# Patient Record
Sex: Male | Born: 1950
Health system: Southern US, Community
[De-identification: ages and names within clinical notes are randomized; demographics above are authoritative.]

## PROBLEM LIST (undated history)

## (undated) DIAGNOSIS — I4510 Unspecified right bundle-branch block: Secondary | ICD-10-CM

## (undated) DIAGNOSIS — I10 Essential (primary) hypertension: Secondary | ICD-10-CM

## (undated) DIAGNOSIS — I739 Peripheral vascular disease, unspecified: Secondary | ICD-10-CM

## (undated) DIAGNOSIS — M1990 Unspecified osteoarthritis, unspecified site: Secondary | ICD-10-CM

## (undated) DIAGNOSIS — G4730 Sleep apnea, unspecified: Secondary | ICD-10-CM

## (undated) DIAGNOSIS — N529 Male erectile dysfunction, unspecified: Secondary | ICD-10-CM

## (undated) DIAGNOSIS — M1710 Unilateral primary osteoarthritis, unspecified knee: Secondary | ICD-10-CM

## (undated) DIAGNOSIS — I2510 Atherosclerotic heart disease of native coronary artery without angina pectoris: Secondary | ICD-10-CM

## (undated) DIAGNOSIS — R972 Elevated prostate specific antigen [PSA]: Secondary | ICD-10-CM

## (undated) DIAGNOSIS — N201 Calculus of ureter: Secondary | ICD-10-CM

## (undated) DIAGNOSIS — Z794 Long term (current) use of insulin: Secondary | ICD-10-CM

## (undated) DIAGNOSIS — E119 Type 2 diabetes mellitus without complications: Secondary | ICD-10-CM

## (undated) DIAGNOSIS — N210 Calculus in bladder: Secondary | ICD-10-CM

## (undated) DIAGNOSIS — C649 Malignant neoplasm of unspecified kidney, except renal pelvis: Secondary | ICD-10-CM

## (undated) DIAGNOSIS — Z87442 Personal history of urinary calculi: Secondary | ICD-10-CM

## (undated) DIAGNOSIS — I480 Paroxysmal atrial fibrillation: Secondary | ICD-10-CM

## (undated) DIAGNOSIS — Z87448 Personal history of other diseases of urinary system: Secondary | ICD-10-CM

## (undated) DIAGNOSIS — I440 Atrioventricular block, first degree: Secondary | ICD-10-CM

## (undated) DIAGNOSIS — F419 Anxiety disorder, unspecified: Secondary | ICD-10-CM

## (undated) DIAGNOSIS — I428 Other cardiomyopathies: Secondary | ICD-10-CM

## (undated) DIAGNOSIS — Z973 Presence of spectacles and contact lenses: Secondary | ICD-10-CM

## (undated) DIAGNOSIS — I77810 Thoracic aortic ectasia: Secondary | ICD-10-CM

## (undated) DIAGNOSIS — R011 Cardiac murmur, unspecified: Secondary | ICD-10-CM

## (undated) DIAGNOSIS — R399 Unspecified symptoms and signs involving the genitourinary system: Secondary | ICD-10-CM

## (undated) DIAGNOSIS — C4371 Malignant melanoma of right lower limb, including hip: Secondary | ICD-10-CM

## (undated) DIAGNOSIS — E782 Mixed hyperlipidemia: Secondary | ICD-10-CM

## (undated) DIAGNOSIS — Z9889 Other specified postprocedural states: Secondary | ICD-10-CM

## (undated) DIAGNOSIS — I4892 Unspecified atrial flutter: Secondary | ICD-10-CM

## (undated) DIAGNOSIS — Z7901 Long term (current) use of anticoagulants: Secondary | ICD-10-CM

## (undated) DIAGNOSIS — Q6102 Congenital multiple renal cysts: Secondary | ICD-10-CM

## (undated) DIAGNOSIS — Z85820 Personal history of malignant melanoma of skin: Secondary | ICD-10-CM

## (undated) DIAGNOSIS — N401 Enlarged prostate with lower urinary tract symptoms: Secondary | ICD-10-CM

## (undated) DIAGNOSIS — Z789 Other specified health status: Secondary | ICD-10-CM

## (undated) HISTORY — DX: Unspecified atrial flutter: I48.92

## (undated) HISTORY — PX: TONSILLECTOMY: SUR1361

## (undated) HISTORY — DX: Long term (current) use of anticoagulants: Z79.01

## (undated) HISTORY — DX: Male erectile dysfunction, unspecified: N52.9

## (undated) HISTORY — PX: PROSTATE BIOPSY: SHX241

## (undated) HISTORY — DX: Other cardiomyopathies: I42.8

## (undated) HISTORY — PX: JOINT REPLACEMENT: SHX530

## (undated) HISTORY — DX: Unilateral primary osteoarthritis, unspecified knee: M17.10

## (undated) HISTORY — PX: KNEE SURGERY: SHX244

## (undated) HISTORY — PX: CARDIAC CATHETERIZATION: SHX172

## (undated) HISTORY — DX: Essential (primary) hypertension: I10

## (undated) HISTORY — PX: PILONIDAL CYST EXCISION: SHX744

## (undated) HISTORY — DX: Mixed hyperlipidemia: E78.2

---

## 2010-09-19 LAB — HM DIABETES EYE EXAM

## 2010-11-28 LAB — HM DIABETES EYE EXAM

## 2011-06-20 LAB — HM DIABETES FOOT EXAM

## 2011-07-20 ENCOUNTER — Ambulatory Visit (INDEPENDENT_AMBULATORY_CARE_PROVIDER_SITE_OTHER): Payer: BC Managed Care – PPO | Admitting: Endocrinology

## 2011-07-20 ENCOUNTER — Encounter: Payer: Self-pay | Admitting: Endocrinology

## 2011-07-20 DIAGNOSIS — E119 Type 2 diabetes mellitus without complications: Secondary | ICD-10-CM

## 2011-07-20 DIAGNOSIS — E109 Type 1 diabetes mellitus without complications: Secondary | ICD-10-CM

## 2011-07-20 MED ORDER — GLUCOSE BLOOD VI STRP: ORAL_STRIP | Status: AC

## 2011-07-20 MED ORDER — METFORMIN HCL ER 500 MG PO TB24: ORAL_TABLET | ORAL | Status: DC

## 2011-07-20 NOTE — Patient Instructions (Addendum)
good diet and exercise habits significanly improve the control of your diabetes.  please let me know if you wish to be referred to a dietician, or for weight-loss surgery.  high blood sugar is very risky to your health.  you should see an eye doctor every year. controlling your blood pressure and cholesterol drastically reduces the damage diabetes does to your body.  this also applies to quitting smoking.  please discuss these with your doctor.  you should take an aspirin every day, unless you have been advised by a doctor not to. check your blood sugar 1 time a day.  vary the time of day when you check, between before the 3 meals, and at bedtime.  also check if you have symptoms of your blood sugar being too high or too low.  please keep a record of the readings and bring it to your next appointment here.  please call us sooner if you are having low blood sugar episodes. For now, stop the [place], and re-try the metformin.  Here is a prescription.

## 2011-07-20 NOTE — Progress Notes (Signed)
Subjective:    Patient ID: Matthew Schmitt, male    DOB: [birth year], 60 y.o.   MRN: [id number]  HPI pt states 10 years h/o dm.  he is unaware of any chronic complications.  he has never been on insulin.  He wants to exhaust victoza and oral agents before considering insulin.  He did not tolerate metformin, due to nausea pt says his diet and exercise are "fair." Symptomatically, pt states many years of moderate discoloration of the legs, but no assoc swelling. Past Medical History  Diagnosis Date  . Impotence of organic origin   . Edema   . Essential hypertension, benign   . Type II or unspecified type diabetes mellitus without mention of complication, uncontrolled   . Diabetes mellitus   . Mixed hyperlipidemia   . Calculus of kidney   . Primary localized osteoarthrosis, lower leg   . Cardiac dysrhythmia, unspecified     Past Surgical History  Procedure Date  . Tonsillectomy 1968  . Knee surgery 1975, 1978    History   Social History  . Marital Status: Married    Spouse Name: N/A    Number of Children: N/A  . Years of Education: 16   Occupational History  . Emergency planning/management officer, Mining engineer    Social History Main Topics  . Smoking status: Former Smoker -- 1.0 packs/day for 10 years    Types: Cigarettes  . Smokeless tobacco: Not on file   Comment: quit 25 years ago  . Alcohol Use: Yes  . Drug Use: No  . Sexually Active: Not on file   Other Topics Concern  . Not on file   Social History Narrative   Regular exercise-no    No current outpatient prescriptions on file prior to visit.    Allergies  Allergen Reactions  . Metformin And Related     Family History  Problem Relation Age of Onset  . Arthritis Other     Parents  . Hypertension Other     Parents  . Diabetes Other     Parents  dm: both parents and 1 sib  BP 140/74  Pulse 85  Temp(Src) 98.2 F (36.8 C) (Oral)  Ht 6' (1.829 m)  Wt 272 lb 9.6 oz (123.651 kg)  BMI 36.97 kg/m2  SpO2  96%  Review of Systems denies blurry vision, headache, sob, n/v, urinary frequency, memory loss, depression, hypoglycemia, rhinorrhea, and easy bruising.  He has chronic low-back pain, leg cramps, and knee pain, excessive diaphoresis, and weight gain (he has since re-lost some of this).  He is about to have a treadmill test for chest pain.    Objective:   Physical Exam VS: see vs page GEN: no distress HEAD: head: no deformity eyes: no periorbital swelling, no proptosis external nose and ears are normal mouth: no lesion seen NECK: supple, thyroid is not enlarged CHEST WALL: no deformity LUNGS: clear to auscultatin CV: reg rate and rhythm, no murmur ABD: abdomen is soft, nontender.  no hepatosplenomegaly.  not distended.  no hernia MUSCULOSKELETAL: muscle bulk and strength are grossly normal.  no obvious joint swelling.  gait is normal and steady EXTEMITIES: no deformity.  no ulcer on the feet.  feet are of normal color and temp.  1+ bilat leg edema.  onychomycosis of toenails.  There is bilat leg rust-colored discoloration.   PULSES: dorsalis pedis intact bilat.  no carotid bruit NEURO:  cn 2-12 grossly intact.   readily moves all 4's.  sensation is intact to touch  on the feet SKIN:  Normal texture and temperature.  No rash or suspicious lesion is visible.   NODES:  None palpable at the neck. PSYCH: alert, oriented x3.  Does not appear anxious nor depressed.   outside test results are reviewed: A1c=9.3    Assessment & Plan:  Dm, needs increased rx Edema. This is a relative contraindication to insulin. Htn, with ? Of situational component

## 2011-07-23 ENCOUNTER — Encounter: Payer: Self-pay | Admitting: Endocrinology

## 2011-07-23 DIAGNOSIS — M1710 Unilateral primary osteoarthritis, unspecified knee: Secondary | ICD-10-CM | POA: Insufficient documentation

## 2011-07-23 DIAGNOSIS — E782 Mixed hyperlipidemia: Secondary | ICD-10-CM | POA: Insufficient documentation

## 2011-07-23 DIAGNOSIS — E785 Hyperlipidemia, unspecified: Secondary | ICD-10-CM | POA: Insufficient documentation

## 2011-08-15 ENCOUNTER — Ambulatory Visit
Admission: RE | Admit: 2011-08-15 | Discharge: 2011-08-15 | Disposition: A | Discharge status: Home / Self Care | Payer: BC Managed Care – PPO | Source: Ambulatory Visit | Attending: Cardiology | Admitting: Cardiology

## 2011-08-15 ENCOUNTER — Other Ambulatory Visit: Payer: Self-pay | Admitting: Cardiology

## 2011-08-15 DIAGNOSIS — R0789 Other chest pain: Secondary | ICD-10-CM

## 2011-08-17 ENCOUNTER — Inpatient Hospital Stay (HOSPITAL_BASED_OUTPATIENT_CLINIC_OR_DEPARTMENT_OTHER)
Admission: RE | Admit: 2011-08-17 | Discharge: 2011-08-17 | Disposition: A | Discharge status: Home / Self Care | Payer: BC Managed Care – PPO | Source: Ambulatory Visit | Attending: Cardiology | Admitting: Cardiology

## 2011-08-17 DIAGNOSIS — R0609 Other forms of dyspnea: Secondary | ICD-10-CM | POA: Insufficient documentation

## 2011-08-17 DIAGNOSIS — E119 Type 2 diabetes mellitus without complications: Secondary | ICD-10-CM | POA: Insufficient documentation

## 2011-08-17 DIAGNOSIS — I10 Essential (primary) hypertension: Secondary | ICD-10-CM | POA: Insufficient documentation

## 2011-08-17 DIAGNOSIS — R0989 Other specified symptoms and signs involving the circulatory and respiratory systems: Secondary | ICD-10-CM | POA: Insufficient documentation

## 2011-08-17 LAB — POCT I-STAT GLUCOSE
Glucose, Bld: 150 mg/dL — ABNORMAL HIGH (ref 70–99)
Operator id: 221371

## 2011-08-22 NOTE — Cardiovascular Report (Signed)
  NAMEMARJORIE, LUSSIER NO.:  [id number]  MEDICAL RECORD NO.:  [id number]  LOCATION:                                 FACILITY:  PHYSICIAN:  Georga Hacking, M.D.DATE OF BIRTH:  [birth year]  DATE OF PROCEDURE:  08/17/2011                            CARDIAC CATHETERIZATION   HISTORY:  A 60 year old male has a history of diabetes, hypertension which has not been well-controlled and has had bifascicular block on EKG.  Has had some dyspnea on exertion.  A Cardiolite stress test showed a fixed lateral wall defect with an EF of 42% and catheterization was advised to exclude coronary artery disease.  PROCEDURE:  Left heart catheterization with coronary angiograms and left ventriculogram.  COMMENTS ABOUT PROCEDURE:  The patient was brought to the outpatient catheterization laboratory and was prepped and draped in the usual manner.  After Xylocaine anesthesia, a 4-French sheath was placed in the right femoral artery percutaneously using a single anterior needle wall stick.  Angiograms were made using 4-French right and left catheters and a 30 mL ventriculogram was performed.  He tolerated the procedure well.  HEMODYNAMIC DATA:  Aorta postcontrast 165/77, LV postcontrast 165/15-20.  ANGIOGRAPHIC DATA:  Left ventriculogram:  Performed in the 30-degree RAO projection.  Mitral valve is normal.  The left ventricle appears dilated.  There appears to be global hypokinesis noted with an estimated ejection fraction of 40-45%.  Coronary arteries rise and distribute normally.  There is very minimal coronary calcification noted.  Left main coronary artery is normal.  Coronaries are large.  Left anterior descending has a large diagonal branch which bifurcates into 3 branches. It contains scattered irregularities.  Just after the bifurcation of the diagonal branch, appears to be an eccentric moderate LAD stenosis of 60%; however, there is streaming around this vessel despite high  blood flow, so it is hard to tell the severity of this lesion does not appear critical, however.  Circumflex coronary artery is a codominant vessel supplying a twin posterior descending artery.  A large first marginal branch arises.  There is no significant atherosclerotic disease involving the circumflex.  The right coronary artery ends in a posterior descending artery.  It is a large vessel and contains only scattered irregularities.  IMPRESSION: 1. Possible moderate left anterior descending stenosis just after the     bifurcation of a large diagonal branch. 2. Abnormal left ventricular function with an ejection fraction of 40-     45% mild global hypokinesis.  RECOMMENDATIONS:  Intensive blood pressure control, risk factor modification.     Georga Hacking, M.D.     WST/MEDQ  D:  08/17/2011  T:  08/17/2011  Job:  [id number]  cc:   Burnell Blanks, MD  Electronically Signed by Lacretia Nicks. Donnie Aho M.D. on 08/22/2011 12:45:38 PM

## 2011-10-26 ENCOUNTER — Other Ambulatory Visit (INDEPENDENT_AMBULATORY_CARE_PROVIDER_SITE_OTHER): Payer: BC Managed Care – PPO

## 2011-10-26 ENCOUNTER — Encounter: Payer: Self-pay | Admitting: Endocrinology

## 2011-10-26 ENCOUNTER — Ambulatory Visit (INDEPENDENT_AMBULATORY_CARE_PROVIDER_SITE_OTHER): Payer: BC Managed Care – PPO | Admitting: Endocrinology

## 2011-10-26 VITALS — BP 134/68 | HR 74 | Temp 98.7°F | Ht 72.0 in | Wt 261.0 lb

## 2011-10-26 DIAGNOSIS — E119 Type 2 diabetes mellitus without complications: Secondary | ICD-10-CM

## 2011-10-26 LAB — HEMOGLOBIN A1C: Hgb A1c MFr Bld: 6.3 % (ref 4.6–6.5)

## 2011-10-26 MED ORDER — METFORMIN HCL ER 500 MG PO TB24: ORAL_TABLET | ORAL | Status: DC

## 2011-10-26 NOTE — Progress Notes (Signed)
  Subjective:    Patient ID: Matthew Schmitt, male    DOB: 1951-09-24, 60 y.o.   MRN: [id number]  HPI    Review of Systems     Objective:   Physical Exam        Assessment & Plan:  Correction:  Should read "edema is a relative contraindication to actos"

## 2011-10-26 NOTE — Patient Instructions (Addendum)
check your blood sugar 1 time a day.  vary the time of day when you check, between before the 3 meals, and at bedtime.  also check if you have symptoms of your blood sugar being too high or too low.  please keep a record of the readings and bring it to your next appointment here.  please call us sooner if you are having low blood sugar episodes. Please sign a release of information for your October blood tests. Please come back for a follow-up appointment in 3-4 months. (update: i left message on phone-tree:  Good result.  Ret 6 mos)

## 2011-10-26 NOTE — Progress Notes (Signed)
Subjective:    Patient ID: Matthew Schmitt, male    DOB: Oct 04, 1951, 60 y.o.   MRN: [id number]  HPI Pt returns for f/u if type 2 DM (2002).  pt states he feels well in general.  He has lost weight, due to his efforts.  he brings a record of his cbg's which i have reviewed today, checked in am only.  It varies from 109-141.   Past Medical History  Diagnosis Date  . Impotence of organic origin   . Edema   . Type II or unspecified type diabetes mellitus without mention of complication, uncontrolled   . Cardiac dysrhythmia, unspecified   . Primary localized osteoarthrosis, lower leg   . Diabetes mellitus   . Mixed hyperlipidemia   . Essential hypertension, benign   . Calculus of kidney     Past Surgical History  Procedure Date  . Tonsillectomy 1968  . Knee surgery 1975, 1978    History   Social History  . Marital Status: Married    Spouse Name: N/A    Number of Children: N/A  . Years of Education: 16   Occupational History  . Emergency planning/management officer, Mining engineer    Social History Main Topics  . Smoking status: Former Smoker -- 1.0 packs/day for 10 years    Types: Cigarettes  . Smokeless tobacco: Not on file   Comment: quit 25 years ago  . Alcohol Use: Yes  . Drug Use: No  . Sexually Active: Not on file   Other Topics Concern  . Not on file   Social History Narrative   Regular exercise-no    Current Outpatient Prescriptions on File Prior to Visit  Medication Sig Dispense Refill  . aspirin (BAYER ASPIRIN EC LOW DOSE) 81 MG EC tablet Take 81 mg by mouth daily.        Marland Kitchen doxazosin (CARDURA) 8 MG tablet Take 8 mg by mouth daily.        Marland Kitchen glucose blood (ONE TOUCH ULTRA TEST) test strip 1 daily, and lancets 250.00  90 each  3  . Liraglutide (VICTOZA) 18 MG/3ML SOLN Inject 1.8 mg into the skin every evening.        . Tamsulosin HCl (FLOMAX) 0.4 MG CAPS Take 0.4 mg by mouth 2 (two) times a week.        . valsartan-hydrochlorothiazide (DIOVAN-HCT) 160-25 MG per tablet Take 1  tablet by mouth daily.          No Known Allergies  Family History  Problem Relation Age of Onset  . Arthritis Other     Parents  . Hypertension Other     Parents  . Diabetes Other     Parents    BP 134/68  Pulse 74  Temp(Src) 98.7 F (37.1 C) (Oral)  Ht 6' (1.829 m)  Wt 261 lb (118.389 kg)  BMI 35.40 kg/m2  SpO2 95%  Review of Systems Denies nausea.      Objective:   Physical Exam VITAL SIGNS:  See vs page GENERAL: no distress EXTEMITIES: no deformity.  no ulcer on the feet.  feet are of normal color and temp.  1+ bilat leg edema.  onychomycosis of toenails.  There is severe bilat leg rust-colored discoloration.   PULSES: dorsalis pedis intact bilat.   NEURO:  cn 2-12 grossly intact.   readily moves all 4's.  sensation is intact to touch on the feet   Lab Results  Component Value Date   HGBA1C 6.3 10/26/2011  Assessment & Plan:  DM, well-controlled

## 2012-01-24 ENCOUNTER — Ambulatory Visit (INDEPENDENT_AMBULATORY_CARE_PROVIDER_SITE_OTHER): Payer: BC Managed Care – PPO | Admitting: Endocrinology

## 2012-01-24 ENCOUNTER — Encounter: Payer: Self-pay | Admitting: Endocrinology

## 2012-01-24 VITALS — BP 138/70 | HR 64 | Temp 97.2°F | Ht 72.0 in | Wt 259.4 lb

## 2012-01-24 DIAGNOSIS — E119 Type 2 diabetes mellitus without complications: Secondary | ICD-10-CM

## 2012-01-24 NOTE — Patient Instructions (Addendum)
check your blood sugar 1 time a day.  vary the time of day when you check, between before the 3 meals, and at bedtime.  also check if you have symptoms of your blood sugar being too high or too low.  please keep a record of the readings and bring it to your next appointment here.  please call us sooner if you are having low blood sugar episodes. Please continue the same medications. Please come back for a follow-up appointment in 6 months.

## 2012-01-24 NOTE — Progress Notes (Signed)
Subjective:    Patient ID: Matthew Schmitt, male    DOB: April 23, 1951, 61 y.o.   MRN: [id number]  HPI Pt returns for f/u if type 2 DM (2002).  pt states he feels well in general.  no cbg record, but states cbg's are well-controlled.  He has lost a few lbs, due to his efforts.   Past Medical History  Diagnosis Date  . Impotence of organic origin   . Edema   . Type II or unspecified type diabetes mellitus without mention of complication, uncontrolled   . Cardiac dysrhythmia, unspecified   . Primary localized osteoarthrosis, lower leg   . Diabetes mellitus   . Mixed hyperlipidemia   . Essential hypertension, benign   . Calculus of kidney     Past Surgical History  Procedure Date  . Tonsillectomy 1968  . Knee surgery 1975, 1978    History   Social History  . Marital Status: Married    Spouse Name: N/A    Number of Children: N/A  . Years of Education: 16   Occupational History  . Emergency planning/management officer, Mining engineer    Social History Main Topics  . Smoking status: Former Smoker -- 1.0 packs/day for 10 years    Types: Cigarettes  . Smokeless tobacco: Not on file   Comment: quit 25 years ago  . Alcohol Use: Yes  . Drug Use: No  . Sexually Active: Not on file   Other Topics Concern  . Not on file   Social History Narrative   Regular exercise-no    Current Outpatient Prescriptions on File Prior to Visit  Medication Sig Dispense Refill  . aspirin (BAYER ASPIRIN EC LOW DOSE) 81 MG EC tablet Take 81 mg by mouth daily.        Marland Kitchen doxazosin (CARDURA) 8 MG tablet Take 8 mg by mouth daily.        Marland Kitchen glucose blood (ONE TOUCH ULTRA TEST) test strip 1 daily, and lancets 250.00  90 each  3  . Liraglutide (VICTOZA) 18 MG/3ML SOLN Inject 1.8 mg into the skin every evening.        . metFORMIN (GLUCOPHAGE-XR) 500 MG 24 hr tablet 4 tabs each morning  360 tablet  3  . metoprolol tartrate (LOPRESSOR) 25 MG tablet Take 25 mg by mouth 2 (two) times daily.        . pravastatin (PRAVACHOL) 40 MG  tablet Take 40 mg by mouth daily.        . Tamsulosin HCl (FLOMAX) 0.4 MG CAPS Take 0.4 mg by mouth 2 (two) times a week.        . valsartan-hydrochlorothiazide (DIOVAN-HCT) 160-25 MG per tablet Take 1 tablet by mouth daily.          No Known Allergies  Family History  Problem Relation Age of Onset  . Arthritis Other     Parents  . Hypertension Other     Parents  . Diabetes Other     Parents    BP 138/70  Pulse 64  Temp(Src) 97.2 F (36.2 C) (Oral)  Ht 6' (1.829 m)  Wt 259 lb 6 oz (117.652 kg)  BMI 35.18 kg/m2  SpO2 97%    Review of Systems denies hypoglycemia    Objective:   Physical Exam VITAL SIGNS:  See vs page GENERAL: no distress NECK: There is no palpable thyroid enlargement.  No thyroid nodule is palpable.  No palpable lymphadenopathy at the anterior neck.   outside test results are reviewed: A1c=6.2%  Assessment & Plan:  DM, well-controlled

## 2012-06-26 ENCOUNTER — Ambulatory Visit (INDEPENDENT_AMBULATORY_CARE_PROVIDER_SITE_OTHER): Payer: BC Managed Care – PPO | Admitting: Family Medicine

## 2012-06-26 VITALS — BP 152/71 | HR 74 | Temp 98.0°F | Resp 16 | Ht 71.5 in | Wt 261.0 lb

## 2012-06-26 DIAGNOSIS — R21 Rash and other nonspecific skin eruption: Secondary | ICD-10-CM

## 2012-06-26 MED ORDER — DOXYCYCLINE HYCLATE 100 MG PO TABS: 100.0000 mg | ORAL_TABLET | Freq: Two times a day (BID) | ORAL | Status: AC

## 2012-06-26 MED ORDER — VALACYCLOVIR HCL 1 G PO TABS: 1000.0000 mg | ORAL_TABLET | Freq: Three times a day (TID) | ORAL | Status: DC

## 2012-06-26 NOTE — Progress Notes (Signed)
[facility] [address] [place] [address] [phone]  Date:  06/26/2012   Name:  Matthew Schmitt   DOB:  01/14/1951   MRN:  [id number]  PCP:  Ailene Ravel, MD    Chief Complaint: ?cyst   History of Present Illness:  Matthew Schmitt is a 61 y.o. very pleasant male patient who presents with the following:  Here to evaluate a lesion on his left temple.  He noted a sore/ itchy area a few days ago, which has seemed to spread.  He was not sure if he might have a spider bite or some other type of infection.  otherwise he is feeling well, no fever/ chills/ ST.  He does not have any symptoms around the eye or on the eyelids, no tip of nose symptoms.    Patient Active Problem List  Diagnosis  . Primary localized osteoarthrosis, lower leg  . Mixed hyperlipidemia  . Essential hypertension, benign  . Calculus of kidney  . DM (diabetes mellitus)    Past Medical History  Diagnosis Date  . Impotence of organic origin   . Edema   . Type II or unspecified type diabetes mellitus without mention of complication, uncontrolled   . Cardiac dysrhythmia, unspecified   . Primary localized osteoarthrosis, lower leg   . Diabetes mellitus   . Mixed hyperlipidemia   . Essential hypertension, benign   . Calculus of kidney     Past Surgical History  Procedure Date  . Tonsillectomy 1968  . Knee surgery 1975, 1978    History  Substance Use Topics  . Smoking status: Former Smoker -- 1.0 packs/day for 10 years    Types: Cigarettes  . Smokeless tobacco: Not on file   Comment: quit 25 years ago  . Alcohol Use: Yes    Family History  Problem Relation Age of Onset  . Arthritis Other     Parents  . Hypertension Other     Parents  . Diabetes Other     Parents    No Known Allergies  Medication list has been reviewed and updated.  Current Outpatient Prescriptions on File Prior to Visit  Medication Sig Dispense Refill  . aspirin (BAYER ASPIRIN EC LOW DOSE) 81 MG EC  tablet Take 81 mg by mouth daily.        Marland Kitchen doxazosin (CARDURA) 8 MG tablet Take 8 mg by mouth daily.        Marland Kitchen glucose blood (ONE TOUCH ULTRA TEST) test strip 1 daily, and lancets 250.00  90 each  3  . Liraglutide (VICTOZA) 18 MG/3ML SOLN Inject 1.8 mg into the skin every evening.        . metFORMIN (GLUCOPHAGE-XR) 500 MG 24 hr tablet 4 tabs each morning  360 tablet  3  . metoprolol tartrate (LOPRESSOR) 25 MG tablet Take 25 mg by mouth 2 (two) times daily.        . potassium citrate (UROCIT-K) 10 MEQ (1080 MG) SR tablet Take 10 mEq by mouth daily.      . pravastatin (PRAVACHOL) 40 MG tablet Take 40 mg by mouth daily.        . Tamsulosin HCl (FLOMAX) 0.4 MG CAPS Take 0.4 mg by mouth 2 (two) times a week.        . valsartan-hydrochlorothiazide (DIOVAN-HCT) 160-25 MG per tablet Take 1 tablet by mouth daily.        Marland Kitchen spironolactone (ALDACTONE) 25 MG tablet Take 25 mg by mouth daily.  Review of Systems:  As per HPI- otherwise negative. He has no symptoms today except for the rash on his head  Physical Examination: Filed Vitals:   06/26/12 1422  BP: 152/71  Pulse: 74  Temp: 98 F (36.7 C)  Resp: 16   Filed Vitals:   06/26/12 1422  Height: 5' 11.5" (1.816 m)  Weight: 261 lb (118.389 kg)   Body mass index is 35.89 kg/(m^2). Ideal Body Weight: Weight in (lb) to have BMI = 25: 181.4   GEN: WDWN, NAD, Non-toxic, A & O x 3, obese HEENT: Atraumatic, Normocephalic. Neck supple. No masses, No LAD.  PEERL, EOMI.  No lesions on lids or around eyes.  Oropharynx wnl.  On the left temple (above ear, in the hairline) there are a few discrete clustered lesions- may be early vesicular.  No pustules, no fluctuance.   Ears and Nose: No external deformity. CV: RRR, No M/G/R. No JVD. No thrill. No extra heart sounds. PULM: CTA B, no wheezes, crackles, rhonchi. No retractions. No resp. distress. No accessory muscle use. EXTR: No c/c/e NEURO Normal gait.  PSYCH: Normally interactive. Conversant.  Not depressed or anxious appearing.  Calm demeanor.    Assessment and Plan: 1. Rash of face  valACYclovir (VALTREX) 1000 MG tablet, doxycycline (VIBRA-TABS) 100 MG tablet   Shingles vs other infection on the temple. Will treat as above, went over shingles care/ course/ prevention of transmission in detail.  His DIL is pregnant- he is to advise her as in pt instructions.  Let me know if not better in the next 2 or 3 days- Sooner if worse. If any lesions start to approach the eye or eyelid please seek help right away    [facility], MD

## 2012-06-26 NOTE — Patient Instructions (Signed)
Your daughter in law should let her OB- GYN know that she may have been exposed to shingles.  Has she been tested to be sure she is immune to the chicken pox virus?  If not they may want to perform this blood test.  Chances are that she IS immune- she likely had the chicken pox as a child

## 2012-07-24 ENCOUNTER — Other Ambulatory Visit (INDEPENDENT_AMBULATORY_CARE_PROVIDER_SITE_OTHER): Payer: BC Managed Care – PPO

## 2012-07-24 ENCOUNTER — Encounter: Payer: Self-pay | Admitting: Endocrinology

## 2012-07-24 ENCOUNTER — Ambulatory Visit (INDEPENDENT_AMBULATORY_CARE_PROVIDER_SITE_OTHER): Payer: BC Managed Care – PPO | Admitting: Endocrinology

## 2012-07-24 VITALS — BP 134/78 | HR 72 | Temp 97.9°F | Ht 71.5 in | Wt 265.0 lb

## 2012-07-24 DIAGNOSIS — B029 Zoster without complications: Secondary | ICD-10-CM | POA: Insufficient documentation

## 2012-07-24 DIAGNOSIS — E119 Type 2 diabetes mellitus without complications: Secondary | ICD-10-CM

## 2012-07-24 LAB — HEMOGLOBIN A1C: Hgb A1c MFr Bld: 6.9 % — ABNORMAL HIGH (ref 4.6–6.5)

## 2012-07-24 LAB — MICROALBUMIN / CREATININE URINE RATIO
Creatinine,U: 92.4 mg/dL
Microalb Creat Ratio: 2.8 mg/g (ref 0.0–30.0)
Microalb, Ur: 2.6 mg/dL — ABNORMAL HIGH (ref 0.0–1.9)

## 2012-07-24 MED ORDER — METFORMIN HCL ER 500 MG PO TB24: ORAL_TABLET | ORAL | Status: DC

## 2012-07-24 NOTE — Patient Instructions (Addendum)
check your blood sugar once a day.  vary the time of day when you check, between before the 3 meals, and at bedtime.  also check if you have symptoms of your blood sugar being too high or too low.  please keep a record of the readings and bring it to your next appointment here.  please call us sooner if you are having low blood sugar episodes.  blood tests are being requested for you today.  You will receive a letter with results.   Please come back for a follow-up appointment in 6 months.

## 2012-07-24 NOTE — Progress Notes (Signed)
Subjective:    Patient ID: Matthew Schmitt, male    DOB: 1951/11/09, 61 y.o.   MRN: [id number]  HPI Pt returns for f/u if type 2 DM (dx'ed 2002; no known complications).  pt states he feels well in general.  no cbg record, but states cbg's are well-controlled.   Past Medical History  Diagnosis Date  . Impotence of organic origin   . Edema   . Type II or unspecified type diabetes mellitus without mention of complication, uncontrolled   . Cardiac dysrhythmia, unspecified   . Primary localized osteoarthrosis, lower leg   . Diabetes mellitus   . Mixed hyperlipidemia   . Essential hypertension, benign   . Calculus of kidney     Past Surgical History  Procedure Date  . Tonsillectomy 1968  . Knee surgery 1975, 1978    History   Social History  . Marital Status: Married    Spouse Name: N/A    Number of Children: N/A  . Years of Education: 16   Occupational History  . Emergency planning/management officer, Mining engineer    Social History Main Topics  . Smoking status: Former Smoker -- 1.0 packs/day for 10 years    Types: Cigarettes  . Smokeless tobacco: Not on file   Comment: quit 25 years ago  . Alcohol Use: Yes  . Drug Use: No  . Sexually Active: Not on file   Other Topics Concern  . Not on file   Social History Narrative   Regular exercise-no    Current Outpatient Prescriptions on File Prior to Visit  Medication Sig Dispense Refill  . aspirin (BAYER ASPIRIN EC LOW DOSE) 81 MG EC tablet Take 81 mg by mouth daily.        Marland Kitchen doxazosin (CARDURA) 8 MG tablet Take 8 mg by mouth daily.        . Liraglutide (VICTOZA) 18 MG/3ML SOLN Inject 1.8 mg into the skin every evening.        . metoprolol tartrate (LOPRESSOR) 25 MG tablet Take 25 mg by mouth 2 (two) times daily.        . potassium citrate (UROCIT-K) 10 MEQ (1080 MG) SR tablet Take 40 mEq by mouth daily.       . pravastatin (PRAVACHOL) 40 MG tablet Take 40 mg by mouth daily.       Marland Kitchen spironolactone (ALDACTONE) 25 MG tablet Take 25 mg by  mouth daily.      . Tamsulosin HCl (FLOMAX) 0.4 MG CAPS Take 0.4 mg by mouth 2 (two) times a week.        . valACYclovir (VALTREX) 1000 MG tablet Take 1 tablet (1,000 mg total) by mouth 3 (three) times daily.  21 tablet  0  . valsartan-hydrochlorothiazide (DIOVAN-HCT) 160-25 MG per tablet Take 1 tablet by mouth daily.        Marland Kitchen DISCONTD: metFORMIN (GLUCOPHAGE-XR) 500 MG 24 hr tablet 4 tabs each morning  360 tablet  3    No Known Allergies  Family History  Problem Relation Age of Onset  . Arthritis Other     Parents  . Hypertension Other     Parents  . Diabetes Other     Parents    BP 134/78  Pulse 72  Temp 97.9 F (36.6 C) (Oral)  Ht 5' 11.5" (1.816 m)  Wt 265 lb (120.203 kg)  BMI 36.44 kg/m2  SpO2 97%  Review of Systems denies hypoglycemia.    Objective:   Physical Exam Pulses: dorsalis pedis intact bilat.  Feet: no deformity.  no ulcer on the feet.  feet are of normal color and temp.  Trace bilat leg edema.  There is rust-colored discoloration of the legs.  There is bilateral onychomycosis.   Neuro: sensation is intact to touch on the feet.  Lab Results  Component Value Date   HGBA1C 6.9* 07/24/2012      Assessment & Plan:  DM: well-controlled

## 2012-07-25 ENCOUNTER — Telehealth: Payer: Self-pay | Admitting: *Deleted

## 2012-07-25 NOTE — Telephone Encounter (Signed)
Called pt to inform of lab results, pt informed via VM and to callback office with any questions/concerns (letter also mailed to pt).  

## 2012-10-31 ENCOUNTER — Other Ambulatory Visit: Payer: Self-pay | Admitting: Urology

## 2012-10-31 ENCOUNTER — Encounter (HOSPITAL_BASED_OUTPATIENT_CLINIC_OR_DEPARTMENT_OTHER): Payer: Self-pay | Admitting: *Deleted

## 2012-10-31 NOTE — H&P (Signed)
Reason For Visit  Matthew Schmitt presented several months ago for routine followup. At that time he was having some mild left flank discomfort and ultrasound suggested potentially some mild hydro-. There was also an increase in number and size of his stones. We elected to put him on an increased dose of potassium citrate. He was in recently for a stone protocol CT to check on the status of his stones. Patient was noted at that time to a 12 mm stone in his distal left ureter with some left-sided hydronephrosis. Patient also has significant bilateral stone burden.   History of Present Illness      Matthew Schmitt  is currently 61 years of age. He has had recurrent uric acid nephrolithiasis and is known to have bilateral renal calculi right greater than left. He is passing very large 10 mm stone spontaneously in the past. He has never required any surgical intervention. He has been on and off potassium citrate due to compliance issues. Approximately 2-3 years ago in 2011 the patient had a large bladder stone that he did eventually pass spontaneously. Voiding ok. Takes Flomax 2x/ week. Recent PSA was 0.8.   Past Medical History Problems  1. History of  Arthritis V13.4 2. History of  Diabetes Mellitus 250.00 3. History of  Hypertension 401.9  Surgical History Problems  1. History of  Knee Surgery 2. History of  Procedures Of The Spine 3. History of  Tonsillectomy  Current Meds 1. Diovan HCT 160-12.5 MG Oral Tablet; Therapy: 01Dec2010 to 2. Doxazosin Mesylate 8 MG Oral Tablet; Therapy: 01Dec2010 to 3. Hydrocodone-Acetaminophen 5-500 MG Oral Tablet; Take by mouth 1 OR 2 tablets every 4-6  hours as needed for pain; Therapy: 11Dec2013 to (Last Rx:11Dec2013) 4. MetFORMIN HCl TABS; TAKE 1 TABLET DAILY WITH FOOD; Therapy: (Recorded:31Aug2012) to 5. Metoprolol Tartrate 25 MG Oral Tablet; Therapy: 14Sep2012 to 6. Potassium Citrate ER 10 MEQ (1080 MG) Oral Tablet Extended Release; 2BID - TAKE TWO  TABLETS BY MOUTH TWICE  DAILY; Therapy: 03Mar2011 to (Evaluate:28Dec2013)  Requested  for: 30Aug2013; Last Rx:30Aug2013 7. Pravastatin Sodium 40 MG Oral Tablet; Therapy: 14Sep2012 to 8. Tamsulosin HCl 0.4 MG Oral Capsule; TAKE ONE CAPSULE BY MOUTH EVERY DAY; Therapy:  27Dec2011 to (Evaluate:08Sep2013)  Requested for: 10Jun2013; Last Rx:10Jun2013 9. Victoza SOLN; Therapy: (Recorded:22Feb2013) to  Allergies Medication  1. No Known Drug Allergies  Family History Problems  1. Family history of  Family Health Status - Father's Age 79yrs 2. Family history of  Family Health Status - Mother's Age 52yrs, 3. Family history of  Family Health Status Number Of Children 2 sons  Social History Problems  1. Alcohol Use 2 beer a month 2. Caffeine Use 5-6 qd 3. Former Smoker nonsmoker for the past 20-41yrs 4. Marital History - Currently Married 5. Occupation: Photographer  Review of Systems Genitourinary, constitutional, skin, eye, otolaryngeal, hematologic/lymphatic, cardiovascular, pulmonary, endocrine, musculoskeletal, gastrointestinal, neurological and psychiatric system(s) were reviewed and pertinent findings if present are noted.  Genitourinary: feelings of urinary urgency, weak urinary stream and hematuria, but no dysuria and urinary stream does not start and stop.  Gastrointestinal: nausea, flank pain and abdominal pain.  Musculoskeletal: back pain and joint pain.    Vitals Vital Signs [Data Includes: Last 1 Day]  13Dec2013 08:44AM  Blood Pressure: 155 / 82 Temperature: 98.9 F Heart Rate: 80  Physical Exam Constitutional: Well nourished Amended By: Barron Alvine; 10/31/2012 2:42 PMEST  and well developed Amended By: Barron Alvine; 10/31/2012 2:42 PMEST . No acute distress. Amended By:  Harun, Brumley; 10/31/2012 2:42 PMEST.  Neck: The appearance of the neck is normal Amended By: Barron Alvine; 10/31/2012 2:42 PMEST  and no neck mass is present Amended By: Barron Alvine; 10/31/2012 2:42 PMEST.  Pulmonary:  No respiratory distress Amended By: Barron Alvine; 10/31/2012 2:42 PMEST  and normal respiratory rhythm and effort Amended By: Barron Alvine; 10/31/2012 2:42 PMEST.  Cardiovascular: Heart rate and rhythm are normal Amended By: Barron Alvine; 10/31/2012 2:42 PMEST . No peripheral edema. Amended By: Barron Alvine; 10/31/2012 2:42 PMEST.  Abdomen: The abdomen is soft and nontender Amended By: Barron Alvine; 10/31/2012 2:42 PMEST No masses are palpated Amended By: Barron Alvine; 10/31/2012 2:42 PMEST No CVA tenderness Amended By: Barron Alvine; 10/31/2012 2:42 PMEST. No hernias are palpable Amended By: Barron Alvine; 10/31/2012 2:42 PMEST No hepatosplenomegaly noted Amended By: Barron Alvine; 10/31/2012 2:42 PMEST    Results/Data Urine Tempie Donning Includes: Last 1 Day]   13Dec2013  COLOR YELLOW   APPEARANCE CLEAR   SPECIFIC GRAVITY 1.025   pH 5.5   GLUCOSE NEG mg/dL  BILIRUBIN NEG   KETONE NEG mg/dL  BLOOD MOD   PROTEIN NEG mg/dL  UROBILINOGEN 0.2 mg/dL  NITRITE NEG   LEUKOCYTE ESTERASE NEG   SQUAMOUS EPITHELIAL/HPF RARE   WBC 3-6 WBC/hpf  RBC 3-6 RBC/hpf  BACTERIA FEW   CRYSTALS NONE SEEN   CASTS NONE SEEN   Other Amorphous noted    Assessment Assessed  1. Bladder Calculus 594.1 2. Nephrolithiasis 592.0 3. Ureteral Stone 592.1  Plan Health Maintenance (V70.0)  1. UA With REFLEX  Done: 13Dec2013 08:29AM  Discussion/Summary   Bharat has a substantial bilateral stone burden. The most concerning findings currently is a very large 12 mm stone in his distal left ureter. There may be an additional 5 mm stone at his ureterovesical junction, but this calcification may actually be in his bladder. He has passed some rather large ureteral calculi in the past, but obviously has a very low likelihood of spontaneous passage of a stone this size. I do not see a lot of advantage in trying to get him to pass this spontaneously. My recommended treatment for this would be ureteroscopy with Holmium  lithotripsy since stone is difficult to visualize and would be potentially hard to treat with ESWL. I would anticipate trying to get him on the schedule for some time next week if at all possible. I do think he will probably require several days of JJ stent placement status post the procedure.       Signatures Electronically signed by : Barron Alvine, M.D.; Oct 31 2012  2:43PM

## 2012-10-31 NOTE — Progress Notes (Signed)
NPO AFTER MN WITH EXCEPTIONS WATER/ GATORADE/ BLACK COFFEE (NO CREAM/ MILK PRODUCTS) UNTIL 0730. ARRIVES AT 1215. NEEDS ISTAT AND EKG. WILL TAKE METOPROLOL AND IF NEEDED HYDOCODONE AM OF SURG W/ SIPS OF WATER. PT ADVISED IF PAIN UNCONTROLLED W/ MED. MAY COME IN EARLIER.

## 2012-11-03 ENCOUNTER — Encounter (HOSPITAL_BASED_OUTPATIENT_CLINIC_OR_DEPARTMENT_OTHER): Payer: Self-pay | Admitting: Anesthesiology

## 2012-11-03 ENCOUNTER — Encounter (HOSPITAL_BASED_OUTPATIENT_CLINIC_OR_DEPARTMENT_OTHER)
Admission: RE | Disposition: A | Discharge status: Home / Self Care | Payer: Self-pay | Source: Ambulatory Visit | Attending: Urology

## 2012-11-03 ENCOUNTER — Ambulatory Visit (HOSPITAL_BASED_OUTPATIENT_CLINIC_OR_DEPARTMENT_OTHER)
Admission: RE | Admit: 2012-11-03 | Discharge: 2012-11-03 | Disposition: A | Discharge status: Home / Self Care | Payer: BC Managed Care – PPO | Source: Ambulatory Visit | Attending: Urology | Admitting: Urology

## 2012-11-03 ENCOUNTER — Ambulatory Visit (HOSPITAL_BASED_OUTPATIENT_CLINIC_OR_DEPARTMENT_OTHER): Payer: BC Managed Care – PPO | Admitting: Anesthesiology

## 2012-11-03 ENCOUNTER — Encounter (HOSPITAL_BASED_OUTPATIENT_CLINIC_OR_DEPARTMENT_OTHER): Payer: Self-pay | Admitting: *Deleted

## 2012-11-03 ENCOUNTER — Encounter (HOSPITAL_BASED_OUTPATIENT_CLINIC_OR_DEPARTMENT_OTHER): Payer: Self-pay

## 2012-11-03 DIAGNOSIS — N201 Calculus of ureter: Secondary | ICD-10-CM

## 2012-11-03 DIAGNOSIS — N200 Calculus of kidney: Secondary | ICD-10-CM | POA: Insufficient documentation

## 2012-11-03 DIAGNOSIS — E119 Type 2 diabetes mellitus without complications: Secondary | ICD-10-CM | POA: Insufficient documentation

## 2012-11-03 DIAGNOSIS — Z79899 Other long term (current) drug therapy: Secondary | ICD-10-CM | POA: Insufficient documentation

## 2012-11-03 DIAGNOSIS — I10 Essential (primary) hypertension: Secondary | ICD-10-CM | POA: Insufficient documentation

## 2012-11-03 DIAGNOSIS — N210 Calculus in bladder: Secondary | ICD-10-CM | POA: Insufficient documentation

## 2012-11-03 HISTORY — DX: Unspecified right bundle-branch block: I45.10

## 2012-11-03 HISTORY — DX: Atherosclerotic heart disease of native coronary artery without angina pectoris: I25.10

## 2012-11-03 HISTORY — PX: HOLMIUM LASER APPLICATION: SHX5852

## 2012-11-03 HISTORY — PX: CYSTOSCOPY WITH RETROGRADE PYELOGRAM, URETEROSCOPY AND STENT PLACEMENT: SHX5789

## 2012-11-03 HISTORY — DX: Personal history of urinary calculi: Z87.442

## 2012-11-03 LAB — POCT I-STAT 4, (NA,K, GLUC, HGB,HCT)
Glucose, Bld: 160 mg/dL — ABNORMAL HIGH (ref 70–99)
HCT: 38 % — ABNORMAL LOW (ref 39.0–52.0)
Hemoglobin: 12.9 g/dL — ABNORMAL LOW (ref 13.0–17.0)
Potassium: 3.7 mEq/L (ref 3.5–5.1)
Sodium: 139 mEq/L (ref 135–145)

## 2012-11-03 LAB — GLUCOSE, CAPILLARY: Glucose-Capillary: 157 mg/dL — ABNORMAL HIGH (ref 70–99)

## 2012-11-03 SURGERY — CYSTOURETEROSCOPY, WITH RETROGRADE PYELOGRAM AND STENT INSERTION
Anesthesia: General | Laterality: Left | Wound class: Clean Contaminated

## 2012-11-03 MED ORDER — MIDAZOLAM HCL 5 MG/5ML IJ SOLN
INTRAMUSCULAR | Status: DC | PRN
  Administered 2012-11-03: 2 mg via INTRAVENOUS

## 2012-11-03 MED ORDER — DEXAMETHASONE SODIUM PHOSPHATE 4 MG/ML IJ SOLN: INTRAMUSCULAR | Status: DC | PRN

## 2012-11-03 MED ORDER — FENTANYL CITRATE 0.05 MG/ML IJ SOLN
INTRAMUSCULAR | Status: DC | PRN
  Administered 2012-11-03: 25 ug via INTRAVENOUS
  Administered 2012-11-03: 100 ug via INTRAVENOUS
  Administered 2012-11-03: 25 ug via INTRAVENOUS

## 2012-11-03 MED ORDER — DEXAMETHASONE SODIUM PHOSPHATE 4 MG/ML IJ SOLN
INTRAMUSCULAR | Status: DC | PRN
  Administered 2012-11-03: 4 mg via INTRAVENOUS

## 2012-11-03 MED ORDER — SODIUM CHLORIDE 0.9 % IR SOLN
Status: DC | PRN
  Administered 2012-11-03: 9000 mL

## 2012-11-03 MED ORDER — BELLADONNA ALKALOIDS-OPIUM 16.2-60 MG RE SUPP
RECTAL | Status: DC | PRN
  Administered 2012-11-03: 1 via RECTAL

## 2012-11-03 MED ORDER — URIBEL 118 MG PO CAPS: 1.0000 | ORAL_CAPSULE | Freq: Three times a day (TID) | ORAL | Status: DC | PRN

## 2012-11-03 MED ORDER — KETOROLAC TROMETHAMINE 30 MG/ML IJ SOLN
INTRAMUSCULAR | Status: DC | PRN
  Administered 2012-11-03: 30 mg via INTRAVENOUS

## 2012-11-03 MED ORDER — LACTATED RINGERS IV SOLN
INTRAVENOUS | Status: DC
  Administered 2012-11-03 (×2): via INTRAVENOUS
  Filled 2012-11-03: qty 1000

## 2012-11-03 MED ORDER — LIDOCAINE HCL 2 % EX GEL
CUTANEOUS | Status: DC | PRN
  Administered 2012-11-03: 1 via URETHRAL

## 2012-11-03 MED ORDER — URELLE 81 MG PO TABS
1.0000 | ORAL_TABLET | Freq: Four times a day (QID) | ORAL | Status: DC
  Administered 2012-11-03: 81 mg via ORAL
  Filled 2012-11-03: qty 1

## 2012-11-03 MED ORDER — FENTANYL CITRATE 0.05 MG/ML IJ SOLN
25.0000 ug | INTRAMUSCULAR | Status: DC | PRN
  Filled 2012-11-03: qty 1

## 2012-11-03 MED ORDER — CIPROFLOXACIN IN D5W 400 MG/200ML IV SOLN
400.0000 mg | INTRAVENOUS | Status: AC
  Administered 2012-11-03: 400 mg via INTRAVENOUS
  Filled 2012-11-03: qty 200

## 2012-11-03 MED ORDER — PROPOFOL 10 MG/ML IV BOLUS
INTRAVENOUS | Status: DC | PRN
  Administered 2012-11-03: 200 mg via INTRAVENOUS

## 2012-11-03 MED ORDER — IOHEXOL 350 MG/ML SOLN
INTRAVENOUS | Status: DC | PRN
  Administered 2012-11-03: 20 mL via INTRAVENOUS

## 2012-11-03 MED ORDER — LIDOCAINE HCL (CARDIAC) 20 MG/ML IV SOLN
INTRAVENOUS | Status: DC | PRN
  Administered 2012-11-03: 60 mg via INTRAVENOUS

## 2012-11-03 MED ORDER — ONDANSETRON HCL 4 MG/2ML IJ SOLN
INTRAMUSCULAR | Status: DC | PRN
  Administered 2012-11-03: 4 mg via INTRAVENOUS

## 2012-11-03 MED ORDER — PROMETHAZINE HCL 25 MG/ML IJ SOLN
6.2500 mg | INTRAMUSCULAR | Status: DC | PRN
  Filled 2012-11-03: qty 1

## 2012-11-03 SURGICAL SUPPLY — 39 items
ADAPTER CATH URET PLST 4-6FR (CATHETERS) ×1 IMPLANT
ADPR CATH URET STRL DISP 4-6FR (CATHETERS) ×1
APL SKNCLS STERI-STRIP NONHPOA (GAUZE/BANDAGES/DRESSINGS)
BAG DRAIN URO-CYSTO SKYTR STRL (DRAIN) ×2 IMPLANT
BAG DRN UROCATH (DRAIN) ×1
BASKET LASER NITINOL 1.9FR (BASKET) IMPLANT
BASKET STNLS GEMINI 4WIRE 3FR (BASKET) IMPLANT
BASKET ZERO TIP NITINOL 2.4FR (BASKET) ×2 IMPLANT
BENZOIN TINCTURE PRP APPL 2/3 (GAUZE/BANDAGES/DRESSINGS) IMPLANT
BSKT STON RTRVL 120 1.9FR (BASKET)
BSKT STON RTRVL GEM 120X11 3FR (BASKET)
BSKT STON RTRVL ZERO TP 2.4FR (BASKET) ×1
CANISTER SUCT LVC 12 LTR MEDI- (MISCELLANEOUS) ×1 IMPLANT
CATH INTERMIT  6FR 70CM (CATHETERS) ×1 IMPLANT
CATH URET 5FR 28IN CONE TIP (BALLOONS)
CATH URET 5FR 28IN OPEN ENDED (CATHETERS) IMPLANT
CATH URET 5FR 70CM CONE TIP (BALLOONS) IMPLANT
CLOTH BEACON ORANGE TIMEOUT ST (SAFETY) ×2 IMPLANT
DRAPE CAMERA CLOSED 9X96 (DRAPES) ×2 IMPLANT
DRSG TEGADERM 2-3/8X2-3/4 SM (GAUZE/BANDAGES/DRESSINGS) IMPLANT
GLOVE BIO SURGEON STRL SZ7 (GLOVE) ×1 IMPLANT
GLOVE BIO SURGEON STRL SZ7.5 (GLOVE) ×3 IMPLANT
GLOVE INDICATOR 7.5 STRL GRN (GLOVE) ×1 IMPLANT
GOWN STRL REIN XL XLG (GOWN DISPOSABLE) ×3 IMPLANT
GUIDEWIRE 0.038 PTFE COATED (WIRE) IMPLANT
GUIDEWIRE ANG ZIPWIRE 038X150 (WIRE) IMPLANT
GUIDEWIRE STR DUAL SENSOR (WIRE) ×1 IMPLANT
IV NS IRRIG 3000ML ARTHROMATIC (IV SOLUTION) ×4 IMPLANT
KIT BALLIN UROMAX 15FX10 (LABEL) IMPLANT
KIT BALLN UROMAX 15FX4 (MISCELLANEOUS) IMPLANT
KIT BALLN UROMAX 26 75X4 (MISCELLANEOUS)
LASER FIBER DISP (UROLOGICAL SUPPLIES) ×2 IMPLANT
LASER FIBER DISP 1000U (UROLOGICAL SUPPLIES) IMPLANT
NS IRRIG 500ML POUR BTL (IV SOLUTION) ×1 IMPLANT
PACK CYSTOSCOPY (CUSTOM PROCEDURE TRAY) ×2 IMPLANT
SET HIGH PRES BAL DIL (LABEL)
SHEATH ACCESS URETERAL 38CM (SHEATH) IMPLANT
SHEATH ACCESS URETERAL 54CM (SHEATH) IMPLANT
STENT CONTOUR 7FRX24 (STENTS) ×1 IMPLANT

## 2012-11-03 NOTE — Anesthesia Procedure Notes (Signed)
Procedure Name: LMA Insertion Date/Time: 11/03/2012 1:58 PM Performed by: Renella Cunas D Pre-anesthesia Checklist: Patient identified, Emergency Drugs available, Suction available and Patient being monitored Patient Re-evaluated:Patient Re-evaluated prior to inductionOxygen Delivery Method: Circle System Utilized Preoxygenation: Pre-oxygenation with 100% oxygen Intubation Type: IV induction Ventilation: Mask ventilation without difficulty LMA: LMA inserted LMA Size: 5.0 Number of attempts: 1 Airway Equipment and Method: bite block Placement Confirmation: positive ETCO2 Tube secured with: Tape Dental Injury: Teeth and Oropharynx as per pre-operative assessment

## 2012-11-03 NOTE — Op Note (Signed)
Preoperative diagnosis: 13 mm left ureteral calculus  Postoperative diagnosis: same  Procedure:  1. Cystoscopy 2. left ureteroscopy and stone removal 3. Ureteroscopic laser lithotripsy 4. left ureteral stent placement (7 [place]) 24 cm 5. left retrograde pyelography with interpretation  Surgeon: Valetta Fuller, MD  Anesthesia: General  Complications: None  Intraoperative findings:  left retrograde pyelography demonstrated a filling defect within the left distal ureter consistent with the patient's known calculus without other abnormalities.  EBL: Minimal  Specimens: 1. Fragments left ureteral calculus  Disposition of specimens: [facility] for stone analysis  Indication: Matthew Schmitt is a 61 y.o.   patient with urolithiasis. After reviewing the management options for treatment, the patient elected to proceed with the above surgical procedure(s). We have discussed the potential benefits and risks of the procedure, side effects of the proposed treatment, the likelihood of the patient achieving the goals of the procedure, and any potential problems that might occur during the procedure or recuperation. Informed consent has been obtained.  Description of procedure:  The patient was taken to the operating room and general anesthesia was induced.  The patient was placed in the dorsal lithotomy position, prepped and draped in the usual sterile fashion, and preoperative antibiotics were administered. A preoperative time-out was performed.   Cystourethroscopy was performed.  The patient's urethra was examined and was normal. He is a one and a The bladder was then systematically examined in its entirety. There was no evidence for any bladder tumors, stones, or other mucosal pathology.    Attention then turned to the left ureteral orifice and a ureteral catheter was used to intubate the ureteral orifice.  Omnipaque contrast was injected through the ureteral catheter and a  retrograde pyelogram was performed with findings as dictated above.  A 0.38 sensor guidewire was then advanced up the ureter into the renal pelvis under fluoroscopic guidance. The 6 Fr semirigid ureteroscope was then advanced into the ureter next to the guidewire and the calculus was identified.   The stone was then fragmented with the 365 micron holmium laser fiber on a setting of 0.5J and frequency of 10 Hz.   All stones were then removed from the ureter with a zero tip nitinol basket.  Reinspection of the ureter revealed no remaining visible stones or fragments.   The wire was then backloaded through the cystoscope and a ureteral stent was advance over the wire using Seldinger technique.  The stent was positioned appropriately under fluoroscopic and cystoscopic guidance.  The wire was then removed with an adequate stent curl noted in the renal pelvis as well as in the bladder.  The bladder was then emptied and the procedure ended.  The patient appeared to tolerate the procedure well and without complications.  The patient was able to be awakened and transferred to the recovery unit in satisfactory condition.

## 2012-11-03 NOTE — Anesthesia Postprocedure Evaluation (Signed)
Anesthesia Post Note  Patient: Matthew Schmitt  Procedure(s) Performed: Procedure(s) (LRB): CYSTOSCOPY WITH RETROGRADE PYELOGRAM, URETEROSCOPY AND STENT PLACEMENT (Left) HOLMIUM LASER APPLICATION (Left)  Anesthesia type: General  Patient location: PACU  Post pain: Pain level controlled  Post assessment: Post-op Vital signs reviewed  Last Vitals: BP 168/75  Pulse 66  Temp 36.2 C (Oral)  Resp 13  Ht 5' 11.5" (1.816 m)  Wt 265 lb (120.203 kg)  BMI 36.44 kg/m2  SpO2 89%  Post vital signs: Reviewed  Level of consciousness: sedated  Complications: No apparent anesthesia complications

## 2012-11-03 NOTE — Interval H&P Note (Signed)
History and Physical Interval Note:  11/03/2012 1:59 PM  Matthew Schmitt  has presented today for surgery, with the diagnosis of Left Ureteral Calculus  The various methods of treatment have been discussed with the patient and family. After consideration of risks, benefits and other options for treatment, the patient has consented to  Procedure(s) (LRB) with comments: CYSTOSCOPY WITH RETROGRADE PYELOGRAM, URETEROSCOPY AND STENT PLACEMENT (Left) - Cystoscopy/Left Retrograde/Ureteroscopy/Holmium Laser Litho/Double J Stent as a surgical intervention .  The patient's history has been reviewed, patient examined, no change in status, stable for surgery.  I have reviewed the patient's chart and labs.  Questions were answered to the patient's satisfaction.     Afsheen Antony,Bird S

## 2012-11-03 NOTE — Anesthesia Preprocedure Evaluation (Addendum)
Anesthesia Evaluation  Patient identified by MRN, date of birth, ID band Patient awake    Reviewed: Allergy & Precautions, H&P , NPO status , Patient's Chart, lab work & pertinent test results, reviewed documented beta blocker date and time   Airway Mallampati: II TM Distance: >3 FB Neck ROM: full    Dental No notable dental hx.    Pulmonary neg pulmonary ROS,  breath sounds clear to auscultation  Pulmonary exam normal       Cardiovascular Exercise Tolerance: Good hypertension, On Medications and On Home Beta Blockers Rhythm:regular Rate:Normal  H/O bifascicular block. Reviewed Dr. York Spaniel note from late 2012. 08/17/11. LVEF 40-45%.  Cath at that time reviewed. No current cardiopulmonary symptoms.   Neuro/Psych PSYCHIATRIC DISORDERS negative neurological ROS     GI/Hepatic negative GI ROS, Neg liver ROS,   Endo/Other  diabetes, Type 2, Oral Hypoglycemic Agents  Renal/GU Renal disease  negative genitourinary   Musculoskeletal   Abdominal (+) + obese,   Peds  Hematology negative hematology ROS (+)   Anesthesia Other Findings   Reproductive/Obstetrics negative OB ROS                         Anesthesia Physical Anesthesia Plan  ASA: III  Anesthesia Plan: General   Post-op Pain Management:    Induction: Intravenous  Airway Management Planned: LMA  Additional Equipment:   Intra-op Plan:   Post-operative Plan: Extubation in OR  Informed Consent: I have reviewed the patients History and Physical, chart, labs and discussed the procedure including the risks, benefits and alternatives for the proposed anesthesia with the patient or authorized representative who has indicated his/her understanding and acceptance.   Dental Advisory Given  Plan Discussed with: CRNA  Anesthesia Plan Comments:         Anesthesia Quick Evaluation

## 2012-11-03 NOTE — Transfer of Care (Signed)
Immediate Anesthesia Transfer of Care Note  Patient: Lenox Bink  Procedure(s) Performed: Procedure(s) (LRB): CYSTOSCOPY WITH RETROGRADE PYELOGRAM, URETEROSCOPY AND STENT PLACEMENT (Left) HOLMIUM LASER APPLICATION (Left)  Patient Location: PACU  Anesthesia Type: General  Level of Consciousness: awake, oriented, sedated and patient cooperative  Airway & Oxygen Therapy: Patient Spontanous Breathing and Patient connected to face mask oxygen  Post-op Assessment: Report given to PACU RN and Post -op Vital signs reviewed and stable  Post vital signs: Reviewed and stable  Complications: No apparent anesthesia complications

## 2012-11-04 ENCOUNTER — Encounter (HOSPITAL_BASED_OUTPATIENT_CLINIC_OR_DEPARTMENT_OTHER): Payer: Self-pay | Admitting: Urology

## 2012-11-19 DIAGNOSIS — C801 Malignant (primary) neoplasm, unspecified: Secondary | ICD-10-CM

## 2012-11-19 HISTORY — DX: Malignant (primary) neoplasm, unspecified: C80.1

## 2013-01-22 ENCOUNTER — Ambulatory Visit (INDEPENDENT_AMBULATORY_CARE_PROVIDER_SITE_OTHER): Payer: BC Managed Care – PPO | Admitting: Endocrinology

## 2013-01-22 ENCOUNTER — Encounter: Payer: Self-pay | Admitting: Endocrinology

## 2013-01-22 VITALS — BP 136/80 | HR 80 | Wt 264.0 lb

## 2013-01-22 DIAGNOSIS — E119 Type 2 diabetes mellitus without complications: Secondary | ICD-10-CM

## 2013-01-22 MED ORDER — GLUCOSE BLOOD VI STRP: 1.0000 | ORAL_STRIP | Freq: Every day | Status: DC

## 2013-01-22 MED ORDER — LIRAGLUTIDE 18 MG/3ML ~~LOC~~ SOLN: 1.8000 mg | Freq: Every evening | SUBCUTANEOUS | Status: DC

## 2013-01-22 MED ORDER — BROMOCRIPTINE MESYLATE 2.5 MG PO TABS: 2.5000 mg | ORAL_TABLET | Freq: Every day | ORAL | Status: DC

## 2013-01-22 NOTE — Patient Instructions (Addendum)
check your blood sugar once a day.  vary the time of day when you check, between before the 3 meals, and at bedtime.  also check if you have symptoms of your blood sugar being too high or too low.  please keep a record of the readings and bring it to your next appointment here.  please call us sooner if you are having low blood sugar episodes, or if it is running above 150.  If this happens, we can add "invokana." Please come back for a follow-up appointment in 3 months.   Please add "bromocriptine," to help your blood sugar. It has possible side effects of nausea and dizziness.  These go away with time.  You can avoid these by taking it at bedtime, and by taking just take 1/2 pill for the first week.

## 2013-01-22 NOTE — Progress Notes (Signed)
Subjective:    Patient ID: Matthew Schmitt, male    DOB: 04/02/1951, 62 y.o.   MRN: [id number]  HPI Pt returns for f/u if type 2 DM (dx'ed 2002; no known complications).  pt states he feels well in general.  no cbg record, but states cbg's have increased to approx 200).   Past Medical History  Diagnosis Date  . Impotence of organic origin   . Mixed hyperlipidemia   . Essential hypertension, benign   . Type II or unspecified type diabetes mellitus without mention of complication, uncontrolled   . Left ureteral calculus   . Arthritis of both knees   . History of kidney stones   . Renal calculi bilateral non-obstructive  . RBBB   . Coronary artery disease     Past Surgical History  Procedure Laterality Date  . Knee surgery  1978  &  1974    BILATERAL  . Cardiac catheterization  08-17-2011  DR SPENCER TILLEY    POSSIBLE MODERATE LAD STENOSIS JUST AFTER THE BIFURCATION OF LARGE DIAGONAL BRANCH/ LVEF  40-45%/ MILD GLOBAL HYPODINESIS (CATH DONE FOR ABNORMAL STRESS TEST OF FIXED LATERAL WALL DEFECT & BIFASCICULAR BLAOCK)  . Tonsillectomy  28  (age 34)  . Cystoscopy with retrograde pyelogram, ureteroscopy and stent placement  11/03/2012    Procedure: CYSTOSCOPY WITH RETROGRADE PYELOGRAM, URETEROSCOPY AND STENT PLACEMENT;  Surgeon: Valetta Fuller, MD;  Location: [facility];  Service: Urology;  Laterality: Left;  Cystoscopy/Left Retrograde/Ureteroscopy/Holmium Laser Litho/Double J Stent  . Holmium laser application  11/03/2012    Procedure: HOLMIUM LASER APPLICATION;  Surgeon: Valetta Fuller, MD;  Location: [facility];  Service: Urology;  Laterality: Left;    History   Social History  . Marital Status: Married    Spouse Name: N/A    Number of Children: N/A  . Years of Education: 16   Occupational History  . Emergency planning/management officer, Mining engineer    Social History Main Topics  . Smoking status: Former Smoker -- 1.00 packs/day for 6 years    Types:  Cigarettes    Quit date: 10/31/1969  . Smokeless tobacco: Not on file     Comment: QUIT SMOKING IN 1970'S  . Alcohol Use: Yes     Comment: OCCASIONAL  . Drug Use: No  . Sexually Active: Not on file   Other Topics Concern  . Not on file   Social History Narrative   Regular exercise-no    Current Outpatient Prescriptions on File Prior to Visit  Medication Sig Dispense Refill  . aspirin (BAYER ASPIRIN EC LOW DOSE) 81 MG EC tablet Take 81 mg by mouth daily.       Marland Kitchen doxazosin (CARDURA) 8 MG tablet Take 8 mg by mouth daily.       Marland Kitchen HYDROcodone-acetaminophen (NORCO/VICODIN) 5-325 MG per tablet Take 1 tablet by mouth every 6 (six) hours as needed.      . metFORMIN (GLUCOPHAGE-XR) 500 MG 24 hr tablet Take 2,000 mg by mouth daily with breakfast. 4 tabs each morning      . Meth-Hyo-M Bl-Na Phos-Ph Sal (URIBEL) 118 MG CAPS Take 1 capsule (118 mg total) by mouth 3 (three) times daily as needed.  20 capsule  1  . metoprolol tartrate (LOPRESSOR) 25 MG tablet Take 25 mg by mouth 2 (two) times daily.       . potassium citrate (UROCIT-K) 10 MEQ (1080 MG) SR tablet Take 20 mEq by mouth daily.       Marland Kitchen  pravastatin (PRAVACHOL) 40 MG tablet Take 40 mg by mouth every evening.       Marland Kitchen spironolactone (ALDACTONE) 25 MG tablet Take 25 mg by mouth daily.      . Tamsulosin HCl (FLOMAX) 0.4 MG CAPS Take 0.4 mg by mouth 2 (two) times a week.       . valsartan-hydrochlorothiazide (DIOVAN-HCT) 160-25 MG per tablet Take 1 tablet by mouth daily.        No current facility-administered medications on file prior to visit.    Allergies  Allergen Reactions  . Pioglitazone     edema    Family History  Problem Relation Age of Onset  . Arthritis Other     Parents  . Hypertension Other     Parents  . Diabetes Other     Parents    BP 136/80  Pulse 80  Wt 264 lb (119.75 kg)  BMI 36.31 kg/m2  SpO2 98%  Review of Systems denies hypoglycemia    Objective:   Physical Exam VITAL SIGNS:  See vs  page GENERAL: no distress Pulses: dorsalis pedis intact bilat.   Feet: no deformity.  no ulcer on the feet.  feet are of normal color and temp.  no edema Neuro: sensation is intact to touch on the feet   (pt says his a1c was recently 8.2)    Assessment & Plan:  DM: needs increased rx

## 2013-04-01 ENCOUNTER — Telehealth: Payer: Self-pay | Admitting: Cardiology

## 2013-04-01 ENCOUNTER — Encounter (HOSPITAL_COMMUNITY): Payer: Self-pay | Admitting: *Deleted

## 2013-04-01 ENCOUNTER — Emergency Department (HOSPITAL_COMMUNITY): Payer: BC Managed Care – PPO

## 2013-04-01 ENCOUNTER — Inpatient Hospital Stay (HOSPITAL_COMMUNITY)
Admission: EM | Admit: 2013-04-01 | Discharge: 2013-04-03 | DRG: 138 | Disposition: A | Discharge status: Home / Self Care | Payer: BC Managed Care – PPO | Attending: Cardiology | Admitting: Cardiology

## 2013-04-01 DIAGNOSIS — N200 Calculus of kidney: Secondary | ICD-10-CM | POA: Diagnosis present

## 2013-04-01 DIAGNOSIS — R079 Chest pain, unspecified: Secondary | ICD-10-CM

## 2013-04-01 DIAGNOSIS — I4892 Unspecified atrial flutter: Principal | ICD-10-CM | POA: Diagnosis present

## 2013-04-01 DIAGNOSIS — I2510 Atherosclerotic heart disease of native coronary artery without angina pectoris: Secondary | ICD-10-CM | POA: Diagnosis present

## 2013-04-01 DIAGNOSIS — E669 Obesity, unspecified: Secondary | ICD-10-CM | POA: Diagnosis present

## 2013-04-01 DIAGNOSIS — Z6836 Body mass index (BMI) 36.0-36.9, adult: Secondary | ICD-10-CM

## 2013-04-01 DIAGNOSIS — I428 Other cardiomyopathies: Secondary | ICD-10-CM | POA: Diagnosis present

## 2013-04-01 DIAGNOSIS — I119 Hypertensive heart disease without heart failure: Secondary | ICD-10-CM | POA: Diagnosis present

## 2013-04-01 DIAGNOSIS — E785 Hyperlipidemia, unspecified: Secondary | ICD-10-CM | POA: Diagnosis present

## 2013-04-01 DIAGNOSIS — Z7982 Long term (current) use of aspirin: Secondary | ICD-10-CM

## 2013-04-01 DIAGNOSIS — Z87891 Personal history of nicotine dependence: Secondary | ICD-10-CM

## 2013-04-01 DIAGNOSIS — E119 Type 2 diabetes mellitus without complications: Secondary | ICD-10-CM | POA: Diagnosis present

## 2013-04-01 DIAGNOSIS — I452 Bifascicular block: Secondary | ICD-10-CM | POA: Diagnosis present

## 2013-04-01 LAB — CBC
HCT: 46.5 % (ref 39.0–52.0)
Hemoglobin: 16.1 g/dL (ref 13.0–17.0)
MCH: 28 pg (ref 26.0–34.0)
MCHC: 34.6 g/dL (ref 30.0–36.0)
MCV: 80.7 fL (ref 78.0–100.0)
Platelets: 164 10*3/uL (ref 150–400)
RBC: 5.76 MIL/uL (ref 4.22–5.81)
RDW: 13 % (ref 11.5–15.5)
WBC: 6.8 10*3/uL (ref 4.0–10.5)

## 2013-04-01 LAB — BASIC METABOLIC PANEL
BUN: 22 mg/dL (ref 6–23)
CO2: 29 mEq/L (ref 19–32)
Calcium: 10.3 mg/dL (ref 8.4–10.5)
Chloride: 101 mEq/L (ref 96–112)
Creatinine, Ser: 1.04 mg/dL (ref 0.50–1.35)
GFR calc Af Amer: 87 mL/min — ABNORMAL LOW (ref >90)
GFR calc non Af Amer: 75 mL/min — ABNORMAL LOW (ref >90)
Glucose, Bld: 150 mg/dL — ABNORMAL HIGH (ref 70–99)
Potassium: 3.9 mEq/L (ref 3.5–5.1)
Sodium: 140 mEq/L (ref 135–145)

## 2013-04-01 LAB — POCT I-STAT TROPONIN I: Troponin i, poc: 0.02 ng/mL (ref 0.00–0.08)

## 2013-04-01 MED ORDER — ASPIRIN 81 MG PO CHEW
324.0000 mg | CHEWABLE_TABLET | Freq: Once | ORAL | Status: AC
  Administered 2013-04-02: 324 mg via ORAL
  Filled 2013-04-01: qty 4

## 2013-04-01 MED ORDER — NITROGLYCERIN 0.4 MG SL SUBL
0.4000 mg | SUBLINGUAL_TABLET | SUBLINGUAL | Status: DC | PRN
  Administered 2013-04-02: 0.4 mg via SUBLINGUAL
  Filled 2013-04-01: qty 25

## 2013-04-01 NOTE — ED Notes (Signed)
EKG shot at 2310 in triage upon pt arrival.

## 2013-04-01 NOTE — ED Notes (Addendum)
Pt states bilateral non radiating CP, with feeling light headed and sweating, SOB. Pain exacerbated by laying down. Pain decreased by Tramadol. Activity at time of start sitting in a chair.

## 2013-04-01 NOTE — ED Notes (Signed)
Pt denies nausea/vomiting. Pt denies radiating pain. Pt denies SOB.

## 2013-04-01 NOTE — ED Notes (Signed)
EKG placed in pt chart.

## 2013-04-01 NOTE — Telephone Encounter (Signed)
Patient called in reporting chest pain and an elevated HR in the 140s.  No history of arrhythmia that he knows of.  He had a cath in 2012 with 60% LAD stenosis, medical management.  EF was about 45% at the time.  I advised him that he needed to be evaluated in the nearest ER.

## 2013-04-02 ENCOUNTER — Encounter (HOSPITAL_COMMUNITY): Payer: Self-pay | Admitting: Anesthesiology

## 2013-04-02 ENCOUNTER — Inpatient Hospital Stay (HOSPITAL_COMMUNITY): Payer: BC Managed Care – PPO | Admitting: Anesthesiology

## 2013-04-02 ENCOUNTER — Encounter (HOSPITAL_COMMUNITY)
Admission: EM | Disposition: A | Discharge status: Home / Self Care | Payer: Self-pay | Source: Home / Self Care | Attending: Cardiology

## 2013-04-02 ENCOUNTER — Encounter (HOSPITAL_COMMUNITY): Payer: Self-pay | Admitting: *Deleted

## 2013-04-02 DIAGNOSIS — I119 Hypertensive heart disease without heart failure: Secondary | ICD-10-CM

## 2013-04-02 DIAGNOSIS — I452 Bifascicular block: Secondary | ICD-10-CM

## 2013-04-02 DIAGNOSIS — I2510 Atherosclerotic heart disease of native coronary artery without angina pectoris: Secondary | ICD-10-CM

## 2013-04-02 DIAGNOSIS — E669 Obesity, unspecified: Secondary | ICD-10-CM | POA: Diagnosis present

## 2013-04-02 DIAGNOSIS — I428 Other cardiomyopathies: Secondary | ICD-10-CM | POA: Diagnosis present

## 2013-04-02 DIAGNOSIS — I509 Heart failure, unspecified: Secondary | ICD-10-CM

## 2013-04-02 DIAGNOSIS — R079 Chest pain, unspecified: Secondary | ICD-10-CM

## 2013-04-02 DIAGNOSIS — I4892 Unspecified atrial flutter: Principal | ICD-10-CM | POA: Diagnosis present

## 2013-04-02 HISTORY — PX: CARDIOVERSION: SHX1299

## 2013-04-02 HISTORY — DX: Atherosclerotic heart disease of native coronary artery without angina pectoris: I25.10

## 2013-04-02 HISTORY — DX: Hypertensive heart disease without heart failure: I11.9

## 2013-04-02 HISTORY — DX: Bifascicular block: I45.2

## 2013-04-02 LAB — BASIC METABOLIC PANEL
BUN: 20 mg/dL (ref 6–23)
CO2: 20 mEq/L (ref 19–32)
Calcium: 8.9 mg/dL (ref 8.4–10.5)
Chloride: 105 mEq/L (ref 96–112)
Creatinine, Ser: 0.83 mg/dL (ref 0.50–1.35)
GFR calc Af Amer: >90 mL/min (ref >90)
GFR calc non Af Amer: >90 mL/min (ref >90)
Glucose, Bld: 234 mg/dL — ABNORMAL HIGH (ref 70–99)
Potassium: 3.6 mEq/L (ref 3.5–5.1)
Sodium: 138 mEq/L (ref 135–145)

## 2013-04-02 LAB — TSH: TSH: 1.108 u[IU]/mL (ref 0.350–4.500)

## 2013-04-02 LAB — TROPONIN I
Troponin I: <0.3 ng/mL (ref <0.30)
Troponin I: <0.3 ng/mL (ref <0.30)
Troponin I: <0.3 ng/mL (ref <0.30)

## 2013-04-02 LAB — GLUCOSE, CAPILLARY
Glucose-Capillary: 205 mg/dL — ABNORMAL HIGH (ref 70–99)
Glucose-Capillary: 163 mg/dL — ABNORMAL HIGH (ref 70–99)
Glucose-Capillary: 222 mg/dL — ABNORMAL HIGH (ref 70–99)
Glucose-Capillary: 244 mg/dL — ABNORMAL HIGH (ref 70–99)

## 2013-04-02 LAB — MRSA PCR SCREENING: MRSA by PCR: NEGATIVE

## 2013-04-02 LAB — HEMOGLOBIN A1C
Hgb A1c MFr Bld: 7.7 % — ABNORMAL HIGH (ref <5.7)
Mean Plasma Glucose: 174 mg/dL — ABNORMAL HIGH (ref <117)

## 2013-04-02 LAB — D-DIMER, QUANTITATIVE (NOT AT ARMC): D-Dimer, Quant: <0.27 ug/mL-FEU (ref 0.00–0.48)

## 2013-04-02 LAB — HEPARIN LEVEL (UNFRACTIONATED): Heparin Unfractionated: 0.11 IU/mL — ABNORMAL LOW (ref 0.30–0.70)

## 2013-04-02 LAB — PRO B NATRIURETIC PEPTIDE: Pro B Natriuretic peptide (BNP): 1443 pg/mL — ABNORMAL HIGH (ref 0–125)

## 2013-04-02 LAB — PROTIME-INR
INR: 1.08 (ref 0.00–1.49)
Prothrombin Time: 13.9 seconds (ref 11.6–15.2)

## 2013-04-02 SURGERY — CARDIOVERSION
Anesthesia: Monitor Anesthesia Care | Wound class: Clean

## 2013-04-02 MED ORDER — AMIODARONE LOAD VIA INFUSION
150.0000 mg | Freq: Once | INTRAVENOUS | Status: AC
  Administered 2013-04-02: 150 mg via INTRAVENOUS
  Filled 2013-04-02: qty 83.34

## 2013-04-02 MED ORDER — SODIUM CHLORIDE 0.9 % IV SOLN
Freq: Once | INTRAVENOUS | Status: AC
Start: 2013-04-02 — End: 2013-04-02
  Administered 2013-04-02: 03:00:00 via INTRAVENOUS

## 2013-04-02 MED ORDER — ADENOSINE 6 MG/2ML IV SOLN
INTRAVENOUS | Status: AC
  Administered 2013-04-02: 6 mg via INTRAVENOUS
  Filled 2013-04-02: qty 4

## 2013-04-02 MED ORDER — ONDANSETRON HCL 4 MG/2ML IJ SOLN
4.0000 mg | Freq: Once | INTRAMUSCULAR | Status: AC
  Administered 2013-04-02: 4 mg via INTRAVENOUS
  Filled 2013-04-02: qty 2

## 2013-04-02 MED ORDER — ONDANSETRON HCL 4 MG/2ML IJ SOLN: 4.0000 mg | Freq: Four times a day (QID) | INTRAMUSCULAR | Status: DC | PRN

## 2013-04-02 MED ORDER — LIRAGLUTIDE 18 MG/3ML ~~LOC~~ SOLN: 1.8000 mg | Freq: Every evening | SUBCUTANEOUS | Status: DC

## 2013-04-02 MED ORDER — SODIUM CHLORIDE 0.9 % IJ SOLN
3.0000 mL | Freq: Two times a day (BID) | INTRAMUSCULAR | Status: DC
  Administered 2013-04-02: 3 mL via INTRAVENOUS

## 2013-04-02 MED ORDER — SIMVASTATIN 20 MG PO TABS
20.0000 mg | ORAL_TABLET | Freq: Every day | ORAL | Status: DC
  Administered 2013-04-02: 20 mg via ORAL
  Filled 2013-04-02 (×2): qty 1

## 2013-04-02 MED ORDER — INSULIN ASPART 100 UNIT/ML ~~LOC~~ SOLN
0.0000 [IU] | Freq: Three times a day (TID) | SUBCUTANEOUS | Status: DC
  Administered 2013-04-02 (×2): 5 [IU] via SUBCUTANEOUS
  Administered 2013-04-02 – 2013-04-03 (×2): 3 [IU] via SUBCUTANEOUS

## 2013-04-02 MED ORDER — METFORMIN HCL ER 750 MG PO TB24
2000.0000 mg | ORAL_TABLET | Freq: Every day | ORAL | Status: DC
  Administered 2013-04-03: 2000 mg via ORAL
  Filled 2013-04-02 (×2): qty 1

## 2013-04-02 MED ORDER — METOPROLOL TARTRATE 25 MG PO TABS
25.0000 mg | ORAL_TABLET | Freq: Two times a day (BID) | ORAL | Status: DC
  Administered 2013-04-02 – 2013-04-03 (×2): 25 mg via ORAL
  Filled 2013-04-02 (×3): qty 1

## 2013-04-02 MED ORDER — HEPARIN (PORCINE) IN NACL 100-0.45 UNIT/ML-% IJ SOLN
1950.0000 [IU]/h | INTRAMUSCULAR | Status: AC
  Administered 2013-04-02: 1950 [IU]/h via INTRAVENOUS
  Filled 2013-04-02: qty 250

## 2013-04-02 MED ORDER — DILTIAZEM HCL 100 MG IV SOLR: 5.0000 mg/h | INTRAVENOUS | Status: DC

## 2013-04-02 MED ORDER — HYDROCORTISONE 1 % EX CREA
1.0000 "application " | TOPICAL_CREAM | Freq: Three times a day (TID) | CUTANEOUS | Status: DC | PRN
  Filled 2013-04-02: qty 28

## 2013-04-02 MED ORDER — VALSARTAN-HYDROCHLOROTHIAZIDE 160-25 MG PO TABS: 1.0000 | ORAL_TABLET | Freq: Every day | ORAL | Status: DC

## 2013-04-02 MED ORDER — NITROGLYCERIN 0.4 MG SL SUBL: 0.4000 mg | SUBLINGUAL_TABLET | SUBLINGUAL | Status: DC | PRN

## 2013-04-02 MED ORDER — SODIUM CHLORIDE 0.9 % IV SOLN
Freq: Once | INTRAVENOUS | Status: AC
  Administered 2013-04-02: 02:00:00 via INTRAVENOUS

## 2013-04-02 MED ORDER — POTASSIUM CITRATE ER 10 MEQ (1080 MG) PO TBCR
20.0000 meq | EXTENDED_RELEASE_TABLET | Freq: Every day | ORAL | Status: DC
  Filled 2013-04-02: qty 2

## 2013-04-02 MED ORDER — VALSARTAN 160 MG PO TABS
160.0000 mg | ORAL_TABLET | Freq: Every day | ORAL | Status: DC
  Administered 2013-04-02 – 2013-04-03 (×2): 160 mg via ORAL
  Filled 2013-04-02 (×2): qty 1

## 2013-04-02 MED ORDER — PERFLUTREN LIPID MICROSPHERE
INTRAVENOUS | Status: AC
  Filled 2013-04-02: qty 10

## 2013-04-02 MED ORDER — PERFLUTREN LIPID MICROSPHERE
INTRAVENOUS | Status: AC
  Administered 2013-04-02: 2 mL
  Filled 2013-04-02: qty 10

## 2013-04-02 MED ORDER — HEPARIN BOLUS VIA INFUSION
5000.0000 [IU] | Freq: Once | INTRAVENOUS | Status: AC
  Administered 2013-04-02: 5000 [IU] via INTRAVENOUS

## 2013-04-02 MED ORDER — MORPHINE SULFATE 4 MG/ML IJ SOLN
4.0000 mg | Freq: Once | INTRAMUSCULAR | Status: AC
  Administered 2013-04-02: 4 mg via INTRAVENOUS
  Filled 2013-04-02: qty 1

## 2013-04-02 MED ORDER — SODIUM CHLORIDE 0.9 % IJ SOLN: 3.0000 mL | INTRAMUSCULAR | Status: DC | PRN

## 2013-04-02 MED ORDER — SODIUM CHLORIDE 0.9 % IV SOLN
250.0000 mL | INTRAVENOUS | Status: DC | PRN
  Administered 2013-04-02: 14:00:00 via INTRAVENOUS

## 2013-04-02 MED ORDER — SODIUM CHLORIDE 0.9 % IV BOLUS (SEPSIS)
1000.0000 mL | Freq: Once | INTRAVENOUS | Status: AC
  Administered 2013-04-02: 1000 mL via INTRAVENOUS

## 2013-04-02 MED ORDER — PERFLUTREN LIPID MICROSPHERE
1.0000 mL | INTRAVENOUS | Status: DC | PRN
  Filled 2013-04-02: qty 10

## 2013-04-02 MED ORDER — PERFLUTREN LIPID MICROSPHERE
1.0000 mL | INTRAVENOUS | Status: AC | PRN
  Administered 2013-04-02: 2 mL via INTRAVENOUS
  Filled 2013-04-02: qty 10

## 2013-04-02 MED ORDER — HEPARIN BOLUS VIA INFUSION
3000.0000 [IU] | Freq: Once | INTRAVENOUS | Status: AC
  Administered 2013-04-02: 3000 [IU] via INTRAVENOUS
  Filled 2013-04-02: qty 3000

## 2013-04-02 MED ORDER — SPIRONOLACTONE 12.5 MG HALF TABLET
12.5000 mg | ORAL_TABLET | Freq: Every day | ORAL | Status: DC
  Administered 2013-04-02: 12.5 mg via ORAL
  Filled 2013-04-02: qty 1

## 2013-04-02 MED ORDER — RIVAROXABAN 20 MG PO TABS
20.0000 mg | ORAL_TABLET | Freq: Every day | ORAL | Status: DC
  Administered 2013-04-02: 20 mg via ORAL
  Filled 2013-04-02 (×2): qty 1

## 2013-04-02 MED ORDER — INSULIN ASPART 100 UNIT/ML ~~LOC~~ SOLN
0.0000 [IU] | Freq: Every day | SUBCUTANEOUS | Status: DC
  Administered 2013-04-02: 2 [IU] via SUBCUTANEOUS

## 2013-04-02 MED ORDER — ASPIRIN 81 MG PO TBEC: 81.0000 mg | DELAYED_RELEASE_TABLET | Freq: Every day | ORAL | Status: DC

## 2013-04-02 MED ORDER — ASPIRIN EC 81 MG PO TBEC
81.0000 mg | DELAYED_RELEASE_TABLET | Freq: Every day | ORAL | Status: DC
  Administered 2013-04-02 – 2013-04-03 (×2): 81 mg via ORAL
  Filled 2013-04-02 (×2): qty 1

## 2013-04-02 MED ORDER — HYDROCHLOROTHIAZIDE 25 MG PO TABS
25.0000 mg | ORAL_TABLET | Freq: Every day | ORAL | Status: DC
  Administered 2013-04-02 – 2013-04-03 (×2): 25 mg via ORAL
  Filled 2013-04-02 (×3): qty 1

## 2013-04-02 MED ORDER — SPIRONOLACTONE 25 MG PO TABS
25.0000 mg | ORAL_TABLET | Freq: Every day | ORAL | Status: DC
  Administered 2013-04-03: 25 mg via ORAL
  Filled 2013-04-02: qty 1

## 2013-04-02 MED ORDER — AMIODARONE HCL IN DEXTROSE 360-4.14 MG/200ML-% IV SOLN
30.0000 mg/h | INTRAVENOUS | Status: DC
  Administered 2013-04-02 (×2): 30 mg/h via INTRAVENOUS
  Filled 2013-04-02 (×3): qty 200

## 2013-04-02 MED ORDER — DILTIAZEM HCL 25 MG/5ML IV SOLN
10.0000 mg | Freq: Once | INTRAVENOUS | Status: AC
  Administered 2013-04-02: 10 mg via INTRAVENOUS
  Filled 2013-04-02: qty 5

## 2013-04-02 MED ORDER — ACETAMINOPHEN 325 MG PO TABS: 650.0000 mg | ORAL_TABLET | ORAL | Status: DC | PRN

## 2013-04-02 MED ORDER — POTASSIUM CHLORIDE CRYS ER 20 MEQ PO TBCR
20.0000 meq | EXTENDED_RELEASE_TABLET | Freq: Every day | ORAL | Status: DC
  Administered 2013-04-02 – 2013-04-03 (×2): 20 meq via ORAL
  Filled 2013-04-02 (×2): qty 1

## 2013-04-02 MED ORDER — SODIUM CHLORIDE 0.45 % IV SOLN
INTRAVENOUS | Status: DC
  Administered 2013-04-02: 11:00:00 via INTRAVENOUS

## 2013-04-02 MED ORDER — AMIODARONE HCL IN DEXTROSE 360-4.14 MG/200ML-% IV SOLN
60.0000 mg/h | INTRAVENOUS | Status: AC
  Administered 2013-04-02 (×2): 60 mg/h via INTRAVENOUS
  Filled 2013-04-02 (×2): qty 200

## 2013-04-02 MED ORDER — ADENOSINE 6 MG/2ML IV SOLN
6.0000 mg | Freq: Once | INTRAVENOUS | Status: AC
  Administered 2013-04-02: 6 mg via INTRAVENOUS

## 2013-04-02 MED ORDER — HEPARIN (PORCINE) IN NACL 100-0.45 UNIT/ML-% IJ SOLN
1650.0000 [IU]/h | INTRAMUSCULAR | Status: DC
  Administered 2013-04-02: 1650 [IU]/h via INTRAVENOUS
  Filled 2013-04-02 (×2): qty 250

## 2013-04-02 MED ORDER — PROPOFOL 10 MG/ML IV BOLUS
INTRAVENOUS | Status: DC | PRN
  Administered 2013-04-02: 70 mg via INTRAVENOUS

## 2013-04-02 NOTE — Anesthesia Postprocedure Evaluation (Signed)
  Anesthesia Post-op Note  Patient: Matthew Schmitt  Procedure(s) Performed: Procedure(s): CARDIOVERSION (N/A)  Patient Location: PACU  Anesthesia Type:General  Level of Consciousness: awake  Airway and Oxygen Therapy: Patient Spontanous Breathing  Post-op Pain: mild  Post-op Assessment: Post-op Vital signs reviewed  Post-op Vital Signs: Reviewed  Complications: No apparent anesthesia complications

## 2013-04-02 NOTE — Progress Notes (Signed)
Subjective:  Vague tightness, enzymes negative.  Still in atrial flutter  Objective:  Vital Signs in the last 24 hours: BP 109/69  Pulse 139  Temp(Src) 98.3 F (36.8 C) (Oral)  Resp 16  Ht 5' 11.5" (1.816 m)  Wt 120.3 kg (265 lb 3.4 oz)  BMI 36.48 kg/m2  SpO2 95%  Physical Exam: Pleasant  Obese WM in NAD Lungs:  Clear to A&P Cardiac:  Rapid regular rhythm, normal S1 and S2, no S3 Abdomen:  Soft, nontender, no masses Extremities:  No edema present  Intake/Output from previous day: 05/14 0701 - 05/15 0700 In: 1234.9 [I.V.:1234.9] Out: -   Weight Filed Weights   04/01/13 2308 04/02/13 0400  Weight: 120.112 kg (264 lb 12.8 oz) 120.3 kg (265 lb 3.4 oz)    Lab Results: Basic Metabolic Panel:  Recent Labs  04/01/13 2312 04/02/13 0405  NA 140 138  K 3.9 3.6  CL 101 105  CO2 29 20  GLUCOSE 150* 234*  BUN 22 20  CREATININE 1.04 0.83   CBC:  Recent Labs  04/01/13 2312  WBC 6.8  HGB 16.1  HCT 46.5  MCV 80.7  PLT 164   Cardiac Enzymes:  Recent Labs  04/02/13 0405  TROPONINI <0.30    Telemetry: Atrial flutter with 2:1 block  Assessment/Plan:  1. New onset atrial flutter 2. Cardiomyopathy 3. Hypertensive heart disease 4. Diabetes 5. Obesity 6. CAD  Rec:  Cardioversion today.  Risks discussed with patient.  He has been in flutter  for less than 24 hours.  Check ECHO.     W. Spencer Tilley, Jr.  MD FACC Cardiology  04/02/2013, 8:51 AM    

## 2013-04-02 NOTE — Progress Notes (Addendum)
Cardioversion done with 100 jouls given by dr. Donnie Aho tolerated well. Awakened after few min. Converted to NSR verified with EKG. Continue to monitor. Left chest and upper back with slight redness from the pads. Denied any discomfort.

## 2013-04-02 NOTE — Progress Notes (Signed)
Patient feels much better.  No pain or SOB.  Remains in NSR.  ECHO shows EF around 25-30% but may be due to rate.   Home meds restarted and begun on Xarelto.  If stable overnight, plan d/c in am.  W. Ashley Royalty MD [facility]

## 2013-04-02 NOTE — Progress Notes (Signed)
ANTICOAGULATION CONSULT NOTE - Initial Consult  Pharmacy Consult for heparin Indication: Atrial flutter   Allergies  Allergen Reactions  . Other Cough    Blood pressure medication-reaction was about 18 years ago  . Pioglitazone     edema    Patient Measurements: Height: 5' 11.65" (182 cm) Weight: 264 lb 12.8 oz (120.112 kg) IBW/kg (Calculated) : 76.8 Heparin Dosing Weight: 103 kg  Vital Signs: Temp: 98.7 F (37.1 C) (05/14 2308) Temp src: Oral (05/14 2308) BP: 108/69 mmHg (05/15 0048) Pulse Rate: 143 (05/15 0048)  Labs:  Recent Labs  04/01/13 2312  HGB 16.1  HCT 46.5  PLT 164  CREATININE 1.04    Estimated Creatinine Clearance: 98 ml/min (by C-G formula based on Cr of 1.04).   Medical History: Past Medical History  Diagnosis Date  . Impotence of organic origin   . Mixed hyperlipidemia   . Essential hypertension, benign   . Type II or unspecified type diabetes mellitus without mention of complication, uncontrolled   . Left ureteral calculus   . Arthritis of both knees   . History of kidney stones   . Renal calculi bilateral non-obstructive  . RBBB   . Coronary artery disease     Medications:  Scheduled:    Assessment: 62 yo male presented with chest pain and tachycardia. Pharmacy to manage IV heparin for atrial flutter.   Goal of Therapy:  Heparin level 0.3-0.7 units/ml Monitor platelets by anticoagulation protocol: Yes   Plan:  1. Heparin 5000 unit IV bolus, then heparin IV infusion at 1650 units/hr.  2. Heparin level in 6 hours 3. Daily CBC, heparin level.   Emeline Gins 04/02/2013,1:52 AM

## 2013-04-02 NOTE — Interval H&P Note (Signed)
History and Physical Interval Note:  04/02/2013 1:46 PM  Matthew Schmitt  has presented today for surgery, with the diagnosis of a fib  The various methods of treatment have been discussed with the patient and family. After consideration of risks, benefits and other options for treatment, the patient has consented to  Procedure(s): CARDIOVERSION (N/A) as a surgical intervention .  The patient's history has been reviewed, patient examined, no change in status, stable for surgery.  I have reviewed the patient's chart and labs.  Questions were answered to the patient's satisfaction.     Clent Damore JR,W SPENCER

## 2013-04-02 NOTE — ED Provider Notes (Signed)
History     CSN: [id number]  Arrival date & time 04/01/13  2252   First MD Initiated Contact with Patient 04/01/13 2344      Chief Complaint  Patient presents with  . Chest Pain    (Consider location/radiation/quality/duration/timing/severity/associated sxs/prior treatment) Patient is a 62 y.o. male presenting with chest pain.  Chest Pain Associated symptoms: no abdominal pain, no back pain, no fever, no headache and no shortness of breath    HX per patient - CP onset this am while at work sits at a desk, upper R/L sided pain not radiating feesl like a pulled muscle, sl SOB, no F/C, no cough, no palpitations, pain persistent tonight and checked his BP and noted his HR 140s, called his cardiologist and was referred here. No leg pain r swelling, no h/o DVT or PE.pain MOD in severity.h/o stent CAD. Past Medical History  Diagnosis Date  . Impotence of organic origin   . Mixed hyperlipidemia   . Essential hypertension, benign   . Type II or unspecified type diabetes mellitus without mention of complication, uncontrolled   . Left ureteral calculus   . Arthritis of both knees   . History of kidney stones   . Renal calculi bilateral non-obstructive  . RBBB   . Coronary artery disease     Past Surgical History  Procedure Laterality Date  . Knee surgery  1978  &  1974    BILATERAL  . Cardiac catheterization  08-17-2011  DR SPENCER TILLEY    POSSIBLE MODERATE LAD STENOSIS JUST AFTER THE BIFURCATION OF LARGE DIAGONAL BRANCH/ LVEF  40-45%/ MILD GLOBAL HYPODINESIS (CATH DONE FOR ABNORMAL STRESS TEST OF FIXED LATERAL WALL DEFECT & BIFASCICULAR BLAOCK)  . Tonsillectomy  33  (age 23)  . Cystoscopy with retrograde pyelogram, ureteroscopy and stent placement  11/03/2012    Procedure: CYSTOSCOPY WITH RETROGRADE PYELOGRAM, URETEROSCOPY AND STENT PLACEMENT;  Surgeon: Valetta Fuller, MD;  Location: [facility];  Service: Urology;  Laterality: Left;  Cystoscopy/Left  Retrograde/Ureteroscopy/Holmium Laser Litho/Double J Stent  . Holmium laser application  11/03/2012    Procedure: HOLMIUM LASER APPLICATION;  Surgeon: Valetta Fuller, MD;  Location: [facility];  Service: Urology;  Laterality: Left;    Family History  Problem Relation Age of Onset  . Arthritis Other     Parents  . Hypertension Other     Parents  . Diabetes Other     Parents    History  Substance Use Topics  . Smoking status: Former Smoker -- 1.00 packs/day for 6 years    Types: Cigarettes    Quit date: 10/31/1969  . Smokeless tobacco: Not on file     Comment: QUIT SMOKING IN 1970'S  . Alcohol Use: Yes     Comment: OCCASIONAL      Review of Systems  Constitutional: Negative for fever and chills.  HENT: Negative for neck pain and neck stiffness.   Eyes: Negative for pain.  Respiratory: Negative for shortness of breath.   Cardiovascular: Positive for chest pain.  Gastrointestinal: Negative for abdominal pain.  Genitourinary: Negative for dysuria.  Musculoskeletal: Negative for back pain.  Skin: Negative for rash.  Neurological: Negative for headaches.  All other systems reviewed and are negative.    Allergies  Other and Pioglitazone  Home Medications   Current Outpatient Rx  Name  Route  Sig  Dispense  Refill  . aspirin (BAYER ASPIRIN EC LOW DOSE) 81 MG EC tablet   Oral  Take 81 mg by mouth daily.          Marland Kitchen doxazosin (CARDURA) 8 MG tablet   Oral   Take 8 mg by mouth daily.          Marland Kitchen glucose blood (ONE TOUCH ULTRA TEST) test strip   Other   1 each by Other route daily. And lancets 1/day 250.00   100 each   12   . Liraglutide (VICTOZA) 18 MG/3ML SOLN injection   Subcutaneous   Inject 0.3 mLs (1.8 mg total) into the skin every evening.   9 mL   3   . metFORMIN (GLUCOPHAGE-XR) 500 MG 24 hr tablet   Oral   Take 2,000 mg by mouth daily with breakfast. 4 tabs each morning         . metoprolol tartrate (LOPRESSOR) 25 MG tablet    Oral   Take 25 mg by mouth 2 (two) times daily.          . naproxen sodium (ANAPROX) 220 MG tablet   Oral   Take 220 mg by mouth 2 (two) times daily as needed (pain).         . potassium citrate (UROCIT-K) 10 MEQ (1080 MG) SR tablet   Oral   Take 20 mEq by mouth daily.          . pravastatin (PRAVACHOL) 40 MG tablet   Oral   Take 40 mg by mouth every evening.          . Tamsulosin HCl (FLOMAX) 0.4 MG CAPS   Oral   Take 0.4 mg by mouth 2 (two) times a week. Sundays and Thursday mornings         . valsartan-hydrochlorothiazide (DIOVAN-HCT) 160-25 MG per tablet   Oral   Take 1 tablet by mouth daily.          Marland Kitchen spironolactone (ALDACTONE) 25 MG tablet   Oral   Take 25 mg by mouth daily.           BP 108/69  Pulse 143  Temp(Src) 98.7 F (37.1 C) (Oral)  Resp 14  Wt 264 lb 12.8 oz (120.112 kg)  BMI 36.42 kg/m2  SpO2 94%  Physical Exam  Constitutional: He is oriented to person, place, and time. He appears well-developed and well-nourished.  HENT:  Head: Normocephalic and atraumatic.  Eyes: EOM are normal. Pupils are equal, round, and reactive to light. No scleral icterus.  Neck: Neck supple.  Cardiovascular: Regular rhythm and intact distal pulses.   Rapid HR  Pulmonary/Chest: Effort normal and breath sounds normal. No stridor. No respiratory distress. He has no rales. He exhibits no tenderness.  Abdominal: Soft. Bowel sounds are normal. He exhibits no distension. There is no tenderness.  Musculoskeletal: Normal range of motion. He exhibits no edema and no tenderness.  Calves NT   Neurological: He is alert and oriented to person, place, and time.  Skin: Skin is warm and dry.    ED Course  Procedures (including critical care time)  Labs Reviewed  BASIC METABOLIC PANEL - Abnormal; Notable for the following:    Glucose, Bld 150 (*)    GFR calc non Af Amer 75 (*)    GFR calc Af Amer 87 (*)    All other components within normal limits  PRO B NATRIURETIC  PEPTIDE - Abnormal; Notable for the following:    Pro B Natriuretic peptide (BNP) 1443.0 (*)    All other components within normal limits  CBC  D-DIMER,  QUANTITATIVE  POCT I-STAT TROPONIN I   Dg Chest Portable 1 View  04/02/2013   *RADIOLOGY REPORT*  Clinical Data: Chest pain, shortness of breath.  PORTABLE CHEST - 1 VIEW  Comparison: 08/15/2011  Findings: Mild cardiomegaly.  Low lung volumes.  No effusions or focal airspace opacities.  No edema.  No acute bony abnormality.  IMPRESSION: Cardiomegaly.  Low lung volumes.   Original Report Authenticated By: Charlett Nose, M.D.    Date: 04/02/2013  Rate: 147  Rhythm: ST versus A flutter  QRS Axis: left  Intervals: normal  ST/T Wave abnormalities: nonspecific ST/T changes  Conduction Disutrbances:right bundle branch block  Narrative Interpretation:   Old EKG Reviewed: changes noted old RBBB normal rate on prior ECG  CRITICAL CARE Performed by: Cortlyn Cannell Total critical care time: 40 Critical care time was exclusive of separately billable procedures and treating other patients. Critical care was necessary to treat or prevent imminent or life-threatening deterioration. Critical care was time spent personally by me on the following activities: development of treatment plan with patient and/or surrogate as well as nursing, discussions with consultants, evaluation of patient's response to treatment, examination of patient, obtaining history from patient or surrogate, ordering and performing treatments and interventions, ordering and review of laboratory studies, ordering and review of radiographic studies, pulse oximetry and re-evaluation of patient's condition. Active CP h/o ACS with HR 140s requiring cardizem drip and CAR admit  1. Chest pain    After PT evaluated, d/w Dr Jearld Pies on call for CAR. cardizem provided 10mg  IVP no change in rate  1:12 AM d/w CAR again, plan continue cardizem and admit. D-dimer pending. Pain resolved with IV  morphine. IVFs provided with cardizem  MDM  CP rapid HR ST versus A flutter, no fever or DVT symptoms.   ECG, labs, CXR  cardizem bolus/ drip  CAR admit        Sunnie Nielsen, MD 04/02/13 [phone]

## 2013-04-02 NOTE — ED Notes (Signed)
EKG given to Dr. Dierdre Highman. EDP made aware of rapid HR 146bpm. Xray at bedside, Pt alert and interactive in NAD at this time.

## 2013-04-02 NOTE — ED Notes (Addendum)
Per Md [place], give adenosine to see underlying rhythm. RN placed pt on Zoll for precaution. Crash Cart at the bedside. 2nd RN in room pushing medications with primary RN.

## 2013-04-02 NOTE — H&P (Addendum)
Physician History and Physical    Rubin Dais MRN: [id number] DOB/AGE: [birth year] 62 y.o. Admit date: 04/01/2013  Primary Care Physician: Dr. Nathanial Rancher Primary Cardiologist: Dr. Donnie Aho  HPI: 62 yo with history of nonobstructive CAD, mild probably nonischemic cardiomyopathy, DM, and HTN presented with chest pain and tachycardia, with ECG showing atrial flutter with RVR.  Patient was in his usual state of health (no chest pain, no significant exertional dyspnea) until around mid-day today.  At that time, he was sitting at his desk at work and felt upper chest pain.  The chest pain was not pleuritic. He also noted his heart racing.  The pain persisted so he went home.  His HR was 148 on his home BP cuff.  The tachycardia and chest pain persisted until around 8 pm when he called on-call cardiology and was directed to the ER.  In the ER, chest pain resolved with morphine.  He was given diltiazem 10 mg IV with no significant response.  HR was in the 140s, atrial flutter (atrial flutter confirmed with adenosine 6 mg IV). He is currently chest pain free and comfortable though HR is still in the 140s. He has no history of atrial arrhythmias or tachypalpitations.  He has a known nonischemic cardiomyopathy, EF 40-45% by LV-gram and cardiolite in 10/12.  He had a 60% proximal LAD stenosis on cath in 10/12.  He has no history of venous thromboembolism.  [place]  No recent surgeries.    PMH: 1. CAD: Cardiolite in 2012 with fixed lateral defect, EF 42%.  LCH (10/12) with EF 40-45% global hypokinesis, 60% proximal LAD stenosis.  2. Nonischemic cardiomyopathy: EF 40-45% on 10/12 LV gram and 42% on cardiolite, not explained by degree of coronary disease.  3. Type II diabetes  4. HTN 5. Nephrolithiasis 6. Bifascicular block  Review of systems complete and found to be negative unless listed above   Family History  Problem Relation Age of Onset  . Arthritis Other    Parents  . Hypertension Other     Parents  . Diabetes Other     Parents    History   Social History  . Marital Status: Married    Spouse Name: N/A    Number of Children: N/A  . Years of Education: 16   Occupational History  . Emergency planning/management officer, Mining engineer    Social History Main Topics  . Smoking status: Former Smoker -- 1.00 packs/day for 6 years    Types: Cigarettes    Quit date: 10/31/1969  . Smokeless tobacco: Not on file     Comment: QUIT SMOKING IN 1970'S  . Alcohol Use: Yes     Comment: OCCASIONAL  . Drug Use: No  . Sexually Active: Not on file   Other Topics Concern  . Not on file   Social History Narrative   Regular exercise-no      (Not in a hospital admission)  Physical Exam: Blood pressure 108/69, pulse 143, temperature 98.7 F (37.1 C), temperature source Oral, resp. rate 14, weight 264 lb 12.8 oz (120.112 kg), SpO2 94.00%.  General: NAD Neck: JVP 9-10 cm, no thyromegaly or thyroid nodule.  Lungs: Clear to auscultation bilaterally with normal respiratory effort. CV: Nondisplaced PMI.  Heart tachy, regular S1/S2, +S3, no murmur.  1+ ankle edema.  No carotid bruit.  Normal pedal pulses.  Abdomen: Soft, nontender, no hepatosplenomegaly, no distention.  Skin: Intact without lesions or rashes.  Neurologic: Alert  and oriented x 3.  Psych: Normal affect. Extremities: No clubbing or cyanosis.  HEENT: Normal.   Labs:   Lab Results  Component Value Date   WBC 6.8 04/01/2013   HGB 16.1 04/01/2013   HCT 46.5 04/01/2013   MCV 80.7 04/01/2013   PLT 164 04/01/2013    Recent Labs Lab 04/01/13 2312  NA 140  K 3.9  CL 101  CO2 29  BUN 22  CREATININE 1.04  CALCIUM 10.3  GLUCOSE 150*  BNP 1443 TnI 0.02    Radiology:  - CXR: cardiomegaly  EKG: Suspect atrial flutter, rate 144, LAFB, RBBB   ASSESSMENT AND PLAN:  62 yo with history of nonobstructive CAD, mild probably nonischemic cardiomyopathy, DM, and HTN presented with chest pain and  tachycardia, with ECG showing atrial flutter with RVR.  1. Atrial flutter: Onset was likely around noon today.  Uncertain of trigger.  Would rule out PE with D dimer (if positive, he should have CTA chest).  I will cycle cardiac enzymes.  I will attempt to rate control him overnight, and if he does not ome out of flutter, he can be cardioverted tomorrow morning.  I think he can be cardioverted without TEE as onset of flutter was Wednesday at around noon.  I will start a heparin gtt.  I am also going to start him on an amiodarone gtt.  I will use amiodarone prefentially given soft BP and history of LV systolic dysfunction (EF 40-45% when assessed in the past).   2. Chest pain: Now resolved.  Initial cardiac enzymes negative. ECG shows bifascicular block as in past.  As above, plan to cycle cardiac enzymes and to check a D dimer.  Continue ASA, statin.  Patient had a 60% proximal LAD stenosis on prior cath.   3. CHF: Suspect a degree of acute on chronic systolic CHF in the setting of atrial flutter with RVR.  JVP elevated, S3 gallop on exam.   - Get echocardiogram - Will use amiodarone for rate control rather than diltiazem.   4. Diabetes: Sliding scale insulin.   Signed: Marca Ancona 04/02/2013, 1:09 AM

## 2013-04-02 NOTE — Transfer of Care (Signed)
Immediate Anesthesia Transfer of Care Note  Patient: Matthew Schmitt  Procedure(s) Performed: Procedure(s): CARDIOVERSION (N/A)  Patient Location: ICU  Anesthesia Type:General  Level of Consciousness: awake, alert , oriented and patient cooperative  Airway & Oxygen Therapy: Patient Spontanous Breathing and Patient connected to nasal cannula oxygen  Post-op Assessment: Report given to PACU RN and Post -op Vital signs reviewed and stable  Post vital signs: Reviewed and stable  Complications: No apparent anesthesia complications

## 2013-04-02 NOTE — ED Notes (Signed)
IV lines labeled

## 2013-04-02 NOTE — Progress Notes (Signed)
ANTICOAGULATION CONSULT NOTE - Follow Up Consult  Pharmacy Consult for Heparin Indication: new aflutter  Allergies  Allergen Reactions  . Other Cough    Blood pressure medication-reaction was about 18 years ago  . Pioglitazone     edema    Patient Measurements: Height: 5' 11.5" (181.6 cm) Weight: 265 lb 3.4 oz (120.3 kg) IBW/kg (Calculated) : 76.45 Heparin Dosing Weight: 103kg  Vital Signs: Temp: 98.3 F (36.8 C) (05/15 0800) Temp src: Oral (05/15 0800) BP: 103/74 mmHg (05/15 1000) Pulse Rate: 139 (05/15 1000)  Labs:  Recent Labs  04/01/13 2312 04/02/13 0132 04/02/13 0405 04/02/13 0853  HGB 16.1  --   --   --   HCT 46.5  --   --   --   PLT 164  --   --   --   LABPROT  --  13.9  --   --   INR  --  1.08  --   --   HEPARINUNFRC  --   --   --  0.11*  CREATININE 1.04  --  0.83  --   TROPONINI  --   --  <0.30 <0.30    Estimated Creatinine Clearance: 122.7 ml/min (by C-G formula based on Cr of 0.83).   Medications:  Heparin @ 1650 units/hr  Assessment: 61yom started on heparin for new aflutter with RVR. Initial heparin level is subtherapeutic. No issues noted with infusion. No bleeding reported. No CBC today but baseline CBC wnl. For DCCV today.  Goal of Therapy:  Heparin level 0.3-0.7 units/ml Monitor platelets by anticoagulation protocol: Yes   Plan:  1) Heparin bolus 3000 units x 1 2) Increase heparin to 1950 units/hr 3) 6 hour heparin level  Fredrik Rigger 04/02/2013,10:58 AM

## 2013-04-02 NOTE — Anesthesia Preprocedure Evaluation (Addendum)
Anesthesia Evaluation  Patient identified by MRN, date of birth, ID band Patient awake    Reviewed: Allergy & Precautions, H&P , NPO status , Patient's Chart, lab work & pertinent test results, reviewed documented beta blocker date and time   History of Anesthesia Complications Negative for: history of anesthetic complications  Airway       Dental  (+) Teeth Intact and Dental Advisory Given   Pulmonary neg pulmonary ROS,  breath sounds clear to auscultation        Cardiovascular hypertension, Pt. on medications + CAD + dysrhythmias Atrial Fibrillation Rhythm:Irregular Rate:Tachycardia     Neuro/Psych    GI/Hepatic negative GI ROS, Neg liver ROS,   Endo/Other  diabetes, Type 2, Oral Hypoglycemic AgentsMorbid obesity  Renal/GU negative Renal ROS     Musculoskeletal   Abdominal   Peds  Hematology   Anesthesia Other Findings   Reproductive/Obstetrics negative OB ROS                          Anesthesia Physical Anesthesia Plan  ASA: III  Anesthesia Plan: General   Post-op Pain Management:    Induction: Intravenous  Airway Management Planned: Mask  Additional Equipment:   Intra-op Plan:   Post-operative Plan:   Informed Consent: I have reviewed the patients History and Physical, chart, labs and discussed the procedure including the risks, benefits and alternatives for the proposed anesthesia with the patient or authorized representative who has indicated his/her understanding and acceptance.   Dental advisory given  Plan Discussed with: CRNA, Anesthesiologist and Surgeon  Anesthesia Plan Comments:         Anesthesia Quick Evaluation

## 2013-04-02 NOTE — CV Procedure (Addendum)
Electrical Cardioversion Procedure Note  Matthew Schmitt   62 y.o. male MRN: [id number] DOB: 11/01/1951  Today's date: 04/02/2013  Procedure: Electrical Cardioversion  Indications:  Atrial Flutter  Time Out: Verified patient identification, verified procedure,medications/allergies/relevent history reviewed, required imaging and test results available.  Performed  Procedure Details  The patient was NPO after midnight. Anesthesia was administered at the beside  by Dr. Judie Petit with 70mg  of propofol.  Cardioversion was done with synchronized biphasic defibrillation with AP pads with 100 watts.  The patient converted to normal sinus rhythm. The patient tolerated the procedure well   IMPRESSION:  Successful cardioversion of atrial flutter   W. Viann Fish, Montez Hageman. MD [facility]   04/02/2013, 1:47 PM

## 2013-04-02 NOTE — H&P (View-Only) (Signed)
Subjective:  Vague tightness, enzymes negative.  Still in atrial flutter  Objective:  Vital Signs in the last 24 hours: BP 109/69  Pulse 139  Temp(Src) 98.3 F (36.8 C) (Oral)  Resp 16  Ht 5' 11.5" (1.816 m)  Wt 120.3 kg (265 lb 3.4 oz)  BMI 36.48 kg/m2  SpO2 95%  Physical Exam: Pleasant  Obese WM in NAD Lungs:  Clear to A&P Cardiac:  Rapid regular rhythm, normal S1 and S2, no S3 Abdomen:  Soft, nontender, no masses Extremities:  No edema present  Intake/Output from previous day: 05/14 0701 - 05/15 0700 In: 1234.9 [I.V.:1234.9] Out: -   Weight Filed Weights   04/01/13 2308 04/02/13 0400  Weight: 120.112 kg (264 lb 12.8 oz) 120.3 kg (265 lb 3.4 oz)    Lab Results: Basic Metabolic Panel:  Recent Labs  16/10/96 2312 04/02/13 0405  NA 140 138  K 3.9 3.6  CL 101 105  CO2 29 20  GLUCOSE 150* 234*  BUN 22 20  CREATININE 1.04 0.83   CBC:  Recent Labs  04/01/13 2312  WBC 6.8  HGB 16.1  HCT 46.5  MCV 80.7  PLT 164   Cardiac Enzymes:  Recent Labs  04/02/13 0405  TROPONINI <0.30    Telemetry: Atrial flutter with 2:1 block  Assessment/Plan:  1. New onset atrial flutter 2. Cardiomyopathy 3. Hypertensive heart disease 4. Diabetes 5. Obesity 6. CAD  Rec:  Cardioversion today.  Risks discussed with patient.  He has been in flutter  for less than 24 hours.  Check ECHO.     Darden Palmer  MD [facility]  04/02/2013, 8:51 AM

## 2013-04-02 NOTE — Progress Notes (Signed)
UR complete.  Artha Stavros RN, MSN 

## 2013-04-02 NOTE — Progress Notes (Signed)
Echocardiogram 2D Echocardiogram with Definity has been performed.  Matthew Schmitt 04/02/2013, 10:31 AM

## 2013-04-02 NOTE — Preoperative (Signed)
Beta Blockers   Reason not to administer Beta Blockers:Not Applicable 

## 2013-04-03 ENCOUNTER — Encounter (HOSPITAL_COMMUNITY): Payer: Self-pay | Admitting: Cardiology

## 2013-04-03 LAB — GLUCOSE, CAPILLARY: Glucose-Capillary: 175 mg/dL — ABNORMAL HIGH (ref 70–99)

## 2013-04-03 LAB — CBC
HCT: 40.6 % (ref 39.0–52.0)
Hemoglobin: 14 g/dL (ref 13.0–17.0)
MCH: 28.4 pg (ref 26.0–34.0)
MCHC: 34.5 g/dL (ref 30.0–36.0)
MCV: 82.4 fL (ref 78.0–100.0)
Platelets: 129 10*3/uL — ABNORMAL LOW (ref 150–400)
RBC: 4.93 MIL/uL (ref 4.22–5.81)
RDW: 13.3 % (ref 11.5–15.5)
WBC: 4.6 10*3/uL (ref 4.0–10.5)

## 2013-04-03 MED ORDER — RIVAROXABAN 20 MG PO TABS: 20.0000 mg | ORAL_TABLET | Freq: Every day | ORAL | Status: DC

## 2013-04-03 MED ORDER — METOPROLOL TARTRATE 25 MG PO TABS: 50.0000 mg | ORAL_TABLET | Freq: Two times a day (BID) | ORAL | Status: DC

## 2013-04-03 NOTE — Discharge Summary (Signed)
Physician Discharge Summary  Patient ID: Matthew Schmitt MRN: [id number] DOB/AGE: [birth year] 62 y.o.  Admit date: 04/01/2013 Discharge date: 04/03/2013  Primary Physician:  Dr. Burnell Blanks, Dr. Romero Belling  Primary Discharge Diagnosis: 1. Atrial flutter with rapid ventricular response-resolved with cardioversion  Secondary Discharge Diagnosis: 2. Nonischemic cardiomyopathy with worsening of ejection fraction likely due to rapid atrial flutter 3. History of moderate coronary artery disease 4. Type 2 diabetes mellitus non-insulin-dependent 5. Hypertensive heart disease 6. Hyperlipidemia 7. Obesity 8. Bifascicular block 9. History of kidney stones  Procedures:  Cardioversion, 2-D echocardiogram  Hospital Course: This 62 year old male has a history of a cardiomyopathy thought due to uncontrolled hypertension over the years. He has known bifascicular block and had moderate disease of the LAD had a catheterization about 2 years ago. He was in his usual state of health until he developed shortness of breath and some vague chest pain and felt his heart pounding. He came to the emergency room and was found to be in atrial flutter with 2 to one block and was brought in.  He was placed on intravenous heparin and intravenous amiodarone but did not have significant slowing of his atrial flutter rate. Because the duration of atrial flutter was less than 24 hours, decision was made to proceed with early cardioversion. This was performed on 5/15 with conversion to sinus rhythm. He felt much better following this. His cardiac enzymes were normal. He was initially started on heparin and then was transitioned to [facility] He was ambulatory in the hall without further arrhythmias and was discharged home the next day in improved condition. All of his cardiac enzymes were negative.  Echocardiogram showed significant LVH with severe global hypokinesis with an estimated ejection fraction of 25-30%. At the time  of the echocardiogram he was in rapid atrial flutter and he will have a repeat echocardiogram done to ensure that his ventricular function improves. He was also found to have mild aortic valve thickening as well as a mildly dilated aortic root.  He had been taking nonsteroidal anti-inflammatory agents and was advised to discontinue these in the future. He is also advised to check his blood pressure on a daily basis and because of his severe LVH as well as depressed ejection fraction he will need to have very excellent blood pressure control.  Discharge Exam: Blood pressure 174/91, pulse 66, temperature 97.9 F (36.6 C), temperature source Oral, resp. rate 16, height 5' 11.5" (1.816 m), weight 120.3 kg (265 lb 3.4 oz), SpO2 96.00%.   Lungs clear, no S3. He was in sinus rhythm with a rate of 73 on discharge.  Labs: CBC:   Lab Results  Component Value Date   WBC 4.6 04/03/2013   HGB 14.0 04/03/2013   HCT 40.6 04/03/2013   MCV 82.4 04/03/2013   PLT 129* 04/03/2013   CMP:  Recent Labs Lab 04/02/13 0405  NA 138  K 3.6  CL 105  CO2 20  BUN 20  CREATININE 0.83  CALCIUM 8.9  GLUCOSE 234*   Cardiac Enzymes:  Recent Labs  04/02/13 0405 04/02/13 0853 04/02/13 1528  TROPONINI <0.30 <0.30 <0.30   BNP:   Radiology: Clear lungs, cardiomegaly  EKG: Initially atrial flutter with 2 to one block, bifascicular block. Followup EKG shows nonspecific ST changes, sinus rhythm following cardioversion and bifascicular block.  Discharge Medications:   Medication List    STOP taking these medications       BAYER ASPIRIN EC LOW DOSE 81 MG EC tablet  Generic  drug:  aspirin     naproxen sodium 220 MG tablet  Commonly known as:  ANAPROX      TAKE these medications       doxazosin 8 MG tablet  Commonly known as:  CARDURA  Take 8 mg by mouth daily.     glucose blood test strip  Commonly known as:  ONE TOUCH ULTRA TEST  1 each by Other route daily. And lancets 1/day 250.00      Liraglutide 18 MG/3ML Soln injection  Commonly known as:  VICTOZA  Inject 0.3 mLs (1.8 mg total) into the skin every evening.     metFORMIN 500 MG 24 hr tablet  Commonly known as:  GLUCOPHAGE-XR  Take 2,000 mg by mouth daily with breakfast. 4 tabs each morning     metoprolol tartrate 25 MG tablet  Commonly known as:  LOPRESSOR  Take 2 tablets (50 mg total) by mouth 2 (two) times daily.     potassium citrate 10 MEQ (1080 MG) SR tablet  Commonly known as:  UROCIT-K  Take 20 mEq by mouth daily.     pravastatin 40 MG tablet  Commonly known as:  PRAVACHOL  Take 40 mg by mouth every evening.     Rivaroxaban 20 MG Tabs  Commonly known as:  XARELTO  Take 1 tablet (20 mg total) by mouth daily with supper.     spironolactone 25 MG tablet  Commonly known as:  ALDACTONE  Take 25 mg by mouth daily.     tamsulosin 0.4 MG Caps  Commonly known as:  FLOMAX  Take 0.4 mg by mouth 2 (two) times a week. Sundays and Thursday mornings     valsartan-hydrochlorothiazide 160-25 MG per tablet  Commonly known as:  DIOVAN-HCT  Take 1 tablet by mouth daily.        Followup plans and appointments: Follow up with Dr. Donnie Aho in one week   Time spent with patient to include physician time: 30 minutes  Signed: W. Ashley Royalty. MD [facility] 04/03/2013, 8:41 AM

## 2013-04-03 NOTE — Progress Notes (Signed)
Ambulated along the hall way approx. 1500 ft tolerated well. Slight pain at the back which he claimed not new. V/s bp 181/94, hr- 75, r- 18 , pulse ox- 95 on RA  Sinus rhythm.

## 2013-04-03 NOTE — Progress Notes (Signed)
Discharged home accompanied by wife , discharged instructions and prescription given to pt. Belongings  taken home.

## 2013-04-09 ENCOUNTER — Encounter: Payer: Self-pay | Admitting: Cardiology

## 2013-04-09 DIAGNOSIS — Z7901 Long term (current) use of anticoagulants: Secondary | ICD-10-CM | POA: Insufficient documentation

## 2013-04-09 DIAGNOSIS — I4892 Unspecified atrial flutter: Secondary | ICD-10-CM

## 2013-04-09 NOTE — Progress Notes (Unsigned)
Patient ID: Talley Kreiser, male   DOB: 1951-02-10, 62 y.o.   MRN: [id number]  Draedyn, Weidinger  Date of visit:  04/09/2013 DOB:  07-15-1951    Age:  61 yrs. Medical record number:  [id number]     Account number:  [id number] Primary Care Provider: Burnell Blanks ____________________________ CURRENT DIAGNOSES  1. CAD,Native  2. Atrial flutter  3. Hypertensive Heart Disease-Benign without CHF  4. Long Term Use Anticoagulant  5. Conduction Disorder-RBBB w/LAFB  6. Aortic Valve-Regurgitation  7. Obesity(BMI30-40)  8. Diabetes Mellitus-NIDD  9. Atrial fibrillation ____________________________ ALLERGIES  NKDA ____________________________ MEDICATIONS  1. doxazosin 8 mg tablet, 1 p.o. daily  2. tamsulosin 0.4 mg capsule,extended release 24hr, twice a week (sunday and thursday)  3. pravastatin 40 mg tablet, 1 p.o. daily  4. metformin 500 mg tablet extended release 24 hr, 4 qam  5. Klor-Con 10 10 mEq tablet extended release, 1 p.o. daily  6. Victoza 2-Pak 0.6 mg/0.1 mL (18 mg/3 mL) pen injector, 18mg  qd  7. spironolactone 25 mg tablet, PRN  8. valsartan-hydrochlorothiazide 160-25 mg tablet, 1 p.o. daily  9. metoprolol tartrate 25 mg tablet, BID  10. Xarelto 20 mg tablet, 1 p.o. daily ____________________________ HISTORY OF PRESENT ILLNESS  Patient seen for cardiac followup. He was admitted to the hospital with rapid atrial flutter that was cardioverted. An echocardiogram done during rapid atrial flutter showed a depressed ejection fraction. He has felt well since discharge and denies angina, PND, orthopnea, syncope, or dyspnea. He does not have any recurrent arrhythmias. He has been checking his blood pressures at home and has been normotensive. ____________________________ PAST HISTORY  Past Medical Illnesses:  hypertension, DM-non-insulin dependent, obesity, kidney stones;  Cardiovascular Illnesses:  atrial flutter May 2014;  Infectious Diseases:  no previous history of significant infectious  diseases;  Surgical Procedures:  Knee surgery bilaterally, pilonidal cyst, tonsillectomy;  Trauma History:  no previous history of significant trauma;  Cardiology Procedures-Invasive:  cardiac cath (left) September 2012, cardioversion May 2014;  Cardiology Procedures-Noninvasive:  treadmill cardiolite September 2012, echocardiogram April 2013, echocardiogram May 2014;  Cardiac Cath Results:  normal Left main, 50% stenosis mid LAD, scattered irregularities CFX, scattered irregularities RCA;  LVEF of 55% documented via echocardiogram on 02/27/2012,   CHA2DS2-VASC Score:  2 ____________________________ CARDIO-PULMONARY TEST DATES EKG Date:  04/09/2013;   Cardiac Cath Date:  08/17/2011;  Nuclear Study Date:  07/27/2011;  Echocardiography Date: 04/02/2013;  Chest Xray Date: 04/01/2013;   ____________________________ SOCIAL HISTORY Alcohol Use:  occasionally;  Smoking:  used to smoke but quit 1985;  Diet:  regular diet;  Lifestyle:  married and 2 children;  Exercise:  no regular exercise;  Occupation:  Surveyor, minerals;  Residence:  lives with wife;   ____________________________ REVIEW OF SYSTEMS General:  denies recent weight change, fatique or change in exercise tolerance. Eyes: wears eye glasses/contact lenses, denies diplopia, glaucoma or visual field defects. Respiratory: denies dyspnea, cough, wheezing or hemoptysis. Cardiovascular:  please review HPI Abdominal: denies dyspepsia, GI bleeding, constipation, or diarrhea Genitourinary-Male: erectile dysfunction, tried Viagra but no longer uses, nocturia  Musculoskeletal:  osteoarthritis knees, chronic low back pain  ____________________________ PHYSICAL EXAMINATION VITAL SIGNS  Blood Pressure:  140/70 Sitting, Right arm, large cuff  , 142/74 Standing, Right arm and large cuff   Pulse:  76/min. Weight:  264.00 lbs. Height:  72"BMI: 36  Constitutional:  pleasant white male in no acute distress, moderately obese Skin:  warm and dry to  touch, no apparent skin lesions, or masses  noted. Head:  normocephalic, normal hair pattern, no masses or tenderness Neck:  supple, without massess. No JVD, thyromegaly or carotid bruits. Carotid upstroke normal. Chest:  normal symmetry, clear to auscultation and percussion. Cardiac:  regular rhythm, normal S1 and S2, no S3 or S4, grade 1/6 systolic murmur Extremities & Back:  no deformities, clubbing, cyanosis, erythema or edema observed. Normal muscle strength and tone. Neurological:  no gross motor or sensory deficits noted, affect appropriate, oriented x3. ____________________________ MOST RECENT LIPID PANEL 08/22/12  CHOL TOTL 135 mg/dl, LDL 78 calc, HDL 32 mg/dl and TRIGLYCER 161 mg/dl ____________________________ IMPRESSIONS/PLAN  1. Recent atrial flutter with rapid ventricular response that has resolved 2. Recently depressed ejection fraction occurring during the setting of rapid atrial fibrillation 3. Significant hypertensive heart disease 4. Long-term anticoagulation with Xarelto  Recommendations:  Continue Xarelto at this time. I am going to repeat his echocardiogram in 2 months to determine if he has had improvement in his ejection fraction. Discussed importance of weight loss and regular exercise.  ____________________________ TODAYS ORDERS  1. Return Visit: 2 months  2. 2D, color flow, doppler: 2 months  3. 12 Lead EKG: Today                       ____________________________ Cardiology Physician:  Darden Palmer MD [facility]

## 2013-04-22 ENCOUNTER — Encounter: Payer: Self-pay | Admitting: Endocrinology

## 2013-04-22 ENCOUNTER — Ambulatory Visit (INDEPENDENT_AMBULATORY_CARE_PROVIDER_SITE_OTHER): Payer: BC Managed Care – PPO | Admitting: Endocrinology

## 2013-04-22 VITALS — BP 126/72 | HR 80 | Ht 71.0 in | Wt 267.0 lb

## 2013-04-22 DIAGNOSIS — E119 Type 2 diabetes mellitus without complications: Secondary | ICD-10-CM

## 2013-04-22 DIAGNOSIS — E1129 Type 2 diabetes mellitus with other diabetic kidney complication: Secondary | ICD-10-CM

## 2013-04-22 MED ORDER — CANAGLIFLOZIN 300 MG PO TABS: 1.0000 | ORAL_TABLET | Freq: Every day | ORAL | Status: DC

## 2013-04-22 NOTE — Patient Instructions (Addendum)
check your blood sugar once a day.  vary the time of day when you check, between before the 3 meals, and at bedtime.  also check if you have symptoms of your blood sugar being too high or too low.  please keep a record of the readings and bring it to your next appointment here.  please call us sooner if you are having low blood sugar episodes, or if it is running above 150.   Please come back for a follow-up appointment in 3 months.   i have sent a prescription to your pharmacy, to add "invokana."

## 2013-04-22 NOTE — Progress Notes (Signed)
Subjective:    Patient ID: Matthew Schmitt, male    DOB: 1951-02-14, 62 y.o.   MRN: [id number]  HPI Pt returns for f/u if type 2 DM (dx'ed 2002; He has mild if any neuropathy of the lower extremities.  He has associated nephropathy, h/o leg ulcers, and CAD).  pt states he feels better since recent hospitalization.   He takes aldactone only prn. Past Medical History  Diagnosis Date  . Impotence of organic origin   . Mixed hyperlipidemia   . Essential hypertension, benign   . Type II or unspecified type diabetes mellitus without mention of complication, uncontrolled   . Arthritis of both knees   . History of kidney stones   . RBBB   . Coronary artery disease     Past Surgical History  Procedure Laterality Date  . Knee surgery  1978  &  1974    BILATERAL  . Cardiac catheterization  08-17-2011  DR SPENCER TILLEY    POSSIBLE MODERATE LAD STENOSIS JUST AFTER THE BIFURCATION OF LARGE DIAGONAL BRANCH/ LVEF  40-45%/ MILD GLOBAL HYPODINESIS (CATH DONE FOR ABNORMAL STRESS TEST OF FIXED LATERAL WALL DEFECT & BIFASCICULAR BLAOCK)  . Tonsillectomy  54  (age 46)  . Cystoscopy with retrograde pyelogram, ureteroscopy and stent placement  11/03/2012    Procedure: CYSTOSCOPY WITH RETROGRADE PYELOGRAM, URETEROSCOPY AND STENT PLACEMENT;  Surgeon: Valetta Fuller, MD;  Location: [facility];  Service: Urology;  Laterality: Left;  Cystoscopy/Left Retrograde/Ureteroscopy/Holmium Laser Litho/Double J Stent  . Holmium laser application  11/03/2012    Procedure: HOLMIUM LASER APPLICATION;  Surgeon: Valetta Fuller, MD;  Location: [facility];  Service: Urology;  Laterality: Left;  . Cardioversion N/A 04/02/2013    Procedure: CARDIOVERSION;  Surgeon: Othella Boyer, MD;  [facility];  Service: Cardiovascular;  Laterality: N/A;    History   Social History  . Marital Status: Married    Spouse Name: N/A    Number of Children: N/A  . Years of Education: 16   Occupational  History  . Emergency planning/management officer, Mining engineer    Social History Main Topics  . Smoking status: Former Smoker -- 1.00 packs/day for 6 years    Types: Cigarettes    Quit date: 10/31/1969  . Smokeless tobacco: Never Used     Comment: QUIT SMOKING IN 1970'S  . Alcohol Use: Yes     Comment: OCCASIONAL  . Drug Use: No  . Sexually Active: Not on file   Other Topics Concern  . Not on file   Social History Narrative   Regular exercise-no    Current Outpatient Prescriptions on File Prior to Visit  Medication Sig Dispense Refill  . doxazosin (CARDURA) 8 MG tablet Take 8 mg by mouth daily.       Marland Kitchen glucose blood (ONE TOUCH ULTRA TEST) test strip 1 each by Other route daily. And lancets 1/day 250.00  100 each  12  . Liraglutide (VICTOZA) 18 MG/3ML SOLN injection Inject 0.3 mLs (1.8 mg total) into the skin every evening.  9 mL  3  . metFORMIN (GLUCOPHAGE-XR) 500 MG 24 hr tablet Take 2,000 mg by mouth daily with breakfast. 4 tabs each morning      . metoprolol tartrate (LOPRESSOR) 25 MG tablet Take 2 tablets (50 mg total) by mouth 2 (two) times daily.  60 tablet  12  . potassium citrate (UROCIT-K) 10 MEQ (1080 MG) SR tablet Take 20 mEq by mouth daily.       Marland Kitchen  pravastatin (PRAVACHOL) 40 MG tablet Take 40 mg by mouth every evening.       . Rivaroxaban (XARELTO) 20 MG TABS Take 1 tablet (20 mg total) by mouth daily with supper.  30 tablet  12  . spironolactone (ALDACTONE) 25 MG tablet Take 25 mg by mouth daily.      . Tamsulosin HCl (FLOMAX) 0.4 MG CAPS Take 0.4 mg by mouth 2 (two) times a week. Sundays and Thursday mornings      . valsartan-hydrochlorothiazide (DIOVAN-HCT) 160-25 MG per tablet Take 1 tablet by mouth daily.        No current facility-administered medications on file prior to visit.    Allergies  Allergen Reactions  . Other Cough    Blood pressure medication-reaction was about 18 years ago  . Pioglitazone     edema    Family History  Problem Relation Age of Onset  .  Arthritis Other     Parents  . Hypertension Other     Parents  . Diabetes Other     Parents    BP 126/72  Pulse 80  Ht 5\' 11"  (1.803 m)  Wt 267 lb (121.11 kg)  BMI 37.26 kg/m2  SpO2 98%  Review of Systems denies hypoglycemia and weight change.    Objective:   Physical Exam VITAL SIGNS:  See vs page GENERAL: no distress   Lab Results  Component Value Date   HGBA1C 7.7* 04/02/2013      Assessment & Plan:  We discussed the nine oral agents available for type 2 diabetes.  This regimen gives the best risk-benefit ratio.  He needs increased rx. Edema.  The invokana will reduce the need for aldactone.

## 2013-05-01 ENCOUNTER — Other Ambulatory Visit (HOSPITAL_COMMUNITY): Payer: Self-pay | Admitting: Urology

## 2013-05-01 DIAGNOSIS — C649 Malignant neoplasm of unspecified kidney, except renal pelvis: Secondary | ICD-10-CM

## 2013-05-06 ENCOUNTER — Ambulatory Visit (HOSPITAL_COMMUNITY)
Admission: RE | Admit: 2013-05-06 | Discharge: 2013-05-06 | Disposition: A | Discharge status: Home / Self Care | Payer: BC Managed Care – PPO | Source: Ambulatory Visit | Attending: Urology | Admitting: Urology

## 2013-05-06 DIAGNOSIS — N281 Cyst of kidney, acquired: Secondary | ICD-10-CM | POA: Insufficient documentation

## 2013-05-06 DIAGNOSIS — C649 Malignant neoplasm of unspecified kidney, except renal pelvis: Secondary | ICD-10-CM

## 2013-05-06 DIAGNOSIS — N289 Disorder of kidney and ureter, unspecified: Secondary | ICD-10-CM | POA: Insufficient documentation

## 2013-05-06 MED ORDER — GADOBENATE DIMEGLUMINE 529 MG/ML IV SOLN
20.0000 mL | Freq: Once | INTRAVENOUS | Status: AC | PRN
  Administered 2013-05-06: 20 mL via INTRAVENOUS

## 2013-05-26 ENCOUNTER — Encounter: Payer: Self-pay | Admitting: Family Medicine

## 2013-06-02 ENCOUNTER — Other Ambulatory Visit (HOSPITAL_COMMUNITY): Payer: Self-pay | Admitting: Urology

## 2013-06-02 ENCOUNTER — Ambulatory Visit (HOSPITAL_COMMUNITY)
Admission: RE | Admit: 2013-06-02 | Discharge: 2013-06-02 | Disposition: A | Discharge status: Home / Self Care | Payer: BC Managed Care – PPO | Source: Ambulatory Visit | Attending: Urology | Admitting: Urology

## 2013-06-02 DIAGNOSIS — I2510 Atherosclerotic heart disease of native coronary artery without angina pectoris: Secondary | ICD-10-CM | POA: Insufficient documentation

## 2013-06-02 DIAGNOSIS — I10 Essential (primary) hypertension: Secondary | ICD-10-CM | POA: Insufficient documentation

## 2013-06-02 DIAGNOSIS — D4959 Neoplasm of unspecified behavior of other genitourinary organ: Secondary | ICD-10-CM

## 2013-06-02 DIAGNOSIS — E119 Type 2 diabetes mellitus without complications: Secondary | ICD-10-CM | POA: Insufficient documentation

## 2013-06-04 ENCOUNTER — Other Ambulatory Visit: Payer: Self-pay | Admitting: Urology

## 2013-06-11 ENCOUNTER — Other Ambulatory Visit (HOSPITAL_COMMUNITY): Payer: Self-pay | Admitting: *Deleted

## 2013-06-11 ENCOUNTER — Encounter (HOSPITAL_COMMUNITY): Payer: Self-pay | Admitting: Pharmacy Technician

## 2013-06-12 ENCOUNTER — Encounter (HOSPITAL_COMMUNITY)
Admission: RE | Admit: 2013-06-12 | Discharge: 2013-06-12 | Disposition: A | Discharge status: Home / Self Care | Payer: BC Managed Care – PPO | Source: Ambulatory Visit | Attending: Urology | Admitting: Urology

## 2013-06-12 ENCOUNTER — Encounter (HOSPITAL_COMMUNITY): Payer: Self-pay

## 2013-06-12 DIAGNOSIS — Z01812 Encounter for preprocedural laboratory examination: Secondary | ICD-10-CM | POA: Insufficient documentation

## 2013-06-12 HISTORY — DX: Unspecified osteoarthritis, unspecified site: M19.90

## 2013-06-12 LAB — CBC
HCT: 44 % (ref 39.0–52.0)
Hemoglobin: 14.8 g/dL (ref 13.0–17.0)
MCH: 28.1 pg (ref 26.0–34.0)
MCHC: 33.6 g/dL (ref 30.0–36.0)
MCV: 83.5 fL (ref 78.0–100.0)
Platelets: 157 10*3/uL (ref 150–400)
RBC: 5.27 MIL/uL (ref 4.22–5.81)
RDW: 13.2 % (ref 11.5–15.5)
WBC: 4.3 10*3/uL (ref 4.0–10.5)

## 2013-06-12 LAB — BASIC METABOLIC PANEL
BUN: 22 mg/dL (ref 6–23)
CO2: 29 mEq/L (ref 19–32)
Calcium: 9.8 mg/dL (ref 8.4–10.5)
Chloride: 103 mEq/L (ref 96–112)
Creatinine, Ser: 0.93 mg/dL (ref 0.50–1.35)
GFR calc Af Amer: >90 mL/min (ref >90)
GFR calc non Af Amer: 88 mL/min — ABNORMAL LOW (ref >90)
Glucose, Bld: 149 mg/dL — ABNORMAL HIGH (ref 70–99)
Potassium: 4.1 mEq/L (ref 3.5–5.1)
Sodium: 140 mEq/L (ref 135–145)

## 2013-06-12 LAB — ABO/RH: ABO/RH(D): A POS

## 2013-06-12 NOTE — Progress Notes (Addendum)
EKG 04/09/13 on chart, ECHO 06/03/13 on chart, LOV note 06/03/13 Dr. Donnie Aho on chart, Chest x-ray 06/02/13 on EPIC.

## 2013-06-12 NOTE — Progress Notes (Signed)
06/12/13 0816  OBSTRUCTIVE SLEEP APNEA  Have you ever been diagnosed with sleep apnea through a sleep study? No  Do you snore loudly (loud enough to be heard through closed doors)?  1  Do you often feel tired, fatigued, or sleepy during the daytime? 0  Has anyone observed you stop breathing during your sleep? 0  Do you have, or are you being treated for high blood pressure? 1  BMI more than 35 kg/m2? 0  Age over 63 years old? 1  Neck circumference greater than 40 cm/18 inches? 0  Gender: 1  Obstructive Sleep Apnea Score 4  Score 4 or greater  Results sent to PCP

## 2013-06-12 NOTE — Patient Instructions (Addendum)
20 Eaden Hettinger  06/12/2013   Your procedure is scheduled on: 06/18/13  Report to [facility] at 5:15 AM.  Call this number if you have problems the morning of surgery [phone]   Remember:please do not take any diabetic medicine on the day of surgery   Do not eat food or drink liquids After Midnight.     Take these medicines the morning of surgery with A SIP OF WATER: metoprolol   Do not wear jewelry, make-up or nail polish.  Do not wear lotions, powders, or perfumes. You may wear deodorant.  Do not shave 48 hours prior to surgery. Men may shave face and neck.  Do not bring valuables to the hospital.  Contacts, dentures or bridgework may not be worn into surgery.  Leave suitcase in the car. After surgery it may be brought to your room.  For patients admitted to the hospital, checkout time is 11:00 AM the day of discharge.    Please read over the following fact sheets that you were given: blood fact sheet, incentive spirometry fact sheet.  Birdie Sons, RN  pre op nurse call if needed [phone]    FAILURE TO FOLLOW THESE INSTRUCTIONS MAY RESULT IN CANCELLATION OF YOUR SURGERY   Patient Signature: ___________________________________________

## 2013-06-17 NOTE — H&P (Signed)
Chief Complaint  Left renal neoplasm   Reason For Visit  Reason for consult: To discuss minimally invasive nephron sparing surgery for his left renal neoplasm. Physician requesting consult Dr. Barron Alvine PCP:   History of Present Illness  Matthew Schmitt is a 62 year old long-time patient of Dr. Isabel Caprice with a long history of calcium oxalate and uric acid urolithiasis.  He most recently was treated with left ureteroscopy for a large 1.2 cm ureteral stone in December 2013.  At that time, he had undergone a CT scan which demonstrated significant remaining burden of renal calculi bilaterally.  He was placed on pH manipulation therapy and a repeat CT scan in June 2014 demonstrated a significant decrease in the left renal stone burden.  However, a new partially exophytic mass was noted on the anterior lower pole of the left kidney which had not been previously identified.  Of note, he did still have a remaining large right renal stone burden.  Due to the finding on CT scan, he underwent an MRI with and without contrast for further evaluation and was confirmed to have a 2.2 x 1.5 cm enhancing left renal mass off the anterior aspect of the lower pole of the left kidney concerning for renal cell carcinoma.  Other findings included a small left adrenal mass consistent with an adenoma by imaging criteria.  There are also bilateral renal cysts.  There was no evidence to suggest renal vein or IVC involvement, regional lymphadenopathy, or other metastatic disease to the abdomen.  He has no family history of kidney cancer and denies any hematuria except for what he has experienced with his stone disease.  His comorbidities include a history of atrial fibrillation and he was recently started on Xarelto.  He apparently developed atrial fibrillation with rapid ventricular response approximately 3 months ago requiring cardioversion.  He has been maintained on anticoagulation since.  He was scheduled follow-up for a repeat  echocardiogram with Dr. Donnie Schmitt.  Considering his upcoming surgery, this evaluation has been moved up and he is scheduled to see cardiology tomorrow.      Past Medical History Problems  1. History of  Arthritis V13.4 2. History of  Atrial Fibrillation 427.31 3. History of  Diabetes Mellitus 250.00 4. History of  Hypertension 401.9  Surgical History Problems  1. History of  Cystoscopy With Insertion Of Ureteral Stent Left 2. History of  Cystoscopy With Ureteroscopy With Lithotripsy 3. History of  Knee Surgery 4. History of  Procedures Of The Spine 5. History of  Tonsillectomy  Current Meds 1. Diovan HCT 160-12.5 MG Oral Tablet; Therapy: 01Dec2010 to 2. Doxazosin Mesylate 8 MG Oral Tablet; Therapy: 01Dec2010 to 3. Invokana 300 MG Oral Tablet; Therapy: (Recorded:12Jun2014) to 4. MetFORMIN HCl TABS; TAKE 1 TABLET DAILY WITH FOOD; Therapy: (Recorded:31Aug2012) to 5. Metoprolol Tartrate 25 MG Oral Tablet; Therapy: 14Sep2012 to 6. Parlodel 2.5 MG Oral Tablet; Therapy: (Recorded:12Jun2014) to 7. Potassium Citrate ER 10 MEQ (1080 MG) Oral Tablet Extended Release; TAKE 2 TABLETS BY  MOUTH EVERY DAY; Therapy: 03Mar2011 to (Evaluate:10Oct2014)  Requested for: 12Jun2014;  Last Rx:12Jun2014 8. Pravastatin Sodium 40 MG Oral Tablet; Therapy: 14Sep2012 to 9. Tamsulosin HCl 0.4 MG Oral Capsule; TAKE ONE CAPSULE BY MOUTH EVERY DAY; Therapy:  27Dec2011 to (Evaluate:30Aug2014)  Requested for: 03Mar2014; Last Rx:03Mar2014 10. Victoza SOLN; Therapy: (Recorded:22Feb2013) to 11. Xarelto 20 MG Oral Tablet; Therapy: (Recorded:12Jun2014) to  Allergies Medication  1. No Known Drug Allergies  Family History Denied  1. Family history of  Kidney Cancer  Social History Problems    Alcohol Use 2 beer a month   Former Smoker nonsmoker for the past 20-76yrs   Marital History - Currently Married   Occupation: Photographer  Review of Systems Constitutional, skin, eye, otolaryngeal,  hematologic/lymphatic, cardiovascular, pulmonary, endocrine, musculoskeletal, gastrointestinal, neurological and psychiatric system(s) were reviewed and pertinent findings if present are noted.  Cardiovascular: no chest pain and no leg swelling.  Respiratory: no shortness of breath and no cough.    Vitals Vital Signs [Data Includes: Last 1 Day]  15Jul2014 02:05PM  BMI Calculated: 35.49 BSA Calculated: 2.39 Height: 6 ft  Weight: 262 lb  Blood Pressure: 129 / 65 Temperature: 98.3 F Heart Rate: 85  Physical Exam Constitutional: Well nourished and well developed . No acute distress.  ENT:. The ears and nose are normal in appearance.  Neck: The appearance of the neck is normal and no neck mass is present.  Pulmonary: No respiratory distress, normal respiratory rhythm and effort and clear bilateral breath sounds.  Cardiovascular: Heart rate and rhythm are normal . No peripheral edema.  Abdomen: The abdomen is soft and nontender. No masses are palpated. No CVA tenderness. No hernias are palpable. No hepatosplenomegaly noted.  Lymphatics: The supraclavicular, femoral and inguinal nodes are not enlarged or tender.  Skin: Normal skin turgor, no visible rash and no visible skin lesions.  Neuro/Psych:. Mood and affect are appropriate.    Results/Data Urine [Data Includes: Last 1 Day]   15Jul2014  COLOR YELLOW   APPEARANCE CLEAR   SPECIFIC GRAVITY 1.020   pH 5.5   GLUCOSE > 1000 mg/dL  BILIRUBIN NEG   KETONE NEG mg/dL  BLOOD LARGE   PROTEIN NEG mg/dL  UROBILINOGEN 0.2 mg/dL  NITRITE NEG   LEUKOCYTE ESTERASE NEG   SQUAMOUS EPITHELIAL/HPF RARE   WBC 0-2 WBC/hpf  RBC 21-50 RBC/hpf  BACTERIA FEW   CRYSTALS NONE SEEN   CASTS NONE SEEN    Assessment Assessed  1. Possible  Renal Cell Carcinoma 189.0  Plan Health Maintenance (V70.0)  1. UA With REFLEX  Done: 15Jul2014 02:01PM Nephrolithiasis (592.0)  2. URINE CULTURE  Done: 15Jul2014 Renal Neoplasm (239.5)  3. CHEST X-RAY   Requested for: 15Jul2014 4. COMPREHENSIVE METABOLIC PANEL  Requested for: 15Jul2014 5. Follow-up Office  Follow-up  Requested for: 15Jul2014  Discussion/Summary  1.  Left renal neoplasm concerning for malignancy: He will complete his metastatic evaluation with chest imaging and laboratory studies today.  We have reviewed the concern of his mass suggests concern for renal cell carcinoma.   The patient was provided information regarding their renal mass including the relative risk of benign versus malignant pathology and the natural history of renal cell carcinoma and other possible malignancies of the kidney. The role of renal biopsy, laboratory testing, and imaging studies to further characterize renal masses and/or the presence of metastatic disease were explained. We discussed the role of active surveillance, surgical therapy with both radical nephrectomy and nephron-sparing surgery, and ablative therapy in the treatment of renal masses. In addition, we discussed our goals of providing an accurate diagnosis and oncologic control while maintaining optimal renal function as appropriate based on the size, location, and complexity of their renal mass as well as their co-morbidities.    We have discussed the risks of treatment in detail including but not limited to bleeding, infection, heart attack, stroke, death, venothromoboembolism, cancer recurrence, injury/damage to surrounding organs and structures, urine leak, the possibility of open surgical conversion for patients undergoing minimally invasive surgery, the  risk of developing chronic kidney disease and its associated implications, and the potential risk of end stage renal disease possibly necessitating dialysis.   After reviewing options for management/treatment, he has elected to proceed with nephron sparing surgery.  He will be scheduled for a left robotic-assisted laparoscopic partial nephrectomy.  He is scheduled to have an echocardiogram  performed tomorrow and assuming that he is felt to be at relatively low risk perioperatively from a cardiac standpoint, my plan would be to stop his anticoagulation 48-72 hours prior to surgery and resume within 5-7 days from his surgery assuming no concern for postoperative bleeding.  Cc: Dr. Barron Alvine Dr. Viann Fish

## 2013-06-18 ENCOUNTER — Encounter (HOSPITAL_COMMUNITY): Payer: Self-pay | Admitting: *Deleted

## 2013-06-18 ENCOUNTER — Encounter (HOSPITAL_COMMUNITY)
Admission: RE | Disposition: A | Discharge status: Home / Self Care | Payer: Self-pay | Source: Ambulatory Visit | Attending: Urology

## 2013-06-18 ENCOUNTER — Inpatient Hospital Stay (HOSPITAL_COMMUNITY)
Admission: RE | Admit: 2013-06-18 | Discharge: 2013-06-20 | DRG: 303 | Disposition: A | Discharge status: Home / Self Care | Payer: BC Managed Care – PPO | Source: Ambulatory Visit | Attending: Urology | Admitting: Urology

## 2013-06-18 ENCOUNTER — Ambulatory Visit (HOSPITAL_COMMUNITY): Payer: BC Managed Care – PPO | Admitting: Registered Nurse

## 2013-06-18 ENCOUNTER — Encounter (HOSPITAL_COMMUNITY): Payer: Self-pay | Admitting: Registered Nurse

## 2013-06-18 DIAGNOSIS — D3002 Benign neoplasm of left kidney: Secondary | ICD-10-CM

## 2013-06-18 DIAGNOSIS — Z01812 Encounter for preprocedural laboratory examination: Secondary | ICD-10-CM

## 2013-06-18 DIAGNOSIS — I4891 Unspecified atrial fibrillation: Secondary | ICD-10-CM | POA: Diagnosis present

## 2013-06-18 DIAGNOSIS — D3000 Benign neoplasm of unspecified kidney: Principal | ICD-10-CM | POA: Diagnosis present

## 2013-06-18 DIAGNOSIS — Z87442 Personal history of urinary calculi: Secondary | ICD-10-CM

## 2013-06-18 DIAGNOSIS — H5710 Ocular pain, unspecified eye: Secondary | ICD-10-CM | POA: Diagnosis not present

## 2013-06-18 DIAGNOSIS — N201 Calculus of ureter: Secondary | ICD-10-CM

## 2013-06-18 DIAGNOSIS — E119 Type 2 diabetes mellitus without complications: Secondary | ICD-10-CM | POA: Diagnosis present

## 2013-06-18 DIAGNOSIS — I10 Essential (primary) hypertension: Secondary | ICD-10-CM | POA: Diagnosis present

## 2013-06-18 DIAGNOSIS — Z79899 Other long term (current) drug therapy: Secondary | ICD-10-CM

## 2013-06-18 DIAGNOSIS — Z87891 Personal history of nicotine dependence: Secondary | ICD-10-CM

## 2013-06-18 DIAGNOSIS — N200 Calculus of kidney: Secondary | ICD-10-CM | POA: Diagnosis present

## 2013-06-18 DIAGNOSIS — D3500 Benign neoplasm of unspecified adrenal gland: Secondary | ICD-10-CM | POA: Diagnosis present

## 2013-06-18 DIAGNOSIS — M129 Arthropathy, unspecified: Secondary | ICD-10-CM | POA: Diagnosis present

## 2013-06-18 DIAGNOSIS — Q619 Cystic kidney disease, unspecified: Secondary | ICD-10-CM

## 2013-06-18 HISTORY — PX: ROBOTIC ASSITED PARTIAL NEPHRECTOMY: SHX6087

## 2013-06-18 HISTORY — DX: Benign neoplasm of left kidney: D30.02

## 2013-06-18 LAB — GLUCOSE, CAPILLARY
Glucose-Capillary: 154 mg/dL — ABNORMAL HIGH (ref 70–99)
Glucose-Capillary: 179 mg/dL — ABNORMAL HIGH (ref 70–99)
Glucose-Capillary: 199 mg/dL — ABNORMAL HIGH (ref 70–99)
Glucose-Capillary: 206 mg/dL — ABNORMAL HIGH (ref 70–99)
Glucose-Capillary: 207 mg/dL — ABNORMAL HIGH (ref 70–99)
Glucose-Capillary: 244 mg/dL — ABNORMAL HIGH (ref 70–99)

## 2013-06-18 LAB — BASIC METABOLIC PANEL
BUN: 13 mg/dL (ref 6–23)
CO2: 25 mEq/L (ref 19–32)
Calcium: 8.2 mg/dL — ABNORMAL LOW (ref 8.4–10.5)
Chloride: 103 mEq/L (ref 96–112)
Creatinine, Ser: 0.84 mg/dL (ref 0.50–1.35)
GFR calc Af Amer: >90 mL/min (ref >90)
GFR calc non Af Amer: >90 mL/min (ref >90)
Glucose, Bld: 217 mg/dL — ABNORMAL HIGH (ref 70–99)
Potassium: 3.8 mEq/L (ref 3.5–5.1)
Sodium: 138 mEq/L (ref 135–145)

## 2013-06-18 LAB — HEMOGLOBIN AND HEMATOCRIT, BLOOD
HCT: 39.9 % (ref 39.0–52.0)
Hemoglobin: 13.5 g/dL (ref 13.0–17.0)

## 2013-06-18 LAB — TYPE AND SCREEN
ABO/RH(D): A POS
Antibody Screen: NEGATIVE

## 2013-06-18 SURGERY — ROBOTIC ASSITED PARTIAL NEPHRECTOMY
Anesthesia: General | Laterality: Left | Wound class: Clean

## 2013-06-18 MED ORDER — BUPIVACAINE LIPOSOME 1.3 % IJ SUSP
20.0000 mL | Freq: Once | INTRAMUSCULAR | Status: DC
  Filled 2013-06-18: qty 20

## 2013-06-18 MED ORDER — NEOSTIGMINE METHYLSULFATE 1 MG/ML IJ SOLN
INTRAMUSCULAR | Status: DC | PRN
  Administered 2013-06-18: 5 mg via INTRAVENOUS

## 2013-06-18 MED ORDER — TAMSULOSIN HCL 0.4 MG PO CAPS
0.4000 mg | ORAL_CAPSULE | ORAL | Status: DC
  Administered 2013-06-18: 0.4 mg via ORAL
  Filled 2013-06-18: qty 1

## 2013-06-18 MED ORDER — LACTATED RINGERS IV SOLN
INTRAVENOUS | Status: DC | PRN
  Administered 2013-06-18: 07:00:00 via INTRAVENOUS

## 2013-06-18 MED ORDER — BUPIVACAINE LIPOSOME 1.3 % IJ SUSP
INTRAMUSCULAR | Status: DC | PRN
  Administered 2013-06-18: 20 mL

## 2013-06-18 MED ORDER — DEXAMETHASONE SODIUM PHOSPHATE 10 MG/ML IJ SOLN
INTRAMUSCULAR | Status: DC | PRN
  Administered 2013-06-18: 8 mg via INTRAVENOUS

## 2013-06-18 MED ORDER — DEXTROSE-NACL 5-0.45 % IV SOLN
INTRAVENOUS | Status: DC
  Administered 2013-06-18 – 2013-06-19 (×3): via INTRAVENOUS

## 2013-06-18 MED ORDER — HYDROMORPHONE HCL PF 1 MG/ML IJ SOLN
INTRAMUSCULAR | Status: AC
  Filled 2013-06-18: qty 1

## 2013-06-18 MED ORDER — DOCUSATE SODIUM 100 MG PO CAPS
100.0000 mg | ORAL_CAPSULE | Freq: Two times a day (BID) | ORAL | Status: DC
  Administered 2013-06-18 – 2013-06-20 (×5): 100 mg via ORAL
  Filled 2013-06-18 (×6): qty 1

## 2013-06-18 MED ORDER — MANNITOL 25 % IV SOLN
25.0000 g | Freq: Once | INTRAVENOUS | Status: DC
  Filled 2013-06-18: qty 100

## 2013-06-18 MED ORDER — MEPERIDINE HCL 50 MG/ML IJ SOLN: 6.2500 mg | INTRAMUSCULAR | Status: DC | PRN

## 2013-06-18 MED ORDER — EPHEDRINE SULFATE 50 MG/ML IJ SOLN
INTRAMUSCULAR | Status: DC | PRN
  Administered 2013-06-18 (×2): 7.5 mg via INTRAVENOUS

## 2013-06-18 MED ORDER — SIMVASTATIN 20 MG PO TABS
20.0000 mg | ORAL_TABLET | Freq: Every day | ORAL | Status: DC
  Administered 2013-06-18 – 2013-06-20 (×3): 20 mg via ORAL
  Filled 2013-06-18 (×3): qty 1

## 2013-06-18 MED ORDER — LIDOCAINE HCL (CARDIAC) 20 MG/ML IV SOLN
INTRAVENOUS | Status: DC | PRN
  Administered 2013-06-18: 100 mg via INTRAVENOUS

## 2013-06-18 MED ORDER — BROMOCRIPTINE MESYLATE 2.5 MG PO TABS
2.5000 mg | ORAL_TABLET | Freq: Every day | ORAL | Status: DC
  Administered 2013-06-19 – 2013-06-20 (×2): 2.5 mg via ORAL
  Filled 2013-06-18 (×4): qty 1

## 2013-06-18 MED ORDER — STERILE WATER FOR IRRIGATION IR SOLN
Status: DC | PRN
  Administered 2013-06-18: 1500 mL

## 2013-06-18 MED ORDER — GLYCOPYRROLATE 0.2 MG/ML IJ SOLN
INTRAMUSCULAR | Status: DC | PRN
  Administered 2013-06-18: .8 mg via INTRAVENOUS

## 2013-06-18 MED ORDER — PHENYLEPHRINE HCL 10 MG/ML IJ SOLN
INTRAMUSCULAR | Status: DC | PRN
  Administered 2013-06-18: 80 ug via INTRAVENOUS
  Administered 2013-06-18: 40 ug via INTRAVENOUS

## 2013-06-18 MED ORDER — HYDROMORPHONE HCL PF 1 MG/ML IJ SOLN
0.2500 mg | INTRAMUSCULAR | Status: DC | PRN
  Administered 2013-06-18 (×4): 0.5 mg via INTRAVENOUS

## 2013-06-18 MED ORDER — PHENYLEPHRINE HCL 10 MG/ML IJ SOLN
20.0000 mg | INTRAMUSCULAR | Status: DC | PRN
  Administered 2013-06-18: 15 ug/min via INTRAVENOUS

## 2013-06-18 MED ORDER — FENTANYL CITRATE 0.05 MG/ML IJ SOLN
INTRAMUSCULAR | Status: DC | PRN
  Administered 2013-06-18: 100 ug via INTRAVENOUS
  Administered 2013-06-18 (×3): 50 ug via INTRAVENOUS

## 2013-06-18 MED ORDER — ROCURONIUM BROMIDE 100 MG/10ML IV SOLN
INTRAVENOUS | Status: DC | PRN
  Administered 2013-06-18: 50 mg via INTRAVENOUS
  Administered 2013-06-18 (×2): 10 mg via INTRAVENOUS
  Administered 2013-06-18 (×2): 20 mg via INTRAVENOUS

## 2013-06-18 MED ORDER — PHENOL 1.4 % MT LIQD
1.0000 | OROMUCOSAL | Status: DC | PRN
  Filled 2013-06-18: qty 177

## 2013-06-18 MED ORDER — ZOLPIDEM TARTRATE 5 MG PO TABS: 5.0000 mg | ORAL_TABLET | Freq: Every evening | ORAL | Status: DC | PRN

## 2013-06-18 MED ORDER — ONDANSETRON HCL 4 MG/2ML IJ SOLN
INTRAMUSCULAR | Status: DC | PRN
  Administered 2013-06-18: 4 mg via INTRAVENOUS

## 2013-06-18 MED ORDER — METOPROLOL TARTRATE 25 MG PO TABS
25.0000 mg | ORAL_TABLET | Freq: Every morning | ORAL | Status: DC
  Administered 2013-06-18 – 2013-06-20 (×3): 25 mg via ORAL
  Filled 2013-06-18 (×3): qty 1

## 2013-06-18 MED ORDER — MENTHOL 3 MG MT LOZG
1.0000 | LOZENGE | OROMUCOSAL | Status: DC | PRN
  Filled 2013-06-18: qty 9

## 2013-06-18 MED ORDER — BUPIVACAINE-EPINEPHRINE 0.25% -1:200000 IJ SOLN
INTRAMUSCULAR | Status: AC
  Filled 2013-06-18: qty 1

## 2013-06-18 MED ORDER — CEFAZOLIN SODIUM 1-5 GM-% IV SOLN
1.0000 g | Freq: Three times a day (TID) | INTRAVENOUS | Status: AC
  Administered 2013-06-18 (×2): 1 g via INTRAVENOUS
  Filled 2013-06-18 (×2): qty 50

## 2013-06-18 MED ORDER — LACTATED RINGERS IV SOLN: INTRAVENOUS | Status: DC

## 2013-06-18 MED ORDER — PROMETHAZINE HCL 25 MG/ML IJ SOLN: 6.2500 mg | INTRAMUSCULAR | Status: DC | PRN

## 2013-06-18 MED ORDER — ARTIFICIAL TEARS OP OINT
TOPICAL_OINTMENT | Freq: Every evening | OPHTHALMIC | Status: DC | PRN
  Administered 2013-06-18: 1 via OPHTHALMIC
  Filled 2013-06-18: qty 3.5

## 2013-06-18 MED ORDER — INSULIN ASPART 100 UNIT/ML ~~LOC~~ SOLN
0.0000 [IU] | SUBCUTANEOUS | Status: DC
  Administered 2013-06-18: 3 [IU] via SUBCUTANEOUS
  Administered 2013-06-18 (×2): 5 [IU] via SUBCUTANEOUS
  Administered 2013-06-19: 3 [IU] via SUBCUTANEOUS
  Administered 2013-06-19: 2 [IU] via SUBCUTANEOUS
  Administered 2013-06-19: 3 [IU] via SUBCUTANEOUS
  Administered 2013-06-19: 2 [IU] via SUBCUTANEOUS
  Administered 2013-06-19: 3 [IU] via SUBCUTANEOUS
  Administered 2013-06-20 (×2): 2 [IU] via SUBCUTANEOUS
  Administered 2013-06-20 (×2): 3 [IU] via SUBCUTANEOUS
  Administered 2013-06-20: 2 [IU] via SUBCUTANEOUS

## 2013-06-18 MED ORDER — METOPROLOL TARTRATE 25 MG PO TABS
25.0000 mg | ORAL_TABLET | Freq: Once | ORAL | Status: AC
  Administered 2013-06-18: 25 mg via ORAL
  Filled 2013-06-18: qty 1

## 2013-06-18 MED ORDER — DOXAZOSIN MESYLATE 8 MG PO TABS
8.0000 mg | ORAL_TABLET | Freq: Every day | ORAL | Status: DC
  Administered 2013-06-18 – 2013-06-19 (×2): 8 mg via ORAL
  Filled 2013-06-18 (×3): qty 1

## 2013-06-18 MED ORDER — MANNITOL 25 % IV SOLN
INTRAVENOUS | Status: DC | PRN
  Administered 2013-06-18 (×2): 12.5 g via INTRAVENOUS

## 2013-06-18 MED ORDER — DIPHENHYDRAMINE HCL 50 MG/ML IJ SOLN: 12.5000 mg | Freq: Four times a day (QID) | INTRAMUSCULAR | Status: DC | PRN

## 2013-06-18 MED ORDER — PROPOFOL 10 MG/ML IV BOLUS
INTRAVENOUS | Status: DC | PRN
  Administered 2013-06-18: 270 mg via INTRAVENOUS

## 2013-06-18 MED ORDER — CEFAZOLIN SODIUM-DEXTROSE 2-3 GM-% IV SOLR
2.0000 g | INTRAVENOUS | Status: AC
  Administered 2013-06-18: 2 g via INTRAVENOUS

## 2013-06-18 MED ORDER — CEFAZOLIN SODIUM-DEXTROSE 2-3 GM-% IV SOLR
INTRAVENOUS | Status: AC
  Filled 2013-06-18: qty 50

## 2013-06-18 MED ORDER — ONDANSETRON HCL 4 MG/2ML IJ SOLN
4.0000 mg | INTRAMUSCULAR | Status: DC | PRN
  Administered 2013-06-18: 4 mg via INTRAVENOUS
  Filled 2013-06-18: qty 2

## 2013-06-18 MED ORDER — MORPHINE SULFATE 2 MG/ML IJ SOLN
2.0000 mg | INTRAMUSCULAR | Status: DC | PRN
  Administered 2013-06-18 – 2013-06-19 (×3): 2 mg via INTRAVENOUS
  Filled 2013-06-18 (×3): qty 1

## 2013-06-18 MED ORDER — LACTATED RINGERS IR SOLN
Status: DC | PRN
  Administered 2013-06-18: 100 mL

## 2013-06-18 MED ORDER — MIDAZOLAM HCL 5 MG/5ML IJ SOLN
INTRAMUSCULAR | Status: DC | PRN
  Administered 2013-06-18: 2 mg via INTRAVENOUS

## 2013-06-18 MED ORDER — DIPHENHYDRAMINE HCL 12.5 MG/5ML PO ELIX: 12.5000 mg | ORAL_SOLUTION | Freq: Four times a day (QID) | ORAL | Status: DC | PRN

## 2013-06-18 SURGICAL SUPPLY — 63 items
ADH SKN CLS APL DERMABOND .7 (GAUZE/BANDAGES/DRESSINGS) ×2
APL ESCP 34 STRL LF DISP (HEMOSTASIS)
APPLICATOR SURGIFLO ENDO (HEMOSTASIS) IMPLANT
BAG SPEC RTRVL LRG 6X4 10 (ENDOMECHANICALS) ×1
CHLORAPREP W/TINT 26ML (MISCELLANEOUS) ×2 IMPLANT
CLIP LIGATING HEM O LOK PURPLE (MISCELLANEOUS) ×1 IMPLANT
CLIP LIGATING HEMO O LOK GREEN (MISCELLANEOUS) ×2 IMPLANT
CLOTH BEACON ORANGE TIMEOUT ST (SAFETY) ×2 IMPLANT
CORDS BIPOLAR (ELECTRODE) ×2 IMPLANT
COVER SURGICAL LIGHT HANDLE (MISCELLANEOUS) ×2 IMPLANT
COVER TIP SHEARS 8 DVNC (MISCELLANEOUS) ×1 IMPLANT
COVER TIP SHEARS 8MM DA VINCI (MISCELLANEOUS) ×1
DECANTER SPIKE VIAL GLASS SM (MISCELLANEOUS) ×2 IMPLANT
DERMABOND ADVANCED (GAUZE/BANDAGES/DRESSINGS) ×2
DERMABOND ADVANCED .7 DNX12 (GAUZE/BANDAGES/DRESSINGS) ×2 IMPLANT
DRAIN CHANNEL 15F RND FF 3/16 (WOUND CARE) ×2 IMPLANT
DRAPE INCISE IOBAN 66X45 STRL (DRAPES) ×2 IMPLANT
DRAPE LAPAROSCOPIC ABDOMINAL (DRAPES) ×2 IMPLANT
DRAPE LG THREE QUARTER DISP (DRAPES) ×4 IMPLANT
DRAPE TABLE BACK 44X90 PK DISP (DRAPES) ×2 IMPLANT
DRAPE WARM FLUID 44X44 (DRAPE) ×2 IMPLANT
DRESSING SURGICEL FIBRLLR 1X2 (HEMOSTASIS) ×1 IMPLANT
DRSG SURGICEL FIBRILLAR 1X2 (HEMOSTASIS) ×2
DRSG TEGADERM 4X4.75 (GAUZE/BANDAGES/DRESSINGS) ×1 IMPLANT
ELECT REM PT RETURN 9FT ADLT (ELECTROSURGICAL) ×4
ELECTRODE REM PT RTRN 9FT ADLT (ELECTROSURGICAL) ×2 IMPLANT
EVACUATOR SILICONE 100CC (DRAIN) ×2 IMPLANT
GAUZE VASELINE 3X9 (GAUZE/BANDAGES/DRESSINGS) IMPLANT
GLOVE BIO SURGEON STRL SZ 6.5 (GLOVE) ×2 IMPLANT
GLOVE BIOGEL M STRL SZ7.5 (GLOVE) ×5 IMPLANT
GOWN STRL NON-REIN LRG LVL3 (GOWN DISPOSABLE) ×2 IMPLANT
GOWN STRL REIN XL XLG (GOWN DISPOSABLE) ×1 IMPLANT
HEMOSTAT SURGICEL 4X8 (HEMOSTASIS) IMPLANT
KIT ACCESSORY DA VINCI DISP (KITS) ×1
KIT ACCESSORY DVNC DISP (KITS) ×1 IMPLANT
KIT BASIN OR (CUSTOM PROCEDURE TRAY) ×2 IMPLANT
PENCIL BUTTON HOLSTER BLD 10FT (ELECTRODE) ×2 IMPLANT
POSITIONER SURGICAL ARM (MISCELLANEOUS) ×4 IMPLANT
POUCH SPECIMEN RETRIEVAL 10MM (ENDOMECHANICALS) ×2 IMPLANT
SET TUBE IRRIG SUCTION NO TIP (IRRIGATION / IRRIGATOR) ×1 IMPLANT
SOLUTION ANTI FOG 6CC (MISCELLANEOUS) ×2 IMPLANT
SOLUTION ELECTROLUBE (MISCELLANEOUS) ×2 IMPLANT
SPONGE LAP 18X18 X RAY DECT (DISPOSABLE) ×1 IMPLANT
SURGIFLO W/THROMBIN 8M KIT (HEMOSTASIS) ×2 IMPLANT
SUT ETHILON 3 0 PS 1 (SUTURE) ×2 IMPLANT
SUT MNCRL AB 4-0 PS2 18 (SUTURE) ×4 IMPLANT
SUT V-LOC BARB 180 2/0GR6 GS22 (SUTURE)
SUT V-LOC BARB 180 2/0GR9 GS23 (SUTURE) ×2
SUT VIC AB 0 CT1 27 (SUTURE) ×2
SUT VIC AB 0 CT1 27XBRD ANTBC (SUTURE) ×1 IMPLANT
SUT VICRYL 0 UR6 27IN ABS (SUTURE) ×5 IMPLANT
SUT VLOC BARB 180 ABS3/0GR12 (SUTURE)
SUTURE V-LC BRB 180 2/0GR6GS22 (SUTURE) IMPLANT
SUTURE V-LC BRB 180 2/0GR9GS23 (SUTURE) IMPLANT
SUTURE VLOC BRB 180 ABS3/0GR12 (SUTURE) ×1 IMPLANT
TOWEL OR NON WOVEN STRL DISP B (DISPOSABLE) ×2 IMPLANT
TRAY FOLEY CATH 14FRSI W/METER (CATHETERS) ×1 IMPLANT
TRAY FOLEY CATH 16FRSI W/METER (SET/KITS/TRAYS/PACK) ×1 IMPLANT
TRAY LAP CHOLE (CUSTOM PROCEDURE TRAY) ×2 IMPLANT
TROCAR ENDOPATH XCEL 12X100 BL (ENDOMECHANICALS) ×2 IMPLANT
TROCAR XCEL 12X100 BLDLESS (ENDOMECHANICALS) ×2 IMPLANT
TUBING INSUFFLATION 10FT LAP (TUBING) ×2 IMPLANT
WATER STERILE IRR 1500ML POUR (IV SOLUTION) ×4 IMPLANT

## 2013-06-18 NOTE — Op Note (Signed)
Preoperative diagnosis: Left renal neoplasm  Postoperative diagnosis: Left renal neoplasm  Procedure:  1. Left robotic-assisted laparoscopic partial nephrectomy 2. Intraoperative renal ultrasonography  Surgeon: [name] M.D.  Assistant(s): Pecola Leisure, PA-C  Anesthesia: General  Complications: None  EBL: 125 mL  IVF:  2100 mL crystalloid  Specimens: 1. Left renal neoplasm  Disposition of specimens: Pathology  Intraoperative findings:       1. Warm renal ischemia time: 17 minutes       2. Intraoperative renal ultrasound findings: Intraoperative renal ultrasound demonstrated a solid 1.7 cm renal mass off the anterior aspect of the left kidney consistent with his preoperative imaging.  Drains: 1. # 15 Blake perinephric drain  Indication:  Matthew Schmitt is a 62 y.o. year old patient with a left renal neoplasm.  After a thorough review of the management options for their renal mass, they elected to proceed with surgical treatment and the above procedure.  We have discussed the potential benefits and risks of the procedure, side effects of the proposed treatment, the likelihood of the patient achieving the goals of the procedure, and any potential problems that might occur during the procedure or recuperation. Informed consent has been obtained.   Description of procedure:  The patient was taken to the operating room and a general anesthetic was administered. The patient was given preoperative antibiotics, placed in the left modified flank position with care to pad all potential pressure points, and prepped and draped in the usual sterile fashion. Next a preoperative timeout was performed.  A site was selected on the left side of the umbilicus for placement of the camera port. This was placed using a standard open Hassan technique which allowed entry into the peritoneal cavity under direct vision and without difficulty. A 12 mm port was placed and a  pneumoperitoneum established. The camera was then used to inspect the abdomen and there was no evidence of any intra-abdominal injuries or other abnormalities. The remaining abdominal ports were then placed. 8 mm robotic ports were placed in the left upper quadrant, left lower quadrant, and far left lateral abdominal wall. A 12 mm port was placed in the upper midline for laparoscopic assistance. All ports were placed under direct vision without difficulty. The surgical cart was then docked.   Utilizing the cautery scissors, the white line of Toldt was incised allowing the colon to be mobilized medially and the plane between the mesocolon and the anterior layer of Gerota's fascia to be developed and the kidney to be exposed.  The ureter and gonadal vein were identified inferiorly and the ureter was lifted anteriorly off the psoas muscle.  Dissection proceeded superiorly along the gonadal vein until the renal vein was identified.  The renal hilum was then carefully isolated with a combination of blunt and sharp dissection allowing the renal arterial and venous structures to be separated and isolated in preparation for renal hilar vessel clamping. There was a single branching renal artery and a single renal vein.  12.5 g of IV mannitol was then administered.   Attention turned to the kidney and the perinephric fat surrounding the renal mass was removed and the kidney was mobilized sufficiently for exposure and resection of the renal mass.   Intraoperative renal ultrasonography was utilized with the laparoscopic ultrasound probe to identify the renal tumor and identify the tumor margins. Findings demonstrated a 1.7 cm solid mass consistent with a possible malignancy lesion.  Once the renal mass was properly isolated, preparations were made for  resection of the tumor.  Reconstructive sutures were placed into the abdomen for the renorrhaphy portion of the procedure.  The renal artery was then clamped with bulldog  clamps.  The tumor was then excised with cold scissor dissection along with an adequate visible gross margin of normal renal parenchyma. The tumor appeared to be excised without any gross violation of the tumor. The renal collecting system was entered during removal of the tumor.  A running 3-0 V-lock suture was then brought through the capsule of the kidney and run along the base of the renal defect to provide hemostasis and close any entry into the renal collecting system if present. Weck clips were used to secure this suture outside the renal capsule at the proximal and distal ends. An additional hemostatic agent (Surgiflo) was then placed into the renal defect. A running 2-0 V lock suture was then used to close the renal capsule using a sliding clip technique which resulted in excellent compression of the renal defect.    The bulldog clamps were then removed from the renal hilar vessel(s) and an additional 12.5 g of IV mannitol was administered. Total warm renal ischemia time was 17 minutes. The renal tumor resection site was examined. Hemostasis appeared adequate.   The kidney was placed back into its normal anatomic position and covered with perinephric fat as needed.  A # 15 Blake drain was then brought through the lateral lower port site and positioned in the perinephric space.  It was secured to the skin with a nylon suture. The surgical cart was undocked.  The renal tumor specimen was removed intact within an endopouch retrieval bag via the camera port sites.  The camera port site and the other 12 mm port site were then closed at the fascial level with 0-vicryl suture.  All other laparoscopic/robotic ports were removed under direct vision and the pneumoperitoneum let down with inspection of the operative field performed and hemostasis again confirmed. All incision sites were then injected with local anesthetic and reapproximated at the skin level with 4-0 monocryl subcuticular closures.  Dermabond was  applied to the skin.  The patient tolerated the procedure well and without complications.  The patient was able to be extubated and transferred to the recovery unit in satisfactory condition.  Moody Bruins MD

## 2013-06-18 NOTE — Transfer of Care (Signed)
Immediate Anesthesia Transfer of Care Note  Patient: Matthew Schmitt  Procedure(s) Performed: Procedure(s): ROBOTIC ASSISTED PARTIAL NEPHRECTOMY (Left)  Patient Location: PACU  Anesthesia Type:General  Level of Consciousness: awake, alert , oriented and patient cooperative  Airway & Oxygen Therapy: Patient Spontanous Breathing and Patient connected to face mask oxygen  Post-op Assessment: Report given to PACU RN, Post -op Vital signs reviewed and stable and Patient moving all extremities  Post vital signs: Reviewed and stable  Complications: No apparent anesthesia complications 

## 2013-06-18 NOTE — Interval H&P Note (Signed)
History and Physical Interval Note:  06/18/2013 7:04 AM  Matthew Schmitt  has presented today for surgery, with the diagnosis of LEFT RENAL NEOPLASM  The various methods of treatment have been discussed with the patient and family. After consideration of risks, benefits and other options for treatment, the patient has consented to  Procedure(s): ROBOTIC ASSISTED PARTIAL NEPHRECTOMY (Left) as a surgical intervention .  The patient's history has been reviewed, patient examined, no change in status, stable for surgery.  I have reviewed the patient's chart and labs.  Questions were answered to the patient's satisfaction.     Valerya Maxton,LES

## 2013-06-18 NOTE — Anesthesia Preprocedure Evaluation (Addendum)
Anesthesia Evaluation  Patient identified by MRN, date of birth, ID band Patient awake    Reviewed: Allergy & Precautions, H&P , NPO status , Patient's Chart, lab work & pertinent test results  Airway Mallampati: II TM Distance: >3 FB Neck ROM: Full    Dental no notable dental hx.    Pulmonary neg pulmonary ROS,  breath sounds clear to auscultation  Pulmonary exam normal       Cardiovascular hypertension, Pt. on medications + angina with exertion + CAD + dysrhythmias Atrial Fibrillation + Valvular Problems/Murmurs (moderate AI) AI Rhythm:Regular Rate:Normal  rbbb   Neuro/Psych negative neurological ROS  negative psych ROS   GI/Hepatic negative GI ROS, Neg liver ROS,   Endo/Other  diabetes, Type 2, Oral Hypoglycemic Agents  Renal/GU negative Renal ROS  negative genitourinary   Musculoskeletal negative musculoskeletal ROS (+)   Abdominal   Peds negative pediatric ROS (+)  Hematology negative hematology ROS (+)   Anesthesia Other Findings   Reproductive/Obstetrics negative OB ROS                          Anesthesia Physical Anesthesia Plan  ASA: III  Anesthesia Plan: General   Post-op Pain Management:    Induction: Intravenous  Airway Management Planned: Oral ETT  Additional Equipment: Arterial line  Intra-op Plan:   Post-operative Plan: Extubation in OR  Informed Consent: I have reviewed the patients History and Physical, chart, labs and discussed the procedure including the risks, benefits and alternatives for the proposed anesthesia with the patient or authorized representative who has indicated his/her understanding and acceptance.   Dental advisory given  Plan Discussed with: CRNA  Anesthesia Plan Comments:         Anesthesia Quick Evaluation

## 2013-06-18 NOTE — Progress Notes (Signed)
Hgb. And Hct. And B Met drawn by lab. 

## 2013-06-18 NOTE — Anesthesia Postprocedure Evaluation (Signed)
  Anesthesia Post-op Note  Patient: Matthew Schmitt  Procedure(s) Performed: Procedure(s) (LRB): ROBOTIC ASSISTED PARTIAL NEPHRECTOMY (Left)  Patient Location: PACU  Anesthesia Type: General  Level of Consciousness: awake and alert   Airway and Oxygen Therapy: Patient Spontanous Breathing  Post-op Pain: mild  Post-op Assessment: Post-op Vital signs reviewed, Patient's Cardiovascular Status Stable, Respiratory Function Stable, Patent Airway and No signs of Nausea or vomiting. No CP or SOB  Last Vitals:  Filed Vitals:   06/18/13 1200  BP: 159/74  Pulse: 69  Temp:   Resp: 14    Post-op Vital Signs: stable.    Complications: Pt c/o R sided eye discomfort. Feels like a foreign object in eye. Upon exam eye is injected but no foreign body. Suspect corneal abrasion. Will patch eye overnight. Pt to call anesthesia if still bothersome tomorrow. He understands and agrees.

## 2013-06-18 NOTE — Anesthesia Procedure Notes (Signed)
Anesthesia Procedure Note     

## 2013-06-18 NOTE — Progress Notes (Signed)
Dr. Laverle Patter made aware of patient's EKG readings.

## 2013-06-18 NOTE — Preoperative (Signed)
Beta Blockers   Reason not to administer Beta Blockers:Metotpolol taken at 0630 06-18-13

## 2013-06-18 NOTE — Progress Notes (Signed)
Dr. [name] Lav made aware of patient's EKG  Readings- orders given.

## 2013-06-18 NOTE — Progress Notes (Signed)
Dr. Laverle Patter and Dr. [name] Lav in to see patient and 12 Lead EKG COPY - O.K. To go to Telemetry

## 2013-06-18 NOTE — Transfer of Care (Signed)
Immediate Anesthesia Transfer of Care Note  Patient: Matthew Schmitt  Procedure(s) Performed: Procedure(s): ROBOTIC ASSISTED PARTIAL NEPHRECTOMY (Left)  Patient Location: PACU  Anesthesia Type:General  Level of Consciousness: awake, alert , oriented and patient cooperative  Airway & Oxygen Therapy: Patient Spontanous Breathing and Patient connected to face mask oxygen  Post-op Assessment: Report given to PACU RN, Post -op Vital signs reviewed and stable and Patient moving all extremities  Post vital signs: Reviewed and stable  Complications: No apparent anesthesia complications

## 2013-06-18 NOTE — Progress Notes (Signed)
Lab work noted- B MET AND HGB. AND HCT.

## 2013-06-18 NOTE — Progress Notes (Signed)
Patient ID: Matthew Schmitt, male   DOB: 09/07/1951, 62 y.o.   MRN: [id number]  Post-op note  Subjective: The patient is doing well except does still have some left eye pain.  It is improved significantly from earlier this afternoon.   Objective: Vital signs in last 24 hours: Temp:  [97.7 F (36.5 C)-98.7 F (37.1 C)] 98.3 F (36.8 C) (07/31 1233) Pulse Rate:  [61-75] 68 (07/31 1233) Resp:  [13-20] 14 (07/31 1233) BP: (150-178)/(69-85) 150/69 mmHg (07/31 1233) SpO2:  [96 %-100 %] 97 % (07/31 1233) Arterial Line BP: (160-190)/(66-87) 166/67 mmHg (07/31 1130) Weight:  [95.255 kg (210 lb)] 95.255 kg (210 lb) (07/31 1500)  Intake/Output from previous day:   Intake/Output this shift: Total I/O In: 3222.5 [I.V.:3222.5] Out: 1760 [Urine:1525; Drains:110; Blood:125]  Physical Exam:  General: Alert and oriented. Abdomen: Soft, Nondistended. Incisions: Clean and dry.  Lab Results:  Recent Labs  06/18/13 1052  HGB 13.5  HCT 39.9    Assessment/Plan: POD#0   1) Will have him use artificial tears prn tonight and if still bothersome in morning, will see if Opthalmology can see in consultation for possible corneal abrasion. 2) Monitor renal function and HD parameters tonight.   Rolly Salter, Montez Hageman. MD   LOS: 0 days   Hiba Garry,LES 06/18/2013, 6:04 PM

## 2013-06-18 NOTE — Progress Notes (Signed)
Dr. Laverle Patter notified about patient's left eye condition and that Dr. [name] Lav suggested that Dr. Laverle Patter may want to call an opthomologist in to see patient- Dr. Laverle Patter will follow up on

## 2013-06-18 NOTE — Progress Notes (Signed)
12 LEAD EKG done. 

## 2013-06-19 ENCOUNTER — Encounter (HOSPITAL_COMMUNITY): Payer: Self-pay | Admitting: Urology

## 2013-06-19 LAB — BASIC METABOLIC PANEL
BUN: 13 mg/dL (ref 6–23)
CO2: 28 mEq/L (ref 19–32)
Calcium: 8.6 mg/dL (ref 8.4–10.5)
Chloride: 103 mEq/L (ref 96–112)
Creatinine, Ser: 0.92 mg/dL (ref 0.50–1.35)
GFR calc Af Amer: >90 mL/min (ref >90)
GFR calc non Af Amer: 89 mL/min — ABNORMAL LOW (ref >90)
Glucose, Bld: 180 mg/dL — ABNORMAL HIGH (ref 70–99)
Potassium: 3.6 mEq/L (ref 3.5–5.1)
Sodium: 137 mEq/L (ref 135–145)

## 2013-06-19 LAB — CREATININE, FLUID (PLEURAL, PERITONEAL, JP DRAINAGE): Creat, Fluid: 0.9 mg/dL

## 2013-06-19 LAB — GLUCOSE, CAPILLARY
Glucose-Capillary: 186 mg/dL — ABNORMAL HIGH (ref 70–99)
Glucose-Capillary: 160 mg/dL — ABNORMAL HIGH (ref 70–99)
Glucose-Capillary: 135 mg/dL — ABNORMAL HIGH (ref 70–99)
Glucose-Capillary: 155 mg/dL — ABNORMAL HIGH (ref 70–99)
Glucose-Capillary: 153 mg/dL — ABNORMAL HIGH (ref 70–99)

## 2013-06-19 LAB — HEMOGLOBIN AND HEMATOCRIT, BLOOD
HCT: 40 % (ref 39.0–52.0)
Hemoglobin: 13.4 g/dL (ref 13.0–17.0)

## 2013-06-19 MED ORDER — BISACODYL 10 MG RE SUPP
10.0000 mg | Freq: Once | RECTAL | Status: AC
  Administered 2013-06-19: 10 mg via RECTAL
  Filled 2013-06-19: qty 1

## 2013-06-19 MED ORDER — DSS 100 MG PO CAPS: 100.0000 mg | ORAL_CAPSULE | Freq: Two times a day (BID) | ORAL | Status: DC

## 2013-06-19 MED ORDER — HYDROCODONE-ACETAMINOPHEN 5-325 MG PO TABS
1.0000 | ORAL_TABLET | Freq: Four times a day (QID) | ORAL | Status: DC | PRN
  Administered 2013-06-19 (×2): 2 via ORAL
  Filled 2013-06-19 (×2): qty 2

## 2013-06-19 MED ORDER — HYDROCODONE-ACETAMINOPHEN 5-325 MG PO TABS: 1.0000 | ORAL_TABLET | Freq: Four times a day (QID) | ORAL | Status: DC | PRN

## 2013-06-19 MED ORDER — BISACODYL 10 MG RE SUPP
10.0000 mg | Freq: Once | RECTAL | Status: AC
  Administered 2013-06-19: 10 mg via RECTAL
  Filled 2013-06-19 (×2): qty 1

## 2013-06-19 MED ORDER — ARTIFICIAL TEARS OP OINT
TOPICAL_OINTMENT | Freq: Once | OPHTHALMIC | Status: AC
  Administered 2013-06-19: 09:00:00 via OPHTHALMIC
  Filled 2013-06-19: qty 3.5

## 2013-06-19 MED ORDER — SODIUM CHLORIDE 0.45 % IV SOLN
INTRAVENOUS | Status: DC
  Administered 2013-06-19: 1000 mL via INTRAVENOUS

## 2013-06-19 NOTE — Progress Notes (Signed)
Emptied JP drain and a small clot came out with the fluid contents, but one larger clot stayed in the drain and was unable to remove.  Will continue to monitor and will pass information to day shift nurse.  Ernesta Amble, RN

## 2013-06-19 NOTE — Progress Notes (Signed)
Patient ID: Matthew Schmitt, male   DOB: [birth year], 62 y.o.   MRN: [id number]  1 Day Post-Op Subjective: The patient is doing well.  No nausea or vomiting. Pain is adequately controlled. Left eye pain significantly improved. No flatus.  Objective: Vital signs in last 24 hours: Temp:  [97.7 F (36.5 C)-98.3 F (36.8 C)] 97.7 F (36.5 C) (08/01 0556) Pulse Rate:  [54-75] 54 (08/01 0556) Resp:  [13-20] 16 (08/01 0556) BP: (117-178)/(58-85) 117/58 mmHg (08/01 0556) SpO2:  [96 %-100 %] 98 % (08/01 0556) Arterial Line BP: (160-190)/(66-87) 166/67 mmHg (07/31 1130) Weight:  [95.255 kg (210 lb)] 95.255 kg (210 lb) (07/31 1500)  Intake/Output from previous day: 07/31 0701 - 08/01 0700 In: 5177.5 [I.V.:5077.5; IV Piggyback:100] Out: 3120 [Urine:2825; Drains:170; Blood:125] Intake/Output this shift:    Physical Exam:  General: Alert and oriented. CV: RRR Lungs: Clear bilaterally. GI: Soft, Nondistended. Positive BS Incisions: Clean and dry. Urine: Light pink Extremities: Nontender, no erythema, no edema.  Lab Results:  Recent Labs  06/18/13 1052 06/19/13 0410  HGB 13.5 13.4  HCT 39.9 40.0          Recent Labs  06/12/13 0835 06/18/13 1052 06/19/13 0410  CREATININE 0.93 0.84 0.92           Results for orders placed during the hospital encounter of 06/18/13 (from the past 24 hour(s))  GLUCOSE, CAPILLARY     Status: Abnormal   Collection Time    06/18/13 10:35 AM      Result Value Range   Glucose-Capillary 199 (*) 70 - 99 mg/dL   Comment 1 Documented in Chart     Comment 2 Notify RN    BASIC METABOLIC PANEL     Status: Abnormal   Collection Time    06/18/13 10:52 AM      Result Value Range   Sodium 138  135 - 145 mEq/L   Potassium 3.8  3.5 - 5.1 mEq/L   Chloride 103  96 - 112 mEq/L   CO2 25  19 - 32 mEq/L   Glucose, Bld 217 (*) 70 - 99 mg/dL   BUN 13  6 - 23 mg/dL   Creatinine, Ser 4.09  0.50 - 1.35 mg/dL   Calcium 8.2 (*) 8.4 - 10.5 mg/dL   GFR calc non Af  Amer >90  >90 mL/min   GFR calc Af Amer >90  >90 mL/min  HEMOGLOBIN AND HEMATOCRIT, BLOOD     Status: None   Collection Time    06/18/13 10:52 AM      Result Value Range   Hemoglobin 13.5  13.0 - 17.0 g/dL   HCT 81.1  91.4 - 78.2 %  GLUCOSE, CAPILLARY     Status: Abnormal   Collection Time    06/18/13 12:47 PM      Result Value Range   Glucose-Capillary 206 (*) 70 - 99 mg/dL   Comment 1 Notify RN     Comment 2 Documented in Chart    GLUCOSE, CAPILLARY     Status: Abnormal   Collection Time    06/18/13  4:53 PM      Result Value Range   Glucose-Capillary 207 (*) 70 - 99 mg/dL  GLUCOSE, CAPILLARY     Status: Abnormal   Collection Time    06/18/13  8:09 PM      Result Value Range   Glucose-Capillary 244 (*) 70 - 99 mg/dL   Comment 1 Notify RN    GLUCOSE, CAPILLARY  Status: Abnormal   Collection Time    06/18/13 11:52 PM      Result Value Range   Glucose-Capillary 179 (*) 70 - 99 mg/dL  GLUCOSE, CAPILLARY     Status: Abnormal   Collection Time    06/19/13  3:26 AM      Result Value Range   Glucose-Capillary 186 (*) 70 - 99 mg/dL  BASIC METABOLIC PANEL     Status: Abnormal   Collection Time    06/19/13  4:10 AM      Result Value Range   Sodium 137  135 - 145 mEq/L   Potassium 3.6  3.5 - 5.1 mEq/L   Chloride 103  96 - 112 mEq/L   CO2 28  19 - 32 mEq/L   Glucose, Bld 180 (*) 70 - 99 mg/dL   BUN 13  6 - 23 mg/dL   Creatinine, Ser 7.82  0.50 - 1.35 mg/dL   Calcium 8.6  8.4 - 95.6 mg/dL   GFR calc non Af Amer 89 (*) >90 mL/min   GFR calc Af Amer >90  >90 mL/min  HEMOGLOBIN AND HEMATOCRIT, BLOOD     Status: None   Collection Time    06/19/13  4:10 AM      Result Value Range   Hemoglobin 13.4  13.0 - 17.0 g/dL   HCT 21.3  08.6 - 57.8 %    Assessment/Plan: POD# 1 s/p robotic partial nephrectomy.  1) Ambulate, Incentive spirometry 2) Advance diet as tolerated 3) Transition to oral pain medication 4) Dulcolax suppository 5) D/C urethral catheter 6) Artificial  tears this morning. Do not think he will require an Opthalmology consult as pain is significantly improved.   Rolly Salter, Montez Hageman. MD   LOS: 1 day   Mose Colaizzi,LES 06/19/2013, 7:25 AM

## 2013-06-19 NOTE — Progress Notes (Signed)
Patient ID: Matthew Schmitt, male   DOB: Jul 24, 1951, 62 y.o.   MRN: [id number]  Pt still doing well.  Eye discomfort has resolved. Still no flatus.   AFVSS  Abdomen mildly distended, minimal BS Inc C/D/I  Pathology: Oncocytoma - reviewed with patient and his wife tonight.  Plan: -Dulcolax suppository - Continue ambulating - Check drain creatinine - SL IVF - D/C in AM if doing well - Patient has been instructed to restart anticoagulation next Wednesday PM

## 2013-06-19 NOTE — Progress Notes (Signed)
Inpatient Diabetes Program Recommendations  AACE/ADA: New Consensus Statement on Inpatient Glycemic Control (2013)  Target Ranges:  Prepandial:   less than 140 mg/dL      Peak postprandial:   less than 180 mg/dL (1-2 hours)      Critically ill patients:  140 - 180 mg/dL   Inpatient Diabetes Program Recommendations Correction (SSI): change Novolog correction scale to TID + HS scale once eating Diet: add carbohydrate modified to diet Thank you  Piedad Climes BSN, RN,CDE Inpatient Diabetes Coordinator [phone] (team pager)

## 2013-06-20 LAB — GLUCOSE, CAPILLARY
Glucose-Capillary: 132 mg/dL — ABNORMAL HIGH (ref 70–99)
Glucose-Capillary: 127 mg/dL — ABNORMAL HIGH (ref 70–99)
Glucose-Capillary: 149 mg/dL — ABNORMAL HIGH (ref 70–99)
Glucose-Capillary: 175 mg/dL — ABNORMAL HIGH (ref 70–99)
Glucose-Capillary: 181 mg/dL — ABNORMAL HIGH (ref 70–99)

## 2013-06-20 LAB — BASIC METABOLIC PANEL
BUN: 16 mg/dL (ref 6–23)
CO2: 29 mEq/L (ref 19–32)
Calcium: 8.5 mg/dL (ref 8.4–10.5)
Chloride: 103 mEq/L (ref 96–112)
Creatinine, Ser: 1.02 mg/dL (ref 0.50–1.35)
GFR calc Af Amer: 89 mL/min — ABNORMAL LOW (ref >90)
GFR calc non Af Amer: 77 mL/min — ABNORMAL LOW (ref >90)
Glucose, Bld: 143 mg/dL — ABNORMAL HIGH (ref 70–99)
Potassium: 3.8 mEq/L (ref 3.5–5.1)
Sodium: 137 mEq/L (ref 135–145)

## 2013-06-20 LAB — HEMOGLOBIN AND HEMATOCRIT, BLOOD
HCT: 38.6 % — ABNORMAL LOW (ref 39.0–52.0)
Hemoglobin: 12.7 g/dL — ABNORMAL LOW (ref 13.0–17.0)

## 2013-06-20 MED ORDER — BISACODYL 10 MG RE SUPP
10.0000 mg | Freq: Once | RECTAL | Status: AC
  Administered 2013-06-20: 10 mg via RECTAL
  Filled 2013-06-20: qty 1

## 2013-06-20 MED ORDER — POLYETHYLENE GLYCOL 3350 17 G PO PACK
17.0000 g | PACK | Freq: Every day | ORAL | Status: DC
  Administered 2013-06-20: 17 g via ORAL
  Filled 2013-06-20: qty 1

## 2013-06-20 MED ORDER — MAGNESIUM HYDROXIDE 400 MG/5ML PO SUSP: 30.0000 mL | Freq: Every day | ORAL | Status: DC | PRN

## 2013-06-20 MED ORDER — POLYETHYLENE GLYCOL 3350 17 G PO PACK
17.0000 g | PACK | Freq: Every day | ORAL | Status: DC | PRN
  Filled 2013-06-20: qty 1

## 2013-06-20 MED ORDER — BISACODYL 5 MG PO TBEC: 10.0000 mg | DELAYED_RELEASE_TABLET | Freq: Every day | ORAL | Status: DC | PRN

## 2013-06-20 MED ORDER — MAGNESIUM HYDROXIDE 400 MG/5ML PO SUSP
30.0000 mL | Freq: Once | ORAL | Status: AC
  Administered 2013-06-20: 30 mL via ORAL
  Filled 2013-06-20: qty 30

## 2013-06-20 NOTE — Discharge Summary (Signed)
Physician Discharge Summary  Patient ID: Matthew Schmitt MRN: [id number] DOB/AGE: [birth year] 62 y.o.  Admit date: 06/18/2013 Discharge date: 06/20/2013  Admission Diagnoses: Left renal neoplasm  Discharge Diagnoses: Same as above  Discharged Condition: good  Hospital Course: He was admitted for elective left partial nephrectomy and underwent surgery without complication. He had an uneventful recovery other than a slight prolongation until bowel activity occurred. This was achieved with laxatives and ambulation. His drain fluid was found to have a creatinine similar to serum and therefore his drain was removed. His bowels eventually moved and he was discharged home.  Consults: None  Treatments: surgery: Left partial nephrectomy  Discharge Exam: Blood pressure 147/85, pulse 85, temperature 99 F (37.2 C), temperature source Oral, resp. rate 18, height 6\' 1"  (1.854 m), weight 95.255 kg (210 lb), SpO2 97.00%. See progress note done earlier today.  Disposition: 01-Home or Self Care  Discharge Orders   Future Appointments Provider Department Dept Phone   07/24/2013 7:45 AM Romero Belling, MD [facility] [phone]   Future Orders Complete By Expires     Discharge patient  As directed     Discontinue IV  As directed         Medication List    STOP taking these medications       Rivaroxaban 20 MG Tabs  Commonly known as:  XARELTO      TAKE these medications       acetaminophen 500 MG tablet  Commonly known as:  TYLENOL  Take 500 mg by mouth every 6 (six) hours as needed for pain.     bromocriptine 2.5 MG tablet  Commonly known as:  PARLODEL  Take 2.5 mg by mouth daily.     doxazosin 8 MG tablet  Commonly known as:  CARDURA  Take 8 mg by mouth at bedtime.     DSS 100 MG Caps  Take 100 mg by mouth 2 (two) times daily.     HYDROcodone-acetaminophen 5-325 MG per tablet  Commonly known as:  NORCO/VICODIN  Take 1-2 tablets by mouth every 6 (six) hours  as needed.     Liraglutide 18 MG/3ML Soln injection  Commonly known as:  VICTOZA  Inject 0.3 mLs (1.8 mg total) into the skin every evening.     metFORMIN 500 MG 24 hr tablet  Commonly known as:  GLUCOPHAGE-XR  Take 2,000 mg by mouth daily with breakfast. 4 tabs each morning     metoprolol tartrate 25 MG tablet  Commonly known as:  LOPRESSOR  Take 25 mg by mouth every morning.     potassium citrate 10 MEQ (1080 MG) SR tablet  Commonly known as:  UROCIT-K  Take 20 mEq by mouth daily.     pravastatin 40 MG tablet  Commonly known as:  PRAVACHOL  Take 40 mg by mouth every evening.     spironolactone 25 MG tablet  Commonly known as:  ALDACTONE  Take 25 mg by mouth daily as needed.     tamsulosin 0.4 MG Caps  Commonly known as:  FLOMAX  Take 0.4 mg by mouth 2 (two) times a week. Sundays and Thursday mornings     valsartan-hydrochlorothiazide 160-25 MG per tablet  Commonly known as:  DIOVAN-HCT  Take 1 tablet by mouth at bedtime.           Follow-up Information   Follow up with BORDEN,LES, MD. (The office will call you to schedule for a few weeks from now.)    Contact information:  [address] [place] [address] [phone]       Signed: Garnett Farm 06/20/2013, 6:51 PM

## 2013-06-20 NOTE — Progress Notes (Signed)
2 Days Post-Op Subjective: Patient reports Feeling well overall but has not had a bowel movement yet. He did receive a Dulcolax suppository yesterday without results. His abdomen was somewhat distended yesterday and he feels it is less so today. He does report some abdominal tenderness but this tenderness has remained stable and has not increased. He is passing gas.  Objective: Vital signs in last 24 hours: Temp:  [97.9 F (36.6 C)-98.3 F (36.8 C)] 98.3 F (36.8 C) (08/02 0629) Pulse Rate:  [68-78] 72 (08/02 0629) Resp:  [17-18] 18 (08/02 0629) BP: (129-175)/(68-90) 175/78 mmHg (08/02 0629) SpO2:  [97 %-100 %] 97 % (08/02 0629)  Intake/Output from previous day: 08/01 0701 - 08/02 0700 In: 1661.3 [P.O.:900; I.V.:761.3] Out: 2860 [Urine:2725; Drains:135] Intake/Output this shift: Total I/O In: 150 [P.O.:150] Out: 250 [Urine:200; Drains:50]  Physical Exam:  He is awake, alert and oriented. His abdomen reveals positive bowel sound. It seems mildly distended but is soft without peritoneal signs.  Lab Results:  Recent Labs  06/18/13 1052 06/19/13 0410 06/20/13 0519  HGB 13.5 13.4 12.7*  HCT 39.9 40.0 38.6*   BMET  Recent Labs  06/19/13 0410 06/20/13 0519  NA 137 137  K 3.6 3.8  CL 103 103  CO2 28 29  GLUCOSE 180* 143*  BUN 13 16  CREATININE 0.92 1.02  CALCIUM 8.6 8.5   No results found for this basename: LABPT, INR,  in the last 72 hours No results found for this basename: LABURIN,  in the last 72 hours Results for orders placed during the hospital encounter of 04/01/13  MRSA PCR SCREENING     Status: None   Collection Time    04/02/13  4:18 AM      Result Value Range Status   MRSA by PCR NEGATIVE  NEGATIVE Final   Comment:            The GeneXpert MRSA Assay (FDA     approved for NASAL specimens     only), is one component of a     comprehensive MRSA colonization     surveillance program. It is not     intended to diagnose MRSA     infection nor to  guide or     monitor treatment for     MRSA infections.    Studies/Results: No results found.  Assessment/Plan: He seems to doing pretty well overall and is tolerating  diet without nausea. He has been ambulating. He has passed gas but has not had a bowel movement.  His hemoglobin is down slightly although there is nothing to suggest active bleeding with stable vital signs including no tachycardia or hypotension. His drain output has decreased and the creatinine is consistent with serum.  Continue laxative use with another dose of Ducolax both orally and suppository.  I will leave his drain until tomorrow.  I have recommended he remain in the hospital one more day in order to get his bowels moving.  I will check a H&H in the morning.    LOS: 2 days   Matthew Schmitt C 06/20/2013, 12:26 PM

## 2013-06-20 NOTE — Progress Notes (Signed)
Pt had a BM. Paged Dr. Vernie Ammons to let him know. Pt very  about leaving.

## 2013-06-20 NOTE — Progress Notes (Signed)
Pt has been passing gas and walking the halls.

## 2013-07-24 ENCOUNTER — Ambulatory Visit: Payer: BC Managed Care – PPO | Admitting: Endocrinology

## 2013-07-27 ENCOUNTER — Ambulatory Visit (INDEPENDENT_AMBULATORY_CARE_PROVIDER_SITE_OTHER): Payer: BC Managed Care – PPO | Admitting: Endocrinology

## 2013-07-27 ENCOUNTER — Encounter: Payer: Self-pay | Admitting: Endocrinology

## 2013-07-27 VITALS — BP 126/72 | HR 78 | Ht 72.0 in | Wt 246.0 lb

## 2013-07-27 DIAGNOSIS — E119 Type 2 diabetes mellitus without complications: Secondary | ICD-10-CM

## 2013-07-27 DIAGNOSIS — Z79899 Other long term (current) drug therapy: Secondary | ICD-10-CM

## 2013-07-27 HISTORY — DX: Other long term (current) drug therapy: Z79.899

## 2013-07-27 LAB — HEPATIC FUNCTION PANEL
ALT: 40 U/L (ref 0–53)
AST: 36 U/L (ref 0–37)
Albumin: 4 g/dL (ref 3.5–5.2)
Alkaline Phosphatase: 46 U/L (ref 39–117)
Bilirubin, Direct: 0.1 mg/dL (ref 0.0–0.3)
Total Bilirubin: 1 mg/dL (ref 0.3–1.2)
Total Protein: 6.4 g/dL (ref 6.0–8.3)

## 2013-07-27 LAB — BASIC METABOLIC PANEL
BUN: 23 mg/dL (ref 6–23)
CO2: 29 mEq/L (ref 19–32)
Calcium: 9.2 mg/dL (ref 8.4–10.5)
Chloride: 105 mEq/L (ref 96–112)
Creatinine, Ser: 1 mg/dL (ref 0.4–1.5)
GFR: 80.24 mL/min (ref >60.00)
Glucose, Bld: 102 mg/dL — ABNORMAL HIGH (ref 70–99)
Potassium: 3.7 mEq/L (ref 3.5–5.1)
Sodium: 141 mEq/L (ref 135–145)

## 2013-07-27 LAB — HEMOGLOBIN A1C: Hgb A1c MFr Bld: 6.5 % (ref 4.6–6.5)

## 2013-07-27 NOTE — Progress Notes (Signed)
Subjective:    Patient ID: Matthew Schmitt, male    DOB: [birth year], 62 y.o.   MRN: [id number]  HPI Pt returns for f/u if type 2 DM (dx'ed 2002; he has mild if any neuropathy of the lower extremities; he has associated nephropathy, h/o leg ulcers, and CAD).  pt states he feels better since recent hospitalization.  He does not check cbg's.  Pt wants me to do a form about his health.   Past Medical History  Diagnosis Date  . Impotence of organic origin   . Mixed hyperlipidemia     slightly  . Essential hypertension, benign   . Type II or unspecified type diabetes mellitus without mention of complication, uncontrolled   . History of kidney stones   . Coronary artery disease   . Anginal pain   . RBBB   . Atrial fibrillation May 2014    "heart went to fast"  . GERD (gastroesophageal reflux disease)     occasional  . Arthritis     everywhere    Past Surgical History  Procedure Laterality Date  . Knee surgery  1978  &  1974    BILATERAL  . Cardiac catheterization  08-17-2011  DR SPENCER TILLEY    POSSIBLE MODERATE LAD STENOSIS JUST AFTER THE BIFURCATION OF LARGE DIAGONAL BRANCH/ LVEF  40-45%/ MILD GLOBAL HYPODINESIS (CATH DONE FOR ABNORMAL STRESS TEST OF FIXED LATERAL WALL DEFECT & BIFASCICULAR BLAOCK)  . Tonsillectomy  79  (age 62)  . Cystoscopy with retrograde pyelogram, ureteroscopy and stent placement  11/03/2012    Procedure: CYSTOSCOPY WITH RETROGRADE PYELOGRAM, URETEROSCOPY AND STENT PLACEMENT;  Surgeon: Valetta Fuller, MD;  [facility]  Service: Urology;  Laterality: Left;  Cystoscopy/Left Retrograde/Ureteroscopy/Holmium Laser Litho/Double J Stent  . Holmium laser application  11/03/2012    Procedure: HOLMIUM LASER APPLICATION;  Surgeon: Valetta Fuller, MD;  Location: [facility];  Service: Urology;  Laterality: Left;  . Cardioversion N/A 04/02/2013    Procedure: CARDIOVERSION;  Surgeon: Othella Boyer, MD;  Location: [facility] OR;  Service:  Cardiovascular;  Laterality: N/A;  . Pilonidal cyst excision    . Robotic assited partial nephrectomy Left 06/18/2013    Procedure: ROBOTIC ASSISTED PARTIAL NEPHRECTOMY;  Surgeon: [name], MD;  Location: [facility];  Service: Urology;  Laterality: Left;    History   Social History  . Marital Status: Married    Spouse Name: N/A    Number of Children: N/A  . Years of Education: 16   Occupational History  . Emergency planning/management officer, Mining engineer    Social History Main Topics  . Smoking status: Former Smoker -- 1.00 packs/day for 6 years    Types: Cigarettes    Quit date: 10/31/1969  . Smokeless tobacco: Never Used     Comment: QUIT SMOKING IN 1970'S  . Alcohol Use: Yes     Comment: OCCASIONAL  . Drug Use: No  . Sexual Activity: Not on file   Other Topics Concern  . Not on file   Social History Narrative   Regular exercise-no    Current Outpatient Prescriptions on File Prior to Visit  Medication Sig Dispense Refill  . acetaminophen (TYLENOL) 500 MG tablet Take 500 mg by mouth every 6 (six) hours as needed for pain.      . bromocriptine (PARLODEL) 2.5 MG tablet Take 2.5 mg by mouth daily.      Marland Kitchen docusate sodium 100 MG CAPS Take 100 mg by mouth 2 (two) times  daily.  30 capsule  0  . doxazosin (CARDURA) 8 MG tablet Take 8 mg by mouth at bedtime.       Marland Kitchen HYDROcodone-acetaminophen (NORCO/VICODIN) 5-325 MG per tablet Take 1-2 tablets by mouth every 6 (six) hours as needed.  30 tablet  0  . Liraglutide (VICTOZA) 18 MG/3ML SOLN injection Inject 0.3 mLs (1.8 mg total) into the skin every evening.  9 mL  3  . metFORMIN (GLUCOPHAGE-XR) 500 MG 24 hr tablet Take 2,000 mg by mouth daily with breakfast. 4 tabs each morning      . metoprolol tartrate (LOPRESSOR) 25 MG tablet Take 25 mg by mouth every morning.      . potassium citrate (UROCIT-K) 10 MEQ (1080 MG) SR tablet Take 20 mEq by mouth daily.       . pravastatin (PRAVACHOL) 40 MG tablet Take 40 mg by mouth every evening.       Marland Kitchen  spironolactone (ALDACTONE) 25 MG tablet Take 25 mg by mouth daily as needed.       . Tamsulosin HCl (FLOMAX) 0.4 MG CAPS Take 0.4 mg by mouth 2 (two) times a week. Sundays and Thursday mornings      . valsartan-hydrochlorothiazide (DIOVAN-HCT) 160-25 MG per tablet Take 1 tablet by mouth at bedtime.        No current facility-administered medications on file prior to visit.    Allergies  Allergen Reactions  . Other Cough    Blood pressure medication-reaction was about 18 years ago  . Pioglitazone     edema    Family History  Problem Relation Age of Onset  . Arthritis Other     Parents  . Hypertension Other     Parents  . Diabetes Other     Parents    BP 126/72  Pulse 78  Ht 6' (1.829 m)  Wt 246 lb (111.585 kg)  BMI 33.36 kg/m2  SpO2 98%  Review of Systems denies hypoglycemia and weight cahnge    Objective:   Physical Exam VITAL SIGNS:  See vs page GENERAL: no distress  Lab Results  Component Value Date   HGBA1C 6.5 07/27/2013       Assessment & Plan:  DM: well-controlled. Dyslipidemia: well-controlled.  i did the form CAD: in this setting, he should avoid DM meds causing hypoglycemia.

## 2013-07-27 NOTE — Patient Instructions (Addendum)
check your blood sugar once a day.  vary the time of day when you check, between before the 3 meals, and at bedtime.  also check if you have symptoms of your blood sugar being too high or too low.  please keep a record of the readings and bring it to your next appointment here.  please call us sooner if you are having low blood sugar episodes, or if it is running above 150.   Please come back for a follow-up appointment in 3 months.   blood tests are being requested for you today.  We'll contact you with results.

## 2013-08-11 ENCOUNTER — Other Ambulatory Visit: Payer: Self-pay | Admitting: *Deleted

## 2013-08-11 MED ORDER — METFORMIN HCL ER 500 MG PO TB24: 2000.0000 mg | ORAL_TABLET | Freq: Every day | ORAL | Status: DC

## 2013-09-24 ENCOUNTER — Other Ambulatory Visit: Payer: Self-pay

## 2013-09-29 ENCOUNTER — Other Ambulatory Visit: Payer: Self-pay | Admitting: Cardiology

## 2013-09-29 DIAGNOSIS — I712 Thoracic aortic aneurysm, without rupture: Secondary | ICD-10-CM

## 2013-10-06 ENCOUNTER — Ambulatory Visit
Admission: RE | Admit: 2013-10-06 | Discharge: 2013-10-06 | Disposition: A | Discharge status: Home / Self Care | Payer: BC Managed Care – PPO | Source: Ambulatory Visit | Attending: Cardiology | Admitting: Cardiology

## 2013-10-06 DIAGNOSIS — I712 Thoracic aortic aneurysm, without rupture: Secondary | ICD-10-CM

## 2013-10-28 ENCOUNTER — Ambulatory Visit (INDEPENDENT_AMBULATORY_CARE_PROVIDER_SITE_OTHER): Payer: BC Managed Care – PPO | Admitting: Endocrinology

## 2013-10-28 ENCOUNTER — Encounter: Payer: Self-pay | Admitting: Endocrinology

## 2013-10-28 VITALS — BP 128/80 | HR 75 | Temp 97.5°F | Ht 72.0 in | Wt 253.0 lb

## 2013-10-28 DIAGNOSIS — E119 Type 2 diabetes mellitus without complications: Secondary | ICD-10-CM

## 2013-10-28 LAB — MICROALBUMIN / CREATININE URINE RATIO
Creatinine,U: 124.1 mg/dL
Microalb Creat Ratio: 5.1 mg/g (ref 0.0–30.0)
Microalb, Ur: 6.3 mg/dL — ABNORMAL HIGH (ref 0.0–1.9)

## 2013-10-28 LAB — HEMOGLOBIN A1C: Hgb A1c MFr Bld: 8 % — ABNORMAL HIGH (ref 4.6–6.5)

## 2013-10-28 MED ORDER — NATEGLINIDE 120 MG PO TABS: 120.0000 mg | ORAL_TABLET | Freq: Three times a day (TID) | ORAL | Status: DC

## 2013-10-28 NOTE — Patient Instructions (Addendum)
check your blood sugar once a day.  vary the time of day when you check, between before the 3 meals, and at bedtime.  also check if you have symptoms of your blood sugar being too high or too low.  please keep a record of the readings and bring it to your next appointment here.  please call us sooner if you are having low blood sugar episodes, or if it is running above 150.   Please come back for a follow-up appointment in 6 months.   blood tests are being requested for you today.  We'll contact you with results.

## 2013-10-28 NOTE — Progress Notes (Signed)
Subjective:    Patient ID: Matthew Schmitt, male    DOB: 1951/08/26, 62 y.o.   MRN: [id number]  HPI Pt returns for f/u if type 2 DM (dx'ed 2002, on a routine blood test; he has mild if any neuropathy of the lower extremities; he has associated nephropathy, h/o leg ulcers, and CAD; he takes victoza and 3 oral agents; he did not tolerate actos, due to edema; he has never been on insulin).  pt states he feels well in general, except for low-back pain.  He takes invokana approx qod, due to polyuria.  Past Medical History  Diagnosis Date  . Impotence of organic origin   . Mixed hyperlipidemia     slightly  . Essential hypertension, benign   . Type II or unspecified type diabetes mellitus without mention of complication, uncontrolled   . History of kidney stones   . Coronary artery disease   . Anginal pain   . RBBB   . Atrial fibrillation May 2014    "heart went to fast"  . GERD (gastroesophageal reflux disease)     occasional  . Arthritis     everywhere    Past Surgical History  Procedure Laterality Date  . Knee surgery  1978  &  1974    BILATERAL  . Cardiac catheterization  08-17-2011  DR SPENCER TILLEY    POSSIBLE MODERATE LAD STENOSIS JUST AFTER THE BIFURCATION OF LARGE DIAGONAL BRANCH/ LVEF  40-45%/ MILD GLOBAL HYPODINESIS (CATH DONE FOR ABNORMAL STRESS TEST OF FIXED LATERAL WALL DEFECT & BIFASCICULAR BLAOCK)  . Tonsillectomy  27  (age 31)  . Cystoscopy with retrograde pyelogram, ureteroscopy and stent placement  11/03/2012    Procedure: CYSTOSCOPY WITH RETROGRADE PYELOGRAM, URETEROSCOPY AND STENT PLACEMENT;  Surgeon: Valetta Fuller, MD;  Location: [facility];  Service: Urology;  Laterality: Left;  Cystoscopy/Left Retrograde/Ureteroscopy/Holmium Laser Litho/Double J Stent  . Holmium laser application  11/03/2012    Procedure: HOLMIUM LASER APPLICATION;  Surgeon: Valetta Fuller, MD;  Location: [facility];  Service: Urology;  Laterality: Left;  .  Cardioversion N/A 04/02/2013    Procedure: CARDIOVERSION;  Surgeon: Othella Boyer, MD;  Location: [facility] OR;  Service: Cardiovascular;  Laterality: N/A;  . Pilonidal cyst excision    . Robotic assited partial nephrectomy Left 06/18/2013    Procedure: ROBOTIC ASSISTED PARTIAL NEPHRECTOMY;  Surgeon: [name], MD;  Location: [facility];  Service: Urology;  Laterality: Left;    History   Social History  . Marital Status: Married    Spouse Name: N/A    Number of Children: N/A  . Years of Education: 16   Occupational History  . Emergency planning/management officer, Mining engineer    Social History Main Topics  . Smoking status: Former Smoker -- 1.00 packs/day for 6 years    Types: Cigarettes    Quit date: 10/31/1969  . Smokeless tobacco: Never Used     Comment: QUIT SMOKING IN 1970'S  . Alcohol Use: Yes     Comment: OCCASIONAL  . Drug Use: No  . Sexual Activity: Not on file   Other Topics Concern  . Not on file   Social History Narrative   Regular exercise-no    Current Outpatient Prescriptions on File Prior to Visit  Medication Sig Dispense Refill  . acetaminophen (TYLENOL) 500 MG tablet Take 500 mg by mouth every 6 (six) hours as needed for pain.      . bromocriptine (PARLODEL) 2.5 MG tablet Take 2.5 mg by  mouth daily.      . Canagliflozin (INVOKANA) 300 MG TABS Take 1 tablet by mouth daily.      Marland Kitchen docusate sodium 100 MG CAPS Take 100 mg by mouth 2 (two) times daily.  30 capsule  0  . doxazosin (CARDURA) 8 MG tablet Take 8 mg by mouth at bedtime.       Marland Kitchen HYDROcodone-acetaminophen (NORCO/VICODIN) 5-325 MG per tablet Take 1-2 tablets by mouth every 6 (six) hours as needed.  30 tablet  0  . Liraglutide (VICTOZA) 18 MG/3ML SOLN injection Inject 0.3 mLs (1.8 mg total) into the skin every evening.  9 mL  3  . metFORMIN (GLUCOPHAGE-XR) 500 MG 24 hr tablet Take 4 tablets (2,000 mg total) by mouth daily with breakfast. 4 tabs each morning  360 tablet  1  . metoprolol tartrate (LOPRESSOR) 25 MG tablet Take  25 mg by mouth every morning.      . potassium citrate (UROCIT-K) 10 MEQ (1080 MG) SR tablet Take 20 mEq by mouth daily.       . pravastatin (PRAVACHOL) 40 MG tablet Take 40 mg by mouth every evening.       Marland Kitchen spironolactone (ALDACTONE) 25 MG tablet Take 25 mg by mouth daily as needed.       . Tamsulosin HCl (FLOMAX) 0.4 MG CAPS Take 0.4 mg by mouth 2 (two) times a week. Sundays and Thursday mornings      . valsartan-hydrochlorothiazide (DIOVAN-HCT) 160-25 MG per tablet Take 1 tablet by mouth at bedtime.        No current facility-administered medications on file prior to visit.   Allergies  Allergen Reactions  . Other Cough    Blood pressure medication-reaction was about 18 years ago  . Pioglitazone     edema   Family History  Problem Relation Age of Onset  . Arthritis Other     Parents  . Hypertension Other     Parents  . Diabetes Other     Parents   BP 128/80  Pulse 75  Temp(Src) 97.5 F (36.4 C) (Oral)  Ht 6' (1.829 m)  Wt 253 lb (114.76 kg)  BMI 34.31 kg/m2  SpO2 97%  Review of Systems denies hypoglycemia and weight change.      Objective:   Physical Exam VITAL SIGNS:  See vs page GENERAL: no distress  Lab Results  Component Value Date   HGBA1C 8.0* 10/28/2013      Assessment & Plan:  DM: he needs increased rx.  We discussed the nine oral agents available for type 2 diabetes.  This regimen gives the best risk-benefit ratio.   CAD: in this setting, he should avoid DM meds causing hypoglycemia. Polyuria: this limits invokana rx.

## 2013-11-04 ENCOUNTER — Encounter: Payer: Self-pay | Admitting: Cardiology

## 2013-12-18 ENCOUNTER — Ambulatory Visit (HOSPITAL_BASED_OUTPATIENT_CLINIC_OR_DEPARTMENT_OTHER): Payer: BC Managed Care – PPO | Attending: Cardiology

## 2013-12-18 VITALS — Ht 72.0 in | Wt 255.0 lb

## 2013-12-18 DIAGNOSIS — I4891 Unspecified atrial fibrillation: Secondary | ICD-10-CM

## 2013-12-18 DIAGNOSIS — G4733 Obstructive sleep apnea (adult) (pediatric): Secondary | ICD-10-CM | POA: Insufficient documentation

## 2013-12-18 DIAGNOSIS — I4949 Other premature depolarization: Secondary | ICD-10-CM | POA: Insufficient documentation

## 2013-12-27 DIAGNOSIS — I4891 Unspecified atrial fibrillation: Secondary | ICD-10-CM

## 2013-12-27 NOTE — Sleep Study (Signed)
   NAME: Matthew Schmitt DATE OF BIRTH:  1951/04/30 MEDICAL RECORD NUMBER [id number]  LOCATION: [facility]  PHYSICIAN: Shepherd Finnan D  DATE OF STUDY: 12/18/2013  SLEEP STUDY TYPE: Nocturnal Polysomnogram               REFERRING PHYSICIAN: Jacolyn Reedy, MD  INDICATION FOR STUDY: Hypersomnia with sleep apnea  EPWORTH SLEEPINESS SCORE:   13/24 HEIGHT: 6' (182.9 cm)  WEIGHT: 255 lb (115.667 kg)    Body mass index is 34.58 kg/(m^2).  NECK SIZE: 17 in.  MEDICATIONS: Charted for review  SLEEP ARCHITECTURE:  Total sleep time 315.5 minutes with sleep efficiency 79.7%. Stage I was 11.9%, stage II 72.6%, stage III absent, REM 15.5% of total sleep time. Sleep latency 15 minutes, REM latency 5 minutes, awake after sleep onset 64 minutes, arousal index 17.5. Bedtime medication: None  RESPIRATORY DATA: Apnea hypopnea index (AHI) 9.5 per hour. 50 events were scored including 9 obstructive apneas and 41 hypopneas. Events were not positional. REM AHI 17.1 per hour. The study was ordered as a diagnostic polysomnogram without CPAP.  OXYGEN DATA: Loud snoring with oxygen desaturation to a nadir of 85% and mean oxygen saturation through the study of 93.6% on room air.  CARDIAC DATA: Sinus rhythm with PVCs  MOVEMENT/PARASOMNIA: No significant movement disturbance. Bathroom x1  IMPRESSION/ RECOMMENDATION:   1) Mild obstructive sleep apnea/hypopneas syndrome, AHI 9.5 per hour. Non-positional events. Loud snoring with oxygen desaturation to a nadir of 85% and mean oxygen saturation through the study of 93.6% on room air.  2) The study was ordered as a diagnostic polysomnogram without CPAP. If appropriate, this patient can schedule with the sleep disorders Center for a dedicated CPAP titration study.  Signed Baird Lyons M.D. Deneise Lever Diplomate, American Board of Sleep Medicine  ELECTRONICALLY SIGNED ON:  12/27/2013, 12:47 PM [place] PH: [phone]  [phone]   FX: [phone] [place]

## 2014-01-06 NOTE — Progress Notes (Signed)
Patient ID: Anatole Apollo, male   DOB: 10/07/1951, 63 y.o.   MRN: [id number]  Tylin, Force  Date of visit:  11/04/2013 DOB:  [birth year]    Age:  63 yrs. Medical record number:  [id number] Account number:  [id number] Primary Care Provider: Daiva Eves ____________________________ CURRENT DIAGNOSES  1. CAD,Native  2. Atrial flutter  3. Hypertensive Heart Disease-Benign without CHF  4. Long Term Use Anticoagulant  5. Conduction Disorder-RBBB w/LAFB  6. Aortic Valve-Regurgitation  7. Aneurysm-Ascending Thoracic Aorta  8. Obesity(BMI30-40)  9. Diabetes Mellitus-NIDD ____________________________ ALLERGIES  No Known Drug Allergies ____________________________ MEDICATIONS  1. doxazosin 8 mg tablet, 1 p.o. daily  2. tamsulosin 0.4 mg capsule,extended release 24hr, twice a week (sunday and thursday)  3. pravastatin 40 mg tablet, 1 p.o. daily  4. metformin 500 mg tablet extended release 24 hr, 4 qam  5. Victoza 2-Pak 0.6 mg/0.1 mL (18 mg/3 mL) pen injector, 18mg  qd  6. valsartan-hydrochlorothiazide 160-25 mg tablet, 1 p.o. daily  7. metoprolol tartrate 25 mg tablet, BID  8. bromocriptine 2.5 mg tablet, 1 p.o. daily  9. Invokana 300 mg tablet, 4 x week  10. Urocit-K 15 15 mEq (1,620 mg) tablet,extended release, 3 x week  11. aspirin 81 mg chewable tablet, 1 p.o. daily ____________________________ CHIEF COMPLAINTS  Followup of Atrial fibrillation  Followup of CAD,Native ____________________________ HISTORY OF PRESENT ILLNESS Patient returns for cardiac followup. He is now off of his anticoagulation with Xarelto and it were a cardiac event monitor that showed no significant atrial flutter or atrial arrhythmias. He has a prior history of atrial flutter treated with cardioversion. He has had no recurrence of renal bleeding. That was the reason he was taken off of Xarelto. He snores but has never had a sleep study. He also had a CT scan that showed a 4.3 cm aorta. He is not having any chest  pain or shortness of breath. ____________________________ PAST HISTORY  Past Medical Illnesses:  hypertension, DM-non-insulin dependent, obesity, kidney stones;  Cardiovascular Illnesses:  atrial flutter May 2014;  Infectious Diseases:  no previous history of significant infectious diseases;  Surgical Procedures:  Knee surgery bilaterally, pilonidal cyst, tonsillectomy;  Trauma History:  no previous history of significant trauma;  Cardiology Procedures-Invasive:  cardiac cath (left) September 2012, cardioversion May 2014;  Cardiology Procedures-Noninvasive:  treadmill cardiolite September 2012, echocardiogram April 2013, echocardiogram May 2014, echocardiogram July 2014, event monitor November 2014, chest CT November 2014;  Cardiac Cath Results:  normal Left main, 50% stenosis mid LAD, scattered irregularities CFX, scattered irregularities RCA;  LVEF of 55% documented via echocardiogram on 06/03/2013,   CHA2DS2-VASC Score:  2 ____________________________ CARDIO-PULMONARY TEST DATES EKG Date:  04/09/2013;   Cardiac Cath Date:  08/17/2011;  Holter/Event Monitor Date: 09/30/2013;  Nuclear Study Date:  07/27/2011;  Echocardiography Date: 06/03/2013;  Chest Xray Date: 04/01/2013;  CT Scan Date:  10/06/2013   ____________________________ FAMILY HISTORY Brother -- Brother alive and well Brother -- Coronary Artery Disease, Brother alive with problem Father -- Diabetes mellitus, Father alive with problem Mother -- Mother dead, Diabetes mellitus ____________________________ SOCIAL HISTORY Alcohol Use:  occasionally;  Smoking:  used to smoke but quit 1985;  Diet:  regular diet;  Lifestyle:  married and 2 children;  Exercise:  no regular exercise;  Occupation:  Field seismologist;  Residence:  lives with wife;   ____________________________ PHYSICAL EXAMINATION VITAL SIGNS  Blood Pressure:  144/74 Sitting, Right arm, regular cuff  , 140/70 Standing, Right arm and regular cuff  Pulse:  68/min.  Weight:  257.50 lbs. Height:  72"BMI: 35  Constitutional:  pleasant white male in no acute distress, moderately obese Skin:  warm and dry to touch, no apparent skin lesions, or masses noted. Head:  normocephalic, normal hair pattern, no masses or tenderness Neck:  supple, without massess. No JVD, thyromegaly or carotid bruits. Carotid upstroke normal. Chest:  normal symmetry, clear to auscultation Cardiac:  regular rhythm, normal S1 and S2, no S3 or S4, grade 1/6 systolic murmur, 1/6 diastolic murmur Extremities & Back:  no deformities, clubbing, cyanosis, erythema or edema observed. Normal muscle strength and tone. Neurological:  no gross motor or sensory deficits noted, affect appropriate, oriented x3. ____________________________ MOST RECENT LIPID PANEL 09/22/13  CHOL TOTL 107 mg/dl, LDL 52 NM, HDL 29 mg/dl and TRIGLYCER 128 mg/dl ____________________________ IMPRESSIONS/PLAN  1. History of paroxysmal atrial flutter 2. Significant urinary bleeding previously on Xarelto 3. Ascending aortic aneurysm 4. Obesity with need to lose weight 5. Hypertensive heart disease 6. Possible sleep apnea  Recommendations:  Discussed importance of weight loss and suggested that he have a sleep study. We discussed the ascending aorta dimensions and recommended a repeat CT scan in 6 months to ensure that it is not growing. I also discussed that if he has recurrent atrial flutter that he may be considered for radiofrequency ablation. Return visit in 6 months. ____________________________ TODAYS ORDERS  1. Sleep Study without c-pap: At Patient Convenience  2. CT Chest w/o Contrast: 6 months  3. Return Visit: 6 months                       ____________________________ Cardiology Physician:  Kerry Hough MD [facility]

## 2014-02-05 ENCOUNTER — Ambulatory Visit: Payer: BC Managed Care – PPO

## 2014-02-05 ENCOUNTER — Ambulatory Visit (INDEPENDENT_AMBULATORY_CARE_PROVIDER_SITE_OTHER): Payer: BC Managed Care – PPO | Admitting: Emergency Medicine

## 2014-02-05 VITALS — BP 124/70 | HR 86 | Temp 97.9°F | Resp 16 | Ht 71.5 in | Wt 259.0 lb

## 2014-02-05 DIAGNOSIS — R109 Unspecified abdominal pain: Secondary | ICD-10-CM

## 2014-02-05 DIAGNOSIS — M25551 Pain in right hip: Secondary | ICD-10-CM

## 2014-02-05 DIAGNOSIS — M25559 Pain in unspecified hip: Secondary | ICD-10-CM

## 2014-02-05 DIAGNOSIS — R319 Hematuria, unspecified: Secondary | ICD-10-CM

## 2014-02-05 LAB — POCT CBC
Granulocyte percent: 76.7 %G (ref 37–80)
HCT, POC: 44.7 % (ref 43.5–53.7)
Hemoglobin: 14.7 g/dL (ref 14.1–18.1)
Lymph, poc: 1 (ref 0.6–3.4)
MCH, POC: 27.9 pg (ref 27–31.2)
MCHC: 32.9 g/dL (ref 31.8–35.4)
MCV: 84.8 fL (ref 80–97)
MID (cbc): 0.2 (ref 0–0.9)
MPV: 10.7 fL (ref 0–99.8)
POC Granulocyte: 4 (ref 2–6.9)
POC LYMPH PERCENT: 19.2 %L (ref 10–50)
POC MID %: 4.1 %M (ref 0–12)
Platelet Count, POC: 170 10*3/uL (ref 142–424)
RBC: 5.27 M/uL (ref 4.69–6.13)
RDW, POC: 13.8 %
WBC: 5.2 10*3/uL (ref 4.6–10.2)

## 2014-02-05 LAB — POCT URINALYSIS DIPSTICK
Bilirubin, UA: NEGATIVE
Glucose, UA: 500
Ketones, UA: NEGATIVE
Leukocytes, UA: NEGATIVE
Nitrite, UA: NEGATIVE
Protein, UA: NEGATIVE
Spec Grav, UA: 1.01
Urobilinogen, UA: 0.2
pH, UA: 5

## 2014-02-05 LAB — POCT UA - MICROSCOPIC ONLY
Bacteria, U Microscopic: NEGATIVE
Casts, Ur, LPF, POC: NEGATIVE
Crystals, Ur, HPF, POC: NEGATIVE
Mucus, UA: NEGATIVE
Yeast, UA: NEGATIVE

## 2014-02-05 MED ORDER — KETOROLAC TROMETHAMINE 60 MG/2ML IM SOLN
60.0000 mg | Freq: Once | INTRAMUSCULAR | Status: AC
  Administered 2014-02-05: 60 mg via INTRAMUSCULAR

## 2014-02-05 NOTE — Progress Notes (Signed)
Subjective:    Patient ID: Matthew Schmitt, male    DOB: [birth year], 63 y.o.   MRN: [id number]  HPI   Matthew Schmitt is a very pleasant 63 yr old male here with concern for right hip pain.  Pain started suddenly last night about 9:30pm - progressively worsening.  He localizes the pain to his right hip, but points more to right flank when he says this.    No prior hip problems.  He did fall on the ice about 4 wks but does not recall injuring the hip.  He has no trouble bearing weight.  No other known injury to the hip.  The pain radiates somewhat down anterior thigh.  It does not radiate down the back of his leg.  He has no numbness, weakness, tingling.  No changes in gait.  No loss of bowel or bladder control  No fever or chills.  He has felt well.  No dysuria but has noted gross hematuria - he attributes this to pain medicine that he took last night.  Took Norco 5-500 at about 3:30am.  Was unable to sleep due to pain.  Feels somewhat more comfortable when up an moving around.    Denies abd pain, NVD.  Reports he is followed by urology - Dr. Risa Grill.  Left renal mass removed in 2014.  Known large right kidney stone.  Has had kidney stones in the past - this pain feels different though.    Review of Systems  Constitutional: Negative for fever and chills.  Respiratory: Negative for cough, shortness of breath and wheezing.   Cardiovascular: Negative.   Gastrointestinal: Negative for nausea, vomiting and abdominal pain.  Genitourinary: Positive for hematuria and flank pain. Negative for dysuria.  Musculoskeletal: Positive for arthralgias. Negative for gait problem.  Skin: Negative.        Objective:   Physical Exam  Vitals reviewed. Constitutional: He is oriented to person, place, and time. He appears well-developed and well-nourished. No distress.  HENT:  Head: Normocephalic and atraumatic.  Eyes: Conjunctivae are normal. No scleral icterus.  Cardiovascular: Normal rate, regular rhythm,  normal heart sounds and intact distal pulses.   Pulmonary/Chest: Effort normal and breath sounds normal. He has no wheezes. He has no rales.  Abdominal: There is tenderness (slight) in the right lower quadrant. There is no guarding, no CVA tenderness and no tenderness at McBurney's point.  Musculoskeletal:       Right hip: He exhibits normal range of motion, normal strength, no tenderness and no bony tenderness.  No MSK tenderness  Neurological: He is alert and oriented to person, place, and time. He has normal reflexes.  Skin: Skin is warm and dry.  Psychiatric: He has a normal mood and affect. His behavior is normal.     [facility] reading (PRIMARY) by  Dr. Everlene Farrier - 1cm sclerotic lesion proximal femur - please comment; otherwise unremarkable  Results for orders placed in visit on 02/05/14  POCT UA - MICROSCOPIC ONLY      Result Value Ref Range   WBC, Ur, HPF, POC 0-1     RBC, urine, microscopic TNTC     Bacteria, U Microscopic neg     Mucus, UA neg     Epithelial cells, urine per micros 0-1     Crystals, Ur, HPF, POC neg     Casts, Ur, LPF, POC neg     Yeast, UA neg    POCT URINALYSIS DIPSTICK      Result Value Ref Range  Color, UA yellow     Clarity, UA cloudy     Glucose, UA 500     Bilirubin, UA neg     Ketones, UA neg     Spec Grav, UA 1.010     Blood, UA large     pH, UA 5.0     Protein, UA neg     Urobilinogen, UA 0.2     Nitrite, UA neg     Leukocytes, UA Negative    POCT CBC      Result Value Ref Range   WBC 5.2  4.6 - 10.2 K/uL   Lymph, poc 1.0  0.6 - 3.4   POC LYMPH PERCENT 19.2  10 - 50 %L   MID (cbc) 0.2  0 - 0.9   POC MID % 4.1  0 - 12 %M   POC Granulocyte 4.0  2 - 6.9   Granulocyte percent 76.7  37 - 80 %G   RBC 5.27  4.69 - 6.13 M/uL   Hemoglobin 14.7  14.1 - 18.1 g/dL   HCT, POC 44.7  43.5 - 53.7 %   MCV 84.8  80 - 97 fL   MCH, POC 27.9  27 - 31.2 pg   MCHC 32.9  31.8 - 35.4 g/dL   RDW, POC 13.8     Platelet Count, POC 170  142 - 424 K/uL   MPV 10.7   0 - 99.8 fL       Assessment & Plan:  Right hip pain - Plan: DG Hip Complete Right, POCT CBC, ketorolac (TORADOL) injection 60 mg  Right flank pain - Plan: POCT UA - Microscopic Only, POCT urinalysis dipstick, DG Hip Complete Right, POCT CBC, ketorolac (TORADOL) injection 60 mg  Hematuria - Plan: POCT UA - Microscopic Only, POCT urinalysis dipstick, POCT CBC   Matthew Schmitt is a very pleasant 63 yr old male here with sudden onset of right flank pain last evening - progressively worsening.  He has gross hematuria.  Hip xrays are negative, and his hip exam is unremarkable.  I do no think the pain is coming from the hip joint.  With gross hematuria and known renal stones, concerned that this may be the cause of his pain.  No evidence of infection, CBC normal.  Given his history, and that he is already established with urology, I think it would be best to have him evaluated there.  Dr. Cy Blamer office has kindly agreed to see the patient this morning.  He will proceed to their office.  Pt to be in contact with me this weekend if any symptoms worsening or not improving.    Alonza Smoker MHS, PA-C [facility] 3/20/20159:57 AM

## 2014-02-05 NOTE — Patient Instructions (Signed)
Go straight to Dr. Cy Blamer office - the nurse practitioner will see you there at 10:15am  [address], [place], [place] [phone]

## 2014-03-04 ENCOUNTER — Other Ambulatory Visit (HOSPITAL_COMMUNITY): Payer: Self-pay | Admitting: Urology

## 2014-03-04 ENCOUNTER — Other Ambulatory Visit: Payer: Self-pay | Admitting: Urology

## 2014-03-04 DIAGNOSIS — N200 Calculus of kidney: Secondary | ICD-10-CM

## 2014-03-06 ENCOUNTER — Other Ambulatory Visit: Payer: Self-pay | Admitting: Endocrinology

## 2014-03-17 ENCOUNTER — Other Ambulatory Visit (HOSPITAL_COMMUNITY): Payer: BC Managed Care – PPO

## 2014-03-23 ENCOUNTER — Other Ambulatory Visit: Payer: Self-pay | Admitting: Dermatology

## 2014-04-01 ENCOUNTER — Other Ambulatory Visit: Payer: Self-pay | Admitting: Endocrinology

## 2014-04-28 ENCOUNTER — Encounter: Payer: Self-pay | Admitting: Endocrinology

## 2014-04-28 ENCOUNTER — Ambulatory Visit (INDEPENDENT_AMBULATORY_CARE_PROVIDER_SITE_OTHER): Payer: BC Managed Care – PPO | Admitting: Endocrinology

## 2014-04-28 VITALS — BP 122/64 | HR 84 | Temp 98.2°F | Ht 71.0 in | Wt 252.0 lb

## 2014-04-28 DIAGNOSIS — E119 Type 2 diabetes mellitus without complications: Secondary | ICD-10-CM

## 2014-04-28 MED ORDER — INSULIN DETEMIR 100 UNIT/ML FLEXPEN: 30.0000 [IU] | PEN_INJECTOR | SUBCUTANEOUS | Status: DC

## 2014-04-28 NOTE — Progress Notes (Signed)
Subjective:    Patient ID: Matthew Schmitt, male    DOB: May 18, 1951, 63 y.o.   MRN: [id number]  HPI Pt returns for f/u if type 2 DM (dx'ed 2002, on a routine blood test; he has mild if any neuropathy of the lower extremities; he has associated nephropathy, h/o leg ulcers, and CAD; he takes victoza and 3 oral agents; he did not tolerate actos, due to edema; he has never been on insulin).  He stopped the invokana.  He is willing to take insulin, but only qd.  no cbg record, but states cbg's vary from 112-200's.  It is in general higher as the day goes on. Past Medical History  Diagnosis Date  . Impotence of organic origin   . Mixed hyperlipidemia     slightly  . Essential hypertension, benign   . Type II or unspecified type diabetes mellitus without mention of complication, uncontrolled   . History of kidney stones   . Coronary artery disease   . Anginal pain   . RBBB   . Atrial fibrillation May 2014    "heart went to fast"  . GERD (gastroesophageal reflux disease)     occasional  . Arthritis     everywhere    Past Surgical History  Procedure Laterality Date  . Knee surgery  1978  &  1974    BILATERAL  . Cardiac catheterization  08-17-2011  DR SPENCER TILLEY    POSSIBLE MODERATE LAD STENOSIS JUST AFTER THE BIFURCATION OF LARGE DIAGONAL BRANCH/ LVEF  40-45%/ MILD GLOBAL HYPODINESIS (CATH DONE FOR ABNORMAL STRESS TEST OF FIXED LATERAL WALL DEFECT & BIFASCICULAR BLAOCK)  . Tonsillectomy  29  (age 78)  . Cystoscopy with retrograde pyelogram, ureteroscopy and stent placement  11/03/2012    Procedure: CYSTOSCOPY WITH RETROGRADE PYELOGRAM, URETEROSCOPY AND STENT PLACEMENT;  Surgeon: Bernestine Amass, MD;  Location: [facility];  Service: Urology;  Laterality: Left;  Cystoscopy/Left Retrograde/Ureteroscopy/Holmium Laser Litho/Double J Stent  . Holmium laser application  25/03/3975    Procedure: HOLMIUM LASER APPLICATION;  Surgeon: Bernestine Amass, MD;  Location: [facility];  Service: Urology;  Laterality: Left;  . Cardioversion N/A 04/02/2013    Procedure: CARDIOVERSION;  Surgeon: Jacolyn Reedy, MD;  Location: [facility];  Service: Cardiovascular;  Laterality: N/A;  . Pilonidal cyst excision    . Robotic assited partial nephrectomy Left 06/18/2013    Procedure: ROBOTIC ASSISTED PARTIAL NEPHRECTOMY;  Surgeon: Dutch Gray, MD;  Location: [facility];  Service: Urology;  Laterality: Left;    History   Social History  . Marital Status: Married    Spouse Name: N/A    Number of Children: N/A  . Years of Education: 16   Occupational History  . Government social research officer, Psychologist, occupational    Social History Main Topics  . Smoking status: Former Smoker -- 1.00 packs/day for 6 years    Types: Cigarettes    Quit date: 10/31/1969  . Smokeless tobacco: Never Used     Comment: QUIT SMOKING IN 1970'S  . Alcohol Use: Yes     Comment: OCCASIONAL  . Drug Use: No  . Sexual Activity: Not on file   Other Topics Concern  . Not on file   Social History Narrative   Regular exercise-no    Current Outpatient Prescriptions on File Prior to Visit  Medication Sig Dispense Refill  . acetaminophen (TYLENOL) 500 MG tablet Take 500 mg by mouth every 6 (six) hours as needed for pain.      Marland Kitchen  aspirin 81 MG tablet Take 81 mg by mouth daily.      Marland Kitchen doxazosin (CARDURA) 8 MG tablet Take 8 mg by mouth at bedtime.       . metoprolol tartrate (LOPRESSOR) 25 MG tablet Take 25 mg by mouth every morning.      . pravastatin (PRAVACHOL) 40 MG tablet Take 40 mg by mouth every evening.       . Tamsulosin HCl (FLOMAX) 0.4 MG CAPS Take 0.4 mg by mouth 2 (two) times a week. Sundays and Thursday mornings      . valsartan-hydrochlorothiazide (DIOVAN-HCT) 160-25 MG per tablet Take 1 tablet by mouth at bedtime.       Marland Kitchen HYDROcodone-acetaminophen (NORCO/VICODIN) 5-325 MG per tablet Take 1-2 tablets by mouth every 6 (six) hours as needed.  30 tablet  0  . spironolactone (ALDACTONE) 25 MG tablet Take 25  mg by mouth daily as needed.        No current facility-administered medications on file prior to visit.    Allergies  Allergen Reactions  . Other Cough    Blood pressure medication-reaction was about 18 years ago  . Pioglitazone     edema    Family History  Problem Relation Age of Onset  . Arthritis Other     Parents  . Hypertension Other     Parents  . Diabetes Other     Parents    BP 122/64  Pulse 84  Temp(Src) 98.2 F (36.8 C) (Oral)  Ht 5\' 11"  (1.803 m)  Wt 252 lb (114.306 kg)  BMI 35.16 kg/m2  SpO2 96%   Review of Systems He denies hypoglycemia.  He has lost a few lbs.    Objective:   Physical Exam Pulses: dorsalis pedis intact bilat.  Feet: no deformity. feet are of normal color and temp. Trace bilat edema, healed ulcers, and severe hyperpigmentation of the ant tibial areas  Skin: no ulcer on the feet.  Neuro: sensation is intact to touch on the feet.    outside test results are reviewed: A1c=9.2    Assessment & Plan:  DM: moderate exacerbation: he agrees to start insulin. CAD: in this setting, he should avoid hypoglycemia: This impairs the ability to achieve glycemic control.  I'll work around this as best I can.    Patient is advised the following: Patient Instructions  check your blood sugar once a day.  vary the time of day when you check, between before the 3 meals, and at bedtime.  also check if you have symptoms of your blood sugar being too high or too low.  please keep a record of the readings and bring it to your next appointment here.  please call us sooner if you are having low blood sugar episodes, or if it is running above 150.   Please stop the victoza, metformin, nateglinide, and bromocriptine. Start levemir, 30 units each morning.  This is probably mot enough. Call if it is high, so we can increase.  Please come back for a follow-up appointment in 1 month.

## 2014-04-28 NOTE — Patient Instructions (Addendum)
check your blood sugar once a day.  vary the time of day when you check, between before the 3 meals, and at bedtime.  also check if you have symptoms of your blood sugar being too high or too low.  please keep a record of the readings and bring it to your next appointment here.  please call us sooner if you are having low blood sugar episodes, or if it is running above 150.   Please stop the victoza, metformin, nateglinide, and bromocriptine. Start levemir, 30 units each morning.  This is probably mot enough. Call if it is high, so we can increase.  Please come back for a follow-up appointment in 1 month.

## 2014-05-05 ENCOUNTER — Encounter (HOSPITAL_COMMUNITY): Payer: Self-pay | Admitting: Pharmacy Technician

## 2014-05-10 NOTE — Patient Instructions (Signed)
Matthew Schmitt  05/10/2014   Your procedure is scheduled on:  05/17/2013  1130am-1400pm.  Radiology part of procedure will take place at 0930am.    Report to [facility]. Report to Radiology at 0730am..  From there you will be taken to Short Stay where the nurses will get you ready.    Call this number if you have problems the morning of surgery: [phone]   Remember:   Do not eat food or drink liquids after midnight.   Take these medicines the morning of surgery with A SIP OF WATER:    Do not wear jewelry,   Do not wear lotions, powders, or perfumes, deodorant.    . Men may shave face and neck.  Do not bring valuables to the hospital.  Contacts, dentures or bridgework may not be worn into surgery.  Leave suitcase in the car. After surgery it may be brought to your room.  For patients admitted to the hospital, checkout time is 11:00 AM the day of  discharge.          Please read over the following fact sheets that you were given: [facility] - Preparing for Surgery Before surgery, you can play an important role.  Because skin is not sterile, your skin needs to be as free of germs as possible.  You can reduce the number of germs on your skin by washing with CHG (chlorahexidine gluconate) soap before surgery.  CHG is an antiseptic cleaner which kills germs and bonds with the skin to continue killing germs even after washing. Please DO NOT use if you have an allergy to CHG or antibacterial soaps.  If your skin becomes reddened/irritated stop using the CHG and inform your nurse when you arrive at Short Stay. Do not shave (including legs and underarms) for at least 48 hours prior to the first CHG shower.  You may shave your face/neck. Please follow these instructions carefully:  1.  Shower with CHG Soap the night before surgery and the  morning of Surgery.  2.  If you choose to wash your hair, wash your hair first as usual with your  normal  shampoo.  3.  After you shampoo, rinse  your hair and body thoroughly to remove the  shampoo.                           4.  Use CHG as you would any other liquid soap.  You can apply chg directly  to the skin and wash                       Gently with a scrungie or clean washcloth.  5.  Apply the CHG Soap to your body ONLY FROM THE NECK DOWN.   Do not use on face/ open                           Wound or open sores. Avoid contact with eyes, ears mouth and genitals (private parts).                       Wash face,  Genitals (private parts) with your normal soap.             6.  Wash thoroughly, paying special attention to the area where your surgery  will be performed.  7.  Thoroughly rinse your body with warm  water from the neck down.  8.  DO NOT shower/wash with your normal soap after using and rinsing off  the CHG Soap.                9.  Pat yourself dry with a clean towel.            10.  Wear clean pajamas.            11.  Place clean sheets on your bed the night of your first shower and do not  sleep with pets. Day of Surgery : Do not apply any lotions/deodorants the morning of surgery.  Please wear clean clothes to the hospital/surgery center.  FAILURE TO FOLLOW THESE INSTRUCTIONS MAY RESULT IN THE CANCELLATION OF YOUR SURGERY PATIENT SIGNATURE_________________________________  NURSE SIGNATURE__________________________________  ________________________________________________________________________  WHAT IS A BLOOD TRANSFUSION? Blood Transfusion Information  A transfusion is the replacement of blood or some of its parts. Blood is made up of multiple cells which provide different functions.  Red blood cells carry oxygen and are used for blood loss replacement.  White blood cells fight against infection.  Platelets control bleeding.  Plasma helps clot blood.  Other blood products are available for specialized needs, such as hemophilia or other clotting disorders. BEFORE THE TRANSFUSION  Who gives blood for  transfusions?   Healthy volunteers who are fully evaluated to make sure their blood is safe. This is blood bank blood. Transfusion therapy is the safest it has ever been in the practice of medicine. Before blood is taken from a donor, a complete history is taken to make sure that person has no history of diseases nor engages in risky social behavior (examples are intravenous drug use or sexual activity with multiple partners). The donor's travel history is screened to minimize risk of transmitting infections, such as malaria. The donated blood is tested for signs of infectious diseases, such as HIV and hepatitis. The blood is then tested to be sure it is compatible with you in order to minimize the chance of a transfusion reaction. If you or a relative donates blood, this is often done in anticipation of surgery and is not appropriate for emergency situations. It takes many days to process the donated blood. RISKS AND COMPLICATIONS Although transfusion therapy is very safe and saves many lives, the main dangers of transfusion include:   Getting an infectious disease.  Developing a transfusion reaction. This is an allergic reaction to something in the blood you were given. Every precaution is taken to prevent this. The decision to have a blood transfusion has been considered carefully by your caregiver before blood is given. Blood is not given unless the benefits outweigh the risks. AFTER THE TRANSFUSION  Right after receiving a blood transfusion, you will usually feel much better and more energetic. This is especially true if your red blood cells have gotten low (anemic). The transfusion raises the level of the red blood cells which carry oxygen, and this usually causes an energy increase.  The nurse administering the transfusion will monitor you carefully for complications. HOME CARE INSTRUCTIONS  No special instructions are needed after a transfusion. You may find your energy is better. Speak with  your caregiver about any limitations on activity for underlying diseases you may have. SEEK MEDICAL CARE IF:   Your condition is not improving after your transfusion.  You develop redness or irritation at the intravenous (IV) site. SEEK IMMEDIATE MEDICAL CARE IF:  Any of the following symptoms occur over the  next 12 hours:  Shaking chills.  You have a temperature by mouth above 102 F (38.9 C), not controlled by medicine.  Chest, back, or muscle pain.  People around you feel you are not acting correctly or are confused.  Shortness of breath or difficulty breathing.  Dizziness and fainting.  You get a rash or develop hives.  You have a decrease in urine output.  Your urine turns a dark color or changes to pink, red, or brown. Any of the following symptoms occur over the next 10 days:  You have a temperature by mouth above 102 F (38.9 C), not controlled by medicine.  Shortness of breath.  Weakness after normal activity.  The white part of the eye turns yellow (jaundice).  You have a decrease in the amount of urine or are urinating less often.  Your urine turns a dark color or changes to pink, red, or brown. Document Released: 11/02/2000 Document Revised: 01/28/2012 Document Reviewed: 06/21/2008 ExitCare Patient Information 2014 [place].  _______________________________________________________________________ coughing and deep breathing exercises, leg exercises

## 2014-05-11 ENCOUNTER — Encounter (HOSPITAL_COMMUNITY)
Admission: RE | Admit: 2014-05-11 | Discharge: 2014-05-11 | Disposition: A | Discharge status: Home / Self Care | Payer: BC Managed Care – PPO | Source: Ambulatory Visit | Attending: Urology | Admitting: Urology

## 2014-05-11 ENCOUNTER — Encounter (HOSPITAL_COMMUNITY): Payer: Self-pay

## 2014-05-11 DIAGNOSIS — Z01812 Encounter for preprocedural laboratory examination: Secondary | ICD-10-CM | POA: Insufficient documentation

## 2014-05-11 HISTORY — DX: Cardiac murmur, unspecified: R01.1

## 2014-05-11 LAB — CBC
HCT: 42 % (ref 39.0–52.0)
Hemoglobin: 14.4 g/dL (ref 13.0–17.0)
MCH: 27.9 pg (ref 26.0–34.0)
MCHC: 34.3 g/dL (ref 30.0–36.0)
MCV: 81.4 fL (ref 78.0–100.0)
Platelets: 163 10*3/uL (ref 150–400)
RBC: 5.16 MIL/uL (ref 4.22–5.81)
RDW: 13.1 % (ref 11.5–15.5)
WBC: 4.5 10*3/uL (ref 4.0–10.5)

## 2014-05-11 LAB — BASIC METABOLIC PANEL
BUN: 19 mg/dL (ref 6–23)
CO2: 23 mEq/L (ref 19–32)
Calcium: 9.4 mg/dL (ref 8.4–10.5)
Chloride: 97 mEq/L (ref 96–112)
Creatinine, Ser: 0.89 mg/dL (ref 0.50–1.35)
GFR calc Af Amer: >90 mL/min (ref >90)
GFR calc non Af Amer: 89 mL/min — ABNORMAL LOW (ref >90)
Glucose, Bld: 238 mg/dL — ABNORMAL HIGH (ref 70–99)
Potassium: 3.9 mEq/L (ref 3.7–5.3)
Sodium: 139 mEq/L (ref 137–147)

## 2014-05-11 NOTE — Progress Notes (Addendum)
EKG- 06/21/13 EPIC  CXR- 06/02/13 EPIC  Chest CT 11/18 14 EPIC  ECHO 2014 EPIC  Sleep Study 12/18/13- EPIC

## 2014-05-14 ENCOUNTER — Other Ambulatory Visit: Payer: Self-pay | Admitting: Radiology

## 2014-05-14 ENCOUNTER — Telehealth: Payer: Self-pay

## 2014-05-14 NOTE — Telephone Encounter (Signed)
Pt called to report blood sugars. Since last Saturday pt's blood sugar has ranged from 216-286. Lowest reading has been 186. Pt was instructed to call back with readings if they continued to be over 150. Confirmed that pt is taking 30 units of Levemir at bedtime. Could you please advise during Dr. Cordelia Pen absence.  Thanks!

## 2014-05-14 NOTE — Telephone Encounter (Signed)
Noted, left voicemail informing pt of changes in insulin.

## 2014-05-14 NOTE — H&P (Signed)
Matthew Schmitt presents now for definitive percutaneous nephrolithotomy to manage his 2 1/2 centimeter renal calculus.  Nephrostomy tube access is to be placed by interventional radiology followed by definitive cutaneous nephrolithotomy.  He will be kept at least overnight for postoperative monitoring and care.        Reason For Visit   Matthew Schmitt returns for followup. I saw him in February of this year because of some ongoing hematuria. The paragraph below will outline some of that history. The patient subsequently came in in mid March to see Matthew Schmitt. A hematuria protocol CT was performed. He had no evidence of any obvious obstruction. He does have significant stone burden, again, primarily in the lower pole of the right kidney. He has continued to have intermittent right-sided back and flank discomfort. He is here to discuss those findings.   He has not had the degree of hematuria that he was previously experiencing. KUB was obtained today. This stone is easily visualized primarily in the lower pole. It measures approximately 2.5 cm x 2.0 cm. I do not see any obvious calcifications over the course of either ureter.     History of Present Illness   Matthew Schmitt has a history of nephrolithiasis. He is status post a partial nephrectomy for what turned out to be a 2 cm oncocytoma. Matthew Schmitt subsequently came in to see me with gross hematuria. He underwent further assessment which included a stone protocol CT. The patient was on Xarelto as a blood thinner at that point. He does have known significant right lower-pole stone burden. We felt that his hematuria was probably on the basis of the stone along with his blood thinner. The hematuria subsequently resolved and he has been taken off his Xarelto. He was doing well clinically but developed recurrent gross hematuria over the last 1-2 weeks. He has had some intermittent twinges of pain on both sides, but no real significant renal colic. His overall voiding has been unchanged  and he has had no other clinical change.   Past Medical History Problems  1. History of Arthritis (V13.4) 2. History of Atrial fibrillation (427.31) 3. History of diabetes mellitus (V12.29) 4. History of hypertension (V12.59)  Surgical History Problems  1. History of Cystoscopy With Insertion Of Ureteral Stent Left 2. History of Cystoscopy With Ureteroscopy With Lithotripsy 3. History of Kidney Surgery Laparoscopic Partial Nephrectomy 4. History of Knee Surgery 5. History of Procedures Of The Spine 6. History of Tonsillectomy  Current Meds 1. Adult Aspirin Low Strength 81 MG CHEW;  Therapy: (Recorded:03Feb2015) to Recorded 2. Diovan HCT 160-12.5 MG Oral Tablet;  Therapy: [id number] to Recorded 3. Doxazosin Mesylate 8 MG Oral Tablet;  Therapy: [id number] to Recorded 4. Hydrocodone-Acetaminophen 7.5-325 MG Oral Tablet; TAKE 1 TABLET EVERY 4 TO 6  HOURS AS NEEDED;  Therapy: [id number] to (Evaluate:25Mar2015); Last Rx:20Mar2015 Ordered 5. MetFORMIN HCl ER 500 MG Oral Tablet Extended Release 24 Hour;  Therapy: [id number] to Recorded 6. Metoprolol Tartrate 25 MG Oral Tablet;  Therapy: [id number] to Recorded 7. Nateglinide 120 MG Oral Tablet;  Therapy: (Recorded:03Feb2015) to Recorded 8. Parlodel 2.5 MG TABS;  Therapy: (Recorded:12Jun2014) to Recorded 9. Pravastatin Sodium 40 MG Oral Tablet;  Therapy: [id number] to Recorded 10. Tamsulosin HCl - 0.4 MG Oral Capsule;   Therapy: (Recorded:03Feb2015) to Recorded 11. Victoza SOLN;   Therapy: (Recorded:22Feb2013) to Recorded  Allergies Medication  1. No Known Drug Allergies  Family History Problems  1. Family history of diabetes mellitus (V18.0) : Mother, Father 2. Family history of hypertension (V17.49) : Mother  3. Denied: Family history of Kidney Cancer  Social History Problems  1. Alcohol Use   2 beer a month 2. Former Smoker   nonsmoker for the past 20-54yrs 3. Marital History - Currently Married 4. Occupation:    Film/video editor  Review of Systems Genitourinary, constitutional, skin, eye, otolaryngeal, hematologic/lymphatic, cardiovascular, pulmonary, endocrine, musculoskeletal, gastrointestinal, neurological and psychiatric system(s) were reviewed and pertinent findings if present are noted.  Genitourinary: feelings of urinary urgency, weak urinary stream and hematuria, but no dysuria and urinary stream does not start and stop.  Gastrointestinal: flank pain and heartburn.  Integumentary: pruritus.  Cardiovascular: leg swelling.  Musculoskeletal: back pain and joint pain.    Vitals Vital Signs [Data Includes: Last 1 Day]  Recorded: 14Apr2015 08:26AM  Blood Pressure: 149 / 77 Temperature: 98.6 F Heart Rate: 70  Physical Exam Constitutional: Well nourished and well developed . No acute distress.  Neck: The appearance of the neck is normal and no neck mass is present.  Pulmonary: No respiratory distress and normal respiratory rhythm and effort.  Cardiovascular: Heart rate and rhythm are normal . No peripheral edema.  Abdomen: The abdomen is soft and nontender. No masses are palpated. No CVA tenderness. No hernias are palpable. No hepatosplenomegaly noted.  Skin: Normal skin turgor, no visible rash and no visible skin lesions.  Neuro/Psych:. Mood and affect are appropriate.    Results/Data Urine [Data Includes: Last 1 Day]   [id number] COLOR AMBER  APPEARANCE CLOUDY  SPECIFIC GRAVITY 1.020  pH 5.5  GLUCOSE 100 mg/dL BILIRUBIN NEG  KETONE NEG mg/dL BLOOD LARGE  PROTEIN TRACE mg/dL UROBILINOGEN 0.2 mg/dL NITRITE NEG  LEUKOCYTE ESTERASE TRACE  SQUAMOUS EPITHELIAL/HPF FEW  WBC 3-6 WBC/hpf RBC TNTC RBC/hpf BACTERIA FEW  CRYSTALS NONE SEEN  CASTS NONE SEEN  Selected Results  AU CT-HEMATURIA PROTOCOL [id number] 12:00AM Matthew Schmitt  Test Name Result Flag Reference AU CT-HEMATURIA PROTOCOL (Report)   ** RADIOLOGY REPORT BY [facility] **   CLINICAL DATA: Microscopic and gross  hematuria. Right lower quadrant and right-sided back pain. Nausea and vomiting. History of renal stones. Priors dense and cystoscopy.  EXAM: CT ABDOMEN AND PELVIS WITHOUT AND WITH CONTRAST  TECHNIQUE: Multidetector CT imaging of the abdomen and pelvis was performed without contrast material in one or both body regions, followed by contrast material(s) and further sections in one or both body regions.  CONTRAST: 125 cc Isovue-300  COMPARISON: CT UROGRAM dated 09/11/2013; CT UROGRAM dated 04/24/2013; MR ABDOMEN WO/W CM dated 05/06/2013  FINDINGS: Lung Bases: Normal heart size without pericardial or pleural effusion.  Kidneys/Ureters/Bladder: Multiple right greater than left renal calculi. The largest is in the right lower pole collecting system and measures 2.1 cm. No hydroureter or ureteric calculi. No bladder calculi. Scarring within the anterior interpolar left kidney. Low-density bilateral renal lesions. The largest left-sided lesion measures 1.4 cm. Other lesions are too small to characterize.  Moderate right greater than left renal collecting system opacification. Ureters well opacified, without filling defect. Bladder moderately well opacified without filling defect. No enhancing bladder lesion.  Subtle left sided perirenal and periureteric interstitial thickening is identified. This was similar to slightly greater on 09/11/2013.  Other: Hepatic steatosis with marked hepatomegaly. Greater than 25 cm craniocaudal. Normal spleen, pancreas. Mild proximal gastric underdistention. Cholelithiasis without acute cholecystitis or biliary ductal dilatation.  Normal right adrenal gland. Mild left adrenal thickening and nodularity medially. This is chronic. Aortic and branch vessel atherosclerosis. No retroperitoneal or retrocrural adenopathy. Scattered colonic diverticula. Normal terminal ileum  and appendix. Normal small bowel without abdominal ascites. A fat containing  left inguinal hernia. No pelvic adenopathy. Mild prominence of the median lobe of the prostate, indenting the urinary bladder on image 85/series 3. No significant free fluid.  Bones/Musculoskeletal: Benign similar right iliac cystic lesion.  IMPRESSION: 1. Right greater than left renal collecting system calculi. 2. No other explanation for hematuria. 3. Similar to slight decrease in left perirenal and periureteric interstitial thickening. This is nonspecific. Possibly related to prior instrumentation. 4. Too small to characterize renal lesions with a cyst or complex cyst in the upper pole left kidney. This lesion demonstrates no enhancement on 05/06/2013 MRI. 5. Hepatic steatosis and hepatomegaly. 6. Cholelithiasis. 7. Mild prostatomegaly, as evidenced by enlargement of the median lobe.   Electronically Signed  By: Matthew Schmitt M.D.  On: 02/05/2014 13:31  Assessment Assessed  1. Nephrolithiasis (592.0) 2. Renal oncocytoma, unspecified laterality (223.0) 3. Benign prostatic hyperplasia with urinary obstruction (600.01,599.69)  Plan Health Maintenance  1. UA With REFLEX; [Do Not Release]; Status:Complete;   Done: [id number] 08:04AM  Discussion/Summary   Mr Gongaware has a large right lower-pole calculus. He has continued to have intermittent gross hematuria. Urinalysis today continues to show a large amount of blood with too numerous to count red blood cells. Presumptively this is secondary to this large stone, given his otherwise negative assessment. He also continues to have intermittent flank pain. While this stone is not grossly obstructing, it certainly may be obstructing the lower pole calyces. I cannot be 005% certain that this stone is responsible for his pain, but it is likely. At this point I do recommend treatment of the stone. Given the 2.5 cm size, I do recommend percutaneous nephrolithotomy rather than ESWL. On CT imaging, the Hounsfield units are around 700-800,  suggesting probably a good component of urinary acid, but is clearly radiopaque and I do not think alkalinization is likely to dissolve the stone. We did discuss the percutaneous procedure, pros and cons of that approach. He has a busy schedule and will probably put this off until June, which is certainly reasonable.

## 2014-05-14 NOTE — Telephone Encounter (Signed)
Increase the Levemir dose to 36 units daily, will need to followup with Dr. Loanne Drilling next week

## 2014-05-17 ENCOUNTER — Encounter (HOSPITAL_COMMUNITY): Payer: Self-pay | Admitting: *Deleted

## 2014-05-17 ENCOUNTER — Encounter (HOSPITAL_COMMUNITY)
Admission: RE | Disposition: A | Discharge status: Home / Self Care | Payer: Self-pay | Source: Ambulatory Visit | Attending: Urology

## 2014-05-17 ENCOUNTER — Ambulatory Visit (HOSPITAL_COMMUNITY)
Admission: RE | Admit: 2014-05-17 | Discharge: 2014-05-17 | Disposition: A | Discharge status: Home / Self Care | Payer: BC Managed Care – PPO | Source: Ambulatory Visit | Attending: Urology | Admitting: Urology

## 2014-05-17 ENCOUNTER — Ambulatory Visit (HOSPITAL_COMMUNITY): Payer: BC Managed Care – PPO | Admitting: Certified Registered Nurse Anesthetist

## 2014-05-17 ENCOUNTER — Encounter (HOSPITAL_COMMUNITY): Payer: Self-pay | Admitting: Certified Registered Nurse Anesthetist

## 2014-05-17 ENCOUNTER — Observation Stay (HOSPITAL_COMMUNITY)
Admission: RE | Admit: 2014-05-17 | Discharge: 2014-05-18 | Disposition: A | Discharge status: Home / Self Care | Payer: BC Managed Care – PPO | Source: Ambulatory Visit | Attending: Urology | Admitting: Urology

## 2014-05-17 ENCOUNTER — Ambulatory Visit (HOSPITAL_COMMUNITY): Payer: BC Managed Care – PPO

## 2014-05-17 ENCOUNTER — Encounter (HOSPITAL_COMMUNITY): Payer: Self-pay

## 2014-05-17 ENCOUNTER — Encounter (HOSPITAL_COMMUNITY): Payer: BC Managed Care – PPO | Admitting: Certified Registered Nurse Anesthetist

## 2014-05-17 VITALS — BP 141/64 | HR 64 | Temp 98.1°F | Resp 16

## 2014-05-17 DIAGNOSIS — E119 Type 2 diabetes mellitus without complications: Secondary | ICD-10-CM | POA: Insufficient documentation

## 2014-05-17 DIAGNOSIS — Z7982 Long term (current) use of aspirin: Secondary | ICD-10-CM | POA: Insufficient documentation

## 2014-05-17 DIAGNOSIS — N200 Calculus of kidney: Secondary | ICD-10-CM

## 2014-05-17 DIAGNOSIS — I4891 Unspecified atrial fibrillation: Secondary | ICD-10-CM | POA: Insufficient documentation

## 2014-05-17 DIAGNOSIS — I4510 Unspecified right bundle-branch block: Secondary | ICD-10-CM | POA: Insufficient documentation

## 2014-05-17 DIAGNOSIS — N401 Enlarged prostate with lower urinary tract symptoms: Secondary | ICD-10-CM | POA: Insufficient documentation

## 2014-05-17 DIAGNOSIS — Z87891 Personal history of nicotine dependence: Secondary | ICD-10-CM | POA: Insufficient documentation

## 2014-05-17 DIAGNOSIS — I359 Nonrheumatic aortic valve disorder, unspecified: Secondary | ICD-10-CM | POA: Insufficient documentation

## 2014-05-17 DIAGNOSIS — Z79899 Other long term (current) drug therapy: Secondary | ICD-10-CM | POA: Insufficient documentation

## 2014-05-17 DIAGNOSIS — I10 Essential (primary) hypertension: Secondary | ICD-10-CM | POA: Insufficient documentation

## 2014-05-17 DIAGNOSIS — Z905 Acquired absence of kidney: Secondary | ICD-10-CM | POA: Insufficient documentation

## 2014-05-17 DIAGNOSIS — N139 Obstructive and reflux uropathy, unspecified: Secondary | ICD-10-CM | POA: Insufficient documentation

## 2014-05-17 DIAGNOSIS — I2510 Atherosclerotic heart disease of native coronary artery without angina pectoris: Secondary | ICD-10-CM | POA: Insufficient documentation

## 2014-05-17 HISTORY — PX: NEPHROLITHOTOMY: SHX5134

## 2014-05-17 HISTORY — DX: Calculus of kidney: N20.0

## 2014-05-17 LAB — TYPE AND SCREEN
ABO/RH(D): A POS
Antibody Screen: NEGATIVE

## 2014-05-17 LAB — GLUCOSE, CAPILLARY
Glucose-Capillary: 261 mg/dL — ABNORMAL HIGH (ref 70–99)
Glucose-Capillary: 196 mg/dL — ABNORMAL HIGH (ref 70–99)
Glucose-Capillary: 237 mg/dL — ABNORMAL HIGH (ref 70–99)
Glucose-Capillary: 254 mg/dL — ABNORMAL HIGH (ref 70–99)
Glucose-Capillary: 255 mg/dL — ABNORMAL HIGH (ref 70–99)
Glucose-Capillary: 215 mg/dL — ABNORMAL HIGH (ref 70–99)

## 2014-05-17 LAB — CBC WITH DIFFERENTIAL/PLATELET
Basophils Absolute: 0 10*3/uL (ref 0.0–0.1)
Basophils Relative: 1 % (ref 0–1)
Eosinophils Absolute: 0.1 10*3/uL (ref 0.0–0.7)
Eosinophils Relative: 2 % (ref 0–5)
HCT: 38.4 % — ABNORMAL LOW (ref 39.0–52.0)
Hemoglobin: 13.4 g/dL (ref 13.0–17.0)
Lymphocytes Relative: 25 % (ref 12–46)
Lymphs Abs: 1 10*3/uL (ref 0.7–4.0)
MCH: 28.2 pg (ref 26.0–34.0)
MCHC: 34.9 g/dL (ref 30.0–36.0)
MCV: 80.8 fL (ref 78.0–100.0)
Monocytes Absolute: 0.4 10*3/uL (ref 0.1–1.0)
Monocytes Relative: 9 % (ref 3–12)
Neutro Abs: 2.5 10*3/uL (ref 1.7–7.7)
Neutrophils Relative %: 63 % (ref 43–77)
Platelets: 155 10*3/uL (ref 150–400)
RBC: 4.75 MIL/uL (ref 4.22–5.81)
RDW: 13.1 % (ref 11.5–15.5)
WBC: 3.9 10*3/uL — ABNORMAL LOW (ref 4.0–10.5)

## 2014-05-17 LAB — BASIC METABOLIC PANEL
BUN: 25 mg/dL — ABNORMAL HIGH (ref 6–23)
CO2: 22 mEq/L (ref 19–32)
Calcium: 8.8 mg/dL (ref 8.4–10.5)
Chloride: 100 mEq/L (ref 96–112)
Creatinine, Ser: 0.84 mg/dL (ref 0.50–1.35)
GFR calc Af Amer: >90 mL/min (ref >90)
GFR calc non Af Amer: >90 mL/min (ref >90)
Glucose, Bld: 292 mg/dL — ABNORMAL HIGH (ref 70–99)
Potassium: 3.8 mEq/L (ref 3.7–5.3)
Sodium: 136 mEq/L — ABNORMAL LOW (ref 137–147)

## 2014-05-17 LAB — PROTIME-INR
INR: 1.05 (ref 0.00–1.49)
Prothrombin Time: 13.7 seconds (ref 11.6–15.2)

## 2014-05-17 SURGERY — NEPHROLITHOTOMY PERCUTANEOUS
Anesthesia: General | Laterality: Right

## 2014-05-17 MED ORDER — NATEGLINIDE 120 MG PO TABS: 120.0000 mg | ORAL_TABLET | Freq: Two times a day (BID) | ORAL | Status: DC

## 2014-05-17 MED ORDER — IOHEXOL 300 MG/ML  SOLN
25.0000 mL | Freq: Once | INTRAMUSCULAR | Status: AC | PRN
  Administered 2014-05-17: 25 mL

## 2014-05-17 MED ORDER — SUCCINYLCHOLINE CHLORIDE 20 MG/ML IJ SOLN
INTRAMUSCULAR | Status: DC | PRN
  Administered 2014-05-17: 100 mg via INTRAVENOUS
  Administered 2014-05-17: 60 mg via INTRAVENOUS

## 2014-05-17 MED ORDER — FENTANYL CITRATE 0.05 MG/ML IJ SOLN
25.0000 ug | INTRAMUSCULAR | Status: DC | PRN
  Administered 2014-05-17: 50 ug via INTRAVENOUS

## 2014-05-17 MED ORDER — OXYBUTYNIN CHLORIDE 5 MG PO TABS
5.0000 mg | ORAL_TABLET | Freq: Three times a day (TID) | ORAL | Status: DC | PRN
  Administered 2014-05-17 – 2014-05-18 (×2): 5 mg via ORAL
  Filled 2014-05-17 (×2): qty 1

## 2014-05-17 MED ORDER — CIPROFLOXACIN IN D5W 400 MG/200ML IV SOLN
400.0000 mg | INTRAVENOUS | Status: AC
  Administered 2014-05-17: 400 mg via INTRAVENOUS

## 2014-05-17 MED ORDER — SODIUM CHLORIDE 0.9 % IV SOLN
INTRAVENOUS | Status: DC
  Administered 2014-05-17: 08:00:00 via INTRAVENOUS

## 2014-05-17 MED ORDER — LIDOCAINE HCL (CARDIAC) 20 MG/ML IV SOLN
INTRAVENOUS | Status: DC | PRN
  Administered 2014-05-17: 100 mg via INTRAVENOUS

## 2014-05-17 MED ORDER — PHENYLEPHRINE HCL 10 MG/ML IJ SOLN
INTRAMUSCULAR | Status: DC | PRN
  Administered 2014-05-17: 80 ug via INTRAVENOUS

## 2014-05-17 MED ORDER — ONDANSETRON HCL 4 MG/2ML IJ SOLN
INTRAMUSCULAR | Status: DC | PRN
  Administered 2014-05-17: 4 mg via INTRAVENOUS

## 2014-05-17 MED ORDER — IOHEXOL 300 MG/ML  SOLN
INTRAMUSCULAR | Status: DC | PRN
  Administered 2014-05-17: 20 mL via INTRAVENOUS

## 2014-05-17 MED ORDER — MIDAZOLAM HCL 2 MG/2ML IJ SOLN
INTRAMUSCULAR | Status: AC
  Filled 2014-05-17: qty 6

## 2014-05-17 MED ORDER — PROPOFOL 10 MG/ML IV BOLUS
INTRAVENOUS | Status: DC | PRN
  Administered 2014-05-17: 100 mg via INTRAVENOUS
  Administered 2014-05-17: 200 mg via INTRAVENOUS

## 2014-05-17 MED ORDER — ONDANSETRON HCL 4 MG/2ML IJ SOLN: 4.0000 mg | INTRAMUSCULAR | Status: DC | PRN

## 2014-05-17 MED ORDER — FENTANYL CITRATE 0.05 MG/ML IJ SOLN
INTRAMUSCULAR | Status: AC
  Filled 2014-05-17: qty 6

## 2014-05-17 MED ORDER — FENTANYL CITRATE 0.05 MG/ML IJ SOLN
INTRAMUSCULAR | Status: AC | PRN
Start: 2014-05-17 — End: 2014-05-17
  Administered 2014-05-17 (×2): 50 ug via INTRAVENOUS

## 2014-05-17 MED ORDER — PROMETHAZINE HCL 25 MG/ML IJ SOLN: 6.2500 mg | INTRAMUSCULAR | Status: DC | PRN

## 2014-05-17 MED ORDER — IRBESARTAN 300 MG PO TABS
300.0000 mg | ORAL_TABLET | Freq: Every day | ORAL | Status: DC
  Administered 2014-05-17 – 2014-05-18 (×2): 300 mg via ORAL
  Filled 2014-05-17 (×2): qty 1

## 2014-05-17 MED ORDER — DOXAZOSIN MESYLATE 8 MG PO TABS
8.0000 mg | ORAL_TABLET | Freq: Every day | ORAL | Status: DC
  Administered 2014-05-17: 8 mg via ORAL
  Filled 2014-05-17 (×2): qty 1

## 2014-05-17 MED ORDER — VALSARTAN-HYDROCHLOROTHIAZIDE 320-25 MG PO TABS: 1.0000 | ORAL_TABLET | Freq: Every day | ORAL | Status: DC

## 2014-05-17 MED ORDER — METOPROLOL TARTRATE 25 MG PO TABS
25.0000 mg | ORAL_TABLET | Freq: Two times a day (BID) | ORAL | Status: DC
  Administered 2014-05-17 – 2014-05-18 (×2): 25 mg via ORAL
  Filled 2014-05-17 (×3): qty 1

## 2014-05-17 MED ORDER — MORPHINE SULFATE 2 MG/ML IJ SOLN
2.0000 mg | INTRAMUSCULAR | Status: DC | PRN
  Administered 2014-05-17 – 2014-05-18 (×2): 2 mg via INTRAVENOUS
  Filled 2014-05-17 (×2): qty 1

## 2014-05-17 MED ORDER — METFORMIN HCL ER 750 MG PO TB24: 2000.0000 mg | ORAL_TABLET | Freq: Every day | ORAL | Status: DC

## 2014-05-17 MED ORDER — NEOSTIGMINE METHYLSULFATE 10 MG/10ML IV SOLN
INTRAVENOUS | Status: DC | PRN
  Administered 2014-05-17: 4 mg via INTRAVENOUS

## 2014-05-17 MED ORDER — FENTANYL CITRATE 0.05 MG/ML IJ SOLN
INTRAMUSCULAR | Status: AC
  Filled 2014-05-17: qty 2

## 2014-05-17 MED ORDER — FENTANYL CITRATE 0.05 MG/ML IJ SOLN
INTRAMUSCULAR | Status: DC | PRN
  Administered 2014-05-17 (×3): 50 ug via INTRAVENOUS

## 2014-05-17 MED ORDER — TAMSULOSIN HCL 0.4 MG PO CAPS: 0.4000 mg | ORAL_CAPSULE | ORAL | Status: DC

## 2014-05-17 MED ORDER — HYDROCODONE-ACETAMINOPHEN 7.5-325 MG PO TABS
1.0000 | ORAL_TABLET | Freq: Four times a day (QID) | ORAL | Status: DC | PRN
  Administered 2014-05-17 – 2014-05-18 (×2): 1 via ORAL
  Filled 2014-05-17 (×2): qty 1

## 2014-05-17 MED ORDER — INSULIN DETEMIR 100 UNIT/ML ~~LOC~~ SOLN
36.0000 [IU] | Freq: Every day | SUBCUTANEOUS | Status: DC
  Administered 2014-05-17: 36 [IU] via SUBCUTANEOUS
  Filled 2014-05-17: qty 0.36

## 2014-05-17 MED ORDER — MEPERIDINE HCL 50 MG/ML IJ SOLN: 6.2500 mg | INTRAMUSCULAR | Status: DC | PRN

## 2014-05-17 MED ORDER — HYDROCHLOROTHIAZIDE 25 MG PO TABS
25.0000 mg | ORAL_TABLET | Freq: Every day | ORAL | Status: DC
  Administered 2014-05-17 – 2014-05-18 (×2): 25 mg via ORAL
  Filled 2014-05-17 (×2): qty 1

## 2014-05-17 MED ORDER — DEXTROSE 5 % IV SOLN
10.0000 mg | INTRAVENOUS | Status: DC | PRN
  Administered 2014-05-17: 50 ug/min via INTRAVENOUS

## 2014-05-17 MED ORDER — BROMOCRIPTINE MESYLATE 2.5 MG PO TABS: 2.5000 mg | ORAL_TABLET | Freq: Every day | ORAL | Status: DC

## 2014-05-17 MED ORDER — CIPROFLOXACIN IN D5W 400 MG/200ML IV SOLN: 400.0000 mg | INTRAVENOUS | Status: DC

## 2014-05-17 MED ORDER — LACTATED RINGERS IV SOLN
INTRAVENOUS | Status: DC
Start: 2014-05-17 — End: 2014-05-17
  Administered 2014-05-17: 13:00:00 via INTRAVENOUS
  Administered 2014-05-17: 1000 mL via INTRAVENOUS

## 2014-05-17 MED ORDER — AMLODIPINE BESYLATE 5 MG PO TABS
5.0000 mg | ORAL_TABLET | Freq: Every day | ORAL | Status: DC
  Administered 2014-05-17 – 2014-05-18 (×2): 5 mg via ORAL
  Filled 2014-05-17 (×2): qty 1

## 2014-05-17 MED ORDER — KCL IN DEXTROSE-NACL 20-5-0.45 MEQ/L-%-% IV SOLN
INTRAVENOUS | Status: DC
  Administered 2014-05-17 – 2014-05-18 (×2): via INTRAVENOUS
  Filled 2014-05-17 (×3): qty 1000

## 2014-05-17 MED ORDER — INSULIN ASPART 100 UNIT/ML ~~LOC~~ SOLN
SUBCUTANEOUS | Status: AC
  Filled 2014-05-17: qty 1

## 2014-05-17 MED ORDER — INSULIN ASPART 100 UNIT/ML ~~LOC~~ SOLN
0.0000 [IU] | SUBCUTANEOUS | Status: DC
Start: 2014-05-17 — End: 2014-05-17
  Administered 2014-05-17: 5 [IU] via SUBCUTANEOUS

## 2014-05-17 MED ORDER — GLYCOPYRROLATE 0.2 MG/ML IJ SOLN
INTRAMUSCULAR | Status: DC | PRN
  Administered 2014-05-17: 0.6 mg via INTRAVENOUS

## 2014-05-17 MED ORDER — CIPROFLOXACIN IN D5W 400 MG/200ML IV SOLN
INTRAVENOUS | Status: AC
  Filled 2014-05-17: qty 200

## 2014-05-17 MED ORDER — INSULIN ASPART 100 UNIT/ML ~~LOC~~ SOLN
0.0000 [IU] | Freq: Three times a day (TID) | SUBCUTANEOUS | Status: DC
  Administered 2014-05-17 – 2014-05-18 (×2): 8 [IU] via SUBCUTANEOUS
  Administered 2014-05-18: 3 [IU] via SUBCUTANEOUS

## 2014-05-17 MED ORDER — LACTATED RINGERS IV SOLN: INTRAVENOUS | Status: DC

## 2014-05-17 MED ORDER — SIMVASTATIN 20 MG PO TABS
20.0000 mg | ORAL_TABLET | Freq: Every day | ORAL | Status: DC
  Administered 2014-05-17: 20 mg via ORAL
  Filled 2014-05-17 (×2): qty 1

## 2014-05-17 MED ORDER — SODIUM CHLORIDE 0.9 % IR SOLN
Status: DC | PRN
  Administered 2014-05-17: 12000 mL

## 2014-05-17 MED ORDER — ROCURONIUM BROMIDE 100 MG/10ML IV SOLN
INTRAVENOUS | Status: DC | PRN
  Administered 2014-05-17 (×2): 10 mg via INTRAVENOUS
  Administered 2014-05-17: 40 mg via INTRAVENOUS

## 2014-05-17 MED ORDER — MIDAZOLAM HCL 2 MG/2ML IJ SOLN
INTRAMUSCULAR | Status: AC | PRN
  Administered 2014-05-17: 0.5 mg via INTRAVENOUS
  Administered 2014-05-17 (×3): 1 mg via INTRAVENOUS
  Administered 2014-05-17: 0.5 mg via INTRAVENOUS

## 2014-05-17 MED ORDER — 0.9 % SODIUM CHLORIDE (POUR BTL) OPTIME
TOPICAL | Status: DC | PRN
  Administered 2014-05-17: 1000 mL

## 2014-05-17 MED ORDER — LIDOCAINE HCL 1 % IJ SOLN
INTRAMUSCULAR | Status: AC
  Filled 2014-05-17: qty 20

## 2014-05-17 MED ORDER — CIPROFLOXACIN IN D5W 400 MG/200ML IV SOLN
400.0000 mg | Freq: Two times a day (BID) | INTRAVENOUS | Status: AC
  Administered 2014-05-17: 400 mg via INTRAVENOUS
  Filled 2014-05-17: qty 200

## 2014-05-17 SURGICAL SUPPLY — 49 items
APL SKNCLS STERI-STRIP NONHPOA (GAUZE/BANDAGES/DRESSINGS) ×4
BAG URINE DRAINAGE (UROLOGICAL SUPPLIES) ×3 IMPLANT
BAG URO CATCHER STRL LF (DRAPE) ×1 IMPLANT
BASKET ZERO TIP NITINOL 2.4FR (BASKET) ×3 IMPLANT
BENZOIN TINCTURE PRP APPL 2/3 (GAUZE/BANDAGES/DRESSINGS) ×6 IMPLANT
BSKT STON RTRVL ZERO TP 2.4FR (BASKET) ×2
CATCHER STONE W/TUBE ADAPTER (UROLOGICAL SUPPLIES) ×1 IMPLANT
CATH BEACON 5.038 65CM KMP-01 (CATHETERS) ×2 IMPLANT
CATH FOLEY 2W COUNCIL 20FR 5CC (CATHETERS) ×2 IMPLANT
CATH ROBINSON RED A/P 20FR (CATHETERS) IMPLANT
CATH X-FORCE N30 NEPHROSTOMY (TUBING) ×3 IMPLANT
COVER SURGICAL LIGHT HANDLE (MISCELLANEOUS) ×3 IMPLANT
DRAPE C-ARM 42X120 X-RAY (DRAPES) ×3 IMPLANT
DRAPE CAMERA CLOSED 9X96 (DRAPES) ×3 IMPLANT
DRAPE LINGEMAN PERC (DRAPES) ×3 IMPLANT
DRAPE SURG IRRIG POUCH 19X23 (DRAPES) ×3 IMPLANT
DRSG PAD ABDOMINAL 8X10 ST (GAUZE/BANDAGES/DRESSINGS) ×4 IMPLANT
DRSG TEGADERM 8X12 (GAUZE/BANDAGES/DRESSINGS) ×4 IMPLANT
FIBER LASER FLEXIVA 1000 (UROLOGICAL SUPPLIES) IMPLANT
FIBER LASER FLEXIVA 550 (UROLOGICAL SUPPLIES) IMPLANT
GAUZE SPONGE 4X4 12PLY STRL (GAUZE/BANDAGES/DRESSINGS) ×3 IMPLANT
GAUZE SPONGE 4X4 16PLY XRAY LF (GAUZE/BANDAGES/DRESSINGS) ×2 IMPLANT
GLOVE BIOGEL M STRL SZ7.5 (GLOVE) ×3 IMPLANT
GOWN STRL REUS W/TWL XL LVL3 (GOWN DISPOSABLE) ×6 IMPLANT
GUIDEWIRE AMPLAZ .035X145 (WIRE) ×4 IMPLANT
KIT BASIN OR (CUSTOM PROCEDURE TRAY) ×3 IMPLANT
MANIFOLD NEPTUNE II (INSTRUMENTS) ×3 IMPLANT
NS IRRIG 1000ML POUR BTL (IV SOLUTION) ×1 IMPLANT
PACK BASIC VI WITH GOWN DISP (CUSTOM PROCEDURE TRAY) ×3 IMPLANT
PACK CYSTO (CUSTOM PROCEDURE TRAY) ×3 IMPLANT
POSITIONER SURGICAL ARM (MISCELLANEOUS) ×6 IMPLANT
PROBE LITHOCLAST ULTRA 3.8X403 (UROLOGICAL SUPPLIES) ×2 IMPLANT
PROBE PNEUMATIC 1.0MMX570MM (UROLOGICAL SUPPLIES) ×3 IMPLANT
SET IRRIG Y TYPE TUR BLADDER L (SET/KITS/TRAYS/PACK) ×3 IMPLANT
SET WARMING FLUID IRRIGATION (MISCELLANEOUS) ×1 IMPLANT
SHEATH PEELAWAY SET 9 (SHEATH) ×2 IMPLANT
SPONGE GAUZE 4X4 12PLY (GAUZE/BANDAGES/DRESSINGS) ×2 IMPLANT
SPONGE LAP 4X18 X RAY DECT (DISPOSABLE) ×1 IMPLANT
STONE CATCHER W/TUBE ADAPTER (UROLOGICAL SUPPLIES) ×3 IMPLANT
SUT SILK 2 0 30  PSL (SUTURE) ×1
SUT SILK 2 0 30 PSL (SUTURE) ×2 IMPLANT
SYRINGE 12CC LL (MISCELLANEOUS) ×3 IMPLANT
SYRINGE 20CC LL (MISCELLANEOUS) ×6 IMPLANT
TOWEL OR 17X26 10 PK STRL BLUE (TOWEL DISPOSABLE) ×3 IMPLANT
TOWEL OR NON WOVEN STRL DISP B (DISPOSABLE) ×3 IMPLANT
TRAY FOLEY CATH 14FRSI W/METER (CATHETERS) ×1 IMPLANT
TRAY FOLEY CATH 16FRSI W/METER (SET/KITS/TRAYS/PACK) ×2 IMPLANT
TUBING CONNECTING 10 (TUBING) ×9 IMPLANT
WATER STERILE IRR 1500ML POUR (IV SOLUTION) ×1 IMPLANT

## 2014-05-17 NOTE — Transfer of Care (Signed)
Immediate Anesthesia Transfer of Care Note  Patient: Matthew Schmitt  Procedure(s) Performed: Procedure(s): NEPHROLITHOTOMY PERCUTANEOUS (Right)  Patient Location: PACU  Anesthesia Type:General  Level of Consciousness: awake, oriented, patient cooperative, lethargic and responds to stimulation  Airway & Oxygen Therapy: Patient Spontanous Breathing and Patient connected to face mask oxygen  Post-op Assessment: Report given to PACU RN, Post -op Vital signs reviewed and stable and Patient moving all extremities  Post vital signs: Reviewed and stable  Complications: No apparent anesthesia complications

## 2014-05-17 NOTE — Anesthesia Preprocedure Evaluation (Signed)
Anesthesia Evaluation  Patient identified by MRN, date of birth, ID band Patient awake    Reviewed: Allergy & Precautions, H&P , NPO status , Patient's Chart, lab work & pertinent test results  Airway Mallampati: II TM Distance: >3 FB Neck ROM: Full    Dental no notable dental hx.    Pulmonary neg pulmonary ROS, former smoker,  breath sounds clear to auscultation  Pulmonary exam normal       Cardiovascular hypertension, Pt. on medications + angina with exertion + CAD + dysrhythmias Atrial Fibrillation + Valvular Problems/Murmurs (moderate AI) AI Rhythm:Regular Rate:Normal  rbbb   Neuro/Psych negative neurological ROS  negative psych ROS   GI/Hepatic negative GI ROS, Neg liver ROS,   Endo/Other  diabetes, Type 2, Oral Hypoglycemic Agents  Renal/GU negative Renal ROS  negative genitourinary   Musculoskeletal negative musculoskeletal ROS (+)   Abdominal   Peds negative pediatric ROS (+)  Hematology negative hematology ROS (+)   Anesthesia Other Findings   Reproductive/Obstetrics negative OB ROS                           Anesthesia Physical  Anesthesia Plan  ASA: III  Anesthesia Plan: General   Post-op Pain Management:    Induction: Intravenous  Airway Management Planned: Oral ETT  Additional Equipment: Arterial line  Intra-op Plan:   Post-operative Plan: Extubation in OR  Informed Consent: I have reviewed the patients History and Physical, chart, labs and discussed the procedure including the risks, benefits and alternatives for the proposed anesthesia with the patient or authorized representative who has indicated his/her understanding and acceptance.   Dental advisory given  Plan Discussed with: CRNA  Anesthesia Plan Comments:         Anesthesia Quick Evaluation

## 2014-05-17 NOTE — Anesthesia Postprocedure Evaluation (Signed)
  Anesthesia Post-op Note  Patient: Matthew Schmitt  Procedure(s) Performed: Procedure(s) (LRB): NEPHROLITHOTOMY PERCUTANEOUS (Right)  Patient Location: PACU  Anesthesia Type: General  Level of Consciousness: awake and alert   Airway and Oxygen Therapy: Patient Spontanous Breathing  Post-op Pain: mild  Post-op Assessment: Post-op Vital signs reviewed, Patient's Cardiovascular Status Stable, Respiratory Function Stable, Patent Airway and No signs of Nausea or vomiting  Last Vitals:  Filed Vitals:   05/17/14 1532  BP: 166/86  Pulse: 54  Temp: 36.3 C  Resp: 15    Post-op Vital Signs: stable   Complications: No apparent anesthesia complications

## 2014-05-17 NOTE — Op Note (Signed)
Preoperative diagnosis: 2.5-3.0 cm right renal calculus Postoperative diagnosis: Same  Procedure: Percutaneous nephrolithotomy   Surgeon: Bernestine Amass M.D.  Anesthesia: Gen.  Indications: Mr. Jagiello is 63 years of age. He has a prior history of nephrolithiasis. He was seen several months ago because of ongoing gross hematuria. He has had intermittent right-sided flank and back discomfort. He has a large stone in the lower pole of his right kidney. We discussed the fact that the stone was nonobstructing but may be obstructing a lower pole calyx. It was difficult to say with certainty whether the stone was causing his discomfort. He has had intermittent hematuria. We discussed the pros and cons of elective treatment he decided to go ahead. Given the size of the stone we felt percutaneous procedure offered the best opportunity to resolve the issue. Full informed consent has been obtained.     Technique and findings: The patient had preplacement of a nephrostomy tube in interventional radiology by Dr. Markus Daft . No obvious complications or problems were noted with access. The patient was brought to the operating room. A Foley catheter was placed. The patient had successful induction of general endotracheal anesthesia he was carefully placed in the prone position. All extremities carefully padded. Compression boots were utilized and the patient received perioperative antibiotics. Appropriate surgical timeout was performed. Initial access wasn't again are ready obtained by interventional radiology. Dr. hand was available for the procedure and provided placement of a second safety wire. Access sheath was placed over a 24 French fascial dilating balloon. A large stone was encountered in the lower pole. A combination ultrasound and lithotripter were utilized to fracture the stone into approximately 20-25 pieces. These were then basket extracted with the grasper. At the completion of the procedure I could not see  any significant residual fragments. A 20 [place] council tip Foley was placed as a nephrostomy tube. I also placed a ureteral catheter down to the bladder as a safety catheter in case subsequent double-J stent is necessary. The patient was brought to recovery room stable condition. No obvious complications occurred.

## 2014-05-17 NOTE — H&P (Signed)
Chief Complaint: "I am here for a procedure for my right kidney stones." Referring Physician: Dr. Risa Grill HPI: Matthew Schmitt is an 63 y.o. 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 The patient has known right lower pole nephrolithiasis with ongoing mild right flank aching pain and intermittent gross hematuria. IR received request for image guided percutaneous nephrostomy tube placement today with moderate sedation followed by PCNL today. He denies any chest pain, shortness of breath or palpitations. He denies any active signs of bleeding or excessive bruising. He denies any recent fever or chills. The patient admits to a diagnosis of sleep apnea, but does not have a CPAP. He denies any chronic oxygen use. He has no known complications to sedation.   Past Medical History:  Past Medical History  Diagnosis Date  . Impotence of organic origin   . Mixed hyperlipidemia     slightly  . Essential hypertension, benign   . Type II or unspecified type diabetes mellitus without mention of complication, uncontrolled   . History of kidney stones   . Coronary artery disease   . Anginal pain   . RBBB   . Atrial fibrillation May 2014    "heart went to fast"  . Arthritis     everywhere  . Heart murmur     Past Surgical History:  Past Surgical History  Procedure Laterality Date  . Knee surgery  1978  &  1974    BILATERAL  . Cardiac catheterization  08-17-2011  DR SPENCER TILLEY    POSSIBLE MODERATE LAD STENOSIS JUST AFTER THE BIFURCATION OF LARGE DIAGONAL BRANCH/ LVEF  40-45%/ MILD GLOBAL HYPODINESIS (CATH DONE FOR ABNORMAL STRESS TEST OF FIXED LATERAL WALL DEFECT & BIFASCICULAR BLAOCK)  . Tonsillectomy  54  (age 56)  . Cystoscopy with retrograde pyelogram, ureteroscopy and stent placement  11/03/2012    Procedure: CYSTOSCOPY WITH RETROGRADE PYELOGRAM, URETEROSCOPY AND STENT PLACEMENT;  Surgeon: Bernestine Amass, MD;  Location: [facility];  Service: Urology;   Laterality: Left;  Cystoscopy/Left Retrograde/Ureteroscopy/Holmium Laser Litho/Double J Stent  . Holmium laser application  53/61/4431    Procedure: HOLMIUM LASER APPLICATION;  Surgeon: Bernestine Amass, MD;  Location: [facility];  Service: Urology;  Laterality: Left;  . Cardioversion N/A 04/02/2013    Procedure: CARDIOVERSION;  Surgeon: Jacolyn Reedy, MD;  Location: [facility];  Service: Cardiovascular;  Laterality: N/A;  . Pilonidal cyst excision    . Robotic assited partial nephrectomy Left 06/18/2013    Procedure: ROBOTIC ASSISTED PARTIAL NEPHRECTOMY;  Surgeon: Dutch Gray, MD;  Location: [facility];  Service: Urology;  Laterality: Left;    Family History:  Family History  Problem Relation Age of Onset  . Arthritis Other     Parents  . Hypertension Other     Parents  . Diabetes Other     Parents    Social History:  reports that he quit smoking about 44 years ago. His smoking use included Cigarettes. He has a 6 pack-year smoking history. He has never used smokeless tobacco. He reports that he drinks alcohol. He reports that he does not use illicit drugs.  Allergies:  Allergies  Allergen Reactions  . Other Cough    Blood pressure medication-reaction was about 18 years ago  . Pioglitazone     edema    Medications:   Medication List    Notice   This visit is during an admission. Changes to the med list made in this visit will be reflected  in the After Visit Summary of the admission.     Please HPI for pertinent positives, otherwise complete 10 system ROS negative.  Physical Exam: BP 159/67  Pulse 61  Temp(Src) 98.6 F (37 C) (Oral)  Resp 18  SpO2 98% There is no weight on file to calculate BMI.  General Appearance:  Alert, cooperative, no distress  Head:  Normocephalic, without obvious abnormality, atraumatic  Neck: Supple, symmetrical, trachea midline  Lungs:   Clear to auscultation bilaterally, no w/r/r, respirations unlabored without use of accessory  muscles.  Chest Wall:  No tenderness or deformity  Heart:  Regular rate and rhythm, S1, S2 normal, no murmur, rub or gallop.  Abdomen:   Soft, right CVA tenderness, non distended, (+) BS  Extremities: Extremities normal, atraumatic, no cyanosis or edema  Neurologic: Normal affect, no gross deficits.   Results for orders placed during the hospital encounter of 05/17/14 (from the past 48 hour(s))  BASIC METABOLIC PANEL     Status: Abnormal   Collection Time    05/17/14  8:00 AM      Result Value Ref Range   Sodium 136 (*) 137 - 147 mEq/L   Potassium 3.8  3.7 - 5.3 mEq/L   Chloride 100  96 - 112 mEq/L   CO2 22  19 - 32 mEq/L   Glucose, Bld 292 (*) 70 - 99 mg/dL   BUN 25 (*) 6 - 23 mg/dL   Creatinine, Ser 0.84  0.50 - 1.35 mg/dL   Calcium 8.8  8.4 - 10.5 mg/dL   GFR calc non Af Amer >90  >90 mL/min   GFR calc Af Amer >90  >90 mL/min   Comment: (NOTE)     The eGFR has been calculated using the CKD EPI equation.     This calculation has not been validated in all clinical situations.     eGFR's persistently <90 mL/min signify possible Chronic Kidney     Disease.  CBC WITH DIFFERENTIAL     Status: Abnormal   Collection Time    05/17/14  8:00 AM      Result Value Ref Range   WBC 3.9 (*) 4.0 - 10.5 K/uL   RBC 4.75  4.22 - 5.81 MIL/uL   Hemoglobin 13.4  13.0 - 17.0 g/dL   HCT 38.4 (*) 39.0 - 52.0 %   MCV 80.8  78.0 - 100.0 fL   MCH 28.2  26.0 - 34.0 pg   MCHC 34.9  30.0 - 36.0 g/dL   RDW 13.1  11.5 - 15.5 %   Platelets 155  150 - 400 K/uL   Neutrophils Relative % 63  43 - 77 %   Neutro Abs 2.5  1.7 - 7.7 K/uL   Lymphocytes Relative 25  12 - 46 %   Lymphs Abs 1.0  0.7 - 4.0 K/uL   Monocytes Relative 9  3 - 12 %   Monocytes Absolute 0.4  0.1 - 1.0 K/uL   Eosinophils Relative 2  0 - 5 %   Eosinophils Absolute 0.1  0.0 - 0.7 K/uL   Basophils Relative 1  0 - 1 %   Basophils Absolute 0.0  0.0 - 0.1 K/uL  PROTIME-INR     Status: None   Collection Time    05/17/14  8:00 AM       Result Value Ref Range   Prothrombin Time 13.7  11.6 - 15.2 seconds   INR 1.05  0.00 - 1.49   No results found.  Assessment/Plan History of partial nephrectomy secondary to oncocytoma Right lower pole nephrolithiasis Request for image guided percutaneous nephrostomy tube placement today with moderate sedation followed by PCNL today Patient has been NPO, no blood thinners taken, afebrile, labs and images reviewed. Risks and Benefits discussed with the patient. All of the patient's questions were answered, patient is agreeable to proceed. Consent signed and in chart.   Tsosie Billing D PA-C 05/17/2014, 9:38 AM

## 2014-05-17 NOTE — Interval H&P Note (Signed)
History and Physical Interval Note:  05/17/2014 11:32 AM  Matthew Schmitt  has presented today for surgery, with the diagnosis of RIGHT RENAL CALCULUS  The various methods of treatment have been discussed with the patient and family. After consideration of risks, benefits and other options for treatment, the patient has consented to  Procedure(s): NEPHROLITHOTOMY PERCUTANEOUS (Right) HOLMIUM LASER APPLICATION (N/A) as a surgical intervention .  The patient's history has been reviewed, patient examined, no change in status, stable for surgery.  I have reviewed the patient's chart and labs.  Questions were answered to the patient's satisfaction.     GRAPEY,Smiley S

## 2014-05-17 NOTE — Progress Notes (Signed)
Inpatient Diabetes Program Recommendations  AACE/ADA: New Consensus Statement on Inpatient Glycemic Control (2013)  Target Ranges:  Prepandial:   less than 140 mg/dL      Peak postprandial:   less than 180 mg/dL (1-2 hours)      Critically ill patients:  140 - 180 mg/dL   Reason for Visit: Hyperglycemia  Diabetes history: DM2 Outpatient Diabetes medications: Lantus 36 units QHS Current orders for Inpatient glycemic control: Lantus 36 units QHS  Needs correction insulin. Inpatient Diabetes Program Recommendations Correction (SSI): Add Novolog resistant tidwc and hs HgbA1C: 8.0% needs tighter control   Note: Will continue to follow. Thank you. Lorenda Peck, RD, LDN, CDE Inpatient Diabetes Coordinator [phone]

## 2014-05-17 NOTE — Procedures (Signed)
Placement of right nephroureteral catheter with [place] and fluoro guidance.  Large stone(s) in lower pole.  No immediate complication.  Plan to go to OR for stone removal.

## 2014-05-18 ENCOUNTER — Observation Stay (HOSPITAL_COMMUNITY): Payer: BC Managed Care – PPO

## 2014-05-18 ENCOUNTER — Encounter (HOSPITAL_COMMUNITY): Payer: Self-pay | Admitting: Urology

## 2014-05-18 LAB — BASIC METABOLIC PANEL
BUN: 17 mg/dL (ref 6–23)
CO2: 25 mEq/L (ref 19–32)
Calcium: 8.3 mg/dL — ABNORMAL LOW (ref 8.4–10.5)
Chloride: 100 mEq/L (ref 96–112)
Creatinine, Ser: 0.85 mg/dL (ref 0.50–1.35)
GFR calc Af Amer: >90 mL/min (ref >90)
GFR calc non Af Amer: >90 mL/min (ref >90)
Glucose, Bld: 239 mg/dL — ABNORMAL HIGH (ref 70–99)
Potassium: 3.9 mEq/L (ref 3.7–5.3)
Sodium: 135 mEq/L — ABNORMAL LOW (ref 137–147)

## 2014-05-18 LAB — CBC
HCT: 31.7 % — ABNORMAL LOW (ref 39.0–52.0)
Hemoglobin: 10.8 g/dL — ABNORMAL LOW (ref 13.0–17.0)
MCH: 27.9 pg (ref 26.0–34.0)
MCHC: 34.1 g/dL (ref 30.0–36.0)
MCV: 81.9 fL (ref 78.0–100.0)
Platelets: 127 10*3/uL — ABNORMAL LOW (ref 150–400)
RBC: 3.87 MIL/uL — ABNORMAL LOW (ref 4.22–5.81)
RDW: 13.3 % (ref 11.5–15.5)
WBC: 6.2 10*3/uL (ref 4.0–10.5)

## 2014-05-18 LAB — GLUCOSE, CAPILLARY
Glucose-Capillary: 182 mg/dL — ABNORMAL HIGH (ref 70–99)
Glucose-Capillary: 263 mg/dL — ABNORMAL HIGH (ref 70–99)

## 2014-05-18 MED ORDER — HYDROCODONE-ACETAMINOPHEN 7.5-325 MG PO TABS: 1.0000 | ORAL_TABLET | Freq: Four times a day (QID) | ORAL | Status: DC | PRN

## 2014-05-18 MED ORDER — POTASSIUM CITRATE ER 10 MEQ (1080 MG) PO TBCR: 20.0000 meq | EXTENDED_RELEASE_TABLET | Freq: Two times a day (BID) | ORAL | Status: DC

## 2014-05-18 NOTE — Progress Notes (Signed)
UR completed 

## 2014-05-18 NOTE — Discharge Summary (Signed)
Patient ID: Matthew Schmitt MRN: [id number] DOB/AGE: May 04, 1951 63 y.o.  Admit date: 05/17/2014 Discharge date: 05/18/2014    Discharge Diagnoses:   Present on Admission:  **None**  Consults:  None    Discharge Medications:   Medication List    STOP taking these medications       aspirin 81 MG tablet     bromocriptine 2.5 MG tablet  Commonly known as:  PARLODEL      TAKE these medications       acetaminophen 500 MG tablet  Commonly known as:  TYLENOL  Take 500 mg by mouth every 6 (six) hours as needed for pain.     amLODipine 5 MG tablet  Commonly known as:  NORVASC  Take 5 mg by mouth daily. Patient takes in am     doxazosin 8 MG tablet  Commonly known as:  CARDURA  Take 8 mg by mouth at bedtime.     HYDROcodone-acetaminophen 7.5-325 MG per tablet  Commonly known as:  NORCO  Take 1 tablet by mouth every 6 (six) hours as needed for moderate pain.     HYDROcodone-acetaminophen 7.5-325 MG per tablet  Commonly known as:  NORCO  Take 1 tablet by mouth every 6 (six) hours as needed for moderate pain.     insulin detemir 100 UNIT/ML injection  Commonly known as:  LEVEMIR  Inject 36 Units into the skin at bedtime. Patient takes in am started on 05/08/2014     metoprolol tartrate 25 MG tablet  Commonly known as:  LOPRESSOR  Take 25 mg by mouth 2 (two) times daily.     potassium citrate 10 MEQ (1080 MG) SR tablet  Commonly known as:  UROCIT-K  Take 2 tablets (20 mEq total) by mouth 2 (two) times daily.     pravastatin 40 MG tablet  Commonly known as:  PRAVACHOL  Take 40 mg by mouth every evening.     tamsulosin 0.4 MG Caps capsule  Commonly known as:  FLOMAX  Take 0.4 mg by mouth 2 (two) times a week. Sundays and Thursday mornings     valsartan-hydrochlorothiazide 320-25 MG per tablet  Commonly known as:  DIOVAN-HCT  Take 1 tablet by mouth daily.         Significant Diagnostic Studies:  Ct Abdomen Wo Contrast  05/18/2014   CLINICAL DATA:  Post stone  removal with nephrostomy tube. Intermittent right flank pain, back discomfort.  EXAM: CT ABDOMEN WITHOUT CONTRAST  TECHNIQUE: Multidetector CT imaging of the abdomen was performed following the standard protocol without IV contrast.  COMPARISON:  02/05/2014  FINDINGS: Trace bilateral pleural effusions. Minimal dependent atelectasis in the lung bases. Heart is normal size.  Right posterior small bore catheter is seen passing posteriorly into the kidney and down the right ureter. There is an adjacent drainage tube with the tip at the posterior edge of the kidney. This appears to most likely be outside the collecting system. There is a small amount of high density material surrounding the posterior lower aspect of the right kidney which could represent bloody urine or hematoma. Mild right perinephric stranding is new since prior study. Small amount of air within the right renal collecting system. Multiple small nonobstructing right renal stones. No left renal stone or hydronephrosis.  Small gallstones layer in the gallbladder. Liver, spleen, pancreas and right adrenal are unremarkable. Small nodular area within the left adrenal gland measures 16 mm, nonspecific. This does not measure low-density enough to confirm this is an adenoma but is  stable since 2013 study and most likely a benign adenoma.  Visualized large and small bowel as well as stomach are unremarkable. No free fluid, free air or adenopathy. Aorta is normal caliber.  IMPRESSION: Right posterior renal drainage catheter tip is not definitively within the collecting systems, but near the posterior edge of the right lower pole. Small amount of perinephric high-density material around the posterior lower pole of the right kidney could reflect hematoma or bloody urine. Small bore catheter passes into the right ureter.  Right nephrolithiasis.  Cholelithiasis.  Trace bilateral effusions and bibasilar atelectasis.   Electronically Signed   By: Rolm Baptise M.D.    On: 05/18/2014 08:21   Ir Dil Ureter Right  05/17/2014   CLINICAL DATA:  22 year old with right kidney stones. Plan for percutaneous nephrolithotomy procedure.  EXAM: DILATATION OF RIGHT NEPHROSTOMY TUBE ACCESS.  Physician: Stephan Minister. Anselm Pancoast, MD  FLUOROSCOPY TIME:  See operative record  MEDICATIONS: General anesthesia  ANESTHESIA/SEDATION: General anesthesia  PROCEDURE: The patient was under general anesthesia. Patient was in a prone position. The right flank and existing catheter were prepped and draped in sterile fashion. Maximal barrier sterile technique was utilized including caps, mask, sterile gowns, sterile gloves, sterile drape, hand hygiene and skin antiseptic. A stiff Amplatz wire was placed through the nephroureteral catheter. A peel-away sheath was placed and a second stiff Amplatz wire was placed as a safety wire. The 24 French high-pressure dilatation balloon was advanced to the right kidney lower pole and inflated under fluoroscopy. After the soft tissue dilatation, the access sheath was advanced over the balloon and used for access for stone removal. Stone removal was performed by Dr. Risa Grill.  FINDINGS: Fluoroscopic images demonstrated placement of two stiff Amplatz wires and dilatation of the soft tissues and renal access. The final operative images demonstrate placement of a large bore nephrostomy tube within a lower pole calyx.  COMPLICATIONS: None  IMPRESSION: Successful dilatation of the right nephrostomy access for stone removal.   Electronically Signed   By: Markus Daft M.D.   On: 05/17/2014 17:46   Ir US Guide Bx Asp/drain  05/17/2014   CLINICAL DATA:  63 year old with right renal stones. Patient is scheduled for a percutaneous nephrolithotomy procedure.  EXAM: PLACEMENT OF RIGHT NEPHROURETERAL CATHETER WITH ULTRASOUND AND FLUOROSCOPIC GUIDANCE  Physician: Stephan Minister. Anselm Pancoast, MD  FLUOROSCOPY TIME:  17 min and 6 seconds  MEDICATIONS: Ciprofloxacin 400 mg. 4 mg versed, 100 mcg fentanyl. A radiology  nurse monitored the patient for moderate sedation. Ciprofloxacin was given within two hours of incision.  ANESTHESIA/SEDATION: Moderate sedation time: 50 min  CONTRAST:  25 mL Omnipaque 300  PROCEDURE: The procedure was explained to the patient. The risks and benefits of the procedure were discussed and the patient's questions were addressed. Informed consent was obtained from the patient. The patient was placed prone on the interventional table. The right flank was prepped and draped in sterile fashion. Maximal barrier sterile technique was utilized including caps, mask, sterile gowns, sterile gloves, sterile drape, hand hygiene and skin antiseptic. The stones in the right kidney lower pole were faintly identified with fluoroscopy. Echogenic stones were better seen on ultrasound. The skin was anesthetized with 1% lidocaine. Initially, a 75 gauge needle was directed into the right kidney stone with ultrasound guidance. The renal collecting system did not well opacified with contour under fluoroscopy. As result, a 21 gauge needle was directed onto the stone with fluoroscopy. The renal collecting system was opacified with contrast. A 0.018 wire  was advanced into the collecting system. Attempted to place an Accustick dilator set but access was lost. The stone was again punctured with a 21 gauge needle and this time the collecting system was opacified with contrast and air. At this point, the wire advanced further into the renal collecting system and a dilator set was successfully placed. A Bentson wire and Glide catheter were successfully advanced into the right ureter and urinary bladder. Catheter was sutured to the skin and capped.  FINDINGS: Large stones in the right kidney lower pole. Percutaneous access was obtained through a lower pole calyx and adjacent to the stones. A catheter was successfully advanced into the right ureter and urinary bladder.  COMPLICATIONS: None  IMPRESSION: Successful placement of a right  percutaneous nephroureteral catheter through lower pole access.  Large stone or stones involving the right kidney lower pole.   Electronically Signed   By: Markus Daft M.D.   On: 05/17/2014 15:17   Dg C-arm 61-120 Min-no Report  05/17/2014   CLINICAL DATA: kidney stone   C-ARM 61-120 MINUTES  Fluoroscopy was utilized by the requesting physician.  No radiographic  interpretation.    Ir Oris Drone Cath Perc Right  05/17/2014   CLINICAL DATA:  63 year old with right renal stones. Patient is scheduled for a percutaneous nephrolithotomy procedure.  EXAM: PLACEMENT OF RIGHT NEPHROURETERAL CATHETER WITH ULTRASOUND AND FLUOROSCOPIC GUIDANCE  Physician: Stephan Minister. Anselm Pancoast, MD  FLUOROSCOPY TIME:  17 min and 6 seconds  MEDICATIONS: Ciprofloxacin 400 mg. 4 mg versed, 100 mcg fentanyl. A radiology nurse monitored the patient for moderate sedation. Ciprofloxacin was given within two hours of incision.  ANESTHESIA/SEDATION: Moderate sedation time: 50 min  CONTRAST:  25 mL Omnipaque 300  PROCEDURE: The procedure was explained to the patient. The risks and benefits of the procedure were discussed and the patient's questions were addressed. Informed consent was obtained from the patient. The patient was placed prone on the interventional table. The right flank was prepped and draped in sterile fashion. Maximal barrier sterile technique was utilized including caps, mask, sterile gowns, sterile gloves, sterile drape, hand hygiene and skin antiseptic. The stones in the right kidney lower pole were faintly identified with fluoroscopy. Echogenic stones were better seen on ultrasound. The skin was anesthetized with 1% lidocaine. Initially, a 47 gauge needle was directed into the right kidney stone with ultrasound guidance. The renal collecting system did not well opacified with contour under fluoroscopy. As result, a 21 gauge needle was directed onto the stone with fluoroscopy. The renal collecting system was opacified with contrast. A  0.018 wire was advanced into the collecting system. Attempted to place an Accustick dilator set but access was lost. The stone was again punctured with a 21 gauge needle and this time the collecting system was opacified with contrast and air. At this point, the wire advanced further into the renal collecting system and a dilator set was successfully placed. A Bentson wire and Glide catheter were successfully advanced into the right ureter and urinary bladder. Catheter was sutured to the skin and capped.  FINDINGS: Large stones in the right kidney lower pole. Percutaneous access was obtained through a lower pole calyx and adjacent to the stones. A catheter was successfully advanced into the right ureter and urinary bladder.  COMPLICATIONS: None  IMPRESSION: Successful placement of a right percutaneous nephroureteral catheter through lower pole access.  Large stone or stones involving the right kidney lower pole.   Electronically Signed   By: Scherrie Gerlach.D.  On: 05/17/2014 15:17      Hospital Course:  Active Problems:   Nephrolithiasis  Patient had placement of a right nephrostomy tube by interventional radiology the morning of surgery. The patient was subsequently taken the operating room where he had uneventful percutaneous nephrolithotomy for 2-1/2-3 cm stone in the lower pole of his right kidney. Nephrostomy tube was placed as well as a ureteral catheter the patient had no significant problems overnight. Hemoglobin did drop to 10.8 the patient remained stable. Foley catheter was removed and he was able to void well. On followup CT imaging there were some residual fragments but very minimal residual stone burden. No evidence of significant perinephric hematoma or large urine collection. I went ahead and removed his nephrostomy tube and ureteral catheter. Day of Discharge BP 130/60  Pulse 59  Temp(Src) 97.8 F (36.6 C) (Oral)  Resp 12  Ht 5\' 11"  (1.803 m)  Wt 250 lb (113.4 kg)  BMI 34.88 kg/m2   SpO2 99%  Well-developed well-nourished male in no acute distress Abdomen soft. Mild right CVA tenderness. Nephrostomy tube incision without active bleeding. Respiratory effort normal. Cardiac regular rate and rhythm. Extremities without edema or tenderness  Results for orders placed during the hospital encounter of 05/17/14 (from the past 24 hour(s))  BASIC METABOLIC PANEL     Status: Abnormal   Collection Time    05/18/14  4:33 AM      Result Value Ref Range   Sodium 135 (*) 137 - 147 mEq/L   Potassium 3.9  3.7 - 5.3 mEq/L   Chloride 100  96 - 112 mEq/L   CO2 25  19 - 32 mEq/L   Glucose, Bld 239 (*) 70 - 99 mg/dL   BUN 17  6 - 23 mg/dL   Creatinine, Ser 0.85  0.50 - 1.35 mg/dL   Calcium 8.3 (*) 8.4 - 10.5 mg/dL   GFR calc non Af Amer >90  >90 mL/min   GFR calc Af Amer >90  >90 mL/min  CBC     Status: Abnormal   Collection Time    05/18/14  4:33 AM      Result Value Ref Range   WBC 6.2  4.0 - 10.5 K/uL   RBC 3.87 (*) 4.22 - 5.81 MIL/uL   Hemoglobin 10.8 (*) 13.0 - 17.0 g/dL   HCT 31.7 (*) 39.0 - 52.0 %   MCV 81.9  78.0 - 100.0 fL   MCH 27.9  26.0 - 34.0 pg   MCHC 34.1  30.0 - 36.0 g/dL   RDW 13.3  11.5 - 15.5 %   Platelets 127 (*) 150 - 400 K/uL  GLUCOSE, CAPILLARY     Status: Abnormal   Collection Time    05/18/14  7:51 AM      Result Value Ref Range   Glucose-Capillary 182 (*) 70 - 99 mg/dL  GLUCOSE, CAPILLARY     Status: Abnormal   Collection Time    05/18/14 11:26 AM      Result Value Ref Range   Glucose-Capillary 263 (*) 70 - 99 mg/dL

## 2014-05-18 NOTE — Discharge Instructions (Signed)
DISCHARGE INSTRUCTIONS FOR PCNL  MEDICATIONS:  1. DO NOT RESUME YOUR IBUPROFEN, or any other medicines like aspirin, motrin, excedrin, advil, aleve, vitamin E, fish oil as these can all cause bleeding x 10 days.  2. Resume all your other meds from home - except do not take any other pain meds that you may have at home. ACTIVITY 1. No strenuous activity, sexual activity, or lifting greater than 10 pounds for 3 weeks. 2. No driving while on narcotic pain medications 3. Drink plenty of water 4. Continue to walk at home - you can still get blood clots when you are at home, so keep active, but don't over do it. 5. May return to work in 1 week (but not heavy or strenuous activity).  BATHING 1. You can shower and we recommend daily showers.  Cover your wound with a dressing and remove the dressing immediately after the shower.  Do not submerge wound under water.  WOUND CARE Your wound will drain bloody fluid and may do so for 7-14 days. You have 2 options for dressings:  1. You may use kerlex (rolled up gauze) and tape to dress your wound.  If you choose this method, then change the dressing as it becomes soaked.  Change it at least once daily until it stops draining. 2. You may use and ostomy device.  This is a bag with an andhesive circle.  The circle has a hole in the middle of it and you cut the hole to the size needed to fit the wound.  This will collect the drainage in the bag and allow you to drain the bag as needed.  SIGNS/SYMPTOMS TO CALL: 1. Please call us if you have a fever greater than 101.5, uncontrolled nausea/vomiting, uncontrolled pain, dizziness, unable to urinate, bloody urine, chest pain, shortness of breath, leg swelling, leg pain, redness around wound, drainage from wound, or any other concerns or questions. 2. You can reach [place] at [phone]. FOLLOW-UP 1.  You will have a follow up next week  May restart aspirin in 5 days

## 2014-06-09 ENCOUNTER — Ambulatory Visit (INDEPENDENT_AMBULATORY_CARE_PROVIDER_SITE_OTHER): Payer: BC Managed Care – PPO | Admitting: Endocrinology

## 2014-06-09 ENCOUNTER — Encounter: Payer: Self-pay | Admitting: Endocrinology

## 2014-06-09 VITALS — BP 126/72 | HR 74 | Temp 98.7°F | Ht 71.0 in | Wt 249.0 lb

## 2014-06-09 DIAGNOSIS — E119 Type 2 diabetes mellitus without complications: Secondary | ICD-10-CM

## 2014-06-09 NOTE — Patient Instructions (Addendum)
check your blood sugar once a day.  vary the time of day when you check, between before the 3 meals, and at bedtime.  also check if you have symptoms of your blood sugar being too high or too low.  please keep a record of the readings and bring it to your next appointment here.  please call us sooner if you are having low blood sugar episodes, or if it is running above 150.   Please increase levemir to 50 units each morning.  This is probably mot enough. Call if it is high, so we can increase further.  Please come back for a follow-up appointment in 6 weeks.

## 2014-06-09 NOTE — Progress Notes (Signed)
Subjective:    Patient ID: Matthew Schmitt, male    DOB: [birth year], 63 y.o.   MRN: [id number]  HPI Pt returns for f/u if type 2 DM (dx'ed 2002, on a routine blood test; he has mild if any neuropathy of the lower extremities; he has associated nephropathy, h/o leg ulcers, and CAD; he takes victoza and 3 oral agents; he did not tolerate actos, due to edema; he has never been on insulin; he is willing to take insulin, but only qd).  he brings a record of his cbg's which i have reviewed today.  He checks in am only.  It varies from 185-300.  pt states he feels well in general.  Past Medical History  Diagnosis Date  . Impotence of organic origin   . Mixed hyperlipidemia     slightly  . Essential hypertension, benign   . Type II or unspecified type diabetes mellitus without mention of complication, uncontrolled   . History of kidney stones   . Coronary artery disease   . Anginal pain   . RBBB   . Atrial fibrillation May 2014    "heart went to fast"  . Arthritis     everywhere  . Heart murmur     Past Surgical History  Procedure Laterality Date  . Knee surgery  1978  &  1974    BILATERAL  . Cardiac catheterization  08-17-2011  DR SPENCER TILLEY    POSSIBLE MODERATE LAD STENOSIS JUST AFTER THE BIFURCATION OF LARGE DIAGONAL BRANCH/ LVEF  40-45%/ MILD GLOBAL HYPODINESIS (CATH DONE FOR ABNORMAL STRESS TEST OF FIXED LATERAL WALL DEFECT & BIFASCICULAR BLAOCK)  . Tonsillectomy  36  (age 18)  . Cystoscopy with retrograde pyelogram, ureteroscopy and stent placement  11/03/2012    Procedure: CYSTOSCOPY WITH RETROGRADE PYELOGRAM, URETEROSCOPY AND STENT PLACEMENT;  Surgeon: Bernestine Amass, MD;  Location: [facility];  Service: Urology;  Laterality: Left;  Cystoscopy/Left Retrograde/Ureteroscopy/Holmium Laser Litho/Double J Stent  . Holmium laser application  63/11/6008    Procedure: HOLMIUM LASER APPLICATION;  Surgeon: Bernestine Amass, MD;  Location: [facility];   Service: Urology;  Laterality: Left;  . Cardioversion N/A 04/02/2013    Procedure: CARDIOVERSION;  Surgeon: Jacolyn Reedy, MD;  Location: [place];  Service: Cardiovascular;  Laterality: N/A;  . Pilonidal cyst excision    . Robotic assited partial nephrectomy Left 06/18/2013    Procedure: ROBOTIC ASSISTED PARTIAL NEPHRECTOMY;  Surgeon: Dutch Gray, MD;  Location: [facility];  Service: Urology;  Laterality: Left;  . Nephrolithotomy Right 05/17/2014    Procedure: NEPHROLITHOTOMY PERCUTANEOUS;  Surgeon: Bernestine Amass, MD;  Location: [facility];  Service: Urology;  Laterality: Right;    History   Social History  . Marital Status: Married    Spouse Name: N/A    Number of Children: N/A  . Years of Education: 16   Occupational History  . Government social research officer, Psychologist, occupational    Social History Main Topics  . Smoking status: Former Smoker -- 1.00 packs/day for 6 years    Types: Cigarettes    Quit date: 10/31/1969  . Smokeless tobacco: Never Used     Comment: QUIT SMOKING IN 1970'S  . Alcohol Use: Yes     Comment: OCCASIONAL  . Drug Use: No  . Sexual Activity: Not on file   Other Topics Concern  . Not on file   Social History Narrative   Regular exercise-no    Current Outpatient Prescriptions on File Prior to  Visit  Medication Sig Dispense Refill  . acetaminophen (TYLENOL) 500 MG tablet Take 500 mg by mouth every 6 (six) hours as needed for pain.      Marland Kitchen amLODipine (NORVASC) 5 MG tablet Take 5 mg by mouth daily. Patient takes in am      . doxazosin (CARDURA) 8 MG tablet Take 8 mg by mouth at bedtime.       . insulin detemir (LEVEMIR) 100 UNIT/ML injection Inject 50 Units into the skin at bedtime. Patient takes in am started on 05/08/2014      . metoprolol tartrate (LOPRESSOR) 25 MG tablet Take 25 mg by mouth 2 (two) times daily.       . potassium citrate (UROCIT-K) 10 MEQ (1080 MG) SR tablet Take 2 tablets (20 mEq total) by mouth 2 (two) times daily.  120 tablet  60  . pravastatin (PRAVACHOL) 40  MG tablet Take 40 mg by mouth every evening.       . Tamsulosin HCl (FLOMAX) 0.4 MG CAPS Take 0.4 mg by mouth 2 (two) times a week. Sundays and Thursday mornings      . valsartan-hydrochlorothiazide (DIOVAN-HCT) 320-25 MG per tablet Take 1 tablet by mouth daily.      Marland Kitchen HYDROcodone-acetaminophen (NORCO) 7.5-325 MG per tablet Take 1 tablet by mouth every 6 (six) hours as needed for moderate pain.      Marland Kitchen HYDROcodone-acetaminophen (NORCO) 7.5-325 MG per tablet Take 1 tablet by mouth every 6 (six) hours as needed for moderate pain.  30 tablet  0   No current facility-administered medications on file prior to visit.    Allergies  Allergen Reactions  . Other Cough    Blood pressure medication-reaction was about 18 years ago  . Pioglitazone     edema    Family History  Problem Relation Age of Onset  . Arthritis Other     Parents  . Hypertension Other     Parents  . Diabetes Other     Parents    BP 126/72  Pulse 74  Temp(Src) 98.7 F (37.1 C) (Oral)  Ht 5\' 11"  (1.803 m)  Wt 249 lb (112.946 kg)  BMI 34.74 kg/m2  SpO2 97%    Review of Systems He denies hypoglycemia and weight change.    Objective:   Physical Exam VITAL SIGNS:  See vs page GENERAL: no distress SKIN:  Insulin injection sites at the anterior abdomen are normal.     Lab Results  Component Value Date   HGBA1C 8.0* 10/28/2013      Assessment & Plan:  DM: moderate exacerbation Noncompliance with cbg checking later in the day: I'll work around this as best I can.  i told pt this is important, as he could have hypoglycemia in the afternoon.   CAD: in this context: he should avoid hypoglycemia.      Patient is advised the following: Patient Instructions  check your blood sugar once a day.  vary the time of day when you check, between before the 3 meals, and at bedtime.  also check if you have symptoms of your blood sugar being too high or too low.  please keep a record of the readings and bring it to your  next appointment here.  please call us sooner if you are having low blood sugar episodes, or if it is running above 150.   Please increase levemir to 50 units each morning.  This is probably mot enough. Call if it is high, so we can  increase further.  Please come back for a follow-up appointment in 6 weeks.

## 2014-07-20 ENCOUNTER — Ambulatory Visit (INDEPENDENT_AMBULATORY_CARE_PROVIDER_SITE_OTHER): Payer: BC Managed Care – PPO | Admitting: Endocrinology

## 2014-07-20 ENCOUNTER — Encounter: Payer: Self-pay | Admitting: Endocrinology

## 2014-07-20 VITALS — BP 136/64 | HR 63 | Temp 98.7°F | Wt 254.0 lb

## 2014-07-20 DIAGNOSIS — E1029 Type 1 diabetes mellitus with other diabetic kidney complication: Secondary | ICD-10-CM

## 2014-07-20 DIAGNOSIS — N058 Unspecified nephritic syndrome with other morphologic changes: Secondary | ICD-10-CM

## 2014-07-20 DIAGNOSIS — E1021 Type 1 diabetes mellitus with diabetic nephropathy: Secondary | ICD-10-CM

## 2014-07-20 LAB — LIPID PANEL
Cholesterol: 135 mg/dL (ref 0–200)
HDL: 32 mg/dL — ABNORMAL LOW (ref >39)
LDL Cholesterol: 77 mg/dL (ref 0–99)
Total CHOL/HDL Ratio: 4.2 Ratio
Triglycerides: 130 mg/dL (ref <150)
VLDL: 26 mg/dL (ref 0–40)

## 2014-07-20 LAB — HEMOGLOBIN A1C
Hgb A1c MFr Bld: 9.5 % — ABNORMAL HIGH (ref <5.7)
Mean Plasma Glucose: 226 mg/dL — ABNORMAL HIGH (ref <117)

## 2014-07-20 MED ORDER — INSULIN DETEMIR 100 UNIT/ML FLEXPEN: 70.0000 [IU] | PEN_INJECTOR | SUBCUTANEOUS | Status: DC

## 2014-07-20 NOTE — Progress Notes (Signed)
Subjective:    Patient ID: Matthew Schmitt, male    DOB: [birth year], 63 y.o.   MRN: [id number]  HPI Pt returns for f/u if type 2 DM (dx'ed 2002, on a routine blood test; complicated by nephropathy, leg ulcers, and CAD; he has never had pancreatitis, severe hypoglycemia, or DKA; he started taking insulin in 2015; he is willing to take insulin, but only qd).  no cbg record, but states cbg's are in the 200's.  Pt states 2 days of slight ulcer at the left leg, but no assoc pain.   Past Medical History  Diagnosis Date  . Impotence of organic origin   . Mixed hyperlipidemia     slightly  . Essential hypertension, benign   . Type II or unspecified type diabetes mellitus without mention of complication, uncontrolled   . History of kidney stones   . Coronary artery disease   . Anginal pain   . RBBB   . Atrial fibrillation May 2014    "heart went to fast"  . Arthritis     everywhere  . Heart murmur     Past Surgical History  Procedure Laterality Date  . Knee surgery  1978  &  1974    BILATERAL  . Cardiac catheterization  08-17-2011  DR SPENCER TILLEY    POSSIBLE MODERATE LAD STENOSIS JUST AFTER THE BIFURCATION OF LARGE DIAGONAL BRANCH/ LVEF  40-45%/ MILD GLOBAL HYPODINESIS (CATH DONE FOR ABNORMAL STRESS TEST OF FIXED LATERAL WALL DEFECT & BIFASCICULAR BLAOCK)  . Tonsillectomy  46  (age 15)  . Cystoscopy with retrograde pyelogram, ureteroscopy and stent placement  11/03/2012    Procedure: CYSTOSCOPY WITH RETROGRADE PYELOGRAM, URETEROSCOPY AND STENT PLACEMENT;  Surgeon: Bernestine Amass, MD;  Location: [facility];  Service: Urology;  Laterality: Left;  Cystoscopy/Left Retrograde/Ureteroscopy/Holmium Laser Litho/Double J Stent  . Holmium laser application  40/98/1191    Procedure: HOLMIUM LASER APPLICATION;  Surgeon: Bernestine Amass, MD;  Location: [facility];  Service: Urology;  Laterality: Left;  . Cardioversion N/A 04/02/2013    Procedure: CARDIOVERSION;   Surgeon: Jacolyn Reedy, MD;  Location: [place];  Service: Cardiovascular;  Laterality: N/A;  . Pilonidal cyst excision    . Robotic assited partial nephrectomy Left 06/18/2013    Procedure: ROBOTIC ASSISTED PARTIAL NEPHRECTOMY;  Surgeon: Dutch Gray, MD;  Location: [facility];  Service: Urology;  Laterality: Left;  . Nephrolithotomy Right 05/17/2014    Procedure: NEPHROLITHOTOMY PERCUTANEOUS;  Surgeon: Bernestine Amass, MD;  Location: [facility];  Service: Urology;  Laterality: Right;    History   Social History  . Marital Status: Married    Spouse Name: N/A    Number of Children: N/A  . Years of Education: 16   Occupational History  . Government social research officer, Psychologist, occupational    Social History Main Topics  . Smoking status: Former Smoker -- 1.00 packs/day for 6 years    Types: Cigarettes    Quit date: 10/31/1969  . Smokeless tobacco: Never Used     Comment: QUIT SMOKING IN 1970'S  . Alcohol Use: Yes     Comment: OCCASIONAL  . Drug Use: No  . Sexual Activity: Not on file   Other Topics Concern  . Not on file   Social History Narrative   Regular exercise-no    Current Outpatient Prescriptions on File Prior to Visit  Medication Sig Dispense Refill  . acetaminophen (TYLENOL) 500 MG tablet Take 500 mg by mouth every 6 (six) hours  as needed for pain.      Marland Kitchen amLODipine (NORVASC) 5 MG tablet Take 5 mg by mouth daily. Patient takes in am      . doxazosin (CARDURA) 8 MG tablet Take 8 mg by mouth at bedtime.       Marland Kitchen HYDROcodone-acetaminophen (NORCO) 7.5-325 MG per tablet Take 1 tablet by mouth every 6 (six) hours as needed for moderate pain.      Marland Kitchen HYDROcodone-acetaminophen (NORCO) 7.5-325 MG per tablet Take 1 tablet by mouth every 6 (six) hours as needed for moderate pain.  30 tablet  0  . metoprolol tartrate (LOPRESSOR) 25 MG tablet Take 25 mg by mouth 2 (two) times daily.       . potassium citrate (UROCIT-K) 10 MEQ (1080 MG) SR tablet Take 2 tablets (20 mEq total) by mouth 2 (two) times daily.   120 tablet  60  . pravastatin (PRAVACHOL) 40 MG tablet Take 40 mg by mouth every evening.       . Tamsulosin HCl (FLOMAX) 0.4 MG CAPS Take 0.4 mg by mouth 4 (four) times a week. Sundays and Thursday mornings      . valsartan-hydrochlorothiazide (DIOVAN-HCT) 320-25 MG per tablet Take 1 tablet by mouth daily.       No current facility-administered medications on file prior to visit.    Allergies  Allergen Reactions  . Other Cough    Blood pressure medication-reaction was about 18 years ago  . Pioglitazone     edema    Family History  Problem Relation Age of Onset  . Arthritis Other     Parents  . Hypertension Other     Parents  . Diabetes Other     Parents    BP 136/64  Pulse 63  Temp(Src) 98.7 F (37.1 C) (Oral)  Wt 254 lb (115.214 kg)  SpO2 97%  Review of Systems Denies fever and hypoglycemia.     Objective:   Physical Exam VITAL SIGNS:  See vs page GENERAL: no distress Pulses: dorsalis pedis intact bilat.  Feet: no deformity. feet are of normal color and temp. Trace bilat edema, healed ulcers, and severe hyperpigmentation of the ant tibial areas.  There is a 2 cm shallow ulcer at the left anterior tibial area. Skin: no ulcer on the feet.  Neuro: sensation is intact to touch on the feet.   Lab Results  Component Value Date   CHOL 135 07/20/2014   HDL 32* 07/20/2014   LDLCALC 77 07/20/2014   TRIG 130 07/20/2014   CHOLHDL 4.2 07/20/2014     Lab Results  Component Value Date   HGBA1C 9.5* 07/20/2014       Assessment & Plan:  DM: moderate exacerbation Leg ulcer, new Dyslipidemia: well-controlled    Patient is advised the following: Patient Instructions  check your blood sugar once a day.  vary the time of day when you check, between before the 3 meals, and at bedtime.  also check if you have symptoms of your blood sugar being too high or too low.  please keep a record of the readings and bring it to your next appointment here.  please call us sooner if you are  having low blood sugar episodes, or if it is running above 150.   blood tests are being requested for you today.  We'll contact you with results.  Please increase levemir to 70 units each morning.  This is probably not enough, so please call if it is high, so we can increase  further.  Please come back for a follow-up appointment in 2 months.   Keep the ulcer at the left leg covered with antibiotic ointment and a large bandaid, until it heals.  Please call us if it doesn't.

## 2014-07-20 NOTE — Patient Instructions (Addendum)
check your blood sugar once a day.  vary the time of day when you check, between before the 3 meals, and at bedtime.  also check if you have symptoms of your blood sugar being too high or too low.  please keep a record of the readings and bring it to your next appointment here.  please call us sooner if you are having low blood sugar episodes, or if it is running above 150.   blood tests are being requested for you today.  We'll contact you with results.  Please increase levemir to 70 units each morning.  This is probably not enough, so please call if it is high, so we can increase further.  Please come back for a follow-up appointment in 2 months.   Keep the ulcer at the left leg covered with antibiotic ointment and a large bandaid, until it heals.  Please call us if it doesn't.

## 2014-09-03 ENCOUNTER — Other Ambulatory Visit: Payer: Self-pay

## 2014-09-20 ENCOUNTER — Encounter: Payer: Self-pay | Admitting: Endocrinology

## 2014-09-20 ENCOUNTER — Ambulatory Visit (INDEPENDENT_AMBULATORY_CARE_PROVIDER_SITE_OTHER): Payer: BC Managed Care – PPO | Admitting: Endocrinology

## 2014-09-20 VITALS — BP 138/68 | HR 68 | Temp 98.0°F | Ht 71.0 in | Wt 251.0 lb

## 2014-09-20 DIAGNOSIS — E119 Type 2 diabetes mellitus without complications: Secondary | ICD-10-CM

## 2014-09-20 DIAGNOSIS — E1121 Type 2 diabetes mellitus with diabetic nephropathy: Secondary | ICD-10-CM

## 2014-09-20 MED ORDER — INSULIN DETEMIR 100 UNIT/ML FLEXPEN: 100.0000 [IU] | PEN_INJECTOR | SUBCUTANEOUS | Status: DC

## 2014-09-20 NOTE — Progress Notes (Signed)
Subjective:    Patient ID: Matthew Schmitt, male    DOB: Sep 03, 1951, 63 y.o.   MRN: [id number]  HPI  Pt returns for f/u of diabetes mellitus: DM type: Insulin-requiring type 2 Dx'ed: 2426 Complications: nephropathy, leg ulcers, and CAD Therapy: insulin since 2015 DKA: never Severe hypoglycemia: never Pancreatitis: never Other: he is willing to take insulin, but only qd Interval history: he brings a record of his cbg's which i have reviewed today.  It varies from 200-300.  He checks only in am.  pt states he feels well in general. Past Medical History  Diagnosis Date  . Impotence of organic origin   . Mixed hyperlipidemia     slightly  . Essential hypertension, benign   . Type II or unspecified type diabetes mellitus without mention of complication, uncontrolled   . History of kidney stones   . Coronary artery disease   . Anginal pain   . RBBB   . Atrial fibrillation May 2014    "heart went to fast"  . Arthritis     everywhere  . Heart murmur     Past Surgical History  Procedure Laterality Date  . Knee surgery  1978  &  1974    BILATERAL  . Cardiac catheterization  08-17-2011  DR SPENCER TILLEY    POSSIBLE MODERATE LAD STENOSIS JUST AFTER THE BIFURCATION OF LARGE DIAGONAL BRANCH/ LVEF  40-45%/ MILD GLOBAL HYPODINESIS (CATH DONE FOR ABNORMAL STRESS TEST OF FIXED LATERAL WALL DEFECT & BIFASCICULAR BLAOCK)  . Tonsillectomy  41  (age 48)  . Cystoscopy with retrograde pyelogram, ureteroscopy and stent placement  11/03/2012    Procedure: CYSTOSCOPY WITH RETROGRADE PYELOGRAM, URETEROSCOPY AND STENT PLACEMENT;  Surgeon: Bernestine Amass, MD;  Location: [facility];  Service: Urology;  Laterality: Left;  Cystoscopy/Left Retrograde/Ureteroscopy/Holmium Laser Litho/Double J Stent  . Holmium laser application  83/41/9622    Procedure: HOLMIUM LASER APPLICATION;  Surgeon: Bernestine Amass, MD;  Location: [facility];  Service: Urology;  Laterality: Left;  .  Cardioversion N/A 04/02/2013    Procedure: CARDIOVERSION;  Surgeon: Jacolyn Reedy, MD;  Location: [place];  Service: Cardiovascular;  Laterality: N/A;  . Pilonidal cyst excision    . Robotic assited partial nephrectomy Left 06/18/2013    Procedure: ROBOTIC ASSISTED PARTIAL NEPHRECTOMY;  Surgeon: Dutch Gray, MD;  Location: [facility];  Service: Urology;  Laterality: Left;  . Nephrolithotomy Right 05/17/2014    Procedure: NEPHROLITHOTOMY PERCUTANEOUS;  Surgeon: Bernestine Amass, MD;  Location: [facility];  Service: Urology;  Laterality: Right;    History   Social History  . Marital Status: Married    Spouse Name: N/A    Number of Children: N/A  . Years of Education: 16   Occupational History  . Government social research officer, Psychologist, occupational    Social History Main Topics  . Smoking status: Former Smoker -- 1.00 packs/day for 6 years    Types: Cigarettes    Quit date: 10/31/1969  . Smokeless tobacco: Never Used     Comment: QUIT SMOKING IN 1970'S  . Alcohol Use: Yes     Comment: OCCASIONAL  . Drug Use: No  . Sexual Activity: Not on file   Other Topics Concern  . Not on file   Social History Narrative   Regular exercise-no    Current Outpatient Prescriptions on File Prior to Visit  Medication Sig Dispense Refill  . acetaminophen (TYLENOL) 500 MG tablet Take 500 mg by mouth every 6 (six) hours  as needed for pain.    Marland Kitchen amLODipine (NORVASC) 5 MG tablet Take 5 mg by mouth daily. Patient takes in am    . doxazosin (CARDURA) 8 MG tablet Take 8 mg by mouth at bedtime.     . metoprolol tartrate (LOPRESSOR) 25 MG tablet Take 25 mg by mouth 2 (two) times daily.     . potassium citrate (UROCIT-K) 10 MEQ (1080 MG) SR tablet Take 2 tablets (20 mEq total) by mouth 2 (two) times daily. 120 tablet 60  . pravastatin (PRAVACHOL) 40 MG tablet Take 40 mg by mouth every evening.     . Tamsulosin HCl (FLOMAX) 0.4 MG CAPS Take 0.4 mg by mouth 4 (four) times a week. Sundays and Thursday mornings    .  valsartan-hydrochlorothiazide (DIOVAN-HCT) 320-25 MG per tablet Take 1 tablet by mouth daily.     No current facility-administered medications on file prior to visit.    Allergies  Allergen Reactions  . Other Cough    Blood pressure medication-reaction was about 18 years ago  . Pioglitazone     edema    Family History  Problem Relation Age of Onset  . Arthritis Other     Parents  . Hypertension Other     Parents  . Diabetes Other     Parents    BP 138/68 mmHg  Pulse 68  Temp(Src) 98 F (36.7 C) (Oral)  Ht 5\' 11"  (1.803 m)  Wt 251 lb (113.853 kg)  BMI 35.02 kg/m2  SpO2 96%     Review of Systems He denies hypoglycemia and weight change.      Objective:   Physical Exam VITAL SIGNS:  See vs page GENERAL: no distress Pulses: dorsalis pedis intact bilat.  Feet: no deformity. feet are of normal color and temp. Trace bilat edema, healed ulcers, and severe hyperpigmentation of the ant tibial areas. The left posterior tibial area has a healing ulcer. Skin: no ulcer on the feet.  Neuro: sensation is intact to touch on the feet.       Assessment & Plan:  DM: he needs increased rx Leg ulcer, improved Noncompliance with cbg recording: I'll work around this as best I can   Patient is advised the following: Patient Instructions  check your blood sugar once a day.  vary the time of day when you check, between before the 3 meals, and at bedtime.  also check if you have symptoms of your blood sugar being too high or too low.  please keep a record of the readings and bring it to your next appointment here.  please call us sooner if you are having low blood sugar episodes, or if it is running above 150.   blood tests are being requested for you today.  We'll contact you with results.  Please increase levemir to 100 units each morning.  This is probably not enough, so please call if it is high, so we can increase further.  Please call if the leg ulcer does not continue to heal.    Please come back for a follow-up appointment in January.

## 2014-09-20 NOTE — Patient Instructions (Addendum)
check your blood sugar once a day.  vary the time of day when you check, between before the 3 meals, and at bedtime.  also check if you have symptoms of your blood sugar being too high or too low.  please keep a record of the readings and bring it to your next appointment here.  please call us sooner if you are having low blood sugar episodes, or if it is running above 150.   blood tests are being requested for you today.  We'll contact you with results.  Please increase levemir to 100 units each morning.  This is probably not enough, so please call if it is high, so we can increase further.  Please call if the leg ulcer does not continue to heal.   Please come back for a follow-up appointment in January.

## 2014-09-21 LAB — HEMOGLOBIN A1C
Hgb A1c MFr Bld: 10.7 % — ABNORMAL HIGH (ref <5.7)
Mean Plasma Glucose: 260 mg/dL — ABNORMAL HIGH (ref <117)

## 2014-09-21 LAB — MICROALBUMIN / CREATININE URINE RATIO
Creatinine, Urine: 167 mg/dL
Microalb Creat Ratio: 17.4 mg/g (ref 0.0–30.0)
Microalb, Ur: 2.9 mg/dL — ABNORMAL HIGH (ref <2.0)

## 2014-10-18 ENCOUNTER — Ambulatory Visit (INDEPENDENT_AMBULATORY_CARE_PROVIDER_SITE_OTHER): Payer: BC Managed Care – PPO

## 2014-10-18 ENCOUNTER — Ambulatory Visit (INDEPENDENT_AMBULATORY_CARE_PROVIDER_SITE_OTHER): Payer: BC Managed Care – PPO | Admitting: Family Medicine

## 2014-10-18 VITALS — BP 132/84 | HR 70 | Temp 98.4°F | Resp 18 | Ht 71.0 in | Wt 247.8 lb

## 2014-10-18 DIAGNOSIS — M25552 Pain in left hip: Secondary | ICD-10-CM

## 2014-10-18 DIAGNOSIS — M545 Low back pain, unspecified: Secondary | ICD-10-CM

## 2014-10-18 DIAGNOSIS — M533 Sacrococcygeal disorders, not elsewhere classified: Secondary | ICD-10-CM

## 2014-10-18 MED ORDER — BACLOFEN 10 MG PO TABS: 10.0000 mg | ORAL_TABLET | Freq: Three times a day (TID) | ORAL | Status: DC

## 2014-10-18 MED ORDER — OXYCODONE HCL 5 MG PO TABS
5.0000 mg | ORAL_TABLET | Freq: Three times a day (TID) | ORAL | Status: DC | PRN
Start: 2014-10-18 — End: 2015-03-04

## 2014-10-18 NOTE — Patient Instructions (Signed)
Back Exercises These exercises may help you when beginning to rehabilitate your injury. Your symptoms may resolve with or without further involvement from your physician, physical therapist or athletic trainer. While completing these exercises, remember:   Restoring tissue flexibility helps normal motion to return to the joints. This allows healthier, less painful movement and activity.  An effective stretch should be held for at least 30 seconds.  A stretch should never be painful. You should only feel a gentle lengthening or release in the stretched tissue. STRETCH - Extension, Prone on Elbows   Lie on your stomach on the floor, a bed will be too soft. Place your palms about shoulder width apart and at the height of your head.  Place your elbows under your shoulders. If this is too painful, stack pillows under your chest.  Allow your body to relax so that your hips drop lower and make contact more completely with the floor.  Hold this position for __________ seconds.  Slowly return to lying flat on the floor. Repeat __________ times. Complete this exercise __________ times per day.  RANGE OF MOTION - Extension, Prone Press Ups   Lie on your stomach on the floor, a bed will be too soft. Place your palms about shoulder width apart and at the height of your head.  Keeping your back as relaxed as possible, slowly straighten your elbows while keeping your hips on the floor. You may adjust the placement of your hands to maximize your comfort. As you gain motion, your hands will come more underneath your shoulders.  Hold this position __________ seconds.  Slowly return to lying flat on the floor. Repeat __________ times. Complete this exercise __________ times per day.  RANGE OF MOTION- Quadruped, Neutral Spine   Assume a hands and knees position on a firm surface. Keep your hands under your shoulders and your knees under your hips. You may place padding under your knees for  comfort.  Drop your head and point your tail bone toward the ground below you. This will round out your low back like an angry cat. Hold this position for __________ seconds.  Slowly lift your head and release your tail bone so that your back sags into a large arch, like an old horse.  Hold this position for __________ seconds.  Repeat this until you feel limber in your low back.  Now, find your "sweet spot." This will be the most comfortable position somewhere between the two previous positions. This is your neutral spine. Once you have found this position, tense your stomach muscles to support your low back.  Hold this position for __________ seconds. Repeat __________ times. Complete this exercise __________ times per day.  STRETCH - Flexion, Single Knee to Chest   Lie on a firm bed or floor with both legs extended in front of you.  Keeping one leg in contact with the floor, bring your opposite knee to your chest. Hold your leg in place by either grabbing behind your thigh or at your knee.  Pull until you feel a gentle stretch in your low back. Hold __________ seconds.  Slowly release your grasp and repeat the exercise with the opposite side. Repeat __________ times. Complete this exercise __________ times per day.  STRETCH - Hamstrings, Standing  Stand or sit and extend your right / left leg, placing your foot on a chair or foot stool  Keeping a slight arch in your low back and your hips straight forward.  Lead with your chest and   lean forward at the waist until you feel a gentle stretch in the back of your right / left knee or thigh. (When done correctly, this exercise requires leaning only a small distance.)  Hold this position for __________ seconds. Repeat __________ times. Complete this stretch __________ times per day. STRENGTHENING - Deep Abdominals, Pelvic Tilt   Lie on a firm bed or floor. Keeping your legs in front of you, bend your knees so they are both pointed  toward the ceiling and your feet are flat on the floor.  Tense your lower abdominal muscles to press your low back into the floor. This motion will rotate your pelvis so that your tail bone is scooping upwards rather than pointing at your feet or into the floor.  With a gentle tension and even breathing, hold this position for __________ seconds. Repeat __________ times. Complete this exercise __________ times per day.  STRENGTHENING - Abdominals, Crunches   Lie on a firm bed or floor. Keeping your legs in front of you, bend your knees so they are both pointed toward the ceiling and your feet are flat on the floor. Cross your arms over your chest.  Slightly tip your chin down without bending your neck.  Tense your abdominals and slowly lift your trunk high enough to just clear your shoulder blades. Lifting higher can put excessive stress on the low back and does not further strengthen your abdominal muscles.  Control your return to the starting position. Repeat __________ times. Complete this exercise __________ times per day.  STRENGTHENING - Quadruped, Opposite UE/Kippy Gohman Lift   Assume a hands and knees position on a firm surface. Keep your hands under your shoulders and your knees under your hips. You may place padding under your knees for comfort.  Find your neutral spine and gently tense your abdominal muscles so that you can maintain this position. Your shoulders and hips should form a rectangle that is parallel with the floor and is not twisted.  Keeping your trunk steady, lift your right hand no higher than your shoulder and then your left leg no higher than your hip. Make sure you are not holding your breath. Hold this position __________ seconds.  Continuing to keep your abdominal muscles tense and your back steady, slowly return to your starting position. Repeat with the opposite arm and leg. Repeat __________ times. Complete this exercise __________ times per day. Document Released:  11/23/2005 Document Revised: 01/28/2012 Document Reviewed: 02/17/2009 Exit[employer] Patient Information 2015 [employer]. This information is not intended to replace advice given to you by your health care provider. Make sure you discuss any questions you have with your health care provider.  

## 2014-10-18 NOTE — Progress Notes (Signed)
Chief Complaint:  Chief Complaint  Patient presents with  . Hip Injury    left side--2 to 3 weeks ago--was doing some yard work, cutting trees--sharp pain when he wakes up in the morning    HPI: Matthew Schmitt is a 63 y.o. male who is here for 3 weeks ago he was doing yard work and started having hip pain Sharp pain occ, but most of the time dull ache, constant. Denies acute incontinence, deneis w/n/t, osteoporosis,osteopenia, prior fractures He has tried tylenol , He cannot take NSAIDs, had bleeding while on other blood thinners Also has had neprhectomy due to benign lesions and stones  Past Medical History  Diagnosis Date  . Impotence of organic origin   . Mixed hyperlipidemia     slightly  . Essential hypertension, benign   . Type II or unspecified type diabetes mellitus without mention of complication, uncontrolled   . History of kidney stones   . Coronary artery disease   . Anginal pain   . RBBB   . Atrial fibrillation May 2014    "heart went to fast"  . Arthritis     everywhere  . Heart murmur    Past Surgical History  Procedure Laterality Date  . Knee surgery  1978  &  1974    BILATERAL  . Cardiac catheterization  08-17-2011  DR SPENCER TILLEY    POSSIBLE MODERATE LAD STENOSIS JUST AFTER THE BIFURCATION OF LARGE DIAGONAL BRANCH/ LVEF  40-45%/ MILD GLOBAL HYPODINESIS (CATH DONE FOR ABNORMAL STRESS TEST OF FIXED LATERAL WALL DEFECT & BIFASCICULAR BLAOCK)  . Tonsillectomy  6  (age 24)  . Cystoscopy with retrograde pyelogram, ureteroscopy and stent placement  11/03/2012    Procedure: CYSTOSCOPY WITH RETROGRADE PYELOGRAM, URETEROSCOPY AND STENT PLACEMENT;  Surgeon: Bernestine Amass, MD;  Location: [facility];  Service: Urology;  Laterality: Left;  Cystoscopy/Left Retrograde/Ureteroscopy/Holmium Laser Litho/Double J Stent  . Holmium laser application  81/85/6314    Procedure: HOLMIUM LASER APPLICATION;  Surgeon: Bernestine Amass, MD;  Location: [facility];  Service: Urology;  Laterality: Left;  . Cardioversion N/A 04/02/2013    Procedure: CARDIOVERSION;  Surgeon: Jacolyn Reedy, MD;  Location: [place];  Service: Cardiovascular;  Laterality: N/A;  . Pilonidal cyst excision    . Robotic assited partial nephrectomy Left 06/18/2013    Procedure: ROBOTIC ASSISTED PARTIAL NEPHRECTOMY;  Surgeon: Dutch Gray, MD;  Location: [facility];  Service: Urology;  Laterality: Left;  . Nephrolithotomy Right 05/17/2014    Procedure: NEPHROLITHOTOMY PERCUTANEOUS;  Surgeon: Bernestine Amass, MD;  Location: [facility];  Service: Urology;  Laterality: Right;   History   Social History  . Marital Status: Married    Spouse Name: N/A    Number of Children: N/A  . Years of Education: 16   Occupational History  . Government social research officer, Psychologist, occupational    Social History Main Topics  . Smoking status: Former Smoker -- 1.00 packs/day for 6 years    Types: Cigarettes    Quit date: 10/31/1969  . Smokeless tobacco: Never Used     Comment: QUIT SMOKING IN 1970'S  . Alcohol Use: 0.0 oz/week    0 Not specified per week     Comment: OCCASIONAL  . Drug Use: No  . Sexual Activity: None   Other Topics Concern  . None   Social History Narrative   Regular exercise-no   Family History  Problem Relation Age of Onset  . Arthritis  Other     Parents  . Hypertension Other     Parents  . Diabetes Other     Parents   Allergies  Allergen Reactions  . Other Cough    Blood pressure medication-reaction was about 18 years ago  . Pioglitazone     edema   Prior to Admission medications   Medication Sig Start Date End Date Taking? Authorizing Provider  acetaminophen (TYLENOL) 500 MG tablet Take 500 mg by mouth every 6 (six) hours as needed for pain.   Yes Historical Provider, MD  amLODipine (NORVASC) 5 MG tablet Take 5 mg by mouth daily. Patient takes in am   Yes Historical Provider, MD  aspirin 81 MG tablet Take 81 mg by mouth daily.   Yes Historical Provider, MD    doxazosin (CARDURA) 8 MG tablet Take 8 mg by mouth at bedtime.    Yes Historical Provider, MD  Insulin Detemir (LEVEMIR FLEXTOUCH) 100 UNIT/ML Pen Inject 100 Units into the skin every morning. And pen needles 2/day Patient taking differently: Inject 70 Units into the skin every morning. And pen needles 2/day 09/20/14  Yes Renato Shin, MD  metoprolol tartrate (LOPRESSOR) 25 MG tablet Take 25 mg by mouth 2 (two) times daily.    Yes Historical Provider, MD  potassium citrate (UROCIT-K) 10 MEQ (1080 MG) SR tablet Take 2 tablets (20 mEq total) by mouth 2 (two) times daily. 05/18/14  Yes Bernestine Amass, MD  pravastatin (PRAVACHOL) 40 MG tablet Take 40 mg by mouth every evening.    Yes Historical Provider, MD  Tamsulosin HCl (FLOMAX) 0.4 MG CAPS Take 0.4 mg by mouth 4 (four) times a week. Sundays and Thursday mornings   Yes Historical Provider, MD  valsartan-hydrochlorothiazide (DIOVAN-HCT) 320-25 MG per tablet Take 1 tablet by mouth daily.   Yes Historical Provider, MD     ROS: The patient denies fevers, chills, night sweats, unintentional weight loss, chest pain, palpitations, wheezing, dyspnea on exertion, nausea, vomiting, abdominal pain, dysuria, hematuria, melena, numbness, weakness, or tingling.  All other systems have been reviewed and were otherwise negative with the exception of those mentioned in the HPI and as above.    PHYSICAL EXAM: Filed Vitals:   10/18/14 0830  BP: 132/84  Pulse: 70  Temp: 98.4 F (36.9 C)  Resp: 18   Filed Vitals:   10/18/14 0830  Height: 5\' 11"  (1.803 m)  Weight: 247 lb 12.8 oz (112.401 kg)   Body mass index is 34.58 kg/(m^2).  General: Alert, no acute distress HEENT:  Normocephalic, atraumatic, oropharynx patent. EOMI, PERRLA Cardiovascular:  Regular rate and rhythm, no rubs murmurs or gallops.  No Carotid bruits, radial pulse intact. No pedal edema.  Respiratory: Clear to auscultation bilaterally.  No wheezes, rales, or rhonchi.  No cyanosis, no use of  accessory musculature GI: No organomegaly, abdomen is soft and non-tender, positive bowel sounds.  No masses. Skin: No rashes. Neurologic: Facial musculature symmetric. Psychiatric: Patient is appropriate throughout our interaction. Lymphatic: No cervical lymphadenopathy Musculoskeletal: Gait intact. + paramsk tenderness , left Si jt tenderenss Decrease ROM in flexion d.t pain Greater troch is nontender 5/5 strength, 2/2 DTRs No saddle anesthesia Straight leg negative Hip and knee exam--normal     LABS: Results for orders placed or performed in visit on 09/20/14  Hemoglobin A1c  Result Value Ref Range   Hgb A1c MFr Bld 10.7 (H) <5.7 %   Mean Plasma Glucose 260 (H) <117 mg/dL  Microalbumin / creatinine urine ratio  Result  Value Ref Range   Microalb, Ur 2.9 (H) <2.0 mg/dL   Creatinine, Urine 167.0 mg/dL   Microalb Creat Ratio 17.4 0.0 - 30.0 mg/g     EKG/XRAY:   Primary read interpreted by Dr. Marin Comment at [facility]. Neg fx or dislocation + DJD   ASSESSMENT/PLAN: Encounter Diagnoses  Name Primary?  . Left hip pain Yes  . Left-sided low back pain without sciatica   . Sacral back pain    Likely sprain/strain vs sacra illitis Rx oxycodone, baclofen He cannot take NSAIDs Can use tylenol prn since I am only prescribing him oxycodone separate from tylenol Will not give steroids, if he needs it it will be better to be localized Refer to ortho for eval possible ESI   Gross sideeffects, risk and benefits, and alternatives of medications d/w patient. Patient is aware that all medications have potential sideeffects and we are unable to predict every sideeffect or drug-drug interaction that may occur.  Nikita Surman, [place], DO 10/18/2014 9:58 AM

## 2014-11-26 ENCOUNTER — Encounter: Payer: Self-pay | Admitting: Endocrinology

## 2014-11-26 ENCOUNTER — Ambulatory Visit (INDEPENDENT_AMBULATORY_CARE_PROVIDER_SITE_OTHER): Payer: BC Managed Care – PPO | Admitting: Endocrinology

## 2014-11-26 VITALS — BP 134/62 | HR 73 | Temp 98.0°F | Ht 71.0 in | Wt 254.0 lb

## 2014-11-26 DIAGNOSIS — E1021 Type 1 diabetes mellitus with diabetic nephropathy: Secondary | ICD-10-CM

## 2014-11-26 LAB — HEMOGLOBIN A1C: Hgb A1c MFr Bld: 11.1 % — ABNORMAL HIGH (ref 4.6–6.5)

## 2014-11-26 MED ORDER — INSULIN DETEMIR 100 UNIT/ML FLEXPEN: 150.0000 [IU] | PEN_INJECTOR | SUBCUTANEOUS | Status: DC

## 2014-11-26 MED ORDER — GLUCOSE BLOOD VI STRP: 1.0000 | ORAL_STRIP | Freq: Two times a day (BID) | Status: DC

## 2014-11-26 NOTE — Progress Notes (Signed)
Subjective:    Patient ID: Matthew Schmitt, male    DOB: 06/02/1951, 64 y.o.   MRN: [id number]  HPI Pt returns for f/u of diabetes mellitus: DM type: Insulin-requiring type 2. Dx'ed: 3267 Complications: nephropathy, leg ulcers, and CAD Therapy: insulin since 2015 DKA: never Severe hypoglycemia: never Pancreatitis: never Other: he is willing to take insulin, but only qd Interval history: he brings a record of his cbg's which i have reviewed today.  It varies from 246-334.  He checks only in am.  pt states he feels well in general.  Pt says he misses the insulin approx once every 2 weeks.  pt states he feels well in general. Past Medical History  Diagnosis Date  . Impotence of organic origin   . Mixed hyperlipidemia     slightly  . Essential hypertension, benign   . Type II or unspecified type diabetes mellitus without mention of complication, uncontrolled   . History of kidney stones   . Coronary artery disease   . Anginal pain   . RBBB   . Atrial fibrillation May 2014    "heart went to fast"  . Arthritis     everywhere  . Heart murmur     Past Surgical History  Procedure Laterality Date  . Knee surgery  1978  &  1974    BILATERAL  . Cardiac catheterization  08-17-2011  DR SPENCER TILLEY    POSSIBLE MODERATE LAD STENOSIS JUST AFTER THE BIFURCATION OF LARGE DIAGONAL BRANCH/ LVEF  40-45%/ MILD GLOBAL HYPODINESIS (CATH DONE FOR ABNORMAL STRESS TEST OF FIXED LATERAL WALL DEFECT & BIFASCICULAR BLAOCK)  . Tonsillectomy  35  (age 27)  . Cystoscopy with retrograde pyelogram, ureteroscopy and stent placement  11/03/2012    Procedure: CYSTOSCOPY WITH RETROGRADE PYELOGRAM, URETEROSCOPY AND STENT PLACEMENT;  Surgeon: Bernestine Amass, MD;  Location: [facility];  Service: Urology;  Laterality: Left;  Cystoscopy/Left Retrograde/Ureteroscopy/Holmium Laser Litho/Double J Stent  . Holmium laser application  12/45/8099    Procedure: HOLMIUM LASER APPLICATION;  Surgeon: Bernestine Amass, MD;  Location: [facility];  Service: Urology;  Laterality: Left;  . Cardioversion N/A 04/02/2013    Procedure: CARDIOVERSION;  Surgeon: Jacolyn Reedy, MD;  Location: [place];  Service: Cardiovascular;  Laterality: N/A;  . Pilonidal cyst excision    . Robotic assited partial nephrectomy Left 06/18/2013    Procedure: ROBOTIC ASSISTED PARTIAL NEPHRECTOMY;  Surgeon: Dutch Gray, MD;  Location: [facility];  Service: Urology;  Laterality: Left;  . Nephrolithotomy Right 05/17/2014    Procedure: NEPHROLITHOTOMY PERCUTANEOUS;  Surgeon: Bernestine Amass, MD;  Location: [facility];  Service: Urology;  Laterality: Right;    History   Social History  . Marital Status: Married    Spouse Name: N/A    Number of Children: N/A  . Years of Education: 16   Occupational History  . Government social research officer, Psychologist, occupational    Social History Main Topics  . Smoking status: Former Smoker -- 1.00 packs/day for 6 years    Types: Cigarettes    Quit date: 10/31/1969  . Smokeless tobacco: Never Used     Comment: QUIT SMOKING IN 1970'S  . Alcohol Use: 0.0 oz/week    0 Not specified per week     Comment: OCCASIONAL  . Drug Use: No  . Sexual Activity: Not on file   Other Topics Concern  . Not on file   Social History Narrative   Regular exercise-no    Current  Outpatient Prescriptions on File Prior to Visit  Medication Sig Dispense Refill  . acetaminophen (TYLENOL) 500 MG tablet Take 500 mg by mouth every 6 (six) hours as needed for pain.    Marland Kitchen amLODipine (NORVASC) 5 MG tablet Take 5 mg by mouth daily. Patient takes in am    . aspirin 81 MG tablet Take 81 mg by mouth daily.    Marland Kitchen doxazosin (CARDURA) 8 MG tablet Take 8 mg by mouth at bedtime.     . metoprolol tartrate (LOPRESSOR) 25 MG tablet Take 25 mg by mouth 2 (two) times daily.     . potassium citrate (UROCIT-K) 10 MEQ (1080 MG) SR tablet Take 2 tablets (20 mEq total) by mouth 2 (two) times daily. 120 tablet 60  . pravastatin (PRAVACHOL) 40 MG  tablet Take 40 mg by mouth every evening.     . Tamsulosin HCl (FLOMAX) 0.4 MG CAPS Take 0.4 mg by mouth 4 (four) times a week. Sundays and Thursday mornings    . valsartan-hydrochlorothiazide (DIOVAN-HCT) 320-25 MG per tablet Take 1 tablet by mouth daily.    . baclofen (LIORESAL) 10 MG tablet Take 1 tablet (10 mg total) by mouth 3 (three) times daily. (Patient not taking: Reported on 11/26/2014) 30 tablet 0  . oxyCODONE (ROXICODONE) 5 MG immediate release tablet Take 1 tablet (5 mg total) by mouth every 8 (eight) hours as needed for severe pain. Take with stool softener may cause constipation (Patient not taking: Reported on 11/26/2014) 30 tablet 0   No current facility-administered medications on file prior to visit.    Allergies  Allergen Reactions  . Other Cough    Blood pressure medication-reaction was about 18 years ago  . Pioglitazone     edema    Family History  Problem Relation Age of Onset  . Arthritis Other     Parents  . Hypertension Other     Parents  . Diabetes Other     Parents    BP 134/62 mmHg  Pulse 73  Temp(Src) 98 F (36.7 C) (Oral)  Ht 5\' 11"  (1.803 m)  Wt 254 lb (115.214 kg)  BMI 35.44 kg/m2  SpO2 98%   Review of Systems He denies hypoglycemia and weight change.     Objective:   Physical Exam VITAL SIGNS:  See vs page GENERAL: no distress Pulses: dorsalis pedis intact bilat.  Feet: no deformity. feet are of normal color and temp. Trace bilat edema, healed ulcers, and severe hyperpigmentation of the ant tibial areas. The left posterior tibial area has a healed ulcer.  Skin: no ulceron the feet.  Neuro: sensation is intact to touch on the feet.   Lab Results  Component Value Date   HGBA1C 11.1* 11/26/2014      Assessment & Plan:  DM: severe exacerbation Noncompliance with cbg recording, and with calling us if cbg's are high: I'll work around this as best I can  Patient is advised the following: Patient Instructions  check your blood sugar  once a day.  vary the time of day when you check, between before the 3 meals, and at bedtime.  also check if you have symptoms of your blood sugar being too high or too low.  please keep a record of the readings and bring it to your next appointment here.  please call us sooner if you are having low blood sugar episodes, or if it is running above 150.   blood tests are being requested for you today.  We'll contact  you with results.  Please increase levemir to 150 units each morning.  This is probably not enough, so please call if it is high, so we can increase further.    Please come back for a follow-up appointment in 3 months.

## 2014-11-26 NOTE — Patient Instructions (Addendum)
check your blood sugar once a day.  vary the time of day when you check, between before the 3 meals, and at bedtime.  also check if you have symptoms of your blood sugar being too high or too low.  please keep a record of the readings and bring it to your next appointment here.  please call us sooner if you are having low blood sugar episodes, or if it is running above 150.   blood tests are being requested for you today.  We'll contact you with results.  Please increase levemir to 150 units each morning.  This is probably not enough, so please call if it is high, so we can increase further.    Please come back for a follow-up appointment in 3 months.

## 2015-02-25 ENCOUNTER — Ambulatory Visit: Payer: BC Managed Care – PPO | Admitting: Endocrinology

## 2015-03-04 ENCOUNTER — Encounter: Payer: Self-pay | Admitting: Endocrinology

## 2015-03-04 ENCOUNTER — Ambulatory Visit (INDEPENDENT_AMBULATORY_CARE_PROVIDER_SITE_OTHER): Payer: BLUE CROSS/BLUE SHIELD | Admitting: Endocrinology

## 2015-03-04 VITALS — BP 138/70 | HR 67 | Temp 98.1°F | Ht 71.0 in | Wt 258.0 lb

## 2015-03-04 DIAGNOSIS — E1021 Type 1 diabetes mellitus with diabetic nephropathy: Secondary | ICD-10-CM | POA: Diagnosis not present

## 2015-03-04 LAB — HEMOGLOBIN A1C: Hgb A1c MFr Bld: 11 % — ABNORMAL HIGH (ref 4.6–6.5)

## 2015-03-04 MED ORDER — INSULIN DETEMIR 100 UNIT/ML FLEXPEN: 200.0000 [IU] | PEN_INJECTOR | SUBCUTANEOUS | Status: DC

## 2015-03-04 NOTE — Patient Instructions (Addendum)
check your blood sugar once a day.  vary the time of day when you check, between before the 3 meals, and at bedtime.  also check if you have symptoms of your blood sugar being too high or too low.  please keep a record of the readings and bring it to your next appointment here.  please call us sooner if you are having low blood sugar episodes, or if it is running above 150.   i don't know if you should divide the insulin up twice a day.  Checking at different times of day can tell us that.  blood tests are being requested for you today.  We'll contact you with results.  Please come back for a follow-up appointment in 3 months. Please see our dietician Antonieta Iba), on the same day.

## 2015-03-04 NOTE — Progress Notes (Signed)
Subjective:    Patient ID: Matthew Schmitt, male    DOB: May 05, 1951, 64 y.o.   MRN: [id number]  HPI Pt returns for f/u of diabetes mellitus: DM type: Insulin-requiring type 2. Dx'ed: 5188 Complications: nephropathy, leg ulcers, and CAD.  Therapy: insulin since 2015 DKA: never Severe hypoglycemia: never Pancreatitis: never Other: he is willing to take insulin, but only qd.   Interval history: He takes the levemir, 80 units qam and 70 units qpm.  no cbg record, but states cbg's are in the 200's.  he checks cbg in am only.   Past Medical History  Diagnosis Date  . Impotence of organic origin   . Mixed hyperlipidemia     slightly  . Essential hypertension, benign   . Type II or unspecified type diabetes mellitus without mention of complication, uncontrolled   . History of kidney stones   . Coronary artery disease   . Anginal pain   . RBBB   . Atrial fibrillation May 2014    "heart went to fast"  . Arthritis     everywhere  . Heart murmur     Past Surgical History  Procedure Laterality Date  . Knee surgery  1978  &  1974    BILATERAL  . Cardiac catheterization  08-17-2011  DR SPENCER TILLEY    POSSIBLE MODERATE LAD STENOSIS JUST AFTER THE BIFURCATION OF LARGE DIAGONAL BRANCH/ LVEF  40-45%/ MILD GLOBAL HYPODINESIS (CATH DONE FOR ABNORMAL STRESS TEST OF FIXED LATERAL WALL DEFECT & BIFASCICULAR BLAOCK)  . Tonsillectomy  19  (age 32)  . Cystoscopy with retrograde pyelogram, ureteroscopy and stent placement  11/03/2012    Procedure: CYSTOSCOPY WITH RETROGRADE PYELOGRAM, URETEROSCOPY AND STENT PLACEMENT;  Surgeon: Bernestine Amass, MD;  Location: [facility];  Service: Urology;  Laterality: Left;  Cystoscopy/Left Retrograde/Ureteroscopy/Holmium Laser Litho/Double J Stent  . Holmium laser application  41/66/0630    Procedure: HOLMIUM LASER APPLICATION;  Surgeon: Bernestine Amass, MD;  Location: [facility];  Service: Urology;  Laterality: Left;  .  Cardioversion N/A 04/02/2013    Procedure: CARDIOVERSION;  Surgeon: Jacolyn Reedy, MD;  Location: [facility];  Service: Cardiovascular;  Laterality: N/A;  . Pilonidal cyst excision    . Robotic assited partial nephrectomy Left 06/18/2013    Procedure: ROBOTIC ASSISTED PARTIAL NEPHRECTOMY;  Surgeon: Dutch Gray, MD;  Location: [facility];  Service: Urology;  Laterality: Left;  . Nephrolithotomy Right 05/17/2014    Procedure: NEPHROLITHOTOMY PERCUTANEOUS;  Surgeon: Bernestine Amass, MD;  Location: [facility];  Service: Urology;  Laterality: Right;    History   Social History  . Marital Status: Married    Spouse Name: N/A  . Number of Children: N/A  . Years of Education: 16   Occupational History  . Government social research officer, Psychologist, occupational    Social History Main Topics  . Smoking status: Former Smoker -- 1.00 packs/day for 6 years    Types: Cigarettes    Quit date: 10/31/1969  . Smokeless tobacco: Never Used     Comment: QUIT SMOKING IN 1970'S  . Alcohol Use: 0.0 oz/week    0 Standard drinks or equivalent per week     Comment: OCCASIONAL  . Drug Use: No  . Sexual Activity: Not on file   Other Topics Concern  . Not on file   Social History Narrative   Regular exercise-no    Current Outpatient Prescriptions on File Prior to Visit  Medication Sig Dispense Refill  . acetaminophen (TYLENOL)  500 MG tablet Take 500 mg by mouth every 6 (six) hours as needed for pain.    Marland Kitchen amLODipine (NORVASC) 5 MG tablet Take 5 mg by mouth daily. Patient takes in am    . aspirin 81 MG tablet Take 81 mg by mouth daily.    . baclofen (LIORESAL) 10 MG tablet Take 1 tablet (10 mg total) by mouth 3 (three) times daily. 30 tablet 0  . doxazosin (CARDURA) 8 MG tablet Take 8 mg by mouth at bedtime.     Marland Kitchen glucose blood (ONE TOUCH ULTRA TEST) test strip 1 each by Other route 2 (two) times daily. And lancets 2/day 200 each 12  . metoprolol tartrate (LOPRESSOR) 25 MG tablet Take 25 mg by mouth 2 (two) times daily.     . potassium  citrate (UROCIT-K) 10 MEQ (1080 MG) SR tablet Take 2 tablets (20 mEq total) by mouth 2 (two) times daily. 120 tablet 60  . pravastatin (PRAVACHOL) 40 MG tablet Take 40 mg by mouth every evening.     . Tamsulosin HCl (FLOMAX) 0.4 MG CAPS Take 0.4 mg by mouth 4 (four) times a week. Sundays and Thursday mornings    . valsartan-hydrochlorothiazide (DIOVAN-HCT) 320-25 MG per tablet Take 1 tablet by mouth daily.     No current facility-administered medications on file prior to visit.    Allergies  Allergen Reactions  . Other Cough    Blood pressure medication-reaction was about 18 years ago  . Pioglitazone     edema    Family History  Problem Relation Age of Onset  . Arthritis Other     Parents  . Hypertension Other     Parents  . Diabetes Other     Parents    BP 138/70 mmHg  Pulse 67  Temp(Src) 98.1 F (36.7 C) (Oral)  Ht 5\' 11"  (1.803 m)  Wt 258 lb (117.028 kg)  BMI 36.00 kg/m2  SpO2 97%    Review of Systems He denies hypoglycemia and weight change.     Objective:   Physical Exam VITAL SIGNS:  See vs page GENERAL: no distress Pulses: dorsalis pedis intact bilat.  Feet: no deformity. feet are of normal color and temp. Trace bilat edema, healed ulcers, and severe hyperpigmentation of the ant tibial areas. The left posterior tibial area has a healed ulcer.  Skin: no ulceron the feet.  Neuro: sensation is intact to touch on the feet.   Lab Results  Component Value Date   HGBA1C 11.0* 03/04/2015        Assessment & Plan:  DM: ongoing poor control Obesity, persistent Noncompliance with insulin dosing, persistent  Patient is advised the following: Patient Instructions  check your blood sugar once a day.  vary the time of day when you check, between before the 3 meals, and at bedtime.  also check if you have symptoms of your blood sugar being too high or too low.  please keep a record of the readings and bring it to your next appointment here.  please call us  sooner if you are having low blood sugar episodes, or if it is running above 150.   i don't know if you should divide the insulin up twice a day.  Checking at different times of day can tell us that.  blood tests are being requested for you today.  We'll contact you with results.  Please come back for a follow-up appointment in 3 months. Please see our dietician Antonieta Iba), on the same  day.

## 2015-04-20 HISTORY — PX: MELANOMA EXCISION: SHX5266

## 2015-04-21 ENCOUNTER — Other Ambulatory Visit: Payer: Self-pay | Admitting: Urology

## 2015-05-13 ENCOUNTER — Encounter (HOSPITAL_BASED_OUTPATIENT_CLINIC_OR_DEPARTMENT_OTHER): Payer: Self-pay | Admitting: *Deleted

## 2015-05-16 ENCOUNTER — Encounter (HOSPITAL_BASED_OUTPATIENT_CLINIC_OR_DEPARTMENT_OTHER): Payer: Self-pay | Admitting: *Deleted

## 2015-05-16 NOTE — Progress Notes (Addendum)
NPO AFTER MN.  ARRIVE AT 5053.  NEEDS ISTAT.  CURRENT EKG AND LOV NOTE TO BE FAXED FROM DR Wynonia Lawman.  WILL TAKE NORVASC AND METOPROLOL AM DOS W/ SIPS OF WATER.   RECEIVED NOTE AND EKG FROM DR Wynonia Lawman.  PT WILL NEED EKG, THIS IS OUT OF DATE.

## 2015-05-17 NOTE — Anesthesia Preprocedure Evaluation (Addendum)
Anesthesia Evaluation  Patient identified by MRN, date of birth, ID band Patient awake    Reviewed: Allergy & Precautions, H&P , NPO status , Patient's Chart, lab work & pertinent test results  Airway Mallampati: II  TM Distance: >3 FB Neck ROM: Full    Dental no notable dental hx. (+) Dental Advisory Given, Teeth Intact   Pulmonary neg pulmonary ROS, former smoker,  breath sounds clear to auscultation  Pulmonary exam normal       Cardiovascular hypertension, Pt. on medications + angina with exertion + CAD Normal cardiovascular exam+ dysrhythmias Atrial Fibrillation + Valvular Problems/Murmurs (moderate AI) AI Rhythm:Regular Rate:Normal  Bifascicular block   Neuro/Psych negative neurological ROS  negative psych ROS   GI/Hepatic negative GI ROS, Neg liver ROS,   Endo/Other  diabetes, Well Controlled, Type 2, Oral Hypoglycemic Agents  Renal/GU negative Renal ROS  negative genitourinary   Musculoskeletal negative musculoskeletal ROS (+)   Abdominal   Peds negative pediatric ROS (+)  Hematology negative hematology ROS (+)   Anesthesia Other Findings   Reproductive/Obstetrics negative OB ROS                            Anesthesia Physical Anesthesia Plan  ASA: III  Anesthesia Plan: General   Post-op Pain Management:    Induction: Intravenous  Airway Management Planned: LMA  Additional Equipment:   Intra-op Plan:   Post-operative Plan:   Informed Consent:   Plan Discussed with: Surgeon  Anesthesia Plan Comments:         Anesthesia Quick Evaluation

## 2015-05-18 ENCOUNTER — Ambulatory Visit (HOSPITAL_BASED_OUTPATIENT_CLINIC_OR_DEPARTMENT_OTHER): Payer: BLUE CROSS/BLUE SHIELD | Admitting: Anesthesiology

## 2015-05-18 ENCOUNTER — Encounter (HOSPITAL_BASED_OUTPATIENT_CLINIC_OR_DEPARTMENT_OTHER)
Admission: RE | Disposition: A | Discharge status: Home / Self Care | Payer: Self-pay | Source: Ambulatory Visit | Attending: Urology

## 2015-05-18 ENCOUNTER — Ambulatory Visit (HOSPITAL_BASED_OUTPATIENT_CLINIC_OR_DEPARTMENT_OTHER)
Admission: RE | Admit: 2015-05-18 | Discharge: 2015-05-18 | Disposition: A | Discharge status: Home / Self Care | Payer: BLUE CROSS/BLUE SHIELD | Source: Ambulatory Visit | Attending: Urology | Admitting: Urology

## 2015-05-18 ENCOUNTER — Other Ambulatory Visit: Payer: Self-pay | Admitting: Urology

## 2015-05-18 ENCOUNTER — Encounter (HOSPITAL_BASED_OUTPATIENT_CLINIC_OR_DEPARTMENT_OTHER): Payer: Self-pay

## 2015-05-18 DIAGNOSIS — E119 Type 2 diabetes mellitus without complications: Secondary | ICD-10-CM | POA: Diagnosis not present

## 2015-05-18 DIAGNOSIS — Z87891 Personal history of nicotine dependence: Secondary | ICD-10-CM | POA: Insufficient documentation

## 2015-05-18 DIAGNOSIS — I10 Essential (primary) hypertension: Secondary | ICD-10-CM | POA: Insufficient documentation

## 2015-05-18 DIAGNOSIS — Z87442 Personal history of urinary calculi: Secondary | ICD-10-CM | POA: Diagnosis not present

## 2015-05-18 DIAGNOSIS — Z7982 Long term (current) use of aspirin: Secondary | ICD-10-CM | POA: Diagnosis not present

## 2015-05-18 DIAGNOSIS — N210 Calculus in bladder: Secondary | ICD-10-CM | POA: Diagnosis not present

## 2015-05-18 DIAGNOSIS — I4891 Unspecified atrial fibrillation: Secondary | ICD-10-CM | POA: Diagnosis not present

## 2015-05-18 DIAGNOSIS — N200 Calculus of kidney: Secondary | ICD-10-CM | POA: Insufficient documentation

## 2015-05-18 DIAGNOSIS — N400 Enlarged prostate without lower urinary tract symptoms: Secondary | ICD-10-CM | POA: Diagnosis not present

## 2015-05-18 DIAGNOSIS — M1990 Unspecified osteoarthritis, unspecified site: Secondary | ICD-10-CM | POA: Diagnosis not present

## 2015-05-18 DIAGNOSIS — Z79899 Other long term (current) drug therapy: Secondary | ICD-10-CM | POA: Insufficient documentation

## 2015-05-18 HISTORY — DX: Other specified health status: Z78.9

## 2015-05-18 HISTORY — DX: Presence of spectacles and contact lenses: Z97.3

## 2015-05-18 HISTORY — PX: HOLMIUM LASER APPLICATION: SHX5852

## 2015-05-18 HISTORY — DX: Other specified postprocedural states: Z98.890

## 2015-05-18 HISTORY — DX: Congenital multiple renal cysts: Q61.02

## 2015-05-18 HISTORY — DX: Unspecified symptoms and signs involving the genitourinary system: R39.9

## 2015-05-18 HISTORY — DX: Atrioventricular block, first degree: I44.0

## 2015-05-18 HISTORY — DX: Type 2 diabetes mellitus without complications: E11.9

## 2015-05-18 HISTORY — DX: Personal history of malignant melanoma of skin: Z85.820

## 2015-05-18 HISTORY — DX: Paroxysmal atrial fibrillation: I48.0

## 2015-05-18 HISTORY — DX: Calculus in bladder: N21.0

## 2015-05-18 HISTORY — PX: CYSTOSCOPY WITH LITHOLAPAXY: SHX1425

## 2015-05-18 LAB — POCT I-STAT 4, (NA,K, GLUC, HGB,HCT)
Glucose, Bld: 262 mg/dL — ABNORMAL HIGH (ref 65–99)
HCT: 43 % (ref 39.0–52.0)
Hemoglobin: 14.6 g/dL (ref 13.0–17.0)
Potassium: 3.7 mmol/L (ref 3.5–5.1)
Sodium: 140 mmol/L (ref 135–145)

## 2015-05-18 LAB — GLUCOSE, CAPILLARY: Glucose-Capillary: 227 mg/dL — ABNORMAL HIGH (ref 65–99)

## 2015-05-18 SURGERY — CYSTOSCOPY, WITH BLADDER CALCULUS LITHOLAPAXY
Anesthesia: General | Site: Bladder

## 2015-05-18 MED ORDER — LACTATED RINGERS IV SOLN
INTRAVENOUS | Status: DC
Start: 2015-05-18 — End: 2015-05-18
  Administered 2015-05-18: 11:00:00 via INTRAVENOUS
  Filled 2015-05-18: qty 1000

## 2015-05-18 MED ORDER — PHENAZOPYRIDINE HCL 100 MG PO TABS: 100.0000 mg | ORAL_TABLET | Freq: Three times a day (TID) | ORAL | Status: DC | PRN

## 2015-05-18 MED ORDER — LIDOCAINE HCL (CARDIAC) 20 MG/ML IV SOLN
INTRAVENOUS | Status: DC | PRN
  Administered 2015-05-18: 100 mg via INTRAVENOUS

## 2015-05-18 MED ORDER — PHENAZOPYRIDINE HCL 200 MG PO TABS
200.0000 mg | ORAL_TABLET | Freq: Three times a day (TID) | ORAL | Status: DC
  Administered 2015-05-18: 200 mg via ORAL
  Filled 2015-05-18: qty 1

## 2015-05-18 MED ORDER — PHENYLEPHRINE HCL 10 MG/ML IJ SOLN
INTRAMUSCULAR | Status: DC | PRN
  Administered 2015-05-18 (×2): 40 ug via INTRAVENOUS

## 2015-05-18 MED ORDER — FENTANYL CITRATE (PF) 100 MCG/2ML IJ SOLN
INTRAMUSCULAR | Status: AC
  Filled 2015-05-18: qty 4

## 2015-05-18 MED ORDER — PROPOFOL 10 MG/ML IV BOLUS
INTRAVENOUS | Status: DC | PRN
  Administered 2015-05-18: 200 mg via INTRAVENOUS

## 2015-05-18 MED ORDER — MIDAZOLAM HCL 5 MG/5ML IJ SOLN
INTRAMUSCULAR | Status: DC | PRN
  Administered 2015-05-18: 2 mg via INTRAVENOUS

## 2015-05-18 MED ORDER — PHENAZOPYRIDINE HCL 100 MG PO TABS
100.0000 mg | ORAL_TABLET | Freq: Three times a day (TID) | ORAL | Status: DC
  Filled 2015-05-18: qty 1

## 2015-05-18 MED ORDER — FENTANYL CITRATE (PF) 100 MCG/2ML IJ SOLN
INTRAMUSCULAR | Status: DC | PRN
  Administered 2015-05-18: 50 ug via INTRAVENOUS
  Administered 2015-05-18 (×2): 25 ug via INTRAVENOUS

## 2015-05-18 MED ORDER — PHENAZOPYRIDINE HCL 100 MG PO TABS
ORAL_TABLET | ORAL | Status: AC
  Filled 2015-05-18: qty 2

## 2015-05-18 MED ORDER — MIDAZOLAM HCL 2 MG/2ML IJ SOLN
INTRAMUSCULAR | Status: AC
  Filled 2015-05-18: qty 2

## 2015-05-18 MED ORDER — ONDANSETRON HCL 4 MG/2ML IJ SOLN
INTRAMUSCULAR | Status: DC | PRN
  Administered 2015-05-18: 4 mg via INTRAVENOUS

## 2015-05-18 MED ORDER — DEXAMETHASONE SODIUM PHOSPHATE 4 MG/ML IJ SOLN
INTRAMUSCULAR | Status: DC | PRN
  Administered 2015-05-18: 4 mg via INTRAVENOUS

## 2015-05-18 MED ORDER — LIDOCAINE HCL 2 % EX GEL
CUTANEOUS | Status: DC | PRN
  Administered 2015-05-18: 1 via URETHRAL

## 2015-05-18 MED ORDER — CEFAZOLIN SODIUM-DEXTROSE 2-3 GM-% IV SOLR
2.0000 g | INTRAVENOUS | Status: AC
  Administered 2015-05-18: 2 g via INTRAVENOUS
  Filled 2015-05-18: qty 50

## 2015-05-18 MED ORDER — WATER FOR IRRIGATION, STERILE IR SOLN
Status: DC | PRN
Start: 2015-05-18 — End: 2015-05-18
  Administered 2015-05-18 (×2): 3000 mL via INTRAVESICAL

## 2015-05-18 MED ORDER — LACTATED RINGERS IV SOLN
INTRAVENOUS | Status: DC
  Administered 2015-05-18: 09:00:00 via INTRAVENOUS
  Filled 2015-05-18: qty 1000

## 2015-05-18 MED ORDER — METOPROLOL TARTRATE 1 MG/ML IV SOLN
INTRAVENOUS | Status: DC | PRN
  Administered 2015-05-18: 1 mg via INTRAVENOUS

## 2015-05-18 MED ORDER — CEFAZOLIN SODIUM-DEXTROSE 2-3 GM-% IV SOLR
INTRAVENOUS | Status: AC
  Filled 2015-05-18: qty 50

## 2015-05-18 MED ORDER — FENTANYL CITRATE (PF) 100 MCG/2ML IJ SOLN
25.0000 ug | INTRAMUSCULAR | Status: DC | PRN
  Filled 2015-05-18: qty 1

## 2015-05-18 MED ORDER — METOCLOPRAMIDE HCL 5 MG/ML IJ SOLN
INTRAMUSCULAR | Status: DC | PRN
  Administered 2015-05-18: 5 mg via INTRAVENOUS

## 2015-05-18 MED ORDER — INSULIN ASPART 100 UNIT/ML ~~LOC~~ SOLN
0.0000 [IU] | SUBCUTANEOUS | Status: DC
  Administered 2015-05-18: 8 [IU] via SUBCUTANEOUS
  Filled 2015-05-18: qty 0.15

## 2015-05-18 SURGICAL SUPPLY — 15 items
BAG DRAIN URO-CYSTO SKYTR STRL (DRAIN) ×2 IMPLANT
BAG DRN UROCATH (DRAIN) ×1
CLOTH BEACON ORANGE TIMEOUT ST (SAFETY) ×2 IMPLANT
FIBER LASER FLEXIVA 1000 (UROLOGICAL SUPPLIES) ×1 IMPLANT
FIBER LASER FLEXIVA 550 (UROLOGICAL SUPPLIES) IMPLANT
GLOVE BIO SURGEON STRL SZ7 (GLOVE) ×1 IMPLANT
GLOVE BIO SURGEON STRL SZ7.5 (GLOVE) ×2 IMPLANT
GLOVE BIOGEL PI IND STRL 7.5 (GLOVE) IMPLANT
GLOVE BIOGEL PI INDICATOR 7.5 (GLOVE) ×1
GOWN STRL REUS W/ TWL XL LVL3 (GOWN DISPOSABLE) ×1 IMPLANT
GOWN STRL REUS W/TWL XL LVL3 (GOWN DISPOSABLE) ×6
MANIFOLD NEPTUNE II (INSTRUMENTS) ×1 IMPLANT
PACK CYSTO (CUSTOM PROCEDURE TRAY) ×2 IMPLANT
SYRINGE IRR TOOMEY STRL 70CC (SYRINGE) ×1 IMPLANT
WATER STERILE IRR 3000ML UROMA (IV SOLUTION) ×2 IMPLANT

## 2015-05-18 NOTE — H&P (Signed)
Reason for visit  Cystoscopy with EHL/extraction of bladder calculi   History of Present Illness     Matthew Schmitt's  main problem over the years has been nephrolithiasis. He has had mixed stone composition with uric acid as well as calcium oxalate. He has passed a lot of fairly large stones over the years. He has required ureteroscopy and also most recently, in June 2015, underwent a percutaneous nephrolithotomy for a fairly large stone of about 3 cm in his right kidney. He is known to have some bilateral stones but has done relatively well. He tells me since I saw him, he has passed at least 10-12 relatively small stones with minimal discomfort. He has had some intermittent hematuria and is known to have a bladder calculus as well. He does experience some urinary frequency and urgency. Most of the time, his urinary stream is okay. His urinalysis today shows significant pyuria without significant hematuria. No obvious bacteriuria.   On renal ultrasonography, right kidney measures 13.0 cm. Left kidney is 12.87 cm. There is no hydronephrosis bilaterally. No evidence of solid renal mass or significant cystic disease. There are several scattered echogenic foci through both kidneys. These are consistent with stones. The largest of these appears to be 5-6 mm in size. There are 2-3 discrete stones in the right kidney and several on the left.   On KUB imaging, there is a large calcification noted in the bladder measuring approximately 15 mm consistent with a known bladder stone that has increased in size.       Past Medical History Problems  1. History of Arthritis 2. History of Atrial fibrillation (I48.91) 3. History of diabetes mellitus (Z86.39) 4. History of hypertension (Z86.79)  Surgical History Problems  1. History of Cystoscopy With Insertion Of Ureteral Stent Left 2. History of Cystoscopy With Ureteroscopy With Lithotripsy 3. History of Kidney Surgery Laparoscopic Partial Nephrectomy 4.  History of Knee Surgery 5. History of Percutaneous Lithotomy For Stone Over 2cm. 6. History of Procedures Of The Spine 7. History of Tonsillectomy  Current Meds 1. Adult Aspirin Low Strength 81 MG CHEW;  Therapy: (Recorded:03Feb2015) to Recorded 2. AmLODIPine Besylate 5 MG Oral Tablet;  Therapy: (Recorded:02Jul2015) to Recorded 3. Diovan HCT 320-25 MG Oral Tablet;  Therapy: (Recorded:29Apr2016) to Recorded 4. Doxazosin Mesylate 8 MG Oral Tablet;  Therapy: [id number] to Recorded 5. Levemir FlexPen 100 UNIT/ML SOLN;  Therapy: (Recorded:02Jul2015) to Recorded 6. Metoprolol Tartrate 50 MG Oral Tablet;  Therapy: (Recorded:29Apr2016) to Recorded 7. Potassium Citrate ER 10 MEQ (1080 MG) Oral Tablet Extended Release;  Therapy: (Recorded:20Oct2015) to Recorded 8. Pravastatin Sodium 40 MG Oral Tablet;  Therapy: (Recorded:02Jul2015) to Recorded 9. Tamsulosin HCl - 0.4 MG Oral Capsule; TAKE ONE CAPSULE BY MOUTH EVERY DAY;  Therapy: 27Dec2011 to (Evaluate:12Jul2016)  Requested for: [id number]; Last  Rx:14Jan2016 Ordered  Allergies Medication  1. No Known Drug Allergies  Family History Problems  1. Family history of diabetes mellitus (Z83.3) : Mother, Father 2. Family history of hypertension (Z82.49) : Mother 3. Denied: Family history of Kidney Cancer  Social History Problems  1. Alcohol Use   2 beer a month 2. Former Smoker   nonsmoker for the past 20-64yrs 3. Marital History - Currently Married 4. Occupation:   Film/video editor  Review of Systems  Genitourinary: urinary frequency, feelings of urinary urgency, weak urinary stream and hematuria, but no dysuria and urinary stream does not start and stop.  Gastrointestinal: heartburn, but no flank pain and no abdominal pain.  Constitutional: feeling tired (fatigue).  Cardiovascular: leg swelling.  Musculoskeletal: back pain, joint pain, joint swelling and joint stiffness.    Vitals   Blood Pressure: 171 / 77 Temperature: 98.3  F Heart Rate: 73  Physical Exam Constitutional: Well nourished and well developed . No acute distress.  Neck: The appearance of the neck is normal and no neck mass is present.  Pulmonary: No respiratory distress and normal respiratory rhythm and effort.  Cardiovascular: Heart rate and rhythm are normal . No peripheral edema.   Abdomen: The abdomen is soft and nontender. No masses are palpated. No CVA tenderness. No hernias are palpable. No hepatosplenomegaly noted.  Skin: Normal skin turgor, no visible rash and no visible skin lesions.  Neuro/Psych:. Mood and affect are appropriate.   Results/Data   UA With REFLEX    COLOR YELLOW     APPEARANCE CLOUDY     SPECIFIC GRAVITY 1.025     pH 5.5     GLUCOSE NEG     BILIRUBIN NEG     KETONE NEG     BLOOD SMALL     PROTEIN TRACE     UROBILINOGEN 0.2     NITRITE POS     LEUKOCYTE ESTERASE MOD     SQUAMOUS EPITHELIAL/HPF RARE     WBC TNTC     CRYSTALS NONE SEEN     CASTS NONE SEEN     RBC 3-6     BACTERIA RARE     Assessment Assessed  1. Benign prostatic hyperplasia with urinary obstruction (N40.1,N13.8) 2. Increased urinary frequency (R35.0) 3. Bladder calculus (N21.0) 4. Nephrolithiasis (N20.0) 5. Pyuria (N39.0)  Plan  Benign prostatic hyperplasia with urinary obstruction and bladder calculus    Discussion/Summary All issues were discussed with Shanon Brow. He clearly has an abnormal urinalysis with quite a bit of inflammatory cells. I suspect this is secondary to the large bladder stone, and that is also almost certainly the cause for his intermittent hematuria. His stone has increased in size. I do think that this is something that should be treated, although it is certainly not urgent. I have suggested we consider cystoscopy with laser lithotripsy of the bladder stone and removal of the fragments. He is interested in potentially delaying this several months, which I do not think is going to be a problem. He is contemplating the  potential need for knee surgery, and I do think it would be prudent to clear up the bladder stone and any possible urinary tract infection prior to embarking on the orthopedic procedure with hardware. He does have multiple bilateral renal calculi but nothing that appears particularly large. He had a track record of passing stones; and, therefore, these stones do not need to be actively treated at this time. He remains on potassium citrate. He also remains on tamsulosin. His overall voiding is otherwise unchanged other than some increase in frequency and urgency which again may very well be related to the stone.

## 2015-05-18 NOTE — Op Note (Signed)
Post-operative Diagnosis:  1. Cystolithiasis 2. History of Nephrolithiasis 3. BPH with obstruction and LUTS  Procedure and Anesthesia:  Procedure(s) and Anesthesia Type:    * CYSTOSCOPY WITH LITHOLAPAXY for stone <2 cm - General     * HOLMIUM LASER APPLICATION - General  Surgeon: Surgeon(s) and Role:    * Rana Snare, MD - Primary   Resident:  Star Age  EBL: 5 mL  IVF: See anesthesia record  Drains: none  Implants: none  Specimens: Stone for chemical analysis  Complications: * No complications entered in OR log *  Indications for Surgery: 64 y.o. male with history of nephrolithiasis, BPH and known cystolithiasis. He is being taken to the operating room for cystolitholapaxy. Risks, benefits, and alternatives of the above procedure were discussed previously in detail and informed consents was signed and verified.  Findings:  -Long prostate with high riding bladder neck and significant median bar component -1.5 cm stone sitting at the trigone dusted to fragments without residual pieces -Bilateral ureteral orifices visualized pre and post procedurally  Procedure Details: The patient and consent was verified in the pre-op holding area and brought to the operating room where they were placed on the operating table. Pre-induction time out was called and general anesthesia was induced. SCDs were placed and IV antibiotics were started.  A 22.5 Fr rigid cystoscope was passed gently per urethra into the bladder with copious lubrication and normal saline running. Cystoscopic findings were as documented above. After visualizing the stone we advanced a 1000 micron laser fiber through the working channel and dusted the stone to ~1 mm fragments using laser settings of 0.5 J and 50 hz. Once the stone fragments were sufficiently small, we irrigated the fragments from the bladder using simple filling and drainage as well as irrigation with a Toomey syringe. Upon final inspection, the bladder  was empty of all stone fragments. The bladder was emptied and all instrumentation was removed. 1% Lidocaine jelly was instilled per urethra. Anesthesia was reversed and the patient awoke having tolerated the procedure well. He was taken to the PACU in stable condition.  Post-operative Plan: 1. Discharge home per PACU protocol 2. Follow-up as planned in 6 months   Teaching Physician Attestation: Dr. Risa Grill was present and scrubbed for the entirety of the procedure  Pietro Cassis. Ottis Stain, MD Resident [facility] Department of Urologic Surgery/[facility]

## 2015-05-18 NOTE — Anesthesia Postprocedure Evaluation (Signed)
  Anesthesia Post-op Note  Patient: Matthew Schmitt  Procedure(s) Performed: Procedure(s) (LRB): CYSTOSCOPY WITH LITHOLAPAXY (N/A) HOLMIUM LASER APPLICATION (N/A)  Patient Location: PACU  Anesthesia Type: General  Level of Consciousness: awake and alert   Airway and Oxygen Therapy: Patient Spontanous Breathing  Post-op Pain: mild  Post-op Assessment: Post-op Vital signs reviewed, Patient's Cardiovascular Status Stable, Respiratory Function Stable, Patent Airway and No signs of Nausea or vomiting  Last Vitals:  Filed Vitals:   05/18/15 1045  BP: 142/73  Pulse: 69  Temp:   Resp: 12    Post-op Vital Signs: stable   Complications: No apparent anesthesia complications

## 2015-05-18 NOTE — Transfer of Care (Signed)
Last Vitals:  Filed Vitals:   05/18/15 0832  BP: 158/71  Pulse: 73  Temp: 36.9 C  Resp: 16    Immediate Anesthesia Transfer of Care Note  Patient: Matthew Schmitt  Procedure(s) Performed: Procedure(s) (LRB): CYSTOSCOPY WITH LITHOLAPAXY (N/A) HOLMIUM LASER APPLICATION (N/A)  Patient Location: PACU  Anesthesia Type: General  Level of Consciousness: awake, alert  and oriented  Airway & Oxygen Therapy: Patient Spontanous Breathing and Patient connected to face mask oxygen  Post-op Assessment: Report given to PACU RN and Post -op Vital signs reviewed and stable  Post vital signs: Reviewed and stable  Complications: No apparent anesthesia complications

## 2015-05-18 NOTE — Anesthesia Procedure Notes (Signed)
Procedure Name: LMA Insertion Date/Time: 05/18/2015 9:47 AM Performed by: Mechele Claude Pre-anesthesia Checklist: Patient identified, Emergency Drugs available, Suction available and Patient being monitored Patient Re-evaluated:Patient Re-evaluated prior to inductionOxygen Delivery Method: Circle System Utilized Preoxygenation: Pre-oxygenation with 100% oxygen Intubation Type: IV induction Ventilation: Mask ventilation without difficulty LMA: LMA inserted LMA Size: 5.0 Number of attempts: 1 Airway Equipment and Method: bite block Placement Confirmation: positive ETCO2 Tube secured with: Tape Dental Injury: Teeth and Oropharynx as per pre-operative assessment

## 2015-05-18 NOTE — Discharge Instructions (Signed)
CYSTOSCOPY HOME CARE INSTRUCTIONS ° °Activity: °Rest for the remainder of the day.  Do not drive or operate equipment today.  You may resume normal activities in one to two days as instructed by your physician.  ° °Meals: °Drink plenty of liquids and eat light foods such as gelatin or soup this evening.  You may return to a normal meal plan tomorrow. ° °Return to Work: °You may return to work in one to two days or as instructed by your physician. ° °Special Instructions / Symptoms: °Call your physician if any of these symptoms occur: ° ° -persistent or heavy bleeding ° -bleeding which continues after first few urination ° -large blood clots that are difficult to pass ° -urine stream diminishes or stops completely ° -fever equal to or higher than 101 degrees Farenheit. ° -cloudy urine with a strong, foul odor ° -severe pain ° °Females should always wipe from front to back after elimination.  You may feel some burning pain when you urinate.  This should disappear with time.  Applying moist heat to the lower abdomen or a hot tub bath may help relieve the pain.  ° °Follow-Up / Date of Return Visit to Your Physician: Call for an appointment to arrange follow-up. ° ° ° ° ° °Post Anesthesia Home Care Instructions ° °Activity: °Get plenty of rest for the remainder of the day. A responsible adult should stay with you for 24 hours following the procedure.  °For the next 24 hours, DO NOT: °-Drive a car °-Operate machinery °-Drink alcoholic beverages °-Take any medication unless instructed by your physician °-Make any legal decisions or sign important papers. ° °Meals: °Start with liquid foods such as gelatin or soup. Progress to regular foods as tolerated. Avoid greasy, spicy, heavy foods. If nausea and/or vomiting occur, drink only clear liquids until the nausea and/or vomiting subsides. Call your physician if vomiting continues. ° °Special Instructions/Symptoms: °Your throat may feel dry or sore from the anesthesia or the  breathing tube placed in your throat during surgery. If this causes discomfort, gargle with warm salt water. The discomfort should disappear within 24 hours. ° °If you had a scopolamine patch placed behind your ear for the management of post- operative nausea and/or vomiting: ° °1. The medication in the patch is effective for 72 hours, after which it should be removed.  Wrap patch in a tissue and discard in the trash. Wash hands thoroughly with soap and water. °2. You may remove the patch earlier than 72 hours if you experience unpleasant side effects which may include dry mouth, dizziness or visual disturbances. °3. Avoid touching the patch. Wash your hands with soap and water after contact with the patch. °  ° °

## 2015-05-19 ENCOUNTER — Encounter (HOSPITAL_BASED_OUTPATIENT_CLINIC_OR_DEPARTMENT_OTHER): Payer: Self-pay | Admitting: Urology

## 2015-06-03 ENCOUNTER — Encounter: Payer: Self-pay | Admitting: Endocrinology

## 2015-06-03 ENCOUNTER — Encounter: Payer: BC Managed Care – PPO | Admitting: Dietician

## 2015-06-03 ENCOUNTER — Ambulatory Visit (INDEPENDENT_AMBULATORY_CARE_PROVIDER_SITE_OTHER): Payer: BLUE CROSS/BLUE SHIELD | Admitting: Endocrinology

## 2015-06-03 VITALS — BP 138/72 | HR 67 | Temp 98.1°F | Ht 71.0 in | Wt 254.0 lb

## 2015-06-03 DIAGNOSIS — E1021 Type 1 diabetes mellitus with diabetic nephropathy: Secondary | ICD-10-CM | POA: Diagnosis not present

## 2015-06-03 LAB — POCT GLYCOSYLATED HEMOGLOBIN (HGB A1C): Hemoglobin A1C: 9.2

## 2015-06-03 MED ORDER — INSULIN DETEMIR 100 UNIT/ML FLEXPEN: 250.0000 [IU] | PEN_INJECTOR | SUBCUTANEOUS | Status: DC

## 2015-06-03 NOTE — Patient Instructions (Addendum)
check your blood sugar once a day.  vary the time of day when you check, between before the 3 meals, and at bedtime.  also check if you have symptoms of your blood sugar being too high or too low.  please keep a record of the readings and bring it to your next appointment here.  please call us sooner if you are having low blood sugar episodes, or if it is running above 150.   Keep the ulcerated areas on your legs covered with antibiotic ointment and large bandaids. Please increase the insulin to 250 units each morning.  Please come back for a follow-up appointment in 2-3 months.

## 2015-06-03 NOTE — Progress Notes (Signed)
Subjective:    Patient ID: Matthew Schmitt, male    DOB: 08/14/1951, 64 y.o.   MRN: [id number]  HPI Pt returns for f/u of diabetes mellitus: DM type: Insulin-requiring type 2. Dx'ed: 2002.   Complications: nephropathy, leg ulcers, and CAD.  Therapy: insulin since 2015.   DKA: never.   Severe hypoglycemia: never.  Pancreatitis: never.   Other: he has chosen a simple insulin regimen.     Interval history: He takes the levemir, 200 units.  no cbg record, but states cbg's are approx 200.  He checks in am only.   Past Medical History  Diagnosis Date  . Impotence of organic origin   . Mixed hyperlipidemia     slightly  . Essential hypertension, benign   . History of kidney stones   . RBBB   . Arthritis     everywhere  . Heart murmur   . Bladder calculus   . Type 2 diabetes mellitus   . PAF (paroxysmal atrial fibrillation)   . Coronary artery disease     cardiologist-  dr Wynonia Lawman  . First degree heart block   . History of melanoma excision     June 2016  --- s/p  excision bilateral leg   . Lower urinary tract symptoms (LUTS)   . Presence of surgical incision     x2  bilateral leg and right thigh  . Wears glasses   . Multiple renal cysts     bilateral  . History of renal cell carcinoma     s/p  left partial nephrectomy    Past Surgical History  Procedure Laterality Date  . Knee surgery Bilateral 1978  &  1974  . Cardiac catheterization  08-17-2011  DR SPENCER TILLEY    POSSIBLE MODERATE LAD STENOSIS JUST AFTER THE BIFURCATION OF LARGE DIAGONAL BRANCH/ LVEF  40-45%/ MILD GLOBAL HYPODINESIS (CATH DONE FOR ABNORMAL STRESS TEST OF FIXED LATERAL WALL DEFECT & BIFASCICULAR BLAOCK)  . Tonsillectomy  73  (age 1)  . Cystoscopy with retrograde pyelogram, ureteroscopy and stent placement  11/03/2012    Procedure: CYSTOSCOPY WITH RETROGRADE PYELOGRAM, URETEROSCOPY AND STENT PLACEMENT;  Surgeon: Bernestine Amass, MD;  Location: [facility];  Service: Urology;   Laterality: Left;  Cystoscopy/Left Retrograde/Ureteroscopy/Holmium Laser Litho/Double J Stent  . Holmium laser application  66/59/9357    Procedure: HOLMIUM LASER APPLICATION;  Surgeon: Bernestine Amass, MD;  Location: [facility];  Service: Urology;  Laterality: Left;  . Cardioversion N/A 04/02/2013    Procedure: CARDIOVERSION;  Surgeon: Jacolyn Reedy, MD;  Location: [place];  Service: Cardiovascular;  Laterality: N/A;  . Pilonidal cyst excision    . Robotic assited partial nephrectomy Left 06/18/2013    Procedure: ROBOTIC ASSISTED PARTIAL NEPHRECTOMY;  Surgeon: Dutch Gray, MD;  Location: [facility];  Service: Urology;  Laterality: Left;  . Nephrolithotomy Right 05/17/2014    Procedure: NEPHROLITHOTOMY PERCUTANEOUS;  Surgeon: Bernestine Amass, MD;  Location: [facility];  Service: Urology;  Laterality: Right;  . Melanoma excision Bilateral June 2016    20th-- left leg //  1st-- right leg w/ skin graft from right thigh (no lymph node bx)  . Cystoscopy with litholapaxy N/A 05/18/2015    Procedure: CYSTOSCOPY WITH LITHOLAPAXY;  Surgeon: Rana Snare, MD;  Location: [facility];  Service: Urology;  Laterality: N/A;  . Holmium laser application N/A 0/17/7939    Procedure: HOLMIUM LASER APPLICATION;  Surgeon: Rana Snare, MD;  Location: [facility];  Service: Urology;  Laterality: N/A;    History   Social History  . Marital Status: Married    Spouse Name: N/A  . Number of Children: N/A  . Years of Education: 16   Occupational History  . Government social research officer, Psychologist, occupational    Social History Main Topics  . Smoking status: Former Smoker -- 1.00 packs/day for 6 years    Types: Cigarettes    Quit date: 10/31/1969  . Smokeless tobacco: Never Used  . Alcohol Use: 0.0 oz/week    0 Standard drinks or equivalent per week     Comment: OCCASIONAL  . Drug Use: No  . Sexual Activity: Not on file   Other Topics Concern  . Not on file   Social History Narrative    Regular exercise-no    Current Outpatient Prescriptions on File Prior to Visit  Medication Sig Dispense Refill  . acetaminophen (TYLENOL) 500 MG tablet Take 500 mg by mouth every 6 (six) hours as needed for pain.    Marland Kitchen amLODipine (NORVASC) 5 MG tablet Take 5 mg by mouth every morning. Patient takes in am    . aspirin 81 MG tablet Take 81 mg by mouth daily.    Marland Kitchen doxazosin (CARDURA) 8 MG tablet Take 8 mg by mouth at bedtime.     . metoprolol tartrate (LOPRESSOR) 25 MG tablet Take 25 mg by mouth 2 (two) times daily.     . phenazopyridine (PYRIDIUM) 100 MG tablet Take 1 tablet (100 mg total) by mouth 3 (three) times daily as needed for pain. 20 tablet 0  . potassium citrate (UROCIT-K) 10 MEQ (1080 MG) SR tablet Take 2 tablets (20 mEq total) by mouth 2 (two) times daily. (Patient taking differently: Take 20 mEq by mouth daily. ) 120 tablet 60  . pravastatin (PRAVACHOL) 40 MG tablet Take 40 mg by mouth every evening.     . Tamsulosin HCl (FLOMAX) 0.4 MG CAPS Take 0.4 mg by mouth as directed. [place], Wednesday , and Friday  PRN    . valsartan-hydrochlorothiazide (DIOVAN-HCT) 320-25 MG per tablet Take 1 tablet by mouth every evening.      No current facility-administered medications on file prior to visit.    Allergies  Allergen Reactions  . Pioglitazone Swelling    edema    Family History  Problem Relation Age of Onset  . Arthritis Other     Parents  . Hypertension Other     Parents  . Diabetes Other     Parents    BP 138/72 mmHg  Pulse 67  Temp(Src) 98.1 F (36.7 C) (Oral)  Ht 5\' 11"  (1.803 m)  Wt 254 lb (115.214 kg)  BMI 35.44 kg/m2  SpO2 97%  Review of Systems He denies hypoglycemia and weight change.      Objective:   Physical Exam VITAL SIGNS:  See vs page GENERAL: no distress Pulses: dorsalis pedis intact bilat.  Feet: no deformity. feet are of normal color and temp. Trace bilat edema, healed ulcers, and severe hyperpigmentation of the ant tibial areas (he recently  had bilat melanomas removed). The left posterior tibial area has a healed ulcer.  Skin: no ulceron the feet.  Neuro: sensation is intact to touch on the feet.     A1c=9.2%    Assessment & Plan:  Obesity, worse: he declines surgery.   DM: he needs increased rx Leg ulcers, new, due to cancer surgery.    Patient is advised the following: Patient Instructions  check your blood  sugar once a day.  vary the time of day when you check, between before the 3 meals, and at bedtime.  also check if you have symptoms of your blood sugar being too high or too low.  please keep a record of the readings and bring it to your next appointment here.  please call us sooner if you are having low blood sugar episodes, or if it is running above 150.   Keep the ulcerated areas on your legs covered with antibiotic ointment and large bandaids. Please increase the insulin to 250 units each morning.  Please come back for a follow-up appointment in 2-3 months.

## 2015-09-02 ENCOUNTER — Ambulatory Visit: Payer: BC Managed Care – PPO | Admitting: Endocrinology

## 2015-09-09 ENCOUNTER — Ambulatory Visit (INDEPENDENT_AMBULATORY_CARE_PROVIDER_SITE_OTHER): Payer: Managed Care, Other (non HMO) | Admitting: Endocrinology

## 2015-09-09 ENCOUNTER — Encounter: Payer: Self-pay | Admitting: Endocrinology

## 2015-09-09 VITALS — BP 112/76 | HR 65 | Temp 98.0°F | Ht 71.0 in | Wt 255.5 lb

## 2015-09-09 DIAGNOSIS — E1021 Type 1 diabetes mellitus with diabetic nephropathy: Secondary | ICD-10-CM

## 2015-09-09 MED ORDER — INSULIN DETEMIR 100 UNIT/ML FLEXPEN: 280.0000 [IU] | PEN_INJECTOR | SUBCUTANEOUS | Status: DC

## 2015-09-09 NOTE — Progress Notes (Signed)
Subjective:    Patient ID: Matthew Schmitt, male    DOB: [birth year], 64 y.o.   MRN: [id number]  HPI Pt returns for f/u of diabetes mellitus: DM type: Insulin-requiring type 2. Dx'ed: 2002.   Complications: nephropathy, leg ulcers, and CAD.  Therapy: insulin since 2015.   DKA: never.   Severe hypoglycemia: never.  Pancreatitis: never.   Other: he has chosen a qd insulin regimen.     Interval history: He takes the levemir, 250 units.  no cbg record, but states cbg's are in the high-100's.  He checks in am only.   Past Medical History  Diagnosis Date  . Impotence of organic origin   . Mixed hyperlipidemia     slightly  . Essential hypertension, benign   . History of kidney stones   . RBBB   . Arthritis     everywhere  . Heart murmur   . Bladder calculus   . Type 2 diabetes mellitus ([place])   . PAF (paroxysmal atrial fibrillation) ([place])   . Coronary artery disease     cardiologist-  dr Wynonia Lawman  . First degree heart block   . History of melanoma excision     June 2016  --- s/p  excision bilateral leg   . Lower urinary tract symptoms (LUTS)   . Presence of surgical incision     x2  bilateral leg and right thigh  . Wears glasses   . Multiple renal cysts     bilateral  . History of renal cell carcinoma     s/p  left partial nephrectomy    Past Surgical History  Procedure Laterality Date  . Knee surgery Bilateral 1978  &  1974  . Cardiac catheterization  08-17-2011  DR SPENCER TILLEY    POSSIBLE MODERATE LAD STENOSIS JUST AFTER THE BIFURCATION OF LARGE DIAGONAL BRANCH/ LVEF  40-45%/ MILD GLOBAL HYPODINESIS (CATH DONE FOR ABNORMAL STRESS TEST OF FIXED LATERAL WALL DEFECT & BIFASCICULAR BLAOCK)  . Tonsillectomy  79  (age 57)  . Cystoscopy with retrograde pyelogram, ureteroscopy and stent placement  11/03/2012    Procedure: CYSTOSCOPY WITH RETROGRADE PYELOGRAM, URETEROSCOPY AND STENT PLACEMENT;  Surgeon: Bernestine Amass, MD;  Location: [facility];  Service:  Urology;  Laterality: Left;  Cystoscopy/Left Retrograde/Ureteroscopy/Holmium Laser Litho/Double J Stent  . Holmium laser application  49/70/2637    Procedure: HOLMIUM LASER APPLICATION;  Surgeon: Bernestine Amass, MD;  Location: [facility];  Service: Urology;  Laterality: Left;  . Cardioversion N/A 04/02/2013    Procedure: CARDIOVERSION;  Surgeon: Jacolyn Reedy, MD;  Location: [place];  Service: Cardiovascular;  Laterality: N/A;  . Pilonidal cyst excision    . Robotic assited partial nephrectomy Left 06/18/2013    Procedure: ROBOTIC ASSISTED PARTIAL NEPHRECTOMY;  Surgeon: Dutch Gray, MD;  Location: [facility];  Service: Urology;  Laterality: Left;  . Nephrolithotomy Right 05/17/2014    Procedure: NEPHROLITHOTOMY PERCUTANEOUS;  Surgeon: Bernestine Amass, MD;  Location: [facility];  Service: Urology;  Laterality: Right;  . Melanoma excision Bilateral June 2016    20th-- left leg //  1st-- right leg w/ skin graft from right thigh (no lymph node bx)  . Cystoscopy with litholapaxy N/A 05/18/2015    Procedure: CYSTOSCOPY WITH LITHOLAPAXY;  Surgeon: Rana Snare, MD;  Location: [facility];  Service: Urology;  Laterality: N/A;  . Holmium laser application N/A 8/58/8502    Procedure: HOLMIUM LASER APPLICATION;  Surgeon: Rana Snare, MD;  Location: [place];  Service: Urology;  Laterality: N/A;    Social History   Social History  . Marital Status: Married    Spouse Name: N/A  . Number of Children: N/A  . Years of Education: 16   Occupational History  . Government social research officer, Psychologist, occupational    Social History Main Topics  . Smoking status: Former Smoker -- 1.00 packs/day for 6 years    Types: Cigarettes    Quit date: 10/31/1969  . Smokeless tobacco: Never Used  . Alcohol Use: 0.0 oz/week    0 Standard drinks or equivalent per week     Comment: OCCASIONAL  . Drug Use: No  . Sexual Activity: Not on file   Other Topics Concern  . Not on file   Social  History Narrative   Regular exercise-no    Current Outpatient Prescriptions on File Prior to Visit  Medication Sig Dispense Refill  . acetaminophen (TYLENOL) 500 MG tablet Take 500 mg by mouth every 6 (six) hours as needed for pain.    Marland Kitchen amLODipine (NORVASC) 5 MG tablet Take 5 mg by mouth every morning. Patient takes in am    . aspirin 81 MG tablet Take 81 mg by mouth daily.    Marland Kitchen doxazosin (CARDURA) 8 MG tablet Take 8 mg by mouth at bedtime.     . metoprolol tartrate (LOPRESSOR) 25 MG tablet Take 25 mg by mouth 2 (two) times daily.     . phenazopyridine (PYRIDIUM) 100 MG tablet Take 1 tablet (100 mg total) by mouth 3 (three) times daily as needed for pain. 20 tablet 0  . potassium citrate (UROCIT-K) 10 MEQ (1080 MG) SR tablet Take 2 tablets (20 mEq total) by mouth 2 (two) times daily. (Patient taking differently: Take 20 mEq by mouth daily. ) 120 tablet 60  . pravastatin (PRAVACHOL) 40 MG tablet Take 40 mg by mouth every evening.     . Tamsulosin HCl (FLOMAX) 0.4 MG CAPS Take 0.4 mg by mouth as directed. [place], Wednesday , and Friday  PRN    . valsartan-hydrochlorothiazide (DIOVAN-HCT) 320-25 MG per tablet Take 1 tablet by mouth every evening.      No current facility-administered medications on file prior to visit.    Allergies  Allergen Reactions  . Pioglitazone Swelling    edema    Family History  Problem Relation Age of Onset  . Arthritis Other     Parents  . Hypertension Other     Parents  . Diabetes Other     Parents    BP 112/76 mmHg  Pulse 65  Temp(Src) 98 F (36.7 C) (Oral)  Ht 5\' 11"  (1.803 m)  Wt 255 lb 8 oz (115.894 kg)  BMI 35.65 kg/m2  SpO2 97%  Review of Systems He denies hypoglycemia    Objective:   Physical Exam VITAL SIGNS:  See vs page GENERAL: no distress Pulses: dorsalis pedis intact bilat.  Feet: no deformity. feet are of normal color and temp. Trace bilat edema, healed ulcers, and severe hyperpigmentation of the ant tibial areas (he recently  had bilat melanomas removed). The left posterior tibial area has a healed ulcer.  Skin: no ulceron the feet.  Neuro: sensation is intact to touch on the feet.    (pt says a1c was recently 8.2%)    Assessment & Plan:  DM: he needs increased rx Noncompliance with cbg recording, persistent: I'll work around this as best I can, which is to titrate insulin based on a1c.  Patient is advised the following: Patient Instructions  check your blood sugar once a day.  vary the time of day when you check, between before the 3 meals, and at bedtime.  also check if you have symptoms of your blood sugar being too high or too low.  please keep a record of the readings and bring it to your next appointment here.  please call us sooner if you are having low blood sugar episodes, or if it is running above 150.   Please increase the insulin to 280 units each morning.  Please come back for a follow-up appointment in 3 months.

## 2015-09-09 NOTE — Patient Instructions (Addendum)
check your blood sugar once a day.  vary the time of day when you check, between before the 3 meals, and at bedtime.  also check if you have symptoms of your blood sugar being too high or too low.  please keep a record of the readings and bring it to your next appointment here.  please call us sooner if you are having low blood sugar episodes, or if it is running above 150.   Please increase the insulin to 280 units each morning.  Please come back for a follow-up appointment in 3 months.

## 2015-09-12 ENCOUNTER — Telehealth: Payer: Self-pay | Admitting: Endocrinology

## 2015-09-12 MED ORDER — INSULIN DETEMIR 100 UNIT/ML FLEXPEN: 280.0000 [IU] | PEN_INJECTOR | SUBCUTANEOUS | Status: DC

## 2015-09-12 NOTE — Telephone Encounter (Signed)
Patient stated pharmacy haven't received medication Insulin Detemir (LEVEMIR FLEXTOUCH) 100 UNIT/ML [address]. [place], [place] [phone] (Phone) [phone] (Fax)

## 2015-09-12 NOTE — Telephone Encounter (Signed)
Rx sent per pt's request.  

## 2015-09-20 ENCOUNTER — Other Ambulatory Visit: Payer: Self-pay

## 2015-12-08 ENCOUNTER — Ambulatory Visit: Payer: BC Managed Care – PPO | Admitting: Endocrinology

## 2015-12-20 ENCOUNTER — Ambulatory Visit (INDEPENDENT_AMBULATORY_CARE_PROVIDER_SITE_OTHER): Payer: BLUE CROSS/BLUE SHIELD | Admitting: Endocrinology

## 2015-12-20 ENCOUNTER — Encounter: Payer: Self-pay | Admitting: Endocrinology

## 2015-12-20 VITALS — BP 138/76 | HR 66 | Temp 98.0°F | Ht 71.0 in | Wt 261.0 lb

## 2015-12-20 DIAGNOSIS — E1021 Type 1 diabetes mellitus with diabetic nephropathy: Secondary | ICD-10-CM

## 2015-12-20 LAB — POCT GLYCOSYLATED HEMOGLOBIN (HGB A1C): Hemoglobin A1C: 9.3

## 2015-12-20 MED ORDER — INSULIN DETEMIR 100 UNIT/ML FLEXPEN: 340.0000 [IU] | PEN_INJECTOR | SUBCUTANEOUS | Status: DC

## 2015-12-20 NOTE — Patient Instructions (Addendum)
check your blood sugar once a day.  vary the time of day when you check, between before the 3 meals, and at bedtime.  also check if you have symptoms of your blood sugar being too high or too low.  please keep a record of the readings and bring it to your next appointment here.  please call us sooner if you are having low blood sugar episodes, or if it is running above 150.   Please increase the insulin to 340 units each morning.  On this type of insulin schedule, you should eat meals on a regular schedule.  If a meal is missed or significantly delayed, your blood sugar could go low.  Please come back for a follow-up appointment in 2 months.

## 2015-12-20 NOTE — Progress Notes (Signed)
Subjective:    Patient ID: Matthew Schmitt, male    DOB: Oct 25, 1951, 65 y.o.   MRN: [id number]  HPI Pt returns for f/u of diabetes mellitus: DM type: Insulin-requiring type 2. Dx'ed: 2002.   Complications: nephropathy, leg ulcers, and CAD.  Therapy: insulin since 2015.   DKA: never.   Severe hypoglycemia: never.  Pancreatitis: never.   Other: he has chosen a QD insulin regimen.     Interval history: He takes the levemir, 280 units QAM.  no cbg record, but states cbg's are in the 200's.  He continues to check in am only.  pt states he feels well in general, except for weight gain.   Past Medical History  Diagnosis Date  . Impotence of organic origin   . Mixed hyperlipidemia     slightly  . Essential hypertension, benign   . History of kidney stones   . RBBB   . Arthritis     everywhere  . Heart murmur   . Bladder calculus   . Type 2 diabetes mellitus ([place])   . PAF (paroxysmal atrial fibrillation) ([place])   . Coronary artery disease     cardiologist-  dr Wynonia Lawman  . First degree heart block   . History of melanoma excision     June 2016  --- s/p  excision bilateral leg   . Lower urinary tract symptoms (LUTS)   . Presence of surgical incision     x2  bilateral leg and right thigh  . Wears glasses   . Multiple renal cysts     bilateral  . History of renal cell carcinoma     s/p  left partial nephrectomy    Past Surgical History  Procedure Laterality Date  . Knee surgery Bilateral 1978  &  1974  . Cardiac catheterization  08-17-2011  DR SPENCER TILLEY    POSSIBLE MODERATE LAD STENOSIS JUST AFTER THE BIFURCATION OF LARGE DIAGONAL BRANCH/ LVEF  40-45%/ MILD GLOBAL HYPODINESIS (CATH DONE FOR ABNORMAL STRESS TEST OF FIXED LATERAL WALL DEFECT & BIFASCICULAR BLAOCK)  . Tonsillectomy  61  (age 61)  . Cystoscopy with retrograde pyelogram, ureteroscopy and stent placement  11/03/2012    Procedure: CYSTOSCOPY WITH RETROGRADE PYELOGRAM, URETEROSCOPY AND STENT PLACEMENT;  Surgeon:  Bernestine Amass, MD;  Location: [facility];  Service: Urology;  Laterality: Left;  Cystoscopy/Left Retrograde/Ureteroscopy/Holmium Laser Litho/Double J Stent  . Holmium laser application  [id number]    Procedure: HOLMIUM LASER APPLICATION;  Surgeon: Bernestine Amass, MD;  Location: [facility];  Service: Urology;  Laterality: Left;  . Cardioversion N/A 04/02/2013    Procedure: CARDIOVERSION;  Surgeon: Jacolyn Reedy, MD;  Location: [place];  Service: Cardiovascular;  Laterality: N/A;  . Pilonidal cyst excision    . Robotic assited partial nephrectomy Left 06/18/2013    Procedure: ROBOTIC ASSISTED PARTIAL NEPHRECTOMY;  Surgeon: Dutch Gray, MD;  [facility];  Service: Urology;  Laterality: Left;  . Nephrolithotomy Right 05/17/2014    Procedure: NEPHROLITHOTOMY PERCUTANEOUS;  Surgeon: Bernestine Amass, MD;  Location[facility];  Service: Urology;  Laterality: Right;  . Melanoma excision Bilateral June 2016    20th-- left leg //  1st-- right leg w/ skin graft from right thigh (no lymph node bx)  . Cystoscopy with litholapaxy N/A 05/18/2015    Procedure: CYSTOSCOPY WITH LITHOLAPAXY;  Surgeon: Rana Snare, MD;  Location: [facility];  Service: Urology;  Laterality: N/A;  . Holmium laser application N/A 0000000  Procedure: HOLMIUM LASER APPLICATION;  Surgeon: Rana Snare, MD;  Location: [facility];  Service: Urology;  Laterality: N/A;    Social History   Social History  . Marital Status: Married    Spouse Name: N/A  . Number of Children: N/A  . Years of Education: 16   Occupational History  . Government social research officer, Psychologist, occupational    Social History Main Topics  . Smoking status: Former Smoker -- 1.00 packs/day for 6 years    Types: Cigarettes    Quit date: 10/31/1969  . Smokeless tobacco: Never Used  . Alcohol Use: 0.0 oz/week    0 Standard drinks or equivalent per week     Comment: OCCASIONAL  . Drug Use: No  . Sexual Activity:  Not on file   Other Topics Concern  . Not on file   Social History Narrative   Regular exercise-no    Current Outpatient Prescriptions on File Prior to Visit  Medication Sig Dispense Refill  . acetaminophen (TYLENOL) 500 MG tablet Take 500 mg by mouth every 6 (six) hours as needed for pain.    Marland Kitchen amLODipine (NORVASC) 5 MG tablet Take 5 mg by mouth every morning. Patient takes in am    . aspirin 81 MG tablet Take 81 mg by mouth daily.    Marland Kitchen doxazosin (CARDURA) 8 MG tablet Take 8 mg by mouth at bedtime.     . metoprolol tartrate (LOPRESSOR) 25 MG tablet Take 25 mg by mouth 2 (two) times daily.     . phenazopyridine (PYRIDIUM) 100 MG tablet Take 1 tablet (100 mg total) by mouth 3 (three) times daily as needed for pain. 20 tablet 0  . potassium citrate (UROCIT-K) 10 MEQ (1080 MG) SR tablet Take 2 tablets (20 mEq total) by mouth 2 (two) times daily. (Patient taking differently: Take 20 mEq by mouth daily. ) 120 tablet 60  . pravastatin (PRAVACHOL) 40 MG tablet Take 40 mg by mouth every evening.     . Tamsulosin HCl (FLOMAX) 0.4 MG CAPS Take 0.4 mg by mouth as directed. [place], Wednesday , and Friday  PRN    . valsartan-hydrochlorothiazide (DIOVAN-HCT) 320-25 MG per tablet Take 1 tablet by mouth every evening.      No current facility-administered medications on file prior to visit.    Allergies  Allergen Reactions  . Pioglitazone Swelling    edema    Family History  Problem Relation Age of Onset  . Arthritis Other     Parents  . Hypertension Other     Parents  . Diabetes Other     Parents    BP 138/76 mmHg  Pulse 66  Temp(Src) 98 F (36.7 C) (Oral)  Ht 5\' 11"  (1.803 m)  Wt 261 lb (118.389 kg)  BMI 36.42 kg/m2  SpO2 97%    Review of Systems He denies hypoglycemia.      Objective:   Physical Exam VITAL SIGNS: See vs page GENERAL: no distress Pulses: dorsalis pedis intact bilat.  Feet: no deformity. feet are of normal color and temp. Trace bilat edema, healed ulcers,  and severe hyperpigmentation of the ant tibial areas. The left posterior tibial area has a healed ulcer.  There is bilateral onychomycosis of the toenails.   Skin: no ulceron the feet.  Neuro: sensation is intact to touch on the feet.    A1c=9.3%     Assessment & Plan:  DM: worse Noncompliance with cbg recording, persistent: I'll work around this as best I can,  which is to titrate insulin according to A1c.   Patient is advised the following: Patient Instructions  check your blood sugar once a day.  vary the time of day when you check, between before the 3 meals, and at bedtime.  also check if you have symptoms of your blood sugar being too high or too low.  please keep a record of the readings and bring it to your next appointment here.  please call us sooner if you are having low blood sugar episodes, or if it is running above 150.   Please increase the insulin to 340 units each morning.  On this type of insulin schedule, you should eat meals on a regular schedule.  If a meal is missed or significantly delayed, your blood sugar could go low.  Please come back for a follow-up appointment in 2 months.

## 2016-01-06 ENCOUNTER — Other Ambulatory Visit: Payer: Self-pay | Admitting: Cardiology

## 2016-02-03 ENCOUNTER — Other Ambulatory Visit: Payer: Self-pay | Admitting: Cardiology

## 2016-02-03 DIAGNOSIS — I712 Thoracic aortic aneurysm, without rupture, unspecified: Secondary | ICD-10-CM

## 2016-02-17 ENCOUNTER — Ambulatory Visit (INDEPENDENT_AMBULATORY_CARE_PROVIDER_SITE_OTHER): Payer: Managed Care, Other (non HMO) | Admitting: Endocrinology

## 2016-02-17 ENCOUNTER — Encounter: Payer: Self-pay | Admitting: Endocrinology

## 2016-02-17 ENCOUNTER — Ambulatory Visit
Admission: RE | Admit: 2016-02-17 | Discharge: 2016-02-17 | Disposition: A | Discharge status: Home / Self Care | Payer: Managed Care, Other (non HMO) | Source: Ambulatory Visit | Attending: Cardiology | Admitting: Cardiology

## 2016-02-17 VITALS — BP 138/78 | HR 74 | Temp 98.0°F | Ht 71.0 in | Wt 259.0 lb

## 2016-02-17 DIAGNOSIS — E1021 Type 1 diabetes mellitus with diabetic nephropathy: Secondary | ICD-10-CM | POA: Diagnosis not present

## 2016-02-17 DIAGNOSIS — I712 Thoracic aortic aneurysm, without rupture, unspecified: Secondary | ICD-10-CM

## 2016-02-17 LAB — POCT GLYCOSYLATED HEMOGLOBIN (HGB A1C): Hemoglobin A1C: 9.8

## 2016-02-17 MED ORDER — BASAGLAR KWIKPEN 100 UNIT/ML ~~LOC~~ SOPN: 300.0000 [IU] | PEN_INJECTOR | SUBCUTANEOUS | Status: DC

## 2016-02-17 NOTE — Patient Instructions (Addendum)
check your blood sugar once a day.  vary the time of day when you check, between before the 3 meals, and at bedtime.  also check if you have symptoms of your blood sugar being too high or too low.  please keep a record of the readings and bring it to your next appointment here.  please call us sooner if you are having low blood sugar episodes, or if it is running above 150.   Please change the levemir to basaglar, 300 units each morning.  On this type of insulin schedule, you should eat meals on a regular schedule.  If a meal is missed or significantly delayed, your blood sugar could go low.  It is important to never miss the insulin.  To help you remember, put the pens next to something else you take every day.  Please come back for a follow-up appointment in 2 months.

## 2016-02-17 NOTE — Progress Notes (Signed)
Subjective:    Patient ID: Matthew Schmitt, male    DOB: Sep 29, 1951, 65 y.o.   MRN: [id number]  HPI Pt returns for f/u of diabetes mellitus: DM type: Insulin-requiring type 2. Dx'ed: 2002.   Complications: nephropathy, leg ulcers, and CAD.  Therapy: insulin since 2015.   DKA: never.   Severe hypoglycemia: never.  Pancreatitis: never.   Other: he has chosen a QD insulin regimen.   Interval history: He takes the levemir, 340 units QAM.  no cbg record, but states cbg's vary from 170-240.  He continues to check in am only.  He says he misses the insulin approx once per week.  pt states he feels well in general, except for weight gain.  He wants to take just 1 pen per day.   Past Medical History  Diagnosis Date  . Impotence of organic origin   . Mixed hyperlipidemia     slightly  . Essential hypertension, benign   . History of kidney stones   . RBBB   . Arthritis     everywhere  . Heart murmur   . Bladder calculus   . Type 2 diabetes mellitus ([place])   . PAF (paroxysmal atrial fibrillation) ([place])   . Coronary artery disease     cardiologist-  dr Wynonia Lawman  . First degree heart block   . History of melanoma excision     June 2016  --- s/p  excision bilateral leg   . Lower urinary tract symptoms (LUTS)   . Presence of surgical incision     x2  bilateral leg and right thigh  . Wears glasses   . Multiple renal cysts     bilateral  . History of renal cell carcinoma     s/p  left partial nephrectomy    Past Surgical History  Procedure Laterality Date  . Knee surgery Bilateral 1978  &  1974  . Cardiac catheterization  08-17-2011  DR SPENCER TILLEY    POSSIBLE MODERATE LAD STENOSIS JUST AFTER THE BIFURCATION OF LARGE DIAGONAL BRANCH/ LVEF  40-45%/ MILD GLOBAL HYPODINESIS (CATH DONE FOR ABNORMAL STRESS TEST OF FIXED LATERAL WALL DEFECT & BIFASCICULAR BLAOCK)  . Tonsillectomy  51  (age 33)  . Cystoscopy with retrograde pyelogram, ureteroscopy and stent placement  11/03/2012   Procedure: CYSTOSCOPY WITH RETROGRADE PYELOGRAM, URETEROSCOPY AND STENT PLACEMENT;  Surgeon: Bernestine Amass, MD;  Location: [facility];  Service: Urology;  Laterality: Left;  Cystoscopy/Left Retrograde/Ureteroscopy/Holmium Laser Litho/Double J Stent  . Holmium laser application  123456    Procedure: HOLMIUM LASER APPLICATION;  Surgeon: Bernestine Amass, MD;  Location: [facility];  Service: Urology;  Laterality: Left;  . Cardioversion N/A 04/02/2013    Procedure: CARDIOVERSION;  Surgeon: Jacolyn Reedy, MD;  Location: [place];  Service: Cardiovascular;  Laterality: N/A;  . Pilonidal cyst excision    . Robotic assited partial nephrectomy Left 06/18/2013    Procedure: ROBOTIC ASSISTED PARTIAL NEPHRECTOMY;  Surgeon: Dutch Gray, MD;  Location: [facility];  Service: Urology;  Laterality: Left;  . Nephrolithotomy Right 05/17/2014    Procedure: NEPHROLITHOTOMY PERCUTANEOUS;  Surgeon: Bernestine Amass, MD;  Location: [facility];  Service: Urology;  Laterality: Right;  . Melanoma excision Bilateral June 2016    20th-- left leg //  1st-- right leg w/ skin graft from right thigh (no lymph node bx)  . Cystoscopy with litholapaxy N/A 05/18/2015    Procedure: CYSTOSCOPY WITH LITHOLAPAXY;  Surgeon: Rana Snare, MD;  Location: [place];  Service: Urology;  Laterality: N/A;  . Holmium laser application N/A 0000000    Procedure: HOLMIUM LASER APPLICATION;  Surgeon: Rana Snare, MD;  Location: [facility];  Service: Urology;  Laterality: N/A;    Social History   Social History  . Marital Status: Married    Spouse Name: N/A  . Number of Children: N/A  . Years of Education: 16   Occupational History  . Government social research officer, Psychologist, occupational    Social History Main Topics  . Smoking status: Former Smoker -- 1.00 packs/day for 6 years    Types: Cigarettes    Quit date: 10/31/1969  . Smokeless tobacco: Never Used  . Alcohol Use: 0.0 oz/week    0 Standard  drinks or equivalent per week     Comment: OCCASIONAL  . Drug Use: No  . Sexual Activity: Not on file   Other Topics Concern  . Not on file   Social History Narrative   Regular exercise-no    Current Outpatient Prescriptions on File Prior to Visit  Medication Sig Dispense Refill  . acetaminophen (TYLENOL) 500 MG tablet Take 500 mg by mouth every 6 (six) hours as needed for pain. Reported on 02/17/2016    . amLODipine (NORVASC) 5 MG tablet Take 5 mg by mouth every morning. Patient takes in am    . aspirin 81 MG tablet Take 81 mg by mouth daily.    Marland Kitchen doxazosin (CARDURA) 8 MG tablet Take 8 mg by mouth at bedtime.     . metoprolol tartrate (LOPRESSOR) 25 MG tablet Take 25 mg by mouth 2 (two) times daily.     . potassium citrate (UROCIT-K) 10 MEQ (1080 MG) SR tablet Take 2 tablets (20 mEq total) by mouth 2 (two) times daily. (Patient taking differently: Take 20 mEq by mouth daily. ) 120 tablet 60  . pravastatin (PRAVACHOL) 40 MG tablet Take 40 mg by mouth every evening.     . Tamsulosin HCl (FLOMAX) 0.4 MG CAPS Take 0.4 mg by mouth as directed. [place], Wednesday , and Friday  PRN    . Testosterone (ANDROGEL TD) Place 60.75 mg onto the skin.     . valsartan-hydrochlorothiazide (DIOVAN-HCT) 320-25 MG per tablet Take 1 tablet by mouth every evening.     . phenazopyridine (PYRIDIUM) 100 MG tablet Take 1 tablet (100 mg total) by mouth 3 (three) times daily as needed for pain. (Patient not taking: Reported on 02/17/2016) 20 tablet 0   No current facility-administered medications on file prior to visit.    Allergies  Allergen Reactions  . Pioglitazone Swelling    edema    Family History  Problem Relation Age of Onset  . Arthritis Other     Parents  . Hypertension Other     Parents  . Diabetes Other     Parents    BP 138/78 mmHg  Pulse 74  Temp(Src) 98 F (36.7 C) (Oral)  Ht 5\' 11"  (1.803 m)  Wt 259 lb (117.482 kg)  BMI 36.14 kg/m2  SpO2 96%  Review of Systems He reports  excessive appetite.    Objective:   Physical Exam VITAL SIGNS:  See vs page GENERAL: no distress Pulses: dorsalis pedis intact bilat.  Feet: no deformity. feet are of normal color and temp. Trace bilat edema, healed ulcers, and severe hyperpigmentation of the ant tibial areas. The left posterior tibial area has a healed ulcer. There is bilateral onychomycosis of the toenails.  Skin: no ulceron the  feet.  Neuro: sensation is intact to touch on the feet.    A1c=9.8%    Assessment & Plan:  DM: worse Obesity: worse.   Noncompliance with cbg recording and insulin: this compromises rx of DM.   Patient is advised the following: Patient Instructions  check your blood sugar once a day.  vary the time of day when you check, between before the 3 meals, and at bedtime.  also check if you have symptoms of your blood sugar being too high or too low.  please keep a record of the readings and bring it to your next appointment here.  please call us sooner if you are having low blood sugar episodes, or if it is running above 150.   Please change the levemir to basaglar, 300 units each morning.  On this type of insulin schedule, you should eat meals on a regular schedule.  If a meal is missed or significantly delayed, your blood sugar could go low.  It is important to never miss the insulin.  To help you remember, put the pens next to something else you take every day.  Please come back for a follow-up appointment in 2 months.

## 2016-02-21 ENCOUNTER — Encounter: Payer: Self-pay | Admitting: Gastroenterology

## 2016-02-22 ENCOUNTER — Other Ambulatory Visit: Payer: Self-pay

## 2016-02-22 MED ORDER — INSULIN GLARGINE 100 UNIT/ML SOLOSTAR PEN: 300.0000 [IU] | PEN_INJECTOR | Freq: Every day | SUBCUTANEOUS | Status: DC

## 2016-02-26 ENCOUNTER — Telehealth: Payer: Self-pay | Admitting: Cardiology

## 2016-02-26 NOTE — Telephone Encounter (Signed)
Pt called wit temp 101, HR 90 BP elevated.  He did have flu vaccine.  Instructed to go to ER or urgent care maybe flu and he could be given medications.  He will go today or AM.

## 2016-03-20 ENCOUNTER — Telehealth: Payer: Self-pay

## 2016-03-20 NOTE — Telephone Encounter (Signed)
Matthew Schmitt,This pt is scheduled for a colon on 04/03/16 with Dr Fuller Plan.Per Echo on 03/23/13 the EF was 25%.Please review and advise,Thanks

## 2016-03-21 NOTE — Telephone Encounter (Signed)
Matthew Schmitt,  This patient has severe cardiac disease and is NOT cleared for anesthetic care at [facility].  He procedure will need to be performed at the hospital.  Thanks,  Jenny Reichmann

## 2016-03-22 NOTE — Telephone Encounter (Signed)
Sending as high priority.  Pt scheduled for PV @ 8:30am this morning.  Please respond ASAP.  Thank you, Angela/PV

## 2016-03-22 NOTE — Telephone Encounter (Signed)
Dr Havery Moros,      Does this pt need OV or can he be a direct hospital pt?  Thank you, Angela/PV  Rollene Fare,      This pt is scheduled for PV this am.

## 2016-03-22 NOTE — Telephone Encounter (Signed)
He will need an OV  

## 2016-03-25 NOTE — Telephone Encounter (Signed)
Agreed this patient needs an office visit first prior to scheduling any procedure. Thanks

## 2016-03-26 ENCOUNTER — Encounter: Payer: Self-pay | Admitting: Gastroenterology

## 2016-03-27 NOTE — Telephone Encounter (Signed)
Pt scheduled for office visit with Dr. Havery Moros on 05/29/16 at 3:00pm.

## 2016-04-02 ENCOUNTER — Encounter: Payer: Self-pay | Admitting: Endocrinology

## 2016-04-02 ENCOUNTER — Ambulatory Visit (INDEPENDENT_AMBULATORY_CARE_PROVIDER_SITE_OTHER): Payer: Managed Care, Other (non HMO) | Admitting: Endocrinology

## 2016-04-02 VITALS — BP 126/70 | HR 74 | Wt 261.0 lb

## 2016-04-02 DIAGNOSIS — E1021 Type 1 diabetes mellitus with diabetic nephropathy: Secondary | ICD-10-CM

## 2016-04-02 NOTE — Patient Instructions (Addendum)
check your blood sugar once a day.  vary the time of day when you check, between before the 3 meals, and at bedtime.  also check if you have symptoms of your blood sugar being too high or too low.  please keep a record of the readings and bring it to your next appointment here.  please call us sooner if you are having low blood sugar episodes, or if it is running above 150.   A diabetes blood test is requested for you today.  We'll let you know about the result.  On this type of insulin schedule, you should eat meals on a regular schedule.  If a meal is missed or significantly delayed, your blood sugar could go low.  Please come back for a follow-up appointment in 2 months.

## 2016-04-02 NOTE — Progress Notes (Signed)
Subjective:    Patient ID: Matthew Schmitt, male    DOB: [birth year], 65 y.o.   MRN: [id number]  HPI Pt returns for f/u of diabetes mellitus: DM type: Insulin-requiring type 2. Dx'ed: 2002.   Complications: nephropathy, leg ulcers, and CAD.  Therapy: insulin since 2015.   DKA: never.   Severe hypoglycemia: never.  Pancreatitis: never.   Other: he declines multiple daily injections; he has severe insulin resistance.  In early 2017, he requested to change levemir to lantus, due to high dosage requirement.  Interval history: Early this am, pt had an episode of severe hypoglycemia.  911 was called, and he was rx'ed at the scene.  cbg was 22.  He declines transport to ER.  Until this am, he has not had any hypoglycemia, even mild.  he brings a record of his cbg's which i have reviewed today.  He checks in am only.  It varies from 79-239.  He is uncertain why the severe hypoglycemia happened today, as he says last night was no different from any other evening.  Wife says pt often skips meals.   Past Medical History  Diagnosis Date  . Impotence of organic origin   . Mixed hyperlipidemia     slightly  . Essential hypertension, benign   . History of kidney stones   . RBBB   . Arthritis     everywhere  . Heart murmur   . Bladder calculus   . Type 2 diabetes mellitus ([place])   . PAF (paroxysmal atrial fibrillation) ([place])   . Coronary artery disease     cardiologist-  dr Wynonia Lawman  . First degree heart block   . History of melanoma excision     June 2016  --- s/p  excision bilateral leg   . Lower urinary tract symptoms (LUTS)   . Presence of surgical incision     x2  bilateral leg and right thigh  . Wears glasses   . Multiple renal cysts     bilateral  . History of renal cell carcinoma     s/p  left partial nephrectomy    Past Surgical History  Procedure Laterality Date  . Knee surgery Bilateral 1978  &  1974  . Cardiac catheterization  08-17-2011  DR SPENCER TILLEY    POSSIBLE  MODERATE LAD STENOSIS JUST AFTER THE BIFURCATION OF LARGE DIAGONAL BRANCH/ LVEF  40-45%/ MILD GLOBAL HYPODINESIS (CATH DONE FOR ABNORMAL STRESS TEST OF FIXED LATERAL WALL DEFECT & BIFASCICULAR BLAOCK)  . Tonsillectomy  34  (age 74)  . Cystoscopy with retrograde pyelogram, ureteroscopy and stent placement  11/03/2012    Procedure: CYSTOSCOPY WITH RETROGRADE PYELOGRAM, URETEROSCOPY AND STENT PLACEMENT;  Surgeon: Bernestine Amass, MD;  Location: [facility];  Service: Urology;  Laterality: Left;  Cystoscopy/Left Retrograde/Ureteroscopy/Holmium Laser Litho/Double J Stent  . Holmium laser application  123456    Procedure: HOLMIUM LASER APPLICATION;  Surgeon: Bernestine Amass, MD;  Location: [facility];  Service: Urology;  Laterality: Left;  . Cardioversion N/A 04/02/2013    Procedure: CARDIOVERSION;  Surgeon: Jacolyn Reedy, MD;  Location: [place];  Service: Cardiovascular;  Laterality: N/A;  . Pilonidal cyst excision    . Robotic assited partial nephrectomy Left 06/18/2013    Procedure: ROBOTIC ASSISTED PARTIAL NEPHRECTOMY;  Surgeon: Dutch Gray, MD;  Location: [facility];  Service: Urology;  Laterality: Left;  . Nephrolithotomy Right 05/17/2014    Procedure: NEPHROLITHOTOMY PERCUTANEOUS;  Surgeon: Bernestine Amass, MD;  Location:  [facility];  Service: Urology;  Laterality: Right;  . Melanoma excision Bilateral June 2016    20th-- left leg //  1st-- right leg w/ skin graft from right thigh (no lymph node bx)  . Cystoscopy with litholapaxy N/A 05/18/2015    Procedure: CYSTOSCOPY WITH LITHOLAPAXY;  Surgeon: Rana Snare, MD;  Location: [facility];  Service: Urology;  Laterality: N/A;  . Holmium laser application N/A 0000000    Procedure: HOLMIUM LASER APPLICATION;  Surgeon: Rana Snare, MD;  Location: [facility];  Service: Urology;  Laterality: N/A;    Social History   Social History  . Marital Status: Married    Spouse Name: N/A  . Number of  Children: N/A  . Years of Education: 16   Occupational History  . Government social research officer, Psychologist, occupational    Social History Main Topics  . Smoking status: Former Smoker -- 1.00 packs/day for 6 years    Types: Cigarettes    Quit date: 10/31/1969  . Smokeless tobacco: Never Used  . Alcohol Use: 0.0 oz/week    0 Standard drinks or equivalent per week     Comment: OCCASIONAL  . Drug Use: No  . Sexual Activity: Not on file   Other Topics Concern  . Not on file   Social History Narrative   Regular exercise-no    Current Outpatient Prescriptions on File Prior to Visit  Medication Sig Dispense Refill  . acetaminophen (TYLENOL) 500 MG tablet Take 500 mg by mouth every 6 (six) hours as needed for pain. Reported on 02/17/2016    . amLODipine (NORVASC) 5 MG tablet Take 5 mg by mouth every morning. Patient takes in am    . aspirin 81 MG tablet Take 81 mg by mouth daily.    Marland Kitchen doxazosin (CARDURA) 8 MG tablet Take 8 mg by mouth at bedtime.     . Insulin Glargine (LANTUS SOLOSTAR) 100 UNIT/ML Solostar Pen Inject 300 Units into the skin daily at 10 pm. (Patient taking differently: Inject 250 Units into the skin daily at 10 pm. ) 105 pen 2  . metoprolol tartrate (LOPRESSOR) 25 MG tablet Take 25 mg by mouth 2 (two) times daily.     . phenazopyridine (PYRIDIUM) 100 MG tablet Take 1 tablet (100 mg total) by mouth 3 (three) times daily as needed for pain. 20 tablet 0  . potassium citrate (UROCIT-K) 10 MEQ (1080 MG) SR tablet Take 2 tablets (20 mEq total) by mouth 2 (two) times daily. (Patient taking differently: Take 20 mEq by mouth daily. ) 120 tablet 60  . pravastatin (PRAVACHOL) 40 MG tablet Take 40 mg by mouth every evening.     . Tamsulosin HCl (FLOMAX) 0.4 MG CAPS Take 0.4 mg by mouth as directed. [place], Wednesday , and Friday  PRN    . Testosterone (ANDROGEL TD) Place 60.75 mg onto the skin.     . valsartan-hydrochlorothiazide (DIOVAN-HCT) 320-25 MG per tablet Take 1 tablet by mouth every evening.        No current facility-administered medications on file prior to visit.    Allergies  Allergen Reactions  . Pioglitazone Swelling    edema    Family History  Problem Relation Age of Onset  . Arthritis Other     Parents  . Hypertension Other     Parents  . Diabetes Other     Parents    BP 126/70 mmHg  Pulse 74  Wt 261 lb (118.389 kg)  SpO2 98%  Review  of Systems No weight change    Objective:   Physical Exam VITAL SIGNS: See vs page GENERAL: no distress Pulses: dorsalis pedis intact bilat.  Feet: no deformity. feet are of normal color and temp. Trace bilat edema, healed ulcers, and severe hyperpigmentation of the ant tibial areas. The left posterior tibial area has a healed ulcer. There is bilateral onychomycosis of the toenails.  Skin: no ulceron the feet.  Neuro: sensation is intact to touch on the feet.    Fructosamine=237     Assessment & Plan:  DM: overcontrolled. Severe hypoglycemia, new: i advised him to change to multiple daily injections, or at least levemir qam.  He declines both.  Patient is advised the following: Patient Instructions  check your blood sugar once a day.  vary the time of day when you check, between before the 3 meals, and at bedtime.  also check if you have symptoms of your blood sugar being too high or too low.  please keep a record of the readings and bring it to your next appointment here.  please call us sooner if you are having low blood sugar episodes, or if it is running above 150.   A diabetes blood test is requested for you today.  We'll let you know about the result.  On this type of insulin schedule, you should eat meals on a regular schedule.  If a meal is missed or significantly delayed, your blood sugar could go low.  Please come back for a follow-up appointment in 2 months.    addendum: reduce lantus to 300 units qd.

## 2016-04-03 ENCOUNTER — Encounter: Payer: Managed Care, Other (non HMO) | Admitting: Gastroenterology

## 2016-04-03 LAB — FRUCTOSAMINE: Fructosamine: 237 umol/L (ref 0–285)

## 2016-04-17 ENCOUNTER — Ambulatory Visit (INDEPENDENT_AMBULATORY_CARE_PROVIDER_SITE_OTHER): Payer: Managed Care, Other (non HMO) | Admitting: Family Medicine

## 2016-04-17 ENCOUNTER — Ambulatory Visit (INDEPENDENT_AMBULATORY_CARE_PROVIDER_SITE_OTHER): Payer: Managed Care, Other (non HMO)

## 2016-04-17 VITALS — BP 146/88 | HR 86 | Temp 98.8°F | Resp 17 | Ht 71.0 in | Wt 259.0 lb

## 2016-04-17 DIAGNOSIS — M75102 Unspecified rotator cuff tear or rupture of left shoulder, not specified as traumatic: Secondary | ICD-10-CM | POA: Diagnosis not present

## 2016-04-17 HISTORY — DX: Unspecified rotator cuff tear or rupture of left shoulder, not specified as traumatic: M75.102

## 2016-04-17 NOTE — Assessment & Plan Note (Signed)
Most likely high grade thickness tear of the supraspinatus as severe pain and weakness with empty can and drop arm.  Will obtain 3 view shoulder to r/o underlying fx but doubt this is issue.  MRI to evaluate underlying tissue and referral to ortho for further tx including conservative vs operative management dependent upon scan.

## 2016-04-17 NOTE — Progress Notes (Signed)
  Matthew Schmitt - 65 y.o. male MRN [id number]  Date of birth: 1951-04-05  SUBJECTIVE:  Including CC & ROS.  Matthew Schmitt is a 65 y.o. male who presents today for L shoulder pain.    Shoulder Pain L, initial visit - Pt with two weeks of shoulder pain on L side as he had episode of hypoglycemia and fell onto L side.  Denies pain of area but has pain with movement or sometimes night time.  Pain with lifting and denies previous injury.  Rest has not improved this and he denies paresthesias.  R hand dominant.  Is having TKA of R knee performed in July of 2017.    PMHx - Updated and reviewed.  Contributory factors include: OA PSHx - Updated and reviewed.  Contributory factors include:  Knee arthroscopy B/L  FHx - Updated and reviewed.  Contributory factors include:  HTN Parents  Social Hx - Updated and reviewed. Contributory factors include: Previous smoker  Medications - Reviewed    ROS Per HPI   PE: Filed Vitals:   04/17/16 1523  BP: 146/88  Pulse: 86  Temp: 98.8 F (37.1 C)  Resp: 17   Gen: NAD Cardiorespiratory - Normal respiratory effort/rate.  RRR  MSK Shoulder:  L Shoulder: Inspection reveals no abnormalities, atrophy or asymmetry. Palpation is normal with no tenderness over AC joint or bicipital groove. ROM - 180 degrees flexion, 60 degrees extension, 150 abduction, 90 ER/IR  4/5 Empty can L>R.  IR/ER 5/5 B/L. + signs of impingement with + Neer and Hawkin's tests, empty can. Speeds and Yergason's tests normal. Normal scapular function observed. + painful arc and + drop arm sign. Negative Cross arm maneuver against resistance   Neurovascular status - Intact B/L UE

## 2016-04-17 NOTE — Patient Instructions (Signed)
     IF you received an x-ray today, you will receive an invoice from [facility]. Please contact [facility] at [phone] with questions or concerns regarding your invoice.   IF you received labwork today, you will receive an invoice from [facility]. Please contact [facility] at [phone] with questions or concerns regarding your invoice.   Our billing staff will not be able to assist you with questions regarding bills from these companies.  You will be contacted with the lab results as soon as they are available. The fastest way to get your results is to activate your My Chart account. Instructions are located on the last page of this paperwork. If you have not heard from us regarding the results in 2 weeks, please contact this office.      

## 2016-04-26 ENCOUNTER — Encounter: Payer: Self-pay | Admitting: Endocrinology

## 2016-04-26 ENCOUNTER — Ambulatory Visit (INDEPENDENT_AMBULATORY_CARE_PROVIDER_SITE_OTHER): Payer: Managed Care, Other (non HMO) | Admitting: Endocrinology

## 2016-04-26 VITALS — BP 112/60 | HR 74 | Temp 97.9°F | Ht 71.0 in | Wt 256.0 lb

## 2016-04-26 DIAGNOSIS — E1021 Type 1 diabetes mellitus with diabetic nephropathy: Secondary | ICD-10-CM | POA: Diagnosis not present

## 2016-04-26 LAB — POCT GLYCOSYLATED HEMOGLOBIN (HGB A1C): Hemoglobin A1C: 7.6

## 2016-04-26 NOTE — Progress Notes (Signed)
Subjective:    Patient ID: Matthew Schmitt, male    DOB: [birth year], 65 y.o.   MRN: [id number]  HPI Pt returns for f/u of diabetes mellitus: DM type: Insulin-requiring type 2. Dx'ed: 2002.   Complications: nephropathy, leg ulcers, and CAD.  Therapy: insulin since 2015.   DKA: never.   Severe hypoglycemia: once, in 2017 Pancreatitis: never.   Other: he declines multiple daily injections; he has severe insulin resistance.  In early 2017, he requested to change levemir to lantus, due to high dosage requirement.  Interval history: no cbg record, but states cbg's vary from 60-160.  It is in general higher as the day goes on.  He says his biggest meal is lunch.  He says he does not check cbg enough to say if afternoon or hs cbg is higher.  He says cbg's are slightly higher on workdays than weekends.   Past Medical History  Diagnosis Date  . Impotence of organic origin   . Mixed hyperlipidemia     slightly  . Essential hypertension, benign   . History of kidney stones   . RBBB   . Arthritis     everywhere  . Heart murmur   . Bladder calculus   . Type 2 diabetes mellitus ([place])   . PAF (paroxysmal atrial fibrillation) ([place])   . Coronary artery disease     cardiologist-  dr Wynonia Lawman  . First degree heart block   . History of melanoma excision     June 2016  --- s/p  excision bilateral leg   . Lower urinary tract symptoms (LUTS)   . Presence of surgical incision     x2  bilateral leg and right thigh  . Wears glasses   . Multiple renal cysts     bilateral  . History of renal cell carcinoma     s/p  left partial nephrectomy    Past Surgical History  Procedure Laterality Date  . Knee surgery Bilateral 1978  &  1974  . Cardiac catheterization  08-17-2011  DR SPENCER TILLEY    POSSIBLE MODERATE LAD STENOSIS JUST AFTER THE BIFURCATION OF LARGE DIAGONAL BRANCH/ LVEF  40-45%/ MILD GLOBAL HYPODINESIS (CATH DONE FOR ABNORMAL STRESS TEST OF FIXED LATERAL WALL DEFECT & BIFASCICULAR BLAOCK)    . Tonsillectomy  43  (age 62)  . Cystoscopy with retrograde pyelogram, ureteroscopy and stent placement  11/03/2012    Procedure: CYSTOSCOPY WITH RETROGRADE PYELOGRAM, URETEROSCOPY AND STENT PLACEMENT;  Surgeon: Bernestine Amass, MD;  Location: [facility];  Service: Urology;  Laterality: Left;  Cystoscopy/Left Retrograde/Ureteroscopy/Holmium Laser Litho/Double J Stent  . Holmium laser application  123456    Procedure: HOLMIUM LASER APPLICATION;  Surgeon: Bernestine Amass, MD;  Location: [facility];  Service: Urology;  Laterality: Left;  . Cardioversion N/A 04/02/2013    Procedure: CARDIOVERSION;  Surgeon: Jacolyn Reedy, MD;  Location: [place];  Service: Cardiovascular;  Laterality: N/A;  . Pilonidal cyst excision    . Robotic assited partial nephrectomy Left 06/18/2013    Procedure: ROBOTIC ASSISTED PARTIAL NEPHRECTOMY;  Surgeon: Dutch Gray, MD;  Location: [facility];  Service: Urology;  Laterality: Left;  . Nephrolithotomy Right 05/17/2014    Procedure: NEPHROLITHOTOMY PERCUTANEOUS;  Surgeon: Bernestine Amass, MD;  Location: [facility];  Service: Urology;  Laterality: Right;  . Melanoma excision Bilateral June 2016    20th-- left leg //  1st-- right leg w/ skin graft from right thigh (no lymph node bx)  .  Cystoscopy with litholapaxy N/A 05/18/2015    Procedure: CYSTOSCOPY WITH LITHOLAPAXY;  Surgeon: Rana Snare, MD;  Location: [facility];  Service: Urology;  Laterality: N/A;  . Holmium laser application N/A 0000000    Procedure: HOLMIUM LASER APPLICATION;  Surgeon: Rana Snare, MD;  Location: [facility];  Service: Urology;  Laterality: N/A;    Social History   Social History  . Marital Status: Married    Spouse Name: N/A  . Number of Children: N/A  . Years of Education: 16   Occupational History  . Government social research officer, Psychologist, occupational    Social History Main Topics  . Smoking status: Former Smoker -- 1.00 packs/day for 6 years     Types: Cigarettes    Quit date: 10/31/1969  . Smokeless tobacco: Never Used  . Alcohol Use: 0.0 oz/week    0 Standard drinks or equivalent per week     Comment: OCCASIONAL  . Drug Use: No  . Sexual Activity: No   Other Topics Concern  . Not on file   Social History Narrative   Regular exercise-no    Current Outpatient Prescriptions on File Prior to Visit  Medication Sig Dispense Refill  . acetaminophen (TYLENOL) 500 MG tablet Take 500 mg by mouth every 6 (six) hours as needed for pain. Reported on 02/17/2016    . amLODipine (NORVASC) 5 MG tablet Take 5 mg by mouth every morning. Patient takes in am    . aspirin 81 MG tablet Take 81 mg by mouth daily.    Marland Kitchen doxazosin (CARDURA) 8 MG tablet Take 8 mg by mouth at bedtime.     . Insulin Glargine (LANTUS SOLOSTAR) 100 UNIT/ML Solostar Pen Inject 300 Units into the skin daily at 10 pm. (Patient taking differently: Inject 200 Units into the skin daily. ) 105 pen 2  . metoprolol tartrate (LOPRESSOR) 25 MG tablet Take 25 mg by mouth 2 (two) times daily.     . phenazopyridine (PYRIDIUM) 100 MG tablet Take 1 tablet (100 mg total) by mouth 3 (three) times daily as needed for pain. 20 tablet 0  . potassium citrate (UROCIT-K) 10 MEQ (1080 MG) SR tablet Take 2 tablets (20 mEq total) by mouth 2 (two) times daily. (Patient taking differently: Take 20 mEq by mouth daily. ) 120 tablet 60  . pravastatin (PRAVACHOL) 40 MG tablet Take 40 mg by mouth every evening.     . Tamsulosin HCl (FLOMAX) 0.4 MG CAPS Take 0.4 mg by mouth as directed. [place], Wednesday , and Friday  PRN    . Testosterone (ANDROGEL TD) Place 60.75 mg onto the skin.     . valsartan-hydrochlorothiazide (DIOVAN-HCT) 320-25 MG per tablet Take 1 tablet by mouth every evening.      No current facility-administered medications on file prior to visit.    Allergies  Allergen Reactions  . Pioglitazone Swelling    edema    Family History  Problem Relation Age of Onset  . Arthritis Other      Parents  . Hypertension Other     Parents  . Diabetes Other     Parents    BP 112/60 mmHg  Pulse 74  Temp(Src) 97.9 F (36.6 C) (Oral)  Ht 5\' 11"  (1.803 m)  Wt 256 lb (116.121 kg)  BMI 35.72 kg/m2  SpO2 97%  Review of Systems Denies any further severe hypoglycemia.     Objective:   Physical Exam VITAL SIGNS:  See vs page GENERAL: no distress Pulses: dorsalis  pedis intact bilat.  Feet: no deformity. feet are of normal color and temp. Trace bilat edema, healed ulcers, and severe hyperpigmentation of the ant tibial areas. The left posterior tibial area has a healed ulcer. There is bilateral onychomycosis of the toenails.  Skin: no ulceron the feet.  Neuro: sensation is intact to touch on the feet.    A1c=7.6% Fructosamine=263    Assessment & Plan:  Insulin-requiring type 2 DM: still overcontrolled.  Reduce lantus to 200/d  Patient is advised the following: Patient Instructions  check your blood sugar once a day.  vary the time of day when you check, between before the 3 meals, and at bedtime.  also check if you have symptoms of your blood sugar being too high or too low.  please keep a record of the readings and bring it to your next appointment here.  please call us sooner if you are having low blood sugar episodes, or if it is running above 150.  For now, what we need to know most is which is higher: afternoon or bedtime. On this type of insulin schedule, you should eat meals on a regular schedule.  If a meal is missed or significantly delayed, your blood sugar could go low.  blood tests are requested for you today.  We'll let you know about the results.  This (and your blood sugar pattern) will tell us if we need to decrease the lantus, or add another shot of fast-acting insulin with a meal.   Please come back for a follow-up appointment in 3 months.     Renato Shin, MD

## 2016-04-26 NOTE — Patient Instructions (Addendum)
check your blood sugar once a day.  vary the time of day when you check, between before the 3 meals, and at bedtime.  also check if you have symptoms of your blood sugar being too high or too low.  please keep a record of the readings and bring it to your next appointment here.  please call us sooner if you are having low blood sugar episodes, or if it is running above 150.  For now, what we need to know most is which is higher: afternoon or bedtime. On this type of insulin schedule, you should eat meals on a regular schedule.  If a meal is missed or significantly delayed, your blood sugar could go low.  blood tests are requested for you today.  We'll let you know about the results.  This (and your blood sugar pattern) will tell us if we need to decrease the lantus, or add another shot of fast-acting insulin with a meal.   Please come back for a follow-up appointment in 3 months.

## 2016-04-27 LAB — FRUCTOSAMINE: Fructosamine: 263 umol/L (ref 0–285)

## 2016-05-29 ENCOUNTER — Ambulatory Visit (INDEPENDENT_AMBULATORY_CARE_PROVIDER_SITE_OTHER): Payer: Managed Care, Other (non HMO) | Admitting: Gastroenterology

## 2016-05-29 ENCOUNTER — Encounter: Payer: Self-pay | Admitting: Gastroenterology

## 2016-05-29 VITALS — BP 128/60 | HR 80 | Ht 71.0 in | Wt 258.5 lb

## 2016-05-29 DIAGNOSIS — Z1211 Encounter for screening for malignant neoplasm of colon: Secondary | ICD-10-CM

## 2016-05-29 MED ORDER — NA SULFATE-K SULFATE-MG SULF 17.5-3.13-1.6 GM/177ML PO SOLN: 1.0000 | Freq: Once | ORAL | Status: DC

## 2016-05-29 NOTE — Progress Notes (Signed)
HPI :  65 y/o male here to discuss colon cancer screening. He is a new patient to our office. He has a cardiac history of AFibb / A flutter. Remote Echo in 2014 showed EF 25-30%, however recent myoview EST showed no ischemic changes and an EF of 50% doe this year (paper chart provided and reviewed). He does not take any blood thinners at present time.  No chest pains or shortness of breath that limits him.  He denies any blood in the stools. HE denies any chronic diarrhea or constipations. No weight loss. NO FH of CRC known. Father may have had some polyps removed. No complaints of the digestive. No anemia on labs, Hgb normal (paper records from primary care reviewed)      Past Medical History  Diagnosis Date  . Impotence of organic origin   . Mixed hyperlipidemia     slightly  . Essential hypertension, benign   . History of kidney stones   . RBBB   . Arthritis     everywhere  . Heart murmur   . Bladder calculus   . Type 2 diabetes mellitus ([place])   . PAF (paroxysmal atrial fibrillation) ([place])   . Coronary artery disease     cardiologist-  dr Wynonia Lawman  . First degree heart block   . History of melanoma excision     June 2016  --- s/p  excision bilateral leg   . Lower urinary tract symptoms (LUTS)   . Presence of surgical incision     x2  bilateral leg and right thigh  . Wears glasses   . Multiple renal cysts     bilateral  . History of renal cell carcinoma     s/p  left partial nephrectomy     Past Surgical History  Procedure Laterality Date  . Knee surgery Bilateral 1978  &  1974  . Cardiac catheterization  08-17-2011  DR SPENCER TILLEY    POSSIBLE MODERATE LAD STENOSIS JUST AFTER THE BIFURCATION OF LARGE DIAGONAL BRANCH/ LVEF  40-45%/ MILD GLOBAL HYPODINESIS (CATH DONE FOR ABNORMAL STRESS TEST OF FIXED LATERAL WALL DEFECT & BIFASCICULAR BLAOCK)  . Tonsillectomy  31  (age 47)  . Cystoscopy with retrograde pyelogram, ureteroscopy and stent placement  11/03/2012   Procedure: CYSTOSCOPY WITH RETROGRADE PYELOGRAM, URETEROSCOPY AND STENT PLACEMENT;  Surgeon: Bernestine Amass, MD;  Location: [facility];  Service: Urology;  Laterality: Left;  Cystoscopy/Left Retrograde/Ureteroscopy/Holmium Laser Litho/Double J Stent  . Holmium laser application  123456    Procedure: HOLMIUM LASER APPLICATION;  Surgeon: Bernestine Amass, MD;  Location: [facility];  Service: Urology;  Laterality: Left;  . Cardioversion N/A 04/02/2013    Procedure: CARDIOVERSION;  Surgeon: Jacolyn Reedy, MD;  Location: [place];  Service: Cardiovascular;  Laterality: N/A;  . Pilonidal cyst excision    . Robotic assited partial nephrectomy Left 06/18/2013    Procedure: ROBOTIC ASSISTED PARTIAL NEPHRECTOMY;  Surgeon: Dutch Gray, MD;  Location: [facility];  Service: Urology;  Laterality: Left;  . Nephrolithotomy Right 05/17/2014    Procedure: NEPHROLITHOTOMY PERCUTANEOUS;  Surgeon: Bernestine Amass, MD;  Location: [facility];  Service: Urology;  Laterality: Right;  . Melanoma excision Bilateral June 2016    20th-- left leg //  1st-- right leg w/ skin graft from right thigh (no lymph node bx)  . Cystoscopy with litholapaxy N/A 05/18/2015    Procedure: CYSTOSCOPY WITH LITHOLAPAXY;  Surgeon: Rana Snare, MD;  Location: [facility];  Service: Urology;  Laterality: N/A;  . Holmium laser application N/A 0000000    Procedure: HOLMIUM LASER APPLICATION;  Surgeon: Rana Snare, MD;  Location: [facility];  Service: Urology;  Laterality: N/A;   Family History  Problem Relation Age of Onset  . Arthritis Father   . Hypertension Father   . Diabetes Father   . Arthritis Mother   . Hypertension Mother   . Heart attack Brother 68  . Diabetes Brother   . Lymphoma Paternal Grandfather   . Cancer Maternal Uncle     x 4  . Cancer Paternal Uncle     type unknown   Social History  Substance Use Topics  . Smoking status: Former Smoker -- 1.00 packs/day for 6  years    Types: Cigarettes    Quit date: 10/31/1969  . Smokeless tobacco: Never Used  . Alcohol Use: 0.0 oz/week    0 Standard drinks or equivalent per week     Comment: OCCASIONAL   Current Outpatient Prescriptions  Medication Sig Dispense Refill  . acetaminophen (TYLENOL) 500 MG tablet Take 500 mg by mouth every 6 (six) hours as needed for pain. Reported on 02/17/2016    . amLODipine (NORVASC) 5 MG tablet Take 5 mg by mouth every morning. Patient takes in am    . aspirin 81 MG tablet Take 81 mg by mouth daily.    Marland Kitchen doxazosin (CARDURA) 8 MG tablet Take 8 mg by mouth at bedtime.     . Insulin Glargine (LANTUS SOLOSTAR) 100 UNIT/ML Solostar Pen Inject 300 Units into the skin daily at 10 pm. (Patient taking differently: Inject 200 Units into the skin daily. ) 105 pen 2  . metoprolol tartrate (LOPRESSOR) 25 MG tablet Take 25 mg by mouth 2 (two) times daily.     . phenazopyridine (PYRIDIUM) 100 MG tablet Take 1 tablet (100 mg total) by mouth 3 (three) times daily as needed for pain. 20 tablet 0  . potassium citrate (UROCIT-K) 10 MEQ (1080 MG) SR tablet Take 2 tablets (20 mEq total) by mouth 2 (two) times daily. (Patient taking differently: Take 80 mEq by mouth 2 (two) times daily. ) 120 tablet 60  . pravastatin (PRAVACHOL) 40 MG tablet Take 40 mg by mouth every evening.     . sulfamethoxazole-trimethoprim (BACTRIM DS,SEPTRA DS) 800-160 MG tablet Take 1 tablet by mouth 2 (two) times daily.  0  . Tamsulosin HCl (FLOMAX) 0.4 MG CAPS Take 0.4 mg by mouth as directed. [place], Wednesday , and Friday  PRN    . Testosterone (ANDROGEL TD) Place 60.75 mg onto the skin.     . valsartan-hydrochlorothiazide (DIOVAN-HCT) 320-25 MG per tablet Take 1 tablet by mouth every evening.      No current facility-administered medications for this visit.   Allergies  Allergen Reactions  . Actos [Pioglitazone] Swelling    edema     Review of Systems: All systems reviewed and negative except where noted in HPI.       Physical Exam: BP 128/60 mmHg  Pulse 80  Ht 5\' 11"  (1.803 m)  Wt 258 lb 8 oz (117.255 kg)  BMI 36.07 kg/m2 Constitutional: Pleasant,well-developed, male in no acute distress. HEENT: Normocephalic and atraumatic. Conjunctivae are normal. No scleral icterus. Neck supple.  Cardiovascular: Normal rate, regular rhythm.  Pulmonary/chest: Effort normal and breath sounds normal. No wheezing, rales or rhonchi. Abdominal: Soft, nondistended, nontender, protuberant. Bowel sounds active throughout. There are no masses palpable. No hepatomegaly. Extremities: no edema Lymphadenopathy: No  cervical adenopathy noted. Neurological: Alert and oriented to person place and time. Skin: Skin is warm and dry. No rashes noted. Psychiatric: Normal mood and affect. Behavior is normal.   ASSESSMENT AND PLAN: 65 y/o male who is average risk for CRC, but overdue for CRC screening (no prior screening). I discussed options with him to include stool based testing and colonoscopy. He has a history of AF/flutter but well controlled at this time, recent EST myoview showed an EF of 50% earlier this year, improved from previous. Following a discussion of risks / benefits of each options, he wished to proceed with colonoscopy. Further recommendations pending this results.   Hilltop Cellar, MD [facility] Pager [phone]  CC: Hamrick, Lorin Mercy, MD

## 2016-05-29 NOTE — Patient Instructions (Addendum)
You have been scheduled for a colonoscopy. Please follow written instructions given to you at your visit today.  Please pick up your prep supplies at the pharmacy within the next 1-3 days. If you use inhalers (even only as needed), please bring them with you on the day of your procedure. Your physician has requested that you go to www.startemmi.com and enter the access code given to you at your visit today. This web site gives a general overview about your procedure. However, you should still follow specific instructions given to you by our office regarding your preparation for the procedure.  If you are age 79 or older, your body mass index should be between 23-30. Your Body mass index is 36.07 kg/(m^2). If this is out of the aforementioned range listed, please consider follow up with your Primary Care Provider.  If you are age 27 or younger, your body mass index should be between 19-25. Your Body mass index is 36.07 kg/(m^2). If this is out of the aformentioned range listed, please consider follow up with your Primary Care Provider.   We have sent the following medications to your pharmacy for you to pick up at your convenience: Suprep  Thank you for choosing [facility]  Dr Doyne Keel

## 2016-06-05 ENCOUNTER — Other Ambulatory Visit: Payer: Self-pay | Admitting: Orthopaedic Surgery

## 2016-06-05 DIAGNOSIS — M25512 Pain in left shoulder: Secondary | ICD-10-CM

## 2016-06-10 ENCOUNTER — Ambulatory Visit
Admission: RE | Admit: 2016-06-10 | Discharge: 2016-06-10 | Disposition: A | Discharge status: Home / Self Care | Payer: Managed Care, Other (non HMO) | Source: Ambulatory Visit | Attending: Orthopaedic Surgery | Admitting: Orthopaedic Surgery

## 2016-06-10 DIAGNOSIS — M25512 Pain in left shoulder: Secondary | ICD-10-CM

## 2016-07-04 ENCOUNTER — Encounter: Payer: Self-pay | Admitting: Gastroenterology

## 2016-07-09 ENCOUNTER — Other Ambulatory Visit: Payer: Self-pay | Admitting: Endocrinology

## 2016-07-13 ENCOUNTER — Other Ambulatory Visit: Payer: Self-pay | Admitting: Endocrinology

## 2016-07-18 ENCOUNTER — Encounter: Payer: Managed Care, Other (non HMO) | Admitting: Gastroenterology

## 2016-07-20 HISTORY — PX: ROTATOR CUFF REPAIR: SHX139

## 2016-07-24 ENCOUNTER — Encounter: Payer: Self-pay | Admitting: Gastroenterology

## 2016-07-24 ENCOUNTER — Ambulatory Visit (AMBULATORY_SURGERY_CENTER): Payer: Managed Care, Other (non HMO) | Admitting: Gastroenterology

## 2016-07-24 VITALS — BP 141/70 | HR 64 | Temp 98.2°F | Resp 11 | Ht 71.0 in | Wt 258.0 lb

## 2016-07-24 DIAGNOSIS — D128 Benign neoplasm of rectum: Secondary | ICD-10-CM

## 2016-07-24 DIAGNOSIS — D125 Benign neoplasm of sigmoid colon: Secondary | ICD-10-CM | POA: Diagnosis not present

## 2016-07-24 DIAGNOSIS — K621 Rectal polyp: Secondary | ICD-10-CM

## 2016-07-24 DIAGNOSIS — Z1211 Encounter for screening for malignant neoplasm of colon: Secondary | ICD-10-CM

## 2016-07-24 DIAGNOSIS — D120 Benign neoplasm of cecum: Secondary | ICD-10-CM

## 2016-07-24 DIAGNOSIS — D127 Benign neoplasm of rectosigmoid junction: Secondary | ICD-10-CM

## 2016-07-24 DIAGNOSIS — D129 Benign neoplasm of anus and anal canal: Secondary | ICD-10-CM

## 2016-07-24 DIAGNOSIS — D123 Benign neoplasm of transverse colon: Secondary | ICD-10-CM | POA: Diagnosis not present

## 2016-07-24 LAB — GLUCOSE, CAPILLARY
Glucose-Capillary: 65 mg/dL (ref 65–99)
Glucose-Capillary: 66 mg/dL (ref 65–99)
Glucose-Capillary: 109 mg/dL — ABNORMAL HIGH (ref 65–99)

## 2016-07-24 MED ORDER — SODIUM CHLORIDE 0.9 % IV SOLN: 500.0000 mL | INTRAVENOUS | Status: DC

## 2016-07-24 NOTE — Progress Notes (Signed)
2nd blood glucose 64. Spoke with Dr. Wilma Flavin and Osvaldo Angst CRNA regarding blood sugar results. Patient is asympomatic. Decision to proceed with procedure. D5W continues. Informed Recovery nurse Ernestine Conrad RN. Patient also stating his colon prep  results only liquid, translucent with brown particulate. Dr. Wilma Flavin informed.

## 2016-07-24 NOTE — Patient Instructions (Signed)
Impression/recommendations:  Polyps (handout given) Diverticulosis (handout given) High Fiber Diet (handout given) Hemorrhoids (handout given)  YOU HAD AN ENDOSCOPIC PROCEDURE TODAY AT THE [facility]:   Refer to the procedure report that was given to you for any specific questions about what was found during the examination.  If the procedure report does not answer your questions, please call your gastroenterologist to clarify.  If you requested that your care partner not be given the details of your procedure findings, then the procedure report has been included in a sealed envelope for you to review at your convenience later.  YOU SHOULD EXPECT: Some feelings of bloating in the abdomen. Passage of more gas than usual.  Walking can help get rid of the air that was put into your GI tract during the procedure and reduce the bloating. If you had a lower endoscopy (such as a colonoscopy or flexible sigmoidoscopy) you may notice spotting of blood in your stool or on the toilet paper. If you underwent a bowel prep for your procedure, you may not have a normal bowel movement for a few days.  Please Note:  You might notice some irritation and congestion in your nose or some drainage.  This is from the oxygen used during your procedure.  There is no need for concern and it should clear up in a day or so.  SYMPTOMS TO REPORT IMMEDIATELY:   Following lower endoscopy (colonoscopy or flexible sigmoidoscopy):  Excessive amounts of blood in the stool  Significant tenderness or worsening of abdominal pains  Swelling of the abdomen that is new, acute  Fever of 100F or higher  For urgent or emergent issues, a gastroenterologist can be reached at any hour by calling [phone].   DIET:  We do recommend a small meal at first, but then you may proceed to your regular diet.  Drink plenty of fluids but you should avoid alcoholic beverages for 24 hours.  ACTIVITY:  You should plan to take it  easy for the rest of today and you should NOT DRIVE or use heavy machinery until tomorrow (because of the sedation medicines used during the test).    FOLLOW UP: Our staff will call the number listed on your records the next business day following your procedure to check on you and address any questions or concerns that you may have regarding the information given to you following your procedure. If we do not reach you, we will leave a message.  However, if you are feeling well and you are not experiencing any problems, there is no need to return our call.  We will assume that you have returned to your regular daily activities without incident.  If any biopsies were taken you will be contacted by phone or by letter within the next 1-3 weeks.  Please call us at [phone] if you have not heard about the biopsies in 3 weeks.    SIGNATURES/CONFIDENTIALITY: You and/or your care partner have signed paperwork which will be entered into your electronic medical record.  These signatures attest to the fact that that the information above on your After Visit Summary has been reviewed and is understood.  Full responsibility of the confidentiality of this discharge information lies with you and/or your care-partner. 

## 2016-07-24 NOTE — Progress Notes (Signed)
A/ox3 pleased with MAC, report to Tracy RN 

## 2016-07-24 NOTE — Op Note (Signed)
[place] Patient Name: Matthew Schmitt Procedure Date: 07/24/2016 11:22 AM MRN: [id number] Endoscopist: Remo Lipps P. Havery Moros , MD Age: 65 Referring MD:  Date of Birth: Oct 24, 1951 Gender: Male Account #: [id number] Procedure:                Colonoscopy Indications:              Screening for malignant neoplasm in the colon, This                            is the patient's first colonoscopy Medicines:                Monitored Anesthesia Care Procedure:                Pre-Anesthesia Assessment:                           - Prior to the procedure, a History and Physical                            was performed, and patient medications and                            allergies were reviewed. The patient's tolerance of                            previous anesthesia was also reviewed. The risks                            and benefits of the procedure and the sedation                            options and risks were discussed with the patient.                            All questions were answered, and informed consent                            was obtained. Prior Anticoagulants: The patient has                            taken aspirin, last dose was 1 day prior to                            procedure. ASA Grade Assessment: III - A patient                            with severe systemic disease. After reviewing the                            risks and benefits, the patient was deemed in                            satisfactory condition to undergo the procedure.  After obtaining informed consent, the colonoscope                            was passed under direct vision. Throughout the                            procedure, the patient's blood pressure, pulse, and                            oxygen saturations were monitored continuously. The                            [id number]) was introduced through the anus                            and advanced to  the the cecum, identified by                            appendiceal orifice and ileocecal valve. The                            colonoscopy was performed without difficulty. The                            patient tolerated the procedure well. The quality                            of the bowel preparation was adequate. The                            ileocecal valve, appendiceal orifice, and rectum                            were photographed. Scope In: 11:25:08 AM Scope Out: 11:59:36 AM Scope Withdrawal Time: 0 hours 30 minutes 37 seconds  Total Procedure Duration: 0 hours 34 minutes 28 seconds  Findings:                 The perianal exam findings include a perianal rash                            in the gluteal cleft, potentially consistent with                            psoriasis.                           A 3 mm polyp was found in the cecum. The polyp was                            sessile. The polyp was removed with a cold snare.                            Resection and retrieval were complete.  A 5 mm polyp was found in the hepatic flexure. The                            polyp was sessile. The polyp was removed with a                            cold snare. Resection and retrieval were complete.                           A 18-20 mm polyp was found in the transverse colon.                            The polyp was semi-pedunculated. The polyp was                            removed with a hot snare. Resection and retrieval                            were complete.                           A 10 mm polyp was found in the transverse colon.                            The polyp was pedunculated. The polyp was removed                            with a hot snare. Resection and retrieval were                            complete.                           Two sessile polyps were found in the transverse                            colon. The polyps were 4 to 6 mm in  size. These                            polyps were removed with a cold snare. Resection                            and retrieval were complete.                           A 5 mm polyp was found in the sigmoid colon. The                            polyp was sessile. The polyp was removed with a                            cold snare. Resection and retrieval were complete.  A 5 mm polyp was found in the recto-sigmoid colon.                            The polyp was sessile. The polyp was removed with a                            cold snare. Resection and retrieval were complete.                           A 4 mm polyp was found in the rectum. The polyp was                            sessile. The polyp was removed with a cold snare.                            Resection and retrieval were complete.                           A few small-mouthed diverticula were found in the                            left colon.                           Non-bleeding internal hemorrhoids were found during                            retroflexion.                           The exam was otherwise without abnormality. The                            bowel prep was only fair, however following several                            minutes of lavage, adequate views were obtained for                            a screening exam. Complications:            No immediate complications. Estimated blood loss:                            Minimal. Estimated Blood Loss:     Estimated blood loss was minimal. Impression:               - Perianal rash found on perianal exam potentially                            consistent with psoriasis.                           - One 3 mm polyp in the cecum, removed with a cold  snare. Resected and retrieved.                           - One 5 mm polyp at the hepatic flexure, removed                            with a cold snare. Resected and  retrieved.                           - One 18-20 mm polyp in the transverse colon,                            removed with a hot snare. Resected and retrieved.                           - One 10 mm polyp in the transverse colon, removed                            with a hot snare. Resected and retrieved.                           - Two 4 to 6 mm polyps in the transverse colon,                            removed with a cold snare. Resected and retrieved.                           - One 5 mm polyp in the sigmoid colon, removed with                            a cold snare. Resected and retrieved.                           - One 5 mm polyp at the recto-sigmoid colon,                            removed with a cold snare. Resected and retrieved.                           - One 4 mm polyp in the rectum, removed with a cold                            snare. Resected and retrieved.                           - Diverticulosis in the left colon.                           - Non-bleeding internal hemorrhoids.                           - The examination was otherwise normal. Recommendation:           -  Patient has a contact number available for                            emergencies. The signs and symptoms of potential                            delayed complications were discussed with the                            patient. Return to normal activities tomorrow.                            Written discharge instructions were provided to the                            patient.                           - Resume previous diet.                           - Continue present medications.                           - No ibuprofen, naproxen, or other non-steroidal                            anti-inflammatory drugs for 2 weeks after polyp                            removal.                           - Await pathology results.                           - Repeat colonoscopy is recommended for                             surveillance. The colonoscopy date will be                            determined after pathology results from today's                            exam become available for review. Remo Lipps P. Armbruster, MD 07/24/2016 12:09:07 PM This report has been signed electronically.

## 2016-07-24 NOTE — Progress Notes (Signed)
Patient 7/10 pain, bil knee pain,constant pain, affects daily living by restricting recreational activities. takes tylenol for discomfort. Surgery is  Planned for this winter.

## 2016-07-24 NOTE — Progress Notes (Signed)
Called to room to assist during endoscopic procedure.  Patient ID and intended procedure confirmed with present staff. Received instructions for my participation in the procedure from the performing physician.  

## 2016-07-25 ENCOUNTER — Telehealth: Payer: Self-pay | Admitting: *Deleted

## 2016-07-25 NOTE — Telephone Encounter (Signed)
  Follow up Call-  Call back number 07/24/2016  Post procedure Call Back phone  # [phone]  Permission to leave phone message Yes  Some recent data might be hidden     Patient questions:  Do you have a fever, pain , or abdominal swelling? No. Pain Score  0 *  Have you tolerated food without any problems? Yes.    Have you been able to return to your normal activities? Yes.    Do you have any questions about your discharge instructions: Diet   No. Medications  No. Follow up visit  No.  Do you have questions or concerns about your Care? No.  Actions: * If pain score is 4 or above: No action needed, pain <4. Patient states his "joints hurt yesterday".

## 2016-07-27 ENCOUNTER — Ambulatory Visit: Payer: Managed Care, Other (non HMO) | Admitting: Endocrinology

## 2016-07-31 ENCOUNTER — Encounter: Payer: Self-pay | Admitting: Gastroenterology

## 2016-08-09 ENCOUNTER — Ambulatory Visit: Payer: Managed Care, Other (non HMO) | Admitting: Endocrinology

## 2016-08-28 ENCOUNTER — Ambulatory Visit (INDEPENDENT_AMBULATORY_CARE_PROVIDER_SITE_OTHER): Payer: Managed Care, Other (non HMO) | Admitting: Endocrinology

## 2016-08-28 ENCOUNTER — Encounter: Payer: Self-pay | Admitting: Endocrinology

## 2016-08-28 VITALS — BP 134/86 | HR 74 | Ht 71.0 in | Wt 257.0 lb

## 2016-08-28 DIAGNOSIS — E1021 Type 1 diabetes mellitus with diabetic nephropathy: Secondary | ICD-10-CM

## 2016-08-28 LAB — POCT GLYCOSYLATED HEMOGLOBIN (HGB A1C): Hemoglobin A1C: 7.4

## 2016-08-28 NOTE — Patient Instructions (Addendum)
check your blood sugar once a day.  vary the time of day when you check, between before the 3 meals, and at bedtime.  also check if you have symptoms of your blood sugar being too high or too low.  please keep a record of the readings and bring it to your next appointment here.  please call us sooner if you are having low blood sugar episodes, or if it is running above 150.  On this type of insulin schedule, you should eat meals on a regular schedule.  If a meal is missed or significantly delayed, your blood sugar could go low.  Please continue the same insulin for now.  Please come back for a follow-up appointment in 4 months.

## 2016-08-28 NOTE — Progress Notes (Signed)
Subjective:    Patient ID: Matthew Schmitt, male    DOB: [birth year], 65 y.o.   MRN: [id number]  HPI Pt returns for f/u of diabetes mellitus: DM type: Insulin-requiring type 2. Dx'ed: 2002.   Complications: nephropathy, leg ulcers, and CAD.  Therapy: insulin since 2015.   DKA: never.   Severe hypoglycemia: once, in 2017 Pancreatitis: never.   Other: he declines multiple daily injections; he has severe insulin resistance.  In early 2017, he requested to change levemir to lantus, due to high dosage requirement.  Interval history: no cbg record, but states cbg's vary from 75-150.  It is in general lowest in the afternoon, after he misses lunch.   Past Medical History:  Diagnosis Date  . Arthritis    everywhere  . Bladder calculus   . Cancer ([place])   . Coronary artery disease    cardiologist-  dr Wynonia Lawman  . Essential hypertension, benign   . First degree heart block   . Heart murmur   . History of kidney stones   . History of melanoma excision    June 2016  --- s/p  excision bilateral leg   . History of renal cell carcinoma    s/p  left partial nephrectomy  . Impotence of organic origin   . Lower urinary tract symptoms (LUTS)   . Mixed hyperlipidemia    slightly  . Multiple renal cysts    bilateral  . PAF (paroxysmal atrial fibrillation) ([place])   . Presence of surgical incision    x2  bilateral leg and right thigh  . RBBB   . Type 2 diabetes mellitus ([place])   . Wears glasses     Past Surgical History:  Procedure Laterality Date  . CARDIAC CATHETERIZATION  08-17-2011  DR SPENCER TILLEY   POSSIBLE MODERATE LAD STENOSIS JUST AFTER THE BIFURCATION OF LARGE DIAGONAL BRANCH/ LVEF  40-45%/ MILD GLOBAL HYPODINESIS (CATH DONE FOR ABNORMAL STRESS TEST OF FIXED LATERAL WALL DEFECT & BIFASCICULAR BLAOCK)  . CARDIOVERSION N/A 04/02/2013   Procedure: CARDIOVERSION;  Surgeon: Jacolyn Reedy, MD;  Location: [facility];  Service: Cardiovascular;  Laterality: N/A;  . CYSTOSCOPY WITH LITHOLAPAXY  N/A 05/18/2015   Procedure: CYSTOSCOPY WITH LITHOLAPAXY;  Surgeon: Rana Snare, MD;  Location[facility];  Service: Urology;  Laterality: N/A;  . CYSTOSCOPY WITH RETROGRADE PYELOGRAM, URETEROSCOPY AND STENT PLACEMENT  11/03/2012   Procedure: CYSTOSCOPY WITH RETROGRADE PYELOGRAM, URETEROSCOPY AND STENT PLACEMENT;  Surgeon: Bernestine Amass, MD;  Location: [facility];  Service: Urology;  Laterality: Left;  Cystoscopy/Left Retrograde/Ureteroscopy/Holmium Laser Litho/Double J Stent  . HOLMIUM LASER APPLICATION  123456   Procedure: HOLMIUM LASER APPLICATION;  Surgeon: Bernestine Amass, MD;  Location: [facility];  Service: Urology;  Laterality: Left;  . HOLMIUM LASER APPLICATION N/A 0000000   Procedure: HOLMIUM LASER APPLICATION;  Surgeon: Rana Snare, MD;  Location: [facility];  Service: Urology;  Laterality: N/A;  . KNEE SURGERY Bilateral [place]  . MELANOMA EXCISION Bilateral June 2016   20th-- left leg //  1st-- right leg w/ skin graft from right thigh (no lymph node bx)  . NEPHROLITHOTOMY Right 05/17/2014   Procedure: NEPHROLITHOTOMY PERCUTANEOUS;  Surgeon: Bernestine Amass, MD;  Location: [facility];  Service: Urology;  Laterality: Right;  . PILONIDAL CYST EXCISION    . ROBOTIC ASSITED PARTIAL NEPHRECTOMY Left 06/18/2013   Procedure: ROBOTIC ASSISTED PARTIAL NEPHRECTOMY;  Surgeon: Dutch Gray, MD;  Location: [facility];  Service: Urology;  Laterality: Left;  . TONSILLECTOMY  67  (age 89)    Social History   Social History  . Marital status: Married    Spouse name: N/A  . Number of children: 2  . Years of education: 16   Occupational History  . Government social research officer, Psychologist, occupational    Social History Main Topics  . Smoking status: Former Smoker    Packs/day: 1.00    Years: 6.00    Types: Cigarettes    Quit date: 10/31/1969  . Smokeless tobacco: Never Used  . Alcohol use 0.0 oz/week     Comment: OCCASIONAL  . Drug use: No   . Sexual activity: No   Other Topics Concern  . Not on file   Social History Narrative   Regular exercise-no    Current Outpatient Prescriptions on File Prior to Visit  Medication Sig Dispense Refill  . amLODipine (NORVASC) 5 MG tablet Take 5 mg by mouth every morning. Patient takes in am    . aspirin 81 MG tablet Take 81 mg by mouth daily.    Marland Kitchen doxazosin (CARDURA) 8 MG tablet Take 8 mg by mouth at bedtime.     . Insulin Glargine (LANTUS SOLOSTAR) 100 UNIT/ML Solostar Pen Inject 300 Units into the skin every morning. (Patient taking differently: Inject 200 Units into the skin every morning. ) 35 pen 11  . metoprolol tartrate (LOPRESSOR) 25 MG tablet Take 25 mg by mouth 2 (two) times daily.     . ONE TOUCH ULTRA TEST test strip USE 1 EACH 2 TIMES DAILY                              . 150 each 10  . potassium citrate (UROCIT-K) 10 MEQ (1080 MG) SR tablet Take 2 tablets (20 mEq total) by mouth 2 (two) times daily. (Patient taking differently: Take by mouth 2 (two) times daily. ) 120 tablet 60  . pravastatin (PRAVACHOL) 40 MG tablet Take 40 mg by mouth every evening.     . Tamsulosin HCl (FLOMAX) 0.4 MG CAPS Take 0.4 mg by mouth as directed. Daily    . valsartan-hydrochlorothiazide (DIOVAN-HCT) 320-25 MG per tablet Take 1 tablet by mouth every evening.      Current Facility-Administered Medications on File Prior to Visit  Medication Dose Route Frequency Provider Last Rate Last Dose  . 0.9 %  sodium chloride infusion  500 mL Intravenous Continuous Manus Gunning, MD        Allergies  Allergen Reactions  . Actos [Pioglitazone] Swelling    edema    Family History  Problem Relation Age of Onset  . Arthritis Father   . Hypertension Father   . Diabetes Father   . Arthritis Mother   . Hypertension Mother   . Heart attack Brother 34  . Diabetes Brother   . Lymphoma Paternal Grandfather   . Cancer Maternal Uncle     x 4  . Cancer Paternal Uncle     type unknown    BP  134/86   Pulse 74   Ht 5\' 11"  (1.803 m)   Wt 257 lb (116.6 kg)   SpO2 97%   BMI 35.84 kg/m     Review of Systems He denies hypoglycemia    Objective:   Physical Exam VITAL SIGNS:  See vs page GENERAL: no distress Pulses: dorsalis pedis intact bilat.  Feet: no deformity. feet are of normal color  and temp. 1+ bilat edema, healed ulcers, and severe hyperpigmentation of the ant tibial areas. The left posterior tibial area has a healed ulcer. There is severe bilateral onychomycosis of the toenails.  Skin: no ulceron the feet.  Neuro: sensation is intact to touch on the feet.    Lab Results  Component Value Date   HGBA1C 7.4 08/28/2016      Assessment & Plan:  Insulin-requiring type 2 DM: well-controlled Patient is advised the following: Patient Instructions  check your blood sugar once a day.  vary the time of day when you check, between before the 3 meals, and at bedtime.  also check if you have symptoms of your blood sugar being too high or too low.  please keep a record of the readings and bring it to your next appointment here.  please call us sooner if you are having low blood sugar episodes, or if it is running above 150.  On this type of insulin schedule, you should eat meals on a regular schedule.  If a meal is missed or significantly delayed, your blood sugar could go low.  Please continue the same insulin for now.  Please come back for a follow-up appointment in 4 months.

## 2016-09-14 ENCOUNTER — Encounter (INDEPENDENT_AMBULATORY_CARE_PROVIDER_SITE_OTHER): Payer: Self-pay | Admitting: Orthopaedic Surgery

## 2016-09-14 ENCOUNTER — Ambulatory Visit (INDEPENDENT_AMBULATORY_CARE_PROVIDER_SITE_OTHER): Payer: Managed Care, Other (non HMO) | Admitting: Orthopaedic Surgery

## 2016-09-14 DIAGNOSIS — M75102 Unspecified rotator cuff tear or rupture of left shoulder, not specified as traumatic: Secondary | ICD-10-CM

## 2016-09-14 NOTE — Progress Notes (Signed)
Office Visit Note   Patient: Matthew Schmitt           Date of Birth: 1951/06/29           MRN: [id number] Visit Date: 09/14/2016              Requested by: Leonides Sake, MD [place],  [id number] PCP: Leonides Sake, MD   Assessment & Plan: Visit Diagnoses:  1. Rotator cuff syndrome of left shoulder     Plan:  - patient is doing very well - continue progressing with PT - may do 1x a week and HEP, increase weight bearing as tolerated - f/u 6 weeks for recheck - patient will likely schedule TKA in early 2018  Follow-Up Instructions: Return in about 6 weeks (around 10/26/2016) for recheck left shoulder.   Orders:  No orders of the defined types were placed in this encounter.  No orders of the defined types were placed in this encounter.     Procedures: No procedures performed   Clinical Data: No additional findings.   Subjective: Chief Complaint  Patient presents with  . Left Shoulder - Routine Post Op, Follow-up    6 week postop visit for rotator cuff repair.  Doing very well.  Doing PT 2x a week in [place].  Has had very little pain and ROM has been progressing well.  Overall improving.      Review of Systems   Objective: Vital Signs: There were no vitals taken for this visit.  Physical Exam  Left Shoulder Exam   Tenderness  The patient is experiencing no tenderness.     Range of Motion  The patient has normal left shoulder ROM.  Muscle Strength  Abduction: 4/5  External Rotation: 4/5  Supraspinatus: 4/5       Specialty Comments:  No specialty comments available.  Imaging: No results found.   PMFS History: Patient Active Problem List   Diagnosis Date Noted  . Rotator cuff syndrome of left shoulder 04/17/2016  . Nephrolithiasis 05/17/2014  . Encounter for long-term (current) use of other medications 07/27/2013  . Diabetes mellitus with renal manifestation ([place]) 04/22/2013  . Long-term (current) use of  anticoagulants   . CAD (coronary artery disease) 04/02/2013  . Hypertensive heart disease 04/02/2013  . 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 Atrial flutter ([place])   . Nonischemic cardiomyopathy ([place])   . Obesity (BMI 30-39.9)   . History of kidney stones   . Osteoarthritis   . Hyperlipidemia    Past Medical History:  Diagnosis Date  . Arthritis    everywhere  . Bladder calculus   . Cancer ([place])   . Coronary artery disease    cardiologist-  dr Wynonia Lawman  . Essential hypertension, benign   . First degree heart block   . Heart murmur   . History of kidney stones   . History of melanoma excision    June 2016  --- s/p  excision bilateral leg   . History of renal cell carcinoma    s/p  left partial nephrectomy  . Impotence of organic origin   . Lower urinary tract symptoms (LUTS)   . Mixed hyperlipidemia    slightly  . Multiple renal cysts    bilateral  . PAF (paroxysmal atrial fibrillation) ([place])   . Presence of surgical incision    x2  bilateral leg and right thigh  . RBBB   . Type 2 diabetes  mellitus ([place])   . Wears glasses     Family History  Problem Relation Age of Onset  . Arthritis Father   . Hypertension Father   . Diabetes Father   . Arthritis Mother   . Hypertension Mother   . Heart attack Brother 21  . Diabetes Brother   . Lymphoma Paternal Grandfather   . Cancer Maternal Uncle     x 4  . Cancer Paternal Uncle     type unknown    Past Surgical History:  Procedure Laterality Date  . CARDIAC CATHETERIZATION  08-17-2011  DR SPENCER TILLEY   POSSIBLE MODERATE LAD STENOSIS JUST AFTER THE BIFURCATION OF LARGE DIAGONAL BRANCH/ LVEF  40-45%/ MILD GLOBAL HYPODINESIS (CATH DONE FOR ABNORMAL STRESS TEST OF FIXED LATERAL WALL DEFECT & BIFASCICULAR BLAOCK)  . CARDIOVERSION N/A 04/02/2013   Procedure: CARDIOVERSION;  Surgeon: Jacolyn Reedy, MD;  Location: [place];  Service: Cardiovascular;  Laterality: N/A;  . CYSTOSCOPY  WITH LITHOLAPAXY N/A 05/18/2015   Procedure: CYSTOSCOPY WITH LITHOLAPAXY;  Surgeon: Rana Snare, MD;  Location[facility];  Service: Urology;  Laterality: N/A;  . CYSTOSCOPY WITH RETROGRADE PYELOGRAM, URETEROSCOPY AND STENT PLACEMENT  11/03/2012   Procedure: CYSTOSCOPY WITH RETROGRADE PYELOGRAM, URETEROSCOPY AND STENT PLACEMENT;  Surgeon: Bernestine Amass, MD;  Location: [facility];  Service: Urology;  Laterality: Left;  Cystoscopy/Left Retrograde/Ureteroscopy/Holmium Laser Litho/Double J Stent  . HOLMIUM LASER APPLICATION  123456   Procedure: HOLMIUM LASER APPLICATION;  Surgeon: Bernestine Amass, MD;  Location: [facility];  Service: Urology;  Laterality: Left;  . HOLMIUM LASER APPLICATION N/A 0000000   Procedure: HOLMIUM LASER APPLICATION;  Surgeon: Rana Snare, MD;  Location: [facility];  Service: Urology;  Laterality: N/A;  . KNEE SURGERY Bilateral [place]  . MELANOMA EXCISION Bilateral June 2016   20th-- left leg //  1st-- right leg w/ skin graft from right thigh (no lymph node bx)  . NEPHROLITHOTOMY Right 05/17/2014   Procedure: NEPHROLITHOTOMY PERCUTANEOUS;  Surgeon: Bernestine Amass, MD;  Location: [facility];  Service: Urology;  Laterality: Right;  . PILONIDAL CYST EXCISION    . ROBOTIC ASSITED PARTIAL NEPHRECTOMY Left 06/18/2013   Procedure: ROBOTIC ASSISTED PARTIAL NEPHRECTOMY;  Surgeon: Dutch Gray, MD;  Location: [facility];  Service: Urology;  Laterality: Left;  . TONSILLECTOMY  23  (age 55)   Social History   Occupational History  . Government social research officer, Psychologist, occupational    Social History Main Topics  . Smoking status: Former Smoker    Packs/day: 1.00    Years: 6.00    Types: Cigarettes    Quit date: 10/31/1969  . Smokeless tobacco: Never Used  . Alcohol use 0.0 oz/week     Comment: OCCASIONAL  . Drug use: No  . Sexual activity: No

## 2016-09-24 ENCOUNTER — Ambulatory Visit (INDEPENDENT_AMBULATORY_CARE_PROVIDER_SITE_OTHER): Payer: Managed Care, Other (non HMO) | Admitting: Physician Assistant

## 2016-09-24 ENCOUNTER — Ambulatory Visit (HOSPITAL_COMMUNITY)
Admission: RE | Admit: 2016-09-24 | Discharge: 2016-09-24 | Disposition: A | Discharge status: Home / Self Care | Payer: Managed Care, Other (non HMO) | Source: Ambulatory Visit | Attending: Physician Assistant | Admitting: Physician Assistant

## 2016-09-24 ENCOUNTER — Ambulatory Visit (INDEPENDENT_AMBULATORY_CARE_PROVIDER_SITE_OTHER): Payer: Managed Care, Other (non HMO)

## 2016-09-24 VITALS — BP 152/80 | HR 97 | Temp 98.4°F | Resp 17 | Ht 71.0 in | Wt 264.0 lb

## 2016-09-24 DIAGNOSIS — R600 Localized edema: Secondary | ICD-10-CM

## 2016-09-24 DIAGNOSIS — I872 Venous insufficiency (chronic) (peripheral): Secondary | ICD-10-CM

## 2016-09-24 DIAGNOSIS — M7989 Other specified soft tissue disorders: Secondary | ICD-10-CM | POA: Insufficient documentation

## 2016-09-24 DIAGNOSIS — M25571 Pain in right ankle and joints of right foot: Secondary | ICD-10-CM | POA: Insufficient documentation

## 2016-09-24 NOTE — Progress Notes (Signed)
Patient ID: Matthew Schmitt, male    DOB: November 20, 1950, 65 y.o.   MRN: [id number]  PCP: Leonides Sake, MD  Subjective:   Chief Complaint  Patient presents with  . Ankle Pain    Onset 5 days, tender/ Rt ankle  . Leg Swelling    HPI Presents for evaluation of RIGHT ankle pain, swelling and redness x 5 days.  No trauma or injury recalled. No previous trauma. He sat in a long meeting the day of the onset of symptoms, but that isn't atypical for him. He is concerned that he may have a blood clot causing the symptoms.  Ice, acetaminophen and ACE wrap without benefit. Notes that the pain is worst when he first rises and starts to walk and eases with longer walking.  No SOB, CP, nausea/vomiting. He takes a daily ASA 81 mg. He has known diabetes, CAD, RBBB, nonischemic cardiomyopathy, A-Flutter. History of nephrolithiasis, known stones in the renal pelvis. No personal history of gout, but known OA of the knees.    Review of Systems As above.    Patient Active Problem List   Diagnosis Date Noted  . Rotator cuff syndrome of left shoulder 04/17/2016  . Nephrolithiasis 05/17/2014  . Encounter for long-term (current) use of other medications 07/27/2013  . Diabetes mellitus with renal manifestation ([place]) 04/22/2013  . Long-term (current) use of anticoagulants   . CAD (coronary artery disease) 04/02/2013  . Hypertensive heart disease 04/02/2013  . 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 Atrial flutter ([place])   . Nonischemic cardiomyopathy ([place])   . Obesity (BMI 30-39.9)   . History of kidney stones   . Osteoarthritis   . Hyperlipidemia      Prior to Admission medications   Medication Sig Start Date End Date Taking? Authorizing Provider  amLODipine (NORVASC) 5 MG tablet Take 5 mg by mouth every morning. Patient takes in am   Yes Historical Provider, MD  aspirin 81 MG tablet Take 81 mg by mouth daily.   Yes Historical Provider, MD    doxazosin (CARDURA) 8 MG tablet Take 8 mg by mouth at bedtime.    Yes Historical Provider, MD  Insulin Glargine (LANTUS SOLOSTAR) 100 UNIT/ML Solostar Pen Inject 300 Units into the skin every morning. Patient taking differently: Inject 200 Units into the skin every morning.  07/09/16  Yes Renato Shin, MD  metoprolol tartrate (LOPRESSOR) 25 MG tablet Take 25 mg by mouth 2 (two) times daily.    Yes Historical Provider, MD  ONE TOUCH ULTRA TEST test strip USE 1 EACH 2 TIMES DAILY                              . 07/13/16  Yes Renato Shin, MD  potassium citrate (UROCIT-K) 10 MEQ (1080 MG) SR tablet Take 2 tablets (20 mEq total) by mouth 2 (two) times daily. Patient taking differently: Take by mouth daily.  05/18/14  Yes Rana Snare, MD  pravastatin (PRAVACHOL) 40 MG tablet Take 40 mg by mouth every evening.    Yes Historical Provider, MD  Tamsulosin HCl (FLOMAX) 0.4 MG CAPS Take 0.4 mg by mouth as directed. Daily   Yes Historical Provider, MD  valsartan-hydrochlorothiazide (DIOVAN-HCT) 320-25 MG per tablet Take 1 tablet by mouth every evening.    Yes Historical Provider, MD     Allergies  Allergen Reactions  . Actos [Pioglitazone] Swelling    edema  Objective:  Physical Exam  Constitutional: He is oriented to person, place, and time. He appears well-developed and well-nourished. He is active and cooperative. No distress.  BP (!) 152/80 (BP Location: Right Arm, Patient Position: Sitting, Cuff Size: Normal)   Pulse 97   Temp 98.4 F (36.9 C) (Oral)   Resp 17   Ht 5\' 11"  (1.803 m)   Wt 264 lb (119.7 kg)   SpO2 97%   BMI 36.82 kg/m   HENT:  Head: Normocephalic and atraumatic.  Right Ear: Hearing normal.  Left Ear: Hearing normal.  Eyes: Conjunctivae are normal. No scleral icterus.  Neck: Normal range of motion. Neck supple. No thyromegaly present.  Cardiovascular: Normal rate, regular rhythm and normal heart sounds.   Pulses:      Radial pulses are 2+ on the right side, and 2+  on the left side.  Pulmonary/Chest: Effort normal and breath sounds normal.  Lymphadenopathy:       Head (right side): No tonsillar, no preauricular, no posterior auricular and no occipital adenopathy present.       Head (left side): No tonsillar, no preauricular, no posterior auricular and no occipital adenopathy present.    He has no cervical adenopathy.       Right: No supraclavicular adenopathy present.       Left: No supraclavicular adenopathy present.  Neurological: He is alert and oriented to person, place, and time. No sensory deficit.  Skin: Skin is warm, dry and intact. No rash noted. No cyanosis or erythema. Nails show no clubbing.  Psychiatric: He has a normal mood and affect. His speech is normal and behavior is normal.       Dg Ankle Complete Right  Result Date: 09/24/2016 CLINICAL DATA:  Pain and redness.  No recent trauma EXAM: RIGHT ANKLE - COMPLETE 3+ VIEW COMPARISON:  None. FINDINGS: Frontal, oblique, and lateral views were obtained. There is generalized soft tissue swelling. There is evidence of an old fracture along the medial malleolus with remodeling. Tiny calcifications in the lateral malleolar region all represent tiny age uncertain a avulsions in this area. No acute fracture is demonstrable. The ankle mortise appears intact. No joint effusion. There is moderate generalized osteoarthritic change in the ankle joint region. There are inferior and posterior calcaneal spurs. There is plantar calcification posteriorly. IMPRESSION: Evidence of old trauma with remodeling. Osteoarthritic change throughout the ankle joint. Ankle mortise appears grossly intact. There are calcaneal spurs with plantar calcification. Question a degree of chronic plantar fasciitis. Electronically Signed   By: Lowella Grip III M.D.   On: 09/24/2016 09:04       Assessment & Plan:   1. Leg edema, right 2. Acute right ankle pain 3. Venous stasis dermatitis of both lower extremities Concern for  possible DVT given risk factors. Await STAT doppler results. If negative, plan rest, elevation, short course of NSAIDS. - DG Ankle Complete Right; Future - VAS [place] LOWER EXTREMITY VENOUS (DVT); Future    Fara Chute, PA-C Physician Assistant-Certified [facility]

## 2016-09-24 NOTE — Progress Notes (Addendum)
VASCULAR LAB PRELIMINARY  PRELIMINARY  PRELIMINARY  PRELIMINARY  Right lower extremity venous duplex completed.    Preliminary report:  There is no DVT or SVT noted in the right lower extremity.  There is interstitial fluid noted throughout the right calf.  Haizlee Henton, RVT 09/24/2016, 10:18 AM

## 2016-09-24 NOTE — Patient Instructions (Signed)
YOU ARE SCHEDULED FOR AN APPOINTMENT WITH VASCULAR STUDIES AT 10;00 AT [place].ADDRESS IS [place]

## 2016-09-24 NOTE — Progress Notes (Signed)
Subjective:    Patient ID: Matthew Schmitt, male    DOB: [birth year], 65 y.o.   MRN: [id number]  Chief Complaint  Patient presents with  . Ankle Pain    Onset 5 days, tender/ Rt ankle  . Leg Swelling   HPI: Presents for right ankle pain and tenderness with onset 5 days ago. Denies trauma or injury to the site. States he sat in a long meeting the day it started hurting but that is not unusual for his work schedule. States he noticed some increased swelling and redness 3-4 days ago which extended up toward his knee. Pain is increased with his first few steps but noticed that once he starts walking ~50 feet the pain starts to go away and he is able to walk fine. Verbalizes concern for possible blood clot. States he put an ace wrap on it at home which provided some relief. Characterizes it as a sharp and constant pain, rated 7/10 in severity. Tried to use ice and Tylenol at home without relief. Notes some associated stiffness and cramping in right leg. Denies history of gout but does have osteoarthritis in his knees. Plans to schedule knee replacement for Jan. 2018. Notes discoloration of bilateral lower extremities (below the knee) which has been present for many years.   Denies SOB, chest pain, nausea, vomiting, fever, chills. History of cardiac catherization 5-6 years ago, followed by cardiology, last visit over the summer. Takes ASA 81 mg daily. History of insulin-dependent DM, checks blood sugar at home, and states HgA1c was measured as 7.4% last month. Other PMH includes CAD, RBBB, nonischemic cardiomyopathy, A-Flutter. History of nephrolithiasis, follow-up scheduled with [facility] in December.   Review of Systems  Constitutional: Negative for appetite change, chills, diaphoresis, fever and unexpected weight change.  HENT: Negative for congestion, ear pain, rhinorrhea, sinus pain, sinus pressure, sneezing, sore throat and trouble swallowing.   Eyes: Negative for pain, itching and  visual disturbance.  Respiratory: Positive for shortness of breath. Negative for cough, chest tightness and wheezing.   Cardiovascular: Positive for leg swelling. Negative for chest pain and palpitations.  Gastrointestinal: Negative for abdominal pain, blood in stool, constipation, diarrhea, nausea and vomiting.  Genitourinary: Positive for difficulty urinating, frequency and urgency. Negative for dysuria and hematuria.  Musculoskeletal: Positive for gait problem. Negative for back pain.  Skin: Positive for color change.  Neurological: Positive for tremors. Negative for dizziness, syncope, light-headedness, numbness and headaches.  Psychiatric/Behavioral: Negative for dysphoric mood.       Objective:   Physical Exam  Constitutional: He is oriented to person, place, and time. He appears well-developed and well-nourished.  HENT:  Head: Normocephalic and atraumatic.  Eyes: Conjunctivae are normal. Pupils are equal, round, and reactive to light. Right eye exhibits no discharge. Left eye exhibits no discharge. No scleral icterus.  Neck: Normal range of motion. Neck supple. No tracheal deviation present. No thyromegaly present.  Cardiovascular:  Pulses:      Popliteal pulses are 2+ on the right side, and 2+ on the left side.       Dorsalis pedis pulses are 1+ on the right side, and 1+ on the left side.       Posterior tibial pulses are 1+ on the right side, and 1+ on the left side.  Diminished pulses in bilateral lower extremities. Venous stasis dermatitis in bilateral lower extremities.   Pulmonary/Chest: No stridor.  Musculoskeletal:       Right ankle: He exhibits swelling and abnormal pulse. He  exhibits normal range of motion, no ecchymosis and no deformity. Tenderness. No lateral malleolus and no medial malleolus tenderness found. Achilles tendon exhibits no pain and no defect.       Left ankle: Normal. He exhibits normal range of motion, no swelling, no ecchymosis, no deformity and normal  pulse. No tenderness.       Right foot: There is normal range of motion, no tenderness, no bony tenderness, no swelling, normal capillary refill, no crepitus and no deformity.       Left foot: There is normal range of motion, no tenderness, no bony tenderness, no swelling, normal capillary refill, no crepitus and no deformity.       Feet:  Full ROM of right ankle but with increased pain with plantarflexion  Lymphadenopathy:    He has no cervical adenopathy.  Neurological: He is alert and oriented to person, place, and time.  Reflex Scores:      Patellar reflexes are 1+ on the right side and 1+ on the left side.      Achilles reflexes are 1+ on the right side and 1+ on the left side. Skin: Skin is warm, dry and intact. There is erythema.     Psychiatric: He has a normal mood and affect. His behavior is normal.    Dg Ankle Complete Right  Result Date: 09/24/2016 CLINICAL DATA:  Pain and redness.  No recent trauma EXAM: RIGHT ANKLE - COMPLETE 3+ VIEW COMPARISON:  None. FINDINGS: Frontal, oblique, and lateral views were obtained. There is generalized soft tissue swelling. There is evidence of an old fracture along the medial malleolus with remodeling. Tiny calcifications in the lateral malleolar region all represent tiny age uncertain a avulsions in this area. No acute fracture is demonstrable. The ankle mortise appears intact. No joint effusion. There is moderate generalized osteoarthritic change in the ankle joint region. There are inferior and posterior calcaneal spurs. There is plantar calcification posteriorly. IMPRESSION: Evidence of old trauma with remodeling. Osteoarthritic change throughout the ankle joint. Ankle mortise appears grossly intact. There are calcaneal spurs with plantar calcification. Question a degree of chronic plantar fasciitis. Electronically Signed   By: Lowella Grip III M.D.   On: 09/24/2016 09:04        Assessment & Plan:  1. Leg edema, right, acute right ankle  pain, venous stasis dermatitis of both lower extremities Given patient's symptoms, concern raised for potential DVT. Scheduled for today at [facility] and instructed patient on this. Discussed that if results are negative, plan to rest, elevate, and use of NSAIDs for pain relief.

## 2016-10-26 ENCOUNTER — Encounter (INDEPENDENT_AMBULATORY_CARE_PROVIDER_SITE_OTHER): Payer: Self-pay | Admitting: Orthopaedic Surgery

## 2016-10-26 ENCOUNTER — Ambulatory Visit (INDEPENDENT_AMBULATORY_CARE_PROVIDER_SITE_OTHER): Payer: Managed Care, Other (non HMO) | Admitting: Orthopaedic Surgery

## 2016-10-26 DIAGNOSIS — M75102 Unspecified rotator cuff tear or rupture of left shoulder, not specified as traumatic: Secondary | ICD-10-CM | POA: Diagnosis not present

## 2016-10-26 NOTE — Progress Notes (Signed)
Matthew Schmitt almost 3 months status post left shoulder scope and rotator cuff repair. He is doing well with physical therapy. Occasionally have some popping but overall he has mild pain with certain movements. Physical exam shows good rotator cuff strength with slight discomfort with empty can testing. Infraspinatus is very strong. Normal range of motion. Plan at this point is to discontinue physical therapy and continue home exercises. He will appointment in January to discuss knee replacement.

## 2016-11-23 ENCOUNTER — Encounter: Payer: Self-pay | Admitting: Endocrinology

## 2016-11-23 ENCOUNTER — Ambulatory Visit (INDEPENDENT_AMBULATORY_CARE_PROVIDER_SITE_OTHER): Payer: Managed Care, Other (non HMO) | Admitting: Endocrinology

## 2016-11-23 VITALS — BP 116/74 | HR 76 | Ht 71.0 in | Wt 256.0 lb

## 2016-11-23 DIAGNOSIS — E1021 Type 1 diabetes mellitus with diabetic nephropathy: Secondary | ICD-10-CM | POA: Diagnosis not present

## 2016-11-23 LAB — POCT GLYCOSYLATED HEMOGLOBIN (HGB A1C): Hemoglobin A1C: 8.2

## 2016-11-23 MED ORDER — BASAGLAR KWIKPEN 100 UNIT/ML ~~LOC~~ SOPN: 240.0000 [IU] | PEN_INJECTOR | SUBCUTANEOUS | 3 refills | Status: DC

## 2016-11-23 MED ORDER — BASAGLAR KWIKPEN 100 UNIT/ML ~~LOC~~ SOPN: 240.0000 [IU] | PEN_INJECTOR | Freq: Every day | SUBCUTANEOUS | 3 refills | Status: DC

## 2016-11-23 NOTE — Progress Notes (Signed)
Subjective:    Patient ID: Matthew Schmitt, male    DOB: [birth year], 66 y.o.   MRN: [id number]  HPI Pt returns for f/u of diabetes mellitus: DM type: Insulin-requiring type 2. Dx'ed: 2002.   Complications: nephropathy, leg ulcers, and CAD.  Therapy: insulin since 2015.   DKA: never.   Severe hypoglycemia: once, in 2017 Pancreatitis: never.   Other: he declines multiple daily injections; he has severe insulin resistance.  In early 2017, he requested to change levemir to lantus, due to high dosage requirement.  Interval history: He brings a record of his cbg's which I have reviewed today.  He checks fasting only.  It varies from 161-200's.  He takes 200 units qam.   Past Medical History:  Diagnosis Date  . Arthritis    everywhere  . Bladder calculus   . Cancer ([place]) 2014   Melanoma on right leg   . Coronary artery disease    cardiologist-  dr Wynonia Lawman  . Essential hypertension, benign   . First degree heart block   . Heart murmur   . History of kidney stones   . History of melanoma excision    June 2016  --- s/p  excision bilateral leg   . History of renal cell carcinoma    s/p  left partial nephrectomy  . Impotence of organic origin   . Lower urinary tract symptoms (LUTS)   . Mixed hyperlipidemia    slightly  . Multiple renal cysts    bilateral  . PAF (paroxysmal atrial fibrillation) ([place])   . Presence of surgical incision    x2  bilateral leg and right thigh  . RBBB   . Type 2 diabetes mellitus ([place])   . Wears glasses     Past Surgical History:  Procedure Laterality Date  . CARDIAC CATHETERIZATION  08-17-2011  DR SPENCER TILLEY   POSSIBLE MODERATE LAD STENOSIS JUST AFTER THE BIFURCATION OF LARGE DIAGONAL BRANCH/ LVEF  40-45%/ MILD GLOBAL HYPODINESIS (CATH DONE FOR ABNORMAL STRESS TEST OF FIXED LATERAL WALL DEFECT & BIFASCICULAR BLAOCK)  . CARDIOVERSION N/A 04/02/2013   Procedure: CARDIOVERSION;  Surgeon: Jacolyn Reedy, MD;  Location: [place] [facility];  Service:  Cardiovascular;  Laterality: N/A;  . CYSTOSCOPY WITH LITHOLAPAXY N/A 05/18/2015   Procedure: CYSTOSCOPY WITH LITHOLAPAXY;  Surgeon: Rana Snare, MD;  Location: [facility];  Service: Urology;  Laterality: N/A;  . CYSTOSCOPY WITH RETROGRADE PYELOGRAM, URETEROSCOPY AND STENT PLACEMENT  11/03/2012   Procedure: CYSTOSCOPY WITH RETROGRADE PYELOGRAM, URETEROSCOPY AND STENT PLACEMENT;  Surgeon: Bernestine Amass, MD;  Location: [facility];  Service: Urology;  Laterality: Left;  Cystoscopy/Left Retrograde/Ureteroscopy/Holmium Laser Litho/Double J Stent  . HOLMIUM LASER APPLICATION  [id number]   Procedure: HOLMIUM LASER APPLICATION;  Surgeon: Bernestine Amass, MD;  [facility];  Service: Urology;  Laterality: Left;  . HOLMIUM LASER APPLICATION N/A 0000000   Procedure: HOLMIUM LASER APPLICATION;  Surgeon: Rana Snare, MD;  Location: [facility];  Service: Urology;  Laterality: N/A;  . KNEE SURGERY Bilateral [place]  . MELANOMA EXCISION Bilateral June 2016   20th-- left leg //  1st-- right leg w/ skin graft from right thigh (no lymph node bx)  . NEPHROLITHOTOMY Right 05/17/2014   Procedure: NEPHROLITHOTOMY PERCUTANEOUS;  Surgeon: Bernestine Amass, MD;  Location: [facility];  Service: Urology;  Laterality: Right;  . PILONIDAL CYST EXCISION    . ROBOTIC ASSITED PARTIAL NEPHRECTOMY Left 06/18/2013   Procedure:  ROBOTIC ASSISTED PARTIAL NEPHRECTOMY;  Surgeon: Dutch Gray, MD;  Location: [facility];  Service: Urology;  Laterality: Left;  . ROTATOR CUFF REPAIR Left 07/2016  . TONSILLECTOMY  42  (age 33)    Social History   Social History  . Marital status: Married    Spouse name: N/A  . Number of children: 2  . Years of education: 16   Occupational History  . [employer] social research officer, Psychologist, occupational    Social History Main Topics  . Smoking status: Former Smoker    Packs/day: 1.00    Years: 6.00    Types: Cigarettes    Quit date: 10/31/1969    . Smokeless tobacco: Never Used  . Alcohol use 0.0 oz/week     Comment: OCCASIONAL  . Drug use: No  . Sexual activity: No   Other Topics Concern  . Not on file   Social History Narrative   Regular exercise-no    Current Outpatient Prescriptions on File Prior to Visit  Medication Sig Dispense Refill  . amLODipine (NORVASC) 5 MG tablet Take 5 mg by mouth every morning. Patient takes in am    . aspirin 81 MG tablet Take 81 mg by mouth daily.    Marland Kitchen doxazosin (CARDURA) 8 MG tablet Take 8 mg by mouth at bedtime.     . metoprolol tartrate (LOPRESSOR) 25 MG tablet Take 25 mg by mouth 2 (two) times daily.     . ONE TOUCH ULTRA TEST test strip USE 1 EACH 2 TIMES DAILY                              . 150 each 10  . potassium citrate (UROCIT-K) 10 MEQ (1080 MG) SR tablet Take 2 tablets (20 mEq total) by mouth 2 (two) times daily. (Patient taking differently: Take by mouth daily. ) 120 tablet 60  . pravastatin (PRAVACHOL) 40 MG tablet Take 40 mg by mouth every evening.     . Tamsulosin HCl (FLOMAX) 0.4 MG CAPS Take 0.4 mg by mouth as directed. Daily    . valsartan-hydrochlorothiazide (DIOVAN-HCT) 320-25 MG per tablet Take 1 tablet by mouth every evening.      Current Facility-Administered Medications on File Prior to Visit  Medication Dose Route Frequency Provider Last Rate Last Dose  . 0.9 %  sodium chloride infusion  500 mL Intravenous Continuous Manus Gunning, MD        Allergies  Allergen Reactions  . Actos [Pioglitazone] Swelling    edema    Family History  Problem Relation Age of Onset  . Arthritis Father   . Hypertension Father   . Diabetes Father   . Arthritis Mother   . Hypertension Mother   . Heart attack Brother 54  . Diabetes Brother   . Lymphoma Paternal Grandfather   . Cancer Maternal Uncle   . Cancer Paternal Uncle     type unknown    BP 116/74   Pulse 76   Ht 5\' 11"  (1.803 m)   Wt 256 lb (116.1 kg)   SpO2 97%   BMI 35.70 kg/m    Review of  Systems He denies hypoglycemia    Objective:   Physical Exam VITAL SIGNS:  See vs page GENERAL: no distress Pulses: dorsalis pedis intact bilat.  Feet: no deformity. feet are of normal color and temp. There are healed ulcers, and severe hyperpigmentation of the ant tibial areas. The left posterior tibial area has  a healed ulcer. There is severe bilateral onychomycosis of the toenails.  CV: trace bilat leg edema Skin: no ulceron the feet, but the skin is dry.  Neuro: sensation is intact to touch on the feet.   A1c=8.2%    Assessment & Plan:  Insulin-requiring type 2 DM, with CAD, worse.    Patient is advised the following: Patient Instructions  check your blood sugar once a day.  vary the time of day when you check, between before the 3 meals, and at bedtime.  also check if you have symptoms of your blood sugar being too high or too low.  please keep a record of the readings and bring it to your next appointment here.  please call us sooner if you are having low blood sugar episodes, or if it is running above 150.  On this type of insulin schedule, you should eat meals on a regular schedule.  If a meal is missed or significantly delayed, your blood sugar could go low.  Please increase the insulin to 240 units each morning please call [phone], to get an appointment with a new primary doctor.   Please come back for a follow-up appointment in 2 months.

## 2016-11-23 NOTE — Patient Instructions (Addendum)
check your blood sugar once a day.  vary the time of day when you check, between before the 3 meals, and at bedtime.  also check if you have symptoms of your blood sugar being too high or too low.  please keep a record of the readings and bring it to your next appointment here.  please call us sooner if you are having low blood sugar episodes, or if it is running above 150.  On this type of insulin schedule, you should eat meals on a regular schedule.  If a meal is missed or significantly delayed, your blood sugar could go low.  Please increase the insulin to 240 units each morning please call [phone], to get an appointment with a new primary doctor.   Please come back for a follow-up appointment in 2 months.

## 2016-11-29 ENCOUNTER — Ambulatory Visit: Payer: Managed Care, Other (non HMO) | Admitting: Adult Health

## 2016-12-17 ENCOUNTER — Other Ambulatory Visit: Payer: Self-pay | Admitting: Urology

## 2016-12-27 NOTE — Patient Instructions (Addendum)
Matthew Schmitt  12/27/2016   Your procedure is scheduled on: Friday 01/04/2017  Report to [facility] Main  Entrance take [place]  elevators to 3rd floor to  [place] at  [place]  AM.  Call this number if you have problems the morning of surgery [phone]   Remember: ONLY 1 PERSON MAY GO WITH YOU TO SHORT STAY TO GET  READY MORNING OF [place].       How to Manage Your Diabetes Before and After Surgery  Why is it important to control my blood sugar before and after surgery? . Improving blood sugar levels before and after surgery helps healing and can limit problems. . A way of improving blood sugar control is eating a healthy diet by: o  Eating less sugar and carbohydrates o  Increasing activity/exercise o  Talking with your doctor about reaching your blood sugar goals . High blood sugars (greater than 180 mg/dL) can raise your risk of infections and slow your recovery, so you will need to focus on controlling your diabetes during the weeks before surgery. . Make sure that the doctor who takes care of your diabetes knows about your planned surgery including the date and location.  How do I manage my blood sugar before surgery? . Check your blood sugar at least 4 times a day, starting 2 days before surgery, to make sure that the level is not too high or low. o Check your blood sugar the morning of your surgery when you wake up and every 2 hours until you get to the Short Stay unit. . If your blood sugar is less than 70 mg/dL, you will need to treat for low blood sugar: o Do not take insulin. o Treat a low blood sugar (less than 70 mg/dL) with  cup of clear juice (cranberry or apple), 4 glucose tablets, OR glucose gel. o Recheck blood sugar in 15 minutes after treatment (to make sure it is greater than 70 mg/dL). If your blood sugar is not greater than 70 mg/dL on recheck, call [phone] for further instructions. . Report your blood sugar to the short  stay nurse when you get to Short Stay.  . If you are admitted to the hospital after surgery: o Your blood sugar will be checked by the staff and you will probably be given insulin after surgery (instead of oral diabetes medicines) to make sure you have good blood sugar levels. o The goal for blood sugar control after surgery is 80-180 mg/dL.   WHAT DO I DO ABOUT MY DIABETES MEDICATION?  Marland Kitchen Do not take oral diabetes medicines (pills) the morning of surgery.!  .       . THE MORNING OF SURGERY, take 1/2 dose  (120  units )  of    Insulin Glargine-( Lantus  )   Insulin ONLY .if needed if blood glucose is over 120!  Marland Kitchen     Do not eat food or drink liquids :After Midnight.     Take these medicines the morning of surgery with A SIP OF WATER: Amlodipine (Norvasc), Metoprolol                                  You may not have any metal on your body including hair pins and  piercings  Do not wear jewelry, make-up, lotions, powders or perfumes, deodorant             Do not wear nail polish.  Do not shave  48 hours prior to surgery.              Men may shave face and neck.   Do not bring valuables to the hospital. [place].  Contacts, dentures or bridgework may not be worn into surgery.  Leave suitcase in the car. After surgery it may be brought to your room.                  Please read over the following fact sheets you were given: _____________________________________________________________________             [facility] - Preparing for Surgery Before surgery, you can play an important role.  Because skin is not sterile, your skin needs to be as free of germs as possible.  You can reduce the number of germs on your skin by washing with CHG (chlorahexidine gluconate) soap before surgery.  CHG is an antiseptic cleaner which kills germs and bonds with the skin to continue killing germs even after washing. Please DO NOT  use if you have an allergy to CHG or antibacterial soaps.  If your skin becomes reddened/irritated stop using the CHG and inform your nurse when you arrive at Short Stay. Do not shave (including legs and underarms) for at least 48 hours prior to the first CHG shower.  You may shave your face/neck. Please follow these instructions carefully:  1.  Shower with CHG Soap the night before surgery and the  morning of Surgery.  2.  If you choose to wash your hair, wash your hair first as usual with your  normal  shampoo.  3.  After you shampoo, rinse your hair and body thoroughly to remove the  shampoo.                           4.  Use CHG as you would any other liquid soap.  You can apply chg directly  to the skin and wash                       Gently with a scrungie or clean washcloth.  5.  Apply the CHG Soap to your body ONLY FROM THE NECK DOWN.   Do not use on face/ open                           Wound or open sores. Avoid contact with eyes, ears mouth and genitals (private parts).                       Wash face,  Genitals (private parts) with your normal soap.             6.  Wash thoroughly, paying special attention to the area where your surgery  will be performed.  7.  Thoroughly rinse your body with warm water from the neck down.  8.  DO NOT shower/wash with your normal soap after using and rinsing off  the CHG Soap.                9.  Pat yourself  dry with a clean towel.            10.  Wear clean pajamas.            11.  Place clean sheets on your bed the night of your first shower and do not  sleep with pets. Day of Surgery : Do not apply any lotions/deodorants the morning of surgery.  Please wear clean clothes to the hospital/surgery center.  FAILURE TO FOLLOW THESE INSTRUCTIONS MAY RESULT IN THE CANCELLATION OF YOUR SURGERY PATIENT SIGNATURE_________________________________  NURSE  SIGNATURE__________________________________  ________________________________________________________________________  WHAT IS A BLOOD TRANSFUSION? Blood Transfusion Information  A transfusion is the replacement of blood or some of its parts. Blood is made up of multiple cells which provide different functions.  Red blood cells carry oxygen and are used for blood loss replacement.  White blood cells fight against infection.  Platelets control bleeding.  Plasma helps clot blood.  Other blood products are available for specialized needs, such as hemophilia or other clotting disorders. BEFORE THE TRANSFUSION  Who gives blood for transfusions?   Healthy volunteers who are fully evaluated to make sure their blood is safe. This is blood bank blood. Transfusion therapy is the safest it has ever been in the practice of medicine. Before blood is taken from a donor, a complete history is taken to make sure that person has no history of diseases nor engages in risky social behavior (examples are intravenous drug use or sexual activity with multiple partners). The donor's travel history is screened to minimize risk of transmitting infections, such as malaria. The donated blood is tested for signs of infectious diseases, such as HIV and hepatitis. The blood is then tested to be sure it is compatible with you in order to minimize the chance of a transfusion reaction. If you or a relative donates blood, this is often done in anticipation of surgery and is not appropriate for emergency situations. It takes many days to process the donated blood. RISKS AND COMPLICATIONS Although transfusion therapy is very safe and saves many lives, the main dangers of transfusion include:   Getting an infectious disease.  Developing a transfusion reaction. This is an allergic reaction to something in the blood you were given. Every precaution is taken to prevent this. The decision to have a blood transfusion has been  considered carefully by your caregiver before blood is given. Blood is not given unless the benefits outweigh the risks. AFTER THE TRANSFUSION  Right after receiving a blood transfusion, you will usually feel much better and more energetic. This is especially true if your red blood cells have gotten low (anemic). The transfusion raises the level of the red blood cells which carry oxygen, and this usually causes an energy increase.  The nurse administering the transfusion will monitor you carefully for complications. HOME CARE INSTRUCTIONS  No special instructions are needed after a transfusion. You may find your energy is better. Speak with your caregiver about any limitations on activity for underlying diseases you may have. SEEK MEDICAL CARE IF:   Your condition is not improving after your transfusion.  You develop redness or irritation at the intravenous (IV) site. SEEK IMMEDIATE MEDICAL CARE IF:  Any of the following symptoms occur over the next 12 hours:  Shaking chills.  You have a temperature by mouth above 102 F (38.9 C), not controlled by medicine.  Chest, back, or muscle pain.  People around you feel you are not acting correctly or are confused.  Shortness  of breath or difficulty breathing.  Dizziness and fainting.  You get a rash or develop hives.  You have a decrease in urine output.  Your urine turns a dark color or changes to pink, red, or brown. Any of the following symptoms occur over the next 10 days:  You have a temperature by mouth above 102 F (38.9 C), not controlled by medicine.  Shortness of breath.  Weakness after normal activity.  The white part of the eye turns yellow (jaundice).  You have a decrease in the amount of urine or are urinating less often.  Your urine turns a dark color or changes to pink, red, or brown. Document Released: 11/02/2000 Document Revised: 01/28/2012 Document Reviewed: 06/21/2008 [facility] Patient Information 2014  [place], [place].  _______________________________________________________________________

## 2016-12-28 ENCOUNTER — Ambulatory Visit: Payer: Managed Care, Other (non HMO) | Admitting: Endocrinology

## 2016-12-28 ENCOUNTER — Encounter (HOSPITAL_COMMUNITY)
Admission: RE | Admit: 2016-12-28 | Discharge: 2016-12-28 | Disposition: A | Discharge status: Home / Self Care | Payer: Managed Care, Other (non HMO) | Source: Ambulatory Visit | Attending: Urology | Admitting: Urology

## 2016-12-28 ENCOUNTER — Encounter (HOSPITAL_COMMUNITY): Payer: Self-pay

## 2016-12-28 DIAGNOSIS — Z01812 Encounter for preprocedural laboratory examination: Secondary | ICD-10-CM | POA: Diagnosis not present

## 2016-12-28 DIAGNOSIS — N400 Enlarged prostate without lower urinary tract symptoms: Secondary | ICD-10-CM | POA: Insufficient documentation

## 2016-12-28 DIAGNOSIS — Z01810 Encounter for preprocedural cardiovascular examination: Secondary | ICD-10-CM | POA: Insufficient documentation

## 2016-12-28 LAB — CBC
HCT: 42.2 % (ref 39.0–52.0)
Hemoglobin: 14.1 g/dL (ref 13.0–17.0)
MCH: 27 pg (ref 26.0–34.0)
MCHC: 33.4 g/dL (ref 30.0–36.0)
MCV: 80.7 fL (ref 78.0–100.0)
Platelets: 166 10*3/uL (ref 150–400)
RBC: 5.23 MIL/uL (ref 4.22–5.81)
RDW: 14.1 % (ref 11.5–15.5)
WBC: 4.8 10*3/uL (ref 4.0–10.5)

## 2016-12-28 LAB — BASIC METABOLIC PANEL
Anion gap: 10 (ref 5–15)
BUN: 21 mg/dL — ABNORMAL HIGH (ref 6–20)
CO2: 26 mmol/L (ref 22–32)
Calcium: 9.1 mg/dL (ref 8.9–10.3)
Chloride: 103 mmol/L (ref 101–111)
Creatinine, Ser: 1.06 mg/dL (ref 0.61–1.24)
GFR calc Af Amer: >60 mL/min (ref >60)
GFR calc non Af Amer: >60 mL/min (ref >60)
Glucose, Bld: 139 mg/dL — ABNORMAL HIGH (ref 65–99)
Potassium: 3.9 mmol/L (ref 3.5–5.1)
Sodium: 139 mmol/L (ref 135–145)

## 2016-12-28 LAB — GLUCOSE, CAPILLARY: Glucose-Capillary: 149 mg/dL — ABNORMAL HIGH (ref 65–99)

## 2016-12-28 NOTE — Progress Notes (Signed)
   12/28/16 0844  OBSTRUCTIVE SLEEP APNEA  Have you ever been diagnosed with sleep apnea through a sleep study? No  Do you snore loudly (loud enough to be heard through closed doors)?  1  Do you often feel tired, fatigued, or sleepy during the daytime (such as falling asleep during driving or talking to someone)? 1  Has anyone observed you stop breathing during your sleep? 0  Do you have, or are you being treated for high blood pressure? 1  BMI more than 35 kg/m2? 1  Age > 50 (1-yes) 1  Neck circumference greater than:Male 16 inches or larger, Male 17inches or larger? 1  Male Gender (Yes=1) 1  Obstructive Sleep Apnea Score 7  Score 5 or greater  Results sent to PCP

## 2016-12-28 NOTE — Progress Notes (Signed)
12/11/2016- Office note with clearance and EKG  From Dr. Wynonia Lawman on chart.

## 2016-12-29 LAB — HEMOGLOBIN A1C
Hgb A1c MFr Bld: 8 % — ABNORMAL HIGH (ref 4.8–5.6)
Mean Plasma Glucose: 183 mg/dL

## 2016-12-31 LAB — URINE CULTURE: Culture: 60000 — AB

## 2016-12-31 NOTE — Progress Notes (Signed)
Confirmed EKG from 12/28/2016 in chart along with past EKG from 05/18/2015 and 06/18/2013 which all read the same  History on EKG.

## 2017-01-04 ENCOUNTER — Ambulatory Visit (HOSPITAL_COMMUNITY): Payer: Managed Care, Other (non HMO) | Admitting: Anesthesiology

## 2017-01-04 ENCOUNTER — Encounter (HOSPITAL_COMMUNITY)
Admission: RE | Disposition: A | Discharge status: Home / Self Care | Payer: Self-pay | Source: Ambulatory Visit | Attending: Urology

## 2017-01-04 ENCOUNTER — Observation Stay (HOSPITAL_COMMUNITY)
Admission: RE | Admit: 2017-01-04 | Discharge: 2017-01-05 | Disposition: A | Discharge status: Home / Self Care | Payer: Managed Care, Other (non HMO) | Source: Ambulatory Visit | Attending: Urology | Admitting: Urology

## 2017-01-04 ENCOUNTER — Encounter (HOSPITAL_COMMUNITY): Payer: Self-pay | Admitting: *Deleted

## 2017-01-04 DIAGNOSIS — E119 Type 2 diabetes mellitus without complications: Secondary | ICD-10-CM | POA: Diagnosis not present

## 2017-01-04 DIAGNOSIS — N320 Bladder-neck obstruction: Secondary | ICD-10-CM | POA: Diagnosis not present

## 2017-01-04 DIAGNOSIS — I4510 Unspecified right bundle-branch block: Secondary | ICD-10-CM | POA: Diagnosis not present

## 2017-01-04 DIAGNOSIS — N138 Other obstructive and reflux uropathy: Secondary | ICD-10-CM

## 2017-01-04 DIAGNOSIS — I10 Essential (primary) hypertension: Secondary | ICD-10-CM | POA: Diagnosis not present

## 2017-01-04 DIAGNOSIS — Z794 Long term (current) use of insulin: Secondary | ICD-10-CM | POA: Insufficient documentation

## 2017-01-04 DIAGNOSIS — N401 Enlarged prostate with lower urinary tract symptoms: Principal | ICD-10-CM | POA: Insufficient documentation

## 2017-01-04 DIAGNOSIS — Z6835 Body mass index (BMI) 35.0-35.9, adult: Secondary | ICD-10-CM | POA: Diagnosis not present

## 2017-01-04 DIAGNOSIS — E669 Obesity, unspecified: Secondary | ICD-10-CM | POA: Insufficient documentation

## 2017-01-04 DIAGNOSIS — Z888 Allergy status to other drugs, medicaments and biological substances status: Secondary | ICD-10-CM | POA: Insufficient documentation

## 2017-01-04 DIAGNOSIS — R338 Other retention of urine: Secondary | ICD-10-CM | POA: Diagnosis not present

## 2017-01-04 DIAGNOSIS — Z7982 Long term (current) use of aspirin: Secondary | ICD-10-CM | POA: Insufficient documentation

## 2017-01-04 DIAGNOSIS — I2510 Atherosclerotic heart disease of native coronary artery without angina pectoris: Secondary | ICD-10-CM | POA: Diagnosis not present

## 2017-01-04 DIAGNOSIS — Z87891 Personal history of nicotine dependence: Secondary | ICD-10-CM | POA: Insufficient documentation

## 2017-01-04 DIAGNOSIS — G4730 Sleep apnea, unspecified: Secondary | ICD-10-CM | POA: Insufficient documentation

## 2017-01-04 DIAGNOSIS — M1990 Unspecified osteoarthritis, unspecified site: Secondary | ICD-10-CM | POA: Insufficient documentation

## 2017-01-04 DIAGNOSIS — R011 Cardiac murmur, unspecified: Secondary | ICD-10-CM | POA: Insufficient documentation

## 2017-01-04 DIAGNOSIS — N210 Calculus in bladder: Secondary | ICD-10-CM | POA: Diagnosis not present

## 2017-01-04 HISTORY — PX: CYSTOSCOPY WITH LITHOLAPAXY: SHX1425

## 2017-01-04 HISTORY — DX: Other obstructive and reflux uropathy: N40.1

## 2017-01-04 HISTORY — PX: TRANSURETHRAL RESECTION OF PROSTATE: SHX73

## 2017-01-04 HISTORY — DX: Other obstructive and reflux uropathy: N13.8

## 2017-01-04 LAB — GLUCOSE, CAPILLARY
Glucose-Capillary: 126 mg/dL — ABNORMAL HIGH (ref 65–99)
Glucose-Capillary: 123 mg/dL — ABNORMAL HIGH (ref 65–99)

## 2017-01-04 LAB — TYPE AND SCREEN
ABO/RH(D): A POS
Antibody Screen: NEGATIVE

## 2017-01-04 SURGERY — CYSTOSCOPY, WITH BLADDER CALCULUS LITHOLAPAXY
Anesthesia: General

## 2017-01-04 MED ORDER — IRBESARTAN 150 MG PO TABS
300.0000 mg | ORAL_TABLET | Freq: Every evening | ORAL | Status: DC
  Administered 2017-01-04: 300 mg via ORAL
  Filled 2017-01-04: qty 2

## 2017-01-04 MED ORDER — ROCURONIUM BROMIDE 50 MG/5ML IV SOSY
PREFILLED_SYRINGE | INTRAVENOUS | Status: AC
  Filled 2017-01-04: qty 10

## 2017-01-04 MED ORDER — ONDANSETRON HCL 4 MG/2ML IJ SOLN
INTRAMUSCULAR | Status: DC | PRN
Start: 2017-01-04 — End: 2017-01-04
  Administered 2017-01-04: 4 mg via INTRAVENOUS

## 2017-01-04 MED ORDER — MIDAZOLAM HCL 2 MG/2ML IJ SOLN
INTRAMUSCULAR | Status: AC
  Filled 2017-01-04: qty 2

## 2017-01-04 MED ORDER — TRAMADOL HCL 50 MG PO TABS
100.0000 mg | ORAL_TABLET | Freq: Four times a day (QID) | ORAL | Status: DC | PRN
Start: 2017-01-04 — End: 2017-01-05
  Administered 2017-01-04 – 2017-01-05 (×3): 100 mg via ORAL
  Filled 2017-01-04 (×3): qty 2

## 2017-01-04 MED ORDER — ONDANSETRON HCL 4 MG/2ML IJ SOLN
INTRAMUSCULAR | Status: AC
  Filled 2017-01-04: qty 2

## 2017-01-04 MED ORDER — PHENYLEPHRINE 40 MCG/ML (10ML) SYRINGE FOR IV PUSH (FOR BLOOD PRESSURE SUPPORT)
PREFILLED_SYRINGE | INTRAVENOUS | Status: AC
  Filled 2017-01-04: qty 10

## 2017-01-04 MED ORDER — METOPROLOL TARTRATE 50 MG PO TABS
50.0000 mg | ORAL_TABLET | Freq: Once | ORAL | Status: AC
  Administered 2017-01-04: 50 mg via ORAL
  Filled 2017-01-04: qty 1

## 2017-01-04 MED ORDER — ONDANSETRON HCL 4 MG PO TABS: 4.0000 mg | ORAL_TABLET | Freq: Three times a day (TID) | ORAL | Status: DC | PRN

## 2017-01-04 MED ORDER — PHENYLEPHRINE HCL 10 MG/ML IJ SOLN
INTRAMUSCULAR | Status: DC | PRN
  Administered 2017-01-04: 80 ug via INTRAVENOUS
  Administered 2017-01-04: 40 ug via INTRAVENOUS
  Administered 2017-01-04 (×2): 80 ug via INTRAVENOUS

## 2017-01-04 MED ORDER — DOCUSATE SODIUM 100 MG PO CAPS: 100.0000 mg | ORAL_CAPSULE | Freq: Two times a day (BID) | ORAL | 0 refills | Status: DC

## 2017-01-04 MED ORDER — CIPROFLOXACIN IN D5W 400 MG/200ML IV SOLN
400.0000 mg | INTRAVENOUS | Status: AC
  Administered 2017-01-04: 400 mg via INTRAVENOUS

## 2017-01-04 MED ORDER — METOPROLOL TARTRATE 50 MG PO TABS
50.0000 mg | ORAL_TABLET | Freq: Two times a day (BID) | ORAL | Status: DC
  Administered 2017-01-04 – 2017-01-05 (×2): 50 mg via ORAL
  Filled 2017-01-04 (×2): qty 1

## 2017-01-04 MED ORDER — LIDOCAINE HCL 2 % EX GEL
CUTANEOUS | Status: DC | PRN
  Administered 2017-01-04: 1 via URETHRAL

## 2017-01-04 MED ORDER — LIDOCAINE HCL 2 % EX GEL
CUTANEOUS | Status: AC
  Filled 2017-01-04: qty 5

## 2017-01-04 MED ORDER — MORPHINE SULFATE (PF) 4 MG/ML IV SOLN
2.0000 mg | INTRAVENOUS | Status: DC | PRN
  Administered 2017-01-04 – 2017-01-05 (×3): 2 mg via INTRAVENOUS
  Filled 2017-01-04 (×3): qty 1

## 2017-01-04 MED ORDER — STERILE WATER FOR IRRIGATION IR SOLN
Status: DC | PRN
Start: 2017-01-04 — End: 2017-01-04
  Administered 2017-01-04: 500 mL

## 2017-01-04 MED ORDER — EPHEDRINE 5 MG/ML INJ
INTRAVENOUS | Status: AC
  Filled 2017-01-04: qty 10

## 2017-01-04 MED ORDER — AMLODIPINE BESYLATE 5 MG PO TABS
5.0000 mg | ORAL_TABLET | Freq: Every day | ORAL | Status: DC
  Administered 2017-01-04 – 2017-01-05 (×2): 5 mg via ORAL
  Filled 2017-01-04 (×2): qty 1

## 2017-01-04 MED ORDER — BELLADONNA ALKALOIDS-OPIUM 16.2-60 MG RE SUPP
RECTAL | Status: AC
  Filled 2017-01-04: qty 1

## 2017-01-04 MED ORDER — DOXAZOSIN MESYLATE 4 MG PO TABS
8.0000 mg | ORAL_TABLET | Freq: Every day | ORAL | Status: DC
  Administered 2017-01-04: 8 mg via ORAL
  Filled 2017-01-04: qty 2

## 2017-01-04 MED ORDER — ACETAMINOPHEN 500 MG PO TABS
1000.0000 mg | ORAL_TABLET | Freq: Every day | ORAL | Status: DC
  Administered 2017-01-04 – 2017-01-05 (×2): 1000 mg via ORAL
  Filled 2017-01-04 (×2): qty 2

## 2017-01-04 MED ORDER — HYDRALAZINE HCL 20 MG/ML IJ SOLN: 5.0000 mg | INTRAMUSCULAR | Status: DC | PRN

## 2017-01-04 MED ORDER — ONDANSETRON HCL 4 MG/2ML IJ SOLN: 4.0000 mg | Freq: Once | INTRAMUSCULAR | Status: DC | PRN

## 2017-01-04 MED ORDER — TRAMADOL HCL 50 MG PO TABS: 100.0000 mg | ORAL_TABLET | Freq: Four times a day (QID) | ORAL | 0 refills | Status: DC | PRN

## 2017-01-04 MED ORDER — SUGAMMADEX SODIUM 500 MG/5ML IV SOLN
INTRAVENOUS | Status: DC | PRN
  Administered 2017-01-04: 220 mg via INTRAVENOUS

## 2017-01-04 MED ORDER — FENTANYL CITRATE (PF) 100 MCG/2ML IJ SOLN: 25.0000 ug | INTRAMUSCULAR | Status: DC | PRN

## 2017-01-04 MED ORDER — SODIUM CHLORIDE 0.45 % IV SOLN
INTRAVENOUS | Status: DC
  Administered 2017-01-04: 12:00:00 via INTRAVENOUS

## 2017-01-04 MED ORDER — SODIUM CHLORIDE 0.9 % IR SOLN
Status: DC | PRN
  Administered 2017-01-04: 21000 mL

## 2017-01-04 MED ORDER — MIDAZOLAM HCL 5 MG/5ML IJ SOLN
INTRAMUSCULAR | Status: DC | PRN
  Administered 2017-01-04: 2 mg via INTRAVENOUS

## 2017-01-04 MED ORDER — HYDROCHLOROTHIAZIDE 25 MG PO TABS
25.0000 mg | ORAL_TABLET | Freq: Every evening | ORAL | Status: DC
  Administered 2017-01-04: 25 mg via ORAL
  Filled 2017-01-04: qty 1

## 2017-01-04 MED ORDER — PROPOFOL 10 MG/ML IV BOLUS
INTRAVENOUS | Status: AC
  Filled 2017-01-04: qty 40

## 2017-01-04 MED ORDER — LIDOCAINE 2% (20 MG/ML) 5 ML SYRINGE
INTRAMUSCULAR | Status: AC
  Filled 2017-01-04: qty 5

## 2017-01-04 MED ORDER — PRAVASTATIN SODIUM 40 MG PO TABS
40.0000 mg | ORAL_TABLET | Freq: Every evening | ORAL | Status: DC
  Administered 2017-01-04: 40 mg via ORAL
  Filled 2017-01-04: qty 1

## 2017-01-04 MED ORDER — FENTANYL CITRATE (PF) 100 MCG/2ML IJ SOLN
INTRAMUSCULAR | Status: AC
  Filled 2017-01-04: qty 4

## 2017-01-04 MED ORDER — CIPROFLOXACIN IN D5W 400 MG/200ML IV SOLN
INTRAVENOUS | Status: AC
Start: 2017-01-04 — End: 2017-01-04
  Filled 2017-01-04: qty 200

## 2017-01-04 MED ORDER — VALSARTAN-HYDROCHLOROTHIAZIDE 320-25 MG PO TABS: 1.0000 | ORAL_TABLET | Freq: Every evening | ORAL | Status: DC

## 2017-01-04 MED ORDER — LIDOCAINE HCL (CARDIAC) 20 MG/ML IV SOLN
INTRAVENOUS | Status: DC | PRN
  Administered 2017-01-04: 50 mg via INTRAVENOUS

## 2017-01-04 MED ORDER — SODIUM CHLORIDE 0.9 % IR SOLN
Status: DC | PRN
  Administered 2017-01-04: 1000 mL

## 2017-01-04 MED ORDER — EPHEDRINE SULFATE 50 MG/ML IJ SOLN
INTRAMUSCULAR | Status: DC | PRN
  Administered 2017-01-04: 5 mg via INTRAVENOUS
  Administered 2017-01-04 (×2): 10 mg via INTRAVENOUS

## 2017-01-04 MED ORDER — DOCUSATE SODIUM 100 MG PO CAPS
100.0000 mg | ORAL_CAPSULE | Freq: Two times a day (BID) | ORAL | Status: DC
  Administered 2017-01-04 – 2017-01-05 (×3): 100 mg via ORAL
  Filled 2017-01-04 (×3): qty 1

## 2017-01-04 MED ORDER — PROPOFOL 10 MG/ML IV BOLUS
INTRAVENOUS | Status: DC | PRN
Start: 2017-01-04 — End: 2017-01-04
  Administered 2017-01-04: 170 mg via INTRAVENOUS
  Administered 2017-01-04: 30 mg via INTRAVENOUS

## 2017-01-04 MED ORDER — PHENYLEPHRINE HCL 10 MG/ML IJ SOLN
INTRAMUSCULAR | Status: AC
  Filled 2017-01-04: qty 1

## 2017-01-04 MED ORDER — ROCURONIUM BROMIDE 100 MG/10ML IV SOLN
INTRAVENOUS | Status: DC | PRN
  Administered 2017-01-04: 50 mg via INTRAVENOUS
  Administered 2017-01-04: 10 mg via INTRAVENOUS
  Administered 2017-01-04: 20 mg via INTRAVENOUS

## 2017-01-04 MED ORDER — BELLADONNA ALKALOIDS-OPIUM 16.2-60 MG RE SUPP
RECTAL | Status: DC | PRN
  Administered 2017-01-04: 1 via RECTAL

## 2017-01-04 MED ORDER — SUGAMMADEX SODIUM 500 MG/5ML IV SOLN
INTRAVENOUS | Status: AC
  Filled 2017-01-04: qty 5

## 2017-01-04 MED ORDER — CIPROFLOXACIN HCL 500 MG PO TABS
500.0000 mg | ORAL_TABLET | Freq: Once | ORAL | Status: AC
  Administered 2017-01-04: 500 mg via ORAL
  Filled 2017-01-04: qty 1

## 2017-01-04 MED ORDER — FENTANYL CITRATE (PF) 100 MCG/2ML IJ SOLN
INTRAMUSCULAR | Status: DC | PRN
  Administered 2017-01-04: 50 ug via INTRAVENOUS
  Administered 2017-01-04: 100 ug via INTRAVENOUS
  Administered 2017-01-04: 50 ug via INTRAVENOUS

## 2017-01-04 MED ORDER — LACTATED RINGERS IV SOLN
INTRAVENOUS | Status: DC | PRN
  Administered 2017-01-04 (×2): via INTRAVENOUS

## 2017-01-04 SURGICAL SUPPLY — 27 items
BAG URINE DRAINAGE (UROLOGICAL SUPPLIES) ×3 IMPLANT
BAG URO CATCHER STRL LF (MISCELLANEOUS) ×3 IMPLANT
CATH FOLEY 2WAY SLVR 30CC 24FR (CATHETERS) ×1 IMPLANT
CATH FOLEY 3WAY 30CC 22FR (CATHETERS) ×2 IMPLANT
CATH FOLEY 3WAY 30CC 24FR (CATHETERS)
CATH URTH STD 24FR FL 3W 2 (CATHETERS) IMPLANT
CLOTH BEACON ORANGE TIMEOUT ST (SAFETY) ×3 IMPLANT
ELECT REM PT RETURN 9FT ADLT (ELECTROSURGICAL) ×3
ELECTRODE REM PT RTRN 9FT ADLT (ELECTROSURGICAL) ×1 IMPLANT
EVACUATOR ELLICK (MISCELLANEOUS) ×1 IMPLANT
EVACUATOR MICROVAS BLADDER (UROLOGICAL SUPPLIES) ×1 IMPLANT
FIBER LASER FLEXIVA 1000 (UROLOGICAL SUPPLIES) ×2 IMPLANT
GLOVE BIO SURGEON STRL SZ7.5 (GLOVE) ×3 IMPLANT
GLOVE BIOGEL M STRL SZ7.5 (GLOVE) ×3 IMPLANT
GOWN STRL REUS W/ TWL XL LVL3 (GOWN DISPOSABLE) ×1 IMPLANT
GOWN STRL REUS W/TWL XL LVL3 (GOWN DISPOSABLE) ×6 IMPLANT
HOLDER FOLEY CATH W/STRAP (MISCELLANEOUS) IMPLANT
LOOP CUT BIPOLAR 24F LRG (ELECTROSURGICAL) IMPLANT
MANIFOLD NEPTUNE II (INSTRUMENTS) ×3 IMPLANT
NS IRRIG 1000ML POUR BTL (IV SOLUTION) ×2 IMPLANT
PACK CYSTO (CUSTOM PROCEDURE TRAY) ×3 IMPLANT
SET ASPIRATION TUBING (TUBING) ×1 IMPLANT
SYR 30ML LL (SYRINGE) IMPLANT
SYRINGE IRR TOOMEY STRL 70CC (SYRINGE) ×1 IMPLANT
TUBING CONNECTING 10 (TUBING) ×2 IMPLANT
TUBING CONNECTING 10' (TUBING) ×1
WATER STERILE IRR 500ML POUR (IV SOLUTION) ×2 IMPLANT

## 2017-01-04 NOTE — H&P (Signed)
Non-obstructing stone follow-up HPI: Matthew Schmitt is a 66 year-old male established patient who is here today for interval evaluation of non-obstructing kidney stones.  The patient was last seen December 2017.   The patient has not passed any stones since they were last seen. He has not had any flank pain since in the interval. The patient had Right-side as well as 3 mediums sized bladder stones.   The patient has developed frequency and intermittency. He does have dysuria. He does not have hematuria. He has had fever and chills.   He has had kidney stone surgery. He underwent pcnl.   Stone composition: calcium oxalate and uric acid. The patient has not completed a 24 hour urine collection in the past. The patient is not taking any medications for stone prevention.   The patient underwent No imaging prior to today's appointment.   Patient has 3 medium sized bladder stones. Cystoscopy demonstrated an obstructing mediam lobe. He does have progressive voiding symptoms and notes worsening when he misses his Tamsulosin. He has had numerous stones/stone surgery in the past for both bladder stones, ureteral stones and partial stag-horn stones.   Intv: Patient follows up today to discuss a management strategy for his bladder stones. He still complains of urgency, frequency, and occasional dysuria. He is still on Tamsulosin and states is voiding symptoms are much worse if he were to miss a dose. He does have some mild, intermittent pain. He denies any gross hematuria. He states that he had some issues with recurrent episodes of cystitis this year, for which he has had to have antibiotic treatment for. He does have known right renal stones. He denies any current flank pain. He has never had a 24 hour urine or metabolic evaluation that he is aware of. His most recent PSA was done by his PCP in June 2017 and was 1.6.    ALLERGIES: No Allergies   MEDICATIONS: Adult Aspirin Low Strength 81 MG CHEW Oral   AmLODIPine Besylate 5 MG Oral Tablet Oral  Doxazosin Mesylate 8 MG Oral Tablet Oral  Lantus Solostar 100 unit/ml (3 ml) insulin pen  Metoprolol Tartrate 50 MG Oral Tablet Oral  Naproxen Sodium 220 mg capsule  Pravastatin Sodium 40 MG Oral Tablet Oral  Tamsulosin HCl - 0.4 MG Oral Capsule 1 tablet PO Daily  Tylenol  Valsartan-Hydrochlorothiazide 320 mg-25 mg tablet    GU PSH: Cysto Bladder Stone <2.5cm - 05/19/2015 Cysto Uretero Lithotripsy - 2013 Cystoscopy - 11/02/2016 Cystoscopy Insert Stent - 2013 Lap Partial Nephrectomy - 2014 Percut Stone Removal >2cm - 2015     PSH Notes: Knee Surgery  NON-GU PSH: Back Surgery (Unspecified) - 2009 Remove Tonsils - 2009 Shoulder Arthroscopy/surgery, Left - 08/03/2016      GU PMH: Urinary Urgency - 11/02/2016 Weak Urinary Stream - 11/02/2016 Flank Pain (Acute), Right, Ketorolac 30 mg IM PRN NSAID - 08/08/2016 Acute Cystitis - 05/11/2016 Kidney Stone - 05/11/2016, Nephrolithiasis, - 10/21/2015 Bladder Stone, Bladder calculus - 10/21/2015 Urinary Tract Inf, Unspec site, Pyuria - 10/21/2015 BPH w/LUTS, Benign prostatic hyperplasia with urinary obstruction - 06/27/2015 Urinary Frequency, Increased urinary frequency - 06/27/2015 Abdominal Pain Unspec, Left flank pain - 06/01/2015 Benign Neo Right Kidney, Renal oncocytoma, right - 09/14/2014 Benign Neo Kidney, Unspec, Renal oncocytoma, unspecified laterality - 09/07/2014 Gross hematuria, Gross hematuria - 2015 Calculus Ureter, Calculus of ureter - 2014 ED, arterial insufficiency, Erectile dysfunction due to arterial insufficiency - 2014     PMH Notes:  2008-01-23 15:52:01 - Note: Arthritis  NON-GU  PMH: Encounter for general adult medical examination without abnormal findings, Encounter for preventive health examination - 10/21/2015 Personal history of other diseases of the circulatory system, History of hypertension - 2014 Personal history of other endocrine, nutritional and metabolic disease, History  of diabetes mellitus - 2014 Unspecified atrial fibrillation, Atrial Fibrillation - 2014   FAMILY HISTORY: Diabetes - Runs In Family Hypertension - Runs In Family Kidney Cancer - No Family History  SOCIAL HISTORY: Marital Status: Married Current Smoking Status: Patient does not smoke anymore. Has not smoked since 04/19/1996.  Does drink.  Drinks 1 caffeinated drink per day.   REVIEW OF SYSTEMS:    GU Review Male:  Patient reports frequent urination, hard to postpone urination, get up at night to urinate, and erection problems. Patient denies burning/ pain with urination, leakage of urine, stream starts and stops, trouble starting your stream, have to strain to urinate , and penile pain.   Gastrointestinal (Upper):  Patient denies nausea, vomiting, and indigestion/ heartburn.   Gastrointestinal (Lower):  Patient denies diarrhea and constipation.   Constitutional:  Patient reports fever. Patient denies night sweats, weight loss, and fatigue.   Skin:  Patient denies skin rash/ lesion and itching.   Eyes:  Patient denies blurred vision and double vision.   Ears/ Nose/ Throat:  Patient denies sore throat and sinus problems.   Hematologic/Lymphatic:  Patient denies swollen glands and easy bruising.   Cardiovascular:  Patient reports leg swelling. Patient denies chest pains.   Respiratory:  Patient reports cough. Patient denies shortness of breath.   Endocrine:  Patient denies excessive thirst.   Musculoskeletal:  Patient reports joint pain. Patient denies back pain.   Neurological:  Patient denies headaches and dizziness.   Psychologic:  Patient denies depression and anxiety.   VITAL SIGNS:      12/03/2016 08:07 AM    Weight 256 lb / 116.12 kg    Height 71 in / 180.34 cm    BP 157/73 mmHg    Pulse 85 /min    BMI 35.7 kg/m    MULTI-SYSTEM PHYSICAL EXAMINATION:     Constitutional: Well-nourished. No physical deformities. Normally developed. Good grooming.    Neck: Neck symmetrical, not  swollen. Normal tracheal position.    Respiratory: No labored breathing, no use of accessory muscles. CTA bilaterally.    Cardiovascular: Heart murmur. Normal temperature, no swelling, no varicosities. RRR    Skin: No paleness, no jaundice, no cyanosis. No lesion, no ulcer, no rash.    Neurologic / Psychiatric: Oriented to time, oriented to place, oriented to person. No depression, no anxiety, no agitation.    Musculoskeletal: Normal gait and station of head and neck.          PAST DATA REVIEWED:  Source Of History:  Patient Records Review:  Previous Patient Records Urine Test Review:  Urinalysis  PROCEDURES:   Urinalysis  Dipstick Dipstick Cont'd Color: Yellow Bilirubin: Neg Appearance: Clear Ketones: Neg Specific Gravity: 1.025 Blood: Neg pH: <=5.0 Protein: Neg Glucose: Neg Urobilinogen: 0.2  Nitrites: Neg  Leukocyte Esterase: Neg   ASSESSMENT:    ICD-10 Details 1 GU:  Kidney Stone - N20.0 Nonobstructive, and asymptomatic currently. 2  Bladder Stone - N21.0 The patient is symptomatic with his bladder symptoms including urgency and dysuria. He is eager to treat these. 3  BPH w/LUTS - N40.1 Patient has a long history of voiding symptoms and has not responded well to medication.  PLAN:  Document  Letter(s):  Created for Patient: Clinical Summary  Notes:  I spoke to the patient in regards to his treatment options for his bladder stones and obstructing prostate. Ultimately, we have chosen to proceed with cystoscopy litholapaxy and TUR T. I explained the surgery to the patient in quite a bit of detail including the risks and the benefits. The patient does have a heart murmur and recently poorly controlled diabetes and as such I have requested that we get clearance from his cardiologist. Once we have obtained clearance, we will proceed with the procedure.

## 2017-01-04 NOTE — Transfer of Care (Signed)
Immediate Anesthesia Transfer of Care Note  Patient: Matthew Schmitt  Procedure(s) Performed: Procedure(s): CYSTOSCOPY WITH LITHOLAPAXY, Holmium Laser (N/A) TRANSURETHRAL RESECTION OF THE PROSTATE (TURP) (N/A)  Patient Location: PACU  Anesthesia Type:General  Level of Consciousness: awake, alert , oriented and patient cooperative  Airway & Oxygen Therapy: Patient Spontanous Breathing and Patient connected to face mask oxygen  Post-op Assessment: Report given to RN, Post -op Vital signs reviewed and stable and Patient moving all extremities X 4  Post vital signs: Reviewed and stable  Last Vitals:  Vitals:   01/04/17 0550  BP: (!) 141/76  Pulse: 73  Resp: 16  Temp: 36.9 C    Last Pain:  Vitals:   01/04/17 0550  TempSrc: Oral      Patients Stated Pain Goal: 3 ([id number])  Complications: No apparent anesthesia complications

## 2017-01-04 NOTE — Anesthesia Postprocedure Evaluation (Signed)
Anesthesia Post Note  Patient: TESFA OBRIANT  Procedure(s) Performed: Procedure(s) (LRB): CYSTOSCOPY WITH LITHOLAPAXY, Holmium Laser (N/A) TRANSURETHRAL RESECTION OF THE PROSTATE (TURP) (N/A)  Patient location during evaluation: PACU Anesthesia Type: General Level of consciousness: awake and alert Pain management: pain level controlled Vital Signs Assessment: post-procedure vital signs reviewed and stable Respiratory status: spontaneous breathing, nonlabored ventilation, respiratory function stable and patient connected to nasal cannula oxygen Cardiovascular status: blood pressure returned to baseline and stable Postop Assessment: no signs of nausea or vomiting Anesthetic complications: no       Last Vitals:  Vitals:   01/04/17 1031 01/04/17 1042  BP: 134/74 135/79  Pulse: 75 79  Resp: 13 13  Temp: 36.4 C 36.6 C    Last Pain:  Vitals:   01/04/17 1232  TempSrc:   PainSc: [place]

## 2017-01-04 NOTE — Transfer of Care (Signed)
Immediate Anesthesia Transfer of Care Note  Patient: Matthew Schmitt  Procedure(s) Performed: Procedure(s): CYSTOSCOPY WITH LITHOLAPAXY, Holmium Laser (N/A) TRANSURETHRAL RESECTION OF THE PROSTATE (TURP) (N/A)  Patient Location: PACU  Anesthesia Type:General  Level of Consciousness: awake, oriented and patient cooperative  Airway & Oxygen Therapy: Patient Spontanous Breathing and Patient connected to face mask oxygen  Post-op Assessment: Report given to RN and Post -op Vital signs reviewed and stable  Post vital signs: Reviewed and stable  Last Vitals:  Vitals:   01/04/17 0550  BP: (!) 141/76  Pulse: 73  Resp: 16  Temp: 36.9 C    Last Pain:  Vitals:   01/04/17 0550  TempSrc: Oral      Patients Stated Pain Goal: 3 ([id number])  Complications: No apparent anesthesia complications

## 2017-01-04 NOTE — OR Nursing (Signed)
Bladder Stones sent with Dr. Louis Meckel.

## 2017-01-04 NOTE — Anesthesia Procedure Notes (Signed)
Procedure Name: Intubation Date/Time: 01/04/2017 7:57 AM Performed by: Sherian Maroon A Pre-anesthesia Checklist: Patient identified, Emergency Drugs available, Suction available, Patient being monitored and Timeout performed Patient Re-evaluated:Patient Re-evaluated prior to inductionOxygen Delivery Method: Circle system utilized Preoxygenation: Pre-oxygenation with 100% oxygen Intubation Type: IV induction Ventilation: Two handed mask ventilation required Laryngoscope Size: Glidescope and 4 Grade View: Grade III Tube size: 7.5 mm Number of attempts: 2 Airway Equipment and Method: Video-laryngoscopy

## 2017-01-04 NOTE — Anesthesia Procedure Notes (Signed)
Performed by: Sherian Maroon A Tube size: 8.0 mm

## 2017-01-04 NOTE — Discharge Instructions (Signed)
Transurethral Resection of the Prostate (TURP) or Greenlight laser ablation of the Prostate ° °Care After ° °Refer to this sheet in the next few weeks. These discharge instructions provide you with general information on caring for yourself after you leave the hospital. Your caregiver may also give you specific instructions. Your treatment has been planned according to the most current medical practices available, but unavoidable complications sometimes occur. If you have any problems or questions after discharge, please call your caregiver. ° °HOME CARE INSTRUCTIONS  ° °Medications °· You may receive medicine for pain management. As your level of discomfort decreases, adjustments in your pain medicines may be made.  °· Take all medicines as directed.  °· You may be given a medicine (antibiotic) to kill germs following surgery. Finish all medicines. Let your caregiver know if you have any side effects or problems from the medicine.  °· If you are on aspirin, it would be best not to restart the aspirin until the blood in the urine clears °Hygiene °· You can take a shower after surgery.  °· You should not take a bath while you still have the urethral catheter. °Activity °· You will be encouraged to get out of bed as much as possible and increase your activity level as tolerated.  °· Spend the first week in and around your home. For 3 weeks, avoid the following:  °· Straining.  °· Running.  °· Strenuous work.  °· Walks longer than a few blocks.  °· Riding for extended periods.  °· Sexual relations.  °· Do not lift heavy objects (more than 20 pounds) for at least 1 month. When lifting, use your arms instead of your abdominal muscles.  °· You will be encouraged to walk as tolerated. Do not exert yourself. Increase your activity level slowly. Remember that it is important to keep moving after an operation of any type. This cuts down on the possibility of developing blood clots.  °· Your caregiver will tell you when you  can resume driving and light housework. Discuss this at your first office visit after discharge. °Diet °· No special diet is ordered after a TURP. However, if you are on a special diet for another medical problem, it should be continued.  °· Normal fluid intake is usually recommended.  °· Avoid alcohol and caffeinated drinks for 2 weeks. They irritate the bladder. Decaffeinated drinks are okay.  °· Avoid spicy foods.  °Bladder Function °· For the first 10 days, empty the bladder whenever you feel a definite desire. Do not try to hold the urine for long periods of time.  °· Urinating once or twice a night even after you are healed is not uncommon.  °· You may see some recurrence of blood in the urine after discharge from the hospital. This usually happens within 2 weeks after the procedure.If this occurs, force fluids again as you did in the hospital and reduce your activity.  °Bowel Function °· You may experience some constipation after surgery. This can be minimized by increasing fluids and fiber in your diet. Drink enough water and fluids to keep your urine clear or pale yellow.  °· A stool softener may be prescribed for use at home. Do not strain to move your bowels.  °· If you are requiring increased pain medicine, it is important that you take stool softeners to prevent constipation. This will help to promote proper healing by reducing the need to strain to move your bowels.  °Sexual Activity °· Semen movement   after intercourse. Or, you may not have an ejaculation during erection. Ask your caregiver when you can resume sexual activity. Retrograde ejaculation and reduced semen discharge should not reduce one's pleasure of intercourse.  Postoperative Visit Arrange the date and time of your after surgery visit with your caregiver.  Return to Work After your recovery is complete, you will  be able to return to work and resume all activities. Your caregiver will inform you when you can return to work.  

## 2017-01-04 NOTE — Op Note (Signed)
Preoperative diagnosis:  1. Bladder outlet obstruction 2. Bladder stones  Postoperative diagnosis:  1. same   Procedure:  1. Transurethral resection of prostate 2. cystolithalopaxy  Surgeon: Ardis Hughs, MD   Anesthesia: General   Complications: None   Intraoperative findings:  #1 Cystoscopy demonstrated a normal appearing bladder mucosa .  UOs were orthopic.  Prostate demonstrated a large median lobe with lateral lobe obstruction. #2 4 median sized bladder stones, three ~1.5 cm in size each, the fourth stone was  0.8cm EBL: 100cc   Specimens: Prostate chips  Indication: Matthew Schmitt is a 66 y.o. patient with bladder outlet obstruction/urinary retention/obstructive voiding symptoms. He was found to have a large obstructing median lobe and 4 large bladder stones. After reviewing the management options for treatment, he elected to proceed with the above surgical procedure(s). We have discussed the potential benefits and risks of the procedure, side effects of the proposed treatment, the likelihood of the patient achieving the goals of the procedure, and any potential problems that might occur during the procedure or recuperation. Informed consent has been obtained.  Description of procedure:  The patient was taken to the operating room and general anesthesia was induced. The patient was placed in the dorsal lithotomy position, prepped and draped in the usual sterile fashion, and preoperative antibiotics were administered. A preoperative time-out was performed.   I then gently passed the 21 French 30 cystoscope into the patient's urethra and up into the bladder under visual guidance. A 360 cystoscopic evaluation was then performed with the above findings.  I then used a 1000  laser fiber with settings of 1.0 joules and 20 Hz fragmented the stones into several smaller pieces. Once the stones were all fragmented into passable stones I was able to get these out through the  cystoscope sheath.  I then removed the 21 French cystoscopic sheath and replacement with a 26 French resectoscope sheath was passed and using the visual obturator under direct vision. An exchange the obturator for the resectoscope itself and the loop cautery. I then proceeded to create grooves within the prostate at the 7:00 position extending the defect from the bladder neck at 7:00 down to the prostate apex just lateral to the verumontanum. I took this groove down to the capsule. I then performed a similar maneuver on the patient's left side at the 5:00 position taking the prostate down from the bladder neck to the apex and down the prostatic capsule. At this point I proceeded to resect the patient's right lateral lobe in a systematic fashion moving from the 7:00 position up to approximately 11. The resection was taken down to the prostate capsule. Hemostasis was achieved on this side prior to moving to the patient's left lateral lobe. A similar sequence was performed taking the prostate down from the 5:00 position to approximately the 1:00 position to the level of the prostatic capsule. I then completed the resection of the posterior wall as well as the area between 5:00 and 7:00 along the bladder neck. I was careful not to undermine the bladder neck or resect the inferior. I then did resect the area that was protruding into the prostatic urethra anteriorly.   Once I was satisfied that the prostate had been adequately resected I evacuated the prostate chips using a [place] syringe. I then reintroduced the resectoscope and ensured adequate hemostasis. I then left the bladder full and removed the resectoscope entirely. Exam under anesthesia demonstrated a normal sized prostate with no nodules. A 22  [place] three-way Foley catheter was then placed, this was Due to the lack of bleeding.  A B&O suppository was placed in the patient's rectum appears into the case.  The patient was subsequently extubated and  returned to the PACU in stable condition.  Ardis Hughs, MD

## 2017-01-04 NOTE — Anesthesia Preprocedure Evaluation (Addendum)
Anesthesia Evaluation  Patient identified by MRN, date of birth, ID band Patient awake    Reviewed: Allergy & Precautions, H&P , NPO status , Patient's Chart, lab work & pertinent test results, reviewed documented beta blocker date and time   History of Anesthesia Complications Negative for: history of anesthetic complications  Airway Mallampati: II  TM Distance: <3 FB Neck ROM: Full    Dental no notable dental hx. (+) Dental Advisory Given, Teeth Intact   Pulmonary sleep apnea , former smoker,    Pulmonary exam normal breath sounds clear to auscultation       Cardiovascular hypertension, Pt. on medications and Pt. on home beta blockers + angina with exertion + CAD  + dysrhythmias (RBBB) Atrial Fibrillation + Valvular Problems/Murmurs (moderate AI) AI  Rhythm:Regular Rate:Normal + Systolic murmurs and + Diastolic murmurs Bifascicular block   Neuro/Psych negative neurological ROS  negative psych ROS   GI/Hepatic negative GI ROS, Neg liver ROS,   Endo/Other  diabetes, Well Controlled, Type 2, Insulin DependentObesity   Renal/GU negative Renal ROS  negative genitourinary   Musculoskeletal  (+) Arthritis , Osteoarthritis,    Abdominal   Peds negative pediatric ROS (+)  Hematology negative hematology ROS (+)   Anesthesia Other Findings   Reproductive/Obstetrics negative OB ROS                           Anesthesia Physical  Anesthesia Plan  ASA: III  Anesthesia Plan: General   Post-op Pain Management:    Induction: Intravenous  Airway Management Planned: LMA  Additional Equipment:   Intra-op Plan:   Post-operative Plan: Extubation in OR  Informed Consent: I have reviewed the patients History and Physical, chart, labs and discussed the procedure including the risks, benefits and alternatives for the proposed anesthesia with the patient or authorized representative who has indicated  his/her understanding and acceptance.   Dental advisory given  Plan Discussed with: Surgeon  Anesthesia Plan Comments: (Risks/benefits of general anesthesia discussed with patient including risk of damage to teeth, lips, gum, and tongue, nausea/vomiting, allergic reactions to medications, and the possibility of heart attack, stroke and death.  All patient questions answered.  Patient wishes to proceed.)        Anesthesia Quick Evaluation

## 2017-01-05 DIAGNOSIS — N401 Enlarged prostate with lower urinary tract symptoms: Secondary | ICD-10-CM | POA: Diagnosis not present

## 2017-01-05 LAB — BASIC METABOLIC PANEL
Anion gap: 7 (ref 5–15)
BUN: 17 mg/dL (ref 6–20)
CO2: 31 mmol/L (ref 22–32)
Calcium: 8.8 mg/dL — ABNORMAL LOW (ref 8.9–10.3)
Chloride: 102 mmol/L (ref 101–111)
Creatinine, Ser: 0.95 mg/dL (ref 0.61–1.24)
GFR calc Af Amer: >60 mL/min (ref >60)
GFR calc non Af Amer: >60 mL/min (ref >60)
Glucose, Bld: 166 mg/dL — ABNORMAL HIGH (ref 65–99)
Potassium: 3.5 mmol/L (ref 3.5–5.1)
Sodium: 140 mmol/L (ref 135–145)

## 2017-01-05 LAB — CBC
HCT: 39.9 % (ref 39.0–52.0)
Hemoglobin: 13.1 g/dL (ref 13.0–17.0)
MCH: 26.6 pg (ref 26.0–34.0)
MCHC: 32.8 g/dL (ref 30.0–36.0)
MCV: 81.1 fL (ref 78.0–100.0)
Platelets: 120 10*3/uL — ABNORMAL LOW (ref 150–400)
RBC: 4.92 MIL/uL (ref 4.22–5.81)
RDW: 14.1 % (ref 11.5–15.5)
WBC: 7.5 10*3/uL (ref 4.0–10.5)

## 2017-01-05 NOTE — Discharge Summary (Signed)
Physician Discharge Summary  Patient ID: Matthew Schmitt MRN: [id number] DOB/AGE: [birth year] 66 y.o.  Admit date: 01/04/2017 Discharge date: 01/05/2017  Admission Diagnoses: [place] HYPERPLASIA  Discharge Diagnoses:  Active Problems:   BPH with obstruction/lower urinary tract symptoms   Discharged Condition: good  Hospital Course: He was electively brought to the operating room yesterday for cystolitholapaxy due to bladder stone and TURP to address his outlet. This proceeded without difficulty. Overnight he did well. He remained on CBI which remained clear. He has no complaints this morning and is ready for discharge. His catheter will be removed for a voiding trial and he will be discharged home today.  Significant Diagnostic Studies: No results found.  Discharge Exam: Blood pressure 123/70, pulse 61, temperature 98.1 F (36.7 C), temperature source Oral, resp. rate 18, height 5\' 11"  (1.803 m), weight 257 lb 12.8 oz (116.9 kg), SpO2 95 %. Awake, alert and no apparent distress. Abdomen is soft and nontender without mass and positive bowel sounds noted. Foley catheter in place and draining clear urine with no clots  Disposition: 01-Home or Self Care  Discharge Instructions    Discharge    Complete by:  As directed      Allergies as of 01/05/2017      Reactions   Actos [pioglitazone] Swelling   edema      Medication List    TAKE these medications   acetaminophen 500 MG tablet Commonly known as:  TYLENOL Take 1,000 mg by mouth daily.   amLODipine 5 MG tablet Commonly known as:  NORVASC Take 5 mg by mouth daily. Patient takes in am   aspirin 81 MG tablet Take 81 mg by mouth daily.   BASAGLAR KWIKPEN 100 UNIT/ML Sopn Inject 2.4 mLs (240 Units total) into the skin every morning. And pen needles 3/day What changed:  when to take this  reasons to take this  additional instructions   docusate sodium 100 MG capsule Commonly known as:   COLACE Take 1 capsule (100 mg total) by mouth 2 (two) times daily.   doxazosin 8 MG tablet Commonly known as:  CARDURA Take 8 mg by mouth at bedtime.   metoprolol 50 MG tablet Commonly known as:  LOPRESSOR Take 50 mg by mouth 2 (two) times daily.   ONE TOUCH ULTRA TEST test strip Generic drug:  glucose blood USE 1 EACH 2 TIMES DAILY                              .   potassium citrate 10 MEQ (1080 MG) SR tablet Commonly known as:  UROCIT-K Take 2 tablets (20 mEq total) by mouth 2 (two) times daily. What changed:  when to take this   pravastatin 40 MG tablet Commonly known as:  PRAVACHOL Take 40 mg by mouth every evening.   traMADol 50 MG tablet Commonly known as:  ULTRAM Take 2 tablets (100 mg total) by mouth every 6 (six) hours as needed for moderate pain.   valsartan-hydrochlorothiazide 320-25 MG tablet Commonly known as:  DIOVAN-HCT Take 1 tablet by mouth every evening.      Follow-up Information    Ardis Hughs, MD Follow up on 02/14/2017.   Specialty:  Urology Why:  3:15pm Contact information: [place] [address] [phone]           Signed: Claybon Jabs 01/05/2017, 6:48 AM

## 2017-01-17 DIAGNOSIS — Z85528 Personal history of other malignant neoplasm of kidney: Secondary | ICD-10-CM

## 2017-01-17 HISTORY — DX: Personal history of other malignant neoplasm of kidney: Z85.528

## 2017-01-25 ENCOUNTER — Ambulatory Visit (INDEPENDENT_AMBULATORY_CARE_PROVIDER_SITE_OTHER): Payer: Managed Care, Other (non HMO) | Admitting: Endocrinology

## 2017-01-25 ENCOUNTER — Encounter: Payer: Self-pay | Admitting: Endocrinology

## 2017-01-25 VITALS — BP 132/84 | HR 62 | Ht 71.0 in | Wt 261.0 lb

## 2017-01-25 DIAGNOSIS — E1021 Type 1 diabetes mellitus with diabetic nephropathy: Secondary | ICD-10-CM

## 2017-01-25 LAB — POCT GLYCOSYLATED HEMOGLOBIN (HGB A1C): Hemoglobin A1C: 7.5

## 2017-01-25 NOTE — Patient Instructions (Addendum)
check your blood sugar once a day.  vary the time of day when you check, between before the 3 meals, and at bedtime.  also check if you have symptoms of your blood sugar being too high or too low.  please keep a record of the readings and bring it to your next appointment here.  please call us sooner if you are having low blood sugar episodes, or if it is running above 200.  On this type of insulin schedule, you should eat meals on a regular schedule.  If a meal is missed or significantly delayed, your blood sugar could go low.   Please continue the same insulin.  Please come back for a follow-up appointment in 4 months.

## 2017-01-25 NOTE — Progress Notes (Signed)
Subjective:    Patient ID: Matthew Schmitt, male    DOB: Mar 24, 1951, 66 y.o.   MRN: [id number]  HPI Pt returns for f/u of diabetes mellitus: DM type: Insulin-requiring type 2. Dx'ed: 2002.   Complications: nephropathy, leg ulcers, and CAD.  Therapy: insulin since 2015.   DKA: never.   Severe hypoglycemia: once, in 2017 Pancreatitis: never.   Other: he declines multiple daily injections; he has severe insulin resistance.  In early 2017, he requested to change levemir to lantus, due to high dosage requirement.  Interval history: no cbg record, but states cbg's vary from 88-280.  It is in general higher as the day goes on.  Past Medical History:  Diagnosis Date  . Arthritis    everywhere  . Bladder calculus   . Cancer ([place]) 2014   Melanoma on right leg   . Coronary artery disease    cardiologist-  dr Wynonia Lawman  . Essential hypertension, benign   . First degree heart block   . Heart murmur   . History of kidney stones   . History of melanoma excision    June 2016  --- s/p  excision bilateral leg   . History of renal cell carcinoma    s/p  left partial nephrectomy  . Impotence of organic origin   . Lower urinary tract symptoms (LUTS)   . Mixed hyperlipidemia    slightly  . Multiple renal cysts    bilateral  . PAF (paroxysmal atrial fibrillation) ([place])   . Presence of surgical incision    x2  bilateral leg and right thigh  . RBBB   . Type 2 diabetes mellitus (HCC)    Type 2  . Wears glasses     Past Surgical History:  Procedure Laterality Date  . CARDIAC CATHETERIZATION  08-17-2011  DR SPENCER TILLEY   POSSIBLE MODERATE LAD STENOSIS JUST AFTER THE BIFURCATION OF LARGE DIAGONAL BRANCH/ LVEF  40-45%/ MILD GLOBAL HYPODINESIS (CATH DONE FOR ABNORMAL STRESS TEST OF FIXED LATERAL WALL DEFECT & BIFASCICULAR BLAOCK)  . CARDIOVERSION N/A 04/02/2013   Procedure: CARDIOVERSION;  Surgeon: Jacolyn Reedy, MD;  Location: [facility];  Service: Cardiovascular;  Laterality: N/A;  .  CYSTOSCOPY WITH LITHOLAPAXY N/A 05/18/2015   Procedure: CYSTOSCOPY WITH LITHOLAPAXY;  Surgeon: Rana Snare, MD;  Location: [facility];  Service: Urology;  Laterality: N/A;  . CYSTOSCOPY WITH LITHOLAPAXY N/A 01/04/2017   Procedure: CYSTOSCOPY WITH LITHOLAPAXY, Holmium Laser;  Surgeon: Ardis Hughs, MD;  Location: [facility];  Service: Urology;  Laterality: N/A;  . CYSTOSCOPY WITH RETROGRADE PYELOGRAM, URETEROSCOPY AND STENT PLACEMENT  11/03/2012   Procedure: CYSTOSCOPY WITH RETROGRADE PYELOGRAM, URETEROSCOPY AND STENT PLACEMENT;  Surgeon: Bernestine Amass, MD;  Location: [facility];  Service: Urology;  Laterality: Left;  Cystoscopy/Left Retrograde/Ureteroscopy/Holmium Laser Litho/Double J Stent  . HOLMIUM LASER APPLICATION  37/90/2409   Procedure: HOLMIUM LASER APPLICATION;  Surgeon: Bernestine Amass, MD;  Location: [facility];  Service: Urology;  Laterality: Left;  . HOLMIUM LASER APPLICATION N/A 7/35/3299   Procedure: HOLMIUM LASER APPLICATION;  Surgeon: Rana Snare, MD;  Location: [facility];  Service: Urology;  Laterality: N/A;  . KNEE SURGERY Bilateral [place]  . MELANOMA EXCISION Bilateral June 2016   20th-- left leg //  1st-- right leg w/ skin graft from right thigh (no lymph node bx)  . NEPHROLITHOTOMY Right 05/17/2014   Procedure: NEPHROLITHOTOMY PERCUTANEOUS;  Surgeon: Bernestine Amass, MD;  Location:  [facility];  Service: Urology;  Laterality: Right;  . PILONIDAL CYST EXCISION    . ROBOTIC ASSITED PARTIAL NEPHRECTOMY Left 06/18/2013   Procedure: ROBOTIC ASSISTED PARTIAL NEPHRECTOMY;  Surgeon: Dutch Gray, MD;  Location: [facility];  Service: Urology;  Laterality: Left;  . ROTATOR CUFF REPAIR Left 07/2016  . TONSILLECTOMY  86  (age 76)  . TRANSURETHRAL RESECTION OF PROSTATE N/A 01/04/2017   Procedure: TRANSURETHRAL RESECTION OF THE PROSTATE (TURP);  Surgeon: Ardis Hughs, MD;  Location: [facility];  Service: Urology;  Laterality:  N/A;    Social History   Social History  . Marital status: Married    Spouse name: N/A  . Number of children: 2  . Years of education: 16   Occupational History  . Government social research officer, Psychologist, occupational    Social History Main Topics  . Smoking status: Former Smoker    Packs/day: 1.00    Years: 6.00    Types: Cigarettes    Quit date: 10/31/1969  . Smokeless tobacco: Never Used  . Alcohol use 0.0 oz/week     Comment: OCCASIONAL  . Drug use: No  . Sexual activity: No   Other Topics Concern  . Not on file   Social History Narrative   Regular exercise-no    Current Outpatient Prescriptions on File Prior to Visit  Medication Sig Dispense Refill  . acetaminophen (TYLENOL) 500 MG tablet Take 1,000 mg by mouth daily.    Marland Kitchen amLODipine (NORVASC) 5 MG tablet Take 5 mg by mouth daily. Patient takes in am     . aspirin 81 MG tablet Take 81 mg by mouth daily.    Marland Kitchen doxazosin (CARDURA) 8 MG tablet Take 8 mg by mouth at bedtime.     . Insulin Glargine (BASAGLAR KWIKPEN) 100 UNIT/ML SOPN Inject 2.4 mLs (240 Units total) into the skin every morning. And pen needles 3/day (Patient taking differently: Inject 240 Units into the skin daily as needed (if blood sugar is greater than 120). And pen needles 3/day) 80 pen 3  . metoprolol (LOPRESSOR) 50 MG tablet Take 50 mg by mouth 2 (two) times daily.     . ONE TOUCH ULTRA TEST test strip USE 1 EACH 2 TIMES DAILY                              . 150 each 10  . potassium citrate (UROCIT-K) 10 MEQ (1080 MG) SR tablet Take 2 tablets (20 mEq total) by mouth 2 (two) times daily. (Patient taking differently: Take 20 mEq by mouth every evening. ) 120 tablet 60  . pravastatin (PRAVACHOL) 40 MG tablet Take 40 mg by mouth every evening.     . valsartan-hydrochlorothiazide (DIOVAN-HCT) 320-25 MG per tablet Take 1 tablet by mouth every evening.     . docusate sodium (COLACE) 100 MG capsule Take 1 capsule (100 mg total) by mouth 2 (two) times daily. (Patient not taking:  Reported on 01/25/2017) 60 capsule 0  . traMADol (ULTRAM) 50 MG tablet Take 2 tablets (100 mg total) by mouth every 6 (six) hours as needed for moderate pain. (Patient not taking: Reported on 01/25/2017) 20 tablet 0   No current facility-administered medications on file prior to visit.     Allergies  Allergen Reactions  . Actos [Pioglitazone] Swelling    edema    Family History  Problem Relation Age of Onset  . Arthritis Father   . Hypertension Father   .  Diabetes Father   . Arthritis Mother   . Hypertension Mother   . Heart attack Brother 87  . Diabetes Brother   . Lymphoma Paternal Grandfather   . Cancer Maternal Uncle   . Cancer Paternal Uncle     type unknown    BP 132/84   Pulse 62   Ht 5\' 11"  (1.803 m)   Wt 261 lb (118.4 kg)   SpO2 97%   BMI 36.40 kg/m    Review of Systems  He denies hypoglycemia.     Objective:   Physical Exam VITAL SIGNS:  See vs page.  GENERAL: no distress.  Pulses: dorsalis pedis intact bilat.  Feet: no deformity. feet are of normal color and temp. There are healed ulcers, and severe hyperpigmentation of the ant tibial areas. The left posterior tibial area has a healed ulcer. There is severe bilateral onychomycosis of the toenails.  CV: trace bilat leg edema.  Skin: no ulceron the feet, but the skin is dry.  Neuro: sensation is intact to touch on the feet.  A1c=7.5%     Assessment & Plan:  Type 2 DM, with CAD: this is the best control this pt should aim for, given this regimen, which does match insulin to her changing needs throughout the day.  Patient is advised the following: Patient Instructions  check your blood sugar once a day.  vary the time of day when you check, between before the 3 meals, and at bedtime.  also check if you have symptoms of your blood sugar being too high or too low.  please keep a record of the readings and bring it to your next appointment here.  please call us sooner if you are having low blood sugar  episodes, or if it is running above 200.  On this type of insulin schedule, you should eat meals on a regular schedule.  If a meal is missed or significantly delayed, your blood sugar could go low.   Please continue the same insulin.  Please come back for a follow-up appointment in 4 months.

## 2017-01-30 ENCOUNTER — Other Ambulatory Visit (INDEPENDENT_AMBULATORY_CARE_PROVIDER_SITE_OTHER): Payer: Self-pay

## 2017-01-30 DIAGNOSIS — M1712 Unilateral primary osteoarthritis, left knee: Secondary | ICD-10-CM

## 2017-01-31 ENCOUNTER — Encounter (HOSPITAL_COMMUNITY): Payer: Self-pay

## 2017-02-01 ENCOUNTER — Encounter (HOSPITAL_COMMUNITY)
Admission: RE | Admit: 2017-02-01 | Discharge: 2017-02-01 | Disposition: A | Discharge status: Home / Self Care | Payer: Managed Care, Other (non HMO) | Source: Ambulatory Visit | Attending: Orthopaedic Surgery | Admitting: Orthopaedic Surgery

## 2017-02-01 ENCOUNTER — Encounter (HOSPITAL_COMMUNITY): Payer: Self-pay

## 2017-02-01 DIAGNOSIS — I2510 Atherosclerotic heart disease of native coronary artery without angina pectoris: Secondary | ICD-10-CM | POA: Insufficient documentation

## 2017-02-01 DIAGNOSIS — Z01812 Encounter for preprocedural laboratory examination: Secondary | ICD-10-CM | POA: Diagnosis not present

## 2017-02-01 DIAGNOSIS — N200 Calculus of kidney: Secondary | ICD-10-CM | POA: Insufficient documentation

## 2017-02-01 DIAGNOSIS — E119 Type 2 diabetes mellitus without complications: Secondary | ICD-10-CM | POA: Insufficient documentation

## 2017-02-01 DIAGNOSIS — Z7901 Long term (current) use of anticoagulants: Secondary | ICD-10-CM | POA: Insufficient documentation

## 2017-02-01 HISTORY — DX: Sleep apnea, unspecified: G47.30

## 2017-02-01 HISTORY — DX: Anxiety disorder, unspecified: F41.9

## 2017-02-01 LAB — TYPE AND SCREEN
ABO/RH(D): A POS
Antibody Screen: NEGATIVE

## 2017-02-01 LAB — COMPREHENSIVE METABOLIC PANEL
ALT: 34 U/L (ref 17–63)
AST: 30 U/L (ref 15–41)
Albumin: 3.9 g/dL (ref 3.5–5.0)
Alkaline Phosphatase: 55 U/L (ref 38–126)
Anion gap: 11 (ref 5–15)
BUN: 22 mg/dL — ABNORMAL HIGH (ref 6–20)
CO2: 28 mmol/L (ref 22–32)
Calcium: 9.2 mg/dL (ref 8.9–10.3)
Chloride: 100 mmol/L — ABNORMAL LOW (ref 101–111)
Creatinine, Ser: 0.96 mg/dL (ref 0.61–1.24)
GFR calc Af Amer: >60 mL/min (ref >60)
GFR calc non Af Amer: >60 mL/min (ref >60)
Glucose, Bld: 195 mg/dL — ABNORMAL HIGH (ref 65–99)
Potassium: 3.6 mmol/L (ref 3.5–5.1)
Sodium: 139 mmol/L (ref 135–145)
Total Bilirubin: 0.7 mg/dL (ref 0.3–1.2)
Total Protein: 6.2 g/dL — ABNORMAL LOW (ref 6.5–8.1)

## 2017-02-01 LAB — SEDIMENTATION RATE: Sed Rate: 3 mm/hr (ref 0–16)

## 2017-02-01 LAB — CBC WITH DIFFERENTIAL/PLATELET
Basophils Absolute: 0 10*3/uL (ref 0.0–0.1)
Basophils Relative: 1 %
Eosinophils Absolute: 0.1 10*3/uL (ref 0.0–0.7)
Eosinophils Relative: 2 %
HCT: 44.6 % (ref 39.0–52.0)
Hemoglobin: 14.9 g/dL (ref 13.0–17.0)
Lymphocytes Relative: 25 %
Lymphs Abs: 1.3 10*3/uL (ref 0.7–4.0)
MCH: 27 pg (ref 26.0–34.0)
MCHC: 33.4 g/dL (ref 30.0–36.0)
MCV: 80.8 fL (ref 78.0–100.0)
Monocytes Absolute: 0.4 10*3/uL (ref 0.1–1.0)
Monocytes Relative: 7 %
Neutro Abs: 3.3 10*3/uL (ref 1.7–7.7)
Neutrophils Relative %: 65 %
Platelets: 162 10*3/uL (ref 150–400)
RBC: 5.52 MIL/uL (ref 4.22–5.81)
RDW: 14 % (ref 11.5–15.5)
WBC: 5.2 10*3/uL (ref 4.0–10.5)

## 2017-02-01 LAB — URINALYSIS, ROUTINE W REFLEX MICROSCOPIC
Bilirubin Urine: NEGATIVE
Glucose, UA: NEGATIVE mg/dL
Ketones, ur: NEGATIVE mg/dL
Nitrite: NEGATIVE
Protein, ur: 30 mg/dL — AB
Specific Gravity, Urine: 1.02 (ref 1.005–1.030)
Squamous Epithelial / LPF: NONE SEEN
pH: 5 (ref 5.0–8.0)

## 2017-02-01 LAB — SURGICAL PCR SCREEN
MRSA, PCR: NEGATIVE
Staphylococcus aureus: NEGATIVE

## 2017-02-01 LAB — C-REACTIVE PROTEIN: CRP: <0.8 mg/dL (ref <1.0)

## 2017-02-01 LAB — GLUCOSE, CAPILLARY: Glucose-Capillary: 204 mg/dL — ABNORMAL HIGH (ref 65–99)

## 2017-02-01 LAB — APTT: aPTT: 33 seconds (ref 24–36)

## 2017-02-01 LAB — PROTIME-INR
INR: 1.03
Prothrombin Time: 13.6 seconds (ref 11.4–15.2)

## 2017-02-01 LAB — ABO/RH: ABO/RH(D): A POS

## 2017-02-01 NOTE — Pre-Procedure Instructions (Signed)
Matthew Schmitt  02/01/2017      [id number] - [name], [place] [address] Phone: [phone] Fax: [place], [address] [place] [address] Phone: [phone] Fax: [phone]    Your procedure is scheduled on 02-07-2017  Thursday .  Report to [facility] Admitting at 10:15 A.M.   Call this number if you have problems the morning of surgery:  [phone]   Remember:  Do not eat food or drink liquids after midnight.   Take these medicines the morning of surgery with A SIP OF WATER Tylenol if needed,Amlodipine(Norvasc),Doxazosin(Cardura),metoprolol(Lopressor)    STOP ASPIRIN,ANTIINFLAMATORIES (IBUPROFEN,ALEVE,MOTRIN,ADVIL,GOODY'S POWDERS),HERBAL SUPPLEMENTS,FISH OIL,AND VITAMINS 5-7 DAYS PRIOR TO SURGERY        How to Manage Your Diabetes Before and After Surgery  Why is it important to control my blood sugar before and after surgery? . Improving blood sugar levels before and after surgery helps healing and can limit problems. . A way of improving blood sugar control is eating a healthy diet by: o  Eating less sugar and carbohydrates o  Increasing activity/exercise o  Talking with your doctor about reaching your blood sugar goals . High blood sugars (greater than 180 mg/dL) can raise your risk of infections and slow your recovery, so you will need to focus on controlling your diabetes during the weeks before surgery. . Make sure that the doctor who takes care of your diabetes knows about your planned surgery including the date and location.  How do I manage my blood sugar before surgery? . Check your blood sugar at least 4 times a day, starting 2 days before surgery, to make sure that the level is not too high or low. o Check your blood sugar the morning of your surgery when you wake up and every 2  hours until you get to the Short Stay unit. . If your blood sugar is less than 70 mg/dL, you will need to treat for low blood sugar: o Do not take insulin. o Treat a low blood sugar (less than 70 mg/dL) with  cup of clear juice (cranberry or apple), 4 glucose tablets, OR glucose gel. o Recheck blood sugar in 15 minutes after treatment (to make sure it is greater than 70 mg/dL). If your blood sugar is not greater than 70 mg/dL on recheck, call [phone] for further instructions. . Report your blood sugar to the short stay nurse when you get to Short Stay.  . If you are admitted to the hospital after surgery: o Your blood sugar will be checked by the staff and you will probably be given insulin after surgery (instead of oral diabetes medicines) to make sure you have good blood sugar levels. o The goal for blood sugar control after surgery is 80-180 mg/dL.              WHAT DO I DO ABOUT MY DIABETES MEDICATION?   Marland Kitchen Do not take oral diabetes medicines (pills) the morning of surgery.  .       . HE MORNING OF SURGERY, take _120 units of glargine(Toujeo)insulin.  . The day of surgery, do not take other diabetes injectables, including Byetta (exenatide), Bydureon (exenatide ER), Victoza (liraglutide), or Trulicity (dulaglutide)   Reviewed and Endorsed by [facility] Patient Education Committee, August 2015   Do not wear jewelry,  Do  not wear lotions, powders, or perfumes, or deoderant.  Do not shave 48 hours prior to surgery.  Men may shave face and neck.   Do not bring valuables to the hospital.  [facility] is not responsible for any belongings or valuables.  Contacts, dentures or bridgework may not be worn into surgery.  Leave your suitcase in the car.  After surgery it may be brought to your room.  For patients admitted to the hospital, discharge time will be determined by your treatment team.  Patients discharged the day of surgery will not be allowed to drive home.        Special Instructions: [place] - Preparing for Surgery  Before surgery, you can play an important role.  Because skin is not sterile, your skin needs to be as free of germs as possible.  You can reduce the number of germs on you skin by washing with CHG (chlorahexidine gluconate) soap before surgery.  CHG is an antiseptic cleaner which kills germs and bonds with the skin to continue killing germs even after washing.  Please DO NOT use if you have an allergy to CHG or antibacterial soaps.  If your skin becomes reddened/irritated stop using the CHG and inform your nurse when you arrive at Short Stay.  Do not shave (including legs and underarms) for at least 48 hours prior to the first CHG shower.  You may shave your face.  Please follow these instructions carefully:   1.  Shower with CHG Soap the night before surgery and the   morning of Surgery.  2.  If you choose to wash your hair, wash your hair first as usual with your normal shampoo.  3.  After you shampoo, rinse your hair and body thoroughly to remove the  Shampoo.  4.  Use CHG as you would any other liquid soap.  You can apply chg directly  to the skin and wash gently with scrungie or a clean washcloth.  5.  Apply the CHG Soap to your body ONLY FROM THE NECK DOWN.   Do not use on open wounds or open sores.  Avoid contact with your eyes,  ears, mouth and genitals (private parts).  Wash genitals (private parts) with your normal soap.  6.  Wash thoroughly, paying special attention to the area where your surgery will be performed.  7.  Thoroughly rinse your body with warm water from the neck down.  8.  DO NOT shower/wash with your normal soap after using and rinsing o  the CHG Soap.  9.  Pat yourself dry with a clean towel.            10.  Wear clean pajamas.            11.  Place clean sheets on your bed the night of your first shower and do not sleep with pets.  Day of Surgery  Do not apply any lotions/deodorants the morning  of surgery.  Please wear clean clothes to the hospital/surgery center.      Please read over the following fact sheets that you were given. Pain Booklet, Coughing and Deep Breathing and Surgical Site Infection Prevention

## 2017-02-05 ENCOUNTER — Telehealth (INDEPENDENT_AMBULATORY_CARE_PROVIDER_SITE_OTHER): Payer: Self-pay | Admitting: Orthopaedic Surgery

## 2017-02-05 NOTE — Telephone Encounter (Signed)
Fenda called from [facility] asking about the patients insurance needing authorization from Dr. Lovina Reach insurance # [phone]  Fenda's # [phone]  Case # [id number]

## 2017-02-05 NOTE — Progress Notes (Addendum)
Anesthesia Chart Review:  Pt is a 66 year old male scheduled for L total knee arthroplasty on 02/07/2017 with Frankey Shown, MD.   - Cardiologist is W. Tollie Eth, MD who cleared pt for surgery at last office visit 12/11/16 - Endocrinologist is Renato Shin, MD, last office visit 01/25/17  PMH includes:  CAD, PAF, heart murmur, HTN, DM, sleep apnea, hyperlipidemia, melanoma, renal cell carcinoma (s/p L partial nephrectomy 06/18/13). Former smoker. BMI 36. S/p cystoscopy 01/04/17, 05/18/15, 11/03/12.   Medications include: Amlodipine, ASA 81 mg, doxazosin, basaglar, metoprolol, potassium, pravastatin, valsartan-HCTZ.  Preoperative labs reviewed.   - UA consistent with UTI - Glucose 195. HbA1c 8.0 on 12/28/16 - Dr. Erlinda Hong has reviewed lab results. I also left voicemail for Sherrie in his office.    EKG 12/28/16: NSR with sinus arrhythmia. LAD. RBBB. LVH with repolarization abnormality Consider Lateral ischemia.   Nuclear stress tests 01/05/16 (Dr. Thurman Coyer office): 1. Normal Lexiscan Myoview scan with no evidence of ischemia or infarction. 2. Normal quantitative gated SPECT EF 50% with normal wall motion and wall thickening.  Echo 06/03/13 (Dr. Thurman Coyer office): 1. Mildly dilated LV with moderate LVH. EF 60% 2. Moderate aortic regurgitation. 3. Mild thickened aortic valve area difficult to tell bicuspid or not. 4. Moderate LA enlargement 5. Mild aortic root enlargement  Cardiac cath 08/17/11:  1. Possible moderate (60%) LAD stenosis just after the bifurcation of a large diagonal branch. 2. Abnormal left ventricular function with an ejection fraction of 40-45% mild global hypokinesis.  If no changes, I anticipate pt can proceed with surgery as scheduled.   Willeen Cass, FNP-BC [facility] Short Stay Surgical [facility]/Anesthesiology Phone: [phone] 02/06/2017 2:29 PM

## 2017-02-06 ENCOUNTER — Other Ambulatory Visit (INDEPENDENT_AMBULATORY_CARE_PROVIDER_SITE_OTHER): Payer: Self-pay | Admitting: Radiology

## 2017-02-06 MED ORDER — CIPROFLOXACIN HCL 500 MG PO TABS: 500.0000 mg | ORAL_TABLET | Freq: Two times a day (BID) | ORAL | 0 refills | Status: DC

## 2017-02-06 MED ORDER — BUPIVACAINE LIPOSOME 1.3 % IJ SUSP
20.0000 mL | Freq: Once | INTRAMUSCULAR | Status: DC
  Filled 2017-02-06: qty 20

## 2017-02-06 MED ORDER — SODIUM CHLORIDE 0.9 % IV SOLN
1000.0000 mg | INTRAVENOUS | Status: AC
  Administered 2017-02-07: 1000 mg via INTRAVENOUS
  Filled 2017-02-06: qty 10

## 2017-02-06 NOTE — Telephone Encounter (Signed)
Message was left on triage phone this morning at 830am, needing peer to peer for surgery. Information given to [facility].

## 2017-02-07 ENCOUNTER — Inpatient Hospital Stay (HOSPITAL_COMMUNITY)
Admission: RE | Admit: 2017-02-07 | Discharge: 2017-02-09 | DRG: 470 | Disposition: A | Discharge status: Home Health | Payer: Managed Care, Other (non HMO) | Source: Ambulatory Visit | Attending: Orthopaedic Surgery | Admitting: Orthopaedic Surgery

## 2017-02-07 ENCOUNTER — Inpatient Hospital Stay (HOSPITAL_COMMUNITY): Payer: Managed Care, Other (non HMO) | Admitting: Certified Registered Nurse Anesthetist

## 2017-02-07 ENCOUNTER — Encounter (HOSPITAL_COMMUNITY): Payer: Self-pay | Admitting: General Practice

## 2017-02-07 ENCOUNTER — Encounter (HOSPITAL_COMMUNITY)
Admission: RE | Disposition: A | Discharge status: Home Health | Payer: Self-pay | Source: Ambulatory Visit | Attending: Orthopaedic Surgery

## 2017-02-07 ENCOUNTER — Inpatient Hospital Stay (HOSPITAL_COMMUNITY): Payer: Managed Care, Other (non HMO)

## 2017-02-07 ENCOUNTER — Inpatient Hospital Stay (HOSPITAL_COMMUNITY): Payer: Managed Care, Other (non HMO) | Admitting: Emergency Medicine

## 2017-02-07 DIAGNOSIS — Z85528 Personal history of other malignant neoplasm of kidney: Secondary | ICD-10-CM | POA: Diagnosis not present

## 2017-02-07 DIAGNOSIS — Z888 Allergy status to other drugs, medicaments and biological substances status: Secondary | ICD-10-CM | POA: Diagnosis not present

## 2017-02-07 DIAGNOSIS — E119 Type 2 diabetes mellitus without complications: Secondary | ICD-10-CM | POA: Diagnosis present

## 2017-02-07 DIAGNOSIS — Z96659 Presence of unspecified artificial knee joint: Secondary | ICD-10-CM

## 2017-02-07 DIAGNOSIS — E782 Mixed hyperlipidemia: Secondary | ICD-10-CM | POA: Diagnosis present

## 2017-02-07 DIAGNOSIS — F17200 Nicotine dependence, unspecified, uncomplicated: Secondary | ICD-10-CM | POA: Diagnosis present

## 2017-02-07 DIAGNOSIS — I2510 Atherosclerotic heart disease of native coronary artery without angina pectoris: Secondary | ICD-10-CM | POA: Diagnosis present

## 2017-02-07 DIAGNOSIS — Z7982 Long term (current) use of aspirin: Secondary | ICD-10-CM | POA: Diagnosis not present

## 2017-02-07 DIAGNOSIS — I440 Atrioventricular block, first degree: Secondary | ICD-10-CM | POA: Diagnosis present

## 2017-02-07 DIAGNOSIS — I10 Essential (primary) hypertension: Secondary | ICD-10-CM | POA: Diagnosis present

## 2017-02-07 DIAGNOSIS — Z79899 Other long term (current) drug therapy: Secondary | ICD-10-CM

## 2017-02-07 DIAGNOSIS — M1712 Unilateral primary osteoarthritis, left knee: Principal | ICD-10-CM | POA: Diagnosis present

## 2017-02-07 DIAGNOSIS — D62 Acute posthemorrhagic anemia: Secondary | ICD-10-CM | POA: Diagnosis not present

## 2017-02-07 DIAGNOSIS — Z905 Acquired absence of kidney: Secondary | ICD-10-CM

## 2017-02-07 DIAGNOSIS — Z96653 Presence of artificial knee joint, bilateral: Secondary | ICD-10-CM

## 2017-02-07 DIAGNOSIS — G4730 Sleep apnea, unspecified: Secondary | ICD-10-CM | POA: Diagnosis present

## 2017-02-07 DIAGNOSIS — Z85820 Personal history of malignant melanoma of skin: Secondary | ICD-10-CM | POA: Diagnosis not present

## 2017-02-07 HISTORY — PX: TOTAL KNEE ARTHROPLASTY: SHX125

## 2017-02-07 HISTORY — DX: Malignant neoplasm of unspecified kidney, except renal pelvis: C64.9

## 2017-02-07 HISTORY — DX: Malignant melanoma of right lower limb, including hip: C43.71

## 2017-02-07 HISTORY — DX: Presence of artificial knee joint, bilateral: Z96.653

## 2017-02-07 LAB — GLUCOSE, CAPILLARY
Glucose-Capillary: 163 mg/dL — ABNORMAL HIGH (ref 65–99)
Glucose-Capillary: 89 mg/dL (ref 65–99)
Glucose-Capillary: 78 mg/dL (ref 65–99)
Glucose-Capillary: 123 mg/dL — ABNORMAL HIGH (ref 65–99)

## 2017-02-07 SURGERY — ARTHROPLASTY, KNEE, TOTAL
Anesthesia: Spinal | Site: Knee | Laterality: Left

## 2017-02-07 MED ORDER — 0.9 % SODIUM CHLORIDE (POUR BTL) OPTIME
TOPICAL | Status: DC | PRN
  Administered 2017-02-07: 1000 mL

## 2017-02-07 MED ORDER — FENTANYL CITRATE (PF) 100 MCG/2ML IJ SOLN
INTRAMUSCULAR | Status: AC
  Filled 2017-02-07: qty 2

## 2017-02-07 MED ORDER — SODIUM CHLORIDE 0.9 % IV SOLN
INTRAVENOUS | Status: DC
  Administered 2017-02-07: 17:00:00 via INTRAVENOUS

## 2017-02-07 MED ORDER — SULFAMETHOXAZOLE-TRIMETHOPRIM 800-160 MG PO TABS
1.0000 | ORAL_TABLET | Freq: Two times a day (BID) | ORAL | Status: DC
  Administered 2017-02-07 – 2017-02-09 (×4): 1 via ORAL
  Filled 2017-02-07 (×4): qty 1

## 2017-02-07 MED ORDER — 0.9 % SODIUM CHLORIDE (POUR BTL) OPTIME
TOPICAL | Status: DC | PRN
  Administered 2017-02-07: 500 mL

## 2017-02-07 MED ORDER — KETOROLAC TROMETHAMINE 15 MG/ML IJ SOLN
15.0000 mg | Freq: Four times a day (QID) | INTRAMUSCULAR | Status: AC
  Administered 2017-02-08 (×3): 15 mg via INTRAVENOUS
  Filled 2017-02-07 (×3): qty 1

## 2017-02-07 MED ORDER — ONDANSETRON HCL 4 MG PO TABS: 4.0000 mg | ORAL_TABLET | Freq: Three times a day (TID) | ORAL | 0 refills | Status: DC | PRN

## 2017-02-07 MED ORDER — LACTATED RINGERS IV SOLN
INTRAVENOUS | Status: DC
  Administered 2017-02-07 (×2): via INTRAVENOUS

## 2017-02-07 MED ORDER — ASPIRIN EC 325 MG PO TBEC
325.0000 mg | DELAYED_RELEASE_TABLET | Freq: Two times a day (BID) | ORAL | Status: DC
  Administered 2017-02-08 – 2017-02-09 (×3): 325 mg via ORAL
  Filled 2017-02-07 (×3): qty 1

## 2017-02-07 MED ORDER — BUPIVACAINE-EPINEPHRINE (PF) 0.5% -1:200000 IJ SOLN
INTRAMUSCULAR | Status: DC | PRN
Start: 2017-02-07 — End: 2017-02-07
  Administered 2017-02-07: 30 mL via PERINEURAL

## 2017-02-07 MED ORDER — DEXAMETHASONE SODIUM PHOSPHATE 10 MG/ML IJ SOLN
10.0000 mg | Freq: Once | INTRAMUSCULAR | Status: AC
  Administered 2017-02-08: 10 mg via INTRAVENOUS
  Filled 2017-02-07: qty 1

## 2017-02-07 MED ORDER — MORPHINE SULFATE (PF) 2 MG/ML IV SOLN: 1.0000 mg | INTRAVENOUS | Status: DC | PRN

## 2017-02-07 MED ORDER — DIPHENHYDRAMINE HCL 12.5 MG/5ML PO ELIX: 25.0000 mg | ORAL_SOLUTION | ORAL | Status: DC | PRN

## 2017-02-07 MED ORDER — POLYETHYLENE GLYCOL 3350 17 G PO PACK
17.0000 g | PACK | Freq: Every day | ORAL | Status: DC | PRN
  Administered 2017-02-08: 17 g via ORAL
  Filled 2017-02-07: qty 1

## 2017-02-07 MED ORDER — INSULIN ASPART 100 UNIT/ML ~~LOC~~ SOLN
0.0000 [IU] | Freq: Every day | SUBCUTANEOUS | Status: DC
  Administered 2017-02-08: 2 [IU] via SUBCUTANEOUS

## 2017-02-07 MED ORDER — BUPIVACAINE LIPOSOME 1.3 % IJ SUSP
INTRAMUSCULAR | Status: DC | PRN
  Administered 2017-02-07: 20 mL

## 2017-02-07 MED ORDER — PRAVASTATIN SODIUM 40 MG PO TABS
40.0000 mg | ORAL_TABLET | Freq: Every evening | ORAL | Status: DC
  Administered 2017-02-07 – 2017-02-08 (×2): 40 mg via ORAL
  Filled 2017-02-07 (×2): qty 1

## 2017-02-07 MED ORDER — FENTANYL CITRATE (PF) 100 MCG/2ML IJ SOLN
100.0000 ug | Freq: Once | INTRAMUSCULAR | Status: AC
  Administered 2017-02-07: 100 ug via INTRAVENOUS

## 2017-02-07 MED ORDER — DEXTROSE 5 % IV SOLN
INTRAVENOUS | Status: DC | PRN
  Administered 2017-02-07: 25 ug/min via INTRAVENOUS

## 2017-02-07 MED ORDER — SODIUM CHLORIDE 0.9 % IJ SOLN
INTRAMUSCULAR | Status: DC | PRN
  Administered 2017-02-07: 20 mL

## 2017-02-07 MED ORDER — POTASSIUM CITRATE ER 10 MEQ (1080 MG) PO TBCR
20.0000 meq | EXTENDED_RELEASE_TABLET | Freq: Every evening | ORAL | Status: DC
  Administered 2017-02-07 – 2017-02-08 (×2): 20 meq via ORAL
  Filled 2017-02-07 (×2): qty 2

## 2017-02-07 MED ORDER — ACETAMINOPHEN 650 MG RE SUPP: 650.0000 mg | Freq: Four times a day (QID) | RECTAL | Status: DC | PRN

## 2017-02-07 MED ORDER — CEFAZOLIN SODIUM-DEXTROSE 2-4 GM/100ML-% IV SOLN
2.0000 g | Freq: Four times a day (QID) | INTRAVENOUS | Status: AC
  Administered 2017-02-07 – 2017-02-08 (×2): 2 g via INTRAVENOUS
  Filled 2017-02-07 (×3): qty 100

## 2017-02-07 MED ORDER — PROMETHAZINE HCL 25 MG PO TABS: 25.0000 mg | ORAL_TABLET | Freq: Four times a day (QID) | ORAL | 1 refills | Status: DC | PRN

## 2017-02-07 MED ORDER — MAGNESIUM CITRATE PO SOLN: 1.0000 | Freq: Once | ORAL | Status: DC | PRN

## 2017-02-07 MED ORDER — ACETAMINOPHEN 500 MG PO TABS
1000.0000 mg | ORAL_TABLET | Freq: Four times a day (QID) | ORAL | Status: AC
  Administered 2017-02-07 – 2017-02-08 (×4): 1000 mg via ORAL
  Filled 2017-02-07 (×4): qty 2

## 2017-02-07 MED ORDER — ACETAMINOPHEN 325 MG PO TABS: 650.0000 mg | ORAL_TABLET | Freq: Four times a day (QID) | ORAL | Status: DC | PRN

## 2017-02-07 MED ORDER — MENTHOL 3 MG MT LOZG: 1.0000 | LOZENGE | OROMUCOSAL | Status: DC | PRN

## 2017-02-07 MED ORDER — SORBITOL 70 % SOLN: 30.0000 mL | Freq: Every day | Status: DC | PRN

## 2017-02-07 MED ORDER — METOCLOPRAMIDE HCL 5 MG/ML IJ SOLN: 5.0000 mg | Freq: Three times a day (TID) | INTRAMUSCULAR | Status: DC | PRN

## 2017-02-07 MED ORDER — LIDOCAINE 2% (20 MG/ML) 5 ML SYRINGE
INTRAMUSCULAR | Status: AC
  Filled 2017-02-07: qty 5

## 2017-02-07 MED ORDER — ACETAMINOPHEN 500 MG PO TABS: 1000.0000 mg | ORAL_TABLET | Freq: Every day | ORAL | Status: DC

## 2017-02-07 MED ORDER — METHOCARBAMOL 500 MG PO TABS
500.0000 mg | ORAL_TABLET | Freq: Four times a day (QID) | ORAL | Status: DC | PRN
  Administered 2017-02-08 (×2): 500 mg via ORAL
  Filled 2017-02-07 (×3): qty 1

## 2017-02-07 MED ORDER — METOCLOPRAMIDE HCL 5 MG/ML IJ SOLN: 10.0000 mg | Freq: Once | INTRAMUSCULAR | Status: DC | PRN

## 2017-02-07 MED ORDER — BUPIVACAINE IN DEXTROSE 0.75-8.25 % IT SOLN
INTRATHECAL | Status: DC | PRN
  Administered 2017-02-07: 1.6 mL via INTRATHECAL

## 2017-02-07 MED ORDER — VALSARTAN-HYDROCHLOROTHIAZIDE 320-25 MG PO TABS: 1.0000 | ORAL_TABLET | Freq: Every evening | ORAL | Status: DC

## 2017-02-07 MED ORDER — MIDAZOLAM HCL 5 MG/5ML IJ SOLN
INTRAMUSCULAR | Status: DC | PRN
  Administered 2017-02-07: 2 mg via INTRAVENOUS

## 2017-02-07 MED ORDER — CHLORHEXIDINE GLUCONATE 4 % EX LIQD: 60.0000 mL | Freq: Once | CUTANEOUS | Status: DC

## 2017-02-07 MED ORDER — METHOCARBAMOL 750 MG PO TABS: 750.0000 mg | ORAL_TABLET | Freq: Two times a day (BID) | ORAL | 0 refills | Status: DC | PRN

## 2017-02-07 MED ORDER — METHOCARBAMOL 1000 MG/10ML IJ SOLN
500.0000 mg | Freq: Four times a day (QID) | INTRAVENOUS | Status: DC | PRN
  Administered 2017-02-07: 500 mg via INTRAVENOUS
  Filled 2017-02-07 (×3): qty 5

## 2017-02-07 MED ORDER — PHENYLEPHRINE 40 MCG/ML (10ML) SYRINGE FOR IV PUSH (FOR BLOOD PRESSURE SUPPORT)
PREFILLED_SYRINGE | INTRAVENOUS | Status: AC
  Filled 2017-02-07: qty 10

## 2017-02-07 MED ORDER — ASPIRIN EC 325 MG PO TBEC: 325.0000 mg | DELAYED_RELEASE_TABLET | Freq: Two times a day (BID) | ORAL | 0 refills | Status: DC

## 2017-02-07 MED ORDER — OXYCODONE HCL ER 10 MG PO T12A: 10.0000 mg | EXTENDED_RELEASE_TABLET | Freq: Two times a day (BID) | ORAL | 0 refills | Status: DC

## 2017-02-07 MED ORDER — ONDANSETRON HCL 4 MG/2ML IJ SOLN: 4.0000 mg | Freq: Four times a day (QID) | INTRAMUSCULAR | Status: DC | PRN

## 2017-02-07 MED ORDER — KETOROLAC TROMETHAMINE 15 MG/ML IJ SOLN
30.0000 mg | Freq: Four times a day (QID) | INTRAMUSCULAR | Status: DC
  Administered 2017-02-07: 30 mg via INTRAVENOUS
  Filled 2017-02-07: qty 2

## 2017-02-07 MED ORDER — PROPOFOL 500 MG/50ML IV EMUL
INTRAVENOUS | Status: DC | PRN
  Administered 2017-02-07: 60 ug/kg/min via INTRAVENOUS
  Administered 2017-02-07: 14:00:00 via INTRAVENOUS

## 2017-02-07 MED ORDER — MIDAZOLAM HCL 2 MG/2ML IJ SOLN
INTRAMUSCULAR | Status: AC
  Filled 2017-02-07: qty 2

## 2017-02-07 MED ORDER — PHENOL 1.4 % MT LIQD: 1.0000 | OROMUCOSAL | Status: DC | PRN

## 2017-02-07 MED ORDER — METOPROLOL TARTRATE 50 MG PO TABS
50.0000 mg | ORAL_TABLET | Freq: Two times a day (BID) | ORAL | Status: DC
  Administered 2017-02-07 – 2017-02-09 (×4): 50 mg via ORAL
  Filled 2017-02-07 (×4): qty 1

## 2017-02-07 MED ORDER — FENTANYL CITRATE (PF) 100 MCG/2ML IJ SOLN
INTRAMUSCULAR | Status: AC
  Filled 2017-02-07: qty 4

## 2017-02-07 MED ORDER — OXYCODONE HCL 5 MG PO TABS
5.0000 mg | ORAL_TABLET | ORAL | Status: DC | PRN
  Administered 2017-02-07 (×2): 10 mg via ORAL
  Filled 2017-02-07 (×2): qty 2

## 2017-02-07 MED ORDER — AMLODIPINE BESYLATE 5 MG PO TABS
5.0000 mg | ORAL_TABLET | Freq: Every day | ORAL | Status: DC
  Administered 2017-02-08 – 2017-02-09 (×2): 5 mg via ORAL
  Filled 2017-02-07 (×2): qty 1

## 2017-02-07 MED ORDER — OXYCODONE HCL 5 MG PO TABS: 5.0000 mg | ORAL_TABLET | ORAL | 0 refills | Status: DC | PRN

## 2017-02-07 MED ORDER — IRBESARTAN 300 MG PO TABS
300.0000 mg | ORAL_TABLET | Freq: Every day | ORAL | Status: DC
  Administered 2017-02-07 – 2017-02-09 (×3): 300 mg via ORAL
  Filled 2017-02-07 (×3): qty 1

## 2017-02-07 MED ORDER — TRANEXAMIC ACID 1000 MG/10ML IV SOLN
INTRAVENOUS | Status: DC | PRN
  Administered 2017-02-07: 2000 mg via TOPICAL

## 2017-02-07 MED ORDER — SULFAMETHOXAZOLE-TRIMETHOPRIM 800-160 MG PO TABS: 1.0000 | ORAL_TABLET | Freq: Two times a day (BID) | ORAL | 0 refills | Status: DC

## 2017-02-07 MED ORDER — ALUM & MAG HYDROXIDE-SIMETH 200-200-20 MG/5ML PO SUSP: 30.0000 mL | ORAL | Status: DC | PRN

## 2017-02-07 MED ORDER — TRANEXAMIC ACID 1000 MG/10ML IV SOLN
1000.0000 mg | Freq: Once | INTRAVENOUS | Status: AC
  Administered 2017-02-07: 1000 mg via INTRAVENOUS
  Filled 2017-02-07: qty 10

## 2017-02-07 MED ORDER — FENTANYL CITRATE (PF) 100 MCG/2ML IJ SOLN
25.0000 ug | INTRAMUSCULAR | Status: DC | PRN
  Administered 2017-02-07 (×3): 50 ug via INTRAVENOUS

## 2017-02-07 MED ORDER — CEFAZOLIN SODIUM-DEXTROSE 2-4 GM/100ML-% IV SOLN
2.0000 g | INTRAVENOUS | Status: AC
  Administered 2017-02-07: 2 g via INTRAVENOUS
  Filled 2017-02-07: qty 100

## 2017-02-07 MED ORDER — MIDAZOLAM HCL 2 MG/2ML IJ SOLN
2.0000 mg | Freq: Once | INTRAMUSCULAR | Status: AC
  Administered 2017-02-07: 2 mg via INTRAVENOUS

## 2017-02-07 MED ORDER — PROPOFOL 10 MG/ML IV BOLUS
INTRAVENOUS | Status: AC
  Filled 2017-02-07: qty 20

## 2017-02-07 MED ORDER — TRANEXAMIC ACID 1000 MG/10ML IV SOLN
2000.0000 mg | Freq: Once | INTRAVENOUS | Status: DC
  Filled 2017-02-07: qty 20

## 2017-02-07 MED ORDER — ONDANSETRON HCL 4 MG PO TABS: 4.0000 mg | ORAL_TABLET | Freq: Four times a day (QID) | ORAL | Status: DC | PRN

## 2017-02-07 MED ORDER — METOCLOPRAMIDE HCL 5 MG PO TABS: 5.0000 mg | ORAL_TABLET | Freq: Three times a day (TID) | ORAL | Status: DC | PRN

## 2017-02-07 MED ORDER — KETOROLAC TROMETHAMINE 30 MG/ML IJ SOLN
INTRAMUSCULAR | Status: AC
  Administered 2017-02-07: 30 mg
  Filled 2017-02-07: qty 1

## 2017-02-07 MED ORDER — DOXAZOSIN MESYLATE 8 MG PO TABS
8.0000 mg | ORAL_TABLET | Freq: Every day | ORAL | Status: DC
  Administered 2017-02-08: 8 mg via ORAL
  Filled 2017-02-07: qty 1

## 2017-02-07 MED ORDER — HYDROCHLOROTHIAZIDE 25 MG PO TABS
25.0000 mg | ORAL_TABLET | Freq: Every day | ORAL | Status: DC
  Administered 2017-02-07 – 2017-02-09 (×3): 25 mg via ORAL
  Filled 2017-02-07 (×3): qty 1

## 2017-02-07 MED ORDER — INSULIN ASPART 100 UNIT/ML ~~LOC~~ SOLN
0.0000 [IU] | Freq: Three times a day (TID) | SUBCUTANEOUS | Status: DC
  Administered 2017-02-08: 3 [IU] via SUBCUTANEOUS
  Administered 2017-02-08: 7 [IU] via SUBCUTANEOUS
  Administered 2017-02-09: 11 [IU] via SUBCUTANEOUS

## 2017-02-07 MED ORDER — OXYCODONE HCL ER 10 MG PO T12A
10.0000 mg | EXTENDED_RELEASE_TABLET | Freq: Two times a day (BID) | ORAL | Status: DC
  Administered 2017-02-07 – 2017-02-09 (×4): 10 mg via ORAL
  Filled 2017-02-07 (×5): qty 1

## 2017-02-07 MED ORDER — CIPROFLOXACIN HCL 500 MG PO TABS
500.0000 mg | ORAL_TABLET | Freq: Two times a day (BID) | ORAL | Status: DC
  Administered 2017-02-07: 500 mg via ORAL
  Filled 2017-02-07: qty 1

## 2017-02-07 MED ORDER — OXYCODONE HCL 5 MG PO TABS
ORAL_TABLET | ORAL | Status: AC
  Filled 2017-02-07: qty 2

## 2017-02-07 MED ORDER — MEPERIDINE HCL 25 MG/ML IJ SOLN
6.2500 mg | INTRAMUSCULAR | Status: DC | PRN
Start: 2017-02-07 — End: 2017-02-07

## 2017-02-07 SURGICAL SUPPLY — 79 items
45MM RIMMED SPEED PIN ×1 IMPLANT
ALCOHOL ISOPROPYL (RUBBING) (MISCELLANEOUS) ×2 IMPLANT
APL SKNCLS STERI-STRIP NONHPOA (GAUZE/BANDAGES/DRESSINGS) ×1
BAG DECANTER FOR FLEXI CONT (MISCELLANEOUS) ×2 IMPLANT
BANDAGE ACE 6X5 VEL STRL LF (GAUZE/BANDAGES/DRESSINGS) ×4 IMPLANT
BANDAGE ESMARK 6X9 LF (GAUZE/BANDAGES/DRESSINGS) ×1 IMPLANT
BENZOIN TINCTURE PRP APPL 2/3 (GAUZE/BANDAGES/DRESSINGS) ×2 IMPLANT
BLADE SAW SGTL 13.0X1.19X90.0M (BLADE) ×2 IMPLANT
BNDG CMPR 9X6 STRL LF SNTH (GAUZE/BANDAGES/DRESSINGS) ×1
BNDG CMPR MED 10X6 ELC LF (GAUZE/BANDAGES/DRESSINGS) ×1
BNDG ELASTIC 6X10 VLCR STRL LF (GAUZE/BANDAGES/DRESSINGS) ×1 IMPLANT
BNDG ESMARK 6X9 LF (GAUZE/BANDAGES/DRESSINGS) ×2
BONE CEMENT PALACOS R-G (Cement) ×4 IMPLANT
BOWL SMART MIX CTS (DISPOSABLE) ×2 IMPLANT
CAPT KNEE TOTAL 3 ×1 IMPLANT
CEMENT BONE PALACOS R-G (Cement) ×2 IMPLANT
CLSR STERI-STRIP ANTIMIC 1/2X4 (GAUZE/BANDAGES/DRESSINGS) ×4 IMPLANT
COVER SURGICAL LIGHT HANDLE (MISCELLANEOUS) ×2 IMPLANT
CUFF TOURNIQUET SINGLE 34IN LL (TOURNIQUET CUFF) ×2 IMPLANT
CUFF TOURNIQUET SINGLE 44IN (TOURNIQUET CUFF) IMPLANT
DRAPE EXTREMITY T 121X128X90 (DRAPE) ×2 IMPLANT
DRAPE HALF SHEET 40X57 (DRAPES) ×1 IMPLANT
DRAPE INCISE IOBAN 66X45 STRL (DRAPES) IMPLANT
DRAPE ORTHO SPLIT 77X108 STRL (DRAPES) ×4
DRAPE PROXIMA HALF (DRAPES) ×2 IMPLANT
DRAPE SURG 17X11 SM STRL (DRAPES) ×4 IMPLANT
DRAPE SURG ORHT 6 SPLT 77X108 (DRAPES) ×2 IMPLANT
DRSG AQUACEL AG ADV 3.5X14 (GAUZE/BANDAGES/DRESSINGS) ×2 IMPLANT
DRSG PAD ABDOMINAL 8X10 ST (GAUZE/BANDAGES/DRESSINGS) ×2 IMPLANT
DURAPREP 26ML APPLICATOR (WOUND CARE) ×4 IMPLANT
ELECT CAUTERY BLADE 6.4 (BLADE) ×2 IMPLANT
ELECT REM PT RETURN 9FT ADLT (ELECTROSURGICAL) ×2
ELECTRODE REM PT RTRN 9FT ADLT (ELECTROSURGICAL) ×1 IMPLANT
FLUID NSS /IRRIG 3000 ML XXX (IV SOLUTION) ×2 IMPLANT
GAUZE SPONGE 4X4 12PLY STRL (GAUZE/BANDAGES/DRESSINGS) ×1 IMPLANT
GAUZE XEROFORM 5X9 LF (GAUZE/BANDAGES/DRESSINGS) ×1 IMPLANT
GLOVE SKINSENSE NS SZ7.5 (GLOVE) ×1
GLOVE SKINSENSE STRL SZ7.5 (GLOVE) ×1 IMPLANT
GLOVE SURG SYN 7.5  E (GLOVE) ×4
GLOVE SURG SYN 7.5 E (GLOVE) ×4 IMPLANT
GLOVE SURG SYN 7.5 PF PI (GLOVE) ×4 IMPLANT
GOWN STRL REIN XL XLG (GOWN DISPOSABLE) ×2 IMPLANT
GOWN STRL REUS W/ TWL LRG LVL3 (GOWN DISPOSABLE) ×1 IMPLANT
GOWN STRL REUS W/TWL LRG LVL3 (GOWN DISPOSABLE) ×2
HANDPIECE INTERPULSE COAX TIP (DISPOSABLE) ×2
HOOD PEEL AWAY FLYTE STAYCOOL (MISCELLANEOUS) ×4 IMPLANT
KIT BASIN OR (CUSTOM PROCEDURE TRAY) ×2 IMPLANT
KIT ROOM TURNOVER OR (KITS) ×2 IMPLANT
MANIFOLD NEPTUNE II (INSTRUMENTS) ×2 IMPLANT
MARKER SKIN DUAL TIP RULER LAB (MISCELLANEOUS) ×2 IMPLANT
NDL SPNL 18GX3.5 QUINCKE PK (NEEDLE) ×1 IMPLANT
NEEDLE SPNL 18GX3.5 QUINCKE PK (NEEDLE) ×2 IMPLANT
NS IRRIG 1000ML POUR BTL (IV SOLUTION) ×2 IMPLANT
PACK TOTAL JOINT (CUSTOM PROCEDURE TRAY) ×2 IMPLANT
PAD ARMBOARD 7.5X6 YLW CONV (MISCELLANEOUS) ×4 IMPLANT
PAD CAST 4YDX4 CTTN HI CHSV (CAST SUPPLIES) IMPLANT
PADDING CAST COTTON 4X4 STRL (CAST SUPPLIES) ×2
SAW OSC TIP CART 19.5X105X1.3 (SAW) ×2 IMPLANT
SET HNDPC FAN SPRY TIP SCT (DISPOSABLE) ×1 IMPLANT
STAPLER VISISTAT 35W (STAPLE) IMPLANT
SUCTION FRAZIER HANDLE 10FR (MISCELLANEOUS) ×1
SUCTION TUBE FRAZIER 10FR DISP (MISCELLANEOUS) ×1 IMPLANT
SUT ETHILON 2 0 FS 18 (SUTURE) ×2 IMPLANT
SUT MNCRL AB 4-0 PS2 18 (SUTURE) IMPLANT
SUT VIC AB 0 CT1 27 (SUTURE) ×4
SUT VIC AB 0 CT1 27XBRD ANBCTR (SUTURE) ×2 IMPLANT
SUT VIC AB 1 CTX 27 (SUTURE) ×6 IMPLANT
SUT VIC AB 1 CTX 36 (SUTURE) ×2
SUT VIC AB 1 CTX36XBRD ANBCTR (SUTURE) IMPLANT
SUT VIC AB 2-0 CT1 27 (SUTURE) ×6
SUT VIC AB 2-0 CT1 TAPERPNT 27 (SUTURE) ×3 IMPLANT
SYR 50ML LL SCALE MARK (SYRINGE) ×2 IMPLANT
SYRINGE 60CC LL (MISCELLANEOUS) ×1 IMPLANT
TOWEL OR 17X24 6PK STRL BLUE (TOWEL DISPOSABLE) ×2 IMPLANT
TOWEL OR 17X26 10 PK STRL BLUE (TOWEL DISPOSABLE) ×2 IMPLANT
TRAY CATH 16FR W/PLASTIC CATH (SET/KITS/TRAYS/PACK) ×1 IMPLANT
UNDERPAD 30X30 (UNDERPADS AND DIAPERS) ×2 IMPLANT
WRAP KNEE MAXI GEL POST OP (GAUZE/BANDAGES/DRESSINGS) ×2 IMPLANT
YANKAUER SUCT BULB TIP NO VENT (SUCTIONS) ×1 IMPLANT

## 2017-02-07 NOTE — OR Nursing (Signed)
1500: in&out cath=250cc cyu per protocol; no trauma.

## 2017-02-07 NOTE — Anesthesia Procedure Notes (Signed)
Procedure Name: MAC Performed by: Valda Favia Pre-anesthesia Checklist: Patient identified, Emergency Drugs available, Suction available, Patient being monitored and Timeout performed Patient Re-evaluated:Patient Re-evaluated prior to inductionOxygen Delivery Method: Simple face mask Preoxygenation: Pre-oxygenation with 100% oxygen Ventilation: Oral airway inserted - appropriate to patient size Number of attempts: 1 Placement Confirmation: positive ETCO2 Dental Injury: Teeth and Oropharynx as per pre-operative assessment

## 2017-02-07 NOTE — Anesthesia Preprocedure Evaluation (Signed)
Anesthesia Evaluation  Patient identified by MRN, date of birth, ID band Patient awake    Reviewed: Allergy & Precautions, NPO status , Patient's Chart, lab work & pertinent test results  Airway Mallampati: II  TM Distance: >3 FB Neck ROM: Full    Dental no notable dental hx.    Pulmonary sleep apnea , former smoker,    Pulmonary exam normal breath sounds clear to auscultation       Cardiovascular hypertension, Pt. on medications and Pt. on home beta blockers + CAD  Normal cardiovascular exam+ dysrhythmias Atrial Fibrillation  Rhythm:Regular Rate:Normal     Neuro/Psych negative neurological ROS  negative psych ROS   GI/Hepatic negative GI ROS, Neg liver ROS,   Endo/Other  diabetes, Insulin Dependent  Renal/GU negative Renal ROS  negative genitourinary   Musculoskeletal negative musculoskeletal ROS (+)   Abdominal   Peds negative pediatric ROS (+)  Hematology negative hematology ROS (+)   Anesthesia Other Findings   Reproductive/Obstetrics negative OB ROS                            Anesthesia Physical Anesthesia Plan  ASA: III  Anesthesia Plan: Spinal   Post-op Pain Management:  Regional for Post-op pain   Induction: Intravenous  Airway Management Planned: Simple Face Mask  Additional Equipment:   Intra-op Plan:   Post-operative Plan:   Informed Consent: I have reviewed the patients History and Physical, chart, labs and discussed the procedure including the risks, benefits and alternatives for the proposed anesthesia with the patient or authorized representative who has indicated his/her understanding and acceptance.   Dental advisory given  Plan Discussed with: CRNA  Anesthesia Plan Comments: (Adductor block)        Anesthesia Quick Evaluation

## 2017-02-07 NOTE — H&P (Signed)
PREOPERATIVE H&P  Chief Complaint: Left knee osteoarthritis  HPI: Matthew Schmitt is a 66 y.o. male who presents for surgical treatment of Left knee osteoarthritis.  He denies any changes in medical history.  Past Medical History:  Diagnosis Date  . Anxiety   . Arthritis    everywhere  . Bladder calculus   . Cancer ([place]) 2014   Melanoma on right leg   . Coronary artery disease    cardiologist-  dr Wynonia Lawman  . Essential hypertension, benign   . First degree heart block   . Heart murmur   . History of kidney stones   . History of melanoma excision    June 2016  --- s/p  excision bilateral leg   . History of renal cell carcinoma    s/p  left partial nephrectomy  . Impotence of organic origin   . Lower urinary tract symptoms (LUTS)   . Mixed hyperlipidemia    slightly  . Multiple renal cysts    bilateral  . PAF (paroxysmal atrial fibrillation) ([place])   . Presence of surgical incision    x2  bilateral leg and right thigh  . RBBB   . Sleep apnea    no CPAP  . Type 2 diabetes mellitus ([place])    type1  . Wears glasses    Past Surgical History:  Procedure Laterality Date  . CARDIAC CATHETERIZATION  08-17-2011  DR SPENCER TILLEY   POSSIBLE MODERATE LAD STENOSIS JUST AFTER THE BIFURCATION OF LARGE DIAGONAL BRANCH/ LVEF  40-45%/ MILD GLOBAL HYPODINESIS (CATH DONE FOR ABNORMAL STRESS TEST OF FIXED LATERAL WALL DEFECT & BIFASCICULAR BLAOCK)  . CARDIOVERSION N/A 04/02/2013   Procedure: CARDIOVERSION;  Surgeon: Jacolyn Reedy, MD;  Location: [place];  Service: Cardiovascular;  Laterality: N/A;  . CYSTOSCOPY WITH LITHOLAPAXY N/A 05/18/2015   Procedure: CYSTOSCOPY WITH LITHOLAPAXY;  Surgeon: Rana Snare, MD;  Location[facility];  Service: Urology;  Laterality: N/A;  . CYSTOSCOPY WITH LITHOLAPAXY N/A 01/04/2017   Procedure: CYSTOSCOPY WITH LITHOLAPAXY, Holmium Laser;  Surgeon: Ardis Hughs, MD;  Location: [facility];  Service: Urology;  Laterality: N/A;  .  CYSTOSCOPY WITH RETROGRADE PYELOGRAM, URETEROSCOPY AND STENT PLACEMENT  11/03/2012   Procedure: CYSTOSCOPY WITH RETROGRADE PYELOGRAM, URETEROSCOPY AND STENT PLACEMENT;  Surgeon: Bernestine Amass, MD;  Location: [facility];  Service: Urology;  Laterality: Left;  Cystoscopy/Left Retrograde/Ureteroscopy/Holmium Laser Litho/Double J Stent  . HOLMIUM LASER APPLICATION  93/73/4287   Procedure: HOLMIUM LASER APPLICATION;  Surgeon: Bernestine Amass, MD;  Location: [facility];  Service: Urology;  Laterality: Left;  . HOLMIUM LASER APPLICATION N/A 6/81/1572   Procedure: HOLMIUM LASER APPLICATION;  Surgeon: Rana Snare, MD;  Location: [facility];  Service: Urology;  Laterality: N/A;  . KNEE SURGERY Bilateral [place]  . MELANOMA EXCISION Bilateral June 2016   20th-- left leg //  1st-- right leg w/ skin graft from right thigh (no lymph node bx)  . NEPHROLITHOTOMY Right 05/17/2014   Procedure: NEPHROLITHOTOMY PERCUTANEOUS;  Surgeon: Bernestine Amass, MD;  Location: [facility];  Service: Urology;  Laterality: Right;  . PILONIDAL CYST EXCISION    . ROBOTIC ASSITED PARTIAL NEPHRECTOMY Left 06/18/2013   Procedure: ROBOTIC ASSISTED PARTIAL NEPHRECTOMY;  Surgeon: Dutch Gray, MD;  Location: [facility];  Service: Urology;  Laterality: Left;  . ROTATOR CUFF REPAIR Left 07/2016  . TONSILLECTOMY  46  (age 44)  . TRANSURETHRAL RESECTION OF PROSTATE N/A 01/04/2017  Procedure: TRANSURETHRAL RESECTION OF THE PROSTATE (TURP);  Surgeon: Ardis Hughs, MD;  Location: [facility];  Service: Urology;  Laterality: N/A;   Social History   Social History  . Marital status: Married    Spouse name: N/A  . Number of children: 2  . Years of education: 16   Occupational History  . Government social research officer, Psychologist, occupational    Social History Main Topics  . Smoking status: Former Smoker    Packs/day: 1.00    Years: 6.00    Types: Cigarettes    Quit date: 10/31/1969  . Smokeless tobacco:  Never Used  . Alcohol use 0.0 oz/week     Comment: OCCASIONAL  . Drug use: No  . Sexual activity: No   Other Topics Concern  . Not on file   Social History Narrative   Regular exercise-no   Family History  Problem Relation Age of Onset  . Arthritis Father   . Hypertension Father   . Diabetes Father   . Arthritis Mother   . Hypertension Mother   . Heart attack Brother 7  . Diabetes Brother   . Lymphoma Paternal Grandfather   . Cancer Maternal Uncle   . Cancer Paternal Uncle     type unknown   Allergies  Allergen Reactions  . Actos [Pioglitazone] Swelling    edema   Prior to Admission medications   Medication Sig Start Date End Date Taking? Authorizing Provider  acetaminophen (TYLENOL) 500 MG tablet Take 1,000 mg by mouth daily.   Yes Historical Provider, MD  amLODipine (NORVASC) 5 MG tablet Take 5 mg by mouth daily.    Yes Historical Provider, MD  aspirin EC 81 MG tablet Take 81 mg by mouth daily.   Yes Historical Provider, MD  doxazosin (CARDURA) 8 MG tablet Take 8 mg by mouth at bedtime.    Yes Historical Provider, MD  Insulin Glargine (BASAGLAR KWIKPEN) 100 UNIT/ML SOPN Inject 2.4 mLs (240 Units total) into the skin every morning. And pen needles 3/day Patient taking differently: Inject 240 Units into the skin daily. *HOLD FOR BLOOD SUGAR LESS THAN 120* 11/23/16  Yes Renato Shin, MD  metoprolol (LOPRESSOR) 50 MG tablet Take 50 mg by mouth 2 (two) times daily.  11/15/16  Yes Historical Provider, MD  ONE TOUCH ULTRA TEST test strip USE 1 EACH 2 TIMES DAILY                              . 07/13/16  Yes Renato Shin, MD  potassium citrate (UROCIT-K) 10 MEQ (1080 MG) SR tablet Take 2 tablets (20 mEq total) by mouth 2 (two) times daily. Patient taking differently: Take 20 mEq by mouth every evening.  05/18/14  Yes Rana Snare, MD  pravastatin (PRAVACHOL) 40 MG tablet Take 40 mg by mouth every evening.    Yes Historical Provider, MD  valsartan-hydrochlorothiazide (DIOVAN-HCT)  320-25 MG per tablet Take 1 tablet by mouth every evening.    Yes Historical Provider, MD  ciprofloxacin (CIPRO) 500 MG tablet Take 1 tablet (500 mg total) by mouth 2 (two) times daily. 02/06/17   Naiping Ephriam Jenkins, MD  docusate sodium (COLACE) 100 MG capsule Take 1 capsule (100 mg total) by mouth 2 (two) times daily. Patient not taking: Reported on 01/25/2017 01/04/17   Ardis Hughs, MD  traMADol (ULTRAM) 50 MG tablet Take 2 tablets (100 mg total) by mouth every 6 (six) hours as needed for moderate pain.  Patient not taking: Reported on 01/25/2017 01/04/17   Ardis Hughs, MD     Positive ROS: All other systems have been reviewed and were otherwise negative with the exception of those mentioned in the HPI and as above.  Physical Exam: General: Alert, no acute distress Cardiovascular: No pedal edema Respiratory: No cyanosis, no use of accessory musculature GI: abdomen soft Skin: No lesions in the area of chief complaint Neurologic: Sensation intact distally Psychiatric: Patient is competent for consent with normal mood and affect Lymphatic: no lymphedema  MUSCULOSKELETAL: exam stable  Assessment: Left knee osteoarthritis  Plan: Plan for Procedure(s): LEFT TOTAL KNEE ARTHROPLASTY  The risks benefits and alternatives were discussed with the patient including but not limited to the risks of nonoperative treatment, versus surgical intervention including infection, bleeding, nerve injury,  blood clots, cardiopulmonary complications, morbidity, mortality, among others, and they were willing to proceed.   Eduard Roux, MD   02/07/2017 10:42 AM

## 2017-02-07 NOTE — Anesthesia Postprocedure Evaluation (Signed)
Anesthesia Post Note  Patient: Matthew Schmitt  Procedure(s) Performed: Procedure(s) (LRB): LEFT TOTAL KNEE ARTHROPLASTY (Left)  Patient location during evaluation: PACU Anesthesia Type: Spinal and Regional Level of consciousness: awake and alert Pain management: pain level controlled Vital Signs Assessment: post-procedure vital signs reviewed and stable Respiratory status: spontaneous breathing and respiratory function stable Cardiovascular status: blood pressure returned to baseline and stable Postop Assessment: spinal receding Anesthetic complications: no       Last Vitals:  Vitals:   02/07/17 1550 02/07/17 1605  BP: 133/74 131/88  Pulse: 67 69  Resp: 19 12  Temp:      Last Pain:  Vitals:   02/07/17 1615  TempSrc:   PainSc: Asleep                 Luay Balding,W. EDMOND

## 2017-02-07 NOTE — Anesthesia Procedure Notes (Addendum)
Anesthesia Regional Block: Adductor canal block   Pre-Anesthetic Checklist: ,, timeout performed, Correct Patient, Correct Site, Correct Laterality, Correct Procedure, Correct Position, site marked, Risks and benefits discussed,  Surgical consent,  Pre-op evaluation,  At surgeon's request and post-op pain management  Laterality: Lower and Left  Prep: Maximum Sterile Barrier Precautions used, chloraprep       Needles:  Injection technique: Single-shot  Needle Type: Echogenic Stimulator Needle     Needle Length: 10cm      Additional Needles:   Procedures: ultrasound guided, nerve stimulator,,,,,,  Narrative:  Start time: 02/07/2017 12:13 PM End time: 02/07/2017 12:18 PM Injection made incrementally with aspirations every 5 mL.  Performed by: Personally  Anesthesiologist: Montez Hageman  Additional Notes: Patient tolerated the procedure well without complications

## 2017-02-07 NOTE — Op Note (Signed)
Total Knee Arthroplasty Procedure Note Matthew Schmitt [id number] 02/07/2017   Preoperative diagnosis: Left knee osteoarthritis  Postoperative diagnosis:same  Operative procedure: Left total knee arthroplasty. CPT [phone]  Surgeon: N. Eduard Roux, MD  Assistants: Laure Kidney, RNFA  Anesthesia: Spinal, regional  Tourniquet time: less than 80 mins  Implants used: [employer] Femur: 7 Tibia: 7 Patella: 32 mm, 7.5 Polyethylene: 9 mm  Indication: Matthew Schmitt is a 66 y.o. year old male with a history of knee pain. Having failed conservative management, the patient elected to proceed with a total knee arthroplasty.  We have reviewed the risk and benefits of the surgery and they elected to proceed after voicing understanding.  Procedure:  After informed consent was obtained and understanding of the risk were voiced including but not limited to bleeding, infection, damage to surrounding structures including nerves and vessels, blood clots, leg length inequality and the failure to achieve desired results, the operative extremity was marked with verbal confirmation of the patient in the holding area.   The patient was then brought to the operating room and transported to the operating room table in the supine position.  A tourniquet was applied to the operative extremity around the upper thigh. The operative limb was then prepped and draped in the usual sterile fashion and preoperative antibiotics were administered.  A time out was performed prior to the start of surgery confirming the correct extremity, preoperative antibiotic administration, as well as team members, implants and instruments available for the case. Correct surgical site was also confirmed with preoperative radiographs. The limb was then elevated for exsanguination and the tourniquet was inflated. A midline incision was made and a standard medial parapatellar approach was performed.  The patella was prepared and  sized to a 32 mm.  A cover was placed on the patella for protection from retractors.  We then turned our attention to the femur. Posterior cruciate ligament was sacrificed. Start site was drilled in the femur and the intramedullary distal femoral cutting guide was placed, set at 5 degrees valgus, taking 9 mm of distal resection. The distal cut was made. Osteophytes were then removed. Next, the proximal tibial cutting guide was placed with appropriate slope, varus/valgus alignment and depth of resection. The proximal tibial cut was made. Gap blocks were then used to assess the extension gap and alignment, and appropriate soft tissue releases were performed. Attention was turned back to the femur, which was sized using the sizing guide to a size 7. Appropriate rotation of the femoral component was determined using epicondylar axis, Whiteside's line, and assessing the flexion gap under ligament tension. The appropriate size 4-in-1 cutting block was placed and cuts were made. Posterior femoral osteophytes and uncapped bone were then removed with the curved osteotome. The tibia was sized for a size 7 component. The femoral box-cutting guide was placed and prepared for a PS femoral component. Trial components were placed, and stability was checked in full extension, mid-flexion, and deep flexion. Proper tibial rotation was determined and marked.  The patella tracked well without a lateral release. Trial components were then removed and tibial preparation performed. A posterior capsular injection comprising of 20 cc of 1.3% exparel and 40 cc of normal saline was performed for postoperative pain control. The bony surfaces were irrigated with a pulse lavage and then dried. Bone cement was vacuum mixed on the back table, and the final components sized above were cemented into place. After cement had finished curing, excess cement was  removed. The stability of the construct was re-evaluated throughout a range of motion and  found to be acceptable. The trial liner was removed, the knee was copiously irrigated, and the knee was re-evaluated for any excess bone debris. The real polyethylene liner, 9 mm thick, was inserted and checked to ensure the locking mechanism had engaged appropriately. The tourniquet was deflated and hemostasis was achieved. The wound was irrigated with dilute betadine in normal saline, and then again with normal saline. A drain was not placed. Capsular closure was performed with a #1 vicryl, subcutaneous fat closed with a 2.0 vicryl suture, then subcutaneous tissue closed with interrupted 2.0 vicryl suture. The skin was then closed with a nylon. A sterile dressing was applied.   The patient was awakened in the operating room and taken to recovery in stable condition. All sponge, needle, and instrument counts were correct at the end of the case.  Position: supine  Complications: none.  Time Out: performed   Drains/Packing: none  Estimated blood loss: 75 cc  Returned to Recovery Room: in good condition.   Antibiotics: yes   Mechanical VTE (DVT) Prophylaxis: sequential compression devices, TED thigh-high  Chemical VTE (DVT) Prophylaxis: aspirin  Fluid Replacement  Crystalloid: see anesthesia record Blood: none  FFP: none   Specimens Removed: 1 to pathology   Sponge and Instrument Count Correct? yes   PACU: portable radiograph - knee AP and Lateral   Admission: inpatient status  Plan/RTC: Return in 2 weeks for wound check.   Weight Bearing/Load Lower Extremity: full   N. Eduard Roux, MD [place] 2:24 PM

## 2017-02-07 NOTE — Transfer of Care (Signed)
Immediate Anesthesia Transfer of Care Note  Patient: Matthew Schmitt  Procedure(s) Performed: Procedure(s): LEFT TOTAL KNEE ARTHROPLASTY (Left)  Patient Location: PACU  Anesthesia Type:Spinal and MAC combined with regional for post-op pain  Level of Consciousness: sedated  Airway & Oxygen Therapy: Patient Spontanous Breathing  Post-op Assessment: Report given to RN and Post -op Vital signs reviewed and stable  Post vital signs: Reviewed and stable  Last Vitals:  Vitals:   02/07/17 1210 02/07/17 1505  BP: (!) 149/63 (!) 111/58  Pulse: 71 77  Resp: 16 16  Temp:  36.3 C    Last Pain:  Vitals:   02/07/17 1505  TempSrc:   PainSc: (P) Asleep      Patients Stated Pain Goal: 3 (65/99/35 7017)  Complications: No apparent anesthesia complications

## 2017-02-07 NOTE — Anesthesia Procedure Notes (Signed)
Spinal  Patient location during procedure: OR Staffing Anesthesiologist: CARIGNAN, PETER Performed: anesthesiologist  Preanesthetic Checklist Completed: patient identified, site marked, surgical consent, pre-op evaluation, timeout performed, IV checked, risks and benefits discussed and monitors and equipment checked Spinal Block Patient position: sitting Prep: Betadine Patient monitoring: heart rate, continuous pulse ox and blood pressure Approach: midline Location: L3-4 Injection technique: single-shot Needle Needle type: Spinocan  Needle gauge: 22 G Needle length: 9 cm Additional Notes Expiration date of kit checked and confirmed. Patient tolerated procedure well, without complications.       

## 2017-02-07 NOTE — Progress Notes (Signed)
Orthopedic Tech Progress Note Patient Details:  Matthew Schmitt 1951-09-13 [id number]  CPM Left Knee CPM Left Knee: On Left Knee Flexion (Degrees): 90 Left Knee Extension (Degrees): 0 Additional Comments: on at 1530   Braulio Bosch 02/07/2017, 3:35 PM

## 2017-02-08 ENCOUNTER — Encounter (HOSPITAL_COMMUNITY): Payer: Self-pay | Admitting: Orthopaedic Surgery

## 2017-02-08 LAB — CBC
HCT: 37.1 % — ABNORMAL LOW (ref 39.0–52.0)
Hemoglobin: 12.1 g/dL — ABNORMAL LOW (ref 13.0–17.0)
MCH: 26.6 pg (ref 26.0–34.0)
MCHC: 32.6 g/dL (ref 30.0–36.0)
MCV: 81.5 fL (ref 78.0–100.0)
Platelets: 126 10*3/uL — ABNORMAL LOW (ref 150–400)
RBC: 4.55 MIL/uL (ref 4.22–5.81)
RDW: 14 % (ref 11.5–15.5)
WBC: 5.4 10*3/uL (ref 4.0–10.5)

## 2017-02-08 LAB — BASIC METABOLIC PANEL
Anion gap: 9 (ref 5–15)
BUN: 17 mg/dL (ref 6–20)
CO2: 27 mmol/L (ref 22–32)
Calcium: 8.4 mg/dL — ABNORMAL LOW (ref 8.9–10.3)
Chloride: 102 mmol/L (ref 101–111)
Creatinine, Ser: 1.03 mg/dL (ref 0.61–1.24)
GFR calc Af Amer: >60 mL/min (ref >60)
GFR calc non Af Amer: >60 mL/min (ref >60)
Glucose, Bld: 165 mg/dL — ABNORMAL HIGH (ref 65–99)
Potassium: 3.3 mmol/L — ABNORMAL LOW (ref 3.5–5.1)
Sodium: 138 mmol/L (ref 135–145)

## 2017-02-08 LAB — GLUCOSE, CAPILLARY
Glucose-Capillary: 147 mg/dL — ABNORMAL HIGH (ref 65–99)
Glucose-Capillary: 230 mg/dL — ABNORMAL HIGH (ref 65–99)
Glucose-Capillary: 324 mg/dL — ABNORMAL HIGH (ref 65–99)
Glucose-Capillary: 301 mg/dL — ABNORMAL HIGH (ref 65–99)
Glucose-Capillary: 246 mg/dL — ABNORMAL HIGH (ref 65–99)

## 2017-02-08 NOTE — Progress Notes (Signed)
   Subjective:  Patient reports pain as moderate.  No events.  Objective:   VITALS:   Vitals:   02/07/17 1808 02/07/17 2142 02/08/17 0020 02/08/17 0427  BP: (!) 149/68 (!) 141/71 132/67 128/67  Pulse: 64 76 71 72  Resp: 14 16 16 16   Temp: 97.6 F (36.4 C) 98.1 F (36.7 C) 98 F (36.7 C) 98 F (36.7 C)  TempSrc: Oral Oral Oral Oral  SpO2: 98% 94% 94% 93%  Weight:      Height:        Neurologically intact Neurovascular intact Sensation intact distally Intact pulses distally Dorsiflexion/Plantar flexion intact Incision: dressing C/D/I and no drainage No cellulitis present Compartment soft   Lab Results  Component Value Date   WBC 5.4 02/08/2017   HGB 12.1 (L) 02/08/2017   HCT 37.1 (L) 02/08/2017   MCV 81.5 02/08/2017   PLT 126 (L) 02/08/2017     Assessment/Plan:  1 Day Post-Op   - Expected postop acute blood loss anemia - will monitor for symptoms - Up with PT/OT - DVT ppx - SCDs, ambulation, aspirin - WBAT operative extremity - Pain control - Discharge planning - likely home sat - [facility]PT with [facility]  Matthew Schmitt 02/08/2017, 7:25 AM [phone]

## 2017-02-08 NOTE — Evaluation (Signed)
Occupational Therapy Evaluation Patient Details Name: Matthew Schmitt MRN: [id number] DOB: March 11, 1951 Today's Date: 02/08/2017    History of Present Illness Pt is a 66 yo male admitted on 02/07/17 for L TKA. PMH significant for anxiety, CAD, HTN, Heart murmur, and  A-fib.    Clinical Impression   Patient evaluated by Occupational Therapy with no further acute OT needs identified. All education has been completed and the patient has no further questions. See below for any follow-up Occupational Therapy or equipment needs. OT to sign off. Thank you for referral.      Follow Up Recommendations  No OT follow up    Equipment Recommendations  None recommended by OT    Recommendations for Other Services       Precautions / Restrictions Precautions Precautions: Knee Precaution Booklet Issued: Yes (comment) Precaution Comments: educated on full knee extension Restrictions Weight Bearing Restrictions: Yes LLE Weight Bearing: Weight bearing as tolerated      Mobility Bed Mobility Overal bed mobility: Needs Assistance Bed Mobility: Supine to Sit     Supine to sit: Min assist     General bed mobility comments: in chair on arrival  Transfers Overall transfer level: Needs assistance Equipment used: Rolling walker (2 wheeled) Transfers: Sit to/from Stand Sit to Stand: Supervision         General transfer comment: Min guard from recliner with cues for pushing up from recliner    Balance Overall balance assessment: Needs assistance Sitting-balance support: No upper extremity supported;Feet supported Sitting balance-Leahy Scale: Good     Standing balance support: Bilateral upper extremity supported Standing balance-Leahy Scale: Poor Standing balance comment: relies on RW for standing activities                           ADL either performed or assessed with clinical judgement   ADL Overall ADL's : Modified independent                                        General ADL Comments: pt demonstrates shower transfer with unsteady balance and advised to sit at this time. pt able to reach feed so no AE education required. pt educated on ice .    Pt educated on bathing and avoid washing directly on incision. Pt educated to use new wash cloth and towel each day. Pt educated to allow water to run across dressing and not to soak in a tub at this time.      Vision   Vision Assessment?: No apparent visual deficits     Perception     Praxis      Pertinent Vitals/Pain Pain Assessment: 0-10 Pain Score: 4  Pain Location: Left klnee Pain Descriptors / Indicators: Aching;Dull Pain Intervention(s): Monitored during session;Premedicated before session;Repositioned     Hand Dominance Right   Extremity/Trunk Assessment Upper Extremity Assessment Upper Extremity Assessment: Overall WFL for tasks assessed   Lower Extremity Assessment Lower Extremity Assessment: Defer to PT evaluation LLE Deficits / Details: pt with normal post op pain and weakenss. At least 3/5 ankle and 2/5 knee and hip per gross functional assessment   Cervical / Trunk Assessment Cervical / Trunk Assessment: Normal   Communication Communication Communication: No difficulties   Cognition Arousal/Alertness: Awake/alert Behavior During Therapy: WFL for tasks assessed/performed Overall Cognitive Status: Within Functional Limits for tasks assessed  General Comments  intact and dry.     Exercises Exercises: Total Joint Total Joint Exercises Ankle Circles/Pumps: AROM;Both;10 reps;Supine Quad Sets: AROM;Left;10 reps;Supine Short Arc Quad: AROM;Left;10 reps;Supine Heel Slides: AROM;Left;10 reps;Supine Hip ABduction/ADduction: AROM;Left;10 reps;Supine Straight Leg Raises: AROM;Left;Supine;15 reps Goniometric ROM: 0-75   Shoulder Instructions      Home Living Family/patient expects to be discharged to::  Private residence Living Arrangements: Spouse/significant other Available Help at Discharge: Family;Available 24 hours/day Type of Home: House Home Access: Stairs to enter [employer] of Steps: 2 Entrance Stairs-Rails: None Home Layout: Two level;Bed/bath upstairs Alternate Level Stairs-Number of Steps: 15-16 Alternate Level Stairs-Rails: Right Bathroom Shower/Tub: Occupational psychologist: Standard Bathroom Accessibility: Yes   Home Equipment: None          Prior Functioning/Environment Level of Independence: Independent        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        OT Problem List:        OT Treatment/Interventions:      OT Goals(Current goals can be found in the care plan section) Acute Rehab OT Goals Patient Stated Goal: return to playing tennis  OT Frequency:     Barriers to D/C:            Co-evaluation              End of Session Equipment Utilized During Treatment: Gait belt;Rolling walker CPM Left Knee CPM Left Knee: Off Nurse Communication: Mobility status;Precautions  Activity Tolerance: Patient tolerated treatment well Patient left: in chair;with call bell/phone within reach  OT Visit Diagnosis: Unsteadiness on feet (R26.81)                Time: 8502-7741 OT Time Calculation (min): 19 min Charges:  OT General Charges $OT Visit: 1 Procedure OT Evaluation $OT Eval Moderate Complexity: 1 Procedure G-Codes:      Jeri Modena   OTR/L Pager: [phone] Office: [phone] .   Parke Poisson B 02/08/2017, 2:53 PM

## 2017-02-08 NOTE — Care Management Note (Signed)
Case Management Note  Patient Details  Name: Matthew Schmitt MRN: [id number] Date of Birth: January 08, 1951  Subjective/Objective:  66 yr old gentleman s/p Left total knee arthroplasty.                  Action/Plan: Case manager spoke with patient concerning [place] and DME needs at discharge. Patient was preoperatively setup with [facility], no changes. Patient states he will have family support at discharge.   Expected Discharge Date:   02/09/17               Expected Discharge Plan:  [place]  In-House Referral:  NA  Discharge planning Services     Post Acute Care Choice:  Durable Medical Equipment, Home Health Choice offered to:  Patient  DME Arranged:  3-N-1, Walker rolling DME Agency:  [place]:  PT [place] Agency:  [facility] ([facility]  Status of Service:  Completed, signed off  If discussed at [employer], dates discussed:    Additional Comments:  Ninfa Meeker, RN 02/08/2017, 11:26 AM

## 2017-02-08 NOTE — Progress Notes (Addendum)
qPhysical Therapy Treatment Patient Details Name: Matthew Schmitt MRN: [id number] DOB: 03-14-1951 Today's Date: 02/08/2017    History of Present Illness Pt is a 66 yo male admitted on 02/07/17 for L TKA. PMH significant for anxiety, CAD, HTN, Heart murmur, and  A-fib.     PT Comments    Pt continues to be moving well with therapy this PM session. Performed gait x 120' this session and initiated additional supine exercises. Pt with good quad control with SLR and decreased reliance on clinician to perform exercises. Pt is able to perform transfers and gait with decreased assistance this session. Pt will need to perform stair negotiation next session prior to discharge to next venue listed below.     Follow Up Recommendations  Home health PT     Equipment Recommendations  Rolling walker with 5" wheels;3in1 (PT)    Recommendations for Other Services       Precautions / Restrictions Precautions Precautions: Knee Precaution Booklet Issued: Yes (comment) Precaution Comments: Handout given and reviewed no pillow under knee Restrictions Weight Bearing Restrictions: Yes LLE Weight Bearing: Weight bearing as tolerated    Mobility  Bed Mobility Overal bed mobility: Needs Assistance Bed Mobility: Supine to Sit     Supine to sit: Min assist     General bed mobility comments: OOB in recliner when PT arrives  Transfers Overall transfer level: Needs assistance Equipment used: Rolling walker (2 wheeled) Transfers: Sit to/from Stand Sit to Stand: Min guard         General transfer comment: Min guard from recliner with cues for pushing up from recliner  Ambulation/Gait Ambulation/Gait assistance: Min guard Ambulation Distance (Feet): 120 Feet Assistive device: Rolling walker (2 wheeled) Gait Pattern/deviations: Step-to pattern;Decreased step length - right;Decreased stance time - left;Antalgic Gait velocity: decreased Gait velocity interpretation: Below normal speed for  age/gender General Gait Details: Moderate to mild antalgic gait, cues for attempting step through pattern and pt is unable to perform this session.    Stairs            Wheelchair Mobility    Modified Rankin (Stroke Patients Only)       Balance Overall balance assessment: Needs assistance Sitting-balance support: No upper extremity supported;Feet supported Sitting balance-Leahy Scale: Good     Standing balance support: Bilateral upper extremity supported Standing balance-Leahy Scale: Poor Standing balance comment: relies on RW for standing activities                            Cognition Arousal/Alertness: Awake/alert Behavior During Therapy: WFL for tasks assessed/performed Overall Cognitive Status: Within Functional Limits for tasks assessed                                        Exercises Total Joint Exercises Short Arc Quad: AROM;Left;10 reps;Supine Heel Slides: AROM;Left;10 reps;Supine Hip ABduction/ADduction: AROM;Left;10 reps;Supine Straight Leg Raises: AROM;Left;Supine;15 reps Goniometric ROM: 0-75    General Comments        Pertinent Vitals/Pain Pain Assessment: 0-10 Pain Score: 5  Pain Location: Left klnee Pain Descriptors / Indicators: Aching;Dull Pain Intervention(s): Monitored during session;Premedicated before session;Repositioned;Ice applied    Home Living                      Prior Function            PT Goals (  current goals can now be found in the care plan section) Acute Rehab PT Goals Patient Stated Goal: to get home PT Goal Formulation: With patient Time For Goal Achievement: 02/15/17 Potential to Achieve Goals: Good Progress towards PT goals: Progressing toward goals    Frequency    7X/week      PT Plan Current plan remains appropriate    Co-evaluation             End of Session Equipment Utilized During Treatment: Gait belt Activity Tolerance: Patient tolerated treatment  well Patient left: in chair;with call bell/phone within reach Nurse Communication: Mobility status PT Visit Diagnosis: Difficulty in walking, not elsewhere classified (R26.2);Pain;Other (comment) (decreased ROM left knee) Pain - Right/Left: Left Pain - part of body: Knee     Time: 1340-1415 PT Time Calculation (min) (ACUTE ONLY): 35 min  Charges:  $Gait Training: 8-22 mins $Therapeutic Exercise: 8-22 mins                    G Codes:       Scheryl Marten PT, DPT  [phone]    Jacqulyn Liner Sloan Leiter 02/08/2017, 2:20 PM

## 2017-02-08 NOTE — Evaluation (Signed)
Physical Therapy Evaluation Patient Details Name: Matthew Schmitt MRN: [id number] DOB: 1951-09-05 Today's Date: 02/08/2017   History of Present Illness  Pt is a 66 yo male admitted on 02/07/17 for L TKA. PMH significant for anxiety, CAD, HTN, Heart murmur, and  A-fib.   Clinical Impression  Pt is POD 1 and moving well with therapy. Prior to admission, pt lived with his wife in a single level home and was working full time. Pt presents with limitations listed below and requires Min A for a majority of mobility this session. Pt will benefit from continued acute PT services in order to address the below deficits prior to a return to PLOF. Pt will require HHPT at discharge in order to maximize his functional outcomes.     Follow Up Recommendations Home health PT    Equipment Recommendations  Rolling walker with 5" wheels;3in1 (PT)    Recommendations for Other Services       Precautions / Restrictions Precautions Precautions: Knee Precaution Booklet Issued: Yes (comment) Precaution Comments: Handout given and reviewed no pillow under knee Restrictions Weight Bearing Restrictions: Yes LLE Weight Bearing: Weight bearing as tolerated      Mobility  Bed Mobility Overal bed mobility: Needs Assistance Bed Mobility: Supine to Sit     Supine to sit: Min assist     General bed mobility comments: Min A to bring LLE EOB  Transfers Overall transfer level: Needs assistance Equipment used: Rolling walker (2 wheeled) Transfers: Sit to/from Stand Sit to Stand: Min assist         General transfer comment: Min A for safety from EOB to RW  Ambulation/Gait Ambulation/Gait assistance: Min guard;Min assist Ambulation Distance (Feet): 50 Feet Assistive device: Rolling walker (2 wheeled) Gait Pattern/deviations: Step-to pattern;Decreased step length - right;Decreased stance time - left;Antalgic Gait velocity: decreased Gait velocity interpretation: Below normal speed for  age/gender General Gait Details: Moderate antalgic gait, cues for sequencing and to increase step length  Stairs            Wheelchair Mobility    Modified Rankin (Stroke Patients Only)       Balance Overall balance assessment: Needs assistance Sitting-balance support: No upper extremity supported;Feet supported Sitting balance-Leahy Scale: Good     Standing balance support: Bilateral upper extremity supported Standing balance-Leahy Scale: Poor Standing balance comment: relies on RW for standing activities                             Pertinent Vitals/Pain Pain Assessment: 0-10 Pain Score: 4  Pain Location: Left klnee Pain Descriptors / Indicators: Aching;Dull    Home Living Family/patient expects to be discharged to:: Private residence Living Arrangements: Spouse/significant other Available Help at Discharge: Family;Available 24 hours/day Type of Home: House Home Access: Stairs to enter Entrance Stairs-Rails: None Entrance Stairs-Number of Steps: 2 Home Layout: Two level;Bed/bath upstairs Home Equipment: None      Prior Function Level of Independence: Independent         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     Hand Dominance        Extremity/Trunk Assessment   Upper Extremity Assessment Upper Extremity Assessment: Defer to OT evaluation    Lower Extremity Assessment Lower Extremity Assessment: LLE deficits/detail LLE Deficits / Details: pt with normal post op pain and weakenss. At least 3/5 ankle and 2/5 knee and hip per gross functional assessment    Cervical / Trunk Assessment  Cervical / Trunk Assessment: Normal  Communication   Communication: No difficulties  Cognition Arousal/Alertness: Awake/alert Behavior During Therapy: WFL for tasks assessed/performed Overall Cognitive Status: Within Functional Limits for tasks assessed                                        General  Comments      Exercises Total Joint Exercises Ankle Circles/Pumps: AROM;Both;10 reps;Supine Quad Sets: AROM;Left;10 reps;Supine Heel Slides: AROM;Left;10 reps;Supine Goniometric ROM: 0-75   Assessment/Plan    PT Assessment Patient needs continued PT services  PT Problem List Decreased strength;Decreased range of motion;Decreased activity tolerance;Decreased balance;Decreased mobility;Decreased knowledge of use of DME;Pain       PT Treatment Interventions DME instruction;Gait training;Stair training;Functional mobility training;Therapeutic activities;Therapeutic exercise;Balance training;Patient/family education    PT Goals (Current goals can be found in the Care Plan section)  Acute Rehab PT Goals Patient Stated Goal: to get home PT Goal Formulation: With patient Time For Goal Achievement: 02/15/17 Potential to Achieve Goals: Good    Frequency 7X/week   Barriers to discharge        Co-evaluation               End of Session Equipment Utilized During Treatment: Gait belt Activity Tolerance: Patient tolerated treatment well Patient left: in chair;with call bell/phone within reach Nurse Communication: Mobility status PT Visit Diagnosis: Difficulty in walking, not elsewhere classified (R26.2);Pain;Other (comment) (decreased ROM left knee) Pain - Right/Left: Left Pain - part of body: Knee    Time: 2229-7989 PT Time Calculation (min) (ACUTE ONLY): 32 min   Charges:   PT Evaluation $PT Eval Moderate Complexity: 1 Procedure PT Treatments $Gait Training: 8-22 mins   PT G Codes:        Scheryl Marten PT, DPT  [phone]   Shanon Rosser 02/08/2017, 11:28 AM

## 2017-02-08 NOTE — Discharge Instructions (Signed)
INSTRUCTIONS AFTER JOINT REPLACEMENT   o Remove items at home which could result in a fall. This includes throw rugs or furniture in walking pathways o ICE to the affected joint every three hours while awake for 30 minutes at a time, for at least the first 3-5 days, and then as needed for pain and swelling.  Continue to use ice for pain and swelling. You may notice swelling that will progress down to the foot and ankle.  This is normal after surgery.  Elevate your leg when you are not up walking on it.   o Continue to use the breathing machine you got in the hospital (incentive spirometer) which will help keep your temperature down.  It is common for your temperature to cycle up and down following surgery, especially at night when you are not up moving around and exerting yourself.  The breathing machine keeps your lungs expanded and your temperature down.   DIET:  As you were doing prior to hospitalization, we recommend a well-balanced diet.  DRESSING / WOUND CARE / SHOWERING  You may change your surgical dressing 2 days after surgery.  Then change the dressing every day with ABD pad.  Please use good hand washing techniques before changing the dressing.  Do not use any lotions or creams on the incision until instructed by your surgeon.  You may not shower until instructed.  ACTIVITY  o Increase activity slowly as tolerated, but follow the weight bearing instructions below.   o No driving for 6 weeks or until further direction given by your physician.  You cannot drive while taking narcotics.  o No lifting or carrying greater than 10 lbs. until further directed by your surgeon. o Avoid periods of inactivity such as sitting longer than an hour when not asleep. This helps prevent blood clots.  o You may return to work once you are authorized by your doctor.     WEIGHT BEARING   Weight bearing as tolerated with assist device (walker, cane, etc) as directed, use it as long as suggested by your  surgeon or therapist, typically at least 4-6 weeks.   EXERCISES  Results after joint replacement surgery are often greatly improved when you follow the exercise, range of motion and muscle strengthening exercises prescribed by your doctor. Safety measures are also important to protect the joint from further injury. Any time any of these exercises cause you to have increased pain or swelling, decrease what you are doing until you are comfortable again and then slowly increase them. If you have problems or questions, call your caregiver or physical therapist for advice.   Rehabilitation is important following a joint replacement. After just a few days of immobilization, the muscles of the leg can become weakened and shrink (atrophy).  These exercises are designed to build up the tone and strength of the thigh and leg muscles and to improve motion. Often times heat used for twenty to thirty minutes before working out will loosen up your tissues and help with improving the range of motion but do not use heat for the first two weeks following surgery (sometimes heat can increase post-operative swelling).   These exercises can be done on a training (exercise) mat, on the floor, on a table or on a bed. Use whatever works the best and is most comfortable for you.    Use music or television while you are exercising so that the exercises are a pleasant break in your day. This will make your life  better with the exercises acting as a break in your routine that you can look forward to.   Perform all exercises about fifteen times, three times per day or as directed.  You should exercise both the operative leg and the other leg as well. ° °Exercises include: °  °• Quad Sets - Tighten up the muscle on the front of the thigh (Quad) and hold for 5-10 seconds.   °• Straight Leg Raises - With your knee straight (if you were given a brace, keep it on), lift the leg to 60 degrees, hold for 3 seconds, and slowly lower the leg.   Perform this exercise against resistance later as your leg gets stronger.  °• Leg Slides: Lying on your back, slowly slide your foot toward your buttocks, bending your knee up off the floor (only go as far as is comfortable). Then slowly slide your foot back down until your leg is flat on the floor again.  °• Angel Wings: Lying on your back spread your legs to the side as far apart as you can without causing discomfort.  °• Hamstring Strength:  Lying on your back, push your heel against the floor with your leg straight by tightening up the muscles of your buttocks.  Repeat, but this time bend your knee to a comfortable angle, and push your heel against the floor.  You may put a pillow under the heel to make it more comfortable if necessary.  ° °A rehabilitation program following joint replacement surgery can speed recovery and prevent re-injury in the future due to weakened muscles. Contact your doctor or a physical therapist for more information on knee rehabilitation.  ° ° °CONSTIPATION ° °Constipation is defined medically as fewer than three stools per week and severe constipation as less than one stool per week.  Even if you have a regular bowel pattern at home, your normal regimen is likely to be disrupted due to multiple reasons following surgery.  Combination of anesthesia, postoperative narcotics, change in appetite and fluid intake all can affect your bowels.  ° °YOU MUST use at least one of the following options; they are listed in order of increasing strength to get the job done.  They are all available over the counter, and you may need to use some, POSSIBLY even all of these options:   ° °Drink plenty of fluids (prune juice may be helpful) and high fiber foods °Colace 100 mg by mouth twice a day  °Senokot for constipation as directed and as needed Dulcolax (bisacodyl), take with full glass of water  °Miralax (polyethylene glycol) once or twice a day as needed. ° °If you have tried all these things and  are unable to have a bowel movement in the first 3-4 days after surgery call either your surgeon or your primary doctor.   ° °If you experience loose stools or diarrhea, hold the medications until you stool forms back up.  If your symptoms do not get better within 1 week or if they get worse, check with your doctor.  If you experience "the worst abdominal pain ever" or develop nausea or vomiting, please contact the office immediately for further recommendations for treatment. ° ° °ITCHING:  If you experience itching with your medications, try taking only a single pain pill, or even half a pain pill at a time.  You can also use Benadryl over the counter for itching or also to help with sleep.  ° °TED HOSE STOCKINGS:  Use stockings on both legs   until for at least 2 weeks or as directed by physician office. They may be removed at night for sleeping.  MEDICATIONS:  See your medication summary on the After Visit Summary that nursing will review with you.  You may have some home medications which will be placed on hold until you complete the course of blood thinner medication.  It is important for you to complete the blood thinner medication as prescribed.  PRECAUTIONS:  If you experience chest pain or shortness of breath - call 911 immediately for transfer to the hospital emergency department.   If you develop a fever greater that 101 F, purulent drainage from wound, increased redness or drainage from wound, foul odor from the wound/dressing, or calf pain - CONTACT YOUR SURGEON.                                                   FOLLOW-UP APPOINTMENTS:  If you do not already have a post-op appointment, please call the office for an appointment to be seen by your surgeon.  Guidelines for how soon to be seen are listed in your After Visit Summary, but are typically between 1-4 weeks after surgery.  OTHER INSTRUCTIONS:   Knee Replacement:  Do not place pillow under knee, focus on keeping the knee straight  while resting. CPM instructions: 0-90 degrees, 2 hours in the morning, 2 hours in the afternoon, and 2 hours in the evening. Place foam block, curve side up under heel at all times except when in CPM or when walking.  DO NOT modify, tear, cut, or change the foam block in any way.  MAKE SURE YOU:   Understand these instructions.   Get help right away if you are not doing well or get worse.    Thank you for letting us be a part of your medical care team.  It is a privilege we respect greatly.  We hope these instructions will help you stay on track for a fast and full recovery!

## 2017-02-09 LAB — CBC
HCT: 37.9 % — ABNORMAL LOW (ref 39.0–52.0)
Hemoglobin: 12.5 g/dL — ABNORMAL LOW (ref 13.0–17.0)
MCH: 26.4 pg (ref 26.0–34.0)
MCHC: 33 g/dL (ref 30.0–36.0)
MCV: 80.1 fL (ref 78.0–100.0)
Platelets: 144 10*3/uL — ABNORMAL LOW (ref 150–400)
RBC: 4.73 MIL/uL (ref 4.22–5.81)
RDW: 13.9 % (ref 11.5–15.5)
WBC: 10.4 10*3/uL (ref 4.0–10.5)

## 2017-02-09 LAB — GLUCOSE, CAPILLARY
Glucose-Capillary: 251 mg/dL — ABNORMAL HIGH (ref 65–99)
Glucose-Capillary: 221 mg/dL — ABNORMAL HIGH (ref 65–99)

## 2017-02-09 NOTE — Progress Notes (Signed)
Subjective: 2 Days Post-Op Procedure(s) (LRB): LEFT TOTAL KNEE ARTHROPLASTY (Left) Patient reports pain as mild and moderate.    Objective: Vital signs in last 24 hours: Temp:  [98.2 F (36.8 C)-98.7 F (37.1 C)] 98.2 F (36.8 C) (03/24 0623) Pulse Rate:  [68-76] 70 (03/24 1000) Resp:  [16] 16 (03/23 1324) BP: (143-165)/(50-69) 147/55 (03/24 1000) SpO2:  [92 %-96 %] 96 % (03/24 0623)  Intake/Output from previous day: 03/23 0701 - 03/24 0700 In: 1200 [P.O.:1200] Out: 1700 [Urine:1700] Intake/Output this shift: Total I/O In: 240 [P.O.:240] Out: -    Recent Labs  02/08/17 0242 02/09/17 0548  HGB 12.1* 12.5*    Recent Labs  02/08/17 0242 02/09/17 0548  WBC 5.4 10.4  RBC 4.55 4.73  HCT 37.1* 37.9*  PLT 126* 144*    Recent Labs  02/08/17 0242  NA 138  K 3.3*  CL 102  CO2 27  BUN 17  CREATININE 1.03  GLUCOSE 165*  CALCIUM 8.4*   No results for input(s): LABPT, INR in the last 72 hours.  Neurologically intact Sensation intact distally Incision: no drainage  Assessment/Plan: 2 Days Post-Op Procedure(s) (LRB): LEFT TOTAL KNEE ARTHROPLASTY (Left) Advance diet Up with therapy Discharge home with home health  Biagio Borg 02/09/2017, 11:00 AM

## 2017-02-09 NOTE — Progress Notes (Signed)
qPhysical Therapy Treatment Patient Details Name: Matthew Schmitt MRN: [id number] DOB: 03-17-1951 Today's Date: 02/09/2017    History of Present Illness Pt is a 66 yo male admitted on 02/07/17 for L TKA. PMH significant for anxiety, CAD, HTN, Heart murmur, and  A-fib.     PT Comments    Pt is POD 2 and continues to be moving well with therapy. Pt is able to increased gait distance and speed this session with improved sequencing. Pt continues to be limited with good step through sequencing due to increased pain in right knee as well. Performed stair negotiation with good adherence to proper sequencing. Pt is ready for DC once cleared by MD.     Follow Up Recommendations  Home health PT     Equipment Recommendations  Rolling walker with 5" wheels;3in1 (PT)    Recommendations for Other Services       Precautions / Restrictions Precautions Precautions: Knee Precaution Booklet Issued: Yes (comment) Precaution Comments: educated on full knee extension Restrictions Weight Bearing Restrictions: Yes LLE Weight Bearing: Weight bearing as tolerated    Mobility  Bed Mobility Overal bed mobility: Modified Independent Bed Mobility: Supine to Sit     Supine to sit: Modified independent (Device/Increase time)     General bed mobility comments: Able to get EOB without assistance this AM  Transfers Overall transfer level: Needs assistance Equipment used: Rolling walker (2 wheeled) Transfers: Sit to/from Stand Sit to Stand: Supervision         General transfer comment: Supervision for safety  Ambulation/Gait Ambulation/Gait assistance: Supervision;Min guard Ambulation Distance (Feet): 150 Feet Assistive device: Rolling walker (2 wheeled) Gait Pattern/deviations: Step-to pattern;Decreased step length - right;Decreased stance time - left;Antalgic Gait velocity: decreased Gait velocity interpretation: Below normal speed for age/gender General Gait Details: Pt continues to have  mild-moderate antalgic gait. Improved sequencing, but unable to perform mobility without step to sequencing due to increased pain in Right knee as well.    Stairs Stairs: Yes   Stair Management: One rail Right;Step to pattern;Forwards Number of Stairs: 5 General stair comments: cues for sequencing, instructed on forward and sideways.   Wheelchair Mobility    Modified Rankin (Stroke Patients Only)       Balance                                            Cognition Arousal/Alertness: Awake/alert Behavior During Therapy: WFL for tasks assessed/performed Overall Cognitive Status: Within Functional Limits for tasks assessed                                        Exercises Total Joint Exercises Long Arc Quad: AROM;Left;10 reps;Seated Knee Flexion: AROM;Left;10 reps;Seated Goniometric ROM: 0-90    General Comments        Pertinent Vitals/Pain Pain Assessment: 0-10 Pain Score: 3  Pain Location: Left klnee Pain Descriptors / Indicators: Aching;Dull Pain Intervention(s): Monitored during session;Premedicated before session;Ice applied    Home Living                      Prior Function            PT Goals (current goals can now be found in the care plan section) Acute Rehab PT Goals Patient Stated Goal: return to  playing tennis Progress towards PT goals: Progressing toward goals    Frequency    7X/week      PT Plan Current plan remains appropriate    Co-evaluation             End of Session Equipment Utilized During Treatment: Gait belt Activity Tolerance: Patient tolerated treatment well Patient left: in chair;with call bell/phone within reach Nurse Communication: Mobility status;Other (comment) (Pt ready for Discharge once cleared by MD) PT Visit Diagnosis: Difficulty in walking, not elsewhere classified (R26.2);Pain;Other (comment) (decreased ROM left Knee) Pain - Right/Left: Left Pain - part of body:  Knee     Time: 5427-0623 PT Time Calculation (min) (ACUTE ONLY): 35 min  Charges:  $Gait Training: 8-22 mins $Therapeutic Exercise: 8-22 mins                    G Codes:       Scheryl Marten PT, DPT  [phone]    Jacqulyn Liner Sloan Leiter 02/09/2017, 8:55 AM

## 2017-02-12 ENCOUNTER — Encounter (INDEPENDENT_AMBULATORY_CARE_PROVIDER_SITE_OTHER): Payer: Self-pay | Admitting: Orthopaedic Surgery

## 2017-02-12 ENCOUNTER — Ambulatory Visit (INDEPENDENT_AMBULATORY_CARE_PROVIDER_SITE_OTHER): Payer: Self-pay | Admitting: Orthopaedic Surgery

## 2017-02-12 DIAGNOSIS — M1712 Unilateral primary osteoarthritis, left knee: Secondary | ICD-10-CM

## 2017-02-12 NOTE — Progress Notes (Signed)
Patient is 5 days status post left total knee replacement presenting for his first visit. He is doing well. He is on 10 days of Bactrim. The incision is clean dry and intact without any signs of infection. Calves nontender. The waterproof dressing was changed today to a dry dressing. I taught him how to do Betadine paints with dry dressing. Continue with home physical therapy. I'll like to see him back in about 2 weeks for suture removal. Questions encouraged and answered.

## 2017-02-14 NOTE — Discharge Summary (Signed)
Physician Discharge Summary      Patient ID: Matthew Schmitt MRN: [id number] DOB/AGE: Jun 06, 1951 66 y.o.  Admit date: 02/07/2017 Discharge date: 02/14/2017  Admission Diagnoses:  <principal problem not specified>  Discharge Diagnoses:  Active Problems:   Primary osteoarthritis of left knee   Total knee replacement status   Past Medical History:  Diagnosis Date  . Anxiety   . Arthritis    everywhere  . Bladder calculus   . Coronary artery disease    cardiologist-  dr Wynonia Lawman  . Essential hypertension, benign   . First degree heart block   . Heart murmur   . History of kidney stones   . History of melanoma excision    June 2016  --- s/p  excision bilateral leg   . Impotence of organic origin   . Lower urinary tract symptoms (LUTS)   . Melanoma of lower leg, right ([place]) 2014  . Mixed hyperlipidemia    slightly  . Multiple renal cysts    bilateral  . PAF (paroxysmal atrial fibrillation) ([place])   . Presence of surgical incision    x2  bilateral leg and right thigh  . RBBB   . Renal cell carcinoma (HCC)    s/p  left partial nephrectomy  . Sleep apnea    no CPAP  . Type 2 diabetes mellitus ([place])    type1  . Wears glasses     Surgeries: Procedure(s): LEFT TOTAL KNEE ARTHROPLASTY on 02/07/2017   Consultants (if any):   Discharged Condition: Improved  Hospital Course: Matthew Schmitt is an 66 y.o. male who was admitted 02/07/2017 with a diagnosis of <principal problem not specified> and went to the operating room on 02/07/2017 and underwent the above named procedures.    He was given perioperative antibiotics:  Anti-infectives    Start     Dose/Rate Route Frequency Ordered Stop   02/07/17 2200  sulfamethoxazole-trimethoprim (BACTRIM DS,SEPTRA DS) 800-160 MG per tablet 1 tablet  Status:  Discontinued     1 tablet Oral Every 12 hours 02/07/17 1803 02/09/17 1428   02/07/17 2000  ciprofloxacin (CIPRO) tablet 500 mg  Status:  Discontinued     500 mg Oral 2 times daily  02/07/17 1803 02/08/17 0727   02/07/17 1900  ceFAZolin (ANCEF) IVPB 2g/100 mL premix     2 g 200 mL/hr over 30 Minutes Intravenous Every 6 hours 02/07/17 1803 02/08/17 0214   02/07/17 1044  ceFAZolin (ANCEF) IVPB 2g/100 mL premix     2 g 200 mL/hr over 30 Minutes Intravenous On call to O.R. 02/07/17 1044 02/07/17 1230   02/07/17 0000  sulfamethoxazole-trimethoprim (BACTRIM DS,SEPTRA DS) 800-160 MG tablet     1 tablet Oral 2 times daily 02/07/17 1429      .  He was given sequential compression devices, early ambulation, and aspirin for DVT prophylaxis.  He benefited maximally from the hospital stay and there were no complications.    Recent vital signs:  Vitals:   02/09/17 0623 02/09/17 1000  BP: (!) 143/50 (!) 147/55  Pulse: 68 70  Resp:    Temp: 98.2 F (36.8 C)     Recent laboratory studies:  Lab Results  Component Value Date   HGB 12.5 (L) 02/09/2017   HGB 12.1 (L) 02/08/2017   HGB 14.9 02/01/2017   Lab Results  Component Value Date   WBC 10.4 02/09/2017   PLT 144 (L) 02/09/2017   Lab Results  Component Value Date   INR 1.03 02/01/2017  Lab Results  Component Value Date   NA 138 02/08/2017   K 3.3 (L) 02/08/2017   CL 102 02/08/2017   CO2 27 02/08/2017   BUN 17 02/08/2017   CREATININE 1.03 02/08/2017   GLUCOSE 165 (H) 02/08/2017    Discharge Medications:   Allergies as of 02/09/2017      Reactions   Actos [pioglitazone] Swelling   edema      Medication List    STOP taking these medications   ciprofloxacin 500 MG tablet Commonly known as:  CIPRO   traMADol 50 MG tablet Commonly known as:  ULTRAM     TAKE these medications   acetaminophen 500 MG tablet Commonly known as:  TYLENOL Take 1,000 mg by mouth daily.   amLODipine 5 MG tablet Commonly known as:  NORVASC Take 5 mg by mouth daily.   aspirin EC 325 MG tablet Take 1 tablet (325 mg total) by mouth 2 (two) times daily. What changed:  medication strength  how much to take  when  to take this   BASAGLAR KWIKPEN 100 UNIT/ML Sopn Inject 2.4 mLs (240 Units total) into the skin every morning. And pen needles 3/day What changed:  when to take this  additional instructions   docusate sodium 100 MG capsule Commonly known as:  COLACE Take 1 capsule (100 mg total) by mouth 2 (two) times daily.   doxazosin 8 MG tablet Commonly known as:  CARDURA Take 8 mg by mouth at bedtime.   methocarbamol 750 MG tablet Commonly known as:  ROBAXIN Take 1 tablet (750 mg total) by mouth 2 (two) times daily as needed for muscle spasms.   metoprolol 50 MG tablet Commonly known as:  LOPRESSOR Take 50 mg by mouth 2 (two) times daily.   ondansetron 4 MG tablet Commonly known as:  ZOFRAN Take 1-2 tablets (4-8 mg total) by mouth every 8 (eight) hours as needed for nausea or vomiting.   ONE TOUCH ULTRA TEST test strip Generic drug:  glucose blood USE 1 EACH 2 TIMES DAILY                              .   oxyCODONE 10 mg 12 hr tablet Commonly known as:  OXYCONTIN Take 1 tablet (10 mg total) by mouth every 12 (twelve) hours. Notes to patient:  Every 12 hours   oxyCODONE 5 MG immediate release tablet Commonly known as:  Oxy IR/ROXICODONE Take 1-3 tablets (5-15 mg total) by mouth every 4 (four) hours as needed.   potassium citrate 10 MEQ (1080 MG) SR tablet Commonly known as:  UROCIT-K Take 2 tablets (20 mEq total) by mouth 2 (two) times daily. What changed:  when to take this   pravastatin 40 MG tablet Commonly known as:  PRAVACHOL Take 40 mg by mouth every evening.   promethazine 25 MG tablet Commonly known as:  PHENERGAN Take 1 tablet (25 mg total) by mouth every 6 (six) hours as needed for nausea.   sulfamethoxazole-trimethoprim 800-160 MG tablet Commonly known as:  BACTRIM DS,SEPTRA DS Take 1 tablet by mouth 2 (two) times daily.   valsartan-hydrochlorothiazide 320-25 MG tablet Commonly known as:  DIOVAN-HCT Take 1 tablet by mouth every evening.        Diagnostic Studies: Dg Knee Left Port  Result Date: 02/07/2017 CLINICAL DATA:  Status post total knee replacement EXAM: PORTABLE LEFT KNEE - 1-2 VIEW COMPARISON:  None. FINDINGS: Frontal and lateral views were  obtained. The patient is status post total knee replacement with femoral and tibial prosthetic components well-seated. No fracture or dislocation. Air within the joint is an expected postoperative finding. There is spurring along the anterior patella. IMPRESSION: Femoral and tibial prosthetic components well-seated. No acute fracture or dislocation. Electronically Signed   By: Lowella Grip III M.D.   On: 02/07/2017 15:24    Disposition: 01-Home or Self Care  Discharge Instructions    CPM    Complete by:  As directed    Continuous passive motion machine (CPM):      Use the CPM from 0 to 60 degrees for 6-8 hours per day.      You may increase by 5-10 per day.  You may break it up into 2 or 3 sessions per day.      Use CPM for 3-4 weeks or until you are told to stop.   Call MD / Call 911    Complete by:  As directed    If you experience chest pain or shortness of breath, CALL 911 and be transported to the hospital emergency room.  If you develope a fever above 101 F, pus (white drainage) or increased drainage or redness at the wound, or calf pain, call your surgeon's office.   Constipation Prevention    Complete by:  As directed    Drink plenty of fluids.  Prune juice may be helpful.  You may use a stool softener, such as Colace (over the counter) 100 mg twice a day.  Use MiraLax (over the counter) for constipation as needed.   Diet general    Complete by:  As directed    Discharge instructions    Complete by:  As directed    [place] items at home which could result in a fall. This includes throw rugs or furniture in walking pathways ICE to the affected joint every three hours while awake for 30 minutes at a time, for at least the first  3-5 days, and then as needed for pain and swelling.  Continue to use ice for pain and swelling. You may notice swelling that will progress down to the foot and ankle.  This is normal after surgery.  Elevate your leg when you are not up walking on it.   Continue to use the breathing machine you got in the hospital (incentive spirometer) which will help keep your temperature down.  It is common for your temperature to cycle up and down following surgery, especially at night when you are not up moving around and exerting yourself.  The breathing machine keeps your lungs expanded and your temperature down.   DIET:  As you were doing prior to hospitalization, we recommend a well-balanced diet.  DRESSING / WOUND CARE / SHOWERING  Keep the surgical dressing until follow up.  The dressing is water proof, so you can shower without any extra covering.  IF THE DRESSING FALLS OFF or the wound gets wet inside, change the dressing with sterile gauze.  Please use good hand washing techniques before changing the dressing.  Do not use any lotions or creams on the incision until instructed by your surgeon.    ACTIVITY  Increase activity slowly as tolerated, but follow the weight bearing instructions below.   No driving for 6 weeks or until further direction given by your physician.  You cannot drive while taking narcotics.  No lifting or carrying greater than 10 lbs. until further directed by your  Psychologist, sport and exercise. Avoid periods of inactivity such as sitting longer than an hour when not asleep. This helps prevent blood clots.  You may return to work once you are authorized by your doctor.     WEIGHT BEARING   weight bearing as tolerated with assist device as directed.     EXERCISES  Results after joint replacement surgery are often greatly improved when you follow the exercise, range of motion and muscle strengthening exercises prescribed by your doctor. Safety measures are also important to protect the joint from  further injury. Any time any of these exercises cause you to have increased pain or swelling, decrease what you are doing until you are comfortable again and then slowly increase them. If you have problems or questions, call your caregiver or physical therapist for advice.   Rehabilitation is important following a joint replacement. After just a few days of immobilization, the muscles of the leg can become weakened and shrink (atrophy).  These exercises are designed to build up the tone and strength of the thigh and leg muscles and to improve motion. Often times heat used for twenty to thirty minutes before working out will loosen up your tissues and help with improving the range of motion but do not use heat for the first two weeks following surgery (sometimes heat can increase post-operative swelling).   These exercises can be done on a training (exercise) mat,  on a table or on a bed. Use whatever works the best and is most comfortable for you.    Use music or television while you are exercising so that the exercises are a pleasant break in your day. This will make your life better with the exercises acting as a break in your routine that you can look forward to.   Perform all exercises about fifteen times, three times per day or as directed.  You should exercise both the operative leg and the other leg as well.   Exercises include:  Quad Sets - Tighten up the muscle on the front of the thigh (Quad) and hold for 5-10 seconds.   Straight Leg Raises - With your knee straight (if you were given a brace, keep it on), lift the leg to 60 degrees, hold for 3 seconds, and slowly lower the leg.  Perform this exercise against resistance later as your leg gets stronger.  Leg Slides: Lying on your back, slowly slide your foot toward your buttocks, bending your knee up off the floor (only go as far as is comfortable). Then slowly slide your foot back down until your leg is flat on the floor again.  Angel Wings:  Lying on your back spread your legs to the side as far apart as you can without causing discomfort.  Hamstring Strength:  Lying on your back, push your heel against the floor with your leg straight by tightening up the muscles of your buttocks.  Repeat, but this time bend your knee to a comfortable angle, and push your heel against the floor.  You may put a pillow under the heel to make it more comfortable if necessary.   A rehabilitation program following joint replacement surgery can speed recovery and prevent re-injury in the future due to weakened muscles. Contact your doctor or a physical therapist for more information on knee rehabilitation.    CONSTIPATION  Constipation is defined medically as fewer than three stools per week and severe constipation as less than one stool per week.  Even if you have a regular bowel  pattern at home, your normal regimen is likely to be disrupted due to multiple reasons following surgery.  Combination of anesthesia, postoperative narcotics, change in appetite and fluid intake all can affect your bowels.   YOU MUST use at least one of the following options; they are listed in order of increasing strength to get the job done.  They are all available over the counter, and you may need to use some, POSSIBLY even all of these options:    Drink plenty of fluids (prune juice may be helpful) and high fiber foods Colace 100 mg by mouth twice a day  Senokot for constipation as directed and as needed Dulcolax (bisacodyl), take with full glass of water  Miralax (polyethylene glycol) once or twice a day as needed.  If you have tried all these things and are unable to have a bowel movement in the first 3-4 days after surgery call either your surgeon or your primary doctor.    If you experience loose stools or diarrhea, hold the medications until you stool forms back up.  If your symptoms do not get better within 1 week or if they get worse, check with your doctor.  If you  experience "the worst abdominal pain ever" or develop nausea or vomiting, please contact the office immediately for further recommendations for treatment.   ITCHING:  If you experience itching with your medications, try taking only a single pain pill, or even half a pain pill at a time.  You can also use Benadryl over the counter for itching or also to help with sleep.   TED HOSE STOCKINGS:  Use stockings on both legs until for at least 2 weeks or as directed by physician office. They may be removed at night for sleeping.  MEDICATIONS:  See your medication summary on the "After Visit Summary" that nursing will review with you.  You may have some home medications which will be placed on hold until you complete the course of blood thinner medication.  It is important for you to complete the blood thinner medication as prescribed.  PRECAUTIONS:  If you experience chest pain or shortness of breath - call 911 immediately for transfer to the hospital emergency department.   If you develop a fever greater that 101 F, purulent drainage from wound, increased redness or drainage from wound, foul odor from the wound/dressing, or calf pain - CONTACT YOUR SURGEON.                                                   FOLLOW-UP APPOINTMENTS:  If you do not already have a post-op appointment, please call the office for an appointment to be seen by your surgeon.  Guidelines for how soon to be seen are listed in your "After Visit Summary", but are typically between 1-4 weeks after surgery.  OTHER INSTRUCTIONS:   Knee Replacement:  Do not place pillow under knee, focus on keeping the knee straight while resting. CPM instructions: 0-90 degrees, 2 hours in the morning, 2 hours in the afternoon, and 2 hours in the evening. Place foam block, curve side up under heel at all times except when in CPM or when walking.  DO NOT modify, tear, cut, or change the foam block in any way.  MAKE SURE YOU:  Understand these  instructions.  Get help right away if you  are not doing well or get worse.    Thank you for letting us be a part of your medical care team.  It is a privilege we respect greatly.  We hope these instructions will help you stay on track for a fast and full recovery!   Do not put a pillow under the knee. Place it under the heel.    Complete by:  As directed    Driving restrictions    Complete by:  As directed    No driving for 6 weeks   Increase activity slowly as tolerated    Complete by:  As directed    Lifting restrictions    Complete by:  As directed    No lifting for 6 weeks   Patient may shower    Complete by:  As directed    You may shower over your dressing   Weight bearing as tolerated    Complete by:  As directed       Follow-up Information    Eduard Roux, MD Follow up in 1 week(s).   Specialty:  Orthopedic Surgery Why:  For wound re-check Contact information: [place] [address] [facility].   Specialty:  [place] Why:  A representative from [facility] will contact you to arrange start date and time for your therapy.  Contact information: [address] [place] [address] [phone]            Signed: Eduard Roux 02/14/2017, 2:21 PM

## 2017-02-25 ENCOUNTER — Inpatient Hospital Stay (INDEPENDENT_AMBULATORY_CARE_PROVIDER_SITE_OTHER): Payer: Managed Care, Other (non HMO) | Admitting: Orthopaedic Surgery

## 2017-02-26 ENCOUNTER — Ambulatory Visit (INDEPENDENT_AMBULATORY_CARE_PROVIDER_SITE_OTHER): Payer: Self-pay | Admitting: Orthopaedic Surgery

## 2017-02-26 DIAGNOSIS — M1712 Unilateral primary osteoarthritis, left knee: Secondary | ICD-10-CM

## 2017-02-26 MED ORDER — OXYCODONE HCL 5 MG PO TABS: 5.0000 mg | ORAL_TABLET | Freq: Four times a day (QID) | ORAL | 0 refills | Status: DC | PRN

## 2017-02-26 NOTE — Progress Notes (Signed)
Patient is approximately 2 weeks status post left total knee replacement. He has had an uneventful postoperative course. He is doing well. Taking occasional oxycodone. He has cleared home health physical therapy. His surgical incision has healed without any signs of infection. His range of motion is 0 to about 100. He is ambulating with a walking stick. Patient is doing very well from my standpoint. I refilled his oxycodone which she takes sparingly. At this point I'll like him to do outpatient physical therapy which she will do [place]. Continue aspirin twice daily for DVT prophylaxis. Increase activity as tolerated. Follow-up with me in 4 weeks with 2 view x-rays of the left knee.

## 2017-03-20 ENCOUNTER — Other Ambulatory Visit (INDEPENDENT_AMBULATORY_CARE_PROVIDER_SITE_OTHER): Payer: Self-pay | Admitting: Orthopaedic Surgery

## 2017-03-26 ENCOUNTER — Ambulatory Visit (INDEPENDENT_AMBULATORY_CARE_PROVIDER_SITE_OTHER): Payer: Managed Care, Other (non HMO) | Admitting: Orthopaedic Surgery

## 2017-03-29 ENCOUNTER — Ambulatory Visit (INDEPENDENT_AMBULATORY_CARE_PROVIDER_SITE_OTHER): Payer: Managed Care, Other (non HMO)

## 2017-03-29 ENCOUNTER — Ambulatory Visit (INDEPENDENT_AMBULATORY_CARE_PROVIDER_SITE_OTHER): Payer: Managed Care, Other (non HMO) | Admitting: Orthopaedic Surgery

## 2017-03-29 ENCOUNTER — Encounter (INDEPENDENT_AMBULATORY_CARE_PROVIDER_SITE_OTHER): Payer: Self-pay | Admitting: Orthopaedic Surgery

## 2017-03-29 DIAGNOSIS — M1712 Unilateral primary osteoarthritis, left knee: Secondary | ICD-10-CM | POA: Diagnosis not present

## 2017-03-29 NOTE — Progress Notes (Signed)
Patient is a 66 year old gentleman who is 7 weeks status post left total knee replacement. He is doing well. He denies any pain. He is doing outpatient physical therapy. His range of motion 0-125. He is interested in having a right total knee replacement in June. He's not had any issues. He is very satisfied. His x-ray show stable alignment. From my standpoint he is doing well he can stop taking aspirin. He will give [place] a call to schedule the right knee replacement.

## 2017-04-11 ENCOUNTER — Other Ambulatory Visit (INDEPENDENT_AMBULATORY_CARE_PROVIDER_SITE_OTHER): Payer: Self-pay | Admitting: Orthopaedic Surgery

## 2017-04-11 DIAGNOSIS — M1711 Unilateral primary osteoarthritis, right knee: Secondary | ICD-10-CM

## 2017-04-16 ENCOUNTER — Other Ambulatory Visit (INDEPENDENT_AMBULATORY_CARE_PROVIDER_SITE_OTHER): Payer: Self-pay | Admitting: Orthopaedic Surgery

## 2017-04-18 NOTE — Pre-Procedure Instructions (Signed)
Matthew Schmitt  04/18/2017      [id number] - Matthew Schmitt, [address] [address] Phone: [phone] Fax: [place], [place] [address] Phone: [phone] Fax: [phone]    Your procedure is scheduled on June 7  Report to [facility] at [phone] A.M.  Call this number if you have problems the morning of surgery:  [phone]   Remember:  Do not eat food or drink liquids after midnight.  Take these medicines the morning of surgery with A SIP OF WATER Metoprolol (Lopressor)  Take/ Stop aspirin as directed by your Dr.  Stop taking BC's, Goody's, Herbal medications, Fish Oil, Ibuprofen, Advil, Motrin, Aleve, Vitamins, Bayer back and Body    How to Manage Your Diabetes Before and After Surgery  Why is it important to control my blood sugar before and after surgery? . Improving blood sugar levels before and after surgery helps healing and can limit problems. . A way of improving blood sugar control is eating a healthy diet by: o  Eating less sugar and carbohydrates o  Increasing activity/exercise o  Talking with your doctor about reaching your blood sugar goals . High blood sugars (greater than 180 mg/dL) can raise your risk of infections and slow your recovery, so you will need to focus on controlling your diabetes during the weeks before surgery. . Make sure that the doctor who takes care of your diabetes knows about your planned surgery including the date and location.  How do I manage my blood sugar before surgery? . Check your blood sugar at least 4 times a day, starting 2 days before surgery, to make sure that the level is not too high or low. o Check your blood sugar the morning of your surgery when you wake up and every 2 hours until you get to the Short Stay unit. . If your blood sugar is  less than 70 mg/dL, you will need to treat for low blood sugar: o Do not take insulin. o Treat a low blood sugar (less than 70 mg/dL) with  cup of clear juice (cranberry or apple), 4 glucose tablets, OR glucose gel. o Recheck blood sugar in 15 minutes after treatment (to make sure it is greater than 70 mg/dL). If your blood sugar is not greater than 70 mg/dL on recheck, call [phone] for further instructions. . Report your blood sugar to the short stay nurse when you get to Short Stay.  . If you are admitted to the hospital after surgery: o Your blood sugar will be checked by the staff and you will probably be given insulin after surgery (instead of oral diabetes medicines) to make sure you have good blood sugar levels. o The goal for blood sugar control after surgery is 80-180 mg/dL.     WHAT DO I DO ABOUT MY DIABETES MEDICATION?   Marland Kitchen Do not take oral diabetes medicines (pills) the morning of surgery.  . THE NIGHT BEFORE SURGERY, take ___________ units of ___________insulin.       Marland Kitchen HE MORNING OF SURGERY, take _____________ units of __________insulin.  . The day of surgery, do not take other diabetes injectables, including Byetta (exenatide), Bydureon (exenatide ER), Victoza (liraglutide), or Trulicity (dulaglutide).  . If your CBG is greater than 220 mg/dL, you may take  of your sliding scale (  correction) dose of insulin.  Other Instructions:          Patient Signature:  Date:   Nurse Signature:  Date:   Reviewed and Endorsed by [facility] Patient Education Committee, August 2015  Do not wear jewelry, make-up or nail polish.  Do not wear lotions, powders, or perfumes, or deoderant.  Do not shave 48 hours prior to surgery.  Men may shave face and neck.  Do not bring valuables to the hospital.  [facility] is not responsible for any belongings or valuables.  Contacts, dentures or bridgework may not be worn into surgery.  Leave your suitcase in the car.  After surgery  it may be brought to your room.  For patients admitted to the hospital, discharge time will be determined by your treatment team.  Patients discharged the day of surgery will not be allowed to drive home.    Special instructions:   - Preparing for Surgery  Before surgery, you can play an important role.  Because skin is not sterile, your skin needs to be as free of germs as possible.  You can reduce the number of germs on you skin by washing with CHG (chlorahexidine gluconate) soap before surgery.  CHG is an antiseptic cleaner which kills germs and bonds with the skin to continue killing germs even after washing.  Please DO NOT use if you have an allergy to CHG or antibacterial soaps.  If your skin becomes reddened/irritated stop using the CHG and inform your nurse when you arrive at Short Stay.  Do not shave (including legs and underarms) for at least 48 hours prior to the first CHG shower.  You may shave your face.  Please follow these instructions carefully:   1.  Shower with CHG Soap the night before surgery and the    morning of Surgery.  2.  If you choose to wash your hair, wash your hair first as usual with your  normal shampoo.  3.  After you shampoo, rinse your hair and body thoroughly to remove the  Shampoo.  4.  Use CHG as you would any other liquid soap.  You can apply chg directly to the skin and wash gently with scrungie or a clean washcloth.  5.  Apply the CHG Soap to your body ONLY FROM THE NECK DOWN.   Do not use on open wounds or open sores.  Avoid contact with your eyes, ears, mouth and genitals (private parts).  Wash genitals (private parts)  with your normal soap.  6.  Wash thoroughly, paying special attention to the area where your surgery   will be performed.  7.  Thoroughly rinse your body with warm water from the neck down.  8.  DO NOT shower/wash with your normal soap after using and rinsing off  the CHG Soap.  9.  Pat yourself dry with a clean towel.             10.  Wear clean pajamas.            11.  Place clean sheets on your bed the night of your first shower and do not sleep with pets.  Day of Surgery  Do not apply any lotions/deoderants the morning of surgery.  Please wear clean clothes to the hospital/surgery center.     Please read over the following fact sheets that you were given. Pain Booklet, Coughing and Deep Breathing, MRSA Information and Surgical Site Infection Prevention, Incentive spirometry

## 2017-04-19 ENCOUNTER — Encounter (HOSPITAL_COMMUNITY): Payer: Self-pay

## 2017-04-19 ENCOUNTER — Encounter (HOSPITAL_COMMUNITY)
Admission: RE | Admit: 2017-04-19 | Discharge: 2017-04-19 | Disposition: A | Discharge status: Home / Self Care | Payer: Managed Care, Other (non HMO) | Source: Ambulatory Visit | Attending: Orthopaedic Surgery | Admitting: Orthopaedic Surgery

## 2017-04-19 DIAGNOSIS — M1711 Unilateral primary osteoarthritis, right knee: Secondary | ICD-10-CM | POA: Diagnosis not present

## 2017-04-19 DIAGNOSIS — Z01818 Encounter for other preprocedural examination: Secondary | ICD-10-CM | POA: Insufficient documentation

## 2017-04-19 LAB — COMPREHENSIVE METABOLIC PANEL
ALT: 28 U/L (ref 17–63)
AST: 26 U/L (ref 15–41)
Albumin: 3.9 g/dL (ref 3.5–5.0)
Alkaline Phosphatase: 79 U/L (ref 38–126)
Anion gap: 6 (ref 5–15)
BUN: 15 mg/dL (ref 6–20)
CO2: 26 mmol/L (ref 22–32)
Calcium: 9 mg/dL (ref 8.9–10.3)
Chloride: 105 mmol/L (ref 101–111)
Creatinine, Ser: 0.86 mg/dL (ref 0.61–1.24)
GFR calc Af Amer: >60 mL/min (ref >60)
GFR calc non Af Amer: >60 mL/min (ref >60)
Glucose, Bld: 153 mg/dL — ABNORMAL HIGH (ref 65–99)
Potassium: 3.8 mmol/L (ref 3.5–5.1)
Sodium: 137 mmol/L (ref 135–145)
Total Bilirubin: 0.8 mg/dL (ref 0.3–1.2)
Total Protein: 6 g/dL — ABNORMAL LOW (ref 6.5–8.1)

## 2017-04-19 LAB — CBC WITH DIFFERENTIAL/PLATELET
Basophils Absolute: 0 10*3/uL (ref 0.0–0.1)
Basophils Relative: 1 %
Eosinophils Absolute: 0.1 10*3/uL (ref 0.0–0.7)
Eosinophils Relative: 3 %
HCT: 43.5 % (ref 39.0–52.0)
Hemoglobin: 14.3 g/dL (ref 13.0–17.0)
Lymphocytes Relative: 24 %
Lymphs Abs: 1.1 10*3/uL (ref 0.7–4.0)
MCH: 26.7 pg (ref 26.0–34.0)
MCHC: 32.9 g/dL (ref 30.0–36.0)
MCV: 81.2 fL (ref 78.0–100.0)
Monocytes Absolute: 0.3 10*3/uL (ref 0.1–1.0)
Monocytes Relative: 7 %
Neutro Abs: 2.9 10*3/uL (ref 1.7–7.7)
Neutrophils Relative %: 65 %
Platelets: 158 10*3/uL (ref 150–400)
RBC: 5.36 MIL/uL (ref 4.22–5.81)
RDW: 13.9 % (ref 11.5–15.5)
WBC: 4.4 10*3/uL (ref 4.0–10.5)

## 2017-04-19 LAB — PROTIME-INR
INR: 1.07
Prothrombin Time: 13.9 seconds (ref 11.4–15.2)

## 2017-04-19 LAB — URINALYSIS, ROUTINE W REFLEX MICROSCOPIC
Bilirubin Urine: NEGATIVE
Glucose, UA: NEGATIVE mg/dL
Hgb urine dipstick: NEGATIVE
Ketones, ur: NEGATIVE mg/dL
Nitrite: NEGATIVE
Protein, ur: NEGATIVE mg/dL
Specific Gravity, Urine: 1.017 (ref 1.005–1.030)
pH: 5 (ref 5.0–8.0)

## 2017-04-19 LAB — TYPE AND SCREEN
ABO/RH(D): A POS
Antibody Screen: NEGATIVE

## 2017-04-19 LAB — SURGICAL PCR SCREEN
MRSA, PCR: NEGATIVE
Staphylococcus aureus: NEGATIVE

## 2017-04-19 LAB — C-REACTIVE PROTEIN: CRP: <0.8 mg/dL (ref <1.0)

## 2017-04-19 LAB — GLUCOSE, CAPILLARY: Glucose-Capillary: 137 mg/dL — ABNORMAL HIGH (ref 65–99)

## 2017-04-19 LAB — SEDIMENTATION RATE: Sed Rate: 4 mm/hr (ref 0–16)

## 2017-04-19 LAB — APTT: aPTT: 31 seconds (ref 24–36)

## 2017-04-19 NOTE — Pre-Procedure Instructions (Signed)
CIEL YANES  04/19/2017      [id number] - [name], [place] [address] Phone: [phone] Fax: [place], [place] [address] Phone: [phone] Fax: [phone]    Your procedure is scheduled on June 7  Report to [place] at [phone] A.M.  Call this number if you have problems the morning of surgery:  [phone]   Remember:  Do not eat food or drink liquids after midnight.  Take these medicines the morning of surgery with A SIP OF WATER Metoprolol (Lopressor)  Take/ Stop aspirin as directed by your Dr.  Stop taking BC's, Goody's, Herbal medications, Fish Oil, Ibuprofen, Advil, Motrin, Aleve, Vitamins, Bayer back and Body    How to Manage Your Diabetes Before and After Surgery  Why is it important to control my blood sugar before and after surgery? . Improving blood sugar levels before and after surgery helps healing and can limit problems. . A way of improving blood sugar control is eating a healthy diet by: o  Eating less sugar and carbohydrates o  Increasing activity/exercise o  Talking with your doctor about reaching your blood sugar goals . High blood sugars (greater than 180 mg/dL) can raise your risk of infections and slow your recovery, so you will need to focus on controlling your diabetes during the weeks before surgery. . Make sure that the doctor who takes care of your diabetes knows about your planned surgery including the date and location.  How do I manage my blood sugar before surgery? . Check your blood sugar at least 4 times a day, starting 2 days before surgery, to make sure that the level is not too high or low. o Check your blood sugar the morning of your surgery when you wake up and every 2 hours until you get to the Short Stay unit. . If your blood sugar is  less than 70 mg/dL, you will need to treat for low blood sugar: o Do not take insulin. o Treat a low blood sugar (less than 70 mg/dL) with  cup of clear juice (cranberry or apple), 4 glucose tablets, OR glucose gel. o Recheck blood sugar in 15 minutes after treatment (to make sure it is greater than 70 mg/dL). If your blood sugar is not greater than 70 mg/dL on recheck, call [phone] for further instructions. . Report your blood sugar to the short stay nurse when you get to Short Stay.  . If you are admitted to the hospital after surgery: o Your blood sugar will be checked by the staff and you will probably be given insulin after surgery (instead of oral diabetes medicines) to make sure you have good blood sugar levels. o The goal for blood sugar control after surgery is 80-180 mg/dL.     WHAT DO I DO ABOUT MY DIABETES MEDICATION?   Marland Kitchen Do not take oral diabetes medicines (pills) the morning of surgery.  . THE MORNING OF SURGERY, take _____________ units of __________insulin. (HALF OF DOSE)  . The day of surgery, do not take other diabetes injectables, including Byetta (exenatide), Bydureon (exenatide ER), Victoza (liraglutide), or Trulicity (dulaglutide).  . If your CBG is greater than 220 mg/dL, you may take  of your sliding scale (correction) dose of insulin.  O  Do not wear jewelry, make-up or  nail polish.  Do not wear lotions, powders, or perfumes, or deoderant.  Do not shave 48 hours prior to surgery.  Men may shave face and neck.  Do not bring valuables to the hospital.  [facility] is not responsible for any belongings or valuables.  Contacts, dentures or bridgework may not be worn into surgery.  Leave your suitcase in the car.  After surgery it may be brought to your room.  For patients admitted to the hospital, discharge time will be determined by your treatment team.  Patients discharged the day of surgery will not be allowed to drive home.    Special instructions:   [facility] - Preparing for Surgery  Before surgery, you can play an important role.  Because skin is not sterile, your skin needs to be as free of germs as possible.  You can reduce the number of germs on you skin by washing with CHG (chlorahexidine gluconate) soap before surgery.  CHG is an antiseptic cleaner which kills germs and bonds with the skin to continue killing germs even after washing.  Please DO NOT use if you have an allergy to CHG or antibacterial soaps.  If your skin becomes reddened/irritated stop using the CHG and inform your nurse when you arrive at Short Stay.  Do not shave (including legs and underarms) for at least 48 hours prior to the first CHG shower.  You may shave your face.  Please follow these instructions carefully:   1.  Shower with CHG Soap the night before surgery and the    morning of Surgery.  2.  If you choose to wash your hair, wash your hair first as usual with your  normal shampoo.  3.  After you shampoo, rinse your hair and body thoroughly to remove the  Shampoo.  4.  Use CHG as you would any other liquid soap.  You can apply chg directly to the skin and wash gently with scrungie or a clean washcloth.  5.  Apply the CHG Soap to your body ONLY FROM THE NECK DOWN.   Do not use on open wounds or open sores.  Avoid contact with your eyes, ears, mouth and genitals (private parts).  Wash genitals (private parts)  with your normal soap.  6.  Wash thoroughly, paying special attention to the area where your surgery   will be performed.  7.  Thoroughly rinse your body with warm water from the neck down.  8.  DO NOT shower/wash with your normal soap after using and rinsing off  the CHG Soap.  9.  Pat yourself dry with a clean towel.            10.  Wear clean pajamas.            11.  Place clean sheets on your bed the night of your first shower and do not sleep with pets.  Day of Surgery  Do not apply any lotions/deoderants the morning of surgery.  Please wear  clean clothes to the hospital/surgery center.     Please read over the following fact sheets that you were given. Pain Booklet, Coughing and Deep Breathing, MRSA Information and Surgical Site Infection Prevention, Incentive spirometry

## 2017-04-19 NOTE — Progress Notes (Signed)
PCP - [facility], currently searching for new PCP Cardiologist - Dr. Wynonia Lawman Endocrinologist- Dr. Loanne Drilling  EKG - 12/28/16 Stress Test - 01/05/16 ECHO - 2014 Cardiac Cath - 08/17/11  Sleep Study - 12/30/13 CPAP - NO  Fasting Blood Sugars at home- 120-180. Spoke with diabetic coordinator as patient is on 240 unites of insuline glargine daily. Patient instructed to take half of dose the AM of surgery.   Patient denies shortness of breath, fever, cough and chest pain at PAT appointment  Patient verbalized understanding of instructions that were given to them at the PAT appointment. Patient was also instructed that they will need to review over the PAT instructions again at home before surgery.

## 2017-04-20 LAB — HEMOGLOBIN A1C
Hgb A1c MFr Bld: 6.8 % — ABNORMAL HIGH (ref 4.8–5.6)
Mean Plasma Glucose: 148 mg/dL

## 2017-04-22 ENCOUNTER — Other Ambulatory Visit (INDEPENDENT_AMBULATORY_CARE_PROVIDER_SITE_OTHER): Payer: Self-pay | Admitting: Orthopaedic Surgery

## 2017-04-22 NOTE — Progress Notes (Signed)
Anesthesia Chart Review:  Pt is a 66 year old male scheduled for R total knee arthroplasty on 04/25/2017 with Frankey Shown, MD  - Receives primary care at [facility] in [place] is W. Tollie Eth, MD who cleared pt for surgery at last office visit 12/11/16 - Endocrinologist is Renato Shin, MD, last office visit 01/25/17  PMH includes:  CAD, PAF, heart murmur, HTN, DM, sleep apnea, hyperlipidemia, melanoma, renal cell carcinoma (s/p L partial nephrectomy 06/18/13). Former smoker. BMI 36. S/p L TKA 02/07/17. S/p cystoscopy 01/04/17, 05/18/15, 11/03/12.   Medications include: Amlodipine, ASA 81 mg, doxazosin, basaglar, metoprolol, potassium, pravastatin, valsartan-HCTZ.  Preoperative labs reviewed.   - HbA1c 6.8, Glucose 153   EKG 12/28/16: NSR with sinus arrhythmia. LAD. RBBB. LVH with repolarization abnormality Consider Lateral ischemia.   Nuclear stress tests 01/05/16 (Dr. Thurman Coyer office): 1. Normal Lexiscan Myoview scan with no evidence of ischemia or infarction. 2. Normal quantitative gated SPECT EF 50% with normal wall motion and wall thickening.  Echo 06/03/13 (Dr. Thurman Coyer office): 1. Mildly dilated LV with moderate LVH. EF 60% 2. Moderate aortic regurgitation. 3. Mild thickened aortic valve area difficult to tell bicuspid or not. 4. Moderate LA enlargement 5. Mild aortic root enlargement  Cardiac cath 08/17/11:  1. Possible moderate (60%) LAD stenosis just after thebifurcation of a large diagonal branch. 2. Abnormal left ventricular function with an ejection fraction of 40-45% mild global hypokinesis.  Pt tolerated L TKA 02/07/17 without issue. If no changes, I anticipate pt can proceed with surgery as scheduled.   Willeen Cass, FNP-BC [facility] Short Stay Surgical Center/Anesthesiology Phone: [phone] 04/22/2017 2:01 PM

## 2017-04-24 MED ORDER — CEFAZOLIN SODIUM-DEXTROSE 2-4 GM/100ML-% IV SOLN
2.0000 g | INTRAVENOUS | Status: AC
  Administered 2017-04-25: 2 g via INTRAVENOUS
  Filled 2017-04-24: qty 100

## 2017-04-24 MED ORDER — TRANEXAMIC ACID 1000 MG/10ML IV SOLN
1000.0000 mg | INTRAVENOUS | Status: AC
  Administered 2017-04-25: 1000 mg via INTRAVENOUS
  Filled 2017-04-24: qty 10

## 2017-04-24 MED ORDER — BUPIVACAINE LIPOSOME 1.3 % IJ SUSP
20.0000 mL | INTRAMUSCULAR | Status: AC
  Administered 2017-04-25: 20 mL
  Filled 2017-04-24: qty 20

## 2017-04-25 ENCOUNTER — Inpatient Hospital Stay (HOSPITAL_COMMUNITY): Payer: Managed Care, Other (non HMO) | Admitting: Certified Registered"

## 2017-04-25 ENCOUNTER — Encounter (HOSPITAL_COMMUNITY)
Admission: RE | Disposition: A | Discharge status: Home / Self Care | Payer: Self-pay | Source: Ambulatory Visit | Attending: Orthopaedic Surgery

## 2017-04-25 ENCOUNTER — Inpatient Hospital Stay (HOSPITAL_COMMUNITY): Payer: Managed Care, Other (non HMO) | Admitting: Emergency Medicine

## 2017-04-25 ENCOUNTER — Inpatient Hospital Stay (HOSPITAL_COMMUNITY)
Admission: RE | Admit: 2017-04-25 | Discharge: 2017-04-27 | DRG: 470 | Disposition: A | Discharge status: Home / Self Care | Payer: Managed Care, Other (non HMO) | Source: Ambulatory Visit | Attending: Orthopaedic Surgery | Admitting: Orthopaedic Surgery

## 2017-04-25 ENCOUNTER — Encounter (HOSPITAL_COMMUNITY): Payer: Self-pay | Admitting: *Deleted

## 2017-04-25 ENCOUNTER — Inpatient Hospital Stay (HOSPITAL_COMMUNITY): Payer: Managed Care, Other (non HMO)

## 2017-04-25 DIAGNOSIS — Z87891 Personal history of nicotine dependence: Secondary | ICD-10-CM | POA: Diagnosis not present

## 2017-04-25 DIAGNOSIS — Z888 Allergy status to other drugs, medicaments and biological substances status: Secondary | ICD-10-CM | POA: Diagnosis not present

## 2017-04-25 DIAGNOSIS — I2510 Atherosclerotic heart disease of native coronary artery without angina pectoris: Secondary | ICD-10-CM | POA: Diagnosis present

## 2017-04-25 DIAGNOSIS — Z8249 Family history of ischemic heart disease and other diseases of the circulatory system: Secondary | ICD-10-CM | POA: Diagnosis not present

## 2017-04-25 DIAGNOSIS — F419 Anxiety disorder, unspecified: Secondary | ICD-10-CM | POA: Diagnosis present

## 2017-04-25 DIAGNOSIS — X58XXXA Exposure to other specified factors, initial encounter: Secondary | ICD-10-CM | POA: Diagnosis present

## 2017-04-25 DIAGNOSIS — Z79899 Other long term (current) drug therapy: Secondary | ICD-10-CM | POA: Diagnosis not present

## 2017-04-25 DIAGNOSIS — Z85820 Personal history of malignant melanoma of skin: Secondary | ICD-10-CM

## 2017-04-25 DIAGNOSIS — Z96652 Presence of left artificial knee joint: Secondary | ICD-10-CM | POA: Diagnosis present

## 2017-04-25 DIAGNOSIS — S80819A Abrasion, unspecified lower leg, initial encounter: Secondary | ICD-10-CM | POA: Diagnosis present

## 2017-04-25 DIAGNOSIS — I509 Heart failure, unspecified: Secondary | ICD-10-CM | POA: Diagnosis present

## 2017-04-25 DIAGNOSIS — Z85528 Personal history of other malignant neoplasm of kidney: Secondary | ICD-10-CM

## 2017-04-25 DIAGNOSIS — D62 Acute posthemorrhagic anemia: Secondary | ICD-10-CM | POA: Diagnosis not present

## 2017-04-25 DIAGNOSIS — Z96653 Presence of artificial knee joint, bilateral: Secondary | ICD-10-CM

## 2017-04-25 DIAGNOSIS — G4730 Sleep apnea, unspecified: Secondary | ICD-10-CM | POA: Diagnosis present

## 2017-04-25 DIAGNOSIS — I480 Paroxysmal atrial fibrillation: Secondary | ICD-10-CM | POA: Diagnosis present

## 2017-04-25 DIAGNOSIS — Z905 Acquired absence of kidney: Secondary | ICD-10-CM

## 2017-04-25 DIAGNOSIS — Z794 Long term (current) use of insulin: Secondary | ICD-10-CM | POA: Diagnosis not present

## 2017-04-25 DIAGNOSIS — M1711 Unilateral primary osteoarthritis, right knee: Secondary | ICD-10-CM | POA: Diagnosis not present

## 2017-04-25 DIAGNOSIS — Z96659 Presence of unspecified artificial knee joint: Secondary | ICD-10-CM

## 2017-04-25 DIAGNOSIS — Z833 Family history of diabetes mellitus: Secondary | ICD-10-CM

## 2017-04-25 DIAGNOSIS — E782 Mixed hyperlipidemia: Secondary | ICD-10-CM | POA: Diagnosis present

## 2017-04-25 DIAGNOSIS — E119 Type 2 diabetes mellitus without complications: Secondary | ICD-10-CM | POA: Diagnosis present

## 2017-04-25 DIAGNOSIS — I4510 Unspecified right bundle-branch block: Secondary | ICD-10-CM | POA: Diagnosis present

## 2017-04-25 DIAGNOSIS — E0821 Diabetes mellitus due to underlying condition with diabetic nephropathy: Secondary | ICD-10-CM

## 2017-04-25 DIAGNOSIS — Z8261 Family history of arthritis: Secondary | ICD-10-CM

## 2017-04-25 DIAGNOSIS — I110 Hypertensive heart disease with heart failure: Secondary | ICD-10-CM | POA: Diagnosis present

## 2017-04-25 HISTORY — PX: TOTAL KNEE ARTHROPLASTY: SHX125

## 2017-04-25 LAB — GLUCOSE, CAPILLARY
Glucose-Capillary: 113 mg/dL — ABNORMAL HIGH (ref 65–99)
Glucose-Capillary: 81 mg/dL (ref 65–99)
Glucose-Capillary: 146 mg/dL — ABNORMAL HIGH (ref 65–99)

## 2017-04-25 SURGERY — ARTHROPLASTY, KNEE, TOTAL
Anesthesia: Spinal | Site: Knee | Laterality: Right

## 2017-04-25 MED ORDER — METHOCARBAMOL 750 MG PO TABS: 750.0000 mg | ORAL_TABLET | Freq: Two times a day (BID) | ORAL | 0 refills | Status: DC | PRN

## 2017-04-25 MED ORDER — HYDROMORPHONE HCL 1 MG/ML IJ SOLN
0.2500 mg | INTRAMUSCULAR | Status: DC | PRN
  Administered 2017-04-25: 0.5 mg via INTRAVENOUS

## 2017-04-25 MED ORDER — FENTANYL CITRATE (PF) 100 MCG/2ML IJ SOLN
INTRAMUSCULAR | Status: AC
  Administered 2017-04-25: 50 ug via INTRAVENOUS
  Filled 2017-04-25: qty 2

## 2017-04-25 MED ORDER — OXYCODONE HCL 5 MG PO TABS
ORAL_TABLET | ORAL | Status: AC
  Administered 2017-04-25: 5 mg via ORAL
  Filled 2017-04-25: qty 1

## 2017-04-25 MED ORDER — ACETAMINOPHEN 650 MG RE SUPP: 650.0000 mg | Freq: Four times a day (QID) | RECTAL | Status: DC | PRN

## 2017-04-25 MED ORDER — METOCLOPRAMIDE HCL 5 MG/ML IJ SOLN: 5.0000 mg | Freq: Three times a day (TID) | INTRAMUSCULAR | Status: DC | PRN

## 2017-04-25 MED ORDER — KETOROLAC TROMETHAMINE 15 MG/ML IJ SOLN
30.0000 mg | Freq: Four times a day (QID) | INTRAMUSCULAR | Status: AC
  Administered 2017-04-25 – 2017-04-26 (×4): 30 mg via INTRAVENOUS
  Filled 2017-04-25 (×4): qty 2

## 2017-04-25 MED ORDER — MIDAZOLAM HCL 2 MG/2ML IJ SOLN
INTRAMUSCULAR | Status: AC
  Filled 2017-04-25: qty 2

## 2017-04-25 MED ORDER — AMLODIPINE BESYLATE 5 MG PO TABS
5.0000 mg | ORAL_TABLET | Freq: Every evening | ORAL | Status: DC
  Administered 2017-04-25 – 2017-04-26 (×2): 5 mg via ORAL
  Filled 2017-04-25 (×2): qty 1

## 2017-04-25 MED ORDER — PHENOL 1.4 % MT LIQD: 1.0000 | OROMUCOSAL | Status: DC | PRN

## 2017-04-25 MED ORDER — CHLORHEXIDINE GLUCONATE 4 % EX LIQD: 60.0000 mL | Freq: Once | CUTANEOUS | Status: DC

## 2017-04-25 MED ORDER — MIDAZOLAM HCL 5 MG/ML IJ SOLN: 2.0000 mg | Freq: Once | INTRAMUSCULAR | Status: DC

## 2017-04-25 MED ORDER — SODIUM CHLORIDE 0.9 % IV SOLN
INTRAVENOUS | Status: DC
  Administered 2017-04-25: 17:00:00 via INTRAVENOUS

## 2017-04-25 MED ORDER — PROPOFOL 10 MG/ML IV BOLUS
INTRAVENOUS | Status: AC
  Filled 2017-04-25: qty 20

## 2017-04-25 MED ORDER — SORBITOL 70 % SOLN: 30.0000 mL | Freq: Every day | Status: DC | PRN

## 2017-04-25 MED ORDER — TRANEXAMIC ACID 1000 MG/10ML IV SOLN
2000.0000 mg | INTRAVENOUS | Status: DC
  Filled 2017-04-25: qty 20

## 2017-04-25 MED ORDER — OXYCODONE HCL ER 15 MG PO T12A
15.0000 mg | EXTENDED_RELEASE_TABLET | Freq: Two times a day (BID) | ORAL | Status: DC
  Administered 2017-04-25 – 2017-04-27 (×4): 15 mg via ORAL
  Filled 2017-04-25 (×4): qty 1

## 2017-04-25 MED ORDER — PROMETHAZINE HCL 25 MG/ML IJ SOLN: 6.2500 mg | INTRAMUSCULAR | Status: DC | PRN

## 2017-04-25 MED ORDER — ONDANSETRON HCL 4 MG PO TABS: 4.0000 mg | ORAL_TABLET | Freq: Three times a day (TID) | ORAL | 0 refills | Status: DC | PRN

## 2017-04-25 MED ORDER — DOXAZOSIN MESYLATE 8 MG PO TABS
8.0000 mg | ORAL_TABLET | Freq: Every day | ORAL | Status: DC
  Administered 2017-04-25 – 2017-04-26 (×2): 8 mg via ORAL
  Filled 2017-04-25 (×2): qty 1

## 2017-04-25 MED ORDER — HYDROMORPHONE HCL 1 MG/ML IJ SOLN
INTRAMUSCULAR | Status: AC
  Filled 2017-04-25: qty 0.5

## 2017-04-25 MED ORDER — IRBESARTAN 300 MG PO TABS
300.0000 mg | ORAL_TABLET | Freq: Every day | ORAL | Status: DC
  Administered 2017-04-25 – 2017-04-26 (×2): 300 mg via ORAL
  Filled 2017-04-25 (×2): qty 1

## 2017-04-25 MED ORDER — ONDANSETRON HCL 4 MG/2ML IJ SOLN: 4.0000 mg | Freq: Four times a day (QID) | INTRAMUSCULAR | Status: DC | PRN

## 2017-04-25 MED ORDER — PRAVASTATIN SODIUM 40 MG PO TABS
40.0000 mg | ORAL_TABLET | Freq: Every evening | ORAL | Status: DC
  Administered 2017-04-25 – 2017-04-26 (×2): 40 mg via ORAL
  Filled 2017-04-25 (×2): qty 1

## 2017-04-25 MED ORDER — OXYCODONE HCL 5 MG PO TABS
5.0000 mg | ORAL_TABLET | ORAL | Status: DC | PRN
  Administered 2017-04-25: 5 mg via ORAL
  Administered 2017-04-25 – 2017-04-27 (×5): 10 mg via ORAL
  Filled 2017-04-25 (×2): qty 2
  Filled 2017-04-25: qty 3
  Filled 2017-04-25: qty 2
  Filled 2017-04-25: qty 3
  Filled 2017-04-25: qty 2

## 2017-04-25 MED ORDER — ONDANSETRON HCL 4 MG PO TABS: 4.0000 mg | ORAL_TABLET | Freq: Four times a day (QID) | ORAL | Status: DC | PRN

## 2017-04-25 MED ORDER — CEFAZOLIN SODIUM-DEXTROSE 2-4 GM/100ML-% IV SOLN
2.0000 g | Freq: Four times a day (QID) | INTRAVENOUS | Status: AC
  Administered 2017-04-25 – 2017-04-26 (×2): 2 g via INTRAVENOUS
  Filled 2017-04-25 (×2): qty 100

## 2017-04-25 MED ORDER — PROPOFOL 500 MG/50ML IV EMUL
INTRAVENOUS | Status: DC | PRN
  Administered 2017-04-25: 80 ug/kg/min via INTRAVENOUS

## 2017-04-25 MED ORDER — METHOCARBAMOL 500 MG PO TABS
500.0000 mg | ORAL_TABLET | Freq: Four times a day (QID) | ORAL | Status: DC | PRN
  Administered 2017-04-27: 500 mg via ORAL
  Filled 2017-04-25 (×2): qty 1

## 2017-04-25 MED ORDER — MORPHINE SULFATE (PF) 4 MG/ML IV SOLN: 1.0000 mg | INTRAVENOUS | Status: DC | PRN

## 2017-04-25 MED ORDER — OXYCODONE HCL 5 MG PO TABS: 5.0000 mg | ORAL_TABLET | ORAL | 0 refills | Status: DC | PRN

## 2017-04-25 MED ORDER — SODIUM CHLORIDE 0.9 % IV SOLN
1000.0000 mg | Freq: Once | INTRAVENOUS | Status: AC
  Administered 2017-04-25: 1000 mg via INTRAVENOUS
  Filled 2017-04-25: qty 10

## 2017-04-25 MED ORDER — ASPIRIN EC 325 MG PO TBEC
325.0000 mg | DELAYED_RELEASE_TABLET | Freq: Two times a day (BID) | ORAL | Status: DC
  Administered 2017-04-25 – 2017-04-27 (×4): 325 mg via ORAL
  Filled 2017-04-25 (×4): qty 1

## 2017-04-25 MED ORDER — INSULIN ASPART 100 UNIT/ML ~~LOC~~ SOLN
0.0000 [IU] | Freq: Three times a day (TID) | SUBCUTANEOUS | Status: DC
Start: 2017-04-25 — End: 2017-04-27
  Administered 2017-04-26: 3 [IU] via SUBCUTANEOUS
  Administered 2017-04-26: 7 [IU] via SUBCUTANEOUS
  Administered 2017-04-27: 4 [IU] via SUBCUTANEOUS

## 2017-04-25 MED ORDER — POTASSIUM CITRATE ER 10 MEQ (1080 MG) PO TBCR
20.0000 meq | EXTENDED_RELEASE_TABLET | Freq: Two times a day (BID) | ORAL | Status: DC
  Administered 2017-04-25 – 2017-04-27 (×3): 20 meq via ORAL
  Filled 2017-04-25 (×4): qty 2

## 2017-04-25 MED ORDER — MIDAZOLAM HCL 5 MG/5ML IJ SOLN
INTRAMUSCULAR | Status: DC | PRN
  Administered 2017-04-25: 2 mg via INTRAVENOUS

## 2017-04-25 MED ORDER — SENNOSIDES-DOCUSATE SODIUM 8.6-50 MG PO TABS: 1.0000 | ORAL_TABLET | Freq: Every evening | ORAL | 1 refills | Status: DC | PRN

## 2017-04-25 MED ORDER — PHENYLEPHRINE 40 MCG/ML (10ML) SYRINGE FOR IV PUSH (FOR BLOOD PRESSURE SUPPORT)
PREFILLED_SYRINGE | INTRAVENOUS | Status: AC
  Filled 2017-04-25: qty 10

## 2017-04-25 MED ORDER — VALSARTAN-HYDROCHLOROTHIAZIDE 320-25 MG PO TABS: 1.0000 | ORAL_TABLET | Freq: Every evening | ORAL | Status: DC

## 2017-04-25 MED ORDER — ASPIRIN EC 325 MG PO TBEC: 325.0000 mg | DELAYED_RELEASE_TABLET | Freq: Two times a day (BID) | ORAL | 0 refills | Status: DC

## 2017-04-25 MED ORDER — METOPROLOL TARTRATE 50 MG PO TABS
50.0000 mg | ORAL_TABLET | Freq: Two times a day (BID) | ORAL | Status: DC
  Administered 2017-04-25 – 2017-04-27 (×4): 50 mg via ORAL
  Filled 2017-04-25 (×4): qty 1

## 2017-04-25 MED ORDER — DEXAMETHASONE SODIUM PHOSPHATE 10 MG/ML IJ SOLN
10.0000 mg | Freq: Once | INTRAMUSCULAR | Status: AC
  Administered 2017-04-26: 10 mg via INTRAVENOUS
  Filled 2017-04-25: qty 1

## 2017-04-25 MED ORDER — SULFAMETHOXAZOLE-TRIMETHOPRIM 800-160 MG PO TABS: 1.0000 | ORAL_TABLET | Freq: Two times a day (BID) | ORAL | 0 refills | Status: DC

## 2017-04-25 MED ORDER — FENTANYL CITRATE (PF) 100 MCG/2ML IJ SOLN
100.0000 ug | Freq: Once | INTRAMUSCULAR | Status: AC
  Administered 2017-04-25: 100 ug via INTRAVENOUS

## 2017-04-25 MED ORDER — MIDAZOLAM HCL 2 MG/2ML IJ SOLN
INTRAMUSCULAR | Status: AC
  Administered 2017-04-25: 1 mg
  Filled 2017-04-25: qty 2

## 2017-04-25 MED ORDER — BUPIVACAINE-EPINEPHRINE (PF) 0.5% -1:200000 IJ SOLN
INTRAMUSCULAR | Status: DC | PRN
  Administered 2017-04-25: 25 mL via PERINEURAL

## 2017-04-25 MED ORDER — ACETAMINOPHEN 500 MG PO TABS
1000.0000 mg | ORAL_TABLET | Freq: Three times a day (TID) | ORAL | Status: DC | PRN
Start: 2017-04-25 — End: 2017-04-25

## 2017-04-25 MED ORDER — METHOCARBAMOL 1000 MG/10ML IJ SOLN
500.0000 mg | Freq: Four times a day (QID) | INTRAVENOUS | Status: DC | PRN
  Filled 2017-04-25: qty 5

## 2017-04-25 MED ORDER — OXYCODONE HCL ER 10 MG PO T12A: 10.0000 mg | EXTENDED_RELEASE_TABLET | Freq: Two times a day (BID) | ORAL | 0 refills | Status: DC

## 2017-04-25 MED ORDER — FENTANYL CITRATE (PF) 250 MCG/5ML IJ SOLN
INTRAMUSCULAR | Status: AC
  Filled 2017-04-25: qty 5

## 2017-04-25 MED ORDER — FENTANYL CITRATE (PF) 100 MCG/2ML IJ SOLN
25.0000 ug | INTRAMUSCULAR | Status: DC | PRN
  Administered 2017-04-25 (×2): 50 ug via INTRAVENOUS

## 2017-04-25 MED ORDER — MAGNESIUM CITRATE PO SOLN: 1.0000 | Freq: Once | ORAL | Status: DC | PRN

## 2017-04-25 MED ORDER — HYDROCHLOROTHIAZIDE 25 MG PO TABS
25.0000 mg | ORAL_TABLET | Freq: Every day | ORAL | Status: DC
  Administered 2017-04-25 – 2017-04-26 (×2): 25 mg via ORAL
  Filled 2017-04-25 (×2): qty 1

## 2017-04-25 MED ORDER — INSULIN ASPART 100 UNIT/ML ~~LOC~~ SOLN
0.0000 [IU] | Freq: Every day | SUBCUTANEOUS | Status: DC
  Administered 2017-04-26: 2 [IU] via SUBCUTANEOUS

## 2017-04-25 MED ORDER — ACETAMINOPHEN 325 MG PO TABS: 650.0000 mg | ORAL_TABLET | Freq: Four times a day (QID) | ORAL | Status: DC | PRN

## 2017-04-25 MED ORDER — ALUM & MAG HYDROXIDE-SIMETH 200-200-20 MG/5ML PO SUSP: 30.0000 mL | ORAL | Status: DC | PRN

## 2017-04-25 MED ORDER — DIPHENHYDRAMINE HCL 12.5 MG/5ML PO ELIX: 25.0000 mg | ORAL_SOLUTION | ORAL | Status: DC | PRN

## 2017-04-25 MED ORDER — SODIUM CHLORIDE 0.9 % IR SOLN
Status: DC | PRN
  Administered 2017-04-25: 3000 mL

## 2017-04-25 MED ORDER — ONDANSETRON HCL 4 MG/2ML IJ SOLN
INTRAMUSCULAR | Status: AC
  Filled 2017-04-25: qty 2

## 2017-04-25 MED ORDER — ACETAMINOPHEN 500 MG PO TABS
1000.0000 mg | ORAL_TABLET | Freq: Four times a day (QID) | ORAL | Status: AC
  Administered 2017-04-25 – 2017-04-26 (×4): 1000 mg via ORAL
  Filled 2017-04-25 (×4): qty 2

## 2017-04-25 MED ORDER — PROPOFOL 10 MG/ML IV BOLUS
INTRAVENOUS | Status: DC | PRN
  Administered 2017-04-25: 30 mg via INTRAVENOUS

## 2017-04-25 MED ORDER — ONDANSETRON HCL 4 MG/2ML IJ SOLN
INTRAMUSCULAR | Status: DC | PRN
  Administered 2017-04-25: 4 mg via INTRAVENOUS

## 2017-04-25 MED ORDER — PHENYLEPHRINE 40 MCG/ML (10ML) SYRINGE FOR IV PUSH (FOR BLOOD PRESSURE SUPPORT)
PREFILLED_SYRINGE | INTRAVENOUS | Status: DC | PRN
  Administered 2017-04-25: 80 ug via INTRAVENOUS

## 2017-04-25 MED ORDER — PROPOFOL 1000 MG/100ML IV EMUL
INTRAVENOUS | Status: AC
  Filled 2017-04-25: qty 200

## 2017-04-25 MED ORDER — LACTATED RINGERS IV SOLN
INTRAVENOUS | Status: DC
  Administered 2017-04-25 (×2): via INTRAVENOUS

## 2017-04-25 MED ORDER — BUPIVACAINE HCL (PF) 0.75 % IJ SOLN
INTRAMUSCULAR | Status: DC | PRN
  Administered 2017-04-25: 1.8 mL via INTRATHECAL

## 2017-04-25 MED ORDER — MENTHOL 3 MG MT LOZG: 1.0000 | LOZENGE | OROMUCOSAL | Status: DC | PRN

## 2017-04-25 MED ORDER — FENTANYL CITRATE (PF) 250 MCG/5ML IJ SOLN
INTRAMUSCULAR | Status: DC | PRN
  Administered 2017-04-25 (×2): 50 ug via INTRAVENOUS

## 2017-04-25 MED ORDER — SULFAMETHOXAZOLE-TRIMETHOPRIM 800-160 MG PO TABS: 1.0000 | ORAL_TABLET | Freq: Two times a day (BID) | ORAL | Status: DC

## 2017-04-25 MED ORDER — POLYETHYLENE GLYCOL 3350 17 G PO PACK: 17.0000 g | PACK | Freq: Every day | ORAL | Status: DC | PRN

## 2017-04-25 MED ORDER — PROMETHAZINE HCL 25 MG PO TABS: 25.0000 mg | ORAL_TABLET | Freq: Four times a day (QID) | ORAL | 1 refills | Status: DC | PRN

## 2017-04-25 MED ORDER — METOCLOPRAMIDE HCL 5 MG PO TABS: 5.0000 mg | ORAL_TABLET | Freq: Three times a day (TID) | ORAL | Status: DC | PRN

## 2017-04-25 SURGICAL SUPPLY — 68 items
ALCOHOL ISOPROPYL (RUBBING) (MISCELLANEOUS) ×2 IMPLANT
APL SKNCLS STERI-STRIP NONHPOA (GAUZE/BANDAGES/DRESSINGS) ×1
BAG DECANTER FOR FLEXI CONT (MISCELLANEOUS) ×2 IMPLANT
BANDAGE ACE 6X5 VEL STRL LF (GAUZE/BANDAGES/DRESSINGS) ×4 IMPLANT
BANDAGE ESMARK 6X9 LF (GAUZE/BANDAGES/DRESSINGS) ×1 IMPLANT
BENZOIN TINCTURE PRP APPL 2/3 (GAUZE/BANDAGES/DRESSINGS) ×2 IMPLANT
BLADE SAW SGTL 13.0X1.19X90.0M (BLADE) ×2 IMPLANT
BNDG CMPR 9X6 STRL LF SNTH (GAUZE/BANDAGES/DRESSINGS) ×1
BNDG ESMARK 6X9 LF (GAUZE/BANDAGES/DRESSINGS) ×2
BONE CEMENT PALACOS R-G (Cement) ×4 IMPLANT
BOWL SMART MIX CTS (DISPOSABLE) ×2 IMPLANT
CAPT KNEE TOTAL 3 ×1 IMPLANT
CEMENT BONE PALACOS R-G (Cement) ×2 IMPLANT
CLSR STERI-STRIP ANTIMIC 1/2X4 (GAUZE/BANDAGES/DRESSINGS) ×4 IMPLANT
COVER SURGICAL LIGHT HANDLE (MISCELLANEOUS) ×2 IMPLANT
CUFF TOURNIQUET SINGLE 34IN LL (TOURNIQUET CUFF) ×2 IMPLANT
CUFF TOURNIQUET SINGLE 44IN (TOURNIQUET CUFF) IMPLANT
DRAPE EXTREMITY T 121X128X90 (DRAPE) ×2 IMPLANT
DRAPE HALF SHEET 40X57 (DRAPES) ×2 IMPLANT
DRAPE INCISE IOBAN 66X45 STRL (DRAPES) ×1 IMPLANT
DRAPE ORTHO SPLIT 77X108 STRL (DRAPES) ×4
DRAPE SURG 17X11 SM STRL (DRAPES) ×4 IMPLANT
DRAPE SURG ORHT 6 SPLT 77X108 (DRAPES) ×2 IMPLANT
DRSG AQUACEL AG ADV 3.5X14 (GAUZE/BANDAGES/DRESSINGS) ×2 IMPLANT
DRSG TEGADERM 4X4.75 (GAUZE/BANDAGES/DRESSINGS) ×1 IMPLANT
DURAPREP 26ML APPLICATOR (WOUND CARE) ×4 IMPLANT
ELECT CAUTERY BLADE 6.4 (BLADE) ×2 IMPLANT
ELECT REM PT RETURN 9FT ADLT (ELECTROSURGICAL) ×2
ELECTRODE REM PT RTRN 9FT ADLT (ELECTROSURGICAL) ×1 IMPLANT
GAUZE SPONGE 4X4 12PLY STRL (GAUZE/BANDAGES/DRESSINGS) ×1 IMPLANT
GAUZE XEROFORM 1X8 LF (GAUZE/BANDAGES/DRESSINGS) ×1 IMPLANT
GLOVE SKINSENSE NS SZ7.5 (GLOVE) ×1
GLOVE SKINSENSE STRL SZ7.5 (GLOVE) ×1 IMPLANT
GLOVE SURG SYN 7.5  E (GLOVE) ×4
GLOVE SURG SYN 7.5 E (GLOVE) ×4 IMPLANT
GLOVE SURG SYN 7.5 PF PI (GLOVE) ×4 IMPLANT
GOWN STRL REIN XL XLG (GOWN DISPOSABLE) ×2 IMPLANT
GOWN STRL REUS W/ TWL LRG LVL3 (GOWN DISPOSABLE) ×1 IMPLANT
GOWN STRL REUS W/TWL LRG LVL3 (GOWN DISPOSABLE) ×2
HANDPIECE INTERPULSE COAX TIP (DISPOSABLE) ×2
HOOD PEEL AWAY FLYTE STAYCOOL (MISCELLANEOUS) ×4 IMPLANT
KIT BASIN OR (CUSTOM PROCEDURE TRAY) ×2 IMPLANT
KIT ROOM TURNOVER OR (KITS) ×2 IMPLANT
MANIFOLD NEPTUNE II (INSTRUMENTS) ×2 IMPLANT
MARKER SKIN DUAL TIP RULER LAB (MISCELLANEOUS) ×2 IMPLANT
NDL SPNL 18GX3.5 QUINCKE PK (NEEDLE) ×1 IMPLANT
NEEDLE SPNL 18GX3.5 QUINCKE PK (NEEDLE) ×2 IMPLANT
NS IRRIG 1000ML POUR BTL (IV SOLUTION) ×2 IMPLANT
PACK TOTAL JOINT (CUSTOM PROCEDURE TRAY) ×2 IMPLANT
PAD ARMBOARD 7.5X6 YLW CONV (MISCELLANEOUS) ×4 IMPLANT
SAW OSC TIP CART 19.5X105X1.3 (SAW) ×2 IMPLANT
SET HNDPC FAN SPRY TIP SCT (DISPOSABLE) ×1 IMPLANT
STAPLER VISISTAT 35W (STAPLE) IMPLANT
SUCTION FRAZIER HANDLE 10FR (MISCELLANEOUS) ×1
SUCTION TUBE FRAZIER 10FR DISP (MISCELLANEOUS) ×1 IMPLANT
SUT ETHILON 2 0 FS 18 (SUTURE) IMPLANT
SUT MNCRL AB 4-0 PS2 18 (SUTURE) IMPLANT
SUT VIC AB 0 CT1 27 (SUTURE) ×4
SUT VIC AB 0 CT1 27XBRD ANBCTR (SUTURE) ×2 IMPLANT
SUT VIC AB 1 CTX 27 (SUTURE) ×6 IMPLANT
SUT VIC AB 2-0 CT1 27 (SUTURE) ×6
SUT VIC AB 2-0 CT1 TAPERPNT 27 (SUTURE) ×3 IMPLANT
SYR 50ML LL SCALE MARK (SYRINGE) ×2 IMPLANT
TOWEL OR 17X24 6PK STRL BLUE (TOWEL DISPOSABLE) ×2 IMPLANT
TOWEL OR 17X26 10 PK STRL BLUE (TOWEL DISPOSABLE) ×2 IMPLANT
TRAY CATH 16FR W/PLASTIC CATH (SET/KITS/TRAYS/PACK) IMPLANT
UNDERPAD 30X30 (UNDERPADS AND DIAPERS) ×2 IMPLANT
WRAP KNEE MAXI GEL POST OP (GAUZE/BANDAGES/DRESSINGS) ×2 IMPLANT

## 2017-04-25 NOTE — Op Note (Signed)
Total Knee Arthroplasty Procedure Note  Preoperative diagnosis: Right knee osteoarthritis  Postoperative diagnosis:same  Operative procedure: Right total knee arthroplasty. CPT [phone]  Surgeon: N. Eduard Roux, MD  Assistants: April Green, RNFA  Anesthesia: Spinal, regional  Tourniquet time: less than 90 mins  Implants used: [employer] Femur: Size 7 Tibia: Size 7 Patella: 35 mm, 7.5 thick Polyethylene: 11 mm  Indication: Matthew Schmitt is a 66 y.o. year old male with a history of knee pain. Having failed conservative management, the patient elected to proceed with a total knee arthroplasty.  We have reviewed the risk and benefits of the surgery and they elected to proceed after voicing understanding.  Procedure:  After informed consent was obtained and understanding of the risk were voiced including but not limited to bleeding, infection, damage to surrounding structures including nerves and vessels, blood clots, leg length inequality and the failure to achieve desired results, the operative extremity was marked with verbal confirmation of the patient in the holding area.   The patient was then brought to the operating room and transported to the operating room table in the supine position.  A tourniquet was applied to the operative extremity around the upper thigh. The operative limb was then prepped and draped in the usual sterile fashion and preoperative antibiotics were administered.  A time out was performed prior to the start of surgery confirming the correct extremity, preoperative antibiotic administration, as well as team members, implants and instruments available for the case. Correct surgical site was also confirmed with preoperative radiographs. The limb was then elevated for exsanguination and the tourniquet was inflated. A midline incision was made and a standard medial parapatellar approach was performed.  The patella was prepared and sized to a 35 mm.  A  cover was placed on the patella for protection from retractors.  We then turned our attention to the femur. Posterior cruciate ligament was sacrificed. Start site was drilled in the femur and the intramedullary distal femoral cutting guide was placed, set at 5 degrees valgus, taking 9 mm of distal resection. The distal cut was made. Osteophytes were then removed. Next, the proximal tibial cutting guide was placed with appropriate slope, varus/valgus alignment and depth of resection. The proximal tibial cut was made. Gap blocks were then used to assess the extension gap and alignment, and appropriate soft tissue releases were performed. Attention was turned back to the femur, which was sized using the sizing guide to a size 7. Appropriate rotation of the femoral component was determined using epicondylar axis, Whiteside's line, and assessing the flexion gap under ligament tension. The appropriate size 4-in-1 cutting block was placed and cuts were made. Posterior femoral osteophytes and uncapped bone were then removed with the curved osteotome. The tibia was sized for a size 7 component. The femoral box-cutting guide was placed and prepared for a PS femoral component. Trial components were placed, and stability was checked in full extension, mid-flexion, and deep flexion. Proper tibial rotation was determined and marked.  The patella tracked well without a lateral release. Trial components were then removed and tibial preparation performed. A posterior capsular injection comprising of 20 cc of 1.3% exparel and 40 cc of normal saline was performed for postoperative pain control. The bony surfaces were irrigated with a pulse lavage and then dried. Bone cement was vacuum mixed on the back table, and the final components sized above were cemented into place. After cement had finished curing, excess cement was removed. The stability  of the construct was re-evaluated throughout a range of motion and found to be  acceptable. The trial liner was removed, the knee was copiously irrigated, and the knee was re-evaluated for any excess bone debris. The real polyethylene liner, 11 mm thick, was inserted and checked to ensure the locking mechanism had engaged appropriately. The tourniquet was deflated and hemostasis was achieved. The wound was irrigated with dilute betadine in normal saline, and then again with normal saline. A drain was not placed. Capsular closure was performed with a #1 vicryl, subcutaneous fat closed with a 0 vicryl suture, then subcutaneous tissue closed with interrupted 2.0 vicryl suture. The skin was then closed with a 3.0 monocryl. A sterile dressing was applied.   The patient was awakened in the operating room and taken to recovery in stable condition. All sponge, needle, and instrument counts were correct at the end of the case.  Position: supine  Complications: none.  Time Out: performed   Drains/Packing: none  Estimated blood loss: minimal  Returned to Recovery Room: in good condition.   Antibiotics: yes   Mechanical VTE (DVT) Prophylaxis: sequential compression devices, TED thigh-high  Chemical VTE (DVT) Prophylaxis: aspirin  Fluid Replacement  Crystalloid: see anesthesia record Blood: none  FFP: none   Specimens Removed: 1 to pathology   Sponge and Instrument Count Correct? yes   PACU: portable radiograph - knee AP and Lateral   Admission: inpatient status  Plan/RTC: Return in 2 weeks for wound check.   Weight Bearing/Load Lower Extremity: full   N. Eduard Roux, MD [place] [phone] 2:21 PM

## 2017-04-25 NOTE — Anesthesia Procedure Notes (Addendum)
Anesthesia Regional Block: Adductor canal block   Pre-Anesthetic Checklist: ,, timeout performed, Correct Patient, Correct Site, Correct Laterality, Correct Procedure, Correct Position, site marked, Risks and benefits discussed,  Surgical consent,  Pre-op evaluation,  At surgeon's request and post-op pain management  Laterality: Right and Lower  Prep: chloraprep       Needles:   Needle Type: Echogenic Stimulator Needle     Needle Length: 9cm  Needle Gauge: 21   Needle insertion depth: 5 cm   Additional Needles:   Procedures: ultrasound guided,,,,,,,,  Narrative:  Start time: 04/25/2017 11:35 AM End time: 04/25/2017 11:46 AM Injection made incrementally with aspirations every 5 mL.  Performed by: Personally  Anesthesiologist: Inaya Gillham

## 2017-04-25 NOTE — H&P (Signed)
PREOPERATIVE H&P  Chief Complaint: right knee degenerative joint disease  HPI: Matthew Schmitt is a 66 y.o. male who presents for surgical treatment of right knee degenerative joint disease.  He denies any changes in medical history.  Past Medical History:  Diagnosis Date  . Anxiety   . Arthritis    everywhere  . Bladder calculus   . Coronary artery disease    cardiologist-  dr Wynonia Lawman  . Essential hypertension, benign   . First degree heart block   . Heart murmur   . History of kidney stones   . History of melanoma excision    June 2016  --- s/p  excision bilateral leg   . Impotence of organic origin   . Lower urinary tract symptoms (LUTS)   . Melanoma of lower leg, right ([place]) 2014  . Mixed hyperlipidemia    slightly  . Multiple renal cysts    bilateral  . PAF (paroxysmal atrial fibrillation) ([place])   . Presence of surgical incision    x2  bilateral leg and right thigh  . RBBB   . Renal cell carcinoma (H[facility])    s/p  left partial nephrectomy  . Sleep apnea    no CPAP  . Type 2 diabetes mellitus ([place])    type2  . Wears glasses    Past Surgical History:  Procedure Laterality Date  . CARDIAC CATHETERIZATION  08-17-2011  DR SPENCER TILLEY   POSSIBLE MODERATE LAD STENOSIS JUST AFTER THE BIFURCATION OF LARGE DIAGONAL BRANCH/ LVEF  40-45%/ MILD GLOBAL HYPODINESIS (CATH DONE FOR ABNORMAL STRESS TEST OF FIXED LATERAL WALL DEFECT & BIFASCICULAR BLAOCK)  . CARDIOVERSION N/A 04/02/2013   Procedure: CARDIOVERSION;  Surgeon: Jacolyn Reedy, MD;  Location: [facility];  Service: Cardiovascular;  Laterality: N/A;  . CYSTOSCOPY WITH LITHOLAPAXY N/A 05/18/2015   Procedure: CYSTOSCOPY WITH LITHOLAPAXY;  Surgeon: Rana Snare, MD;  Location: [facility];  Service: Urology;  Laterality: N/A;  . CYSTOSCOPY WITH LITHOLAPAXY N/A 01/04/2017   Procedure: CYSTOSCOPY WITH LITHOLAPAXY, Holmium Laser;  Surgeon: Ardis Hughs, MD;  Location: [facility];  Service: Urology;  Laterality:  N/A;  . CYSTOSCOPY WITH RETROGRADE PYELOGRAM, URETEROSCOPY AND STENT PLACEMENT  11/03/2012   Procedure: CYSTOSCOPY WITH RETROGRADE PYELOGRAM, URETEROSCOPY AND STENT PLACEMENT;  Surgeon: Bernestine Amass, MD;  Location: [facility];  Service: Urology;  Laterality: Left;  Cystoscopy/Left Retrograde/Ureteroscopy/Holmium Laser Litho/Double J Stent  . HOLMIUM LASER APPLICATION  60/08/9322   Procedure: HOLMIUM LASER APPLICATION;  Surgeon: Bernestine Amass, MD;  Location[facility];  Service: Urology;  Laterality: Left;  . HOLMIUM LASER APPLICATION N/A 5/57/3220   Procedure: HOLMIUM LASER APPLICATION;  Surgeon: Rana Snare, MD;  Location: [facility];  Service: Urology;  Laterality: N/A;  . JOINT REPLACEMENT    . KNEE SURGERY Bilateral 1978  &  1974  . MELANOMA EXCISION Bilateral June 2016   20th-- left leg //  1st-- right leg w/ skin graft from right thigh (no lymph node bx)  . NEPHROLITHOTOMY Right 05/17/2014   Procedure: NEPHROLITHOTOMY PERCUTANEOUS;  Surgeon: Bernestine Amass, MD;  Location: [facility];  Service: Urology;  Laterality: Right;  . PILONIDAL CYST EXCISION    . ROBOTIC ASSITED PARTIAL NEPHRECTOMY Left 06/18/2013   Procedure: ROBOTIC ASSISTED PARTIAL NEPHRECTOMY;  Surgeon: Dutch Gray, MD;  Location: [facility];  Service: Urology;  Laterality: Left;  . ROTATOR CUFF REPAIR Left 07/2016  . TONSILLECTOMY  69  (age 69)  .  TOTAL KNEE ARTHROPLASTY Left 02/07/2017   Procedure: LEFT TOTAL KNEE ARTHROPLASTY;  Surgeon: Leandrew Koyanagi, MD;  Location: [facility];  Service: Orthopedics;  Laterality: Left;  . TRANSURETHRAL RESECTION OF PROSTATE N/A 01/04/2017   Procedure: TRANSURETHRAL RESECTION OF THE PROSTATE (TURP);  Surgeon: Ardis Hughs, MD;  Location: [facility];  Service: Urology;  Laterality: N/A;   Social History   Social History  . Marital status: Married    Spouse name: N/A  . Number of children: 2  . Years of education: 16   Occupational History  .  Government social research officer, Psychologist, occupational    Social History Main Topics  . Smoking status: Former Smoker    Packs/day: 1.00    Years: 6.00    Types: Cigarettes    Quit date: 10/31/1969  . Smokeless tobacco: Never Used  . Alcohol use 0.0 oz/week     Comment: OCCASIONAL  . Drug use: No  . Sexual activity: No   Other Topics Concern  . None   Social History Narrative   Regular exercise-no   Family History  Problem Relation Age of Onset  . Arthritis Father   . Hypertension Father   . Diabetes Father   . Arthritis Mother   . Hypertension Mother   . Heart attack Brother 17  . Diabetes Brother   . Lymphoma Paternal Grandfather   . Cancer Maternal Uncle   . Cancer Paternal Uncle        type unknown   Allergies  Allergen Reactions  . Actos [Pioglitazone] Swelling    SWELLING REACTION UNSPECIFIED  EDEMA   Prior to Admission medications   Medication Sig Start Date End Date Taking? Authorizing Provider  acetaminophen (TYLENOL) 500 MG tablet Take 1,000 mg by mouth 3 (three) times daily as needed for moderate pain or headache.    Yes [provider]  amLODipine (NORVASC) 5 MG tablet Take 5 mg by mouth every evening.    Yes [provider]  aspirin EC 81 MG tablet Take 81 mg by mouth at bedtime.   Yes [provider]  Aspirin-Caffeine (BAYER BACK & BODY PAIN EX ST PO) Take 2 tablets by mouth 2 (two) times daily as needed (pain).   Yes [provider]  doxazosin (CARDURA) 8 MG tablet Take 8 mg by mouth at bedtime.    Yes [provider]  Insulin Glargine (BASAGLAR KWIKPEN) 100 UNIT/ML SOPN Inject 2.4 mLs (240 Units total) into the skin every morning. And pen needles 3/day Patient taking differently: Inject 240 Units into the skin daily. *HOLD FOR BLOOD SUGAR LESS THAN 120* 11/23/16  Yes Renato Shin, MD  Menthol, Topical Analgesic, (BENGAY EX) Apply 1 application topically daily as needed (pain).   Yes [provider]  metoprolol  (LOPRESSOR) 50 MG tablet Take 50 mg by mouth 2 (two) times daily.  11/15/16  Yes [provider]  ONE TOUCH ULTRA TEST test strip USE 1 EACH 2 TIMES DAILY                              . 07/13/16  Yes Renato Shin, MD  potassium citrate (UROCIT-K) 10 MEQ (1080 MG) SR tablet Take 2 tablets (20 mEq total) by mouth 2 (two) times daily. Patient taking differently: Take 20 mEq by mouth every evening.  05/18/14  Yes Rana Snare, MD  pravastatin (PRAVACHOL) 40 MG tablet Take 40 mg by mouth every evening.    Yes  [provider]  valsartan-hydrochlorothiazide (DIOVAN-HCT) 320-25 MG per tablet Take 1 tablet by mouth every evening.    Yes [provider]  aspirin EC 325 MG tablet TAKE 1 TABLET BY MOUTH TWICE A DAY Patient not taking: Reported on 03/29/2017 03/20/17   Leandrew Koyanagi, MD  docusate sodium (COLACE) 100 MG capsule Take 1 capsule (100 mg total) by mouth 2 (two) times daily. Patient not taking: Reported on 03/29/2017 01/04/17   Ardis Hughs, MD  methocarbamol (ROBAXIN) 750 MG tablet Take 1 tablet (750 mg total) by mouth 2 (two) times daily as needed for muscle spasms. Patient not taking: Reported on 03/29/2017 02/07/17   Leandrew Koyanagi, MD  ondansetron (ZOFRAN) 4 MG tablet Take 1-2 tablets (4-8 mg total) by mouth every 8 (eight) hours as needed for nausea or vomiting. Patient not taking: Reported on 03/29/2017 02/07/17   Leandrew Koyanagi, MD  oxyCODONE (OXY IR/ROXICODONE) 5 MG immediate release tablet Take 1 tablet (5 mg total) by mouth every 6 (six) hours as needed for severe pain. Patient not taking: Reported on 03/29/2017 02/26/17   Leandrew Koyanagi, MD  oxyCODONE (OXYCONTIN) 10 mg 12 hr tablet Take 1 tablet (10 mg total) by mouth every 12 (twelve) hours. Patient not taking: Reported on 03/29/2017 02/07/17   Leandrew Koyanagi, MD  promethazine (PHENERGAN) 25 MG tablet Take 1 tablet (25 mg total) by mouth every 6 (six) hours as needed for nausea. Patient not taking: Reported on 03/29/2017  02/07/17   Leandrew Koyanagi, MD  sulfamethoxazole-trimethoprim (BACTRIM DS,SEPTRA DS) 800-160 MG tablet Take 1 tablet by mouth 2 (two) times daily. Patient not taking: Reported on 03/29/2017 02/07/17   Leandrew Koyanagi, MD     Positive ROS: All other systems have been reviewed and were otherwise negative with the exception of those mentioned in the HPI and as above.  Physical Exam: General: Alert, no acute distress Cardiovascular: No pedal edema Respiratory: No cyanosis, no use of accessory musculature GI: abdomen soft Skin: No lesions in the area of chief complaint Neurologic: Sensation intact distally Psychiatric: Patient is competent for consent with normal mood and affect Lymphatic: no lymphedema  MUSCULOSKELETAL: exam stable  Assessment: right knee degenerative joint disease  Plan: Plan for Procedure(s): RIGHT TOTAL KNEE ARTHROPLASTY  The risks benefits and alternatives were discussed with the patient including but not limited to the risks of nonoperative treatment, versus surgical intervention including infection, bleeding, nerve injury,  blood clots, cardiopulmonary complications, morbidity, mortality, among others, and they were willing to proceed.   Eduard Roux, MD   04/25/2017 10:54 AM

## 2017-04-25 NOTE — Progress Notes (Signed)
Orthopedic Tech Progress Note Patient Details:  Matthew Schmitt August 14, 1951 [id number]  CPM Right Knee CPM Right Knee: On Right Knee Flexion (Degrees): 90 Right Knee Extension (Degrees): 0   Braulio Bosch 04/25/2017, 3:48 PM

## 2017-04-25 NOTE — Discharge Instructions (Signed)

## 2017-04-25 NOTE — Anesthesia Procedure Notes (Signed)
Spinal  Patient location during procedure: OR Start time: 04/25/2017 12:20 PM End time: 04/25/2017 12:36 PM Staffing Anesthesiologist: Rica Koyanagi Performed: anesthesiologist  Preanesthetic Checklist Completed: patient identified, site marked, surgical consent, pre-op evaluation, timeout performed, IV checked, risks and benefits discussed and monitors and equipment checked Spinal Block Patient position: sitting Prep: Betadine Patient monitoring: heart rate, cardiac monitor, continuous pulse ox and blood pressure Approach: midline Location: L3-4 Injection technique: single-shot Needle Needle type: Quincke  Needle gauge: 25 G Needle length: 9 cm Needle insertion depth: 9 cm Assessment Sensory level: T8 Additional Notes Tolerated well .

## 2017-04-25 NOTE — Transfer of Care (Signed)
Immediate Anesthesia Transfer of Care Note  Patient: Matthew Schmitt  Procedure(s) Performed: Procedure(s): RIGHT TOTAL KNEE ARTHROPLASTY (Right)  Patient Location: PACU  Anesthesia Type:MAC, Spinal and MAC combined with regional for post-op pain  Level of Consciousness: drowsy, patient cooperative and responds to stimulation  Airway & Oxygen Therapy: Patient Spontanous Breathing and Patient connected to face mask oxygen  Post-op Assessment: Report given to RN and Post -op Vital signs reviewed and stable  Post vital signs: Reviewed and stable  Last Vitals:  Vitals:   04/25/17 1200 04/25/17 1205  BP: (!) 166/60 (!) 139/59  Pulse: 70 65  Resp: (!) 21 10  Temp:      Last Pain:  Vitals:   04/25/17 1041  TempSrc: Oral         Complications: No apparent anesthesia complications

## 2017-04-25 NOTE — Progress Notes (Signed)
Open area noted to left lower leg, also noted blister like areas  To left lower leg TED hose left off. Encouraged pt to let Dr Erlinda Hong know so he can assess this area. Both lower legs are discolored.

## 2017-04-25 NOTE — Progress Notes (Signed)
Dr Erlinda Hong in to see pt he assessed left lower leg.

## 2017-04-25 NOTE — Anesthesia Preprocedure Evaluation (Addendum)
Anesthesia Evaluation  Patient identified by MRN, date of birth, ID band Patient awake    Reviewed: Allergy & Precautions, NPO status , Patient's Chart, lab work & pertinent test results  Airway Mallampati: II   Neck ROM: Full    Dental no notable dental hx.    Pulmonary sleep apnea , former smoker,    breath sounds clear to auscultation       Cardiovascular hypertension, + CAD and +CHF  + dysrhythmias  Rhythm:Regular Rate:Normal     Neuro/Psych    GI/Hepatic   Endo/Other  diabetes  Renal/GU Renal InsufficiencyRenal disease     Musculoskeletal  (+) Arthritis ,   Abdominal (+) + obese,   Peds  Hematology   Anesthesia Other Findings   Reproductive/Obstetrics                            Anesthesia Physical Anesthesia Plan  ASA: III  Anesthesia Plan: Spinal   Post-op Pain Management:  Regional for Post-op pain   Induction: Intravenous  PONV Risk Score and Plan: 3 and Ondansetron, Dexamethasone, Propofol, Midazolam and Treatment may vary due to age  Airway Management Planned: Simple Face Mask  Additional Equipment:   Intra-op Plan:   Post-operative Plan:   Informed Consent: I have reviewed the patients History and Physical, chart, labs and discussed the procedure including the risks, benefits and alternatives for the proposed anesthesia with the patient or authorized representative who has indicated his/her understanding and acceptance.     Plan Discussed with: CRNA  Anesthesia Plan Comments:       Anesthesia Quick Evaluation

## 2017-04-26 ENCOUNTER — Encounter (HOSPITAL_COMMUNITY): Payer: Self-pay | Admitting: Orthopaedic Surgery

## 2017-04-26 LAB — CBC
HCT: 37.9 % — ABNORMAL LOW (ref 39.0–52.0)
Hemoglobin: 12 g/dL — ABNORMAL LOW (ref 13.0–17.0)
MCH: 26.2 pg (ref 26.0–34.0)
MCHC: 31.7 g/dL (ref 30.0–36.0)
MCV: 82.8 fL (ref 78.0–100.0)
Platelets: 130 10*3/uL — ABNORMAL LOW (ref 150–400)
RBC: 4.58 MIL/uL (ref 4.22–5.81)
RDW: 13.9 % (ref 11.5–15.5)
WBC: 5.3 10*3/uL (ref 4.0–10.5)

## 2017-04-26 LAB — BASIC METABOLIC PANEL
Anion gap: 5 (ref 5–15)
BUN: 21 mg/dL — ABNORMAL HIGH (ref 6–20)
CO2: 29 mmol/L (ref 22–32)
Calcium: 8.3 mg/dL — ABNORMAL LOW (ref 8.9–10.3)
Chloride: 104 mmol/L (ref 101–111)
Creatinine, Ser: 1.03 mg/dL (ref 0.61–1.24)
GFR calc Af Amer: >60 mL/min (ref >60)
GFR calc non Af Amer: >60 mL/min (ref >60)
Glucose, Bld: 122 mg/dL — ABNORMAL HIGH (ref 65–99)
Potassium: 3.5 mmol/L (ref 3.5–5.1)
Sodium: 138 mmol/L (ref 135–145)

## 2017-04-26 LAB — GLUCOSE, CAPILLARY
Glucose-Capillary: 118 mg/dL — ABNORMAL HIGH (ref 65–99)
Glucose-Capillary: 143 mg/dL — ABNORMAL HIGH (ref 65–99)
Glucose-Capillary: 243 mg/dL — ABNORMAL HIGH (ref 65–99)
Glucose-Capillary: 239 mg/dL — ABNORMAL HIGH (ref 65–99)

## 2017-04-26 NOTE — Progress Notes (Signed)
PT Cancellation Note  Patient Details Name: MAXAMILLIAN TIENDA MRN: [id number] DOB: 05-18-1951   Cancelled Treatment:    Reason Eval/Treat Not Completed: Other (comment).  Pt is in the bathroom getting washed up.  PT to check back later this AM.   Thanks,    Wells Guiles B. [place], [place], DPT [phone]   04/26/2017, 9:44 AM

## 2017-04-26 NOTE — Progress Notes (Signed)
   Subjective:  Patient reports pain as mild.  No events.  Objective:   VITALS:   Vitals:   04/25/17 1605 04/25/17 2037 04/26/17 0048 04/26/17 0543  BP: 130/68 132/73 116/71 111/62  Pulse: 64 67 63 61  Resp: 14 16 16 16   Temp: 97.9 F (36.6 C) 97.4 F (36.3 C) 98 F (36.7 C) 98.1 F (36.7 C)  TempSrc:  Oral Oral Oral  SpO2: 99% 97% 96% 98%  Weight:      Height:        Neurologically intact Neurovascular intact Sensation intact distally Intact pulses distally Dorsiflexion/Plantar flexion intact Incision: dressing C/D/I and no drainage No cellulitis present Compartment soft   Lab Results  Component Value Date   WBC 4.4 04/19/2017   HGB 14.3 04/19/2017   HCT 43.5 04/19/2017   MCV 81.2 04/19/2017   PLT 158 04/19/2017     Assessment/Plan:  1 Day Post-Op   - Expected postop acute blood loss anemia - will monitor for symptoms - Up with PT/OT - DVT ppx - SCDs, ambulation, aspirin - WBAT operative extremity - Pain control - bactrim for superficial leg abrasion - Discharge planning  Eduard Roux 04/26/2017, 7:46 AM [phone]

## 2017-04-26 NOTE — Plan of Care (Signed)
Problem: Safety: Goal: Ability to remain free from injury will improve Outcome: Progressing No falls during this admission. Call bell within reach. Bed in low and locked position. 3/4 siderails in place. Nonskid footwear being utilized. Patient alert and oriented. Clean and clear environment maintained. Patient verbalized understanding of safety instruction. Patient ambulates well with FWW and standby assist.  Problem: Pain Management: Goal: Pain level will decrease with appropriate interventions Outcome: Progressing Pain being managed with PO PRN pain medication. Vital signs are stable. No facial grimacing or moaning evident.

## 2017-04-26 NOTE — Evaluation (Signed)
Physical Therapy Evaluation Patient Details Name: Matthew Schmitt MRN: [id number] DOB: 1951/11/10 Today's Date: 04/26/2017   History of Present Illness  Pt is a 66 yo male s/p R TKA (04/25/17). PMH significant for L TKA anxiety, CAD, HTN, Heart murmur, and  A-fib.   Clinical Impression  Pt admitted with above diagnosis. Pt currently with functional limitations due to the deficits listed below (see PT Problem List). Pt will benefit from skilled PT to increase their independence and safety with mobility to allow discharge to the venue listed below.  Pt moving at S to MOD I level at this time.  Will need stair training prior to d/c.  Anticipate good progress with PT.     Follow Up Recommendations Home health PT    Equipment Recommendations  None recommended by PT    Recommendations for Other Services       Precautions / Restrictions Precautions Precautions: Knee Precaution Booklet Issued: Yes (comment) Restrictions Weight Bearing Restrictions: Yes RLE Weight Bearing: Weight bearing as tolerated      Mobility  Bed Mobility Overal bed mobility: Modified Independent             General bed mobility comments: MOD I for sit > supine  Transfers Overall transfer level: Modified independent Equipment used: Rolling walker (2 wheeled)             General transfer comment: Pt stood from standard chair without arm rests  Ambulation/Gait Ambulation/Gait assistance: Supervision Ambulation Distance (Feet): 250 Feet Assistive device: Rolling walker (2 wheeled) Gait Pattern/deviations: Step-through pattern;Trunk flexed Gait velocity: decreased   General Gait Details: Cues for posture.  Pt reports he is starting to feel his knee more.  Stairs            Wheelchair Mobility    Modified Rankin (Stroke Patients Only)       Balance Overall balance assessment: No apparent balance deficits (not formally assessed)                                            Pertinent Vitals/Pain Pain Assessment: 0-10 Pain Score: 2  Pain Location: R knee Pain Descriptors / Indicators: Operative site guarding;Sore Pain Intervention(s): Limited activity within patient's tolerance;Monitored during session;Repositioned;Ice applied    Home Living Family/patient expects to be discharged to:: Private residence Living Arrangements: Spouse/significant other Available Help at Discharge: Family;Available 24 hours/day Type of Home: House Home Access: Stairs to enter Entrance Stairs-Rails: None Entrance Stairs-Number of Steps: 2 Home Layout: Two level;Bed/bath upstairs;Able to live on main level with bedroom/bathroom Home Equipment: Gilford Rile - 2 wheels;Bedside commode      Prior Function           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     Hand Dominance   Dominant Hand: Right    Extremity/Trunk Assessment   Upper Extremity Assessment Upper Extremity Assessment: Overall WFL for tasks assessed    Lower Extremity Assessment Lower Extremity Assessment: RLE deficits/detail RLE Deficits / Details: -8 to 65 degrees R knee flexion       Communication   Communication: No difficulties  Cognition Arousal/Alertness: Awake/alert Behavior During Therapy: WFL for tasks assessed/performed Overall Cognitive Status: Within Functional Limits for tasks assessed  General Comments      Exercises Total Joint Exercises Ankle Circles/Pumps: AROM;Both;10 reps;Supine Quad Sets: Strengthening;Right;10 reps;Supine Towel Squeeze: 10 reps;Supine Heel Slides: AROM;Right;Supine;10 reps Straight Leg Raises: AROM;Strengthening;Right;Supine;10 reps Goniometric ROM: R knee -8 to 65 degrees   Assessment/Plan    PT Assessment Patient needs continued PT services  PT Problem List Decreased strength;Decreased range of motion;Decreased mobility;Decreased balance       PT Treatment  Interventions DME instruction;Gait training;Stair training;Functional mobility training;Balance training;Therapeutic exercise;Therapeutic activities    PT Goals (Current goals can be found in the Care Plan section)  Acute Rehab PT Goals Patient Stated Goal: Return to work PT Goal Formulation: With patient Time For Goal Achievement: 05/03/17    Frequency 7X/week   Barriers to discharge        Co-evaluation               AM-PAC PT "6 Clicks" Daily Activity  Outcome Measure Difficulty turning over in bed (including adjusting bedclothes, sheets and blankets)?: None Difficulty moving from lying on back to sitting on the side of the bed? : None Difficulty sitting down on and standing up from a chair with arms (e.g., wheelchair, bedside commode, etc,.)?: None Help needed moving to and from a bed to chair (including a wheelchair)?: None Help needed walking in hospital room?: A Little Help needed climbing 3-5 steps with a railing? : A Little 6 Click Score: 22    End of Session Equipment Utilized During Treatment: Gait belt Activity Tolerance: Patient tolerated treatment well Patient left: in bed;with call bell/phone within reach Nurse Communication: Mobility status PT Visit Diagnosis: Difficulty in walking, not elsewhere classified (R26.2);Muscle weakness (generalized) (M62.81)    Time: 0240-9735 PT Time Calculation (min) (ACUTE ONLY): 33 min   Charges:   PT Evaluation $PT Eval Moderate Complexity: 1 Procedure PT Treatments $Gait Training: 8-22 mins   PT G Codes:        Curtis Uriarte L. Tamala Julian, [place] Pager [phone] 04/26/2017   Galen Manila 04/26/2017, 12:49 PM

## 2017-04-26 NOTE — Progress Notes (Signed)
Physical Therapy Treatment Patient Details Name: Matthew Schmitt MRN: [id number] DOB: 04/24/1951 Today's Date: 04/26/2017    History of Present Illness Pt is a 66 yo male s/p R TKA (04/25/17). PMH significant for L TKA anxiety, CAD, HTN, Heart murmur, and  A-fib.     PT Comments    Pt is progressing well with his gait and mobility.  He is supervision level overall.  He declined wanting to go ahead and practice the stairs this PM, he is agreeable to do it tomorrow before d/c home.    Follow Up Recommendations  Home health PT     Equipment Recommendations  None recommended by PT    Recommendations for Other Services   NA     Precautions / Restrictions Precautions Precautions: Knee Precaution Booklet Issued: Yes (comment) Precaution Comments: knee exercise handout given in previous session, no pillow under surgical knee reviewed.  Restrictions Weight Bearing Restrictions: Yes RLE Weight Bearing: Weight bearing as tolerated    Mobility  Bed Mobility Overal bed mobility: Modified Independent             General bed mobility comments: MOD I for sit > supine  Transfers Overall transfer level: Needs assistance Equipment used: Rolling walker (2 wheeled) Transfers: Sit to/from Stand Sit to Stand: Supervision         General transfer comment: supervision for safety  Ambulation/Gait Ambulation/Gait assistance: Supervision Ambulation Distance (Feet): 220 Feet Assistive device: Rolling walker (2 wheeled) Gait Pattern/deviations: Step-through pattern;Antalgic Gait velocity: decreased   General Gait Details: Pt with mildly antalgic gait pattern, verbal cues for right leg to lead into the RW.    Stairs Stairs: Yes       General stair comments: Pt preferred to practice the stairs tomorrow AM.  We did walk past the PT gym.          Balance Overall balance assessment: Needs assistance Sitting-balance support: Feet supported;No upper extremity supported Sitting  balance-Leahy Scale: Good     Standing balance support: Bilateral upper extremity supported;No upper extremity supported;Single extremity supported Standing balance-Leahy Scale: Good Standing balance comment: Pt pretty stable even with his hands off of RW.                             Cognition Arousal/Alertness: Awake/alert Behavior During Therapy: WFL for tasks assessed/performed Overall Cognitive Status: Within Functional Limits for tasks assessed                                        Exercises Total Joint Exercises Ankle Circles/Pumps: AROM;Both;10 reps;Supine Quad Sets: Strengthening;Right;10 reps;Supine Towel Squeeze: 10 reps;Supine Heel Slides: AAROM;Right;10 reps Hip ABduction/ADduction: AROM;Right;10 reps Straight Leg Raises: AROM;Right;10 reps Goniometric ROM: R knee -8 to 65 degrees        Pertinent Vitals/Pain Pain Assessment: 0-10 Pain Score: 3  Pain Location: right knee Pain Descriptors / Indicators: Operative site guarding;Sore Pain Intervention(s): Limited activity within patient's tolerance;Monitored during session;Repositioned;Ice applied           PT Goals (current goals can now be found in the care plan section) Acute Rehab PT Goals Patient Stated Goal: 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  PT Goal Formulation: With patient Time For Goal Achievement: 05/03/17 Progress towards PT goals: Progressing toward goals    Frequency  7X/week      PT Plan Current plan remains appropriate       AM-PAC PT "6 Clicks" Daily Activity  Outcome Measure  Difficulty turning over in bed (including adjusting bedclothes, sheets and blankets)?: None Difficulty moving from lying on back to sitting on the side of the bed? : None Difficulty sitting down on and standing up from a chair with arms (e.g., wheelchair, bedside commode, etc,.)?: None Help needed moving to and from a bed to  chair (including a wheelchair)?: None Help needed walking in hospital room?: None Help needed climbing 3-5 steps with a railing? : A Little 6 Click Score: 23    End of Session Equipment Utilized During Treatment: Gait belt Activity Tolerance: Patient tolerated treatment well Patient left: in bed;in CPM;with call bell/phone within reach Nurse Communication: Mobility status PT Visit Diagnosis: Difficulty in walking, not elsewhere classified (R26.2);Muscle weakness (generalized) (M62.81);Pain Pain - Right/Left: Right Pain - part of body: Knee     Time: 6948-5462 PT Time Calculation (min) (ACUTE ONLY): 32 min  Charges:  $Gait Training: 8-22 mins $Therapeutic Exercise: 8-22 mins          Rupert Azzara B. [place], [place], DPT [phone]            04/26/2017, 4:22 PM

## 2017-04-27 LAB — GLUCOSE, CAPILLARY
Glucose-Capillary: 217 mg/dL — ABNORMAL HIGH (ref 65–99)
Glucose-Capillary: 188 mg/dL — ABNORMAL HIGH (ref 65–99)

## 2017-04-27 MED ORDER — OXYCODONE HCL ER 10 MG PO T12A: 10.0000 mg | EXTENDED_RELEASE_TABLET | Freq: Two times a day (BID) | ORAL | 0 refills | Status: DC

## 2017-04-27 NOTE — Progress Notes (Signed)
Pt discharge education and instructions completed with pt and spouse at bedside; both voices understanding and denies any questions. Pt IV removed; right knee incision dsg remains clean, dry and intact with no or active bleeding noted. Pt handed all his prescriptions including his oxycodone and oxycontin. Pt transported off unit via wheelchair with spouse and belongings to the side. Home Health set up by case management. Delia Heady RN

## 2017-04-27 NOTE — Progress Notes (Signed)
HHN orders placed. Willl use [facility] for PT/OT

## 2017-04-27 NOTE — Progress Notes (Signed)
CM notified Tommi Emery of [facility] of pt discharge. (pt previously setup with [facility]).  No other CM needs were communicated.

## 2017-04-27 NOTE — Progress Notes (Signed)
Physical Therapy Treatment Patient Details Name: Matthew Schmitt MRN: [id number] DOB: 1950/11/30 Today's Date: 04/27/2017    History of Present Illness Pt is a 66 yo male s/p R TKA (04/25/17). PMH significant for L TKA anxiety, CAD, HTN, Heart murmur, and  A-fib.     PT Comments    Patient is making good progress with PT.  From a mobility standpoint anticipate patient will be ready for DC home when medically ready.    Follow Up Recommendations  Home health PT     Equipment Recommendations  None recommended by PT    Recommendations for Other Services       Precautions / Restrictions Precautions Precautions: Knee Precaution Booklet Issued: Yes (comment) Precaution Comments: reviewed precautions/positioning Restrictions Weight Bearing Restrictions: Yes RLE Weight Bearing: Weight bearing as tolerated    Mobility  Bed Mobility Overal bed mobility: Modified Independent             General bed mobility comments: increased time and effort  Transfers Overall transfer level: Needs assistance Equipment used: Rolling walker (2 wheeled) Transfers: Sit to/from Stand Sit to Stand: Supervision         General transfer comment: supervision for safety  Ambulation/Gait Ambulation/Gait assistance: Supervision Ambulation Distance (Feet): 250 Feet Assistive device: Rolling walker (2 wheeled) Gait Pattern/deviations: Step-through pattern;Antalgic Gait velocity: decreased   General Gait Details: pt with good step through pattern and safe use of AD   Stairs     Stair Management: No rails;Backwards;With walker;One rail Right;Step to pattern;Sideways Number of Stairs:  (2 steps X2) General stair comments: cues for sequencing and technique; practiced both forward with single rail and backwards with RW; assist to stabilize RW  Wheelchair Mobility    Modified Rankin (Stroke Patients Only)       Balance Overall balance assessment: Needs assistance Sitting-balance support:  Feet supported;No upper extremity supported Sitting balance-Leahy Scale: Good     Standing balance support: Bilateral upper extremity supported;No upper extremity supported;Single extremity supported Standing balance-Leahy Scale: Good Standing balance comment: Pt pretty stable even with his hands off of RW.                             Cognition Arousal/Alertness: Awake/alert Behavior During Therapy: WFL for tasks assessed/performed Overall Cognitive Status: Within Functional Limits for tasks assessed                                        Exercises Total Joint Exercises Quad Sets: AROM;Right;10 reps;Supine Heel Slides: Right;10 reps;AROM;Supine Goniometric ROM: up to 85 degree flexion AROM    General Comments General comments (skin integrity, edema, etc.): Pt very eager to head home. States he wants to wash up with nursing - notified NT      Pertinent Vitals/Pain Pain Assessment: Faces Faces Pain Scale: Hurts little more Pain Location: right knee Pain Descriptors / Indicators: Operative site guarding;Sore Pain Intervention(s): Limited activity within patient's tolerance;Monitored during session;Premedicated before session;Repositioned    Home Living Family/patient expects to be discharged to:: Private residence Living Arrangements: Spouse/significant other Available Help at Discharge: Family;Available 24 hours/day Type of Home: House Home Access: Stairs to enter Entrance Stairs-Rails: None Home Layout: Two level;Bed/bath upstairs;Able to live on main level with bedroom/bathroom Home Equipment: Gilford Rile - 2 wheels;Bedside commode;Shower seat - built in;Hand held shower head      Prior Function Level  of Independence: Independent      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  PT Goal Formulation: With patient Time For Goal Achievement: 05/03/17 Progress towards PT goals: Progressing toward goals    Frequency    7X/week      PT Plan Current plan remains appropriate    Co-evaluation              AM-PAC PT "6 Clicks" Daily Activity  Outcome Measure  Difficulty turning over in bed (including adjusting bedclothes, sheets and blankets)?: None Difficulty moving from lying on back to sitting on the side of the bed? : None Difficulty sitting down on and standing up from a chair with arms (e.g., wheelchair, bedside commode, etc,.)?: None Help needed moving to and from a bed to chair (including a wheelchair)?: None Help needed walking in hospital room?: None Help needed climbing 3-5 steps with a railing? : A Little 6 Click Score: 23    End of Session Equipment Utilized During Treatment: Gait belt Activity Tolerance: Patient tolerated treatment well Patient left: with call bell/phone within reach;in chair Nurse Communication: Mobility status PT Visit Diagnosis: Difficulty in walking, not elsewhere classified (R26.2);Muscle weakness (generalized) (M62.81);Pain Pain - Right/Left: Right Pain - part of body: Knee     Time: 0831-0901 PT Time Calculation (min) (ACUTE ONLY): 30 min  Charges:  $Gait Training: 8-22 mins $Therapeutic Exercise: 8-22 mins                    G Codes:       Earney Navy, PTA Pager: [phone]     Darliss Cheney 04/27/2017, 10:07 AM

## 2017-04-27 NOTE — Discharge Summary (Signed)
Physician Discharge Summary      Patient ID: Matthew Schmitt MRN: [id number] DOB/AGE: 12/08/1950 66 y.o.  Admit date: 04/25/2017 Discharge date: 04/27/2017  Admission Diagnoses:  <principal problem not specified>  Discharge Diagnoses:  Active Problems:   Total knee replacement status   Past Medical History:  Diagnosis Date  . Anxiety   . Arthritis    everywhere  . Bladder calculus   . Coronary artery disease    cardiologist-  dr Wynonia Lawman  . Essential hypertension, benign   . First degree heart block   . Heart murmur   . History of kidney stones   . History of melanoma excision    June 2016  --- s/p  excision bilateral leg   . Impotence of organic origin   . Lower urinary tract symptoms (LUTS)   . Melanoma of lower leg, right ([place]) 2014  . Mixed hyperlipidemia    slightly  . Multiple renal cysts    bilateral  . PAF (paroxysmal atrial fibrillation) ([place])   . Presence of surgical incision    x2  bilateral leg and right thigh  . RBBB   . Renal cell carcinoma ([facility])    s/p  left partial nephrectomy  . Sleep apnea    no CPAP  . Type 2 diabetes mellitus ([place])    type2  . Wears glasses     Surgeries: Procedure(s): RIGHT TOTAL KNEE ARTHROPLASTY on 04/25/2017   Consultants (if any):   Discharged Condition: Improved  Hospital Course: Matthew Schmitt is an 66 y.o. male who was admitted 04/25/2017 with a diagnosis of <principal problem not specified> and went to the operating room on 04/25/2017 and underwent the above named procedures.    He was given perioperative antibiotics:  Anti-infectives    Start     Dose/Rate Route Frequency Ordered Stop   04/25/17 2200  sulfamethoxazole-trimethoprim (BACTRIM DS,SEPTRA DS) 800-160 MG per tablet 1 tablet  Status:  Discontinued     1 tablet Oral 2 times daily 04/25/17 1621 04/25/17 1627   04/25/17 1830  ceFAZolin (ANCEF) IVPB 2g/100 mL premix     2 g 200 mL/hr over 30 Minutes Intravenous Every 6 hours 04/25/17 1621 04/26/17 0150   04/25/17 1200  ceFAZolin (ANCEF) IVPB 2g/100 mL premix     2 g 200 mL/hr over 30 Minutes Intravenous To [facility] 04/24/17 1137 04/25/17 1240   04/25/17 0000  sulfamethoxazole-trimethoprim (BACTRIM DS,SEPTRA DS) 800-160 MG tablet     1 tablet Oral 2 times daily 04/25/17 1426      .  He was given sequential compression devices, early ambulation, and aspirin for DVT prophylaxis.  He benefited maximally from the hospital stay and there were no complications.    Recent vital signs:  Vitals:   04/27/17 0408 04/27/17 0836  BP: 135/65 (!) 130/47  Pulse: 65 73  Resp: 18 18  Temp: 98.2 F (36.8 C) 97.9 F (36.6 C)    Recent laboratory studies:  Lab Results  Component Value Date   HGB 12.0 (L) 04/26/2017   HGB 14.3 04/19/2017   HGB 12.5 (L) 02/09/2017   Lab Results  Component Value Date   WBC 5.3 04/26/2017   PLT 130 (L) 04/26/2017   Lab Results  Component Value Date   INR 1.07 04/19/2017   Lab Results  Component Value Date   NA 138 04/26/2017   K 3.5 04/26/2017   CL 104 04/26/2017   CO2 29 04/26/2017   BUN 21 (H) 04/26/2017  CREATININE 1.03 04/26/2017   GLUCOSE 122 (H) 04/26/2017    Discharge Medications:   Allergies as of 04/27/2017      Reactions   Actos [pioglitazone] Swelling   SWELLING REACTION UNSPECIFIED  EDEMA      Medication List    TAKE these medications   acetaminophen 500 MG tablet Commonly known as:  TYLENOL Take 1,000 mg by mouth 3 (three) times daily as needed for moderate pain or headache.   amLODipine 5 MG tablet Commonly known as:  NORVASC Take 5 mg by mouth every evening.   aspirin EC 325 MG tablet Take 1 tablet (325 mg total) by mouth 2 (two) times daily. What changed:  See the new instructions.  Another medication with the same name was removed. Continue taking this medication, and follow the directions you see here.   BASAGLAR KWIKPEN 100 UNIT/ML Sopn Inject 2.4 mLs (240 Units total) into the skin every morning. And  pen needles 3/day What changed:  when to take this  additional instructions   BAYER BACK & BODY PAIN EX ST PO Take 2 tablets by mouth 2 (two) times daily as needed (pain).   BENGAY EX Apply 1 application topically daily as needed (pain).   docusate sodium 100 MG capsule Commonly known as:  COLACE Take 1 capsule (100 mg total) by mouth 2 (two) times daily.   doxazosin 8 MG tablet Commonly known as:  CARDURA Take 8 mg by mouth at bedtime.   methocarbamol 750 MG tablet Commonly known as:  ROBAXIN Take 1 tablet (750 mg total) by mouth 2 (two) times daily as needed for muscle spasms.   metoprolol tartrate 50 MG tablet Commonly known as:  LOPRESSOR Take 50 mg by mouth 2 (two) times daily.   ondansetron 4 MG tablet Commonly known as:  ZOFRAN Take 1-2 tablets (4-8 mg total) by mouth every 8 (eight) hours as needed for nausea or vomiting.   ONE TOUCH ULTRA TEST test strip Generic drug:  glucose blood USE 1 EACH 2 TIMES DAILY                              .   oxyCODONE 5 MG immediate release tablet Commonly known as:  Oxy IR/ROXICODONE Take 1-3 tablets (5-15 mg total) by mouth every 4 (four) hours as needed. What changed:  how much to take  when to take this  reasons to take this   oxyCODONE 10 mg 12 hr tablet Commonly known as:  OXYCONTIN Take 1 tablet (10 mg total) by mouth every 12 (twelve) hours. What changed:  You were already taking a medication with the same name, and this prescription was added. Make sure you understand how and when to take each.   oxyCODONE 10 mg 12 hr tablet Commonly known as:  OXYCONTIN Take 1 tablet (10 mg total) by mouth every 12 (twelve) hours. What changed:  Another medication with the same name was added. Make sure you understand how and when to take each.  Another medication with the same name was changed. Make sure you understand how and when to take each.   potassium citrate 10 MEQ (1080 MG) SR tablet Commonly known as:   UROCIT-K Take 2 tablets (20 mEq total) by mouth 2 (two) times daily. What changed:  when to take this   pravastatin 40 MG tablet Commonly known as:  PRAVACHOL Take 40 mg by mouth every evening.   promethazine 25 MG tablet  Commonly known as:  PHENERGAN Take 1 tablet (25 mg total) by mouth every 6 (six) hours as needed for nausea.   senna-docusate 8.6-50 MG tablet Commonly known as:  SENOKOT S Take 1 tablet by mouth at bedtime as needed.   sulfamethoxazole-trimethoprim 800-160 MG tablet Commonly known as:  BACTRIM DS,SEPTRA DS Take 1 tablet by mouth 2 (two) times daily.   valsartan-hydrochlorothiazide 320-25 MG tablet Commonly known as:  DIOVAN-HCT Take 1 tablet by mouth every evening.            Durable Medical Equipment        Start     Ordered   04/25/17 1622  DME Walker rolling  Once    Question:  Patient needs a walker to treat with the following condition  Answer:  Total knee replacement status   04/25/17 1621   04/25/17 1622  DME 3 n 1  Once     04/25/17 1621   04/25/17 1622  DME Bedside commode  Once    Question:  Patient needs a bedside commode to treat with the following condition  Answer:  Total knee replacement status   04/25/17 1621      Diagnostic Studies: Dg Knee Right Port  Result Date: 04/25/2017 CLINICAL DATA:  Status post right total knee joint replacement today. EXAM: PORTABLE RIGHT KNEE - 1-2 VIEW COMPARISON:  Right knee series of Mar 29, 2017 FINDINGS: The prosthetic components are in reasonable position radiographically. The interface with the native bone appears normal. No acute native bone abnormality is observed. There is a small amount of fluid and air in the anterior aspect of the joint space. IMPRESSION: There is no immediate postprocedure complication following right knee joint prosthesis placement. Electronically Signed   By: Jaekwon  Martinique M.D.   On: 04/25/2017 15:31   Xr Knee 1-2 Views Left  Result Date: 03/29/2017 Stable total knee  replacement in good alignment.    Disposition: 06-Home-Health Care Svc  Discharge Instructions    Call MD / Call 911    Complete by:  As directed    If you experience chest pain or shortness of breath, CALL 911 and be transported to the hospital emergency room.  If you develope a fever above 101 F, pus (white drainage) or increased drainage or redness at the wound, or calf pain, call your surgeon's office.   Call MD / Call 911    Complete by:  As directed    If you experience chest pain or shortness of breath, CALL 911 and be transported to the hospital emergency room.  If you develope a fever above 101.5 F, pus (white drainage) or increased drainage or redness at the wound, or calf pain, call your surgeon's office.   Constipation Prevention    Complete by:  As directed    Drink plenty of fluids.  Prune juice may be helpful.  You may use a stool softener, such as Colace (over the counter) 100 mg twice a day.  Use MiraLax (over the counter) for constipation as needed.   Constipation Prevention    Complete by:  As directed    Drink plenty of fluids.  Prune juice may be helpful.  You may use a stool softener, such as Colace (over the counter) 100 mg twice a day.  Use MiraLax (over the counter) for constipation as needed.   Diet Carb Modified    Complete by:  As directed    Discharge instructions    Complete by:  As directed  INSTRUCTIONS AFTER JOINT REPLACEMENT   Remove items at home which could result in a fall. This includes throw rugs or furniture in walking pathways ICE to the affected joint every three hours while awake for 30 minutes at a time, for at least the first 3-5 days, and then as needed for pain and swelling.  Continue to use ice for pain and swelling. You may notice swelling that will progress down to the foot and ankle.  This is normal after surgery.  Elevate your leg when you are not up walking on it.   Continue to use the breathing machine you got in the hospital  (incentive spirometer) which will help keep your temperature down.  It is common for your temperature to cycle up and down following surgery, especially at night when you are not up moving around and exerting yourself.  The breathing machine keeps your lungs expanded and your temperature down.   DIET:  As you were doing prior to hospitalization, we recommend a well-balanced diet.  DRESSING / WOUND CARE / SHOWERING  You may change your surgical dressing 5 days after surgery.  Then change the dressing every day with sterile gauze.  Please use good hand washing techniques before changing the dressing.  Do not use any lotions or creams on the incision until instructed by your surgeon.  You may shower while you have the surgical dressing which is waterproof.  After removal of surgical dressing, you must cover the incision when showering.  ACTIVITY  Increase activity slowly as tolerated, but follow the weight bearing instructions below.   No driving for 6 weeks or until further direction given by your physician.  You cannot drive while taking narcotics.  No lifting or carrying greater than 10 lbs. until further directed by your surgeon. Avoid periods of inactivity such as sitting longer than an hour when not asleep. This helps prevent blood clots.  You may return to work once you are authorized by your doctor.     WEIGHT BEARING   Weight bearing as tolerated with assist device (walker, cane, etc) as directed, use it as long as suggested by your surgeon or therapist, typically at least 4-6 weeks.   EXERCISES  Results after joint replacement surgery are often greatly improved when you follow the exercise, range of motion and muscle strengthening exercises prescribed by your doctor. Safety measures are also important to protect the joint from further injury. Any time any of these exercises cause you to have increased pain or swelling, decrease what you are doing until you are comfortable again and  then slowly increase them. If you have problems or questions, call your caregiver or physical therapist for advice.   Rehabilitation is important following a joint replacement. After just a few days of immobilization, the muscles of the leg can become weakened and shrink (atrophy).  These exercises are designed to build up the tone and strength of the thigh and leg muscles and to improve motion. Often times heat used for twenty to thirty minutes before working out will loosen up your tissues and help with improving the range of motion but do not use heat for the first two weeks following surgery (sometimes heat can increase post-operative swelling).   These exercises can be done on a training (exercise) mat, on the floor, on a table or on a bed. Use whatever works the best and is most comfortable for you.    Use music or television while you are exercising so that the exercises  are a pleasant break in your day. This will make your life better with the exercises acting as a break in your routine that you can look forward to.   Perform all exercises about fifteen times, three times per day or as directed.  You should exercise both the operative leg and the other leg as well.  Exercises include:   Quad Sets - Tighten up the muscle on the front of the thigh (Quad) and hold for 5-10 seconds.   Straight Leg Raises - With your knee straight (if you were given a brace, keep it on), lift the leg to 60 degrees, hold for 3 seconds, and slowly lower the leg.  Perform this exercise against resistance later as your leg gets stronger.  Leg Slides: Lying on your back, slowly slide your foot toward your buttocks, bending your knee up off the floor (only go as far as is comfortable). Then slowly slide your foot back down until your leg is flat on the floor again.  Angel Wings: Lying on your back spread your legs to the side as far apart as you can without causing discomfort.  Hamstring Strength:  Lying on your back, push  your heel against the floor with your leg straight by tightening up the muscles of your buttocks.  Repeat, but this time bend your knee to a comfortable angle, and push your heel against the floor.  You may put a pillow under the heel to make it more comfortable if necessary.   A rehabilitation program following joint replacement surgery can speed recovery and prevent re-injury in the future due to weakened muscles. Contact your doctor or a physical therapist for more information on knee rehabilitation.    CONSTIPATION  Constipation is defined medically as fewer than three stools per week and severe constipation as less than one stool per week.  Even if you have a regular bowel pattern at home, your normal regimen is likely to be disrupted due to multiple reasons following surgery.  Combination of anesthesia, postoperative narcotics, change in appetite and fluid intake all can affect your bowels.   YOU MUST use at least one of the following options; they are listed in order of increasing strength to get the job done.  They are all available over the counter, and you may need to use some, POSSIBLY even all of these options:    Drink plenty of fluids (prune juice may be helpful) and high fiber foods Colace 100 mg by mouth twice a day  Senokot for constipation as directed and as needed Dulcolax (bisacodyl), take with full glass of water  Miralax (polyethylene glycol) once or twice a day as needed.  If you have tried all these things and are unable to have a bowel movement in the first 3-4 days after surgery call either your surgeon or your primary doctor.    If you experience loose stools or diarrhea, hold the medications until you stool forms back up.  If your symptoms do not get better within 1 week or if they get worse, check with your doctor.  If you experience "the worst abdominal pain ever" or develop nausea or vomiting, please contact the office immediately for further recommendations for  treatment.   ITCHING:  If you experience itching with your medications, try taking only a single pain pill, or even half a pain pill at a time.  You can also use Benadryl over the counter for itching or also to help with sleep.   TED HOSE  STOCKINGS:  Use stockings on both legs until for at least 2 weeks or as directed by physician office. They may be removed at night for sleeping.  MEDICATIONS:  See your medication summary on the "After Visit Summary" that nursing will review with you.  You may have some home medications which will be placed on hold until you complete the course of blood thinner medication.  It is important for you to complete the blood thinner medication as prescribed.  PRECAUTIONS:  If you experience chest pain or shortness of breath - call 911 immediately for transfer to the hospital emergency department.   If you develop a fever greater that 101 F, purulent drainage from wound, increased redness or drainage from wound, foul odor from the wound/dressing, or calf pain - CONTACT YOUR SURGEON.                                                   FOLLOW-UP APPOINTMENTS:  If you do not already have a post-op appointment, please call the office for an appointment to be seen by your surgeon.  Guidelines for how soon to be seen are listed in your "After Visit Summary", but are typically between 1-4 weeks after surgery.  OTHER INSTRUCTIONS:   Knee Replacement:  Do not place pillow under knee, focus on keeping the knee straight while resting. CPM instructions: 0-90 degrees, 2 hours in the morning, 2 hours in the afternoon, and 2 hours in the evening. Place foam block, curve side up under heel at all times except when in CPM or when walking.  DO NOT modify, tear, cut, or change the foam block in any way.  MAKE SURE YOU:  Understand these instructions.  Get help right away if you are not doing well or get worse.    Thank you for letting us be a part of your medical care team.  It is a  privilege we respect greatly.  We hope these instructions will help you stay on track for a fast and full recovery!   Driving restrictions    Complete by:  As directed    No driving for 6 weeks   Driving restrictions    Complete by:  As directed    No driving while taking narcotic pain meds.   Increase activity slowly as tolerated    Complete by:  As directed    Increase activity slowly as tolerated    Complete by:  As directed    Lifting restrictions    Complete by:  As directed    No lifting for 6 weeks      Follow-up Information    Leandrew Koyanagi, MD Follow up in 2 week(s).   Specialty:  Orthopedic Surgery Why:  For suture removal, For wound re-check Contact information: [place]  [id number] [phone]            Signed: Eduard Roux 04/27/2017, 9:24 AM

## 2017-04-27 NOTE — Progress Notes (Signed)
     Subjective: 2 Days Post-Op Procedure(s) (LRB): RIGHT TOTAL KNEE ARTHROPLASTY (Right) Awake, alert and oriented x4. Iv right arm, no complaints.Pain less than with previous left TKR. Patient reports pain as mild.    Objective:   VITALS:  Temp:  [98.2 F (36.8 C)-98.8 F (37.1 C)] 98.2 F (36.8 C) (06/09 0408) Pulse Rate:  [64-76] 65 (06/09 0408) Resp:  [16-18] 18 (06/09 0408) BP: (135-156)/(65-68) 135/65 (06/09 0408) SpO2:  [95 %-96 %] 96 % (06/09 0408)  Neurologically intact ABD soft Neurovascular intact Sensation intact distally Intact pulses distally Dorsiflexion/Plantar flexion intact Incision: dressing C/D/I   LABS  Recent Labs  04/26/17 0737  HGB 12.0*  WBC 5.3  PLT 130*    Recent Labs  04/26/17 0737  NA 138  K 3.5  CL 104  CO2 29  BUN 21*  CREATININE 1.03  GLUCOSE 122*   No results for input(s): LABPT, INR in the last 72 hours.   Assessment/Plan: 2 Days Post-Op Procedure(s) (LRB): RIGHT TOTAL KNEE ARTHROPLASTY (Right)  Advance diet Up with therapy D/C IV fluids Discharge home with home health Requests longer period of Meds for pain change 12 hour oxycontin to14 days #28then d/c Jessy Oto 04/27/2017, 8:03 AM Patient ID: Matthew Schmitt, male   DOB: 10-Nov-1951, 66 y.o.   MRN: [id number]

## 2017-04-27 NOTE — Evaluation (Signed)
Occupational Therapy Evaluation Patient Details Name: Matthew Schmitt MRN: [id number] DOB: 04/20/1951 Today's Date: 04/27/2017    History of Present Illness Pt is a 66 yo male s/p R TKA (04/25/17). PMH significant for L TKA anxiety, CAD, HTN, Heart murmur, and  A-fib.    Clinical Impression   PTA, pt was living with his wife and was independent and working in [employer]. Currently, pt performs ADLs and functional mobility at a Mod I level with increased time and RW. Provided education on LB ADLs, toilet transfer, and tub transfer with shower seat; pt demonstrated understanding. Answered all pt questions. Recommend dc home once medically stable per physician. All acute OT needs met and will sign off. Thank you.     Follow Up Recommendations  DC plan and follow up therapy as arranged by surgeon;No OT follow up;Supervision/Assistance - 24 hour    Equipment Recommendations  None recommended by OT    Recommendations for Other Services       Precautions / Restrictions Precautions Precautions: Knee Precaution Booklet Issued: Yes (comment) Precaution Comments: Reviewed resting with knee in extension Restrictions Weight Bearing Restrictions: Yes RLE Weight Bearing: Weight bearing as tolerated      Mobility Bed Mobility               General bed mobility comments: Up in room finishing with PT upon arrival  Transfers Overall transfer level: Needs assistance Equipment used: Rolling walker (2 wheeled) Transfers: Sit to/from Stand Sit to Stand: Supervision         General transfer comment: supervision for safety    Balance Overall balance assessment: Needs assistance Sitting-balance support: Feet supported;No upper extremity supported Sitting balance-Leahy Scale: Good     Standing balance support: Bilateral upper extremity supported;No upper extremity supported;Single extremity supported Standing balance-Leahy Scale: Good Standing balance comment: Pt pretty stable even with  his hands off of RW.                            ADL either performed or assessed with clinical judgement   ADL Overall ADL's : Modified independent                                       General ADL Comments: Pt performing ADLs and fucntional mobility at Mod I level with increased time. Provided education on toilet transfer, shower transfer, and LB dressing. Pt adamant about current ADL techniques. Educated on safe techniques and pt demonstrated understanding.     Vision         Perception     Praxis      Pertinent Vitals/Pain Pain Assessment: Faces Faces Pain Scale: Hurts a little bit Pain Location: right knee Pain Descriptors / Indicators: Operative site guarding;Sore Pain Intervention(s): Monitored during session     Hand Dominance Right   Extremity/Trunk Assessment Upper Extremity Assessment Upper Extremity Assessment: Overall WFL for tasks assessed   Lower Extremity Assessment Lower Extremity Assessment: Defer to PT evaluation RLE Deficits / Details: -8 to 65 degrees R knee flexion   Cervical / Trunk Assessment Cervical / Trunk Assessment: Normal   Communication Communication Communication: No difficulties   Cognition Arousal/Alertness: Awake/alert Behavior During Therapy: WFL for tasks assessed/performed Overall Cognitive Status: Within Functional Limits for tasks assessed  General Comments  Pt very eager to head home. States he wants to wash up with nursing - notified NT    Exercises     Shoulder Instructions      Home Living Family/patient expects to be discharged to:: Private residence Living Arrangements: Spouse/significant other Available Help at Discharge: Family;Available 24 hours/day Type of Home: House Home Access: Stairs to enter [employer] of Steps: 2 Entrance Stairs-Rails: None Home Layout: Two level;Bed/bath upstairs;Able to live on main level  with bedroom/bathroom Alternate Level Stairs-Number of Steps: 15-16 Alternate Level Stairs-Rails: Right Bathroom Shower/Tub: Occupational psychologist: Standard     Home Equipment: Environmental consultant - 2 wheels;Bedside commode;Shower seat - built in;Hand held shower head          Prior Functioning/Environment Level of Independence: Independent        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        OT Problem List: Decreased strength;Decreased range of motion;Decreased activity tolerance;Decreased safety awareness;Decreased knowledge of use of DME or AE;Decreased knowledge of precautions;Pain      OT Treatment/Interventions:      OT Goals(Current goals can be found in the care plan section) Acute Rehab OT Goals Patient Stated Goal: "My goal is to play tennis by the end of this year." OT Goal Formulation: With patient Time For Goal Achievement: 05/11/17 Potential to Achieve Goals: Good  OT Frequency:     Barriers to D/C:            Co-evaluation              AM-PAC PT "6 Clicks" Daily Activity     Outcome Measure Help from another person eating meals?: None Help from another person taking care of personal grooming?: None Help from another person toileting, which includes using toliet, bedpan, or urinal?: A Little Help from another person bathing (including washing, rinsing, drying)?: A Little Help from another person to put on and taking off regular upper body clothing?: A Little Help from another person to put on and taking off regular lower body clothing?: None 6 Click Score: 21   End of Session Equipment Utilized During Treatment: Rolling walker CPM Right Knee CPM Right Knee: On Right Knee Flexion (Degrees): 80 Right Knee Extension (Degrees): 0 Nurse Communication: Mobility status;Other (comment);Weight bearing status (Pt declined to sit and wants NT)  Activity Tolerance: Patient tolerated treatment well Patient left: Other  (comment) (Pt declined to sit stating he wanted to bath with NT - notified NT.)  OT Visit Diagnosis: Unsteadiness on feet (R26.81);Other abnormalities of gait and mobility (R26.89);Muscle weakness (generalized) (M62.81);Pain Pain - Right/Left: Right Pain - part of body: Knee                Time: 5726-2035 OT Time Calculation (min): 15 min Charges:  OT General Charges $OT Visit: 1 Procedure OT Evaluation $OT Eval Low Complexity: 1 Procedure G-Codes:     Ezequiel Macauley MSOT, OTR/L Acute Rehab Pager: [phone] Office: [place] 04/27/2017, 9:27 AM

## 2017-04-29 ENCOUNTER — Telehealth (INDEPENDENT_AMBULATORY_CARE_PROVIDER_SITE_OTHER): Payer: Self-pay | Admitting: Orthopaedic Surgery

## 2017-04-29 NOTE — Telephone Encounter (Signed)
yes

## 2017-04-29 NOTE — Telephone Encounter (Signed)
Called Matthew Schmitt to advise on message.Marland Kitchen

## 2017-04-29 NOTE — Telephone Encounter (Signed)
Dorian called asking for verbal orders of 3 week 2, CB # [phone]

## 2017-04-29 NOTE — Telephone Encounter (Signed)
See message below °

## 2017-05-10 ENCOUNTER — Ambulatory Visit (INDEPENDENT_AMBULATORY_CARE_PROVIDER_SITE_OTHER): Payer: Self-pay | Admitting: Orthopaedic Surgery

## 2017-05-10 ENCOUNTER — Encounter (INDEPENDENT_AMBULATORY_CARE_PROVIDER_SITE_OTHER): Payer: Self-pay | Admitting: Orthopaedic Surgery

## 2017-05-10 DIAGNOSIS — M1712 Unilateral primary osteoarthritis, left knee: Secondary | ICD-10-CM

## 2017-05-10 NOTE — Progress Notes (Signed)
Patient is 2 weeks status post right total knee replacement. He is doing well. The incision is healed. There is no drainage. No signs of infection. Range of motion is 0-102. He has expected postoperative swelling. At this point continue with aspirin. May begin showering. Begin outpatient physical therapy. Follow-up in 4 weeks with 2 view x-rays of the right knee.

## 2017-05-14 ENCOUNTER — Telehealth (INDEPENDENT_AMBULATORY_CARE_PROVIDER_SITE_OTHER): Payer: Self-pay

## 2017-05-14 MED ORDER — OXYCODONE HCL 5 MG PO TABS: ORAL_TABLET | ORAL | 0 refills | Status: DC

## 2017-05-14 NOTE — Telephone Encounter (Signed)
Patient would like a Rx refill for Oxycodone 5mg .  CB# is [phone].

## 2017-05-14 NOTE — Telephone Encounter (Signed)
Please advise 

## 2017-05-14 NOTE — Addendum Note (Signed)
Addended byLaurann Montana on: 05/14/2017 02:39 PM   Modules accepted: Orders

## 2017-05-14 NOTE — Telephone Encounter (Signed)
IC advised could pick up at front desk.  

## 2017-05-14 NOTE — Telephone Encounter (Signed)
30

## 2017-05-19 NOTE — Anesthesia Postprocedure Evaluation (Signed)
Anesthesia Post Note  Patient: Matthew Schmitt  Procedure(s) Performed: Procedure(s) (LRB): RIGHT TOTAL KNEE ARTHROPLASTY (Right)     Patient location during evaluation: PACU Anesthesia Type: Spinal Level of consciousness: oriented and awake and alert Pain management: pain level controlled Vital Signs Assessment: post-procedure vital signs reviewed and stable Respiratory status: spontaneous breathing, respiratory function stable and patient connected to nasal cannula oxygen Cardiovascular status: blood pressure returned to baseline and stable Postop Assessment: no headache and no backache Anesthetic complications: no    Last Vitals:  Vitals:   04/27/17 0408 04/27/17 0836  BP: 135/65 (!) 130/47  Pulse: 65 73  Resp: 18 18  Temp: 36.8 C 36.6 C    Last Pain:  Vitals:   04/27/17 0938  TempSrc:   PainSc: 0-No pain                 Traniece Boffa,JAMES TERRILL

## 2017-05-24 NOTE — Addendum Note (Signed)
Addendum  created 05/24/17 0751 by Rica Koyanagi, MD   Sign clinical note

## 2017-05-24 NOTE — Anesthesia Postprocedure Evaluation (Signed)
Anesthesia Post Note  Patient: Matthew Schmitt  Procedure(s) Performed: Procedure(s) (LRB): RIGHT TOTAL KNEE ARTHROPLASTY (Right)     Anesthesia Type: Spinal    Last Vitals:  Vitals:   04/27/17 0408 04/27/17 0836  BP: 135/65 (!) 130/47  Pulse: 65 73  Resp: 18 18  Temp: 36.8 C 36.6 C    Last Pain:  Vitals:   04/27/17 0938  TempSrc:   PainSc: 0-No pain                 Kristan Votta,JAMES TERRILL

## 2017-05-29 ENCOUNTER — Encounter: Payer: Self-pay | Admitting: Endocrinology

## 2017-05-29 ENCOUNTER — Ambulatory Visit (INDEPENDENT_AMBULATORY_CARE_PROVIDER_SITE_OTHER): Payer: Managed Care, Other (non HMO) | Admitting: Endocrinology

## 2017-05-29 VITALS — BP 122/70 | HR 75 | Ht 71.0 in | Wt 240.0 lb

## 2017-05-29 DIAGNOSIS — E1159 Type 2 diabetes mellitus with other circulatory complications: Secondary | ICD-10-CM | POA: Diagnosis not present

## 2017-05-29 DIAGNOSIS — Z794 Long term (current) use of insulin: Secondary | ICD-10-CM

## 2017-05-29 DIAGNOSIS — E0821 Diabetes mellitus due to underlying condition with diabetic nephropathy: Secondary | ICD-10-CM

## 2017-05-29 LAB — POCT GLYCOSYLATED HEMOGLOBIN (HGB A1C): Hemoglobin A1C: 6.4

## 2017-05-29 MED ORDER — BASAGLAR KWIKPEN 100 UNIT/ML ~~LOC~~ SOPN: 200.0000 [IU] | PEN_INJECTOR | Freq: Every day | SUBCUTANEOUS | 3 refills | Status: DC

## 2017-05-29 NOTE — Patient Instructions (Addendum)
check your blood sugar once a day.  vary the time of day when you check, between before the 3 meals, and at bedtime.  also check if you have symptoms of your blood sugar being too high or too low.  please keep a record of the readings and bring it to your next appointment here.  please call us sooner if you are having low blood sugar episodes, or if it is running above 200.  On this type of insulin schedule, you should eat meals on a regular schedule.  If a meal is missed or significantly delayed, your blood sugar could go low.   Please reduce the insulin to 200 units each morning.   Please continue your weight loss efforts.   Please come back for a follow-up appointment in 3 months.

## 2017-05-29 NOTE — Progress Notes (Signed)
Subjective:    Patient ID: Matthew Schmitt, male    DOB: Oct 06, 1951, 66 y.o.   MRN: [id number]  HPI Pt returns for f/u of diabetes mellitus: DM type: Insulin-requiring type 2. Dx'ed: 2002.   Complications: nephropathy, leg ulcers, and CAD.  Therapy: insulin since 2015.   DKA: never.   Severe hypoglycemia: once, in 2017.  Pancreatitis: never.   Other: he declines multiple daily injections; he has severe insulin resistance.  In early 2017, he requested to change levemir to lantus, due to high dosage requirement.  Interval history: no cbg record, but states cbg's vary from 102-289.  It is in general higher as the day goes on.  He has lost 21 lbs since last ov.  He skips insulin once per week, due to cbg of 100 fasting.  Past Medical History:  Diagnosis Date  . Anxiety   . Arthritis    everywhere  . Bladder calculus   . Coronary artery disease    cardiologist-  dr Wynonia Lawman  . Essential hypertension, benign   . First degree heart block   . Heart murmur   . History of kidney stones   . History of melanoma excision    June 2016  --- s/p  excision bilateral leg   . Impotence of organic origin   . Lower urinary tract symptoms (LUTS)   . Melanoma of lower leg, right ([place]) 2014  . Mixed hyperlipidemia    slightly  . Multiple renal cysts    bilateral  . PAF (paroxysmal atrial fibrillation) ([place])   . Presence of surgical incision    x2  bilateral leg and right thigh  . RBBB   . Renal cell carcinoma ([facility])    s/p  left partial nephrectomy  . Sleep apnea    no CPAP  . Type 2 diabetes mellitus ([place])    type2  . Wears glasses     Past Surgical History:  Procedure Laterality Date  . CARDIAC CATHETERIZATION  08-17-2011  DR SPENCER TILLEY   POSSIBLE MODERATE LAD STENOSIS JUST AFTER THE BIFURCATION OF LARGE DIAGONAL BRANCH/ LVEF  40-45%/ MILD GLOBAL HYPODINESIS (CATH DONE FOR ABNORMAL STRESS TEST OF FIXED LATERAL WALL DEFECT & BIFASCICULAR BLAOCK)  . CARDIOVERSION N/A 04/02/2013   Procedure: CARDIOVERSION;  Surgeon: Jacolyn Reedy, MD;  Location: [facility];  Service: Cardiovascular;  Laterality: N/A;  . CYSTOSCOPY WITH LITHOLAPAXY N/A 05/18/2015   Procedure: CYSTOSCOPY WITH LITHOLAPAXY;  Surgeon: Rana Snare, MD;  Location: [facility];  Service: Urology;  Laterality: N/A;  . CYSTOSCOPY WITH LITHOLAPAXY N/A 01/04/2017   Procedure: CYSTOSCOPY WITH LITHOLAPAXY, Holmium Laser;  Surgeon: Ardis Hughs, MD;  Location: [facility];  Service: Urology;  Laterality: N/A;  . CYSTOSCOPY WITH RETROGRADE PYELOGRAM, URETEROSCOPY AND STENT PLACEMENT  11/03/2012   Procedure: CYSTOSCOPY WITH RETROGRADE PYELOGRAM, URETEROSCOPY AND STENT PLACEMENT;  Surgeon: Bernestine Amass, MD;  Location: [facility];  Service: Urology;  Laterality: Left;  Cystoscopy/Left Retrograde/Ureteroscopy/Holmium Laser Litho/Double J Stent  . HOLMIUM LASER APPLICATION  56/81/2751   Procedure: HOLMIUM LASER APPLICATION;  Surgeon: Bernestine Amass, MD;  Location: [facility];  Service: Urology;  Laterality: Left;  . HOLMIUM LASER APPLICATION N/A 7/00/1749   Procedure: HOLMIUM LASER APPLICATION;  Surgeon: Rana Snare, MD;  Location: [facility];  Service: Urology;  Laterality: N/A;  . JOINT REPLACEMENT    . KNEE SURGERY Bilateral 1978  &  1974  . MELANOMA EXCISION Bilateral June 2016  20th-- left leg //  1st-- right leg w/ skin graft from right thigh (no lymph node bx)  . NEPHROLITHOTOMY Right 05/17/2014   Procedure: NEPHROLITHOTOMY PERCUTANEOUS;  Surgeon: Bernestine Amass, MD;  Location: [facility];  Service: Urology;  Laterality: Right;  . PILONIDAL CYST EXCISION    . ROBOTIC ASSITED PARTIAL NEPHRECTOMY Left 06/18/2013   Procedure: ROBOTIC ASSISTED PARTIAL NEPHRECTOMY;  Surgeon: Dutch Gray, MD;  Location: [facility];  Service: Urology;  Laterality: Left;  . ROTATOR CUFF REPAIR Left 07/2016  . TONSILLECTOMY  61  (age 41)  . TOTAL KNEE ARTHROPLASTY Left 02/07/2017    Procedure: LEFT TOTAL KNEE ARTHROPLASTY;  Surgeon: Leandrew Koyanagi, MD;  Location: [facility];  Service: Orthopedics;  Laterality: Left;  . TOTAL KNEE ARTHROPLASTY Right 04/25/2017  . TOTAL KNEE ARTHROPLASTY Right 04/25/2017   Procedure: RIGHT TOTAL KNEE ARTHROPLASTY;  Surgeon: Leandrew Koyanagi, MD;  Location: [facility];  Service: Orthopedics;  Laterality: Right;  . TRANSURETHRAL RESECTION OF PROSTATE N/A 01/04/2017   Procedure: TRANSURETHRAL RESECTION OF THE PROSTATE (TURP);  Surgeon: Ardis Hughs, MD;  Location: [facility];  Service: Urology;  Laterality: N/A;    Social History   Social History  . Marital status: Married    Spouse name: N/A  . Number of children: 2  . Years of education: 16   Occupational History  . Government social research officer, Psychologist, occupational    Social History Main Topics  . Smoking status: Former Smoker    Packs/day: 1.00    Years: 6.00    Types: Cigarettes    Quit date: 10/31/1969  . Smokeless tobacco: Never Used  . Alcohol use 0.0 oz/week     Comment: OCCASIONAL  . Drug use: No  . Sexual activity: No   Other Topics Concern  . Not on file   Social History Narrative   Regular exercise-no    Current Outpatient Prescriptions on File Prior to Visit  Medication Sig Dispense Refill  . acetaminophen (TYLENOL) 500 MG tablet Take 1,000 mg by mouth 3 (three) times daily as needed for moderate pain or headache.     Marland Kitchen amLODipine (NORVASC) 5 MG tablet Take 5 mg by mouth every evening.     . docusate sodium (COLACE) 100 MG capsule Take 1 capsule (100 mg total) by mouth 2 (two) times daily. 60 capsule 0  . Menthol, Topical Analgesic, (BENGAY EX) Apply 1 application topically daily as needed (pain).    . methocarbamol (ROBAXIN) 750 MG tablet Take 1 tablet (750 mg total) by mouth 2 (two) times daily as needed for muscle spasms. 60 tablet 0  . metoprolol (LOPRESSOR) 50 MG tablet Take 50 mg by mouth 2 (two) times daily.     . ONE TOUCH ULTRA TEST test strip USE 1 EACH 2 TIMES DAILY                               . 150 each 10  . oxyCODONE (OXYCONTIN) 10 mg 12 hr tablet Take 1 tablet (10 mg total) by mouth every 12 (twelve) hours. 10 tablet 0  . oxyCODONE (OXYCONTIN) 10 mg 12 hr tablet Take 1 tablet (10 mg total) by mouth every 12 (twelve) hours. 28 tablet 0  . potassium citrate (UROCIT-K) 10 MEQ (1080 MG) SR tablet Take 2 tablets (20 mEq total) by mouth 2 (two) times daily. (Patient taking differently: Take 20 mEq by mouth every evening. ) 120 tablet 60  .  pravastatin (PRAVACHOL) 40 MG tablet Take 40 mg by mouth every evening.     . valsartan-hydrochlorothiazide (DIOVAN-HCT) 320-25 MG per tablet Take 1 tablet by mouth every evening.      No current facility-administered medications on file prior to visit.     Allergies  Allergen Reactions  . Actos [Pioglitazone] Swelling    SWELLING REACTION UNSPECIFIED  EDEMA    Family History  Problem Relation Age of Onset  . Arthritis Father   . Hypertension Father   . Diabetes Father   . Arthritis Mother   . Hypertension Mother   . Heart attack Brother 23  . Diabetes Brother   . Lymphoma Paternal Grandfather   . Cancer Maternal Uncle   . Cancer Paternal Uncle        type unknown    BP 122/70   Pulse 75   Ht 5\' 11"  (1.803 m)   Wt 240 lb (108.9 kg)   SpO2 96%   BMI 33.47 kg/m    Review of Systems He denies hypoglycemia    Objective:   Physical Exam VITAL SIGNS:  See vs page.  GENERAL: no distress.  Pulses: dorsalis pedis intact bilaterally Feet: no deformity. feet are of normal color and temp. There are healed leg ulcers, and severe hyperpigmentation of the ant tibial areas. The left posterior tibial area has a healed ulcer. There is severe bilateral onychomycosis of the toenails.  CV: trace bilat leg edema.  Skin: no ulceron the feet.  Neuro: sensation is intact to touch on the feet.  A1c=6.4%    Assessment & Plan:  Insulin-requiring type 2 DM, with CAD: overcontrolled.   Weight loss, intentional.     Patient Instructions  check your blood sugar once a day.  vary the time of day when you check, between before the 3 meals, and at bedtime.  also check if you have symptoms of your blood sugar being too high or too low.  please keep a record of the readings and bring it to your next appointment here.  please call us sooner if you are having low blood sugar episodes, or if it is running above 200.  On this type of insulin schedule, you should eat meals on a regular schedule.  If a meal is missed or significantly delayed, your blood sugar could go low.   Please reduce the insulin to 200 units each morning.   Please continue your weight loss efforts.   Please come back for a follow-up appointment in 3 months.

## 2017-06-07 ENCOUNTER — Ambulatory Visit (INDEPENDENT_AMBULATORY_CARE_PROVIDER_SITE_OTHER): Payer: Self-pay | Admitting: Orthopaedic Surgery

## 2017-06-07 ENCOUNTER — Encounter (INDEPENDENT_AMBULATORY_CARE_PROVIDER_SITE_OTHER): Payer: Self-pay | Admitting: Orthopaedic Surgery

## 2017-06-07 ENCOUNTER — Ambulatory Visit (INDEPENDENT_AMBULATORY_CARE_PROVIDER_SITE_OTHER): Payer: Managed Care, Other (non HMO)

## 2017-06-07 DIAGNOSIS — M1712 Unilateral primary osteoarthritis, left knee: Secondary | ICD-10-CM

## 2017-06-07 NOTE — Progress Notes (Signed)
Patient is 6 weeks status post right total knee replacement. He is working with physical therapy and progressing well with range of motion. Surgical scar is fully healed. No signs of infection. Range of motion is 0-125. Pain is well-controlled. X-ray show stable alignment of his knee replacements. Dental prophylaxis was reinforced. At this point he may discontinue physical therapy and just do home exercises on his own. Increase activity as tolerated. Follow-up in 6 weeks for recheck.

## 2017-07-18 ENCOUNTER — Ambulatory Visit (INDEPENDENT_AMBULATORY_CARE_PROVIDER_SITE_OTHER): Payer: Managed Care, Other (non HMO) | Admitting: Orthopaedic Surgery

## 2017-07-18 DIAGNOSIS — Z96653 Presence of artificial knee joint, bilateral: Secondary | ICD-10-CM

## 2017-07-18 DIAGNOSIS — M1712 Unilateral primary osteoarthritis, left knee: Secondary | ICD-10-CM

## 2017-07-18 NOTE — Progress Notes (Signed)
Patient is 3 months status post right total knee replacement. He is doing well, along. He has finished physical therapy. He is walking and using the elliptical for exercise and strengthening. His surgical scar is fully healed. Overall he is happy. He does endorse some popping that's painless.  His physical exam shows that he still has some atrophy of his quadriceps. He is ambulating without any assist devices. Overall he is coming along well. His range of motion is excellent. I'll see him back in 6 months with repeat two-view x-rays of bilateral knees. Dental antibiotic prophylaxis was reinforced.

## 2017-07-19 ENCOUNTER — Ambulatory Visit (INDEPENDENT_AMBULATORY_CARE_PROVIDER_SITE_OTHER): Payer: Managed Care, Other (non HMO) | Admitting: Orthopaedic Surgery

## 2017-09-04 ENCOUNTER — Other Ambulatory Visit: Payer: Self-pay

## 2017-09-04 ENCOUNTER — Ambulatory Visit (INDEPENDENT_AMBULATORY_CARE_PROVIDER_SITE_OTHER): Payer: 59 | Admitting: Endocrinology

## 2017-09-04 ENCOUNTER — Encounter: Payer: Self-pay | Admitting: Endocrinology

## 2017-09-04 VITALS — BP 168/80 | HR 65 | Wt 249.4 lb

## 2017-09-04 DIAGNOSIS — E1129 Type 2 diabetes mellitus with other diabetic kidney complication: Secondary | ICD-10-CM

## 2017-09-04 DIAGNOSIS — E0821 Diabetes mellitus due to underlying condition with diabetic nephropathy: Secondary | ICD-10-CM

## 2017-09-04 LAB — POCT GLYCOSYLATED HEMOGLOBIN (HGB A1C): Hemoglobin A1C: 8.4

## 2017-09-04 MED ORDER — GLUCOSE BLOOD VI STRP: ORAL_STRIP | 3 refills | Status: DC

## 2017-09-04 MED ORDER — BASAGLAR KWIKPEN 100 UNIT/ML ~~LOC~~ SOPN: 280.0000 [IU] | PEN_INJECTOR | Freq: Every day | SUBCUTANEOUS | 3 refills | Status: DC

## 2017-09-04 NOTE — Progress Notes (Signed)
Subjective:    Patient ID: Matthew Schmitt, male    DOB: 03/27/1951, 66 y.o.   MRN: [id number]  HPI Pt returns for f/u of diabetes mellitus: DM type: Insulin-requiring type 2. Dx'ed: 2002.   Complications: nephropathy, leg ulcers, and CAD.  Therapy: insulin since 2015.   DKA: never.   Severe hypoglycemia: once, in 2017.  Pancreatitis: never.   Other: he declines multiple daily injections; he has severe insulin resistance.  In early 2017, he requested to change levemir to lantus, due to high dosage requirement.  Interval history: He increased basaglar to 240 units qd, due to hyperglycemia.  He checks fasting only.  cbg varies from 165-301.  pt states he feels well in general.  He says he never misses the insulin.   Past Medical History:  Diagnosis Date  . Anxiety   . Arthritis    everywhere  . Bladder calculus   . Coronary artery disease    cardiologist-  dr Wynonia Lawman  . Essential hypertension, benign   . First degree heart block   . Heart murmur   . History of kidney stones   . History of melanoma excision    June 2016  --- s/p  excision bilateral leg   . Impotence of organic origin   . Lower urinary tract symptoms (LUTS)   . Melanoma of lower leg, right ([place]) 2014  . Mixed hyperlipidemia    slightly  . Multiple renal cysts    bilateral  . PAF (paroxysmal atrial fibrillation) ([place])   . Presence of surgical incision    x2  bilateral leg and right thigh  . RBBB   . Renal cell carcinoma (HCC)    s/p  left partial nephrectomy  . Sleep apnea    no CPAP  . Type 2 diabetes mellitus ([place])    type2  . Wears glasses     Past Surgical History:  Procedure Laterality Date  . CARDIAC CATHETERIZATION  08-17-2011  DR SPENCER TILLEY   POSSIBLE MODERATE LAD STENOSIS JUST AFTER THE BIFURCATION OF LARGE DIAGONAL BRANCH/ LVEF  40-45%/ MILD GLOBAL HYPODINESIS (CATH DONE FOR ABNORMAL STRESS TEST OF FIXED LATERAL WALL DEFECT & BIFASCICULAR BLAOCK)  . CARDIOVERSION N/A 04/02/2013   Procedure: CARDIOVERSION;  Surgeon: Jacolyn Reedy, MD;  Location: [place];  Service: Cardiovascular;  Laterality: N/A;  . CYSTOSCOPY WITH LITHOLAPAXY N/A 05/18/2015   Procedure: CYSTOSCOPY WITH LITHOLAPAXY;  Surgeon: Rana Snare, MD;  Location: [facility];  Service: Urology;  Laterality: N/A;  . CYSTOSCOPY WITH LITHOLAPAXY N/A 01/04/2017   Procedure: CYSTOSCOPY WITH LITHOLAPAXY, Holmium Laser;  Surgeon: Ardis Hughs, MD;  Location: [facility];  Service: Urology;  Laterality: N/A;  . CYSTOSCOPY WITH RETROGRADE PYELOGRAM, URETEROSCOPY AND STENT PLACEMENT  11/03/2012   Procedure: CYSTOSCOPY WITH RETROGRADE PYELOGRAM, URETEROSCOPY AND STENT PLACEMENT;  Surgeon: Bernestine Amass, MD;  [facility];  Service: Urology;  Laterality: Left;  Cystoscopy/Left Retrograde/Ureteroscopy/Holmium Laser Litho/Double J Stent  . HOLMIUM LASER APPLICATION  25/36/6440   Procedure: HOLMIUM LASER APPLICATION;  Surgeon: Bernestine Amass, MD;  Location: [facility];  Service: Urology;  Laterality: Left;  . HOLMIUM LASER APPLICATION N/A 3/47/4259   Procedure: HOLMIUM LASER APPLICATION;  Surgeon: Rana Snare, MD;  Location: [facility];  Service: Urology;  Laterality: N/A;  . JOINT REPLACEMENT    . KNEE SURGERY Bilateral 1978  &  1974  . MELANOMA EXCISION Bilateral June 2016   20th-- left leg //  1st-- right leg w/ skin graft from right thigh (no lymph node bx)  . NEPHROLITHOTOMY Right 05/17/2014   Procedure: NEPHROLITHOTOMY PERCUTANEOUS;  Surgeon: Bernestine Amass, MD;  [facility];  Service: Urology;  Laterality: Right;  . PILONIDAL CYST EXCISION    . ROBOTIC ASSITED PARTIAL NEPHRECTOMY Left 06/18/2013   Procedure: ROBOTIC ASSISTED PARTIAL NEPHRECTOMY;  Surgeon: Dutch Gray, MD;  [facility];  Service: Urology;  Laterality: Left;  . ROTATOR CUFF REPAIR Left 07/2016  . TONSILLECTOMY  4  (age 81)  . TOTAL KNEE ARTHROPLASTY Left 02/07/2017    Procedure: LEFT TOTAL KNEE ARTHROPLASTY;  Surgeon: Leandrew Koyanagi, MD;  Location: [place];  Service: Orthopedics;  Laterality: Left;  . TOTAL KNEE ARTHROPLASTY Right 04/25/2017  . TOTAL KNEE ARTHROPLASTY Right 04/25/2017   Procedure: RIGHT TOTAL KNEE ARTHROPLASTY;  Surgeon: Leandrew Koyanagi, MD;  Location: [place];  Service: Orthopedics;  Laterality: Right;  . TRANSURETHRAL RESECTION OF PROSTATE N/A 01/04/2017   Procedure: TRANSURETHRAL RESECTION OF THE PROSTATE (TURP);  Surgeon: Ardis Hughs, MD;  [facility];  Service: Urology;  Laterality: N/A;    Social History   Social History  . Marital status: Married    Spouse name: N/A  . Number of children: 2  . Years of education: 16   Occupational History  . Government social research officer, Psychologist, occupational    Social History Main Topics  . Smoking status: Former Smoker    Packs/day: 1.00    Years: 6.00    Types: Cigarettes    Quit date: 10/31/1969  . Smokeless tobacco: Never Used  . Alcohol use 0.0 oz/week     Comment: OCCASIONAL  . Drug use: No  . Sexual activity: No   Other Topics Concern  . Not on file   Social History Narrative   Regular exercise-no    Current Outpatient Prescriptions on File Prior to Visit  Medication Sig Dispense Refill  . amLODipine (NORVASC) 5 MG tablet Take 5 mg by mouth every evening.     . Menthol, Topical Analgesic, (BENGAY EX) Apply 1 application topically daily as needed (pain).    . metoprolol (LOPRESSOR) 50 MG tablet Take 50 mg by mouth 2 (two) times daily.     . potassium citrate (UROCIT-K) 10 MEQ (1080 MG) SR tablet Take 2 tablets (20 mEq total) by mouth 2 (two) times daily. (Patient taking differently: Take 20 mEq by mouth every evening. ) 120 tablet 60  . pravastatin (PRAVACHOL) 40 MG tablet Take 40 mg by mouth every evening.     . valsartan-hydrochlorothiazide (DIOVAN-HCT) 320-25 MG per tablet Take 1 tablet by mouth every evening.     Marland Kitchen acetaminophen (TYLENOL) 500 MG tablet Take 1,000 mg by mouth 3  (three) times daily as needed for moderate pain or headache.     . docusate sodium (COLACE) 100 MG capsule Take 1 capsule (100 mg total) by mouth 2 (two) times daily. (Patient not taking: Reported on 09/04/2017) 60 capsule 0  . methocarbamol (ROBAXIN) 750 MG tablet Take 1 tablet (750 mg total) by mouth 2 (two) times daily as needed for muscle spasms. (Patient not taking: Reported on 09/04/2017) 60 tablet 0   No current facility-administered medications on file prior to visit.     Allergies  Allergen Reactions  . Actos [Pioglitazone] Swelling    SWELLING REACTION UNSPECIFIED  EDEMA    Family History  Problem Relation Age of Onset  . Arthritis Father   . Hypertension Father   .  Diabetes Father   . Arthritis Mother   . Hypertension Mother   . Heart attack Brother 34  . Diabetes Brother   . Lymphoma Paternal Grandfather   . Cancer Maternal Uncle   . Cancer Paternal Uncle        type unknown    BP (!) 168/80   Pulse 65   Wt 249 lb 6.4 oz (113.1 kg)   SpO2 98%   BMI 34.78 kg/m   Review of Systems He has regained 9 lbs    Objective:   Physical Exam VITAL SIGNS:  See vs page.  GENERAL: no distress.  Pulses: dorsalis pedis intact bilaterally Feet: no deformity. feet are of normal color and temp. There are healed leg ulcers, and severe hyperpigmentation of the ant tibial areas. The left posterior tibial area has a healed ulcer. There is severe bilateral onychomycosis of the toenails.  CV: trace bilat leg edema.  Skin: no ulceron the feet.  Neuro: sensation is intact to touch on the feet.   Lab Results  Component Value Date   HGBA1C 8.4 09/04/2017      Assessment & Plan:  Insulin-requiring type 2 DM, with CAD: worse.  Obesity: worse.   HTN: recheck next time.    Patient Instructions  check your blood sugar once a day.  vary the time of day when you check, between before the 3 meals, and at bedtime.  also check if you have symptoms of your blood sugar being too  high or too low.  please keep a record of the readings and bring it to your next appointment here.  please call us sooner if you are having low blood sugar episodes, or if it is running above 200.  On this type of insulin schedule, you should eat meals on a regular schedule.  If a meal is missed or significantly delayed, your blood sugar could go low.   Please increase the insulin to 280 units each morning.   Please continue your weight loss efforts.   Please come back for a follow-up appointment in 3 months.

## 2017-09-04 NOTE — Patient Instructions (Addendum)
check your blood sugar once a day.  vary the time of day when you check, between before the 3 meals, and at bedtime.  also check if you have symptoms of your blood sugar being too high or too low.  please keep a record of the readings and bring it to your next appointment here.  please call us sooner if you are having low blood sugar episodes, or if it is running above 200.  On this type of insulin schedule, you should eat meals on a regular schedule.  If a meal is missed or significantly delayed, your blood sugar could go low.   Please increase the insulin to 280 units each morning.   Please continue your weight loss efforts.   Please come back for a follow-up appointment in 3 months.

## 2017-12-09 ENCOUNTER — Ambulatory Visit: Payer: Medicare Other | Admitting: Endocrinology

## 2017-12-31 ENCOUNTER — Ambulatory Visit
Admission: RE | Admit: 2017-12-31 | Discharge: 2017-12-31 | Disposition: A | Discharge status: Home / Self Care | Payer: Managed Care, Other (non HMO) | Source: Ambulatory Visit | Attending: Cardiology | Admitting: Cardiology

## 2017-12-31 ENCOUNTER — Other Ambulatory Visit: Payer: Self-pay | Admitting: Cardiology

## 2017-12-31 DIAGNOSIS — I712 Thoracic aortic aneurysm, without rupture, unspecified: Secondary | ICD-10-CM

## 2018-01-02 ENCOUNTER — Encounter: Payer: Self-pay | Admitting: Endocrinology

## 2018-01-02 ENCOUNTER — Ambulatory Visit (INDEPENDENT_AMBULATORY_CARE_PROVIDER_SITE_OTHER): Payer: Managed Care, Other (non HMO) | Admitting: Endocrinology

## 2018-01-02 VITALS — BP 180/88 | HR 81 | Wt 259.8 lb

## 2018-01-02 DIAGNOSIS — E0821 Diabetes mellitus due to underlying condition with diabetic nephropathy: Secondary | ICD-10-CM

## 2018-01-02 DIAGNOSIS — E1159 Type 2 diabetes mellitus with other circulatory complications: Secondary | ICD-10-CM | POA: Diagnosis not present

## 2018-01-02 DIAGNOSIS — E669 Obesity, unspecified: Secondary | ICD-10-CM

## 2018-01-02 DIAGNOSIS — I10 Essential (primary) hypertension: Secondary | ICD-10-CM | POA: Diagnosis not present

## 2018-01-02 DIAGNOSIS — Z794 Long term (current) use of insulin: Secondary | ICD-10-CM

## 2018-01-02 LAB — POCT GLYCOSYLATED HEMOGLOBIN (HGB A1C): Hemoglobin A1C: 9.7

## 2018-01-02 MED ORDER — BASAGLAR KWIKPEN 100 UNIT/ML ~~LOC~~ SOPN: 300.0000 [IU] | PEN_INJECTOR | Freq: Every day | SUBCUTANEOUS | 3 refills | Status: DC

## 2018-01-02 MED ORDER — DULAGLUTIDE 0.75 MG/0.5ML ~~LOC~~ SOAJ: 0.7500 mg | SUBCUTANEOUS | 11 refills | Status: DC

## 2018-01-02 NOTE — Patient Instructions (Addendum)
Your blood pressure is high today.  Please see your primary care provider soon, to have it rechecked check your blood sugar once a day.  vary the time of day when you check, between before the 3 meals, and at bedtime.  also check if you have symptoms of your blood sugar being too high or too low.  please keep a record of the readings and bring it to your next appointment here.  please call us sooner if you are having low blood sugar episodes, or if it is running above 200.  On this type of insulin schedule, you should eat meals on a regular schedule.  If a meal is missed or significantly delayed, your blood sugar could go low.   Please continue the same insulin, and: I have sent a prescription to your pharmacy, to add "trulicity."  Please come back for a follow-up appointment in 2 months.        Bariatric Surgery You have so much to gain by losing weight.  You may have already tried every diet and exercise plan imaginable.  And, you may have sought advice from your family physician, too.   Sometimes, in spite of such diligent efforts, you may not be able to achieve long-term results by yourself.  In cases of severe obesity, bariatric or weight loss surgery is a proven method of achieving long-term weight control.  Our Services Our bariatric surgery programs offer our patients new hope and long-term weight-loss solution.  Since introducing our services in 2003, we have conducted more than 2,400 successful procedures.  Our program is designated as a Programmer, multimedia by the Metabolic and Bariatric Surgery Accreditation and Quality Improvement Program (MBSAQIP), a IT trainer that sets rigorous patient safety and outcome standards.  Our program is also designated as a Ecologist by [employer].   Our exceptional weight-loss surgery team specializes in diagnosis, treatment, follow-up care, and ongoing support for our patients with severe weight loss challenges.   We currently offer laparoscopic sleeve gastrectomy, gastric bypass, and adjustable gastric band (LAP-BAND).    Attend our [place] Choosing to undergo a bariatric procedure is a big decision, and one that should not be taken lightly.  You now have two options in how you learn about weight-loss surgery - in person or online.  Our objective is to ensure you have all of the information that you need to evaluate the advantages and obligations of this life changing procedure.  Please note that you are not alone in this process, and our experienced team is ready to assist and answer all of your questions.  There are several ways to register for a seminar (either on-line or in person): 1)  Call [phone] 2) Go on-line to [facility] and register for either type of seminar.  [email or web address]

## 2018-01-02 NOTE — Progress Notes (Signed)
Subjective:    Patient ID: Matthew Schmitt, male    DOB: October 10, 1951, 67 y.o.   MRN: [id number]  HPI Pt returns for f/u of diabetes mellitus: DM type: Insulin-requiring type 2. Dx'ed: 2002.   Complications: nephropathy, leg ulcers, and CAD.  Therapy: insulin since 2015.   DKA: never.   Severe hypoglycemia: once, in 2017.  Pancreatitis: never.   Other: he declines multiple daily injections; he has severe insulin resistance.  In early 2017, he requested to change levemir to lantus, due to high dosage requirement.   Interval history: He checks fasting only.  cbg varies from 198-205.  pt states he feels well in general.  He says he never misses the insulin.  He has increased to 300 units qam.  He says he does not want to increase the current insulin.   Past Medical History:  Diagnosis Date  . Anxiety   . Arthritis    everywhere  . Bladder calculus   . Coronary artery disease    cardiologist-  dr Wynonia Lawman  . Essential hypertension, benign   . First degree heart block   . Heart murmur   . History of kidney stones   . History of melanoma excision    June 2016  --- s/p  excision bilateral leg   . Impotence of organic origin   . Lower urinary tract symptoms (LUTS)   . Melanoma of lower leg, right ([place]) 2014  . Mixed hyperlipidemia    slightly  . Multiple renal cysts    bilateral  . PAF (paroxysmal atrial fibrillation) ([place])   . Presence of surgical incision    x2  bilateral leg and right thigh  . RBBB   . Renal cell carcinoma (HCC)    s/p  left partial nephrectomy  . Sleep apnea    no CPAP  . Type 2 diabetes mellitus ([place])    type2  . Wears glasses     Past Surgical History:  Procedure Laterality Date  . CARDIAC CATHETERIZATION  08-17-2011  DR SPENCER TILLEY   POSSIBLE MODERATE LAD STENOSIS JUST AFTER THE BIFURCATION OF LARGE DIAGONAL BRANCH/ LVEF  40-45%/ MILD GLOBAL HYPODINESIS (CATH DONE FOR ABNORMAL STRESS TEST OF FIXED LATERAL WALL DEFECT & BIFASCICULAR BLAOCK)  .  CARDIOVERSION N/A 04/02/2013   Procedure: CARDIOVERSION;  Surgeon: Jacolyn Reedy, MD;  Location: [place];  Service: Cardiovascular;  Laterality: N/A;  . CYSTOSCOPY WITH LITHOLAPAXY N/A 05/18/2015   Procedure: CYSTOSCOPY WITH LITHOLAPAXY;  Surgeon: Rana Snare, MD;  Location: [facility];  Service: Urology;  Laterality: N/A;  . CYSTOSCOPY WITH LITHOLAPAXY N/A 01/04/2017   Procedure: CYSTOSCOPY WITH LITHOLAPAXY, Holmium Laser;  Surgeon: Ardis Hughs, MD;  [facility];  Service: Urology;  Laterality: N/A;  . CYSTOSCOPY WITH RETROGRADE PYELOGRAM, URETEROSCOPY AND STENT PLACEMENT  11/03/2012   Procedure: CYSTOSCOPY WITH RETROGRADE PYELOGRAM, URETEROSCOPY AND STENT PLACEMENT;  Surgeon: Bernestine Amass, MD;  Location: [facility];  Service: Urology;  Laterality: Left;  Cystoscopy/Left Retrograde/Ureteroscopy/Holmium Laser Litho/Double J Stent  . HOLMIUM LASER APPLICATION  77/82/4235   Procedure: HOLMIUM LASER APPLICATION;  Surgeon: Bernestine Amass, MD;  Location: [facility];  Service: Urology;  Laterality: Left;  . HOLMIUM LASER APPLICATION N/A 3/61/4431   Procedure: HOLMIUM LASER APPLICATION;  Surgeon: Rana Snare, MD;  Location: [facility];  Service: Urology;  Laterality: N/A;  . JOINT REPLACEMENT    . KNEE SURGERY Bilateral 1978  &  1974  . MELANOMA  EXCISION Bilateral June 2016   20th-- left leg //  1st-- right leg w/ skin graft from right thigh (no lymph node bx)  . NEPHROLITHOTOMY Right 05/17/2014   Procedure: NEPHROLITHOTOMY PERCUTANEOUS;  Surgeon: Bernestine Amass, MD;  [facility];  Service: Urology;  Laterality: Right;  . PILONIDAL CYST EXCISION    . ROBOTIC ASSITED PARTIAL NEPHRECTOMY Left 06/18/2013   Procedure: ROBOTIC ASSISTED PARTIAL NEPHRECTOMY;  Surgeon: Dutch Gray, MD;  [facility];  Service: Urology;  Laterality: Left;  . ROTATOR CUFF REPAIR Left 07/2016  . TONSILLECTOMY  57  (age 45)  . TOTAL KNEE  ARTHROPLASTY Left 02/07/2017   Procedure: LEFT TOTAL KNEE ARTHROPLASTY;  Surgeon: Leandrew Koyanagi, MD;  Location: [place];  Service: Orthopedics;  Laterality: Left;  . TOTAL KNEE ARTHROPLASTY Right 04/25/2017  . TOTAL KNEE ARTHROPLASTY Right 04/25/2017   Procedure: RIGHT TOTAL KNEE ARTHROPLASTY;  Surgeon: Leandrew Koyanagi, MD;  Location: [place];  Service: Orthopedics;  Laterality: Right;  . TRANSURETHRAL RESECTION OF PROSTATE N/A 01/04/2017   Procedure: TRANSURETHRAL RESECTION OF THE PROSTATE (TURP);  Surgeon: Ardis Hughs, MD;  [facility];  Service: Urology;  Laterality: N/A;    Social History   Socioeconomic History  . Marital status: Married    Spouse name: Not on file  . Number of children: 2  . Years of education: 24  . Highest education level: Not on file  Social Needs  . Financial resource strain: Not on file  . Food insecurity - worry: Not on file  . Food insecurity - inability: Not on file  . Transportation needs - medical: Not on file  . Transportation needs - non-medical: Not on file  Occupational History  . Occupation: Government social research officer, Psychologist, occupational  Tobacco Use  . Smoking status: Former Smoker    Packs/day: 1.00    Years: 6.00    Pack years: 6.00    Types: Cigarettes    Last attempt to quit: 10/31/1969    Years since quitting: 48.2  . Smokeless tobacco: Never Used  Substance and Sexual Activity  . Alcohol use: Yes    Alcohol/week: 0.0 oz    Comment: OCCASIONAL  . Drug use: No  . Sexual activity: No  Other Topics Concern  . Not on file  Social History Narrative   Regular exercise-no    Current Outpatient Medications on File Prior to Visit  Medication Sig Dispense Refill  . acetaminophen (TYLENOL) 500 MG tablet Take 1,000 mg by mouth 3 (three) times daily as needed for moderate pain or headache.     Marland Kitchen amLODipine (NORVASC) 5 MG tablet Take 5 mg by mouth every evening.     Marland Kitchen aspirin EC 81 MG tablet Take 81 mg by mouth daily.    Marland Kitchen glucose blood (ONE TOUCH  ULTRA TEST) test strip USE 1 EACH 2 TIMES DAILY                              . 180 each 3  . Menthol, Topical Analgesic, (BENGAY EX) Apply 1 application topically daily as needed (pain).    . metoprolol (LOPRESSOR) 50 MG tablet Take 50 mg by mouth 2 (two) times daily.     . potassium citrate (UROCIT-K) 10 MEQ (1080 MG) SR tablet Take 2 tablets (20 mEq total) by mouth 2 (two) times daily. (Patient taking differently: Take 20 mEq by mouth every evening. ) 120 tablet 60  .  pravastatin (PRAVACHOL) 40 MG tablet Take 40 mg by mouth every evening.     Marland Kitchen spironolactone (ALDACTONE) 25 MG tablet Take 25 mg by mouth daily.    . valsartan-hydrochlorothiazide (DIOVAN-HCT) 320-25 MG per tablet Take 1 tablet by mouth every evening.     . docusate sodium (COLACE) 100 MG capsule Take 1 capsule (100 mg total) by mouth 2 (two) times daily. (Patient not taking: Reported on 01/02/2018) 60 capsule 0  . DOXAZOSIN MESYLATE PO Take 8 mg by mouth daily.     No current facility-administered medications on file prior to visit.     Allergies  Allergen Reactions  . Actos [Pioglitazone] Swelling    SWELLING REACTION UNSPECIFIED  EDEMA    Family History  Problem Relation Age of Onset  . Arthritis Father   . Hypertension Father   . Diabetes Father   . Arthritis Mother   . Hypertension Mother   . Heart attack Brother 65  . Diabetes Brother   . Lymphoma Paternal Grandfather   . Cancer Maternal Uncle   . Cancer Paternal Uncle        type unknown    BP (!) 180/88 (BP Location: Left Arm, Patient Position: Sitting, Cuff Size: Normal)   Pulse 81   Wt 259 lb 12.8 oz (117.8 kg)   SpO2 97%   BMI 36.23 kg/m    Review of Systems He denies hypoglycemia.  He has gained weight.     Objective:   Physical Exam VITAL SIGNS:  See vs page.  GENERAL: no distress.  Pulses: dorsalis pedis intact bilaterally.  Feet: no deformity. feet are of normal color and temp. There is severe hyperpigmentation of the ant tibial areas.  There is severe bilateral onychomycosis of the toenails.  CV: trace bilat leg edema.  Skin: no ulceron the feet.  Neuro: sensation is intact to touch on the feet.  Lab Results  Component Value Date   HGBA1C 9.7 01/02/2018      Assessment & Plan:  Insulin-requiring type 2 DM, with CAD, worse: we discussed. he hesitates to add another agent, due to cost.   After discussion, he agrees to add trulitity.  He declines insulin analogs, due to cost.  HTN: is noted today. Obesity: persistent   Patient Instructions  Your blood pressure is high today.  Please see your primary care provider soon, to have it rechecked check your blood sugar once a day.  vary the time of day when you check, between before the 3 meals, and at bedtime.  also check if you have symptoms of your blood sugar being too high or too low.  please keep a record of the readings and bring it to your next appointment here.  please call us sooner if you are having low blood sugar episodes, or if it is running above 200.  On this type of insulin schedule, you should eat meals on a regular schedule.  If a meal is missed or significantly delayed, your blood sugar could go low.   Please continue the same insulin, and: I have sent a prescription to your pharmacy, to add "trulicity."  Please come back for a follow-up appointment in 2 months.        Bariatric Surgery You have so much to gain by losing weight.  You may have already tried every diet and exercise plan imaginable.  And, you may have sought advice from your family physician, too.   Sometimes, in spite of such diligent efforts, you may not  be able to achieve long-term results by yourself.  In cases of severe obesity, bariatric or weight loss surgery is a proven method of achieving long-term weight control.  Our Services Our bariatric surgery programs offer our patients new hope and long-term weight-loss solution.  Since introducing our services in 2003, we have conducted  more than 2,400 successful procedures.  Our program is designated as a Programmer, multimedia by the Metabolic and Bariatric Surgery Accreditation and Quality Improvement Program (MBSAQIP), a IT trainer that sets rigorous patient safety and outcome standards.  Our program is also designated as a Ecologist by [employer].   Our exceptional weight-loss surgery team specializes in diagnosis, treatment, follow-up care, and ongoing support for our patients with severe weight loss challenges.  We currently offer laparoscopic sleeve gastrectomy, gastric bypass, and adjustable gastric band (LAP-BAND).    Attend our [place] Choosing to undergo a bariatric procedure is a big decision, and one that should not be taken lightly.  You now have two options in how you learn about weight-loss surgery - in person or online.  Our objective is to ensure you have all of the information that you need to evaluate the advantages and obligations of this life changing procedure.  Please note that you are not alone in this process, and our experienced team is ready to assist and answer all of your questions.  There are several ways to register for a seminar (either on-line or in person): 1)  Call [phone] 2) Go on-line to [facility] and register for either type of seminar.  [email or web address]

## 2018-01-16 ENCOUNTER — Ambulatory Visit (INDEPENDENT_AMBULATORY_CARE_PROVIDER_SITE_OTHER): Payer: Managed Care, Other (non HMO) | Admitting: Orthopaedic Surgery

## 2018-01-18 DIAGNOSIS — E119 Type 2 diabetes mellitus without complications: Secondary | ICD-10-CM

## 2018-01-18 DIAGNOSIS — E1165 Type 2 diabetes mellitus with hyperglycemia: Secondary | ICD-10-CM | POA: Insufficient documentation

## 2018-01-18 HISTORY — DX: Type 2 diabetes mellitus without complications: E11.9

## 2018-01-21 ENCOUNTER — Encounter (INDEPENDENT_AMBULATORY_CARE_PROVIDER_SITE_OTHER): Payer: Self-pay | Admitting: Orthopaedic Surgery

## 2018-01-21 ENCOUNTER — Ambulatory Visit (INDEPENDENT_AMBULATORY_CARE_PROVIDER_SITE_OTHER): Payer: Managed Care, Other (non HMO)

## 2018-01-21 ENCOUNTER — Ambulatory Visit (INDEPENDENT_AMBULATORY_CARE_PROVIDER_SITE_OTHER): Payer: Managed Care, Other (non HMO) | Admitting: Orthopaedic Surgery

## 2018-01-21 DIAGNOSIS — Z96653 Presence of artificial knee joint, bilateral: Secondary | ICD-10-CM

## 2018-01-21 NOTE — Progress Notes (Signed)
Office Visit Note   Patient: Matthew Schmitt           Date of Birth: September 25, 1951           MRN: [id number] Visit Date: 01/21/2018              Requested by: Practice, [address], [place] [id number] PCP: Isaias Sakai, DO   Assessment & Plan: Visit Diagnoses:  1. Status post total bilateral knee replacement     Plan: 1 year TKA follow up plan  Patient is now one year out from bilateral total knee arthroplasty. Patient is doing very well and very pleased with the results. Radiographs reveal a total knee arthroplasty in good position, with no evidence of subsidence, loosening, or complicating features. It was reinforced that prophylactic antibiotics should be taken with any procedure including but not limited to dental work or colonoscopies. We plan to follow them at 1 year intervals at this time with radiographs at each visit, and as always we should be notified with any questions or concerns in the interim.  Follow-Up Instructions: Return in about 1 year (around 01/22/2019).   Orders:  Orders Placed This Encounter  Procedures  . XR KNEE 3 VIEW LEFT  . XR KNEE 3 VIEW RIGHT   No orders of the defined types were placed in this encounter.     Procedures: No procedures performed   Clinical Data: No additional findings.   Subjective: Chief Complaint  Patient presents with  . Right Knee - Follow-up    Patient comes in today for follow-up of bilateral knee replacements done within the last year.  He is overall doing very well.  He is happy.  He still has some occasional swelling and soreness that is worse with activity.  Overall he is happy with his progress.  He has resumed his normal activities for the most part.    Review of Systems  Constitutional: Negative.   All other systems reviewed and are negative.    Objective: Vital Signs: There were no vitals taken for this visit.  Physical Exam  Constitutional: He is  oriented to person, place, and time. He appears well-developed and well-nourished.  Pulmonary/Chest: Effort normal.  Abdominal: Soft.  Neurological: He is alert and oriented to person, place, and time.  Skin: Skin is warm.  Psychiatric: He has a normal mood and affect. His behavior is normal. Judgment and thought content normal.  Nursing note and vitals reviewed.   Ortho Exam Bilateral knee exam shows fully healed surgical scars.  A small joint effusion on the right knee.  No evidence of infection.  Patella tracking is normal.  Preserved joint range of motion. Specialty Comments:  No specialty comments available.  Imaging: Xr Knee 3 View Left  Result Date: 01/21/2018 Total knee replacement in good alignment without complication  Xr Knee 3 View Right  Result Date: 01/21/2018 Total knee replacement in good alignment without complication    PMFS History: Patient Active Problem List   Diagnosis Date Noted  . Diabetes ([place]) 01/18/2018  . Total knee replacement status 02/07/2017  . Primary osteoarthritis of left knee   . BPH with obstruction/lower urinary tract symptoms 01/04/2017  . Rotator cuff syndrome of left shoulder 04/17/2016  . Nephrolithiasis 05/17/2014  . Encounter for long-term (current) use of other medications 07/27/2013  . Long-term (current) use of anticoagulants   . CAD (coronary artery disease) 04/02/2013  . Hypertensive heart disease 04/02/2013  . 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 Atrial flutter ([place])   . Nonischemic cardiomyopathy ([place])   . Obesity (BMI 30-39.9)   . History of kidney stones   . Osteoarthritis   . Hyperlipidemia    Past Medical History:  Diagnosis Date  . Anxiety   . Arthritis    everywhere  . Bladder calculus   . Coronary artery disease    cardiologist-  dr Wynonia Lawman  . Essential hypertension, benign   . First degree heart block   . Heart murmur   . History of kidney stones   . History  of melanoma excision    June 2016  --- s/p  excision bilateral leg   . Impotence of organic origin   . Lower urinary tract symptoms (LUTS)   . Melanoma of lower leg, right ([place]) 2014  . Mixed hyperlipidemia    slightly  . Multiple renal cysts    bilateral  . PAF (paroxysmal atrial fibrillation) ([place])   . Presence of surgical incision    x2  bilateral leg and right thigh  . RBBB   . Renal cell carcinoma ([facility])    s/p  left partial nephrectomy  . Sleep apnea    no CPAP  . Type 2 diabetes mellitus ([place])    type2  . Wears glasses     Family History  Problem Relation Age of Onset  . Arthritis Father   . Hypertension Father   . Diabetes Father   . Arthritis Mother   . Hypertension Mother   . Heart attack Brother 52  . Diabetes Brother   . Lymphoma Paternal Grandfather   . Cancer Maternal Uncle   . Cancer Paternal Uncle        type unknown    Past Surgical History:  Procedure Laterality Date  . CARDIAC CATHETERIZATION  08-17-2011  DR SPENCER TILLEY   POSSIBLE MODERATE LAD STENOSIS JUST AFTER THE BIFURCATION OF LARGE DIAGONAL BRANCH/ LVEF  40-45%/ MILD GLOBAL HYPODINESIS (CATH DONE FOR ABNORMAL STRESS TEST OF FIXED LATERAL WALL DEFECT & BIFASCICULAR BLAOCK)  . CARDIOVERSION N/A 04/02/2013   Procedure: CARDIOVERSION;  Surgeon: Jacolyn Reedy, MD;  Location: [facility];  Service: Cardiovascular;  Laterality: N/A;  . CYSTOSCOPY WITH LITHOLAPAXY N/A 05/18/2015   Procedure: CYSTOSCOPY WITH LITHOLAPAXY;  Surgeon: Rana Snare, MD;  Location: [facility];  Service: Urology;  Laterality: N/A;  . CYSTOSCOPY WITH LITHOLAPAXY N/A 01/04/2017   Procedure: CYSTOSCOPY WITH LITHOLAPAXY, Holmium Laser;  Surgeon: Ardis Hughs, MD;  Location: [facility];  Service: Urology;  Laterality: N/A;  . CYSTOSCOPY WITH RETROGRADE PYELOGRAM, URETEROSCOPY AND STENT PLACEMENT  11/03/2012   Procedure: CYSTOSCOPY WITH RETROGRADE PYELOGRAM, URETEROSCOPY AND STENT PLACEMENT;  Surgeon: Bernestine Amass, MD;   Location: [facility];  Service: Urology;  Laterality: Left;  Cystoscopy/Left Retrograde/Ureteroscopy/Holmium Laser Litho/Double J Stent  . HOLMIUM LASER APPLICATION  05/12/7627   Procedure: HOLMIUM LASER APPLICATION;  Surgeon: Bernestine Amass, MD;  Location[facility];  Service: Urology;  Laterality: Left;  . HOLMIUM LASER APPLICATION N/A 02/01/1760   Procedure: HOLMIUM LASER APPLICATION;  Surgeon: Rana Snare, MD;  Location[facility];  Service: Urology;  Laterality: N/A;  . JOINT REPLACEMENT    . KNEE SURGERY Bilateral 1978  &  1974  . MELANOMA EXCISION Bilateral June 2016   20th-- left leg //  1st-- right leg w/ skin graft from right thigh (no lymph node bx)  .  NEPHROLITHOTOMY Right 05/17/2014   Procedure: NEPHROLITHOTOMY PERCUTANEOUS;  Surgeon: Bernestine Amass, MD;  Location: [facility];  Service: Urology;  Laterality: Right;  . PILONIDAL CYST EXCISION    . ROBOTIC ASSITED PARTIAL NEPHRECTOMY Left 06/18/2013   Procedure: ROBOTIC ASSISTED PARTIAL NEPHRECTOMY;  Surgeon: Dutch Gray, MD;  Location: [facility];  Service: Urology;  Laterality: Left;  . ROTATOR CUFF REPAIR Left 07/2016  . TONSILLECTOMY  74  (age 37)  . TOTAL KNEE ARTHROPLASTY Left 02/07/2017   Procedure: LEFT TOTAL KNEE ARTHROPLASTY;  Surgeon: Leandrew Koyanagi, MD;  Location: [place];  Service: Orthopedics;  Laterality: Left;  . TOTAL KNEE ARTHROPLASTY Right 04/25/2017  . TOTAL KNEE ARTHROPLASTY Right 04/25/2017   Procedure: RIGHT TOTAL KNEE ARTHROPLASTY;  Surgeon: Leandrew Koyanagi, MD;  Location: [place];  Service: Orthopedics;  Laterality: Right;  . TRANSURETHRAL RESECTION OF PROSTATE N/A 01/04/2017   Procedure: TRANSURETHRAL RESECTION OF THE PROSTATE (TURP);  Surgeon: Ardis Hughs, MD;  [facility];  Service: Urology;  Laterality: N/A;   Social History   Occupational History  . Occupation: Government social research officer, Psychologist, occupational  Tobacco Use  . Smoking status: Former Smoker    Packs/day:  1.00    Years: 6.00    Pack years: 6.00    Types: Cigarettes    Last attempt to quit: 10/31/1969    Years since quitting: 48.2  . Smokeless tobacco: Never Used  Substance and Sexual Activity  . Alcohol use: Yes    Alcohol/week: 0.0 oz    Comment: OCCASIONAL  . Drug use: No  . Sexual activity: No

## 2018-03-05 ENCOUNTER — Ambulatory Visit: Payer: Managed Care, Other (non HMO) | Admitting: Endocrinology

## 2018-03-05 ENCOUNTER — Encounter: Payer: Self-pay | Admitting: Endocrinology

## 2018-03-05 VITALS — BP 162/72 | HR 74 | Wt 256.2 lb

## 2018-03-05 DIAGNOSIS — Z794 Long term (current) use of insulin: Secondary | ICD-10-CM | POA: Diagnosis not present

## 2018-03-05 DIAGNOSIS — E1159 Type 2 diabetes mellitus with other circulatory complications: Secondary | ICD-10-CM | POA: Diagnosis not present

## 2018-03-05 LAB — POCT GLYCOSYLATED HEMOGLOBIN (HGB A1C): Hemoglobin A1C: 7.6

## 2018-03-05 MED ORDER — GLUCOSE BLOOD VI STRP: ORAL_STRIP | 3 refills | Status: DC

## 2018-03-05 MED ORDER — DULAGLUTIDE 1.5 MG/0.5ML ~~LOC~~ SOAJ: 1.5000 mg | SUBCUTANEOUS | 3 refills | Status: DC

## 2018-03-05 MED ORDER — BASAGLAR KWIKPEN 100 UNIT/ML ~~LOC~~ SOPN: 200.0000 [IU] | PEN_INJECTOR | Freq: Every day | SUBCUTANEOUS | 3 refills | Status: DC

## 2018-03-05 NOTE — Progress Notes (Signed)
Subjective:    Patient ID: Matthew Schmitt, male    DOB: 1951/06/09, 67 y.o.   MRN: [id number]  HPI Pt returns for f/u of diabetes mellitus: DM type: Insulin-requiring type 2. Dx'ed: 2002.   Complications: nephropathy, leg ulcers, and CAD.  Therapy: insulin since 2015.   DKA: never.   Severe hypoglycemia: once, in 2017.  Pancreatitis: never.   Other: he declines multiple daily injections; he has severe insulin resistance.  In early 2017, he requested to change levemir to lantus, due to high dosage requirement.   Interval history: cbg varies from 115-170.  pt states he feels well in general.  He says he never misses the insulin, but sometimes takes less than the prescribed amount.  He has increased to 300 units qam.   Past Medical History:  Diagnosis Date  . Anxiety   . Arthritis    everywhere  . Bladder calculus   . Coronary artery disease    cardiologist-  dr Wynonia Lawman  . Essential hypertension, benign   . First degree heart block   . Heart murmur   . History of kidney stones   . History of melanoma excision    June 2016  --- s/p  excision bilateral leg   . Impotence of organic origin   . Lower urinary tract symptoms (LUTS)   . Melanoma of lower leg, right ([place]) 2014  . Mixed hyperlipidemia    slightly  . Multiple renal cysts    bilateral  . PAF (paroxysmal atrial fibrillation) ([place])   . Presence of surgical incision    x2  bilateral leg and right thigh  . RBBB   . Renal cell carcinoma ([facility])    s/p  left partial nephrectomy  . Sleep apnea    no CPAP  . Type 2 diabetes mellitus ([place])    type2  . Wears glasses     Past Surgical History:  Procedure Laterality Date  . CARDIAC CATHETERIZATION  08-17-2011  DR SPENCER TILLEY   POSSIBLE MODERATE LAD STENOSIS JUST AFTER THE BIFURCATION OF LARGE DIAGONAL BRANCH/ LVEF  40-45%/ MILD GLOBAL HYPODINESIS (CATH DONE FOR ABNORMAL STRESS TEST OF FIXED LATERAL WALL DEFECT & BIFASCICULAR BLAOCK)  . CARDIOVERSION N/A 04/02/2013   Procedure: CARDIOVERSION;  Surgeon: Jacolyn Reedy, MD;  Location: [place];  Service: Cardiovascular;  Laterality: N/A;  . CYSTOSCOPY WITH LITHOLAPAXY N/A 05/18/2015   Procedure: CYSTOSCOPY WITH LITHOLAPAXY;  Surgeon: Rana Snare, MD;  Location[facility];  Service: Urology;  Laterality: N/A;  . CYSTOSCOPY WITH LITHOLAPAXY N/A 01/04/2017   Procedure: CYSTOSCOPY WITH LITHOLAPAXY, Holmium Laser;  Surgeon: Ardis Hughs, MD;  [facility];  Service: Urology;  Laterality: N/A;  . CYSTOSCOPY WITH RETROGRADE PYELOGRAM, URETEROSCOPY AND STENT PLACEMENT  11/03/2012   Procedure: CYSTOSCOPY WITH RETROGRADE PYELOGRAM, URETEROSCOPY AND STENT PLACEMENT;  Surgeon: Bernestine Amass, MD;  Location: [facility];  Service: Urology;  Laterality: Left;  Cystoscopy/Left Retrograde/Ureteroscopy/Holmium Laser Litho/Double J Stent  . HOLMIUM LASER APPLICATION  78/24/2353   Procedure: HOLMIUM LASER APPLICATION;  Surgeon: Bernestine Amass, MD;  Location: [facility];  Service: Urology;  Laterality: Left;  . HOLMIUM LASER APPLICATION N/A 05/02/4314   Procedure: HOLMIUM LASER APPLICATION;  Surgeon: Rana Snare, MD;  Location: [facility];  Service: Urology;  Laterality: N/A;  . JOINT REPLACEMENT    . KNEE SURGERY Bilateral 1978  &  1974  . MELANOMA EXCISION Bilateral June 2016   20th-- left leg //  1st-- right leg w/ skin graft from right thigh (no lymph node bx)  . NEPHROLITHOTOMY Right 05/17/2014   Procedure: NEPHROLITHOTOMY PERCUTANEOUS;  Surgeon: Bernestine Amass, MD;  [facility];  Service: Urology;  Laterality: Right;  . PILONIDAL CYST EXCISION    . ROBOTIC ASSITED PARTIAL NEPHRECTOMY Left 06/18/2013   Procedure: ROBOTIC ASSISTED PARTIAL NEPHRECTOMY;  Surgeon: Dutch Gray, MD;  [facility];  Service: Urology;  Laterality: Left;  . ROTATOR CUFF REPAIR Left 07/2016  . TONSILLECTOMY  4  (age 42)  . TOTAL KNEE ARTHROPLASTY Left 02/07/2017    Procedure: LEFT TOTAL KNEE ARTHROPLASTY;  Surgeon: Leandrew Koyanagi, MD;  Location: [place];  Service: Orthopedics;  Laterality: Left;  . TOTAL KNEE ARTHROPLASTY Right 04/25/2017  . TOTAL KNEE ARTHROPLASTY Right 04/25/2017   Procedure: RIGHT TOTAL KNEE ARTHROPLASTY;  Surgeon: Leandrew Koyanagi, MD;  Location: [place];  Service: Orthopedics;  Laterality: Right;  . TRANSURETHRAL RESECTION OF PROSTATE N/A 01/04/2017   Procedure: TRANSURETHRAL RESECTION OF THE PROSTATE (TURP);  Surgeon: Ardis Hughs, MD;  [facility];  Service: Urology;  Laterality: N/A;    Social History   Socioeconomic History  . Marital status: Married    Spouse name: Not on file  . Number of children: 2  . Years of education: 67  . Highest education level: Not on file  Occupational History  . Occupation: Government social research officer, Psychologist, occupational  Social Needs  . Financial resource strain: Not on file  . Food insecurity:    Worry: Not on file    Inability: Not on file  . Transportation needs:    Medical: Not on file    Non-medical: Not on file  Tobacco Use  . Smoking status: Former Smoker    Packs/day: 1.00    Years: 6.00    Pack years: 6.00    Types: Cigarettes    Last attempt to quit: 10/31/1969    Years since quitting: 48.3  . Smokeless tobacco: Never Used  Substance and Sexual Activity  . Alcohol use: Yes    Alcohol/week: 0.0 oz    Comment: OCCASIONAL  . Drug use: No  . Sexual activity: Never  Lifestyle  . Physical activity:    Days per week: Not on file    Minutes per session: Not on file  . Stress: Not on file  Relationships  . Social connections:    Talks on phone: Not on file    Gets together: Not on file    Attends religious service: Not on file    Active member of club or organization: Not on file    Attends meetings of clubs or organizations: Not on file    Relationship status: Not on file  . Intimate partner violence:    Fear of current or ex partner: Not on file    Emotionally abused: Not on  file    Physically abused: Not on file    Forced sexual activity: Not on file  Other Topics Concern  . Not on file  Social History Narrative   Regular exercise-no    Current Outpatient Medications on File Prior to Visit  Medication Sig Dispense Refill  . amLODipine (NORVASC) 5 MG tablet Take 5 mg by mouth every evening.     Marland Kitchen aspirin EC 81 MG tablet Take 81 mg by mouth daily.    Marland Kitchen DOXAZOSIN MESYLATE PO Take 8 mg by mouth daily.    . Menthol, Topical Analgesic, (BENGAY EX) Apply 1 application topically daily  as needed (pain).    . metoprolol (LOPRESSOR) 50 MG tablet Take 50 mg by mouth 2 (two) times daily.     . pravastatin (PRAVACHOL) 40 MG tablet Take 40 mg by mouth every evening.     Marland Kitchen spironolactone (ALDACTONE) 25 MG tablet Take 25 mg by mouth daily.    . valsartan-hydrochlorothiazide (DIOVAN-HCT) 320-25 MG per tablet Take 1 tablet by mouth every evening.     Marland Kitchen acetaminophen (TYLENOL) 500 MG tablet Take 1,000 mg by mouth 3 (three) times daily as needed for moderate pain or headache.     . docusate sodium (COLACE) 100 MG capsule Take 1 capsule (100 mg total) by mouth 2 (two) times daily. (Patient not taking: Reported on 03/05/2018) 60 capsule 0  . potassium citrate (UROCIT-K) 10 MEQ (1080 MG) SR tablet Take 2 tablets (20 mEq total) by mouth 2 (two) times daily. (Patient not taking: Reported on 03/05/2018) 120 tablet 60   No current facility-administered medications on file prior to visit.     Allergies  Allergen Reactions  . Actos [Pioglitazone] Swelling    SWELLING REACTION UNSPECIFIED  EDEMA    Family History  Problem Relation Age of Onset  . Arthritis Father   . Hypertension Father   . Diabetes Father   . Arthritis Mother   . Hypertension Mother   . Heart attack Brother 49  . Diabetes Brother   . Lymphoma Paternal Grandfather   . Cancer Maternal Uncle   . Cancer Paternal Uncle        type unknown    BP (!) 162/72 (BP Location: Left Arm, Patient Position: Sitting,  Cuff Size: Normal)   Pulse 74   Wt 256 lb 3.2 oz (116.2 kg)   SpO2 97%   BMI 35.73 kg/m    Review of Systems He denies hypoglycemia.     Objective:   Physical Exam VITAL SIGNS:  See vs page.  GENERAL: no distress.  Pulses: dorsalis pedis intact bilaterally.  Feet: no deformity. feet are of normal color and temp. There is severe hyperpigmentation of the ant tibial areas. There is severe bilateral onychomycosis of the toenails.  CV: 1+ bilat leg edema.  Skin: no open ulceron the feet, but there are several healed ulcers.   Neuro: sensation is intact to touch on the feet.  A1c=7.6%     Assessment & Plan:  Insulin-requiring type 2 DM: this is the best control this pt should aim for, given this regimen, which does match insulin to his changing needs throughout the day.     Patient Instructions  Your blood pressure is high today.  Please see your primary care provider soon, to have it rechecked check your blood sugar once a day.  vary the time of day when you check, between before the 3 meals, and at bedtime.  also check if you have symptoms of your blood sugar being too high or too low.  please keep a record of the readings and bring it to your next appointment here.  please call us sooner if you are having low blood sugar episodes, or if it is running above 200.  On this type of insulin schedule, you should eat meals on a regular schedule.  If a meal is missed or significantly delayed, your blood sugar could go low.   I have sent a prescription to your pharmacy, to double the trulicity, and: Decrease the insulin to 200 units each morning.  Please come back for a follow-up appointment in 3  months.

## 2018-03-05 NOTE — Patient Instructions (Addendum)
Your blood pressure is high today.  Please see your primary care provider soon, to have it rechecked check your blood sugar once a day.  vary the time of day when you check, between before the 3 meals, and at bedtime.  also check if you have symptoms of your blood sugar being too high or too low.  please keep a record of the readings and bring it to your next appointment here.  please call us sooner if you are having low blood sugar episodes, or if it is running above 200.  On this type of insulin schedule, you should eat meals on a regular schedule.  If a meal is missed or significantly delayed, your blood sugar could go low.   I have sent a prescription to your pharmacy, to double the trulicity, and: Decrease the insulin to 200 units each morning.  Please come back for a follow-up appointment in 3 months.

## 2018-06-13 ENCOUNTER — Ambulatory Visit: Payer: Managed Care, Other (non HMO) | Admitting: Endocrinology

## 2018-07-09 ENCOUNTER — Ambulatory Visit: Payer: Managed Care, Other (non HMO) | Admitting: Endocrinology

## 2018-07-09 ENCOUNTER — Encounter: Payer: Self-pay | Admitting: Endocrinology

## 2018-07-09 VITALS — BP 142/80 | HR 76 | Ht 71.0 in | Wt 245.0 lb

## 2018-07-09 DIAGNOSIS — Z794 Long term (current) use of insulin: Secondary | ICD-10-CM | POA: Diagnosis not present

## 2018-07-09 DIAGNOSIS — E1159 Type 2 diabetes mellitus with other circulatory complications: Secondary | ICD-10-CM

## 2018-07-09 LAB — POCT GLYCOSYLATED HEMOGLOBIN (HGB A1C): Hemoglobin A1C: 7.1 % — AB (ref 4.0–5.6)

## 2018-07-09 MED ORDER — DAPAGLIFLOZIN PROPANEDIOL 5 MG PO TABS
5.0000 mg | ORAL_TABLET | Freq: Every day | ORAL | 3 refills | Status: DC
Start: 2018-07-09 — End: 2018-10-24

## 2018-07-09 MED ORDER — BASAGLAR KWIKPEN 100 UNIT/ML ~~LOC~~ SOPN: 180.0000 [IU] | PEN_INJECTOR | Freq: Every day | SUBCUTANEOUS | 3 refills | Status: DC

## 2018-07-09 NOTE — Progress Notes (Signed)
Subjective:    Patient ID: Matthew Schmitt, male    DOB: January 07, 1951, 67 y.o.   MRN: [id number]  HPI Pt returns for f/u of diabetes mellitus: DM type: Insulin-requiring type 2. Dx'ed: 2002.   Complications: nephropathy, leg ulcers, and CAD.  Therapy: insulin since 7782, and trulicity.   DKA: never.   Severe hypoglycemia: once, in 2017.  Pancreatitis: never.   Other: he declines multiple daily injections; he has severe insulin resistance.  In early 2017, he requested to change levemir to lantus, due to high dosage requirement.   Interval history: cbg varies from 88-200.  pt states he feels well in general.  He says he never misses the insulin. He seldom has hypoglycemia, and these episodes are mild.   Past Medical History:  Diagnosis Date  . Anxiety   . Arthritis    everywhere  . Bladder calculus   . Coronary artery disease    cardiologist-  dr Wynonia Lawman  . Essential hypertension, benign   . First degree heart block   . Heart murmur   . History of kidney stones   . History of melanoma excision    June 2016  --- s/p  excision bilateral leg   . Impotence of organic origin   . Lower urinary tract symptoms (LUTS)   . Melanoma of lower leg, right ([place]) 2014  . Mixed hyperlipidemia    slightly  . Multiple renal cysts    bilateral  . PAF (paroxysmal atrial fibrillation) ([place])   . Presence of surgical incision    x2  bilateral leg and right thigh  . RBBB   . Renal cell carcinoma (HCC)    s/p  left partial nephrectomy  . Sleep apnea    no CPAP  . Type 2 diabetes mellitus ([place])    type2  . Wears glasses     Past Surgical History:  Procedure Laterality Date  . CARDIAC CATHETERIZATION  08-17-2011  DR SPENCER TILLEY   POSSIBLE MODERATE LAD STENOSIS JUST AFTER THE BIFURCATION OF LARGE DIAGONAL BRANCH/ LVEF  40-45%/ MILD GLOBAL HYPODINESIS (CATH DONE FOR ABNORMAL STRESS TEST OF FIXED LATERAL WALL DEFECT & BIFASCICULAR BLAOCK)  . CARDIOVERSION N/A 04/02/2013   Procedure:  CARDIOVERSION;  Surgeon: Jacolyn Reedy, MD;  Location: [facility];  Service: Cardiovascular;  Laterality: N/A;  . CYSTOSCOPY WITH LITHOLAPAXY N/A 05/18/2015   Procedure: CYSTOSCOPY WITH LITHOLAPAXY;  Surgeon: Rana Snare, MD;  Location: [facility];  Service: Urology;  Laterality: N/A;  . CYSTOSCOPY WITH LITHOLAPAXY N/A 01/04/2017   Procedure: CYSTOSCOPY WITH LITHOLAPAXY, Holmium Laser;  Surgeon: Ardis Hughs, MD;  Location: [facility];  Service: Urology;  Laterality: N/A;  . CYSTOSCOPY WITH RETROGRADE PYELOGRAM, URETEROSCOPY AND STENT PLACEMENT  11/03/2012   Procedure: CYSTOSCOPY WITH RETROGRADE PYELOGRAM, URETEROSCOPY AND STENT PLACEMENT;  Surgeon: Bernestine Amass, MD;  Location: [facility];  Service: Urology;  Laterality: Left;  Cystoscopy/Left Retrograde/Ureteroscopy/Holmium Laser Litho/Double J Stent  . HOLMIUM LASER APPLICATION  42/35/3614   Procedure: HOLMIUM LASER APPLICATION;  Surgeon: Bernestine Amass, MD;  Location: [facility];  Service: Urology;  Laterality: Left;  . HOLMIUM LASER APPLICATION N/A 4/31/5400   Procedure: HOLMIUM LASER APPLICATION;  Surgeon: Rana Snare, MD;  Location: [facility];  Service: Urology;  Laterality: N/A;  . JOINT REPLACEMENT    . KNEE SURGERY Bilateral 1978  &  1974  . MELANOMA EXCISION Bilateral June 2016   20th-- left leg //  1st-- right leg  w/ skin graft from right thigh (no lymph node bx)  . NEPHROLITHOTOMY Right 05/17/2014   Procedure: NEPHROLITHOTOMY PERCUTANEOUS;  Surgeon: Bernestine Amass, MD;  Location: [facility];  Service: Urology;  Laterality: Right;  . PILONIDAL CYST EXCISION    . ROBOTIC ASSITED PARTIAL NEPHRECTOMY Left 06/18/2013   Procedure: ROBOTIC ASSISTED PARTIAL NEPHRECTOMY;  Surgeon: Dutch Gray, MD;  [facility];  Service: Urology;  Laterality: Left;  . ROTATOR CUFF REPAIR Left 07/2016  . TONSILLECTOMY  8  (age 62)  . TOTAL KNEE ARTHROPLASTY Left 02/07/2017   Procedure: LEFT  TOTAL KNEE ARTHROPLASTY;  Surgeon: Leandrew Koyanagi, MD;  Location: [facility];  Service: Orthopedics;  Laterality: Left;  . TOTAL KNEE ARTHROPLASTY Right 04/25/2017  . TOTAL KNEE ARTHROPLASTY Right 04/25/2017   Procedure: RIGHT TOTAL KNEE ARTHROPLASTY;  Surgeon: Leandrew Koyanagi, MD;  Location: [place];  Service: Orthopedics;  Laterality: Right;  . TRANSURETHRAL RESECTION OF PROSTATE N/A 01/04/2017   Procedure: TRANSURETHRAL RESECTION OF THE PROSTATE (TURP);  Surgeon: Ardis Hughs, MD;  Location[facility];  Service: Urology;  Laterality: N/A;    Social History   Socioeconomic History  . Marital status: Married    Spouse name: Not on file  . Number of children: 2  . Years of education: 70  . Highest education level: Not on file  Occupational History  . Occupation: Government social research officer, Psychologist, occupational  Social Needs  . Financial resource strain: Not on file  . Food insecurity:    Worry: Not on file    Inability: Not on file  . Transportation needs:    Medical: Not on file    Non-medical: Not on file  Tobacco Use  . Smoking status: Former Smoker    Packs/day: 1.00    Years: 6.00    Pack years: 6.00    Types: Cigarettes    Last attempt to quit: 10/31/1969    Years since quitting: 48.7  . Smokeless tobacco: Never Used  Substance and Sexual Activity  . Alcohol use: Yes    Alcohol/week: 0.0 standard drinks    Comment: OCCASIONAL  . Drug use: No  . Sexual activity: Never  Lifestyle  . Physical activity:    Days per week: Not on file    Minutes per session: Not on file  . Stress: Not on file  Relationships  . Social connections:    Talks on phone: Not on file    Gets together: Not on file    Attends religious service: Not on file    Active member of club or organization: Not on file    Attends meetings of clubs or organizations: Not on file    Relationship status: Not on file  . Intimate partner violence:    Fear of current or ex partner: Not on file    Emotionally abused: Not on file     Physically abused: Not on file    Forced sexual activity: Not on file  Other Topics Concern  . Not on file  Social History Narrative   Regular exercise-no    Current Outpatient Medications on File Prior to Visit  Medication Sig Dispense Refill  . acetaminophen (TYLENOL) 500 MG tablet Take 1,000 mg by mouth 3 (three) times daily as needed for moderate pain or headache.     Marland Kitchen aspirin EC 81 MG tablet Take 81 mg by mouth daily.    Marland Kitchen DOXAZOSIN MESYLATE PO Take 8 mg by mouth daily.    . Dulaglutide (TRULICITY) 1.5  MG/0.5ML SOPN Inject 1.5 mg into the skin once a week. 12 pen 3  . glucose blood (ONE TOUCH ULTRA TEST) test strip USE 1 EACH 2 TIMES DAILY                              . 180 each 3  . Menthol, Topical Analgesic, (BENGAY EX) Apply 1 application topically daily as needed (pain).    . metoprolol (LOPRESSOR) 50 MG tablet Take 50 mg by mouth 2 (two) times daily.     . potassium citrate (UROCIT-K) 10 MEQ (1080 MG) SR tablet Take 2 tablets (20 mEq total) by mouth 2 (two) times daily. 120 tablet 60  . pravastatin (PRAVACHOL) 40 MG tablet Take 40 mg by mouth every evening.     Marland Kitchen spironolactone (ALDACTONE) 25 MG tablet Take 25 mg by mouth daily.    . valsartan-hydrochlorothiazide (DIOVAN-HCT) 320-25 MG per tablet Take 1 tablet by mouth every evening.     Marland Kitchen amLODipine (NORVASC) 10 MG tablet     . docusate sodium (COLACE) 100 MG capsule Take 1 capsule (100 mg total) by mouth 2 (two) times daily. (Patient not taking: Reported on 07/09/2018) 60 capsule 0  . sildenafil (VIAGRA) 100 MG tablet Take 100 mg by mouth daily as needed.  2   No current facility-administered medications on file prior to visit.     Allergies  Allergen Reactions  . Actos [Pioglitazone] Swelling    SWELLING REACTION UNSPECIFIED  EDEMA    Family History  Problem Relation Age of Onset  . Arthritis Father   . Hypertension Father   . Diabetes Father   . Arthritis Mother   . Hypertension Mother   . Heart attack  Brother 71  . Diabetes Brother   . Lymphoma Paternal Grandfather   . Cancer Maternal Uncle   . Cancer Paternal Uncle        type unknown    BP (!) 142/80 (BP Location: Right Arm, Patient Position: Sitting, Cuff Size: Normal)   Pulse 76   Ht 5\' 11"  (1.803 m)   Wt 245 lb (111.1 kg)   SpO2 91%   BMI 34.17 kg/m    Review of Systems He denies LOC.      Objective:   Physical Exam VITAL SIGNS:  See vs page.  GENERAL: no distress.  Pulses: dorsalis pedis intact bilaterally.  Feet: no deformity. feet are of normal color and temp. There is severe hyperpigmentation of the ant tibial areas, and several abrasions there. There is severe bilateral onychomycosis of the toenails.  CV: trace bilat leg edema.  Skin: no open ulceron the feet, but there are several healed ulcers.   Neuro: sensation is intact to touch on the feet.  A1c=7.1%     Assessment & Plan:  HTN: is noted today. Insulin-requiring type 2 DM, with CAD: this is the best control this pt should aim for, given this regimen, which does match insulin to her changing needs throughout the day Hypoglycemia: we'll try to reduce insulin.  Patient Instructions  Your blood pressure is high today.  Please see your primary care provider soon, to have it rechecked check your blood sugar once a day.  vary the time of day when you check, between before the 3 meals, and at bedtime.  also check if you have symptoms of your blood sugar being too high or too low.  please keep a record of the readings and bring  it to your next appointment here.  please call us sooner if you are having low blood sugar episodes, or if it is running above 200.  On this type of insulin schedule, you should eat meals on a regular schedule.  If a meal is missed or significantly delayed, your blood sugar could go low.   I have sent a prescription to your pharmacy, to add "Farxiga," and: Decrease the insulin to 180 units each morning.  Please come back for a  follow-up appointment in 3 months.

## 2018-07-09 NOTE — Patient Instructions (Addendum)
Your blood pressure is high today.  Please see your primary care provider soon, to have it rechecked check your blood sugar once a day.  vary the time of day when you check, between before the 3 meals, and at bedtime.  also check if you have symptoms of your blood sugar being too high or too low.  please keep a record of the readings and bring it to your next appointment here.  please call us sooner if you are having low blood sugar episodes, or if it is running above 200.  On this type of insulin schedule, you should eat meals on a regular schedule.  If a meal is missed or significantly delayed, your blood sugar could go low.   I have sent a prescription to your pharmacy, to add "Farxiga," and: Decrease the insulin to 180 units each morning.  Please come back for a follow-up appointment in 3 months.

## 2018-09-16 DIAGNOSIS — I10 Essential (primary) hypertension: Secondary | ICD-10-CM | POA: Diagnosis not present

## 2018-09-16 DIAGNOSIS — Z23 Encounter for immunization: Secondary | ICD-10-CM | POA: Diagnosis not present

## 2018-09-16 DIAGNOSIS — E1165 Type 2 diabetes mellitus with hyperglycemia: Secondary | ICD-10-CM | POA: Diagnosis not present

## 2018-09-16 DIAGNOSIS — E669 Obesity, unspecified: Secondary | ICD-10-CM | POA: Diagnosis not present

## 2018-09-16 DIAGNOSIS — R740 Nonspecific elevation of levels of transaminase and lactic acid dehydrogenase [LDH]: Secondary | ICD-10-CM | POA: Diagnosis not present

## 2018-10-09 ENCOUNTER — Ambulatory Visit: Payer: Managed Care, Other (non HMO) | Admitting: Endocrinology

## 2018-10-24 ENCOUNTER — Encounter: Payer: Self-pay | Admitting: Endocrinology

## 2018-10-24 ENCOUNTER — Ambulatory Visit: Payer: BLUE CROSS/BLUE SHIELD | Admitting: Endocrinology

## 2018-10-24 VITALS — BP 124/64 | HR 74 | Ht 71.0 in | Wt 243.8 lb

## 2018-10-24 DIAGNOSIS — N200 Calculus of kidney: Secondary | ICD-10-CM | POA: Diagnosis not present

## 2018-10-24 DIAGNOSIS — E1159 Type 2 diabetes mellitus with other circulatory complications: Secondary | ICD-10-CM

## 2018-10-24 DIAGNOSIS — Z794 Long term (current) use of insulin: Secondary | ICD-10-CM

## 2018-10-24 LAB — POCT GLYCOSYLATED HEMOGLOBIN (HGB A1C): Hemoglobin A1C: 7.5 % — AB (ref 4.0–5.6)

## 2018-10-24 MED ORDER — BASAGLAR KWIKPEN 100 UNIT/ML ~~LOC~~ SOPN: 180.0000 [IU] | PEN_INJECTOR | Freq: Every day | SUBCUTANEOUS | 3 refills | Status: DC

## 2018-10-24 MED ORDER — DAPAGLIFLOZIN PROPANEDIOL 10 MG PO TABS: 10.0000 mg | ORAL_TABLET | Freq: Every day | ORAL | 3 refills | Status: DC

## 2018-10-24 MED ORDER — GLUCOSE BLOOD VI STRP: 1.0000 | ORAL_STRIP | Freq: Two times a day (BID) | 3 refills | Status: DC

## 2018-10-24 NOTE — Progress Notes (Signed)
Subjective:    Patient ID: Matthew Schmitt, male    DOB: [birth year], 67 y.o.   MRN: [id number]  HPI Pt returns for f/u of diabetes mellitus: DM type: Insulin-requiring type 2. Dx'ed: 2002.   Complications: nephropathy, leg ulcers, and CAD.  Therapy: insulin since 2015, Farxiga, and trulicity.   DKA: never.   Severe hypoglycemia: once, in 2017.  Pancreatitis: never.   Other: he declines multiple daily injections; he has severe insulin resistance.  In early 2017, he requested to change levemir to lantus, due to high dosage requirement.   Interval history: cbg varies from 120-165 fasting.  pt states he feels well in general.  He says he never misses the insulin.  Past Medical History:  Diagnosis Date  . Anxiety   . Arthritis    everywhere  . Bladder calculus   . Coronary artery disease    cardiologist-  dr Wynonia Lawman  . Essential hypertension, benign   . First degree heart block   . Heart murmur   . History of kidney stones   . History of melanoma excision    June 2016  --- s/p  excision bilateral leg   . Impotence of organic origin   . Lower urinary tract symptoms (LUTS)   . Melanoma of lower leg, right ([place]) 2014  . Mixed hyperlipidemia    slightly  . Multiple renal cysts    bilateral  . PAF (paroxysmal atrial fibrillation) ([place])   . Presence of surgical incision    x2  bilateral leg and right thigh  . RBBB   . Renal cell carcinoma (HCC)    s/p  left partial nephrectomy  . Sleep apnea    no CPAP  . Type 2 diabetes mellitus ([place])    type2  . Wears glasses     Past Surgical History:  Procedure Laterality Date  . CARDIAC CATHETERIZATION  08-17-2011  DR SPENCER TILLEY   POSSIBLE MODERATE LAD STENOSIS JUST AFTER THE BIFURCATION OF LARGE DIAGONAL BRANCH/ LVEF  40-45%/ MILD GLOBAL HYPODINESIS (CATH DONE FOR ABNORMAL STRESS TEST OF FIXED LATERAL WALL DEFECT & BIFASCICULAR BLAOCK)  . CARDIOVERSION N/A 04/02/2013   Procedure: CARDIOVERSION;  Surgeon: Jacolyn Reedy, MD;   Location: [facility];  Service: Cardiovascular;  Laterality: N/A;  . CYSTOSCOPY WITH LITHOLAPAXY N/A 05/18/2015   Procedure: CYSTOSCOPY WITH LITHOLAPAXY;  Surgeon: Rana Snare, MD;  Location: [facility];  Service: Urology;  Laterality: N/A;  . CYSTOSCOPY WITH LITHOLAPAXY N/A 01/04/2017   Procedure: CYSTOSCOPY WITH LITHOLAPAXY, Holmium Laser;  Surgeon: Ardis Hughs, MD;  [facility];  Service: Urology;  Laterality: N/A;  . CYSTOSCOPY WITH RETROGRADE PYELOGRAM, URETEROSCOPY AND STENT PLACEMENT  11/03/2012   Procedure: CYSTOSCOPY WITH RETROGRADE PYELOGRAM, URETEROSCOPY AND STENT PLACEMENT;  Surgeon: Bernestine Amass, MD;  Location: [facility];  Service: Urology;  Laterality: Left;  Cystoscopy/Left Retrograde/Ureteroscopy/Holmium Laser Litho/Double J Stent  . HOLMIUM LASER APPLICATION  56/21/3086   Procedure: HOLMIUM LASER APPLICATION;  Surgeon: Bernestine Amass, MD;  Location: [facility];  Service: Urology;  Laterality: Left;  . HOLMIUM LASER APPLICATION N/A 5/78/4696   Procedure: HOLMIUM LASER APPLICATION;  Surgeon: Rana Snare, MD;  Location[facility];  Service: Urology;  Laterality: N/A;  . JOINT REPLACEMENT    . KNEE SURGERY Bilateral 1978  &  1974  . MELANOMA EXCISION Bilateral June 2016   20th-- left leg //  1st-- right leg w/ skin graft from right thigh (no lymph  node bx)  . NEPHROLITHOTOMY Right 05/17/2014   Procedure: NEPHROLITHOTOMY PERCUTANEOUS;  Surgeon: Bernestine Amass, MD;  Location[facility];  Service: Urology;  Laterality: Right;  . PILONIDAL CYST EXCISION    . ROBOTIC ASSITED PARTIAL NEPHRECTOMY Left 06/18/2013   Procedure: ROBOTIC ASSISTED PARTIAL NEPHRECTOMY;  Surgeon: Dutch Gray, MD;  [facility];  Service: Urology;  Laterality: Left;  . ROTATOR CUFF REPAIR Left 07/2016  . TONSILLECTOMY  94  (age 59)  . TOTAL KNEE ARTHROPLASTY Left 02/07/2017   Procedure: LEFT TOTAL KNEE ARTHROPLASTY;  Surgeon: Leandrew Koyanagi,  MD;  Location: [facility];  Service: Orthopedics;  Laterality: Left;  . TOTAL KNEE ARTHROPLASTY Right 04/25/2017  . TOTAL KNEE ARTHROPLASTY Right 04/25/2017   Procedure: RIGHT TOTAL KNEE ARTHROPLASTY;  Surgeon: Leandrew Koyanagi, MD;  Location: [place];  Service: Orthopedics;  Laterality: Right;  . TRANSURETHRAL RESECTION OF PROSTATE N/A 01/04/2017   Procedure: TRANSURETHRAL RESECTION OF THE PROSTATE (TURP);  Surgeon: Ardis Hughs, MD;  [facility];  Service: Urology;  Laterality: N/A;    Social History   Socioeconomic History  . Marital status: Married    Spouse name: Not on file  . Number of children: 2  . Years of education: 47  . Highest education level: Not on file  Occupational History  . Occupation: Government social research officer, Psychologist, occupational  Social Needs  . Financial resource strain: Not on file  . Food insecurity:    Worry: Not on file    Inability: Not on file  . Transportation needs:    Medical: Not on file    Non-medical: Not on file  Tobacco Use  . Smoking status: Former Smoker    Packs/day: 1.00    Years: 6.00    Pack years: 6.00    Types: Cigarettes    Last attempt to quit: 10/31/1969    Years since quitting: 49.0  . Smokeless tobacco: Never Used  Substance and Sexual Activity  . Alcohol use: Yes    Alcohol/week: 0.0 standard drinks    Comment: OCCASIONAL  . Drug use: No  . Sexual activity: Never  Lifestyle  . Physical activity:    Days per week: Not on file    Minutes per session: Not on file  . Stress: Not on file  Relationships  . Social connections:    Talks on phone: Not on file    Gets together: Not on file    Attends religious service: Not on file    Active member of club or organization: Not on file    Attends meetings of clubs or organizations: Not on file    Relationship status: Not on file  . Intimate partner violence:    Fear of current or ex partner: Not on file    Emotionally abused: Not on file    Physically abused: Not on file    Forced  sexual activity: Not on file  Other Topics Concern  . Not on file  Social History Narrative   Regular exercise-no    Current Outpatient Medications on File Prior to Visit  Medication Sig Dispense Refill  . amLODipine (NORVASC) 10 MG tablet     . aspirin EC 81 MG tablet Take 81 mg by mouth daily.    . Dulaglutide (TRULICITY) 1.5 YK/5.9DJ SOPN Inject 1.5 mg into the skin once a week. 12 pen 3  . Menthol, Topical Analgesic, (BENGAY EX) Apply 1 application topically daily as needed (pain).    . metoprolol (LOPRESSOR) 50 MG  tablet Take 50 mg by mouth 2 (two) times daily.     . sildenafil (VIAGRA) 100 MG tablet Take 100 mg by mouth daily as needed.  2  . spironolactone (ALDACTONE) 25 MG tablet Take 25 mg by mouth daily.    . valsartan-hydrochlorothiazide (DIOVAN-HCT) 320-25 MG per tablet Take 1 tablet by mouth every evening.      No current facility-administered medications on file prior to visit.     Allergies  Allergen Reactions  . Actos [Pioglitazone] Swelling    SWELLING REACTION UNSPECIFIED  EDEMA    Family History  Problem Relation Age of Onset  . Arthritis Father   . Hypertension Father   . Diabetes Father   . Arthritis Mother   . Hypertension Mother   . Heart attack Brother 63  . Diabetes Brother   . Lymphoma Paternal Grandfather   . Cancer Maternal Uncle   . Cancer Paternal Uncle        type unknown    BP 124/64 (BP Location: Left Arm, Patient Position: Sitting, Cuff Size: Large)   Pulse 74   Ht 5\' 11"  (1.803 m)   Wt 243 lb 12.8 oz (110.6 kg)   SpO2 96%   BMI 34.00 kg/m    Review of Systems He has lost 2 lbs, since last ov.  He denies hypoglycemia.       Objective:   Physical Exam VITAL SIGNS:  See vs page.  GENERAL: no distress.  Pulses: dorsalis pedis intact bilaterally.  Feet: no deformity. feet are of normal color and temp. There is severe hyperpigmentation of the ant tibial areas, and several abrasions there.  CV: 1+ bilat leg edema.  Skin: no  open ulceron the feet, but there are several healed ulcers.   Neuro: sensation is intact to touch on the feet. Ext: There is bilateral onychomycosis of the toenails.    Lab Results  Component Value Date   HGBA1C 7.5 (A) 10/24/2018   Lab Results  Component Value Date   CREATININE 1.03 04/26/2017   BUN 21 (H) 04/26/2017   NA 138 04/26/2017   K 3.5 04/26/2017   CL 104 04/26/2017   CO2 29 04/26/2017      Assessment & Plan:  Insulin-requiring type 2 DM, with CAD: worse Edema: this limits rx options.  Patient Instructions  check your blood sugar once a day.  vary the time of day when you check, between before the 3 meals, and at bedtime.  also check if you have symptoms of your blood sugar being too high or too low.  please keep a record of the readings and bring it to your next appointment here.  please call us sooner if you are having low blood sugar episodes, or if it is running above 200.  On this type of insulin schedule, you should eat meals on a regular schedule.  If a meal is missed or significantly delayed, your blood sugar could go low.   I have sent a prescription to your pharmacy, to increase the [place]. Please continue the same other diabetes medications Please come back for a follow-up appointment in 3 months.

## 2018-10-24 NOTE — Patient Instructions (Addendum)
check your blood sugar once a day.  vary the time of day when you check, between before the 3 meals, and at bedtime.  also check if you have symptoms of your blood sugar being too high or too low.  please keep a record of the readings and bring it to your next appointment here.  please call us sooner if you are having low blood sugar episodes, or if it is running above 200.  On this type of insulin schedule, you should eat meals on a regular schedule.  If a meal is missed or significantly delayed, your blood sugar could go low.   I have sent a prescription to your pharmacy, to increase the [place]. Please continue the same other diabetes medications Please come back for a follow-up appointment in 3 months.

## 2018-11-14 ENCOUNTER — Other Ambulatory Visit: Payer: Self-pay | Admitting: Cardiology

## 2018-11-14 MED ORDER — AMLODIPINE BESYLATE 10 MG PO TABS: 10.0000 mg | ORAL_TABLET | Freq: Every day | ORAL | 0 refills | Status: DC

## 2018-11-14 NOTE — Telephone Encounter (Signed)
° ° °  1. Which medications need to be refilled? (please list name of each medication and dose if known) Amlodipine besylate tabl 10mg  1QD  2. Which pharmacy/location (including street and city if local pharmacy) is medication to be sent to? [facility] home delivery  3. Do they need a 30 day or 90 day supply? [place]

## 2018-11-14 NOTE — Telephone Encounter (Signed)
Refill for amlodipine sent to [facility] per patient's request with no refills. Patient has been scheduled to see Dr. Bettina Gavia in the [facility] office on Thursday, 11/27/2018 at 8:00 am. Patient provided with the address and phone number for our office. No further questions.

## 2018-11-26 NOTE — Progress Notes (Signed)
Cardiology Office Note:    Date:  11/27/2018   ID:  Matthew Schmitt, DOB [birth year], MRN [id number]  PCP:  Matthew Sakai, DO  Cardiologist:  Matthew More, MD    Referring MD: Matthew Schmitt*    ASSESSMENT:    1. Coronary artery disease of native artery of native heart with stable angina pectoris (Matthew Schmitt)   2. Atrial flutter, unspecified type (Matthew Schmitt)   3. Long term current use of anticoagulant therapy   4. Hypertensive heart disease without heart failure   5. Hyperlipidemia   6. RBBB (right bundle branch block with left anterior fascicular block)    PLAN:    In order of problems listed above:  1. Stable CAD asymptomatic New York Heart Association class I, continue medical therapy including aspirin beta-blocker at this time I would not advise an ischemia evaluation 2. Stable continue sinus rhythm continue beta-blocker aspirin and currently not anticoagulated 3. Stable blood pressure he is having some mild dependent edema we discussed changing medications and is not particularly bothered and will continue his current regimen including distal diuretic ARB thiazide spironolactone and calcium channel blocker 4. Presently not on a statin most recent lipid profile from 1 year ago shows an LDL of 74 he tells me his lipids are being managed by his primary care physician 5. Stable EKG pattern asymptomatic   Next appointment: 6 to 9 months   Medication Adjustments/Labs and Tests Ordered: Current medicines are reviewed at length with the patient today.  Concerns regarding medicines are outlined above.  No orders of the defined types were placed in this encounter.  No orders of the defined types were placed in this encounter.   Chief Complaint  Patient presents with  . Follow-up    for  . Coronary Artery Disease  . Atrial Fibrillation  . Hypertension  . Hyperlipidemia    History of Present Illness:    Matthew Schmitt is a 68 y.o. male with a hx of CAD, PAF,  Hypertension, T2 DM  and hyperlipidemia last seen by Matthew Schmitt 6 months ago. Compliance with diet, lifestyle and medications: Yes  Remains active has had no angina dyspnea palpitation or syncope his only complaint is mild dependent edema at the ankles at the end of the day related to calcium channel blocker.  He relates of labs lipids A1c renal function is followed with his primary care physician on now I am unable to access in the epic system Past Medical History:  Diagnosis Date  . Anxiety   . Arthritis    everywhere  . Bladder calculus   . Coronary artery disease    cardiologist-  dr Matthew Schmitt  . Essential hypertension, benign   . First degree heart block   . Heart murmur   . History of kidney stones   . History of melanoma excision    June 2016  --- s/p  excision bilateral leg   . Impotence of organic origin   . Lower urinary tract symptoms (LUTS)   . Melanoma of lower leg, right ([employer]) 2014  . Mixed hyperlipidemia    slightly  . Multiple renal cysts    bilateral  . PAF (paroxysmal atrial fibrillation) ([place])   . Presence of surgical incision    x2  bilateral leg and right thigh  . RBBB   . Renal cell carcinoma (HCC)    s/p  left partial nephrectomy  . Sleep apnea    no CPAP  . Type 2 diabetes mellitus ([place])  type2  . Wears glasses     Past Surgical History:  Procedure Laterality Date  . CARDIAC CATHETERIZATION  08-17-2011  DR Matthew Schmitt   POSSIBLE MODERATE LAD STENOSIS JUST AFTER THE BIFURCATION OF LARGE DIAGONAL BRANCH/ LVEF  40-45%/ MILD GLOBAL HYPODINESIS (CATH DONE FOR ABNORMAL STRESS TEST OF FIXED LATERAL WALL DEFECT & BIFASCICULAR BLAOCK)  . CARDIOVERSION N/A 04/02/2013   Procedure: CARDIOVERSION;  Surgeon: Matthew Reedy, MD;  Location: [facility];  Service: Cardiovascular;  Laterality: N/A;  . CYSTOSCOPY WITH LITHOLAPAXY N/A 05/18/2015   Procedure: CYSTOSCOPY WITH LITHOLAPAXY;  Surgeon: Matthew Snare, MD;  Location[facility];  Service: Urology;   Laterality: N/A;  . CYSTOSCOPY WITH LITHOLAPAXY N/A 01/04/2017   Procedure: CYSTOSCOPY WITH LITHOLAPAXY, Holmium Laser;  Surgeon: Matthew Hughs, MD;  [facility];  Service: Urology;  Laterality: N/A;  . CYSTOSCOPY WITH RETROGRADE PYELOGRAM, URETEROSCOPY AND STENT PLACEMENT  11/03/2012   Procedure: CYSTOSCOPY WITH RETROGRADE PYELOGRAM, URETEROSCOPY AND STENT PLACEMENT;  Surgeon: Matthew Amass, MD;  Location: [facility];  Service: Urology;  Laterality: Left;  Cystoscopy/Left Retrograde/Ureteroscopy/Holmium Laser Litho/Double J Stent  . HOLMIUM LASER APPLICATION  42/35/3614   Procedure: HOLMIUM LASER APPLICATION;  Surgeon: Matthew Amass, MD;  Location: [facility];  Service: Urology;  Laterality: Left;  . HOLMIUM LASER APPLICATION N/A 4/31/5400   Procedure: HOLMIUM LASER APPLICATION;  Surgeon: Matthew Snare, MD;  Location: [facility];  Service: Urology;  Laterality: N/A;  . JOINT REPLACEMENT    . KNEE SURGERY Bilateral 1978  &  1974  . MELANOMA EXCISION Bilateral June 2016   20th-- left leg //  1st-- right leg w/ skin graft from right thigh (no lymph node bx)  . NEPHROLITHOTOMY Right 05/17/2014   Procedure: NEPHROLITHOTOMY PERCUTANEOUS;  Surgeon: Matthew Amass, MD;  [facility];  Service: Urology;  Laterality: Right;  . PILONIDAL CYST EXCISION    . ROBOTIC ASSITED PARTIAL NEPHRECTOMY Left 06/18/2013   Procedure: ROBOTIC ASSISTED PARTIAL NEPHRECTOMY;  Surgeon: Matthew Gray, MD;  [facility];  Service: Urology;  Laterality: Left;  . ROTATOR CUFF REPAIR Left 07/2016  . TONSILLECTOMY  72  (age 45)  . TOTAL KNEE ARTHROPLASTY Left 02/07/2017   Procedure: LEFT TOTAL KNEE ARTHROPLASTY;  Surgeon: Matthew Koyanagi, MD;  Location: [place];  Service: Orthopedics;  Laterality: Left;  . TOTAL KNEE ARTHROPLASTY Right 04/25/2017  . TOTAL KNEE ARTHROPLASTY Right 04/25/2017   Procedure: RIGHT TOTAL KNEE ARTHROPLASTY;  Surgeon: Matthew Koyanagi, MD;  Location:  [place];  Service: Orthopedics;  Laterality: Right;  . TRANSURETHRAL RESECTION OF PROSTATE N/A 01/04/2017   Procedure: TRANSURETHRAL RESECTION OF THE PROSTATE (TURP);  Surgeon: Matthew Hughs, MD;  [facility];  Service: Urology;  Laterality: N/A;    Current Medications: No outpatient medications have been marked as taking for the 11/27/18 encounter (Office Visit) with Richardo Priest, MD.     Allergies:   Actos [pioglitazone]   Social History   Socioeconomic History  . Marital status: Married    Spouse name: Not on file  . Number of children: 2  . Years of education: 65  . Highest education level: Not on file  Occupational History  . Occupation: Government social research officer, Psychologist, occupational  Social Needs  . Financial resource strain: Not on file  . Food insecurity:    Worry: Not on file    Inability: Not on file  . Transportation needs:    Medical: Not  on file    Non-medical: Not on file  Tobacco Use  . Smoking status: Former Smoker    Packs/day: 1.00    Years: 6.00    Pack years: 6.00    Types: Cigarettes    Last attempt to quit: 10/31/1969    Years since quitting: 49.1  . Smokeless tobacco: Never Used  Substance and Sexual Activity  . Alcohol use: Yes    Alcohol/week: 0.0 standard drinks    Comment: OCCASIONAL  . Drug use: No  . Sexual activity: Never  Lifestyle  . Physical activity:    Days per week: Not on file    Minutes per session: Not on file  . Stress: Not on file  Relationships  . Social connections:    Talks on phone: Not on file    Gets together: Not on file    Attends religious service: Not on file    Active member of club or organization: Not on file    Attends meetings of clubs or organizations: Not on file    Relationship status: Not on file  Other Topics Concern  . Not on file  Social History Narrative   Regular exercise-no     Family History: The patient's family history includes Arthritis in his father and mother; Cancer in his maternal  uncle and paternal uncle; Diabetes in his brother and father; Heart attack (age of onset: 54) in his brother; Hypertension in his father and mother; Lymphoma in his paternal grandfather. ROS:   Please see the history of present illness.    All other systems reviewed and are negative.  EKGs/Labs/Other Studies Reviewed:    The following studies were reviewed today:  EKG:  EKG ordered today.  The ekg ordered today demonstrates sinus rhythm right bundle branch block left anterior hemiblock  Recent Labs: No results found for requested labs within last 8760 hours.  Recent Lipid Panel    Component Value Date/Time   CHOL 135 07/20/2014 0822   TRIG 130 07/20/2014 0822   HDL 32 (L) 07/20/2014 0822   CHOLHDL 4.2 07/20/2014 0822   VLDL 26 07/20/2014 0822   LDLCALC 77 07/20/2014 0822    Physical Exam:    VS:  Pulse 73   Ht 5\' 11"  (1.803 m)   Wt 244 lb 1.9 oz (110.7 kg)   SpO2 98%   BMI 34.05 kg/m     Wt Readings from Last 3 Encounters:  11/27/18 244 lb 1.9 oz (110.7 kg)  10/24/18 243 lb 12.8 oz (110.6 kg)  07/09/18 245 lb (111.1 kg)     GEN:  Well nourished, well developed in no acute distress HEENT: Normal NECK: No JVD; No carotid bruits LYMPHATICS: No lymphadenopathy CARDIAC: RRR, no murmurs, rubs, gallops RESPIRATORY:  Clear to auscultation without rales, wheezing or rhonchi  ABDOMEN: Soft, non-tender, non-distended MUSCULOSKELETAL:  1+ ankle bilateral edema; No deformity  SKIN: Warm and dry NEUROLOGIC:  Alert and oriented x 3 PSYCHIATRIC:  Normal affect    Signed, Matthew More, MD  11/27/2018 8:19 AM    [place]

## 2018-11-27 ENCOUNTER — Ambulatory Visit (INDEPENDENT_AMBULATORY_CARE_PROVIDER_SITE_OTHER): Payer: BLUE CROSS/BLUE SHIELD | Admitting: Cardiology

## 2018-11-27 ENCOUNTER — Encounter: Payer: Self-pay | Admitting: Cardiology

## 2018-11-27 VITALS — BP 144/80 | HR 73 | Ht 71.0 in | Wt 244.1 lb

## 2018-11-27 DIAGNOSIS — I452 Bifascicular block: Secondary | ICD-10-CM | POA: Diagnosis not present

## 2018-11-27 DIAGNOSIS — I4892 Unspecified atrial flutter: Secondary | ICD-10-CM

## 2018-11-27 DIAGNOSIS — E782 Mixed hyperlipidemia: Secondary | ICD-10-CM

## 2018-11-27 DIAGNOSIS — I119 Hypertensive heart disease without heart failure: Secondary | ICD-10-CM | POA: Diagnosis not present

## 2018-11-27 DIAGNOSIS — Z7901 Long term (current) use of anticoagulants: Secondary | ICD-10-CM

## 2018-11-27 DIAGNOSIS — I25118 Atherosclerotic heart disease of native coronary artery with other forms of angina pectoris: Secondary | ICD-10-CM | POA: Diagnosis not present

## 2018-11-27 NOTE — Patient Instructions (Signed)
Medication Instructions:  Your physician recommends that you continue on your current medications as directed. Please refer to the Current Medication list given to you today.  If you need a refill on your cardiac medications before your next appointment, please call your pharmacy.   Lab work: None  If you have labs (blood work) drawn today and your tests are completely normal, you will receive your results only by: Marland Kitchen MyChart Message (if you have MyChart) OR . A paper copy in the mail If you have any lab test that is abnormal or we need to change your treatment, we will call you to review the results.  Testing/Procedures: You had an EKG today.   Follow-Up: At [facility], you and your health needs are our priority.  As part of our continuing mission to provide you with exceptional heart care, we have created designated Provider Care Teams.  These Care Teams include your primary Cardiologist (physician) and Advanced Practice Providers (APPs -  Physician Assistants and Nurse Practitioners) who all work together to provide you with the care you need, when you need it. You will need a follow up appointment in 9 months.  Please call our office 2 months in advance to schedule this appointment.

## 2018-12-08 DIAGNOSIS — N200 Calculus of kidney: Secondary | ICD-10-CM | POA: Diagnosis not present

## 2018-12-23 DIAGNOSIS — N200 Calculus of kidney: Secondary | ICD-10-CM | POA: Diagnosis not present

## 2019-01-06 DIAGNOSIS — H52203 Unspecified astigmatism, bilateral: Secondary | ICD-10-CM | POA: Diagnosis not present

## 2019-01-06 DIAGNOSIS — H5213 Myopia, bilateral: Secondary | ICD-10-CM | POA: Diagnosis not present

## 2019-01-06 LAB — HM DIABETES EYE EXAM

## 2019-01-14 DIAGNOSIS — Z85820 Personal history of malignant melanoma of skin: Secondary | ICD-10-CM | POA: Diagnosis not present

## 2019-01-14 DIAGNOSIS — L570 Actinic keratosis: Secondary | ICD-10-CM | POA: Diagnosis not present

## 2019-01-14 DIAGNOSIS — L814 Other melanin hyperpigmentation: Secondary | ICD-10-CM | POA: Diagnosis not present

## 2019-01-22 ENCOUNTER — Ambulatory Visit (INDEPENDENT_AMBULATORY_CARE_PROVIDER_SITE_OTHER): Payer: Managed Care, Other (non HMO) | Admitting: Orthopaedic Surgery

## 2019-01-23 ENCOUNTER — Ambulatory Visit (INDEPENDENT_AMBULATORY_CARE_PROVIDER_SITE_OTHER): Payer: BLUE CROSS/BLUE SHIELD | Admitting: Orthopaedic Surgery

## 2019-01-23 ENCOUNTER — Ambulatory Visit (INDEPENDENT_AMBULATORY_CARE_PROVIDER_SITE_OTHER): Payer: BLUE CROSS/BLUE SHIELD

## 2019-01-23 ENCOUNTER — Encounter (INDEPENDENT_AMBULATORY_CARE_PROVIDER_SITE_OTHER): Payer: Self-pay | Admitting: Orthopaedic Surgery

## 2019-01-23 DIAGNOSIS — Z96653 Presence of artificial knee joint, bilateral: Secondary | ICD-10-CM

## 2019-01-23 DIAGNOSIS — M5442 Lumbago with sciatica, left side: Secondary | ICD-10-CM

## 2019-01-23 HISTORY — DX: Lumbago with sciatica, left side: M54.42

## 2019-01-23 MED ORDER — PREDNISONE 10 MG (21) PO TBPK: ORAL_TABLET | ORAL | 0 refills | Status: DC

## 2019-01-23 NOTE — Progress Notes (Signed)
Office Visit Note   Patient: Matthew Schmitt           Date of Birth: 06/08/1951           MRN: [id number] Visit Date: 01/23/2019              Requested by: Isaias Sakai, [address], [place] [address] PCP: Isaias Sakai, DO   Assessment & Plan: Visit Diagnoses:  1. Left-sided low back pain with left-sided sciatica, unspecified chronicity   2. History of bilateral knee replacement     Plan: Impression is #1 status post bilateral total knee replacement's and #2 lumbar radiculopathy.  I have discussed with the patient the popping that he is getting is from the metal hitting the plastic and is a normal occurrence from a knee arthroplasty.  I think the pain he is having is coming from his back.  I will start him on a Sterapred taper and send him to formal physical therapy.  He will follow-up with [place] in 1 year time for repeat evaluation of both knees.  He will certainly come back sooner if needed  Follow-Up Instructions: Return in about 1 year (around 01/23/2020).   Orders:  Orders Placed This Encounter  Procedures  . XR Knee 1-2 Views Left  . XR Knee 1-2 Views Right  . XR Lumbar Spine 2-3 Views   Meds ordered this encounter  Medications  . predniSONE (STERAPRED UNI-PAK 21 TAB) 10 MG (21) TBPK tablet    Sig: Take as directed    Dispense:  21 tablet    Refill:  0      Procedures: No procedures performed   Clinical Data: No additional findings.   Subjective: Chief Complaint  Patient presents with  . Right Knee - Follow-up  . Left Knee - Follow-up    HPI patient is a pleasant 68 year old gentleman who presents to our clinic today with concerns about both knees as well as his lower back.  He is status post left total knee replacement 02/07/2017 and right total knee replacement 04/25/2017.  Doing well in regards to both knees until about 6 months ago.  He noticed increased pain and occasional swelling to the left knee during the weekends  when he stepped up his activity.  When he has pain it is to the entire aspect.  He has taken Aleve with mild relief of symptoms.  He also notes an occasional popping sensation.  In regards to his lower back, he has had pain for the past year.  The pain is to both sides and is described as a moderately uncomfortable feeling.  He denies any weakness to either lower extremity.  No bowel or bladder change or saddle paresthesias.  The pain he has is worse when bending over.  No numbness, tingling or burning.  Review of Systems as detailed in HPI.  All others reviewed and are negative.   Objective: Vital Signs: There were no vitals taken for this visit.  Physical Exam well-developed and well-nourished gentleman in no acute distress.  Alert and oriented x3.  Ortho Exam examination of both knees reveals well-healed surgical incisions without evidence of infection or cellulitis.  No effusion.  Range of motion 0 to 100 degrees.  He is stable valgus varus stress.  Examination of his lumbar spine there is no spinous or paraspinous tenderness.  Negative straight leg raise.  No increased pain with lumbar flexion or extension.  Negative logroll both sides.  Specialty Comments:  No specialty comments available.  Imaging: Xr Knee 1-2 Views Left  Result Date: 01/23/2019 X-rays demonstrate a well-seated prosthesis without subsidence or ostial lysis  Xr Knee 1-2 Views Right  Result Date: 01/23/2019 X-rays demonstrate a well-seated prosthesis without subsidence or ostial lysis  Xr Lumbar Spine 2-3 Views  Result Date: 01/23/2019 X-rays demonstrate diffuse degenerative disc disease    PMFS History: Patient Active Problem List   Diagnosis Date Noted  . Left-sided low back pain with left-sided sciatica 01/23/2019  . Diabetes ([place]) 01/18/2018  . History of bilateral knee replacement 02/07/2017  . Primary osteoarthritis of left knee   . BPH with obstruction/lower urinary tract symptoms 01/04/2017  . Rotator  cuff syndrome of left shoulder 04/17/2016  . Nephrolithiasis 05/17/2014  . Encounter for long-term (current) use of other medications 07/27/2013  . Long term current use of anticoagulant therapy   . CAD (coronary artery disease) 04/02/2013  . Hypertensive heart disease 04/02/2013  . 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 Atrial flutter ([place])   . Nonischemic cardiomyopathy ([place])   . Obesity (BMI 30-39.9)   . History of kidney stones   . Osteoarthritis   . Hyperlipidemia    Past Medical History:  Diagnosis Date  . Anxiety   . Arthritis    everywhere  . Bladder calculus   . Coronary artery disease    cardiologist-  dr Wynonia Lawman  . Essential hypertension, benign   . First degree heart block   . Heart murmur   . History of kidney stones   . History of melanoma excision    June 2016  --- s/p  excision bilateral leg   . Impotence of organic origin   . Lower urinary tract symptoms (LUTS)   . Melanoma of lower leg, right ([place]) 2014  . Mixed hyperlipidemia    slightly  . Multiple renal cysts    bilateral  . PAF (paroxysmal atrial fibrillation) ([place])   . Presence of surgical incision    x2  bilateral leg and right thigh  . RBBB   . Renal cell carcinoma (HCC)    s/p  left partial nephrectomy  . Sleep apnea    no CPAP  . Type 2 diabetes mellitus ([place])    type2  . Wears glasses     Family History  Problem Relation Age of Onset  . Arthritis Father   . Hypertension Father   . Diabetes Father   . Arthritis Mother   . Hypertension Mother   . Heart attack Brother 4  . Diabetes Brother   . Lymphoma Paternal Grandfather   . Cancer Maternal Uncle   . Cancer Paternal Uncle        type unknown    Past Surgical History:  Procedure Laterality Date  . CARDIAC CATHETERIZATION  08-17-2011  DR SPENCER TILLEY   POSSIBLE MODERATE LAD STENOSIS JUST AFTER THE BIFURCATION OF LARGE DIAGONAL BRANCH/ LVEF  40-45%/ MILD GLOBAL HYPODINESIS (CATH DONE  FOR ABNORMAL STRESS TEST OF FIXED LATERAL WALL DEFECT & BIFASCICULAR BLAOCK)  . CARDIOVERSION N/A 04/02/2013   Procedure: CARDIOVERSION;  Surgeon: Jacolyn Reedy, MD;  Location: [facility];  Service: Cardiovascular;  Laterality: N/A;  . CYSTOSCOPY WITH LITHOLAPAXY N/A 05/18/2015   Procedure: CYSTOSCOPY WITH LITHOLAPAXY;  Surgeon: Rana Snare, MD;  Location[facility];  Service: Urology;  Laterality: N/A;  . CYSTOSCOPY WITH LITHOLAPAXY N/A 01/04/2017   Procedure: CYSTOSCOPY WITH LITHOLAPAXY, Holmium Laser;  Surgeon: Ardis Hughs,  MD;  [facility];  Service: Urology;  Laterality: N/A;  . CYSTOSCOPY WITH RETROGRADE PYELOGRAM, URETEROSCOPY AND STENT PLACEMENT  11/03/2012   Procedure: CYSTOSCOPY WITH RETROGRADE PYELOGRAM, URETEROSCOPY AND STENT PLACEMENT;  Surgeon: Bernestine Amass, MD;  Location: [facility];  Service: Urology;  Laterality: Left;  Cystoscopy/Left Retrograde/Ureteroscopy/Holmium Laser Litho/Double J Stent  . HOLMIUM LASER APPLICATION  12/87/8676   Procedure: HOLMIUM LASER APPLICATION;  Surgeon: Bernestine Amass, MD;  Location: [facility];  Service: Urology;  Laterality: Left;  . HOLMIUM LASER APPLICATION N/A 06/07/9469   Procedure: HOLMIUM LASER APPLICATION;  Surgeon: Rana Snare, MD;  Location: [facility];  Service: Urology;  Laterality: N/A;  . JOINT REPLACEMENT    . KNEE SURGERY Bilateral 1978  &  1974  . MELANOMA EXCISION Bilateral June 2016   20th-- left leg //  1st-- right leg w/ skin graft from right thigh (no lymph node bx)  . NEPHROLITHOTOMY Right 05/17/2014   Procedure: NEPHROLITHOTOMY PERCUTANEOUS;  Surgeon: Bernestine Amass, MD;  [facility];  Service: Urology;  Laterality: Right;  . PILONIDAL CYST EXCISION    . ROBOTIC ASSITED PARTIAL NEPHRECTOMY Left 06/18/2013   Procedure: ROBOTIC ASSISTED PARTIAL NEPHRECTOMY;  Surgeon: Dutch Gray, MD;  [facility];  Service: Urology;  Laterality: Left;  . ROTATOR  CUFF REPAIR Left 07/2016  . TONSILLECTOMY  65  (age 23)  . TOTAL KNEE ARTHROPLASTY Left 02/07/2017   Procedure: LEFT TOTAL KNEE ARTHROPLASTY;  Surgeon: Leandrew Koyanagi, MD;  Location: [facility];  Service: Orthopedics;  Laterality: Left;  . TOTAL KNEE ARTHROPLASTY Right 04/25/2017  . TOTAL KNEE ARTHROPLASTY Right 04/25/2017   Procedure: RIGHT TOTAL KNEE ARTHROPLASTY;  Surgeon: Leandrew Koyanagi, MD;  Location: [facility];  Service: Orthopedics;  Laterality: Right;  . TRANSURETHRAL RESECTION OF PROSTATE N/A 01/04/2017   Procedure: TRANSURETHRAL RESECTION OF THE PROSTATE (TURP);  Surgeon: Ardis Hughs, MD;  [facility];  Service: Urology;  Laterality: N/A;   Social History   Occupational History  . Occupation: Government social research officer, Psychologist, occupational  Tobacco Use  . Smoking status: Former Smoker    Packs/day: 1.00    Years: 6.00    Pack years: 6.00    Types: Cigarettes    Last attempt to quit: 10/31/1969    Years since quitting: 49.2  . Smokeless tobacco: Never Used  Substance and Sexual Activity  . Alcohol use: Yes    Alcohol/week: 0.0 standard drinks    Comment: OCCASIONAL  . Drug use: No  . Sexual activity: Never

## 2019-01-29 ENCOUNTER — Ambulatory Visit: Payer: BLUE CROSS/BLUE SHIELD | Admitting: Endocrinology

## 2019-02-04 ENCOUNTER — Encounter: Payer: Self-pay | Admitting: Endocrinology

## 2019-02-04 ENCOUNTER — Other Ambulatory Visit: Payer: Self-pay

## 2019-02-04 ENCOUNTER — Ambulatory Visit (INDEPENDENT_AMBULATORY_CARE_PROVIDER_SITE_OTHER): Payer: BLUE CROSS/BLUE SHIELD | Admitting: Endocrinology

## 2019-02-04 VITALS — BP 124/78 | HR 70 | Ht 71.0 in | Wt 244.0 lb

## 2019-02-04 DIAGNOSIS — E1159 Type 2 diabetes mellitus with other circulatory complications: Secondary | ICD-10-CM | POA: Diagnosis not present

## 2019-02-04 DIAGNOSIS — Z794 Long term (current) use of insulin: Secondary | ICD-10-CM | POA: Diagnosis not present

## 2019-02-04 LAB — POCT GLYCOSYLATED HEMOGLOBIN (HGB A1C): Hemoglobin A1C: 8.3 % — AB (ref 4.0–5.6)

## 2019-02-04 NOTE — Progress Notes (Signed)
Subjective:    Patient ID: Matthew Schmitt, male    DOB: 05/26/1951, 68 y.o.   MRN: [id number]  HPI Pt returns for f/u of diabetes mellitus: DM type: Insulin-requiring type 2. Dx'ed: 2002.   Complications: nephropathy, leg ulcers, and CAD.  Therapy: insulin since 2015, Farxiga, and Trulicity.   DKA: never.   Severe hypoglycemia: once, in 2017.  Pancreatitis: never.   Other: he declines multiple daily injections; he has severe insulin resistance.  In early 2017, he requested to change levemir to lantus, due to high dosage requirement.   Interval history: He says he misses the insulin 1-2 times per week. 2 weeks ago, pt took prednisone for arthralgias.  This increased cbg's to high-200's.  Otherwise, cbg's are well-controlled.   Past Medical History:  Diagnosis Date  . Anxiety   . Arthritis    everywhere  . Bladder calculus   . Coronary artery disease    cardiologist-  dr Wynonia Lawman  . Essential hypertension, benign   . First degree heart block   . Heart murmur   . History of kidney stones   . History of melanoma excision    June 2016  --- s/p  excision bilateral leg   . Impotence of organic origin   . Lower urinary tract symptoms (LUTS)   . Melanoma of lower leg, right ([place]) 2014  . Mixed hyperlipidemia    slightly  . Multiple renal cysts    bilateral  . PAF (paroxysmal atrial fibrillation) ([place])   . Presence of surgical incision    x2  bilateral leg and right thigh  . RBBB   . Renal cell carcinoma (HCC)    s/p  left partial nephrectomy  . Sleep apnea    no CPAP  . Type 2 diabetes mellitus ([place])    type2  . Wears glasses     Past Surgical History:  Procedure Laterality Date  . CARDIAC CATHETERIZATION  08-17-2011  DR SPENCER TILLEY   POSSIBLE MODERATE LAD STENOSIS JUST AFTER THE BIFURCATION OF LARGE DIAGONAL BRANCH/ LVEF  40-45%/ MILD GLOBAL HYPODINESIS (CATH DONE FOR ABNORMAL STRESS TEST OF FIXED LATERAL WALL DEFECT & BIFASCICULAR BLAOCK)  . CARDIOVERSION N/A  04/02/2013   Procedure: CARDIOVERSION;  Surgeon: Jacolyn Reedy, MD;  Location: [place];  Service: Cardiovascular;  Laterality: N/A;  . CYSTOSCOPY WITH LITHOLAPAXY N/A 05/18/2015   Procedure: CYSTOSCOPY WITH LITHOLAPAXY;  Surgeon: Rana Snare, MD;  Location: [facility];  Service: Urology;  Laterality: N/A;  . CYSTOSCOPY WITH LITHOLAPAXY N/A 01/04/2017   Procedure: CYSTOSCOPY WITH LITHOLAPAXY, Holmium Laser;  Surgeon: Ardis Hughs, MD;  [facility];  Service: Urology;  Laterality: N/A;  . CYSTOSCOPY WITH RETROGRADE PYELOGRAM, URETEROSCOPY AND STENT PLACEMENT  11/03/2012   Procedure: CYSTOSCOPY WITH RETROGRADE PYELOGRAM, URETEROSCOPY AND STENT PLACEMENT;  Surgeon: Bernestine Amass, MD;  Location: [facility];  Service: Urology;  Laterality: Left;  Cystoscopy/Left Retrograde/Ureteroscopy/Holmium Laser Litho/Double J Stent  . HOLMIUM LASER APPLICATION  53/29/9242   Procedure: HOLMIUM LASER APPLICATION;  Surgeon: Bernestine Amass, MD;  Location: [facility];  Service: Urology;  Laterality: Left;  . HOLMIUM LASER APPLICATION N/A 6/83/4196   Procedure: HOLMIUM LASER APPLICATION;  Surgeon: Rana Snare, MD;  Location: [facility];  Service: Urology;  Laterality: N/A;  . JOINT REPLACEMENT    . KNEE SURGERY Bilateral 1978  &  1974  . MELANOMA EXCISION Bilateral June 2016   20th-- left leg //  1st-- right  leg w/ skin graft from right thigh (no lymph node bx)  . NEPHROLITHOTOMY Right 05/17/2014   Procedure: NEPHROLITHOTOMY PERCUTANEOUS;  Surgeon: Bernestine Amass, MD;  [facility];  Service: Urology;  Laterality: Right;  . PILONIDAL CYST EXCISION    . ROBOTIC ASSITED PARTIAL NEPHRECTOMY Left 06/18/2013   Procedure: ROBOTIC ASSISTED PARTIAL NEPHRECTOMY;  Surgeon: Dutch Gray, MD;  [facility];  Service: Urology;  Laterality: Left;  . ROTATOR CUFF REPAIR Left 07/2016  . TONSILLECTOMY  68  (age 42)  . TOTAL KNEE ARTHROPLASTY Left  02/07/2017   Procedure: LEFT TOTAL KNEE ARTHROPLASTY;  Surgeon: Leandrew Koyanagi, MD;  Location: [place];  Service: Orthopedics;  Laterality: Left;  . TOTAL KNEE ARTHROPLASTY Right 04/25/2017  . TOTAL KNEE ARTHROPLASTY Right 04/25/2017   Procedure: RIGHT TOTAL KNEE ARTHROPLASTY;  Surgeon: Leandrew Koyanagi, MD;  Location: [place];  Service: Orthopedics;  Laterality: Right;  . TRANSURETHRAL RESECTION OF PROSTATE N/A 01/04/2017   Procedure: TRANSURETHRAL RESECTION OF THE PROSTATE (TURP);  Surgeon: Ardis Hughs, MD;  [facility];  Service: Urology;  Laterality: N/A;    Social History   Socioeconomic History  . Marital status: Married    Spouse name: Not on file  . Number of children: 2  . Years of education: 29  . Highest education level: Not on file  Occupational History  . Occupation: Government social research officer, Psychologist, occupational  Social Needs  . Financial resource strain: Not on file  . Food insecurity:    Worry: Not on file    Inability: Not on file  . Transportation needs:    Medical: Not on file    Non-medical: Not on file  Tobacco Use  . Smoking status: Former Smoker    Packs/day: 1.00    Years: 6.00    Pack years: 6.00    Types: Cigarettes    Last attempt to quit: 10/31/1969    Years since quitting: 49.2  . Smokeless tobacco: Never Used  Substance and Sexual Activity  . Alcohol use: Yes    Alcohol/week: 0.0 standard drinks    Comment: OCCASIONAL  . Drug use: No  . Sexual activity: Never  Lifestyle  . Physical activity:    Days per week: Not on file    Minutes per session: Not on file  . Stress: Not on file  Relationships  . Social connections:    Talks on phone: Not on file    Gets together: Not on file    Attends religious service: Not on file    Active member of club or organization: Not on file    Attends meetings of clubs or organizations: Not on file    Relationship status: Not on file  . Intimate partner violence:    Fear of current or ex partner: Not on file     Emotionally abused: Not on file    Physically abused: Not on file    Forced sexual activity: Not on file  Other Topics Concern  . Not on file  Social History Narrative   Regular exercise-no    Current Outpatient Medications on File Prior to Visit  Medication Sig Dispense Refill  . amLODipine (NORVASC) 10 MG tablet Take 1 tablet (10 mg total) by mouth daily. 90 tablet 0  . aspirin EC 81 MG tablet Take 81 mg by mouth daily.    . dapagliflozin propanediol (FARXIGA) 10 MG TABS tablet Take 10 mg by mouth daily. 90 tablet 3  . Dulaglutide (TRULICITY) 1.5 QM/0.8QP  SOPN Inject 1.5 mg into the skin once a week. 12 pen 3  . glucose blood (ONE TOUCH ULTRA TEST) test strip 1 each by Other route 2 (two) times daily. And lancets 2/day                        . 180 each 3  . Insulin Glargine (BASAGLAR KWIKPEN) 100 UNIT/ML SOPN Inject 1.8 mLs (180 Units total) into the skin daily. And pen needles 1/day 70 pen 3  . Menthol, Topical Analgesic, (BENGAY EX) Apply 1 application topically daily as needed (pain).    . metoprolol (LOPRESSOR) 50 MG tablet Take 50 mg by mouth 2 (two) times daily.     . predniSONE (STERAPRED UNI-PAK 21 TAB) 10 MG (21) TBPK tablet Take as directed 21 tablet 0  . sildenafil (VIAGRA) 100 MG tablet Take 100 mg by mouth daily as needed.  2  . spironolactone (ALDACTONE) 25 MG tablet Take 25 mg by mouth daily.    . valsartan-hydrochlorothiazide (DIOVAN-HCT) 320-25 MG per tablet Take 1 tablet by mouth every evening.      No current facility-administered medications on file prior to visit.     Allergies  Allergen Reactions  . Actos [Pioglitazone] Swelling    SWELLING REACTION UNSPECIFIED  EDEMA    Family History  Problem Relation Age of Onset  . Arthritis Father   . Hypertension Father   . Diabetes Father   . Arthritis Mother   . Hypertension Mother   . Heart attack Brother 64  . Diabetes Brother   . Lymphoma Paternal Grandfather   . Cancer Maternal Uncle   . Cancer Paternal  Uncle        type unknown    BP 124/78 (BP Location: Left Arm, Patient Position: Sitting, Cuff Size: Large)   Pulse 70   Ht 5\' 11"  (1.803 m)   Wt 244 lb (110.7 kg)   SpO2 93%   BMI 34.03 kg/m    Review of Systems He denies hypoglycemia    Objective:   Physical Exam VITAL SIGNS:  See vs page.  GENERAL: no distress.  Pulses: dorsalis pedis intact bilaterally.  Feet: no deformity. feet are of normal color and temp. There is severe hyperpigmentation of the ant tibial areas, and several abrasions there.  CV: trace bilat leg edema.  Skin: no open ulceron the feet, but there are several healed ulcers.   Neuro: sensation is intact to touch on the feet. Ext: There is bilateral onychomycosis of the toenails.    A1c=8.3%.      Assessment & Plan:  Noncompliance with insulin: we discussed.  Pt says he'll take as rx'ed.  Insulin-requiring type 2 DM, with CAD: worse.  Arthralgias: steroid rx is affecting a1c.   Patient Instructions  check your blood sugar once a day.  vary the time of day when you check, between before the 3 meals, and at bedtime.  also check if you have symptoms of your blood sugar being too high or too low.  please keep a record of the readings and bring it to your next appointment here.  please call us sooner if you are having low blood sugar episodes, or if it is running above 200.  On this type of insulin schedule, you should eat meals on a regular schedule.  If a meal is missed or significantly delayed, your blood sugar could go low.   Please continue the same diabetes medications.   To help you  remember the insulin, put it next to something you use each morning, such as your toothbrush. Please come back for a follow-up appointment in 2 months.

## 2019-02-04 NOTE — Patient Instructions (Addendum)
check your blood sugar once a day.  vary the time of day when you check, between before the 3 meals, and at bedtime.  also check if you have symptoms of your blood sugar being too high or too low.  please keep a record of the readings and bring it to your next appointment here.  please call us sooner if you are having low blood sugar episodes, or if it is running above 200.  On this type of insulin schedule, you should eat meals on a regular schedule.  If a meal is missed or significantly delayed, your blood sugar could go low.   Please continue the same diabetes medications.   To help you remember the insulin, put it next to something you use each morning, such as your toothbrush. Please come back for a follow-up appointment in 2 months.

## 2019-02-06 ENCOUNTER — Other Ambulatory Visit: Payer: Self-pay | Admitting: Endocrinology

## 2019-03-10 ENCOUNTER — Telehealth (INDEPENDENT_AMBULATORY_CARE_PROVIDER_SITE_OTHER): Payer: Self-pay | Admitting: Orthopaedic Surgery

## 2019-03-10 ENCOUNTER — Other Ambulatory Visit (INDEPENDENT_AMBULATORY_CARE_PROVIDER_SITE_OTHER): Payer: Self-pay

## 2019-03-10 DIAGNOSIS — M5442 Lumbago with sciatica, left side: Secondary | ICD-10-CM

## 2019-03-10 NOTE — Telephone Encounter (Signed)
See message below °

## 2019-03-10 NOTE — Telephone Encounter (Signed)
Called patient to let him know and confirmed it is for his lower back. MRI order made. Patient aware they are not doing nonemergent MRI's right now.

## 2019-03-10 NOTE — Telephone Encounter (Signed)
Pt calling, states he is no better and that is was discussed is his last ov that he would have MRI. He wants to proceed with having an MRI. pts callback [phone]

## 2019-03-10 NOTE — Telephone Encounter (Signed)
Please let him know about not being able to do nonemergent MRIs.  But we can certainly order it as soon as they lift the ban.  Please confirm with him that it's MRI L spine.  Thanks.

## 2019-04-17 ENCOUNTER — Other Ambulatory Visit: Payer: Self-pay | Admitting: Orthopaedic Surgery

## 2019-04-21 ENCOUNTER — Other Ambulatory Visit: Payer: BLUE CROSS/BLUE SHIELD

## 2019-04-22 ENCOUNTER — Other Ambulatory Visit: Payer: Self-pay

## 2019-04-22 ENCOUNTER — Ambulatory Visit
Admission: RE | Admit: 2019-04-22 | Discharge: 2019-04-22 | Disposition: A | Discharge status: Home / Self Care | Payer: BLUE CROSS/BLUE SHIELD | Source: Ambulatory Visit | Attending: Orthopaedic Surgery | Admitting: Orthopaedic Surgery

## 2019-04-22 DIAGNOSIS — M48061 Spinal stenosis, lumbar region without neurogenic claudication: Secondary | ICD-10-CM | POA: Diagnosis not present

## 2019-04-22 DIAGNOSIS — M5442 Lumbago with sciatica, left side: Secondary | ICD-10-CM

## 2019-04-23 ENCOUNTER — Other Ambulatory Visit: Payer: Self-pay

## 2019-04-23 DIAGNOSIS — N1330 Unspecified hydronephrosis: Secondary | ICD-10-CM

## 2019-04-23 NOTE — Progress Notes (Signed)
Hey man, I got an MRI of his back and found this right hydronephrosis.  I'm going to send over a referral so you can take a look at him.  Thanks.

## 2019-04-24 ENCOUNTER — Telehealth: Payer: Self-pay | Admitting: Orthopaedic Surgery

## 2019-04-24 NOTE — Telephone Encounter (Signed)
See message below °

## 2019-04-24 NOTE — Telephone Encounter (Signed)
Patient called stated had MRI and was seen by XU. He was advised to co to Urologist @ [facility] Dr. Louis Meckel and he has stated asked Dr Erlinda Hong to send images  To [facility] and to date they have not received anything.  Please call patient to advise the status of the images [phone]

## 2019-04-26 NOTE — Telephone Encounter (Signed)
I called and spoke with him.  Clarified that images are in epic already and that there's nothing that needs to be sent per se.  Dr. Louis Meckel is already aware of this also from the message I sent him last week.

## 2019-04-27 NOTE — Telephone Encounter (Signed)
Noted  

## 2019-05-01 DIAGNOSIS — R310 Gross hematuria: Secondary | ICD-10-CM | POA: Diagnosis not present

## 2019-05-01 DIAGNOSIS — N200 Calculus of kidney: Secondary | ICD-10-CM | POA: Diagnosis not present

## 2019-05-08 ENCOUNTER — Other Ambulatory Visit: Payer: Self-pay | Admitting: Urology

## 2019-05-12 ENCOUNTER — Other Ambulatory Visit (HOSPITAL_COMMUNITY)
Admission: RE | Admit: 2019-05-12 | Discharge: 2019-05-12 | Disposition: A | Discharge status: Home / Self Care | Payer: BC Managed Care – PPO | Source: Ambulatory Visit | Attending: Urology | Admitting: Urology

## 2019-05-12 DIAGNOSIS — Z1159 Encounter for screening for other viral diseases: Secondary | ICD-10-CM | POA: Insufficient documentation

## 2019-05-13 ENCOUNTER — Other Ambulatory Visit: Payer: Self-pay

## 2019-05-13 ENCOUNTER — Encounter (HOSPITAL_BASED_OUTPATIENT_CLINIC_OR_DEPARTMENT_OTHER): Payer: Self-pay | Admitting: *Deleted

## 2019-05-13 LAB — SARS CORONAVIRUS 2 (TAT 6-24 HRS): SARS Coronavirus 2: NEGATIVE

## 2019-05-13 NOTE — Progress Notes (Signed)
SPOKE W/  _  Pt via phone     SCREENING SYMPTOMS OF COVID 19:   COUGH--  no  RUNNY NOSE--- no  SORE THROAT--- no  NASAL CONGESTION---- no  SNEEZING---- no  SHORTNESS OF BREATH--- no  DIFFICULTY BREATHING--- no  TEMP >100.0 ----- no  UNEXPLAINED BODY ACHES------ no  CHILLS --------  no  HEADACHES --------- no  LOSS OF SMELL/ TASTE -------- no    HAVE YOU OR ANY FAMILY MEMBER TRAVELLED PAST 14 DAYS OUT OF THE   COUNTY--- no STATE---- no COUNTRY---- no  HAVE YOU OR ANY FAMILY MEMBER BEEN EXPOSED TO ANYONE WITH COVID 19?   denies  

## 2019-05-13 NOTE — Progress Notes (Addendum)
Spoke w/ pt via phone for pre-op interview.  Npo after mn.  Arrive at 0700.  Need istat 8.  Current ekg in chart and epic.  Will take lopressor and norvasc am dos w/ sips of water.  Pt had covid test done 05-12-2019.  Last cardiology note dated 11-27-2018 in epic, dr Bettina Gavia.   Pt takes asa,  per pt last dose 05-07-2019, given instructions by dr Louis Meckel office.  Chart to be reviewed by anesthesia, Konrad Felix PA.  ADDENDUM:  Per anesthesia,  Ok to proceed.

## 2019-05-14 NOTE — Anesthesia Preprocedure Evaluation (Addendum)
Anesthesia Evaluation  Patient identified by MRN, date of birth, ID band Patient awake    Reviewed: Allergy & Precautions, NPO status , Patient's Chart, lab work & pertinent test results  Airway Mallampati: II  TM Distance: >3 FB Neck ROM: Full    Dental no notable dental hx. (+) Teeth Intact, Dental Advisory Given   Pulmonary former smoker,    Pulmonary exam normal breath sounds clear to auscultation       Cardiovascular hypertension, + CAD  Normal cardiovascular exam+ dysrhythmias Atrial Fibrillation  Rhythm:Regular Rate:Normal     Neuro/Psych negative neurological ROS  negative psych ROS   GI/Hepatic negative GI ROS, Neg liver ROS,   Endo/Other  diabetes, Well Controlled, Type 2, Oral Hypoglycemic Agents  Renal/GU negative Renal ROSRenal Calculus  negative genitourinary   Musculoskeletal negative musculoskeletal ROS (+)   Abdominal   Peds negative pediatric ROS (+)  Hematology negative hematology ROS (+)   Anesthesia Other Findings   Reproductive/Obstetrics negative OB ROS                          Anesthesia Physical Anesthesia Plan  ASA: II  Anesthesia Plan: General   Post-op Pain Management:    Induction: Intravenous  PONV Risk Score and Plan: 2 and Treatment may vary due to age or medical condition, Ondansetron and Midazolam  Airway Management Planned: LMA and Oral ETT  Additional Equipment:   Intra-op Plan:   Post-operative Plan: Extubation in OR  Informed Consent: I have reviewed the patients History and Physical, chart, labs and discussed the procedure including the risks, benefits and alternatives for the proposed anesthesia with the patient or authorized representative who has indicated his/her understanding and acceptance.     Dental advisory given  Plan Discussed with: CRNA  Anesthesia Plan Comments: (See PAT note 05/14/2019, Konrad Felix, PA-C)       Anesthesia Quick Evaluation

## 2019-05-14 NOTE — Progress Notes (Signed)
Anesthesia Chart Review   Case: [id number] Date/Time: 05/15/19 0845   Procedure: CYSTOSCOPY WITH URETEROSCOPY, LASER LITHOTRIPSYSTONE BASKETRY AND STENT PLACEMENT (Right )   Anesthesia type: General   Pre-op diagnosis: RIGHT URETERAL STONE   Location: [facility] OR ROOM 2 / [place]   Surgeon: Ardis Hughs, MD      DISCUSSION: 68 yo former smoker (6 pack years, quit 10/31/69) with h/o HTN, RBBB, CAD, HLD, PAF, DM II, anxiety, BPH, right ureteral stone scheduled for above procedure 05/15/2019 with Dr. Louis Meckel.   Last seen by cardiologist, Dr. Shirlee More, 11/27/2018.  Pt stable at this visit with no medication changes, 6-9 month follow up.  He is taking ASA, reported to nurse stopped on 05/07/2019.   Anticipate pt can proceed with planned procedure barring acute status change and after evaluation DOS (SDW).   VS: Ht 5\' 11"  (1.803 m)   Wt 106.6 kg   BMI 32.78 kg/m   PROVIDERS: Isaias Sakai, DO is PCP   Shirlee More, MD is Cardiologist  LABS: SDW (all labs ordered are listed, but only abnormal results are displayed)  Labs Reviewed - No data to display   IMAGES:   EKG: 11/27/2018 73 bpm Sinus rhythm with premature atrial complexes in a pattern of bigeminy Right bundle branch block  Left anterior fascicular block  Bifascicular block  Left ventricular hypertrophy with repolarization abnormality Cannot rule out septal infarct, age undetermined   CV:  Past Medical History:  Diagnosis Date  . Anxiety   . Arthritis    everywhere  . BPH associated with nocturia   . Coronary artery disease    cardiologist-  dr Bettina Gavia (previouly dr Wynonia Lawman)  . Essential hypertension, benign   . First degree heart block   . Heart murmur   . History of bladder stone   . History of kidney stones   . History of melanoma excision    June 2016  --- s/p  excision right  leg (per pt localized and no recurrence)  . History of renal cell carcinoma 01/2017   s/p   left partial nephrecotmy  . Impotence of organic origin   . Mixed hyperlipidemia    slightly  . Multiple renal cysts    bilateral  . PAF (paroxysmal atrial fibrillation) ([place])    followed by cardiology  (takes ASA)  . RBBB   . Right ureteral stone   . Type 2 diabetes mellitus treated with insulin [facility])    endocrinologist-  dr Loanne Drilling  . Wears glasses     Past Surgical History:  Procedure Laterality Date  . CARDIAC CATHETERIZATION  08-17-2011  DR SPENCER TILLEY   POSSIBLE MODERATE LAD STENOSIS JUST AFTER THE BIFURCATION OF LARGE DIAGONAL BRANCH/ LVEF  40-45%/ MILD GLOBAL HYPODINESIS (CATH DONE FOR ABNORMAL STRESS TEST OF FIXED LATERAL WALL DEFECT & BIFASCICULAR BLAOCK)  . CARDIOVERSION N/A 04/02/2013   Procedure: CARDIOVERSION;  Surgeon: Jacolyn Reedy, MD;  Location: [place];  Service: Cardiovascular;  Laterality: N/A;  . CYSTOSCOPY WITH LITHOLAPAXY N/A 05/18/2015   Procedure: CYSTOSCOPY WITH LITHOLAPAXY;  Surgeon: Rana Snare, MD;  Location[facility];  Service: Urology;  Laterality: N/A;  . CYSTOSCOPY WITH LITHOLAPAXY N/A 01/04/2017   Procedure: CYSTOSCOPY WITH LITHOLAPAXY, Holmium Laser;  Surgeon: Ardis Hughs, MD;  [facility];  Service: Urology;  Laterality: N/A;  . CYSTOSCOPY WITH RETROGRADE PYELOGRAM, URETEROSCOPY AND STENT PLACEMENT  11/03/2012   Procedure: CYSTOSCOPY WITH RETROGRADE PYELOGRAM, URETEROSCOPY AND STENT PLACEMENT;  Surgeon: Bernestine Amass, MD;  Location: [facility];  Service: Urology;  Laterality: Left;  Cystoscopy/Left Retrograde/Ureteroscopy/Holmium Laser Litho/Double J Stent  . HOLMIUM LASER APPLICATION  16/08/9603   Procedure: HOLMIUM LASER APPLICATION;  Surgeon: Bernestine Amass, MD;  Location: [facility];  Service: Urology;  Laterality: Left;  . HOLMIUM LASER APPLICATION N/A 5/40/9811   Procedure: HOLMIUM LASER APPLICATION;  Surgeon: Rana Snare, MD;  Location: [facility];  Service:  Urology;  Laterality: N/A;  . KNEE SURGERY Bilateral [place]  . MELANOMA EXCISION Bilateral June 2016   20th-- left leg //  1st-- right leg w/ skin graft from right thigh (no lymph node bx)  . NEPHROLITHOTOMY Right 05/17/2014   Procedure: NEPHROLITHOTOMY PERCUTANEOUS;  Surgeon: Bernestine Amass, MD;  [facility];  Service: Urology;  Laterality: Right;  . PILONIDAL CYST EXCISION    . ROBOTIC ASSITED PARTIAL NEPHRECTOMY Left 06/18/2013   Procedure: ROBOTIC ASSISTED PARTIAL NEPHRECTOMY;  Surgeon: Dutch Gray, MD;  [facility];  Service: Urology;  Laterality: Left;  . ROTATOR CUFF REPAIR Left 07/2016  . TONSILLECTOMY  75  (age 89)  . TOTAL KNEE ARTHROPLASTY Left 02/07/2017   Procedure: LEFT TOTAL KNEE ARTHROPLASTY;  Surgeon: Leandrew Koyanagi, MD;  Location: [place];  Service: Orthopedics;  Laterality: Left;  . TOTAL KNEE ARTHROPLASTY Right 04/25/2017   Procedure: RIGHT TOTAL KNEE ARTHROPLASTY;  Surgeon: Leandrew Koyanagi, MD;  Location: [place];  Service: Orthopedics;  Laterality: Right;  . TRANSURETHRAL RESECTION OF PROSTATE N/A 01/04/2017   Procedure: TRANSURETHRAL RESECTION OF THE PROSTATE (TURP);  Surgeon: Ardis Hughs, MD;  [facility];  Service: Urology;  Laterality: N/A;    MEDICATIONS: No current facility-administered medications for this encounter.    Marland Kitchen amLODipine (NORVASC) 10 MG tablet  . dapagliflozin propanediol (FARXIGA) 10 MG TABS tablet  . Insulin Glargine (BASAGLAR KWIKPEN) 100 UNIT/ML SOPN  . Menthol, Topical Analgesic, (BENGAY EX)  . metoprolol tartrate (LOPRESSOR) 100 MG tablet  . sildenafil (VIAGRA) 100 MG tablet  . TRULICITY 1.5 BJ/4.7WG SOPN  . valsartan-hydrochlorothiazide (DIOVAN-HCT) 320-25 MG per tablet  . aspirin EC 81 MG tablet  . glucose blood (ONE TOUCH ULTRA TEST) test strip     Konrad Felix, PA-C [facility] [phone] 05/14/19  9:24 AM

## 2019-05-15 ENCOUNTER — Ambulatory Visit (HOSPITAL_BASED_OUTPATIENT_CLINIC_OR_DEPARTMENT_OTHER): Payer: BC Managed Care – PPO | Admitting: Physician Assistant

## 2019-05-15 ENCOUNTER — Encounter (HOSPITAL_BASED_OUTPATIENT_CLINIC_OR_DEPARTMENT_OTHER): Payer: Self-pay | Admitting: Anesthesiology

## 2019-05-15 ENCOUNTER — Ambulatory Visit (HOSPITAL_BASED_OUTPATIENT_CLINIC_OR_DEPARTMENT_OTHER)
Admission: RE | Admit: 2019-05-15 | Discharge: 2019-05-15 | Disposition: A | Discharge status: Home / Self Care | Payer: BC Managed Care – PPO | Attending: Urology | Admitting: Urology

## 2019-05-15 ENCOUNTER — Encounter (HOSPITAL_BASED_OUTPATIENT_CLINIC_OR_DEPARTMENT_OTHER)
Admission: RE | Disposition: A | Discharge status: Home / Self Care | Payer: Self-pay | Source: Home / Self Care | Attending: Urology

## 2019-05-15 DIAGNOSIS — I480 Paroxysmal atrial fibrillation: Secondary | ICD-10-CM | POA: Insufficient documentation

## 2019-05-15 DIAGNOSIS — Z7982 Long term (current) use of aspirin: Secondary | ICD-10-CM | POA: Insufficient documentation

## 2019-05-15 DIAGNOSIS — I4510 Unspecified right bundle-branch block: Secondary | ICD-10-CM | POA: Insufficient documentation

## 2019-05-15 DIAGNOSIS — E119 Type 2 diabetes mellitus without complications: Secondary | ICD-10-CM | POA: Insufficient documentation

## 2019-05-15 DIAGNOSIS — Z6832 Body mass index (BMI) 32.0-32.9, adult: Secondary | ICD-10-CM | POA: Insufficient documentation

## 2019-05-15 DIAGNOSIS — Z85820 Personal history of malignant melanoma of skin: Secondary | ICD-10-CM | POA: Insufficient documentation

## 2019-05-15 DIAGNOSIS — Z905 Acquired absence of kidney: Secondary | ICD-10-CM | POA: Insufficient documentation

## 2019-05-15 DIAGNOSIS — I10 Essential (primary) hypertension: Secondary | ICD-10-CM | POA: Insufficient documentation

## 2019-05-15 DIAGNOSIS — N132 Hydronephrosis with renal and ureteral calculous obstruction: Secondary | ICD-10-CM | POA: Insufficient documentation

## 2019-05-15 DIAGNOSIS — I4519 Other right bundle-branch block: Secondary | ICD-10-CM | POA: Diagnosis not present

## 2019-05-15 DIAGNOSIS — Z794 Long term (current) use of insulin: Secondary | ICD-10-CM | POA: Diagnosis not present

## 2019-05-15 DIAGNOSIS — Z87891 Personal history of nicotine dependence: Secondary | ICD-10-CM | POA: Insufficient documentation

## 2019-05-15 DIAGNOSIS — M1990 Unspecified osteoarthritis, unspecified site: Secondary | ICD-10-CM | POA: Diagnosis not present

## 2019-05-15 DIAGNOSIS — Z85528 Personal history of other malignant neoplasm of kidney: Secondary | ICD-10-CM | POA: Diagnosis not present

## 2019-05-15 DIAGNOSIS — I428 Other cardiomyopathies: Secondary | ICD-10-CM | POA: Diagnosis not present

## 2019-05-15 DIAGNOSIS — Z79899 Other long term (current) drug therapy: Secondary | ICD-10-CM | POA: Diagnosis not present

## 2019-05-15 DIAGNOSIS — Z7901 Long term (current) use of anticoagulants: Secondary | ICD-10-CM | POA: Diagnosis not present

## 2019-05-15 DIAGNOSIS — I4892 Unspecified atrial flutter: Secondary | ICD-10-CM | POA: Insufficient documentation

## 2019-05-15 DIAGNOSIS — I2510 Atherosclerotic heart disease of native coronary artery without angina pectoris: Secondary | ICD-10-CM | POA: Insufficient documentation

## 2019-05-15 DIAGNOSIS — Z96653 Presence of artificial knee joint, bilateral: Secondary | ICD-10-CM | POA: Diagnosis not present

## 2019-05-15 DIAGNOSIS — I429 Cardiomyopathy, unspecified: Secondary | ICD-10-CM | POA: Diagnosis not present

## 2019-05-15 DIAGNOSIS — Z833 Family history of diabetes mellitus: Secondary | ICD-10-CM | POA: Diagnosis not present

## 2019-05-15 DIAGNOSIS — E669 Obesity, unspecified: Secondary | ICD-10-CM | POA: Insufficient documentation

## 2019-05-15 DIAGNOSIS — Z8249 Family history of ischemic heart disease and other diseases of the circulatory system: Secondary | ICD-10-CM | POA: Insufficient documentation

## 2019-05-15 DIAGNOSIS — N200 Calculus of kidney: Secondary | ICD-10-CM

## 2019-05-15 DIAGNOSIS — N201 Calculus of ureter: Secondary | ICD-10-CM | POA: Diagnosis not present

## 2019-05-15 HISTORY — PX: CYSTOSCOPY WITH URETEROSCOPY, STONE BASKETRY AND STENT PLACEMENT: SHX6378

## 2019-05-15 HISTORY — DX: Type 2 diabetes mellitus without complications: E11.9

## 2019-05-15 HISTORY — DX: Benign prostatic hyperplasia with lower urinary tract symptoms: N40.1

## 2019-05-15 HISTORY — DX: Type 2 diabetes mellitus without complications: Z79.4

## 2019-05-15 HISTORY — DX: Personal history of other diseases of urinary system: Z87.448

## 2019-05-15 HISTORY — DX: Calculus of ureter: N20.1

## 2019-05-15 LAB — POCT I-STAT, CHEM 8
BUN: 29 mg/dL — ABNORMAL HIGH (ref 8–23)
Calcium, Ion: 1.27 mmol/L (ref 1.15–1.40)
Chloride: 105 mmol/L (ref 98–111)
Creatinine, Ser: 1.5 mg/dL — ABNORMAL HIGH (ref 0.61–1.24)
Glucose, Bld: 110 mg/dL — ABNORMAL HIGH (ref 70–99)
HCT: 44 % (ref 39.0–52.0)
Hemoglobin: 15 g/dL (ref 13.0–17.0)
Potassium: 3.7 mmol/L (ref 3.5–5.1)
Sodium: 141 mmol/L (ref 135–145)
TCO2: 26 mmol/L (ref 22–32)

## 2019-05-15 LAB — GLUCOSE, CAPILLARY: Glucose-Capillary: 124 mg/dL — ABNORMAL HIGH (ref 70–99)

## 2019-05-15 SURGERY — CYSTOSCOPY, WITH CALCULUS MANIPULATION OR REMOVAL
Anesthesia: General | Site: Ureter | Laterality: Right

## 2019-05-15 MED ORDER — FENTANYL CITRATE (PF) 100 MCG/2ML IJ SOLN
INTRAMUSCULAR | Status: DC | PRN
  Administered 2019-05-15 (×2): 50 ug via INTRAVENOUS

## 2019-05-15 MED ORDER — FENTANYL CITRATE (PF) 100 MCG/2ML IJ SOLN
INTRAMUSCULAR | Status: AC
  Filled 2019-05-15: qty 2

## 2019-05-15 MED ORDER — TRAMADOL HCL 50 MG PO TABS: 50.0000 mg | ORAL_TABLET | Freq: Four times a day (QID) | ORAL | 0 refills | Status: DC | PRN

## 2019-05-15 MED ORDER — KETOROLAC TROMETHAMINE 30 MG/ML IJ SOLN
INTRAMUSCULAR | Status: AC
  Filled 2019-05-15: qty 1

## 2019-05-15 MED ORDER — MIDAZOLAM HCL 2 MG/2ML IJ SOLN
INTRAMUSCULAR | Status: AC
  Filled 2019-05-15: qty 2

## 2019-05-15 MED ORDER — ONDANSETRON HCL 4 MG/2ML IJ SOLN
INTRAMUSCULAR | Status: AC
  Filled 2019-05-15: qty 2

## 2019-05-15 MED ORDER — BELLADONNA ALKALOIDS-OPIUM 16.2-60 MG RE SUPP
RECTAL | Status: AC
  Filled 2019-05-15: qty 1

## 2019-05-15 MED ORDER — PROPOFOL 10 MG/ML IV BOLUS
INTRAVENOUS | Status: DC | PRN
  Administered 2019-05-15: 150 mg via INTRAVENOUS

## 2019-05-15 MED ORDER — LACTATED RINGERS IV SOLN
INTRAVENOUS | Status: DC
  Administered 2019-05-15: 08:00:00 via INTRAVENOUS
  Filled 2019-05-15: qty 1000

## 2019-05-15 MED ORDER — CEFAZOLIN SODIUM-DEXTROSE 2-4 GM/100ML-% IV SOLN
INTRAVENOUS | Status: AC
  Filled 2019-05-15: qty 100

## 2019-05-15 MED ORDER — PROPOFOL 500 MG/50ML IV EMUL
INTRAVENOUS | Status: AC
  Filled 2019-05-15: qty 50

## 2019-05-15 MED ORDER — ACETAMINOPHEN 500 MG PO TABS
ORAL_TABLET | ORAL | Status: AC
  Filled 2019-05-15: qty 2

## 2019-05-15 MED ORDER — IOHEXOL 300 MG/ML  SOLN
INTRAMUSCULAR | Status: DC | PRN
  Administered 2019-05-15: 13 mL via URETHRAL

## 2019-05-15 MED ORDER — MIDAZOLAM HCL 2 MG/2ML IJ SOLN
INTRAMUSCULAR | Status: DC | PRN
  Administered 2019-05-15: 2 mg via INTRAVENOUS

## 2019-05-15 MED ORDER — DEXAMETHASONE SODIUM PHOSPHATE 10 MG/ML IJ SOLN
INTRAMUSCULAR | Status: DC | PRN
  Administered 2019-05-15: 10 mg via INTRAVENOUS

## 2019-05-15 MED ORDER — LIDOCAINE 2% (20 MG/ML) 5 ML SYRINGE
INTRAMUSCULAR | Status: AC
  Filled 2019-05-15: qty 5

## 2019-05-15 MED ORDER — ARTIFICIAL TEARS OPHTHALMIC OINT
TOPICAL_OINTMENT | OPHTHALMIC | Status: AC
  Filled 2019-05-15: qty 3.5

## 2019-05-15 MED ORDER — EPHEDRINE 5 MG/ML INJ
INTRAVENOUS | Status: AC
  Filled 2019-05-15: qty 10

## 2019-05-15 MED ORDER — METOCLOPRAMIDE HCL 5 MG/ML IJ SOLN
10.0000 mg | Freq: Once | INTRAMUSCULAR | Status: DC | PRN
  Filled 2019-05-15: qty 2

## 2019-05-15 MED ORDER — MEPERIDINE HCL 25 MG/ML IJ SOLN
6.2500 mg | INTRAMUSCULAR | Status: DC | PRN
  Filled 2019-05-15: qty 1

## 2019-05-15 MED ORDER — PHENAZOPYRIDINE HCL 200 MG PO TABS: 200.0000 mg | ORAL_TABLET | Freq: Three times a day (TID) | ORAL | 0 refills | Status: DC | PRN

## 2019-05-15 MED ORDER — ONDANSETRON HCL 4 MG/2ML IJ SOLN
INTRAMUSCULAR | Status: DC | PRN
  Administered 2019-05-15: 4 mg via INTRAVENOUS

## 2019-05-15 MED ORDER — FENTANYL CITRATE (PF) 100 MCG/2ML IJ SOLN
25.0000 ug | INTRAMUSCULAR | Status: DC | PRN
  Filled 2019-05-15: qty 1

## 2019-05-15 MED ORDER — EPHEDRINE SULFATE 50 MG/ML IJ SOLN
INTRAMUSCULAR | Status: DC | PRN
  Administered 2019-05-15 (×2): 10 mg via INTRAVENOUS

## 2019-05-15 MED ORDER — LIDOCAINE 2% (20 MG/ML) 5 ML SYRINGE
INTRAMUSCULAR | Status: DC | PRN
  Administered 2019-05-15: 100 mg via INTRAVENOUS

## 2019-05-15 MED ORDER — CEFAZOLIN SODIUM-DEXTROSE 2-4 GM/100ML-% IV SOLN
2.0000 g | INTRAVENOUS | Status: AC
  Administered 2019-05-15: 2 g via INTRAVENOUS
  Filled 2019-05-15: qty 100

## 2019-05-15 MED ORDER — DEXAMETHASONE SODIUM PHOSPHATE 10 MG/ML IJ SOLN
INTRAMUSCULAR | Status: AC
  Filled 2019-05-15: qty 1

## 2019-05-15 MED ORDER — LACTATED RINGERS IV SOLN
INTRAVENOUS | Status: DC
  Filled 2019-05-15: qty 1000

## 2019-05-15 MED ORDER — BELLADONNA ALKALOIDS-OPIUM 16.2-60 MG RE SUPP
RECTAL | Status: DC | PRN
  Administered 2019-05-15: 1 via RECTAL

## 2019-05-15 MED ORDER — KETOROLAC TROMETHAMINE 30 MG/ML IJ SOLN
INTRAMUSCULAR | Status: DC | PRN
  Administered 2019-05-15: 30 mg via INTRAVENOUS

## 2019-05-15 MED ORDER — ACETAMINOPHEN 500 MG PO TABS
1000.0000 mg | ORAL_TABLET | Freq: Once | ORAL | Status: AC
  Administered 2019-05-15: 1000 mg via ORAL
  Filled 2019-05-15: qty 2

## 2019-05-15 MED ORDER — SODIUM CHLORIDE 0.9 % IR SOLN
Status: DC | PRN
  Administered 2019-05-15 (×2): 3000 mL

## 2019-05-15 MED ORDER — CIPROFLOXACIN HCL 500 MG PO TABS: 500.0000 mg | ORAL_TABLET | Freq: Once | ORAL | 0 refills | Status: AC

## 2019-05-15 SURGICAL SUPPLY — 30 items
APL SKNCLS STERI-STRIP NONHPOA (GAUZE/BANDAGES/DRESSINGS) ×1
BAG DRAIN URO-CYSTO SKYTR STRL (DRAIN) ×2 IMPLANT
BAG DRN UROCATH (DRAIN) ×1
BASKET LASER NITINOL 1.9FR (BASKET) IMPLANT
BASKET STONE 1.7 NGAGE (UROLOGICAL SUPPLIES) ×1 IMPLANT
BENZOIN TINCTURE PRP APPL 2/3 (GAUZE/BANDAGES/DRESSINGS) ×1 IMPLANT
BSKT STON RTRVL 120 1.9FR (BASKET)
CATH URET 5FR 28IN OPEN ENDED (CATHETERS) ×2 IMPLANT
CATH URET DUAL LUMEN 6-10FR 50 (CATHETERS) IMPLANT
CLOTH BEACON ORANGE TIMEOUT ST (SAFETY) ×2 IMPLANT
DRSG TEGADERM 2-3/8X2-3/4 SM (GAUZE/BANDAGES/DRESSINGS) ×1 IMPLANT
EXTRACTOR STONE 1.7FRX115CM (UROLOGICAL SUPPLIES) IMPLANT
FIBER LASER TRAC TIP (UROLOGICAL SUPPLIES) ×1 IMPLANT
GLOVE BIO SURGEON STRL SZ 6.5 (GLOVE) ×1 IMPLANT
GLOVE BIO SURGEON STRL SZ7.5 (GLOVE) ×2 IMPLANT
GLOVE BIOGEL PI IND STRL 6.5 (GLOVE) IMPLANT
GLOVE BIOGEL PI INDICATOR 6.5 (GLOVE) ×1
GOWN STRL REUS W/ TWL LRG LVL3 (GOWN DISPOSABLE) IMPLANT
GOWN STRL REUS W/TWL LRG LVL3 (GOWN DISPOSABLE) ×2
GOWN STRL REUS W/TWL XL LVL3 (GOWN DISPOSABLE) ×2 IMPLANT
GUIDEWIRE ANG ZIPWIRE 038X150 (WIRE) IMPLANT
GUIDEWIRE STR DUAL SENSOR (WIRE) ×3 IMPLANT
IV NS IRRIG 3000ML ARTHROMATIC (IV SOLUTION) ×4 IMPLANT
KIT TURNOVER CYSTO (KITS) ×2 IMPLANT
MANIFOLD NEPTUNE II (INSTRUMENTS) ×1 IMPLANT
NS IRRIG 500ML POUR BTL (IV SOLUTION) ×2 IMPLANT
PACK CYSTO (CUSTOM PROCEDURE TRAY) ×2 IMPLANT
STENT URET 6FRX26 CONTOUR (STENTS) ×1 IMPLANT
TUBE CONNECTING 12X1/4 (SUCTIONS) ×1 IMPLANT
TUBING UROLOGY SET (TUBING) ×2 IMPLANT

## 2019-05-15 NOTE — H&P (Signed)
History of present illness: 68M with incidental finding of right hydronephrosis on an MRI for back pain.  CT scan was then performed and this demonstrated a 69mm stone in the right mid ureter.  The patient's pain has been mostly midline and low back in nature.  He denies any fevers or chills.  Review of systems: A 12 point comprehensive review of systems was obtained and is negative unless otherwise stated in the history of present illness.  Patient Active Problem List   Diagnosis Date Noted  . Left-sided low back pain with left-sided sciatica 01/23/2019  . Diabetes ([place]) 01/18/2018  . History of bilateral knee replacement 02/07/2017  . Primary osteoarthritis of left knee   . BPH with obstruction/lower urinary tract symptoms 01/04/2017  . Rotator cuff syndrome of left shoulder 04/17/2016  . Nephrolithiasis 05/17/2014  . Encounter for long-term (current) use of other medications 07/27/2013  . Long term current use of anticoagulant therapy   . CAD (coronary artery disease) 04/02/2013  . Hypertensive heart disease 04/02/2013  . 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 Atrial flutter ([place])   . Nonischemic cardiomyopathy ([place])   . Obesity (BMI 30-39.9)   . History of kidney stones   . Osteoarthritis   . Hyperlipidemia     No current facility-administered medications on file prior to encounter.    Current Outpatient Medications on File Prior to Encounter  Medication Sig Dispense Refill  . amLODipine (NORVASC) 10 MG tablet Take 1 tablet (10 mg total) by mouth daily. (Patient taking differently: Take 10 mg by mouth daily. ) 90 tablet 0  . dapagliflozin propanediol (FARXIGA) 10 MG TABS tablet Take 10 mg by mouth daily. (Patient taking differently: Take 10 mg by mouth daily. ) 90 tablet 3  . Insulin Glargine (BASAGLAR KWIKPEN) 100 UNIT/ML SOPN Inject 1.8 mLs (180 Units total) into the skin daily. And pen needles 1/day (Patient taking differently: Inject  150 Units into the skin daily. And pen needles 1/day) 70 pen 3  . Menthol, Topical Analgesic, (BENGAY EX) Apply 1 application topically daily as needed (pain).    . metoprolol tartrate (LOPRESSOR) 100 MG tablet Take 100 mg by mouth daily at 12 noon.    . sildenafil (VIAGRA) 100 MG tablet Take 100 mg by mouth daily as needed.  2  . TRULICITY 1.5 TK/2.4OX SOPN INJECT 1.5 MG UNDER THE SKIN ONCE A WEEK (Patient taking differently: No sig reported) 6 mL 3  . valsartan-hydrochlorothiazide (DIOVAN-HCT) 320-25 MG per tablet Take 1 tablet by mouth daily.     Marland Kitchen aspirin EC 81 MG tablet Take 81 mg by mouth daily.    Marland Kitchen glucose blood (ONE TOUCH ULTRA TEST) test strip 1 each by Other route 2 (two) times daily. And lancets 2/day                        . 180 each 3    Past Medical History:  Diagnosis Date  . Anxiety   . Arthritis    everywhere  . BPH associated with nocturia   . Coronary artery disease    cardiologist-  dr Bettina Gavia (previouly dr Wynonia Lawman)  . Essential hypertension, benign   . First degree heart block   . Heart murmur   . History of bladder stone   . History of kidney stones   . History of melanoma excision    June 2016  --- s/p  excision right  leg (per  pt localized and no recurrence)  . History of renal cell carcinoma 01/2017   s/p  left partial nephrecotmy  . Impotence of organic origin   . Mixed hyperlipidemia    slightly  . Multiple renal cysts    bilateral  . PAF (paroxysmal atrial fibrillation) ([place])    followed by cardiology  (takes ASA)  . RBBB   . Right ureteral stone   . Type 2 diabetes mellitus treated with insulin [facility])    endocrinologist-  dr Loanne Drilling  . Wears glasses     Past Surgical History:  Procedure Laterality Date  . CARDIAC CATHETERIZATION  08-17-2011  DR SPENCER TILLEY   POSSIBLE MODERATE LAD STENOSIS JUST AFTER THE BIFURCATION OF LARGE DIAGONAL BRANCH/ LVEF  40-45%/ MILD GLOBAL HYPODINESIS (CATH DONE FOR ABNORMAL STRESS TEST OF FIXED LATERAL WALL DEFECT &  BIFASCICULAR BLAOCK)  . CARDIOVERSION N/A 04/02/2013   Procedure: CARDIOVERSION;  Surgeon: Jacolyn Reedy, MD;  Location: [place];  Service: Cardiovascular;  Laterality: N/A;  . CYSTOSCOPY WITH LITHOLAPAXY N/A 05/18/2015   Procedure: CYSTOSCOPY WITH LITHOLAPAXY;  Surgeon: Rana Snare, MD;  Location: [facility];  Service: Urology;  Laterality: N/A;  . CYSTOSCOPY WITH LITHOLAPAXY N/A 01/04/2017   Procedure: CYSTOSCOPY WITH LITHOLAPAXY, Holmium Laser;  Surgeon: Ardis Hughs, MD;  Location: [facility];  Service: Urology;  Laterality: N/A;  . CYSTOSCOPY WITH RETROGRADE PYELOGRAM, URETEROSCOPY AND STENT PLACEMENT  11/03/2012   Procedure: CYSTOSCOPY WITH RETROGRADE PYELOGRAM, URETEROSCOPY AND STENT PLACEMENT;  Surgeon: Bernestine Amass, MD;  Location[facility];  Service: Urology;  Laterality: Left;  Cystoscopy/Left Retrograde/Ureteroscopy/Holmium Laser Litho/Double J Stent  . HOLMIUM LASER APPLICATION  69/48/5462   Procedure: HOLMIUM LASER APPLICATION;  Surgeon: Bernestine Amass, MD;  Location: [facility];  Service: Urology;  Laterality: Left;  . HOLMIUM LASER APPLICATION N/A 05/21/5008   Procedure: HOLMIUM LASER APPLICATION;  Surgeon: Rana Snare, MD;  Location: [facility];  Service: Urology;  Laterality: N/A;  . KNEE SURGERY Bilateral [place]  . MELANOMA EXCISION Bilateral June 2016   20th-- left leg //  1st-- right leg w/ skin graft from right thigh (no lymph node bx)  . NEPHROLITHOTOMY Right 05/17/2014   Procedure: NEPHROLITHOTOMY PERCUTANEOUS;  Surgeon: Bernestine Amass, MD;  Location: [facility];  Service: Urology;  Laterality: Right;  . PILONIDAL CYST EXCISION    . ROBOTIC ASSITED PARTIAL NEPHRECTOMY Left 06/18/2013   Procedure: ROBOTIC ASSISTED PARTIAL NEPHRECTOMY;  Surgeon: Dutch Gray, MD;  Location: [facility];  Service: Urology;  Laterality: Left;  . ROTATOR CUFF REPAIR Left 07/2016  . TONSILLECTOMY  59  (age 70)  . TOTAL KNEE  ARTHROPLASTY Left 02/07/2017   Procedure: LEFT TOTAL KNEE ARTHROPLASTY;  Surgeon: Leandrew Koyanagi, MD;  Location: [place];  Service: Orthopedics;  Laterality: Left;  . TOTAL KNEE ARTHROPLASTY Right 04/25/2017   Procedure: RIGHT TOTAL KNEE ARTHROPLASTY;  Surgeon: Leandrew Koyanagi, MD;  Location: [place];  Service: Orthopedics;  Laterality: Right;  . TRANSURETHRAL RESECTION OF PROSTATE N/A 01/04/2017   Procedure: TRANSURETHRAL RESECTION OF THE PROSTATE (TURP);  Surgeon: Ardis Hughs, MD;  Location[facility];  Service: Urology;  Laterality: N/A;    Social History   Tobacco Use  . Smoking status: Former Smoker    Packs/day: 1.00    Years: 6.00    Pack years: 6.00    Types: Cigarettes    Quit date: 10/31/1969    Years since quitting: 49.5  .  Smokeless tobacco: Never Used  Substance Use Topics  . Alcohol use: Yes    Alcohol/week: 0.0 standard drinks    Comment: OCCASIONAL  . Drug use: No    Family History  Problem Relation Age of Onset  . Arthritis Father   . Hypertension Father   . Diabetes Father   . Arthritis Mother   . Hypertension Mother   . Heart attack Brother 60  . Diabetes Brother   . Lymphoma Paternal Grandfather   . Cancer Maternal Uncle   . Cancer Paternal Uncle        type unknown    PE: Vitals:   05/13/19 1425  Weight: 106.6 kg  Height: 5\' 11"  (1.803 m)   Patient appears to be in no acute distress  patient is alert and oriented x3 Atraumatic normocephalic head No cervical or supraclavicular lymphadenopathy appreciated No increased work of breathing, no audible wheezes/rhonchi Regular sinus rhythm/rate Abdomen is soft, nontender, nondistended, no CVA or suprapubic tenderness Lower extremities are symmetric without appreciable edema Grossly neurologically intact No identifiable skin lesions  No results for input(s): WBC, HGB, HCT in the last 72 hours. No results for input(s): NA, K, CL, CO2, GLUCOSE, BUN, CREATININE, CALCIUM in the last 72 hours. No results for  input(s): LABPT, INR in the last 72 hours. No results for input(s): LABURIN in the last 72 hours. Results for orders placed or performed during the hospital encounter of 05/12/19  SARS Coronavirus 2 (Performed in [place] hospital lab)     Status: None   Collection Time: 05/12/19  2:47 PM   Specimen: Nasal Swab  Result Value Ref Range Status   SARS Coronavirus 2 NEGATIVE NEGATIVE Final    Comment: (NOTE) SARS-CoV-2 target nucleic acids are NOT DETECTED. The SARS-CoV-2 RNA is generally detectable in upper and lower respiratory specimens during the acute phase of infection. Negative results do not preclude SARS-CoV-2 infection, do not rule out co-infections with other pathogens, and should not be used as the sole basis for treatment or other patient management decisions. Negative results must be combined with clinical observations, patient history, and epidemiological information. The expected result is Negative. Fact Sheet for Patients: TrashEliminator.se Fact Sheet for Healthcare Providers: WhoisBlogging.ch This test is not yet approved or cleared by the [place] FDA and  has been authorized for detection and/or diagnosis of SARS-CoV-2 by FDA under an Emergency Use Authorization (EUA). This EUA will remain  in effect (meaning this test can be used) for the duration of the COVID-19 declaration under Section 56 4(b)(1) of the Act, 21 U.S.C. section 360bbb-3(b)(1), unless the authorization is terminated or revoked sooner. Performed at [facility], [place] [address] [place], [place] [address]     Imaging: CT - 42mm right mid ureteral stone with small non-obstructing stones in the right kidney.  Imp: I spoke with the patient about his CT scan which demonstrated a 6 mm right mid ureteral stone with some nonobstructing stones in the right kidney, all be them very small. We discussed the treatment options. The density of the  stone suggest uric acid. As such, recommended ureteroscopy. The patient was agreeable. We discussed the surgery including the risks and the benefits. He understands that following the surgery he will also have stent.  He agreed to proceed.

## 2019-05-15 NOTE — Anesthesia Postprocedure Evaluation (Signed)
Anesthesia Post Note  Patient: CHIBUIKE FLEEK  Procedure(s) Performed: CYSTOSCOPY WITH URETEROSCOPY, LASER LITHOTRIPSY STONE BASKETRY AND STENT PLACEMENT, RIGHT RETROGRADE (Right Ureter)     Patient location during evaluation: PACU Anesthesia Type: General Level of consciousness: awake and alert Pain management: pain level controlled Vital Signs Assessment: post-procedure vital signs reviewed and stable Respiratory status: spontaneous breathing, nonlabored ventilation, respiratory function stable and patient connected to nasal cannula oxygen Cardiovascular status: blood pressure returned to baseline and stable Postop Assessment: no apparent nausea or vomiting Anesthetic complications: no    Last Vitals:  Vitals:   05/15/19 1100 05/15/19 1150  BP: 139/73 (!) 159/72  Pulse: 66 62  Resp: 14 16  Temp:  36.6 C  SpO2: 97% 99%    Last Pain:  Vitals:   05/15/19 1150  TempSrc:   PainSc: 3                  Montez Hageman

## 2019-05-15 NOTE — Op Note (Signed)
Preoperative diagnosis: right ureteral calculus  Postoperative diagnosis: right ureteral calculus  Procedure:  1. Cystoscopy 2. right ureteroscopy and stone removal 3. Ureteroscopic laser lithotripsy 4. right 79F x 26 ureteral stent placement  5. right retrograde pyelography with interpretation  Surgeon: Ardis Hughs, MD  Anesthesia: General  Complications: None  Intraoperative findings: right retrograde pyelography demonstrated a filling defect within the right ureter consistent with the patient's known calculus without other abnormalities.  In addition, pyeloscopy demonstrated some stones in the left lower pole that were behind an area of re-epithelialized calyces.  I was able to use the laser to open up this area and then fragmented the stones.  I irrigated the stones up into the renal pelvis and remove the larger ones.  EBL: Minimal  Specimens: 1. right ureteral calculus  Disposition of specimens: Given to the patient  Indication: Matthew Schmitt is a 68 y.o.   patient who presented with low back pain to his orthopedic surgeon.  An MRI demonstrated right-sided hydroureteronephrosis.  A subsequent CT scan demonstrated an obstructing stone in the right mid ureter.  We spoke over the phone about this, and opted to proceed with ureteroscopy and stone removal.   After reviewing the management options for treatment, the patient elected to proceed with the above surgical procedure(s). We have discussed the potential benefits and risks of the procedure, side effects of the proposed treatment, the likelihood of the patient achieving the goals of the procedure, and any potential problems that might occur during the procedure or recuperation. Informed consent has been obtained.   Description of procedure:  The patient was taken to the operating room and general anesthesia was induced.  The patient was placed in the dorsal lithotomy position, prepped and draped in the usual sterile  fashion, and preoperative antibiotics were administered. A preoperative time-out was performed.   Cystourethroscopy was performed.  The patient's urethra was examined and was normal.  The bladder was then systematically examined in its entirety. There was no evidence for any bladder tumors, stones, or other mucosal pathology.    Attention then turned to the right ureteral orifice and a ureteral catheter was used to intubate the ureteral orifice.  Omnipaque contrast was injected through the ureteral catheter and a retrograde pyelogram was performed with findings as dictated above.  A 0.38 sensor guidewire was then advanced up the right ureter into the renal pelvis under fluoroscopic guidance. The 6 Fr semirigid ureteroscope was then advanced into the ureter next to the guidewire and the calculus was identified.   The stone was then fragmented with the 365 micron holmium laser fiber on a setting of 0.6 and frequency of 6 Hz.   All stones were then removed from the ureter with an N-gage nitinol basket.  Reinspection of the ureter revealed no remaining visible stones or fragments.   I then advanced the scope up to the renal pelvis noting no additional stones within the ureter.  I advanced a second wire through the scope and remove the scope over the wire.  I then advanced the flexible ureteroscope over the wire and into the renal pelvis under fluoroscopic guidance removing the wire.  I subsequently performed pyeloscopy and encountered some stones in the patient's lower pole.  The stones were attached to the renal parenchyma and they were medial in the angle made it very difficult to find.  I was able to laser these off the wall and ultimately irrigated them out of the lower pole and was able  to remove the larger pieces.  Some of the smaller stone fragments that were not amenable to basketing were left in the renal pelvis.  I subsequently removed the flexible scope under visual guidance noting no significant  ureteral trauma.  The wire was then backloaded through the cystoscope and a ureteral stent was advance over the wire using Seldinger technique.  The stent was positioned appropriately under fluoroscopic and cystoscopic guidance.  The wire was then removed with an adequate stent curl noted in the renal pelvis as well as in the bladder.  The bladder was then emptied and the procedure ended.  The patient appeared to tolerate the procedure well and without complications.  The patient was able to be awakened and transferred to the recovery unit in satisfactory condition.   Disposition: The tether of the stent was left on and secured to the ventral aspect of the patient's penis. tucked inside the patient's vagina.  Instructions for removing the stent have been provided to the patient. The patient has been scheduled for followup in 6 weeks with a renal ultrasound.

## 2019-05-15 NOTE — Anesthesia Procedure Notes (Signed)
Procedure Name: LMA Insertion Date/Time: 05/15/2019 8:55 AM Performed by: Wanita Chamberlain, CRNA Pre-anesthesia Checklist: Patient identified, Emergency Drugs available, Suction available, Patient being monitored and Timeout performed Patient Re-evaluated:Patient Re-evaluated prior to induction Oxygen Delivery Method: Circle system utilized Preoxygenation: Pre-oxygenation with 100% oxygen Induction Type: IV induction Ventilation: Mask ventilation without difficulty LMA: LMA inserted LMA Size: 5.0 Number of attempts: 1 Placement Confirmation: breath sounds checked- equal and bilateral,  CO2 detector and positive ETCO2 Tube secured with: Tape Dental Injury: Teeth and Oropharynx as per pre-operative assessment

## 2019-05-15 NOTE — Discharge Instructions (Signed)
DISCHARGE INSTRUCTIONS FOR KIDNEY STONE/URETERAL STENT   MEDICATIONS:  1.  Resume all your other meds from home - except do not take any extra narcotic pain meds that you may have at home.  2. Pyridium is to help with the burning/stinging when you urinate. 3. Tramadol is for moderate/severe pain, otherwise taking upto 1000 mg every 6 hours of plainTylenol will help treat your pain.   4. Take Cipro one hour prior to removal of your stent.   ACTIVITY:  1. No strenuous activity x 1week  2. No driving while on narcotic pain medications  3. Drink plenty of water  4. Continue to walk at home - you can still get blood clots when you are at home, so keep active, but don't over do it.  5. May return to work/school tomorrow or when you feel ready   BATHING:  1. You can shower and we recommend daily showers  2. You have a string coming from your urethra: The stent string is attached to your ureteral stent. Do not pull on this.   SIGNS/SYMPTOMS TO CALL:  Please call us if you have a fever greater than 101.5, uncontrolled nausea/vomiting, uncontrolled pain, dizziness, unable to urinate, bloody urine, chest pain, shortness of breath, leg swelling, leg pain, redness around wound, drainage from wound, or any other concerns or questions.   You can reach [place] at [phone].   FOLLOW-UP:  1. You have an appointment in 6 weeks with a ultrasound of your kidneys prior.   2. You have a string attached to your stent, you may remove it on June 30th. To do this, pull the strings until the stents are completely removed. You may feel an odd sensation in your back.   CYSTOSCOPY HOME CARE INSTRUCTIONS  Activity: Rest for the remainder of the day.  Do not drive or operate equipment today.  You may resume normal activities in one to two days as instructed by your physician.   Meals: Drink plenty of liquids and eat light foods such as gelatin or soup this evening.  You may return to a normal meal plan  tomorrow.  Return to Work: You may return to work in one to two days or as instructed by your physician.  Special Instructions / Symptoms: Call your physician if any of these symptoms occur:   -persistent or heavy bleeding  -bleeding which continues after first few urination  -large blood clots that are difficult to pass  -urine stream diminishes or stops completely  -fever equal to or higher than 101 degrees Farenheit.  -cloudy urine with a strong, foul odor  -severe pain  Females should always wipe from front to back after elimination.  You may feel some burning pain when you urinate.  This should disappear with time.  Applying moist heat to the lower abdomen or a hot tub bath may help relieve the pain.    Post Anesthesia Home Care Instructions  Activity: Get plenty of rest for the remainder of the day. A responsible individual must stay with you for 24 hours following the procedure.  For the next 24 hours, DO NOT: -Drive a car -Paediatric nurse -Drink alcoholic beverages -Take any medication unless instructed by your physician -Make any legal decisions or sign important papers.  Meals: Start with liquid foods such as gelatin or soup. Progress to regular foods as tolerated. Avoid greasy, spicy, heavy foods. If nausea and/or vomiting occur, drink only clear liquids until the nausea and/or vomiting subsides. Call your physician if  vomiting continues.  Special Instructions/Symptoms: Your throat may feel dry or sore from the anesthesia or the breathing tube placed in your throat during surgery. If this causes discomfort, gargle with warm salt water. The discomfort should disappear within 24 hours.

## 2019-05-15 NOTE — Interval H&P Note (Signed)
History and Physical Interval Note:  05/15/2019 8:46 AM  Matthew Schmitt  has presented today for surgery, with the diagnosis of RIGHT URETERAL STONE.  The various methods of treatment have been discussed with the patient and family. After consideration of risks, benefits and other options for treatment, the patient has consented to  Procedure(s): CYSTOSCOPY WITH URETEROSCOPY, LASER [place] (Right) as a surgical intervention.  The patient's history has been reviewed, patient examined, no change in status, stable for surgery.  I have reviewed the patient's chart and labs.  Questions were answered to the patient's satisfaction.     Ardis Hughs

## 2019-05-15 NOTE — Transfer of Care (Signed)
Immediate Anesthesia Transfer of Care Note  Patient: Matthew Schmitt  Procedure(s) Performed: CYSTOSCOPY WITH URETEROSCOPY, LASER LITHOTRIPSY STONE BASKETRY AND STENT PLACEMENT, RIGHT RETROGRADE (Right Ureter)  Patient Location: PACU  Anesthesia Type:General  Level of Consciousness: sedated and patient cooperative  Airway & Oxygen Therapy: Patient Spontanous Breathing and Patient connected to nasal cannula oxygen  Post-op Assessment: Report given to RN and Post -op Vital signs reviewed and stable  Post vital signs: Reviewed and stable  Last Vitals:  Vitals Value Taken Time  BP    Temp    Pulse 70 05/15/19 1023  Resp    SpO2 99 % 05/15/19 1023  Vitals shown include unvalidated device data.  Last Pain:  Vitals:   05/15/19 0709  TempSrc: Oral  PainSc: 0-No pain      Patients Stated Pain Goal: 9 (43/88/[id number])  Complications: No apparent anesthesia complications

## 2019-05-18 ENCOUNTER — Encounter (HOSPITAL_BASED_OUTPATIENT_CLINIC_OR_DEPARTMENT_OTHER): Payer: Self-pay | Admitting: Urology

## 2019-05-19 ENCOUNTER — Ambulatory Visit: Payer: BLUE CROSS/BLUE SHIELD | Admitting: Endocrinology

## 2019-06-03 ENCOUNTER — Other Ambulatory Visit: Payer: Self-pay

## 2019-06-05 ENCOUNTER — Encounter: Payer: Self-pay | Admitting: Endocrinology

## 2019-06-05 ENCOUNTER — Other Ambulatory Visit: Payer: Self-pay

## 2019-06-05 ENCOUNTER — Ambulatory Visit: Payer: BC Managed Care – PPO | Admitting: Endocrinology

## 2019-06-05 VITALS — BP 164/82 | HR 68 | Ht 71.0 in | Wt 232.0 lb

## 2019-06-05 DIAGNOSIS — Z794 Long term (current) use of insulin: Secondary | ICD-10-CM | POA: Diagnosis not present

## 2019-06-05 DIAGNOSIS — E1159 Type 2 diabetes mellitus with other circulatory complications: Secondary | ICD-10-CM | POA: Diagnosis not present

## 2019-06-05 LAB — POCT GLYCOSYLATED HEMOGLOBIN (HGB A1C): Hemoglobin A1C: 6.3 % — AB (ref 4.0–5.6)

## 2019-06-05 MED ORDER — GLUCOSE BLOOD VI STRP: 1.0000 | ORAL_STRIP | Freq: Two times a day (BID) | 3 refills | Status: DC

## 2019-06-05 MED ORDER — BASAGLAR KWIKPEN 100 UNIT/ML ~~LOC~~ SOPN: 70.0000 [IU] | PEN_INJECTOR | SUBCUTANEOUS | 3 refills | Status: DC

## 2019-06-05 NOTE — Patient Instructions (Addendum)
Your blood pressure is high today.  Please see your primary care provider soon, to have it rechecked check your blood sugar once a day.  vary the time of day when you check, between before the 3 meals, and at bedtime.  also check if you have symptoms of your blood sugar being too high or too low.  please keep a record of the readings and bring it to your next appointment here.  please call us sooner if you are having low blood sugar episodes, or if it is running above 200.  Please reduce the insulin to 70 units each morning.   On this type of insulin schedule, you should eat meals on a regular schedule.  If a meal is missed or significantly delayed, your blood sugar could go low.   Please continue the same other diabetes medications.   Please come back for a follow-up appointment in 4 months.

## 2019-06-05 NOTE — Progress Notes (Signed)
Subjective:    Patient ID: Matthew Schmitt, male    DOB: 12/12/1950, 68 y.o.   MRN: [id number]  HPI Pt returns for f/u of diabetes mellitus: DM type: Insulin-requiring type 2. Dx'ed: 2002.   Complications: nephropathy, leg ulcers, and CAD.  Therapy: insulin since 2015, Farxiga, and Trulicity.   DKA: never.   Severe hypoglycemia: once, in 2017.  Pancreatitis: never.   Other: he declines multiple daily injections; he has severe insulin resistance.  In early 2017, he requested to change levemir to lantus, due to high dosage requirement.   Interval history: He brings a record of his cbg's which I have reviewed today.  cbg varies from 80-180.  No recent steroids.  He takes 150 units qam.  However, he skips if fasting cbg is below 150 (which happens approx twice a week).   Past Medical History:  Diagnosis Date  . Anxiety   . Arthritis    everywhere  . BPH associated with nocturia   . Coronary artery disease    cardiologist-  dr Bettina Gavia (previouly dr Wynonia Lawman)  . Essential hypertension, benign   . First degree heart block   . Heart murmur   . History of bladder stone   . History of kidney stones   . History of melanoma excision    June 2016  --- s/p  excision right  leg (per pt localized and no recurrence)  . History of renal cell carcinoma 01/2017   s/p  left partial nephrecotmy  . Impotence of organic origin   . Mixed hyperlipidemia    slightly  . Multiple renal cysts    bilateral  . PAF (paroxysmal atrial fibrillation) ([place])    followed by cardiology  (takes ASA)  . RBBB   . Right ureteral stone   . Type 2 diabetes mellitus treated with insulin [facility])    endocrinologist-  dr Loanne Drilling  . Wears glasses     Past Surgical History:  Procedure Laterality Date  . CARDIAC CATHETERIZATION  08-17-2011  DR SPENCER TILLEY   POSSIBLE MODERATE LAD STENOSIS JUST AFTER THE BIFURCATION OF LARGE DIAGONAL BRANCH/ LVEF  40-45%/ MILD GLOBAL HYPODINESIS (CATH DONE FOR ABNORMAL STRESS TEST OF  FIXED LATERAL WALL DEFECT & BIFASCICULAR BLAOCK)  . CARDIOVERSION N/A 04/02/2013   Procedure: CARDIOVERSION;  Surgeon: Jacolyn Reedy, MD;  Location: [place];  Service: Cardiovascular;  Laterality: N/A;  . CYSTOSCOPY WITH LITHOLAPAXY N/A 05/18/2015   Procedure: CYSTOSCOPY WITH LITHOLAPAXY;  Surgeon: Rana Snare, MD;  Location: [facility];  Service: Urology;  Laterality: N/A;  . CYSTOSCOPY WITH LITHOLAPAXY N/A 01/04/2017   Procedure: CYSTOSCOPY WITH LITHOLAPAXY, Holmium Laser;  Surgeon: Ardis Hughs, MD;  [facility];  Service: Urology;  Laterality: N/A;  . CYSTOSCOPY WITH RETROGRADE PYELOGRAM, URETEROSCOPY AND STENT PLACEMENT  11/03/2012   Procedure: CYSTOSCOPY WITH RETROGRADE PYELOGRAM, URETEROSCOPY AND STENT PLACEMENT;  Surgeon: Bernestine Amass, MD;  Location: [facility];  Service: Urology;  Laterality: Left;  Cystoscopy/Left Retrograde/Ureteroscopy/Holmium Laser Litho/Double J Stent  . CYSTOSCOPY WITH URETEROSCOPY, STONE BASKETRY AND STENT PLACEMENT Right 05/15/2019   Procedure: CYSTOSCOPY WITH URETEROSCOPY, LASER LITHOTRIPSY STONE BASKETRY AND STENT PLACEMENT, RIGHT RETROGRADE;  Surgeon: Ardis Hughs, MD;  Location: [facility];  Service: Urology;  Laterality: Right;  . HOLMIUM LASER APPLICATION  74/25/9563   Procedure: HOLMIUM LASER APPLICATION;  Surgeon: Bernestine Amass, MD;  Location: [facility];  Service: Urology;  Laterality: Left;  . HOLMIUM LASER APPLICATION N/A  05/18/2015   Procedure: HOLMIUM LASER APPLICATION;  Surgeon: Rana Snare, MD;  Location: [facility];  Service: Urology;  Laterality: N/A;  . KNEE SURGERY Bilateral [place]  . MELANOMA EXCISION Bilateral June 2016   20th-- left leg //  1st-- right leg w/ skin graft from right thigh (no lymph node bx)  . NEPHROLITHOTOMY Right 05/17/2014   Procedure: NEPHROLITHOTOMY PERCUTANEOUS;  Surgeon: Bernestine Amass, MD;  [facility];   Service: Urology;  Laterality: Right;  . PILONIDAL CYST EXCISION    . ROBOTIC ASSITED PARTIAL NEPHRECTOMY Left 06/18/2013   Procedure: ROBOTIC ASSISTED PARTIAL NEPHRECTOMY;  Surgeon: Dutch Gray, MD;  [facility];  Service: Urology;  Laterality: Left;  . ROTATOR CUFF REPAIR Left 07/2016  . TONSILLECTOMY  44  (age 36)  . TOTAL KNEE ARTHROPLASTY Left 02/07/2017   Procedure: LEFT TOTAL KNEE ARTHROPLASTY;  Surgeon: Leandrew Koyanagi, MD;  Location: [place];  Service: Orthopedics;  Laterality: Left;  . TOTAL KNEE ARTHROPLASTY Right 04/25/2017   Procedure: RIGHT TOTAL KNEE ARTHROPLASTY;  Surgeon: Leandrew Koyanagi, MD;  Location: [place];  Service: Orthopedics;  Laterality: Right;  . TRANSURETHRAL RESECTION OF PROSTATE N/A 01/04/2017   Procedure: TRANSURETHRAL RESECTION OF THE PROSTATE (TURP);  Surgeon: Ardis Hughs, MD;  [facility];  Service: Urology;  Laterality: N/A;    Social History   Socioeconomic History  . Marital status: Married    Spouse name: Not on file  . Number of children: 2  . Years of education: 41  . Highest education level: Not on file  Occupational History  . Occupation: Government social research officer, Psychologist, occupational  Social Needs  . Financial resource strain: Not on file  . Food insecurity    Worry: Not on file    Inability: Not on file  . Transportation needs    Medical: Not on file    Non-medical: Not on file  Tobacco Use  . Smoking status: Former Smoker    Packs/day: 1.00    Years: 6.00    Pack years: 6.00    Types: Cigarettes    Quit date: 10/31/1969    Years since quitting: 49.6  . Smokeless tobacco: Never Used  Substance and Sexual Activity  . Alcohol use: Yes    Alcohol/week: 0.0 standard drinks    Comment: OCCASIONAL  . Drug use: No  . Sexual activity: Never  Lifestyle  . Physical activity    Days per week: Not on file    Minutes per session: Not on file  . Stress: Not on file  Relationships  . Social Herbalist on phone: Not on file    Gets  together: Not on file    Attends religious service: Not on file    Active member of club or organization: Not on file    Attends meetings of clubs or organizations: Not on file    Relationship status: Not on file  . Intimate partner violence    Fear of current or ex partner: Not on file    Emotionally abused: Not on file    Physically abused: Not on file    Forced sexual activity: Not on file  Other Topics Concern  . Not on file  Social History Narrative   Regular exercise-no    Current Outpatient Medications on File Prior to Visit  Medication Sig Dispense Refill  . amLODipine (NORVASC) 10 MG tablet Take 1 tablet (10 mg total) by mouth daily. (Patient  taking differently: Take 10 mg by mouth daily. ) 90 tablet 0  . aspirin EC 81 MG tablet Take 81 mg by mouth daily.    . dapagliflozin propanediol (FARXIGA) 10 MG TABS tablet Take 10 mg by mouth daily. (Patient taking differently: Take 10 mg by mouth daily. ) 90 tablet 3  . Menthol, Topical Analgesic, (BENGAY EX) Apply 1 application topically daily as needed (pain).    . metoprolol tartrate (LOPRESSOR) 100 MG tablet Take 100 mg by mouth daily at 12 noon.    . phenazopyridine (PYRIDIUM) 200 MG tablet Take 1 tablet (200 mg total) by mouth 3 (three) times daily as needed for pain. 10 tablet 0  . sildenafil (VIAGRA) 100 MG tablet Take 100 mg by mouth daily as needed.  2  . traMADol (ULTRAM) 50 MG tablet Take 1-2 tablets (50-100 mg total) by mouth every 6 (six) hours as needed for moderate pain. 15 tablet 0  . TRULICITY 1.5 WC/3.7SE SOPN INJECT 1.5 MG UNDER THE SKIN ONCE A WEEK (Patient taking differently: No sig reported) 6 mL 3  . valsartan-hydrochlorothiazide (DIOVAN-HCT) 320-25 MG per tablet Take 1 tablet by mouth daily.      No current facility-administered medications on file prior to visit.     Allergies  Allergen Reactions  . Actos [Pioglitazone] Swelling    SWELLING REACTION UNSPECIFIED  EDEMA    Family History  Problem  Relation Age of Onset  . Arthritis Father   . Hypertension Father   . Diabetes Father   . Arthritis Mother   . Hypertension Mother   . Heart attack Brother 53  . Diabetes Brother   . Lymphoma Paternal Grandfather   . Cancer Maternal Uncle   . Cancer Paternal Uncle        type unknown    BP (!) 164/82 (BP Location: Left Arm, Patient Position: Sitting, Cuff Size: Normal)   Pulse 68   Ht 5\' 11"  (1.803 m)   Wt 232 lb (105.2 kg)   SpO2 98%   BMI 32.36 kg/m    Review of Systems He denies hypoglycemia.  He has lost more weight, due to his efforts.      Objective:   Physical Exam VITAL SIGNS:  See vs page GENERAL: no distress Pulses: dorsalis pedis intact bilat.   MSK: no deformity of the feet CV: trace bilat leg edema Skin:  no ulcer on the feet, but there is a shallow ulcer at the left calf.  normal color and temp on the feet, but there is hyperpigmentation of the legs.   Neuro: sensation is intact to touch on the feet Ext: there is bilateral onychomycosis of the toenails.  Both great toenails are absent.    Lab Results  Component Value Date   HGBA1C 6.3 (A) 06/05/2019       Assessment & Plan:  HTN: is noted today Insulin-requiring type 2 DM, with CAD: overcontrolled Weight loss: we discussed. Pt is advised to continue his efforts.  Patient Instructions  Your blood pressure is high today.  Please see your primary care provider soon, to have it rechecked check your blood sugar once a day.  vary the time of day when you check, between before the 3 meals, and at bedtime.  also check if you have symptoms of your blood sugar being too high or too low.  please keep a record of the readings and bring it to your next appointment here.  please call us sooner if you are having low  blood sugar episodes, or if it is running above 200.  Please reduce the insulin to 70 units each morning.   On this type of insulin schedule, you should eat meals on a regular schedule.  If a meal is  missed or significantly delayed, your blood sugar could go low.   Please continue the same other diabetes medications.   Please come back for a follow-up appointment in 4 months.

## 2019-06-16 DIAGNOSIS — Z139 Encounter for screening, unspecified: Secondary | ICD-10-CM | POA: Diagnosis not present

## 2019-06-16 DIAGNOSIS — Z0000 Encounter for general adult medical examination without abnormal findings: Secondary | ICD-10-CM | POA: Diagnosis not present

## 2019-06-16 DIAGNOSIS — E1165 Type 2 diabetes mellitus with hyperglycemia: Secondary | ICD-10-CM | POA: Diagnosis not present

## 2019-06-16 DIAGNOSIS — Z1331 Encounter for screening for depression: Secondary | ICD-10-CM | POA: Diagnosis not present

## 2019-06-16 DIAGNOSIS — Z9181 History of falling: Secondary | ICD-10-CM | POA: Diagnosis not present

## 2019-06-25 ENCOUNTER — Ambulatory Visit: Payer: BC Managed Care – PPO

## 2019-06-25 ENCOUNTER — Other Ambulatory Visit: Payer: Self-pay

## 2019-06-25 ENCOUNTER — Ambulatory Visit: Payer: Self-pay

## 2019-06-25 ENCOUNTER — Ambulatory Visit (INDEPENDENT_AMBULATORY_CARE_PROVIDER_SITE_OTHER): Payer: BC Managed Care – PPO | Admitting: Orthopaedic Surgery

## 2019-06-25 DIAGNOSIS — Z96652 Presence of left artificial knee joint: Secondary | ICD-10-CM

## 2019-06-25 DIAGNOSIS — G8929 Other chronic pain: Secondary | ICD-10-CM | POA: Diagnosis not present

## 2019-06-25 DIAGNOSIS — M25511 Pain in right shoulder: Secondary | ICD-10-CM | POA: Diagnosis not present

## 2019-06-25 DIAGNOSIS — N202 Calculus of kidney with calculus of ureter: Secondary | ICD-10-CM | POA: Diagnosis not present

## 2019-06-25 MED ORDER — BUPIVACAINE HCL 0.5 % IJ SOLN
3.0000 mL | INTRAMUSCULAR | Status: AC | PRN
  Administered 2019-06-25: 3 mL via INTRA_ARTICULAR

## 2019-06-25 MED ORDER — METHYLPREDNISOLONE ACETATE 40 MG/ML IJ SUSP
40.0000 mg | INTRAMUSCULAR | Status: AC | PRN
  Administered 2019-06-25: 40 mg via INTRA_ARTICULAR

## 2019-06-25 MED ORDER — LIDOCAINE HCL 1 % IJ SOLN
3.0000 mL | INTRAMUSCULAR | Status: AC | PRN
  Administered 2019-06-25: 3 mL

## 2019-06-25 NOTE — Progress Notes (Signed)
Office Visit Note   Patient: Matthew Schmitt           Date of Birth: 01/13/1951           MRN: [id number] Visit Date: 06/25/2019              Requested by: Isaias Sakai, [place],  [place] [address] PCP: Isaias Sakai, DO   Assessment & Plan: Visit Diagnoses:  1. Status post total left knee replacement   2. Chronic right shoulder pain     Plan: For the left knee I did compare today's x-rays to last year's x-rays it shows that there is evidence that the tibial tray may be loose.  The tibial tray appears to have fallen into a touch of varus.  Clinically speaking he does not seem to be having issues with this so it could just be micromotion.  His symptoms are more consistent with some relative quadriceps weakness and quadriceps tendinitis.  I would like to have him restart knee strengthening exercises.  I would like to keep a close eye on his left knee replacement therefore like to see him back in 6 months with repeat 2 view x-rays of the left knee.  For the right shoulder he likely has biceps tendinosis and rotator cuff tendinosis but overall the function is well-preserved and the pain is not excruciating.  We will try subacromial injection as well as home exercises today.  Questions encouraged and answered.  Follow-Up Instructions: Return in about 6 months (around 12/26/2019).   Orders:  Orders Placed This Encounter  Procedures  . Large Joint Inj: R subacromial bursa  . XR KNEE 3 VIEW LEFT  . XR Shoulder Right   No orders of the defined types were placed in this encounter.     Procedures: Large Joint Inj: R subacromial bursa on 06/25/2019 11:30 AM Indications: pain Details: 22 G needle  Arthrogram: No  Medications: 3 mL lidocaine 1 %; 3 mL bupivacaine 0.5 %; 40 mg methylPREDNISolone acetate 40 MG/ML Outcome: tolerated well, no immediate complications Consent was given by the patient. Patient was prepped and draped in the usual sterile  fashion.       Clinical Data: No additional findings.   Subjective: Chief Complaint  Patient presents with  . Left Knee - Pain  . Right Shoulder - Pain    Matthew Schmitt is a very pleasant 68 year old gentleman who comes in today for evaluation of left knee pain and right shoulder pain.  He is 2 years status post left total knee replacement from which he recovered very well.  He states that he has recently started developing some suprapatellar left knee pain for the last month.  He has stable chronic joint effusions in both knees.  He denies any injuries.  He states that the pain is worse with transitioning from sit to stand.  He has lost 40 pounds recently.  He denies any pain in the proximal tibia and denies any start up pain.  For the right shoulder he states that the pain is reminiscent of his previous left shoulder pain.  It is causing him discomfort to raise his arm above the level of the shoulder but he is certainly able to do so.  He feels a little bit of stiffness can weakness as a result of the pain.  Denies any numbness and tingling or neck pain.   Review of Systems  Constitutional: Negative.   All other systems reviewed and are negative.  Objective: Vital Signs: There were no vitals taken for this visit.  Physical Exam Vitals signs and nursing note reviewed.  Constitutional:      Appearance: He is well-developed.  HENT:     Head: Normocephalic and atraumatic.  Eyes:     Pupils: Pupils are equal, round, and reactive to light.  Neck:     Musculoskeletal: Neck supple.  Pulmonary:     Effort: Pulmonary effort is normal.  Abdominal:     Palpations: Abdomen is soft.  Musculoskeletal: Normal range of motion.  Skin:    General: Skin is warm.  Neurological:     Mental Status: He is alert and oriented to person, place, and time.  Psychiatric:        Behavior: Behavior normal.        Thought Content: Thought content normal.        Judgment: Judgment normal.     Ortho  Exam Left knee exam shows a mild joint effusion.  Collaterals and cruciates are stable.  No signs of infection.  No cellulitis.  He does not have tenderness around the proximal tibia.  He has increased discomfort with resisted knee extension and straight leg that localizes to the top of the patella.  Right shoulder exam shows good rotator cuff function and strength with mild pain.  Mildly positive impingement.  Negative cross adduction.  Positive Speed test. Specialty Comments:  No specialty comments available.  Imaging: Xr Knee 3 View Left  Result Date: 06/25/2019 [place] around the tibial stem concerning for loosening.  Slight varus alignment of the tibial tray.  Xr Shoulder Right  Result Date: 06/25/2019 [facility] joint arthrosis.  Mild glenohumeral arthrosis.  No acute abnormalities.    PMFS History: Patient Active Problem List   Diagnosis Date Noted  . Left-sided low back pain with left-sided sciatica 01/23/2019  . Diabetes ([place]) 01/18/2018  . History of bilateral knee replacement 02/07/2017  . Primary osteoarthritis of left knee   . BPH with obstruction/lower urinary tract symptoms 01/04/2017  . Rotator cuff syndrome of left shoulder 04/17/2016  . Nephrolithiasis 05/17/2014  . Encounter for long-term (current) use of other medications 07/27/2013  . Long term current use of anticoagulant therapy   . CAD (coronary artery disease) 04/02/2013  . Hypertensive heart disease 04/02/2013  . 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 Atrial flutter ([place])   . Nonischemic cardiomyopathy ([place])   . Obesity (BMI 30-39.9)   . History of kidney stones   . Osteoarthritis   . Hyperlipidemia    Past Medical History:  Diagnosis Date  . Anxiety   . Arthritis    everywhere  . BPH associated with nocturia   . Coronary artery disease    cardiologist-  dr Bettina Gavia (previouly dr Wynonia Lawman)  . Essential hypertension, benign   . First degree heart block   . Heart  murmur   . History of bladder stone   . History of kidney stones   . History of melanoma excision    June 2016  --- s/p  excision right  leg (per pt localized and no recurrence)  . History of renal cell carcinoma 01/2017   s/p  left partial nephrecotmy  . Impotence of organic origin   . Mixed hyperlipidemia    slightly  . Multiple renal cysts    bilateral  . PAF (paroxysmal atrial fibrillation) ([place])    followed by cardiology  (takes ASA)  . RBBB   . Right ureteral  stone   . Type 2 diabetes mellitus treated with insulin [facility])    endocrinologist-  dr Loanne Drilling  . Wears glasses     Family History  Problem Relation Age of Onset  . Arthritis Father   . Hypertension Father   . Diabetes Father   . Arthritis Mother   . Hypertension Mother   . Heart attack Brother 48  . Diabetes Brother   . Lymphoma Paternal Grandfather   . Cancer Maternal Uncle   . Cancer Paternal Uncle        type unknown    Past Surgical History:  Procedure Laterality Date  . CARDIAC CATHETERIZATION  08-17-2011  DR SPENCER TILLEY   POSSIBLE MODERATE LAD STENOSIS JUST AFTER THE BIFURCATION OF LARGE DIAGONAL BRANCH/ LVEF  40-45%/ MILD GLOBAL HYPODINESIS (CATH DONE FOR ABNORMAL STRESS TEST OF FIXED LATERAL WALL DEFECT & BIFASCICULAR BLAOCK)  . CARDIOVERSION N/A 04/02/2013   Procedure: CARDIOVERSION;  Surgeon: Jacolyn Reedy, MD;  Location: [place];  Service: Cardiovascular;  Laterality: N/A;  . CYSTOSCOPY WITH LITHOLAPAXY N/A 05/18/2015   Procedure: CYSTOSCOPY WITH LITHOLAPAXY;  Surgeon: Rana Snare, MD;  Location[facility];  Service: Urology;  Laterality: N/A;  . CYSTOSCOPY WITH LITHOLAPAXY N/A 01/04/2017   Procedure: CYSTOSCOPY WITH LITHOLAPAXY, Holmium Laser;  Surgeon: Ardis Hughs, MD;  [facility];  Service: Urology;  Laterality: N/A;  . CYSTOSCOPY WITH RETROGRADE PYELOGRAM, URETEROSCOPY AND STENT PLACEMENT  11/03/2012   Procedure: CYSTOSCOPY WITH RETROGRADE PYELOGRAM, URETEROSCOPY  AND STENT PLACEMENT;  Surgeon: Bernestine Amass, MD;  Location[facility];  Service: Urology;  Laterality: Left;  Cystoscopy/Left Retrograde/Ureteroscopy/Holmium Laser Litho/Double J Stent  . CYSTOSCOPY WITH URETEROSCOPY, STONE BASKETRY AND STENT PLACEMENT Right 05/15/2019   Procedure: CYSTOSCOPY WITH URETEROSCOPY, LASER LITHOTRIPSY STONE BASKETRY AND STENT PLACEMENT, RIGHT RETROGRADE;  Surgeon: Ardis Hughs, MD;  Location: [facility];  Service: Urology;  Laterality: Right;  . HOLMIUM LASER APPLICATION  05/12/7627   Procedure: HOLMIUM LASER APPLICATION;  Surgeon: Bernestine Amass, MD;  Location: [facility];  Service: Urology;  Laterality: Left;  . HOLMIUM LASER APPLICATION N/A 02/01/1760   Procedure: HOLMIUM LASER APPLICATION;  Surgeon: Rana Snare, MD;  Location: [facility];  Service: Urology;  Laterality: N/A;  . KNEE SURGERY Bilateral [place]  . MELANOMA EXCISION Bilateral June 2016   20th-- left leg //  1st-- right leg w/ skin graft from right thigh (no lymph node bx)  . NEPHROLITHOTOMY Right 05/17/2014   Procedure: NEPHROLITHOTOMY PERCUTANEOUS;  Surgeon: Bernestine Amass, MD;  [facility];  Service: Urology;  Laterality: Right;  . PILONIDAL CYST EXCISION    . ROBOTIC ASSITED PARTIAL NEPHRECTOMY Left 06/18/2013   Procedure: ROBOTIC ASSISTED PARTIAL NEPHRECTOMY;  Surgeon: Dutch Gray, MD;  [facility];  Service: Urology;  Laterality: Left;  . ROTATOR CUFF REPAIR Left 07/2016  . TONSILLECTOMY  65  (age 19)  . TOTAL KNEE ARTHROPLASTY Left 02/07/2017   Procedure: LEFT TOTAL KNEE ARTHROPLASTY;  Surgeon: Leandrew Koyanagi, MD;  Location: [place];  Service: Orthopedics;  Laterality: Left;  . TOTAL KNEE ARTHROPLASTY Right 04/25/2017   Procedure: RIGHT TOTAL KNEE ARTHROPLASTY;  Surgeon: Leandrew Koyanagi, MD;  Location: [place];  Service: Orthopedics;  Laterality: Right;  . TRANSURETHRAL RESECTION OF PROSTATE N/A 01/04/2017   Procedure:  TRANSURETHRAL RESECTION OF THE PROSTATE (TURP);  Surgeon: Ardis Hughs, MD;  [facility];  Service: Urology;  Laterality:  N/A;   Social History   Occupational History  . Occupation: Government social research officer, Psychologist, occupational  Tobacco Use  . Smoking status: Former Smoker    Packs/day: 1.00    Years: 6.00    Pack years: 6.00    Types: Cigarettes    Quit date: 10/31/1969    Years since quitting: 49.6  . Smokeless tobacco: Never Used  Substance and Sexual Activity  . Alcohol use: Yes    Alcohol/week: 0.0 standard drinks    Comment: OCCASIONAL  . Drug use: No  . Sexual activity: Never

## 2019-07-11 ENCOUNTER — Encounter: Payer: Self-pay | Admitting: Gastroenterology

## 2019-09-14 DIAGNOSIS — Z20828 Contact with and (suspected) exposure to other viral communicable diseases: Secondary | ICD-10-CM | POA: Diagnosis not present

## 2019-09-14 DIAGNOSIS — B349 Viral infection, unspecified: Secondary | ICD-10-CM | POA: Diagnosis not present

## 2019-10-06 ENCOUNTER — Other Ambulatory Visit: Payer: Self-pay

## 2019-10-08 ENCOUNTER — Encounter: Payer: Self-pay | Admitting: Endocrinology

## 2019-10-08 ENCOUNTER — Other Ambulatory Visit: Payer: Self-pay

## 2019-10-08 ENCOUNTER — Ambulatory Visit: Payer: BC Managed Care – PPO | Admitting: Endocrinology

## 2019-10-08 VITALS — BP 140/64 | HR 76 | Ht 71.0 in | Wt 234.4 lb

## 2019-10-08 DIAGNOSIS — E1159 Type 2 diabetes mellitus with other circulatory complications: Secondary | ICD-10-CM | POA: Diagnosis not present

## 2019-10-08 DIAGNOSIS — Z794 Long term (current) use of insulin: Secondary | ICD-10-CM

## 2019-10-08 LAB — POCT GLYCOSYLATED HEMOGLOBIN (HGB A1C): Hemoglobin A1C: 6.8 % — AB (ref 4.0–5.6)

## 2019-10-08 NOTE — Patient Instructions (Addendum)
Your blood pressure is high today.  Please see your primary care provider soon, to have it rechecked check your blood sugar once a day.  vary the time of day when you check, between before the 3 meals, and at bedtime.  also check if you have symptoms of your blood sugar being too high or too low.  please keep a record of the readings and bring it to your next appointment here.  please call us sooner if you are having low blood sugar episodes, or if it is running above 200.  A different type of diabetes blood test are requested for you today.  We'll let you know about the results.  On this type of insulin schedule, you should eat meals on a regular schedule.  If a meal is missed or significantly delayed, your blood sugar could go low.   Please come back for a follow-up appointment in 4 months.

## 2019-10-08 NOTE — Progress Notes (Signed)
Subjective:    Patient ID: Matthew Schmitt, male    DOB: [birth year], 68 y.o.   MRN: [id number]  HPI Pt returns for f/u of diabetes mellitus: DM type: Insulin-requiring type 2. Dx'ed: 2002.   Complications: renal insuff, leg ulcers, and CAD.  Therapy: insulin since 2015, Farxiga, and Trulicity.   DKA: never.   Severe hypoglycemia: once, in 2017.  Pancreatitis: never.   Other: he declines multiple daily injections;.  In early 2017, he requested to change levemir to lantus, due to high dosage requirement; fructosamine showed better glycemic control than A1c.     Interval history: He brings a record of his fasting cbg's which I have reviewed today.  cbg varies from 126-202.  No recent steroids.  He takes 75 units qam.   Past Medical History:  Diagnosis Date  . Anxiety   . Arthritis    everywhere  . BPH associated with nocturia   . Coronary artery disease    cardiologist-  dr Bettina Gavia (previouly dr Wynonia Lawman)  . Essential hypertension, benign   . First degree heart block   . Heart murmur   . History of bladder stone   . History of kidney stones   . History of melanoma excision    June 2016  --- s/p  excision right  leg (per pt localized and no recurrence)  . History of renal cell carcinoma 01/2017   s/p  left partial nephrecotmy  . Impotence of organic origin   . Mixed hyperlipidemia    slightly  . Multiple renal cysts    bilateral  . PAF (paroxysmal atrial fibrillation) ([place])    followed by cardiology  (takes ASA)  . RBBB   . Right ureteral stone   . Type 2 diabetes mellitus treated with insulin [facility])    endocrinologist-  dr Loanne Drilling  . Wears glasses     Past Surgical History:  Procedure Laterality Date  . CARDIAC CATHETERIZATION  08-17-2011  DR SPENCER TILLEY   POSSIBLE MODERATE LAD STENOSIS JUST AFTER THE BIFURCATION OF LARGE DIAGONAL BRANCH/ LVEF  40-45%/ MILD GLOBAL HYPODINESIS (CATH DONE FOR ABNORMAL STRESS TEST OF FIXED LATERAL WALL DEFECT & BIFASCICULAR BLAOCK)  .  CARDIOVERSION N/A 04/02/2013   Procedure: CARDIOVERSION;  Surgeon: Jacolyn Reedy, MD;  Location: [facility];  Service: Cardiovascular;  Laterality: N/A;  . CYSTOSCOPY WITH LITHOLAPAXY N/A 05/18/2015   Procedure: CYSTOSCOPY WITH LITHOLAPAXY;  Surgeon: Rana Snare, MD;  Location: [facility];  Service: Urology;  Laterality: N/A;  . CYSTOSCOPY WITH LITHOLAPAXY N/A 01/04/2017   Procedure: CYSTOSCOPY WITH LITHOLAPAXY, Holmium Laser;  Surgeon: Ardis Hughs, MD;  [facility];  Service: Urology;  Laterality: N/A;  . CYSTOSCOPY WITH RETROGRADE PYELOGRAM, URETEROSCOPY AND STENT PLACEMENT  11/03/2012   Procedure: CYSTOSCOPY WITH RETROGRADE PYELOGRAM, URETEROSCOPY AND STENT PLACEMENT;  Surgeon: Bernestine Amass, MD;  Location[facility];  Service: Urology;  Laterality: Left;  Cystoscopy/Left Retrograde/Ureteroscopy/Holmium Laser Litho/Double J Stent  . CYSTOSCOPY WITH URETEROSCOPY, STONE BASKETRY AND STENT PLACEMENT Right 05/15/2019   Procedure: CYSTOSCOPY WITH URETEROSCOPY, LASER LITHOTRIPSY STONE BASKETRY AND STENT PLACEMENT, RIGHT RETROGRADE;  Surgeon: Ardis Hughs, MD;  Location: [facility];  Service: Urology;  Laterality: Right;  . HOLMIUM LASER APPLICATION  [id number]   Procedure: HOLMIUM LASER APPLICATION;  Surgeon: Bernestine Amass, MD;  Location: [facility];  Service: Urology;  Laterality: Left;  . HOLMIUM LASER APPLICATION N/A 0000000   Procedure: HOLMIUM LASER APPLICATION;  Surgeon: Shanon Brow  Risa Grill, MD;  Location: [facility];  Service: Urology;  Laterality: N/A;  . KNEE SURGERY Bilateral [place]  . MELANOMA EXCISION Bilateral June 2016   20th-- left leg //  1st-- right leg w/ skin graft from right thigh (no lymph node bx)  . NEPHROLITHOTOMY Right 05/17/2014   Procedure: NEPHROLITHOTOMY PERCUTANEOUS;  Surgeon: Bernestine Amass, MD;  [facility];  Service: Urology;  Laterality: Right;  . PILONIDAL CYST  EXCISION    . ROBOTIC ASSITED PARTIAL NEPHRECTOMY Left 06/18/2013   Procedure: ROBOTIC ASSISTED PARTIAL NEPHRECTOMY;  Surgeon: Dutch Gray, MD;  [facility];  Service: Urology;  Laterality: Left;  . ROTATOR CUFF REPAIR Left 07/2016  . TONSILLECTOMY  49  (age 62)  . TOTAL KNEE ARTHROPLASTY Left 02/07/2017   Procedure: LEFT TOTAL KNEE ARTHROPLASTY;  Surgeon: Leandrew Koyanagi, MD;  Location: [place];  Service: Orthopedics;  Laterality: Left;  . TOTAL KNEE ARTHROPLASTY Right 04/25/2017   Procedure: RIGHT TOTAL KNEE ARTHROPLASTY;  Surgeon: Leandrew Koyanagi, MD;  Location: [place];  Service: Orthopedics;  Laterality: Right;  . TRANSURETHRAL RESECTION OF PROSTATE N/A 01/04/2017   Procedure: TRANSURETHRAL RESECTION OF THE PROSTATE (TURP);  Surgeon: Ardis Hughs, MD;  [facility];  Service: Urology;  Laterality: N/A;    Social History   Socioeconomic History  . Marital status: Married    Spouse name: Not on file  . Number of children: 2  . Years of education: 51  . Highest education level: Not on file  Occupational History  . Occupation: Government social research officer, Psychologist, occupational  Social Needs  . Financial resource strain: Not on file  . Food insecurity    Worry: Not on file    Inability: Not on file  . Transportation needs    Medical: Not on file    Non-medical: Not on file  Tobacco Use  . Smoking status: Former Smoker    Packs/day: 1.00    Years: 6.00    Pack years: 6.00    Types: Cigarettes    Quit date: 10/31/1969    Years since quitting: 49.9  . Smokeless tobacco: Never Used  Substance and Sexual Activity  . Alcohol use: Yes    Alcohol/week: 0.0 standard drinks    Comment: OCCASIONAL  . Drug use: No  . Sexual activity: Never  Lifestyle  . Physical activity    Days per week: Not on file    Minutes per session: Not on file  . Stress: Not on file  Relationships  . Social Herbalist on phone: Not on file    Gets together: Not on file    Attends religious service: Not  on file    Active member of club or organization: Not on file    Attends meetings of clubs or organizations: Not on file    Relationship status: Not on file  . Intimate partner violence    Fear of current or ex partner: Not on file    Emotionally abused: Not on file    Physically abused: Not on file    Forced sexual activity: Not on file  Other Topics Concern  . Not on file  Social History Narrative   Regular exercise-no    Current Outpatient Medications on File Prior to Visit  Medication Sig Dispense Refill  . amLODipine (NORVASC) 10 MG tablet Take 1 tablet (10 mg total) by mouth daily. (Patient taking differently: Take 10 mg by mouth daily. ) 90  tablet 0  . aspirin EC 81 MG tablet Take 81 mg by mouth daily.    . dapagliflozin propanediol (FARXIGA) 10 MG TABS tablet Take 10 mg by mouth daily. (Patient taking differently: Take 10 mg by mouth daily. ) 90 tablet 3  . glucose blood (ONE TOUCH ULTRA TEST) test strip 1 each by Other route 2 (two) times daily. And lancets 2/day                        . 180 each 3  . Insulin Glargine (BASAGLAR KWIKPEN) 100 UNIT/ML SOPN Inject 0.7 mLs (70 Units total) into the skin every morning. And pen needles 1/day (Patient taking differently: Inject 75 Units into the skin every morning. And pen needles 1/day) 25 pen 3  . Menthol, Topical Analgesic, (BENGAY EX) Apply 1 application topically daily as needed (pain).    . metoprolol tartrate (LOPRESSOR) 100 MG tablet Take 100 mg by mouth daily at 12 noon.    . sildenafil (VIAGRA) 100 MG tablet Take 100 mg by mouth daily as needed.  2  . traMADol (ULTRAM) 50 MG tablet Take 1-2 tablets (50-100 mg total) by mouth every 6 (six) hours as needed for moderate pain. 15 tablet 0  . TRULICITY 1.5 0000000 SOPN INJECT 1.5 MG UNDER THE SKIN ONCE A WEEK (Patient taking differently: No sig reported) 6 mL 3  . valsartan-hydrochlorothiazide (DIOVAN-HCT) 320-25 MG per tablet Take 1 tablet by mouth daily.      No current  facility-administered medications on file prior to visit.     Allergies  Allergen Reactions  . Actos [Pioglitazone] Swelling    SWELLING REACTION UNSPECIFIED  EDEMA    Family History  Problem Relation Age of Onset  . Arthritis Father   . Hypertension Father   . Diabetes Father   . Arthritis Mother   . Hypertension Mother   . Heart attack Brother 71  . Diabetes Brother   . Lymphoma Paternal Grandfather   . Cancer Maternal Uncle   . Cancer Paternal Uncle        type unknown    BP 140/64 (BP Location: Right Arm, Patient Position: Sitting, Cuff Size: Large)   Pulse 76   Ht 5\' 11"  (1.803 m)   Wt 234 lb 6.4 oz (106.3 kg)   SpO2 96%   BMI 32.69 kg/m    Review of Systems He denies hypoglycemia.      Objective:   Physical Exam VITAL SIGNS:  See vs page GENERAL: no distress Pulses: dorsalis pedis intact bilat.   MSK: no deformity of the feet CV: trace bilat leg edema Skin:  no ulcer on the feet.  normal color and temp on the feet, but there is hyperpigmentation of the legs.   Neuro: sensation is intact to touch on the feet Ext: there is bilateral onychomycosis of the toenails.  Both great toenails are absent.    Lab Results  Component Value Date   HGBA1C 6.8 (A) 10/08/2019   Lab Results  Component Value Date   CREATININE 1.50 (H) 05/15/2019   BUN 29 (H) 05/15/2019   NA 141 05/15/2019   K 3.7 05/15/2019   CL 105 05/15/2019   CO2 29 04/26/2017       Assessment & Plan:  HTN: is noted today Insulin-requiring type 2 DM, with CAD: overcontrolled, given this regimen, which does match insulin to his changing needs throughout the day.  Renal insuff: This limits rx options  Patient Instructions  Your blood pressure is high today.  Please see your primary care provider soon, to have it rechecked check your blood sugar once a day.  vary the time of day when you check, between before the 3 meals, and at bedtime.  also check if you have symptoms of your blood sugar being  too high or too low.  please keep a record of the readings and bring it to your next appointment here.  please call us sooner if you are having low blood sugar episodes, or if it is running above 200.  A different type of diabetes blood test are requested for you today.  We'll let you know about the results.  On this type of insulin schedule, you should eat meals on a regular schedule.  If a meal is missed or significantly delayed, your blood sugar could go low.   Please come back for a follow-up appointment in 4 months.

## 2019-10-13 LAB — FRUCTOSAMINE: Fructosamine: 235 umol/L (ref 205–285)

## 2019-11-05 NOTE — Progress Notes (Signed)
Cardiology Office Note:    Date:  11/06/2019   ID:  Matthew Schmitt, DOB 1951/02/13, MRN [id number]  PCP:  Isaias Sakai, DO  Cardiologist:  Shirlee More, MD    Referring MD: Alonna Buckler*    ASSESSMENT:    1. Coronary artery disease of native artery of native heart with stable angina pectoris ([place])   2. Hyperlipidemia   3. Hypertensive heart disease without heart failure   4. PAF (paroxysmal atrial fibrillation) ([place])   5. RBBB (right bundle branch block with left anterior fascicular block)    PLAN:    In order of problems listed above:  1. Stable continue aspirin beta-blocker initiate statin New [place] Heart Association class I having no angina at this time would not advise an ischemia evaluation 2. He is at increased risk diabetes CAD initiate a statin goal LDL be less than 70 ideally less than 50 3. Poorly controlled optimize treatment by switching metoprolol week blood pressure effect and if remains greater than 140 add MRA 4. Stable no recurrence 5. At increased risk of high degree heart block and needing a pacemaker PR interval is normal do an EKG each time he is seen I asked him to contact me if he were to have an episode of syncope or near syncope   Next appointment: 6 months   Medication Adjustments/Labs and Tests Ordered: Current medicines are reviewed at length with the patient today.  Concerns regarding medicines are outlined above.  No orders of the defined types were placed in this encounter.  No orders of the defined types were placed in this encounter.   Chief Complaint  Patient presents with  . Follow-up    History of Present Illness:    DAVIAN AGREDANO is a 68 y.o. male with a hx of CAD, PAF, Hypertension, T2 DM  and hyperlipidemia last seen 11/27/2018. Compliance with diet, lifestyle and medications: Yes  He came today with the purpose and he is concerned about his blood pressure which typically runs greater than [id number] systolic.   He has a little bit of dependent edema but does not want to stop his calcium channel blocker.  He takes a high dose of Lopressor in order to switch to carvedilol to optimize antihypertensive control if another agent is needed distal diuretic.  He is not on a statin he has CAD diabetes and after discussion of benefits we will go ahead and initiate rosuvastatin with follow-up CMP and lipid profile in 6 weeks.  He is having no angina shortness of breath orthopnea palpitation or syncope.  He has bifascicular heart block with PR interval is normal and has no symptoms of bradycardia.  Recent labs from [facility] show mild renal dysfunction and LDL of 90-100. Past Medical History:  Diagnosis Date  . Anxiety   . Arthritis    everywhere  . BPH associated with nocturia   . Coronary artery disease    cardiologist-  dr Bettina Gavia (previouly dr Wynonia Lawman)  . Essential hypertension, benign   . First degree heart block   . Heart murmur   . History of bladder stone   . History of kidney stones   . History of melanoma excision    June 2016  --- s/p  excision right  leg (per pt localized and no recurrence)  . History of renal cell carcinoma 01/2017   s/p  left partial nephrecotmy  . Impotence of organic origin   . Mixed hyperlipidemia    slightly  .  Multiple renal cysts    bilateral  . PAF (paroxysmal atrial fibrillation) ([place])    followed by cardiology  (takes ASA)  . RBBB   . Right ureteral stone   . Type 2 diabetes mellitus treated with insulin [facility])    endocrinologist-  dr Loanne Drilling  . Wears glasses     Past Surgical History:  Procedure Laterality Date  . CARDIAC CATHETERIZATION  08-17-2011  DR SPENCER TILLEY   POSSIBLE MODERATE LAD STENOSIS JUST AFTER THE BIFURCATION OF LARGE DIAGONAL BRANCH/ LVEF  40-45%/ MILD GLOBAL HYPODINESIS (CATH DONE FOR ABNORMAL STRESS TEST OF FIXED LATERAL WALL DEFECT & BIFASCICULAR BLAOCK)  . CARDIOVERSION N/A 04/02/2013   Procedure: CARDIOVERSION;  Surgeon: Jacolyn Reedy, MD;   Location: [place];  Service: Cardiovascular;  Laterality: N/A;  . CYSTOSCOPY WITH LITHOLAPAXY N/A 05/18/2015   Procedure: CYSTOSCOPY WITH LITHOLAPAXY;  Surgeon: Rana Snare, MD;  Location[facility];  Service: Urology;  Laterality: N/A;  . CYSTOSCOPY WITH LITHOLAPAXY N/A 01/04/2017   Procedure: CYSTOSCOPY WITH LITHOLAPAXY, Holmium Laser;  Surgeon: Ardis Hughs, MD;  [facility];  Service: Urology;  Laterality: N/A;  . CYSTOSCOPY WITH RETROGRADE PYELOGRAM, URETEROSCOPY AND STENT PLACEMENT  11/03/2012   Procedure: CYSTOSCOPY WITH RETROGRADE PYELOGRAM, URETEROSCOPY AND STENT PLACEMENT;  Surgeon: Bernestine Amass, MD;  Location: [facility];  Service: Urology;  Laterality: Left;  Cystoscopy/Left Retrograde/Ureteroscopy/Holmium Laser Litho/Double J Stent  . CYSTOSCOPY WITH URETEROSCOPY, STONE BASKETRY AND STENT PLACEMENT Right 05/15/2019   Procedure: CYSTOSCOPY WITH URETEROSCOPY, LASER LITHOTRIPSY STONE BASKETRY AND STENT PLACEMENT, RIGHT RETROGRADE;  Surgeon: Ardis Hughs, MD;  Location: [facility];  Service: Urology;  Laterality: Right;  . HOLMIUM LASER APPLICATION  123456   Procedure: HOLMIUM LASER APPLICATION;  Surgeon: Bernestine Amass, MD;  Location: [facility];  Service: Urology;  Laterality: Left;  . HOLMIUM LASER APPLICATION N/A 0000000   Procedure: HOLMIUM LASER APPLICATION;  Surgeon: Rana Snare, MD;  Location: [facility];  Service: Urology;  Laterality: N/A;  . KNEE SURGERY Bilateral [place]  . MELANOMA EXCISION Bilateral June 2016   20th-- left leg //  1st-- right leg w/ skin graft from right thigh (no lymph node bx)  . NEPHROLITHOTOMY Right 05/17/2014   Procedure: NEPHROLITHOTOMY PERCUTANEOUS;  Surgeon: Bernestine Amass, MD;  [facility];  Service: Urology;  Laterality: Right;  . PILONIDAL CYST EXCISION    . ROBOTIC ASSITED PARTIAL NEPHRECTOMY Left 06/18/2013   Procedure: ROBOTIC  ASSISTED PARTIAL NEPHRECTOMY;  Surgeon: Dutch Gray, MD;  [facility];  Service: Urology;  Laterality: Left;  . ROTATOR CUFF REPAIR Left 07/2016  . TONSILLECTOMY  35  (age 67)  . TOTAL KNEE ARTHROPLASTY Left 02/07/2017   Procedure: LEFT TOTAL KNEE ARTHROPLASTY;  Surgeon: Leandrew Koyanagi, MD;  Location: [place];  Service: Orthopedics;  Laterality: Left;  . TOTAL KNEE ARTHROPLASTY Right 04/25/2017   Procedure: RIGHT TOTAL KNEE ARTHROPLASTY;  Surgeon: Leandrew Koyanagi, MD;  Location: [place];  Service: Orthopedics;  Laterality: Right;  . TRANSURETHRAL RESECTION OF PROSTATE N/A 01/04/2017   Procedure: TRANSURETHRAL RESECTION OF THE PROSTATE (TURP);  Surgeon: Ardis Hughs, MD;  [facility];  Service: Urology;  Laterality: N/A;    Current Medications: Current Meds  Medication Sig  . amLODipine (NORVASC) 10 MG tablet Take 1 tablet (10 mg total) by mouth daily. (Patient taking differently: Take 10 mg by mouth daily. )  . amLODIPine-Valsartan-HCTZ 10-320-25 MG  TABS Take by mouth.  Marland Kitchen aspirin EC 81 MG tablet Take 81 mg by mouth daily.  . dapagliflozin propanediol (FARXIGA) 10 MG TABS tablet Take 10 mg by mouth daily. (Patient taking differently: Take 10 mg by mouth daily. )  . glucose blood (ONE TOUCH ULTRA TEST) test strip 1 each by Other route 2 (two) times daily. And lancets 2/day                        .  . Insulin Glargine (BASAGLAR KWIKPEN) 100 UNIT/ML SOPN Inject 0.7 mLs (70 Units total) into the skin every morning. And pen needles 1/day (Patient taking differently: Inject 75 Units into the skin every morning. And pen needles 1/day)  . Menthol, Topical Analgesic, (BENGAY EX) Apply 1 application topically daily as needed (pain).  . metoprolol tartrate (LOPRESSOR) 100 MG tablet Take 100 mg by mouth daily at 12 noon.  . sildenafil (VIAGRA) 100 MG tablet Take 100 mg by mouth daily as needed.  . TRULICITY 1.5 0000000 SOPN INJECT 1.5 MG UNDER THE SKIN ONCE A WEEK (Patient taking differently: No sig  reported)     Allergies:   Actos [pioglitazone]   Social History   Socioeconomic History  . Marital status: Married    Spouse name: Not on file  . Number of children: 2  . Years of education: 62  . Highest education level: Not on file  Occupational History  . Occupation: Government social research officer, Psychologist, occupational  Tobacco Use  . Smoking status: Former Smoker    Packs/day: 1.00    Years: 6.00    Pack years: 6.00    Types: Cigarettes    Quit date: 10/31/1969    Years since quitting: 50.0  . Smokeless tobacco: Never Used  Substance and Sexual Activity  . Alcohol use: Yes    Alcohol/week: 0.0 standard drinks    Comment: OCCASIONAL  . Drug use: No  . Sexual activity: Never  Other Topics Concern  . Not on file  Social History Narrative   Regular exercise-no   Social Determinants of Health   Financial Resource Strain:   . Difficulty of Paying Living Expenses: Not on file  Food Insecurity:   . Worried About Charity fundraiser in the Last Year: Not on file  . Ran Out of Food in the Last Year: Not on file  Transportation Needs:   . Lack of Transportation (Medical): Not on file  . Lack of Transportation (Non-Medical): Not on file  Physical Activity:   . Days of Exercise per Week: Not on file  . Minutes of Exercise per Session: Not on file  Stress:   . Feeling of Stress : Not on file  Social Connections:   . Frequency of Communication with Friends and Family: Not on file  . Frequency of Social Gatherings with Friends and Family: Not on file  . Attends Religious Services: Not on file  . Active Member of Clubs or Organizations: Not on file  . Attends Archivist Meetings: Not on file  . Marital Status: Not on file     Family History: The patient's family history includes Arthritis in his father and mother; Cancer in his maternal uncle and paternal uncle; Diabetes in his brother and father; Heart attack (age of onset: 72) in his brother; Hypertension in his father and  mother; Lymphoma in his paternal grandfather. ROS:   Please see the history of present illness.    All other systems reviewed  and are negative.  EKGs/Labs/Other Studies Reviewed:    The following studies were reviewed today:  EKG:  EKG ordered today and personally reviewed.  The ekg ordered today demonstrates sinus rhythm bifascicular heart block QRS is relatively narrow PR interval normal  Recent Labs: 05/15/2019: BUN 29; Creatinine, Ser 1.50; Hemoglobin 15.0; Potassium 3.7; Sodium 141  Recent Lipid Panel    Component Value Date/Time   CHOL 135 07/20/2014 0822   TRIG 130 07/20/2014 0822   HDL 32 (L) 07/20/2014 0822   CHOLHDL 4.2 07/20/2014 0822   VLDL 26 07/20/2014 0822   LDLCALC 77 07/20/2014 0822    Physical Exam:    VS:  BP (!) 170/88 (BP Location: Right Arm, Patient Position: Sitting, Cuff Size: Normal)   Pulse 69   Ht 5\' 11"  (1.803 m)   Wt 236 lb (107 kg)   SpO2 95%   BMI 32.92 kg/m     Wt Readings from Last 3 Encounters:  11/06/19 236 lb (107 kg)  10/08/19 234 lb 6.4 oz (106.3 kg)  06/05/19 232 lb (105.2 kg)     GEN:  Well nourished, well developed in no acute distress HEENT: Normal NECK: No JVD; No carotid bruits LYMPHATICS: No lymphadenopathy CARDIAC: RRR, no murmurs, rubs, gallops RESPIRATORY:  Clear to auscultation without rales, wheezing or rhonchi  ABDOMEN: Soft, non-tender, non-distended MUSCULOSKELETAL:  No edema; No deformity  SKIN: Warm and dry NEUROLOGIC:  Alert and oriented x 3 PSYCHIATRIC:  Normal affect    Signed, Shirlee More, MD  11/06/2019 8:48 AM    [place]

## 2019-11-06 ENCOUNTER — Other Ambulatory Visit: Payer: Self-pay

## 2019-11-06 ENCOUNTER — Encounter: Payer: Self-pay | Admitting: Cardiology

## 2019-11-06 ENCOUNTER — Ambulatory Visit (INDEPENDENT_AMBULATORY_CARE_PROVIDER_SITE_OTHER): Payer: BC Managed Care – PPO | Admitting: Cardiology

## 2019-11-06 VITALS — BP 170/88 | HR 69 | Ht 71.0 in | Wt 236.0 lb

## 2019-11-06 DIAGNOSIS — I119 Hypertensive heart disease without heart failure: Secondary | ICD-10-CM | POA: Diagnosis not present

## 2019-11-06 DIAGNOSIS — I480 Paroxysmal atrial fibrillation: Secondary | ICD-10-CM

## 2019-11-06 DIAGNOSIS — I25118 Atherosclerotic heart disease of native coronary artery with other forms of angina pectoris: Secondary | ICD-10-CM

## 2019-11-06 DIAGNOSIS — I452 Bifascicular block: Secondary | ICD-10-CM | POA: Diagnosis not present

## 2019-11-06 DIAGNOSIS — E782 Mixed hyperlipidemia: Secondary | ICD-10-CM

## 2019-11-06 MED ORDER — ROSUVASTATIN CALCIUM 10 MG PO TABS: 10.0000 mg | ORAL_TABLET | Freq: Every day | ORAL | 1 refills | Status: DC

## 2019-11-06 MED ORDER — CARVEDILOL 25 MG PO TABS: 12.5000 mg | ORAL_TABLET | Freq: Two times a day (BID) | ORAL | 1 refills | Status: DC

## 2019-11-06 NOTE — Patient Instructions (Addendum)
Medication Instructions:  Your physician has recommended you make the following change in your medication:   STOP metoprolol tartrate (lopressor)   START rosuvastatin (crestor) 10 mg: Take 1 tablet daily  START carvedilol (coreg) 25 mg: Take 1/2 tablet (12.5 mg) twice daily. Check your blood pressure daily at the same time every day at least 1-2 hours after taking your medications and if your systolic BP (top number) is greater than 140, you can take 1 tablet (25 mg) twice daily.   *If you need a refill on your cardiac medications before your next appointment, please call your pharmacy*  Lab Work: Your physician recommends that you return for lab work in 2 months: lipid panel, CMP. Please return to our office for lab work, no appointment needed. Please fast beforehand.   If you have labs (blood work) drawn today and your tests are completely normal, you will receive your results only by: Marland Kitchen MyChart Message (if you have MyChart) OR . A paper copy in the mail If you have any lab test that is abnormal or we need to change your treatment, we will call you to review the results.  Testing/Procedures: You had an EKG today.   Follow-Up: At [facility], you and your health needs are our priority.  As part of our continuing mission to provide you with exceptional heart care, we have created designated Provider Care Teams.  These Care Teams include your primary Cardiologist (physician) and Advanced Practice Providers (APPs -  Physician Assistants and Nurse Practitioners) who all work together to provide you with the care you need, when you need it.  Your next appointment:   6 month(s)  The format for your next appointment:   In Person  Provider:   Shirlee More, MD   Rosuvastatin Tablets What is this medicine? ROSUVASTATIN (roe SOO va sta tin) is known as a HMG-CoA reductase inhibitor or 'statin'. It lowers cholesterol and triglycerides in the blood. This drug may also reduce the risk of  heart attack, stroke, or other health problems in patients with risk factors for heart disease. Diet and lifestyle changes are often used with this drug. This medicine may be used for other purposes; ask your health care provider or pharmacist if you have questions. COMMON BRAND NAME(S): Crestor What should I tell my health care provider before I take this medicine? They need to know if you have any of these conditions:  diabetes  if you often drink alcohol  history of stroke  kidney disease  liver disease  muscle aches or weakness  thyroid disease  an unusual or allergic reaction to rosuvastatin, other medicines, foods, dyes, or preservatives  pregnant or trying to get pregnant  breast-feeding How should I use this medicine? Take this medicine by mouth with a glass of water. Follow the directions on the prescription label. Do not cut, crush or chew this medicine. You can take this medicine with or without food. Take your doses at regular intervals. Do not take your medicine more often than directed. Talk to your pediatrician regarding the use of this medicine in children. While this drug may be prescribed for children as young as 87 years old for selected conditions, precautions do apply. Overdosage: If you think you have taken too much of this medicine contact a poison control center or emergency room at once. NOTE: This medicine is only for you. Do not share this medicine with others. What if I miss a dose? If you miss a dose, take it as  soon as you can. If your next dose is to be taken in less than 12 hours, then do not take the missed dose. Take the next dose at your regular time. Do not take double or extra doses. What may interact with this medicine? Do not take this medicine with any of the following medications:  herbal medicines like red yeast rice This medicine may also interact with the following medications:  alcohol  antacids containing aluminum hydroxide or  magnesium hydroxide  cyclosporine  other medicines for high cholesterol  some medicines for HIV infection  warfarin This list may not describe all possible interactions. Give your health care provider a list of all the medicines, herbs, non-prescription drugs, or dietary supplements you use. Also tell them if you smoke, drink alcohol, or use illegal drugs. Some items may interact with your medicine. What should I watch for while using this medicine? Visit your doctor or health care professional for regular check-ups. You may need regular tests to make sure your liver is working properly. Your health care professional may tell you to stop taking this medicine if you develop muscle problems. If your muscle problems do not go away after stopping this medicine, contact your health care professional. Do not become pregnant while taking this medicine. Women should inform their health care professional if they wish to become pregnant or think they might be pregnant. There is a potential for serious side effects to an unborn child. Talk to your health care professional or pharmacist for more information. Do not breast-feed an infant while taking this medicine. This medicine may increase blood sugar. Ask your healthcare provider if changes in diet or medicines are needed if you have diabetes. If you are going to need surgery or other procedure, tell your doctor that you are using this medicine. This drug is only part of a total heart-health program. Your doctor or a dietician can suggest a low-cholesterol and low-fat diet to help. Avoid alcohol and smoking, and keep a proper exercise schedule. This medicine may cause a decrease in Co-Enzyme Q-10. You should make sure that you get enough Co-Enzyme Q-10 while you are taking this medicine. Discuss the foods you eat and the vitamins you take with your health care professional. What side effects may I notice from receiving this medicine? Side effects that you  should report to your doctor or health care professional as soon as possible:  allergic reactions like skin rash, itching or hives, swelling of the face, lips, or tongue  confusion  joint pain  loss of memory  redness, blistering, peeling or loosening of the skin, including inside the mouth  signs and symptoms of high blood sugar such as being more thirsty or hungry or having to urinate more than normal. You may also feel very tired or have blurry vision.  signs and symptoms of muscle injury like dark urine; trouble passing urine or change in the amount of urine; unusually weak or tired; muscle pain or side or back pain  yellowing of the eyes or skin Side effects that usually do not require medical attention (report to your doctor or health care professional if they continue or are bothersome):  constipation  diarrhea  dizziness  gas  headache  nausea  stomach pain  trouble sleeping  upset stomach This list may not describe all possible side effects. Call your doctor for medical advice about side effects. You may report side effects to FDA at 1-800-FDA-1088. Where should I keep my medicine? Keep out  of the reach of children. Store at room temperature between 20 and 25 degrees C (68 and 77 degrees F). Keep container tightly closed (protect from moisture). Throw away any unused medicine after the expiration date. NOTE: This sheet is a summary. It may not cover all possible information. If you have questions about this medicine, talk to your doctor, pharmacist, or health care provider.  2020 Elsevier/Gold Standard (2018-08-28 08:25:08)    Carvedilol tablets What is this medicine? CARVEDILOL (KAR ve dil ol) is a beta-blocker. Beta-blockers reduce the workload on the heart and help it to beat more regularly. This medicine is used to treat high blood pressure and heart failure. This medicine may be used for other purposes; ask your health care provider or pharmacist if you  have questions. COMMON BRAND NAME(S): Coreg What should I tell my health care provider before I take this medicine? They need to know if you have any of these conditions:  circulation problems  diabetes  history of heart attack or heart disease  liver disease  lung or breathing disease, like asthma or emphysema  pheochromocytoma  slow or irregular heartbeat  thyroid disease  an unusual or allergic reaction to carvedilol, other beta-blockers, medicines, foods, dyes, or preservatives  pregnant or trying to get pregnant  breast-feeding How should I use this medicine? Take this medicine by mouth with a glass of water. Follow the directions on the prescription label. It is best to take the tablets with food. Take your doses at regular intervals. Do not take your medicine more often than directed. Do not stop taking except on the advice of your doctor or health care professional. Talk to your pediatrician regarding the use of this medicine in children. Special care may be needed. Overdosage: If you think you have taken too much of this medicine contact a poison control center or emergency room at once. NOTE: This medicine is only for you. Do not share this medicine with others. What if I miss a dose? If you miss a dose, take it as soon as you can. If it is almost time for your next dose, take only that dose. Do not take double or extra doses. What may interact with this medicine? This medicine may interact with the following medications:  certain medicines for blood pressure, heart disease, irregular heart beat  certain medicines for depression, like fluoxetine or paroxetine  certain medicines for diabetes, like glipizide or glyburide  cimetidine  clonidine  cyclosporine  digoxin  MAOIs like Carbex, Eldepryl, Marplan, Nardil, and Parnate  reserpine  rifampin This list may not describe all possible interactions. Give your health care provider a list of all the  medicines, herbs, non-prescription drugs, or dietary supplements you use. Also tell them if you smoke, drink alcohol, or use illegal drugs. Some items may interact with your medicine. What should I watch for while using this medicine? Check your heart rate and blood pressure regularly while you are taking this medicine. Ask your doctor or health care professional what your heart rate and blood pressure should be, and when you should contact him or her. Do not stop taking this medicine suddenly. This could lead to serious heart-related effects. Contact your doctor or health care professional if you have difficulty breathing while taking this drug. Check your weight daily. Ask your doctor or health care professional when you should notify him/her of any weight gain. You may get drowsy or dizzy. Do not drive, use machinery, or do anything that requires mental alertness  until you know how this medicine affects you. To reduce the risk of dizzy or fainting spells, do not sit or stand up quickly. Alcohol can make you more drowsy, and increase flushing and rapid heartbeats. Avoid alcoholic drinks. This medicine may increase blood sugar. Ask your healthcare provider if changes in diet or medicines are needed if you have diabetes. If you are going to have surgery, tell your doctor or health care professional that you are taking this medicine. What side effects may I notice from receiving this medicine? Side effects that you should report to your doctor or health care professional as soon as possible:  allergic reactions like skin rash, itching or hives, swelling of the face, lips, or tongue  breathing problems  dark urine  irregular heartbeat   signs and symptoms of high blood sugar such as being more thirsty or hungry or having to urinate more than normal. You may also feel very tired or have blurry vision.  swollen legs or ankles  vomiting  yellowing of the eyes or skin Side effects that usually  do not require medical attention (report to your doctor or health care professional if they continue or are bothersome):  change in sex drive or performance  diarrhea  dry eyes (especially if wearing contact lenses)  dry, itching skin  headache  nausea  unusually tired This list may not describe all possible side effects. Call your doctor for medical advice about side effects. You may report side effects to FDA at 1-800-FDA-1088. Where should I keep my medicine? Keep out of the reach of children. Store at room temperature below 30 degrees C (86 degrees F). Protect from moisture. Keep container tightly closed. Throw away any unused medicine after the expiration date. NOTE: This sheet is a summary. It may not cover all possible information. If you have questions about this medicine, talk to your doctor, pharmacist, or health care provider.  2020 Elsevier/Gold Standard (2018-08-27 09:13:57)    Purchase on line at [employer] or at [employer] on [address]

## 2019-12-25 ENCOUNTER — Ambulatory Visit: Payer: BC Managed Care – PPO | Admitting: Physician Assistant

## 2019-12-25 ENCOUNTER — Ambulatory Visit: Payer: BC Managed Care – PPO | Admitting: Orthopaedic Surgery

## 2020-01-01 ENCOUNTER — Encounter: Payer: Self-pay | Admitting: Orthopaedic Surgery

## 2020-01-01 ENCOUNTER — Other Ambulatory Visit: Payer: Self-pay

## 2020-01-01 ENCOUNTER — Ambulatory Visit: Payer: Self-pay

## 2020-01-01 ENCOUNTER — Ambulatory Visit: Payer: BC Managed Care – PPO | Admitting: Orthopaedic Surgery

## 2020-01-01 DIAGNOSIS — G8929 Other chronic pain: Secondary | ICD-10-CM | POA: Diagnosis not present

## 2020-01-01 DIAGNOSIS — M25511 Pain in right shoulder: Secondary | ICD-10-CM | POA: Diagnosis not present

## 2020-01-01 DIAGNOSIS — Z96652 Presence of left artificial knee joint: Secondary | ICD-10-CM

## 2020-01-01 DIAGNOSIS — E782 Mixed hyperlipidemia: Secondary | ICD-10-CM | POA: Diagnosis not present

## 2020-01-01 DIAGNOSIS — I452 Bifascicular block: Secondary | ICD-10-CM | POA: Diagnosis not present

## 2020-01-01 DIAGNOSIS — Z96611 Presence of right artificial shoulder joint: Secondary | ICD-10-CM

## 2020-01-01 DIAGNOSIS — I119 Hypertensive heart disease without heart failure: Secondary | ICD-10-CM | POA: Diagnosis not present

## 2020-01-01 DIAGNOSIS — I25118 Atherosclerotic heart disease of native coronary artery with other forms of angina pectoris: Secondary | ICD-10-CM | POA: Diagnosis not present

## 2020-01-01 HISTORY — DX: Presence of left artificial knee joint: Z96.652

## 2020-01-01 NOTE — Progress Notes (Signed)
Office Visit Note   Patient: Matthew Schmitt           Date of Birth: 07-20-1951           MRN: [id number] Visit Date: 01/01/2020              Requested by: Isaias Sakai, [address] [place],  [place] [address] PCP: Isaias Sakai, DO   Assessment & Plan: Visit Diagnoses:  1. Status post total left knee replacement   2. Chronic right shoulder pain     Plan: Impression is stable left total knee replacement.  I would like to see him every 6 months with repeat 2 view x-rays of the left knee to monitor the tibial tray.  In terms of the right shoulder he did not get any relief from the cortisone injection and he states the pain is worse and therefore we will obtain an MRI of the right shoulder to rule out structural abnormalities.  Follow-up in about a week and a half to review the MRI.  Follow-Up Instructions: Return in about 10 days (around 01/11/2020).   Orders:  Orders Placed This Encounter  Procedures  . XR KNEE 3 VIEW LEFT   No orders of the defined types were placed in this encounter.     Procedures: No procedures performed   Clinical Data: No additional findings.   Subjective: Chief Complaint  Patient presents with  . Left Knee - Pain  . Right Shoulder - Pain    [name] returns today for follow-up of both his left knee replacement continued right shoulder pain.  In terms of left knee he does not report any clinical worsening.  He feels sore at times.  He does not have any start up stiffness.  He has stable mild swelling in the left knee.  No signs of infection.  In terms of the right shoulder he states that the injection did not help much at all.  He has more pain now with raising his arm his head such as putting away dishes.  He is status post left shoulder arthroscopy and debridement 7 years ago.   Review of Systems   Objective: Vital Signs: There were no vitals taken for this visit.  Physical Exam  Ortho Exam Left knee exam  shows fully healed surgical scar.  Mild effusion.  Excellent range of motion without significant pain..  Collaterals are stable. Right shoulder exam shows positive impingement sign and pain with supraspinatus testing. Specialty Comments:  No specialty comments available.  Imaging: XR KNEE 3 VIEW LEFT  Result Date: 01/01/2020 Stable tibial tray without any progression of lucency or worsening.    PMFS History: Patient Active Problem List   Diagnosis Date Noted  . Chronic right shoulder pain 01/01/2020  . Status post total left knee replacement 01/01/2020  . Left-sided low back pain with left-sided sciatica 01/23/2019  . Diabetes ([place]) 01/18/2018  . History of bilateral knee replacement 02/07/2017  . Primary osteoarthritis of left knee   . BPH with obstruction/lower urinary tract symptoms 01/04/2017  . Rotator cuff syndrome of left shoulder 04/17/2016  . Nephrolithiasis 05/17/2014  . Encounter for long-term (current) use of other medications 07/27/2013  . Long term current use of anticoagulant therapy   . CAD (coronary artery disease) 04/02/2013  . Hypertensive heart disease 04/02/2013  . 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 Atrial flutter ([place])   . Nonischemic cardiomyopathy ([place])   . Obesity (  BMI 30-39.9)   . History of kidney stones   . Osteoarthritis   . Hyperlipidemia    Past Medical History:  Diagnosis Date  . Anxiety   . Arthritis    everywhere  . BPH associated with nocturia   . Coronary artery disease    cardiologist-  dr Bettina Gavia (previouly dr Wynonia Lawman)  . Essential hypertension, benign   . First degree heart block   . Heart murmur   . History of bladder stone   . History of kidney stones   . History of melanoma excision    June 2016  --- s/p  excision right  leg (per pt localized and no recurrence)  . History of renal cell carcinoma 01/2017   s/p  left partial nephrecotmy  . Impotence of organic origin   . Mixed  hyperlipidemia    slightly  . Multiple renal cysts    bilateral  . PAF (paroxysmal atrial fibrillation) ([place])    followed by cardiology  (takes ASA)  . RBBB   . Right ureteral stone   . Type 2 diabetes mellitus treated with insulin [facility])    endocrinologist-  dr Loanne Drilling  . Wears glasses     Family History  Problem Relation Age of Onset  . Arthritis Father   . Hypertension Father   . Diabetes Father   . Arthritis Mother   . Hypertension Mother   . Heart attack Brother 40  . Diabetes Brother   . Lymphoma Paternal Grandfather   . Cancer Maternal Uncle   . Cancer Paternal Uncle        type unknown    Past Surgical History:  Procedure Laterality Date  . CARDIAC CATHETERIZATION  08-17-2011  DR SPENCER TILLEY   POSSIBLE MODERATE LAD STENOSIS JUST AFTER THE BIFURCATION OF LARGE DIAGONAL BRANCH/ LVEF  40-45%/ MILD GLOBAL HYPODINESIS (CATH DONE FOR ABNORMAL STRESS TEST OF FIXED LATERAL WALL DEFECT & BIFASCICULAR BLAOCK)  . CARDIOVERSION N/A 04/02/2013   Procedure: CARDIOVERSION;  Surgeon: Jacolyn Reedy, MD;  Location: [place];  Service: Cardiovascular;  Laterality: N/A;  . CYSTOSCOPY WITH LITHOLAPAXY N/A 05/18/2015   Procedure: CYSTOSCOPY WITH LITHOLAPAXY;  Surgeon: Rana Snare, MD;  Location[facility];  Service: Urology;  Laterality: N/A;  . CYSTOSCOPY WITH LITHOLAPAXY N/A 01/04/2017   Procedure: CYSTOSCOPY WITH LITHOLAPAXY, Holmium Laser;  Surgeon: Ardis Hughs, MD;  Location: [facility];  Service: Urology;  Laterality: N/A;  . CYSTOSCOPY WITH RETROGRADE PYELOGRAM, URETEROSCOPY AND STENT PLACEMENT  11/03/2012   Procedure: CYSTOSCOPY WITH RETROGRADE PYELOGRAM, URETEROSCOPY AND STENT PLACEMENT;  Surgeon: Bernestine Amass, MD;  Location[facility];  Service: Urology;  Laterality: Left;  Cystoscopy/Left Retrograde/Ureteroscopy/Holmium Laser Litho/Double J Stent  . CYSTOSCOPY WITH URETEROSCOPY, STONE BASKETRY AND STENT PLACEMENT Right 05/15/2019   Procedure:  CYSTOSCOPY WITH URETEROSCOPY, LASER LITHOTRIPSY STONE BASKETRY AND STENT PLACEMENT, RIGHT RETROGRADE;  Surgeon: Ardis Hughs, MD;  Location: [facility];  Service: Urology;  Laterality: Right;  . HOLMIUM LASER APPLICATION  [id number]   Procedure: HOLMIUM LASER APPLICATION;  Surgeon: Bernestine Amass, MD;  Location: [place];  Service: Urology;  Laterality: Left;  . HOLMIUM LASER APPLICATION N/A [id number]   Procedure: HOLMIUM LASER APPLICATION;  Surgeon: Rana Snare, MD;  Location[facility];  Service: Urology;  Laterality: N/A;  . KNEE SURGERY Bilateral [place]  . MELANOMA EXCISION Bilateral June 2016   20th-- left leg //  1st-- right leg w/  skin graft from right thigh (no lymph node bx)  . NEPHROLITHOTOMY Right 05/17/2014   Procedure: NEPHROLITHOTOMY PERCUTANEOUS;  Surgeon: Bernestine Amass, MD;  Location: [facility];  Service: Urology;  Laterality: Right;  . PILONIDAL CYST EXCISION    . ROBOTIC ASSITED PARTIAL NEPHRECTOMY Left 06/18/2013   Procedure: ROBOTIC ASSISTED PARTIAL NEPHRECTOMY;  Surgeon: Dutch Gray, MD;  Location: [facility];  Service: Urology;  Laterality: Left;  . ROTATOR CUFF REPAIR Left 07/2016  . TONSILLECTOMY  97  (age 80)  . TOTAL KNEE ARTHROPLASTY Left 02/07/2017   Procedure: LEFT TOTAL KNEE ARTHROPLASTY;  Surgeon: Leandrew Koyanagi, MD;  Location: [place];  Service: Orthopedics;  Laterality: Left;  . TOTAL KNEE ARTHROPLASTY Right 04/25/2017   Procedure: RIGHT TOTAL KNEE ARTHROPLASTY;  Surgeon: Leandrew Koyanagi, MD;  Location: [place];  Service: Orthopedics;  Laterality: Right;  . TRANSURETHRAL RESECTION OF PROSTATE N/A 01/04/2017   Procedure: TRANSURETHRAL RESECTION OF THE PROSTATE (TURP);  Surgeon: Ardis Hughs, MD;  Location: [facility];  Service: Urology;  Laterality: N/A;   Social History   Occupational History  . Occupation: Government social research officer, Psychologist, occupational  Tobacco Use  . Smoking status: Former Smoker    Packs/day: 1.00     Years: 6.00    Pack years: 6.00    Types: Cigarettes    Quit date: 10/31/1969    Years since quitting: 50.2  . Smokeless tobacco: Never Used  Substance and Sexual Activity  . Alcohol use: Yes    Alcohol/week: 0.0 standard drinks    Comment: OCCASIONAL  . Drug use: No  . Sexual activity: Never

## 2020-01-02 LAB — COMPREHENSIVE METABOLIC PANEL
ALT: 40 IU/L (ref 0–44)
AST: 30 IU/L (ref 0–40)
Albumin/Globulin Ratio: 2.3 — ABNORMAL HIGH (ref 1.2–2.2)
Albumin: 4.4 g/dL (ref 3.8–4.8)
Alkaline Phosphatase: 84 IU/L (ref 39–117)
BUN/Creatinine Ratio: 17 (ref 10–24)
BUN: 22 mg/dL (ref 8–27)
Bilirubin Total: 0.5 mg/dL (ref 0.0–1.2)
CO2: 25 mmol/L (ref 20–29)
Calcium: 9.8 mg/dL (ref 8.6–10.2)
Chloride: 103 mmol/L (ref 96–106)
Creatinine, Ser: 1.29 mg/dL — ABNORMAL HIGH (ref 0.76–1.27)
GFR calc Af Amer: 65 mL/min/{1.73_m2} (ref >59)
GFR calc non Af Amer: 56 mL/min/{1.73_m2} — ABNORMAL LOW (ref >59)
Globulin, Total: 1.9 g/dL (ref 1.5–4.5)
Glucose: 177 mg/dL — ABNORMAL HIGH (ref 65–99)
Potassium: 4.2 mmol/L (ref 3.5–5.2)
Sodium: 146 mmol/L — ABNORMAL HIGH (ref 134–144)
Total Protein: 6.3 g/dL (ref 6.0–8.5)

## 2020-01-02 LAB — LIPID PANEL
Chol/HDL Ratio: 3.4 ratio (ref 0.0–5.0)
Cholesterol, Total: 102 mg/dL (ref 100–199)
HDL: 30 mg/dL — ABNORMAL LOW (ref >39)
LDL Chol Calc (NIH): 47 mg/dL (ref 0–99)
Triglycerides: 144 mg/dL (ref 0–149)
VLDL Cholesterol Cal: 25 mg/dL (ref 5–40)

## 2020-01-06 DIAGNOSIS — H5213 Myopia, bilateral: Secondary | ICD-10-CM | POA: Diagnosis not present

## 2020-01-06 DIAGNOSIS — H524 Presbyopia: Secondary | ICD-10-CM | POA: Diagnosis not present

## 2020-01-06 DIAGNOSIS — H52203 Unspecified astigmatism, bilateral: Secondary | ICD-10-CM | POA: Diagnosis not present

## 2020-01-15 ENCOUNTER — Ambulatory Visit: Payer: BC Managed Care – PPO | Admitting: Orthopaedic Surgery

## 2020-01-22 ENCOUNTER — Ambulatory Visit: Payer: Self-pay | Admitting: Orthopaedic Surgery

## 2020-01-27 ENCOUNTER — Ambulatory Visit
Admission: RE | Admit: 2020-01-27 | Discharge: 2020-01-27 | Disposition: A | Discharge status: Home / Self Care | Payer: BC Managed Care – PPO | Source: Ambulatory Visit | Attending: Orthopaedic Surgery | Admitting: Orthopaedic Surgery

## 2020-01-27 ENCOUNTER — Other Ambulatory Visit: Payer: Self-pay

## 2020-01-27 DIAGNOSIS — Z96611 Presence of right artificial shoulder joint: Secondary | ICD-10-CM

## 2020-01-27 DIAGNOSIS — M25511 Pain in right shoulder: Secondary | ICD-10-CM | POA: Diagnosis not present

## 2020-01-28 DIAGNOSIS — Z86018 Personal history of other benign neoplasm: Secondary | ICD-10-CM | POA: Diagnosis not present

## 2020-01-28 DIAGNOSIS — Z85820 Personal history of malignant melanoma of skin: Secondary | ICD-10-CM | POA: Diagnosis not present

## 2020-01-28 DIAGNOSIS — L814 Other melanin hyperpigmentation: Secondary | ICD-10-CM | POA: Diagnosis not present

## 2020-01-28 DIAGNOSIS — L821 Other seborrheic keratosis: Secondary | ICD-10-CM | POA: Diagnosis not present

## 2020-01-29 ENCOUNTER — Ambulatory Visit: Payer: BC Managed Care – PPO | Admitting: Orthopaedic Surgery

## 2020-01-29 ENCOUNTER — Other Ambulatory Visit: Payer: Self-pay

## 2020-01-29 DIAGNOSIS — M67911 Unspecified disorder of synovium and tendon, right shoulder: Secondary | ICD-10-CM | POA: Diagnosis not present

## 2020-01-29 DIAGNOSIS — M7541 Impingement syndrome of right shoulder: Secondary | ICD-10-CM | POA: Insufficient documentation

## 2020-01-29 DIAGNOSIS — M19011 Primary osteoarthritis, right shoulder: Secondary | ICD-10-CM

## 2020-01-29 DIAGNOSIS — M67921 Unspecified disorder of synovium and tendon, right upper arm: Secondary | ICD-10-CM | POA: Diagnosis not present

## 2020-01-29 HISTORY — DX: Unspecified disorder of synovium and tendon, right shoulder: M67.911

## 2020-01-29 HISTORY — DX: Impingement syndrome of right shoulder: M75.41

## 2020-01-29 HISTORY — DX: Primary osteoarthritis, right shoulder: M19.011

## 2020-01-29 HISTORY — DX: Unspecified disorder of synovium and tendon, right upper arm: M67.921

## 2020-01-29 NOTE — Progress Notes (Signed)
Office Visit Note   Patient: Matthew Schmitt           Date of Birth: 08-01-1951           MRN: [id number] Visit Date: 01/29/2020              Requested by: Isaias Sakai, [place],   [id number] PCP: Isaias Sakai, DO   Assessment & Plan: Visit Diagnoses:  1. Tendinopathy of right rotator cuff   2. Tendinopathy of right biceps tendon   3. Impingement syndrome of right shoulder   4. Arthrosis of right acromioclavicular joint     Plan: Impression based on MRI findings shows rotator cuff tendinopathy worst in the subscapularis.  He has severe biceps tendinopathy and degenerative labral tear.  AC joint arthrosis and type II acromion.  These findings were all discussed and reviewed with the patient in detail.  Based on our discussion patient would like to proceed with arthroscopic shoulder surgery and debridement.  Plan is for biceps tenotomy, distal clavicle excision, subacromial decompression.  Rehab and recovery and risks and benefits were discussed in detail today.  Patient will call us to schedule surgery in the near future.  Follow-Up Instructions: Return if symptoms worsen or fail to improve.   Orders:  No orders of the defined types were placed in this encounter.  No orders of the defined types were placed in this encounter.     Procedures: No procedures performed   Clinical Data: No additional findings.   Subjective: Chief Complaint  Patient presents with  . Right Shoulder - Pain    Matthew Schmitt is here to review his MRI today.  He still cannot use his arm above his shoulder without having significant pain.  This is affecting his ADLs significantly.  He is right-hand dominant.   Review of Systems  Constitutional: Negative.   All other systems reviewed and are negative.    Objective: Vital Signs: There were no vitals taken for this visit.  Physical Exam Vitals and nursing note reviewed.  Constitutional:    Appearance: He is well-developed.  Pulmonary:     Effort: Pulmonary effort is normal.  Abdominal:     Palpations: Abdomen is soft.  Skin:    General: Skin is warm.  Neurological:     Mental Status: He is alert and oriented to person, place, and time.  Psychiatric:        Behavior: Behavior normal.        Thought Content: Thought content normal.        Judgment: Judgment normal.     Ortho Exam Right shoulder exam shows positive Speed test.  Bicipital groove is tender to palpation.  Empty can causes mild pain with decent strength.  Positive Hawkins impingement.  Range of motion is slightly limited with moderate pain. Specialty Comments:  No specialty comments available.  Imaging: No results found.   PMFS History: Patient Active Problem List   Diagnosis Date Noted  . Tendinopathy of right rotator cuff 01/29/2020  . Tendinopathy of right biceps tendon 01/29/2020  . Impingement syndrome of right shoulder 01/29/2020  . Arthrosis of right acromioclavicular joint 01/29/2020  . Chronic right shoulder pain 01/01/2020  . Status post total left knee replacement 01/01/2020  . Left-sided low back pain with left-sided sciatica 01/23/2019  . Diabetes ([place]) 01/18/2018  . History of bilateral knee replacement 02/07/2017  . Primary osteoarthritis of left knee   . BPH with obstruction/lower urinary tract symptoms  01/04/2017  . Rotator cuff syndrome of left shoulder 04/17/2016  . Nephrolithiasis 05/17/2014  . Encounter for long-term (current) use of other medications 07/27/2013  . Long term current use of anticoagulant therapy   . CAD (coronary artery disease) 04/02/2013  . Hypertensive heart disease 04/02/2013  . 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 Atrial flutter ([place])   . Nonischemic cardiomyopathy ([place])   . Obesity (BMI 30-39.9)   . History of kidney stones   . Osteoarthritis   . Hyperlipidemia    Past Medical History:  Diagnosis Date   . Anxiety   . Arthritis    everywhere  . BPH associated with nocturia   . Coronary artery disease    cardiologist-  dr Bettina Gavia (previouly dr Wynonia Lawman)  . Essential hypertension, benign   . First degree heart block   . Heart murmur   . History of bladder stone   . History of kidney stones   . History of melanoma excision    June 2016  --- s/p  excision right  leg (per pt localized and no recurrence)  . History of renal cell carcinoma 01/2017   s/p  left partial nephrecotmy  . Impotence of organic origin   . Mixed hyperlipidemia    slightly  . Multiple renal cysts    bilateral  . PAF (paroxysmal atrial fibrillation) ([place])    followed by cardiology  (takes ASA)  . RBBB   . Right ureteral stone   . Type 2 diabetes mellitus treated with insulin [facility])    endocrinologist-  dr Loanne Drilling  . Wears glasses     Family History  Problem Relation Age of Onset  . Arthritis Father   . Hypertension Father   . Diabetes Father   . Arthritis Mother   . Hypertension Mother   . Heart attack Brother 47  . Diabetes Brother   . Lymphoma Paternal Grandfather   . Cancer Maternal Uncle   . Cancer Paternal Uncle        type unknown    Past Surgical History:  Procedure Laterality Date  . CARDIAC CATHETERIZATION  08-17-2011  DR SPENCER TILLEY   POSSIBLE MODERATE LAD STENOSIS JUST AFTER THE BIFURCATION OF LARGE DIAGONAL BRANCH/ LVEF  40-45%/ MILD GLOBAL HYPODINESIS (CATH DONE FOR ABNORMAL STRESS TEST OF FIXED LATERAL WALL DEFECT & BIFASCICULAR BLAOCK)  . CARDIOVERSION N/A 04/02/2013   Procedure: CARDIOVERSION;  Surgeon: Jacolyn Reedy, MD;  Location: [place];  Service: Cardiovascular;  Laterality: N/A;  . CYSTOSCOPY WITH LITHOLAPAXY N/A 05/18/2015   Procedure: CYSTOSCOPY WITH LITHOLAPAXY;  Surgeon: Rana Snare, MD;  Location: [facility];  Service: Urology;  Laterality: N/A;  . CYSTOSCOPY WITH LITHOLAPAXY N/A 01/04/2017   Procedure: CYSTOSCOPY WITH LITHOLAPAXY, Holmium Laser;  Surgeon:  Ardis Hughs, MD;  Location[facility];  Service: Urology;  Laterality: N/A;  . CYSTOSCOPY WITH RETROGRADE PYELOGRAM, URETEROSCOPY AND STENT PLACEMENT  11/03/2012   Procedure: CYSTOSCOPY WITH RETROGRADE PYELOGRAM, URETEROSCOPY AND STENT PLACEMENT;  Surgeon: Bernestine Amass, MD;  [facility];  Service: Urology;  Laterality: Left;  Cystoscopy/Left Retrograde/Ureteroscopy/Holmium Laser Litho/Double J Stent  . CYSTOSCOPY WITH URETEROSCOPY, STONE BASKETRY AND STENT PLACEMENT Right 05/15/2019   Procedure: CYSTOSCOPY WITH URETEROSCOPY, LASER LITHOTRIPSY STONE BASKETRY AND STENT PLACEMENT, RIGHT RETROGRADE;  Surgeon: Ardis Hughs, MD;  Location[facility];  Service: Urology;  Laterality: Right;  . HOLMIUM LASER APPLICATION  123456   Procedure: HOLMIUM LASER APPLICATION;  Surgeon: Bernestine Amass, MD;  Location[facility];  Service: Urology;  Laterality: Left;  . HOLMIUM LASER APPLICATION N/A 0000000   Procedure: HOLMIUM LASER APPLICATION;  Surgeon: Rana Snare, MD;  Location: [facility];  Service: Urology;  Laterality: N/A;  . KNEE SURGERY Bilateral [place]  . MELANOMA EXCISION Bilateral June 2016   20th-- left leg //  1st-- right leg w/ skin graft from right thigh (no lymph node bx)  . NEPHROLITHOTOMY Right 05/17/2014   Procedure: NEPHROLITHOTOMY PERCUTANEOUS;  Surgeon: Bernestine Amass, MD;  Location[facility];  Service: Urology;  Laterality: Right;  . PILONIDAL CYST EXCISION    . ROBOTIC ASSITED PARTIAL NEPHRECTOMY Left 06/18/2013   Procedure: ROBOTIC ASSISTED PARTIAL NEPHRECTOMY;  Surgeon: Dutch Gray, MD;  [facility];  Service: Urology;  Laterality: Left;  . ROTATOR CUFF REPAIR Left 07/2016  . TONSILLECTOMY  31  (age 52)  . TOTAL KNEE ARTHROPLASTY Left 02/07/2017   Procedure: LEFT TOTAL KNEE ARTHROPLASTY;  Surgeon: Leandrew Koyanagi, MD;  Location: [place];  Service: Orthopedics;  Laterality: Left;  . TOTAL KNEE  ARTHROPLASTY Right 04/25/2017   Procedure: RIGHT TOTAL KNEE ARTHROPLASTY;  Surgeon: Leandrew Koyanagi, MD;  Location: [place];  Service: Orthopedics;  Laterality: Right;  . TRANSURETHRAL RESECTION OF PROSTATE N/A 01/04/2017   Procedure: TRANSURETHRAL RESECTION OF THE PROSTATE (TURP);  Surgeon: Ardis Hughs, MD;  [facility];  Service: Urology;  Laterality: N/A;   Social History   Occupational History  . Occupation: Government social research officer, Psychologist, occupational  Tobacco Use  . Smoking status: Former Smoker    Packs/day: 1.00    Years: 6.00    Pack years: 6.00    Types: Cigarettes    Quit date: 10/31/1969    Years since quitting: 50.2  . Smokeless tobacco: Never Used  Substance and Sexual Activity  . Alcohol use: Yes    Alcohol/week: 0.0 standard drinks    Comment: OCCASIONAL  . Drug use: No  . Sexual activity: Never

## 2020-02-03 ENCOUNTER — Other Ambulatory Visit: Payer: Self-pay

## 2020-02-05 ENCOUNTER — Telehealth: Payer: Self-pay | Admitting: Endocrinology

## 2020-02-05 ENCOUNTER — Encounter: Payer: Self-pay | Admitting: Endocrinology

## 2020-02-05 ENCOUNTER — Other Ambulatory Visit: Payer: Self-pay

## 2020-02-05 ENCOUNTER — Ambulatory Visit: Payer: BC Managed Care – PPO | Admitting: Endocrinology

## 2020-02-05 ENCOUNTER — Telehealth: Payer: Self-pay

## 2020-02-05 VITALS — BP 172/80 | HR 75 | Ht 71.0 in | Wt 240.0 lb

## 2020-02-05 DIAGNOSIS — Z794 Long term (current) use of insulin: Secondary | ICD-10-CM | POA: Diagnosis not present

## 2020-02-05 DIAGNOSIS — E1159 Type 2 diabetes mellitus with other circulatory complications: Secondary | ICD-10-CM

## 2020-02-05 LAB — POCT GLYCOSYLATED HEMOGLOBIN (HGB A1C): Hemoglobin A1C: 7.3 % — AB (ref 4.0–5.6)

## 2020-02-05 MED ORDER — TRULICITY 3 MG/0.5ML ~~LOC~~ SOAJ: 3.0000 mg | SUBCUTANEOUS | 3 refills | Status: DC

## 2020-02-05 MED ORDER — PEN NEEDLES 30G X 8 MM MISC: 1.0000 | Freq: Every day | 0 refills | Status: DC

## 2020-02-05 MED ORDER — BASAGLAR KWIKPEN 100 UNIT/ML ~~LOC~~ SOPN: 75.0000 [IU] | PEN_INJECTOR | SUBCUTANEOUS | 3 refills | Status: DC

## 2020-02-05 MED ORDER — BASAGLAR KWIKPEN 100 UNIT/ML ~~LOC~~ SOPN: 75.0000 [IU] | PEN_INJECTOR | SUBCUTANEOUS | 0 refills | Status: DC

## 2020-02-05 NOTE — Telephone Encounter (Signed)
E-Prescribing Status: Receipt confirmed by pharmacy (02/05/2020 10:14 AM EDT)

## 2020-02-05 NOTE — Progress Notes (Signed)
Subjective:    Patient ID: Matthew Schmitt, male    DOB: 1951-01-03, 69 y.o.   MRN: [id number]  HPI Pt returns for f/u of diabetes mellitus: DM type: Insulin-requiring type 2. Dx'ed: 2002.   Complications: renal insuff, leg ulcers, and CAD.  Therapy: insulin since 123456 and Trulicity.   DKA: never.   Severe hypoglycemia: once, in 2017.  Pancreatitis: never.   Other: he declines multiple daily injections;.  In early 2017, he requested to change levemir to lantus, due to high dosage requirement; fructosamine has showed better glycemic control than A1c.     Interval history: pt says cbg varies from 130-219.  No recent steroids. He did not tolerate Farxiga (cramps).  Past Medical History:  Diagnosis Date  . Anxiety   . Arthritis    everywhere  . BPH associated with nocturia   . Coronary artery disease    cardiologist-  dr Bettina Gavia (previouly dr Wynonia Lawman)  . Essential hypertension, benign   . First degree heart block   . Heart murmur   . History of bladder stone   . History of kidney stones   . History of melanoma excision    June 2016  --- s/p  excision right  leg (per pt localized and no recurrence)  . History of renal cell carcinoma 01/2017   s/p  left partial nephrecotmy  . Impotence of organic origin   . Mixed hyperlipidemia    slightly  . Multiple renal cysts    bilateral  . PAF (paroxysmal atrial fibrillation) ([place])    followed by cardiology  (takes ASA)  . RBBB   . Right ureteral stone   . Type 2 diabetes mellitus treated with insulin [facility])    endocrinologist-  dr Loanne Drilling  . Wears glasses     Past Surgical History:  Procedure Laterality Date  . CARDIAC CATHETERIZATION  08-17-2011  DR SPENCER TILLEY   POSSIBLE MODERATE LAD STENOSIS JUST AFTER THE BIFURCATION OF LARGE DIAGONAL BRANCH/ LVEF  40-45%/ MILD GLOBAL HYPODINESIS (CATH DONE FOR ABNORMAL STRESS TEST OF FIXED LATERAL WALL DEFECT & BIFASCICULAR BLAOCK)  . CARDIOVERSION N/A 04/02/2013   Procedure: CARDIOVERSION;   Surgeon: Jacolyn Reedy, MD;  Location: [place];  Service: Cardiovascular;  Laterality: N/A;  . CYSTOSCOPY WITH LITHOLAPAXY N/A 05/18/2015   Procedure: CYSTOSCOPY WITH LITHOLAPAXY;  Surgeon: Rana Snare, MD;  [facility];  Service: Urology;  Laterality: N/A;  . CYSTOSCOPY WITH LITHOLAPAXY N/A 01/04/2017   Procedure: CYSTOSCOPY WITH LITHOLAPAXY, Holmium Laser;  Surgeon: Ardis Hughs, MD;  [facility];  Service: Urology;  Laterality: N/A;  . CYSTOSCOPY WITH RETROGRADE PYELOGRAM, URETEROSCOPY AND STENT PLACEMENT  11/03/2012   Procedure: CYSTOSCOPY WITH RETROGRADE PYELOGRAM, URETEROSCOPY AND STENT PLACEMENT;  Surgeon: Bernestine Amass, MD;  Location: [facility];  Service: Urology;  Laterality: Left;  Cystoscopy/Left Retrograde/Ureteroscopy/Holmium Laser Litho/Double J Stent  . CYSTOSCOPY WITH URETEROSCOPY, STONE BASKETRY AND STENT PLACEMENT Right 05/15/2019   Procedure: CYSTOSCOPY WITH URETEROSCOPY, LASER LITHOTRIPSY STONE BASKETRY AND STENT PLACEMENT, RIGHT RETROGRADE;  Surgeon: Ardis Hughs, MD;  Location[facility];  Service: Urology;  Laterality: Right;  . HOLMIUM LASER APPLICATION  123456   Procedure: HOLMIUM LASER APPLICATION;  Surgeon: Bernestine Amass, MD;  Location: [facility];  Service: Urology;  Laterality: Left;  . HOLMIUM LASER APPLICATION N/A 0000000   Procedure: HOLMIUM LASER APPLICATION;  Surgeon: Rana Snare, MD;  Location: [facility];  Service: Urology;  Laterality:  N/A;  . KNEE SURGERY Bilateral [place]  . MELANOMA EXCISION Bilateral June 2016   20th-- left leg //  1st-- right leg w/ skin graft from right thigh (no lymph node bx)  . NEPHROLITHOTOMY Right 05/17/2014   Procedure: NEPHROLITHOTOMY PERCUTANEOUS;  Surgeon: Bernestine Amass, MD;  [facility];  Service: Urology;  Laterality: Right;  . PILONIDAL CYST EXCISION    . ROBOTIC ASSITED PARTIAL NEPHRECTOMY Left  06/18/2013   Procedure: ROBOTIC ASSISTED PARTIAL NEPHRECTOMY;  Surgeon: Dutch Gray, MD;  [facility];  Service: Urology;  Laterality: Left;  . ROTATOR CUFF REPAIR Left 07/2016  . TONSILLECTOMY  63  (age 69)  . TOTAL KNEE ARTHROPLASTY Left 02/07/2017   Procedure: LEFT TOTAL KNEE ARTHROPLASTY;  Surgeon: Leandrew Koyanagi, MD;  Location: [place];  Service: Orthopedics;  Laterality: Left;  . TOTAL KNEE ARTHROPLASTY Right 04/25/2017   Procedure: RIGHT TOTAL KNEE ARTHROPLASTY;  Surgeon: Leandrew Koyanagi, MD;  Location: [place];  Service: Orthopedics;  Laterality: Right;  . TRANSURETHRAL RESECTION OF PROSTATE N/A 01/04/2017   Procedure: TRANSURETHRAL RESECTION OF THE PROSTATE (TURP);  Surgeon: Ardis Hughs, MD;  [facility];  Service: Urology;  Laterality: N/A;    Social History   Socioeconomic History  . Marital status: Married    Spouse name: Not on file  . Number of children: 2  . Years of education: 40  . Highest education level: Not on file  Occupational History  . Occupation: Government social research officer, Psychologist, occupational  Tobacco Use  . Smoking status: Former Smoker    Packs/day: 1.00    Years: 6.00    Pack years: 6.00    Types: Cigarettes    Quit date: 10/31/1969    Years since quitting: 50.3  . Smokeless tobacco: Never Used  Substance and Sexual Activity  . Alcohol use: Yes    Alcohol/week: 0.0 standard drinks    Comment: OCCASIONAL  . Drug use: No  . Sexual activity: Never  Other Topics Concern  . Not on file  Social History Narrative   Regular exercise-no   Social Determinants of Health   Financial Resource Strain:   . Difficulty of Paying Living Expenses:   Food Insecurity:   . Worried About Charity fundraiser in the Last Year:   . Arboriculturist in the Last Year:   Transportation Needs:   . Film/video editor (Medical):   Marland Kitchen Lack of Transportation (Non-Medical):   Physical Activity:   . Days of Exercise per Week:   . Minutes of Exercise per Session:   Stress:     . Feeling of Stress :   Social Connections:   . Frequency of Communication with Friends and Family:   . Frequency of Social Gatherings with Friends and Family:   . Attends Religious Services:   . Active Member of Clubs or Organizations:   . Attends Archivist Meetings:   Marland Kitchen Marital Status:   Intimate Partner Violence:   . Fear of Current or Ex-Partner:   . Emotionally Abused:   Marland Kitchen Physically Abused:   . Sexually Abused:     Current Outpatient Medications on File Prior to Visit  Medication Sig Dispense Refill  . amLODipine (NORVASC) 10 MG tablet Take 1 tablet (10 mg total) by mouth daily. (Patient taking differently: Take 10 mg by mouth daily. ) 90 tablet 0  . amLODIPine-Valsartan-HCTZ 10-320-25 MG TABS Take by mouth.    Marland Kitchen aspirin EC 81  MG tablet Take 81 mg by mouth daily.    Marland Kitchen glucose blood (ONE TOUCH ULTRA TEST) test strip 1 each by Other route 2 (two) times daily. And lancets 2/day                        . 180 each 3  . Menthol, Topical Analgesic, (BENGAY EX) Apply 1 application topically daily as needed (pain).    . sildenafil (VIAGRA) 100 MG tablet Take 100 mg by mouth daily as needed.  2  . carvedilol (COREG) 25 MG tablet Take 0.5 tablets (12.5 mg total) by mouth 2 (two) times daily with a meal. 180 tablet 1  . rosuvastatin (CRESTOR) 10 MG tablet Take 1 tablet (10 mg total) by mouth daily. 90 tablet 1   No current facility-administered medications on file prior to visit.    Allergies  Allergen Reactions  . Actos [Pioglitazone] Swelling    SWELLING REACTION UNSPECIFIED  EDEMA    Family History  Problem Relation Age of Onset  . Arthritis Father   . Hypertension Father   . Diabetes Father   . Arthritis Mother   . Hypertension Mother   . Heart attack Brother 63  . Diabetes Brother   . Lymphoma Paternal Grandfather   . Cancer Maternal Uncle   . Cancer Paternal Uncle        type unknown    BP (!) 172/80   Pulse 75   Ht 5\' 11"  (1.803 m)   Wt 240 lb (108.9  kg)   SpO2 99%   BMI 33.47 kg/m    Review of Systems He denies hypoglycemia    Objective:   Physical Exam VITAL SIGNS:  See vs page GENERAL: no distress Pulses: dorsalis pedis intact bilat.   MSK: no deformity of the feet CV: trace bilat leg edema Skin:  no ulcer on the feet, but there is an abrasion at the dorsal aspect of the left great toe, over the IP joint.  normal color and temp on the feet, but there is hyperpigmentation of the legs.   Neuro: sensation is intact to touch on the feet Ext: there is bilateral onychomycosis of the toenails.  Both great toenails are absent.    Lab Results  Component Value Date   HGBA1C 7.3 (A) 02/05/2020       Assessment & Plan:  HTN: is noted today Muscle cramps, apparently due to [place], new. Insulin-requiring type 2 DM: he would benefit from increased rx, if it can be done with a regimen that avoids or minimizes hypoglycemia.   Patient Instructions  Your blood pressure is high today.  Please see your primary care provider soon, to have it rechecked Keep the scrape on your toe covered with antibiotic ointment and large bandaids, until it heals.  Please call next week if it is not much better by then. Please stay off the Farxiga, and: Please continue the same insulin, and:  I have sent a prescription to your pharmacy, to double the Trulicity.  check your blood sugar twice a day.  vary the time of day when you check, between before the 3 meals, and at bedtime.  also check if you have symptoms of your blood sugar being too high or too low.  please keep a record of the readings and bring it to your next appointment here (or you can bring the meter itself).  You can write it on any piece of paper.  please call us sooner  if your blood sugar goes below 70, or if you have a lot of readings over 200. Please come back for a follow-up appointment in 2 months.

## 2020-02-05 NOTE — Patient Instructions (Addendum)
Your blood pressure is high today.  Please see your primary care provider soon, to have it rechecked Keep the scrape on your toe covered with antibiotic ointment and large bandaids, until it heals.  Please call next week if it is not much better by then. Please stay off the Farxiga, and: Please continue the same insulin, and:  I have sent a prescription to your pharmacy, to double the Trulicity.  check your blood sugar twice a day.  vary the time of day when you check, between before the 3 meals, and at bedtime.  also check if you have symptoms of your blood sugar being too high or too low.  please keep a record of the readings and bring it to your next appointment here (or you can bring the meter itself).  You can write it on any piece of paper.  please call us sooner if your blood sugar goes below 70, or if you have a lot of readings over 200. Please come back for a follow-up appointment in 2 months.

## 2020-02-05 NOTE — Telephone Encounter (Signed)
Patient states that the Rx for the medications listed below were sent to the wrong PHARM. Patient gets RX's that are for 90 days sent to [facility]. Patient requests the following:  MEDICATION: Dulaglutide (TRULICITY) 3 0000000 SOPN AND Insulin Glargine (BASAGLAR KWIKPEN) 100 UNIT/ML  PHARMACY:   [place], [place] Phone:  [phone]  Fax:  [phone]      IS THIS A 90 DAY SUPPLY : Yes  IS PATIENT OUT OF MEDICATION: No  IF NOT; HOW MUCH IS LEFT: Approx. 1 month each  LAST APPOINTMENT DATE: @3 /19/2021  NEXT APPOINTMENT DATE:@6 /02/2020  DO WE HAVE YOUR PERMISSION TO LEAVE A DETAILED MESSAGE: Yes  OTHER COMMENTS:    **Let patient know to contact pharmacy at the end of the day to make sure medication is ready. **  ** Please notify patient to allow 48-72 hours to process**  **Encourage patient to contact the pharmacy for refills or they can request refills through [facility]**

## 2020-02-05 NOTE — Telephone Encounter (Signed)
PRIOR AUTHORIZATION  PA initiation date: 02/05/20  Medication: [employer]: [employer] completed electronically through [employer] My Meds: Yes  Will await insurance response re: approval/denial.  APPROVAL  Medication: [employer]: [facility] PA response: APPROVED  Documents have been labeled and placed in scan file for HIM and for our future reference.  Marily Lente Key: [id number] - PA Case ID: [id number] - Rx #: [id number] Need help? Call us at [phone] Outcome Approvedtoday CaseId:[id number];Status:Approved;Review Type:Prior Auth;Coverage Start Date:01/06/2020;Coverage End [id number]; Drug Trulicity 3MG /0.5ML pen-injectors Form [facility] Electronic PA Form [phone] NCPDP) Original Claim Info 56

## 2020-03-07 ENCOUNTER — Ambulatory Visit (HOSPITAL_COMMUNITY)
Admission: EM | Admit: 2020-03-07 | Discharge: 2020-03-07 | Disposition: A | Discharge status: Home / Self Care | Payer: BC Managed Care – PPO | Attending: Internal Medicine | Admitting: Internal Medicine

## 2020-03-07 ENCOUNTER — Other Ambulatory Visit: Payer: Self-pay

## 2020-03-07 ENCOUNTER — Encounter (HOSPITAL_COMMUNITY): Payer: Self-pay

## 2020-03-07 DIAGNOSIS — H1031 Unspecified acute conjunctivitis, right eye: Secondary | ICD-10-CM | POA: Diagnosis not present

## 2020-03-07 MED ORDER — TETRACAINE HCL 0.5 % OP SOLN
OPHTHALMIC | Status: AC
  Filled 2020-03-07: qty 4

## 2020-03-07 MED ORDER — ERYTHROMYCIN 5 MG/GM OP OINT: TOPICAL_OINTMENT | Freq: Every day | OPHTHALMIC | 0 refills | Status: AC

## 2020-03-07 MED ORDER — FLUORESCEIN SODIUM 1 MG OP STRP
ORAL_STRIP | OPHTHALMIC | Status: AC
  Filled 2020-03-07: qty 1

## 2020-03-07 NOTE — ED Triage Notes (Signed)
Pt states he has right eye pain. Pt states he was mowing the grass a week ago and the next day after mowing the grass he says he felt something in his eye.

## 2020-03-08 NOTE — ED Provider Notes (Addendum)
[place]    CSN: [id number] Arrival date & time: 03/07/20  [id number]      History   Chief Complaint Chief Complaint  Patient presents with  . Eye Pain    HPI Matthew Schmitt is a 69 y.o. male comes to urgent care with right eye pain. Symptoms started a week ago a day after he mowed his grass. Patient felt something in his eye. He subsequently noticed redness in his eye. No blurry vision. No double vision. No discharge from the eye. Patient has some pain in the eye as the visit to the urgent care for evaluation. Past Medical History:  Diagnosis Date  . Anxiety   . Arthritis    everywhere  . BPH associated with nocturia   . Coronary artery disease    cardiologist-  dr Bettina Gavia (previouly dr Wynonia Lawman)  . Essential hypertension, benign   . First degree heart block   . Heart murmur   . History of bladder stone   . History of kidney stones   . History of melanoma excision    June 2016  --- s/p  excision right  leg (per pt localized and no recurrence)  . History of renal cell carcinoma 01/2017   s/p  left partial nephrecotmy  . Impotence of organic origin   . Mixed hyperlipidemia    slightly  . Multiple renal cysts    bilateral  . PAF (paroxysmal atrial fibrillation) ([place])    followed by cardiology  (takes ASA)  . RBBB   . Right ureteral stone   . Type 2 diabetes mellitus treated with insulin [facility])    endocrinologist-  dr Loanne Drilling  . Wears glasses     Patient Active Problem List   Diagnosis Date Noted  . Tendinopathy of right rotator cuff 01/29/2020  . Tendinopathy of right biceps tendon 01/29/2020  . Impingement syndrome of right shoulder 01/29/2020  . Arthrosis of right acromioclavicular joint 01/29/2020  . Chronic right shoulder pain 01/01/2020  . Status post total left knee replacement 01/01/2020  . Left-sided low back pain with left-sided sciatica 01/23/2019  . Diabetes ([place]) 01/18/2018  . History of bilateral knee replacement 02/07/2017  . Primary  osteoarthritis of left knee   . BPH with obstruction/lower urinary tract symptoms 01/04/2017  . Rotator cuff syndrome of left shoulder 04/17/2016  . Nephrolithiasis 05/17/2014  . Encounter for long-term (current) use of other medications 07/27/2013  . Long term current use of anticoagulant therapy   . CAD (coronary artery disease) 04/02/2013  . Hypertensive heart disease 04/02/2013  . 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 Atrial flutter ([place])   . Nonischemic cardiomyopathy ([place])   . Obesity (BMI 30-39.9)   . History of kidney stones   . Osteoarthritis   . Hyperlipidemia     Past Surgical History:  Procedure Laterality Date  . CARDIAC CATHETERIZATION  08-17-2011  DR SPENCER TILLEY   POSSIBLE MODERATE LAD STENOSIS JUST AFTER THE BIFURCATION OF LARGE DIAGONAL BRANCH/ LVEF  40-45%/ MILD GLOBAL HYPODINESIS (CATH DONE FOR ABNORMAL STRESS TEST OF FIXED LATERAL WALL DEFECT & BIFASCICULAR BLAOCK)  . CARDIOVERSION N/A 04/02/2013   Procedure: CARDIOVERSION;  Surgeon: Jacolyn Reedy, MD;  Location: [place];  Service: Cardiovascular;  Laterality: N/A;  . CYSTOSCOPY WITH LITHOLAPAXY N/A 05/18/2015   Procedure: CYSTOSCOPY WITH LITHOLAPAXY;  Surgeon: Rana Snare, MD;  Location: [facility];  Service: Urology;  Laterality: N/A;  . CYSTOSCOPY WITH LITHOLAPAXY N/A 01/04/2017  Procedure: CYSTOSCOPY WITH LITHOLAPAXY, Holmium Laser;  Surgeon: Ardis Hughs, MD;  [facility];  Service: Urology;  Laterality: N/A;  . CYSTOSCOPY WITH RETROGRADE PYELOGRAM, URETEROSCOPY AND STENT PLACEMENT  11/03/2012   Procedure: CYSTOSCOPY WITH RETROGRADE PYELOGRAM, URETEROSCOPY AND STENT PLACEMENT;  Surgeon: Bernestine Amass, MD;  [facility];  Service: Urology;  Laterality: Left;  Cystoscopy/Left Retrograde/Ureteroscopy/Holmium Laser Litho/Double J Stent  . CYSTOSCOPY WITH URETEROSCOPY, STONE BASKETRY AND STENT PLACEMENT Right 05/15/2019     Procedure: CYSTOSCOPY WITH URETEROSCOPY, LASER LITHOTRIPSY STONE BASKETRY AND STENT PLACEMENT, RIGHT RETROGRADE;  Surgeon: Ardis Hughs, MD;  Location: [facility];  Service: Urology;  Laterality: Right;  . HOLMIUM LASER APPLICATION  [id number]   Procedure: HOLMIUM LASER APPLICATION;  Surgeon: Bernestine Amass, MD;  Location: [facility];  Service: Urology;  Laterality: Left;  . HOLMIUM LASER APPLICATION N/A 0000000   Procedure: HOLMIUM LASER APPLICATION;  Surgeon: Rana Snare, MD;  Location: [facility];  Service: Urology;  Laterality: N/A;  . KNEE SURGERY Bilateral [place]  . MELANOMA EXCISION Bilateral June 2016   20th-- left leg //  1st-- right leg w/ skin graft from right thigh (no lymph node bx)  . NEPHROLITHOTOMY Right 05/17/2014   Procedure: NEPHROLITHOTOMY PERCUTANEOUS;  Surgeon: Bernestine Amass, MD;  [facility];  Service: Urology;  Laterality: Right;  . PILONIDAL CYST EXCISION    . ROBOTIC ASSITED PARTIAL NEPHRECTOMY Left 06/18/2013   Procedure: ROBOTIC ASSISTED PARTIAL NEPHRECTOMY;  Surgeon: [place] Gray, MD;  [facility];  Service: Urology;  Laterality: Left;  . ROTATOR CUFF REPAIR Left 07/2016  . TONSILLECTOMY  22  (age 76)  . TOTAL KNEE ARTHROPLASTY Left 02/07/2017   Procedure: LEFT TOTAL KNEE ARTHROPLASTY;  Surgeon: Leandrew Koyanagi, MD;  Location: [place];  Service: Orthopedics;  Laterality: Left;  . TOTAL KNEE ARTHROPLASTY Right 04/25/2017   Procedure: RIGHT TOTAL KNEE ARTHROPLASTY;  Surgeon: Leandrew Koyanagi, MD;  Location: [place];  Service: Orthopedics;  Laterality: Right;  . TRANSURETHRAL RESECTION OF PROSTATE N/A 01/04/2017   Procedure: TRANSURETHRAL RESECTION OF THE PROSTATE (TURP);  Surgeon: Ardis Hughs, MD;  [facility];  Service: Urology;  Laterality: N/A;       Home Medications    Prior to Admission medications   Medication Sig Start Date End Date Taking? Authorizing Provider  amLODipine  (NORVASC) 10 MG tablet Take 1 tablet (10 mg total) by mouth daily. Patient taking differently: Take 10 mg by mouth daily.  11/14/18   Richardo Priest, MD  amLODIPine-Valsartan-HCTZ [phone] MG TABS Take by mouth.    [provider]  aspirin EC 81 MG tablet Take 81 mg by mouth daily.    [provider]  carvedilol (COREG) 25 MG tablet Take 0.5 tablets (12.5 mg total) by mouth 2 (two) times daily with a meal. 11/06/19 02/04/20  Munley, Hilton Cork, MD  Dulaglutide (TRULICITY) 3 0000000 SOPN Inject 3 mg into the skin once a week. 02/05/20   Renato Shin, MD  erythromycin ophthalmic ointment Place into the right eye at bedtime for 7 days. Place a 1/2 inch ribbon of ointment into the lower eyelid. 03/07/20 03/14/20  Javone Ybanez, Myrene Galas, MD  glucose blood (ONE TOUCH ULTRA TEST) test strip 1 each by Other route 2 (two) times daily. And lancets 2/day                        .  06/05/19   Renato Shin, MD  Insulin Glargine [facility]) 100 UNIT/ML Inject 0.75 mLs (75 Units total) into the skin every morning. E11.9 02/05/20   Renato Shin, MD  Insulin Pen Needle (PEN NEEDLES) 30G X 8 MM MISC 1 each by Does not apply route daily. E11.9 02/05/20   Renato Shin, MD  Menthol, Topical Analgesic, (BENGAY EX) Apply 1 application topically daily as needed (pain).    [provider]  rosuvastatin (CRESTOR) 10 MG tablet Take 1 tablet (10 mg total) by mouth daily. 11/06/19 02/04/20  Richardo Priest, MD  sildenafil (VIAGRA) 100 MG tablet Take 100 mg by mouth daily as needed. 05/30/18   [provider]    Family History Family History  Problem Relation Age of Onset  . Arthritis Father   . Hypertension Father   . Diabetes Father   . Arthritis Mother   . Hypertension Mother   . Heart attack Brother 59  . Diabetes Brother   . Lymphoma Paternal Grandfather   . Cancer Maternal Uncle   . Cancer Paternal Uncle        type unknown    Social History Social History   Tobacco Use  .  Smoking status: Former Smoker    Packs/day: 1.00    Years: 6.00    Pack years: 6.00    Types: Cigarettes    Quit date: 10/31/1969    Years since quitting: 50.3  . Smokeless tobacco: Never Used  Substance Use Topics  . Alcohol use: Yes    Alcohol/week: 0.0 standard drinks    Comment: OCCASIONAL  . Drug use: No     Allergies   Actos [pioglitazone]   Review of Systems Review of Systems  Constitutional: Negative.   Eyes: Positive for pain and redness. Negative for photophobia, discharge and visual disturbance.  Respiratory: Negative.   Gastrointestinal: Negative.   Genitourinary: Negative.   Neurological: Negative.      Physical Exam Triage Vital Signs ED Triage Vitals [03/07/20 0835]  Enc Vitals Group     BP (!) 188/79     Pulse Rate 80     Resp 16     Temp 98.7 F (37.1 C)     Temp Source Oral     SpO2 99 %     Weight 240 lb (108.9 kg)     Height      Head Circumference      Peak Flow      Pain Score 1     Pain Loc      Pain Edu?      Excl. in [place]?    No data found.  Updated Vital Signs BP (!) 188/79 (BP Location: Left Arm)   Pulse 80   Temp 98.7 F (37.1 C) (Oral)   Resp 16   Wt 108.9 kg   SpO2 99%   BMI 33.47 kg/m   Visual Acuity Right Eye Distance:   Left Eye Distance:   Bilateral Distance:    Right Eye Near: R Near: 20/30 Left Eye Near:  L Near: 20/30 Bilateral Near:  20/30  Physical Exam Vitals and nursing note reviewed.  Constitutional:      General: He is not in acute distress.    Appearance: He is not ill-appearing.  HENT:     Nose: Nose normal.     Mouth/Throat:     Mouth: Mucous membranes are moist.     Pharynx: No oropharyngeal exudate or posterior oropharyngeal erythema.  Eyes:  Pupils: Pupils are equal, round, and reactive to light.     Comments: Right eye conjunctival erythema. No swelling.  Pulmonary:     Effort: Pulmonary effort is normal.     Breath sounds: Normal breath sounds.  Abdominal:     General: Bowel  sounds are normal.     Palpations: Abdomen is soft.  Skin:    Capillary Refill: Capillary refill takes less than 2 seconds.  Neurological:     Mental Status: He is alert.      UC Treatments / Results  Labs (all labs ordered are listed, but only abnormal results are displayed) Labs Reviewed - No data to display  EKG   Radiology No results found.  Procedures Procedures (including critical care time)  Medications Ordered in UC Medications - No data to display  Initial Impression / Assessment and Plan / UC Course  I have reviewed the triage vital signs and the nursing notes.  Pertinent labs & imaging results that were available during my care of the patient were reviewed by me and considered in my medical decision making (see chart for details).     1. Acute conjunctivitis: Staining negative for corneal abrasion plan erythromycin 0.7 days If patient symptoms worsens he is advised to go to his ophthalmologist for further evaluation. Discussed the patient with Dr. Katy Fitch and formulated the plan of care accordingly. Final Clinical Impressions(s) / UC Diagnoses   Final diagnoses:  Acute conjunctivitis of right eye, unspecified acute conjunctivitis type   Discharge Instructions   None    ED Prescriptions    Medication Sig Dispense Auth. Provider   erythromycin ophthalmic ointment Place into the right eye at bedtime for 7 days. Place a 1/2 inch ribbon of ointment into the lower eyelid. 3.5 g Kimberlie Csaszar, Myrene Galas, MD     PDMP not reviewed this encounter.   Chase Picket, MD 03/08/20 1449    Chase Picket, MD 03/08/20 1450

## 2020-04-19 DIAGNOSIS — R310 Gross hematuria: Secondary | ICD-10-CM | POA: Diagnosis not present

## 2020-04-19 DIAGNOSIS — N200 Calculus of kidney: Secondary | ICD-10-CM | POA: Diagnosis not present

## 2020-04-19 DIAGNOSIS — N5201 Erectile dysfunction due to arterial insufficiency: Secondary | ICD-10-CM | POA: Diagnosis not present

## 2020-04-20 ENCOUNTER — Other Ambulatory Visit: Payer: Self-pay

## 2020-04-22 ENCOUNTER — Other Ambulatory Visit: Payer: Self-pay

## 2020-04-22 ENCOUNTER — Encounter: Payer: Self-pay | Admitting: Endocrinology

## 2020-04-22 ENCOUNTER — Ambulatory Visit: Payer: BC Managed Care – PPO | Admitting: Endocrinology

## 2020-04-22 ENCOUNTER — Telehealth: Payer: Self-pay | Admitting: Endocrinology

## 2020-04-22 VITALS — BP 132/70 | HR 76 | Ht 71.0 in | Wt 238.8 lb

## 2020-04-22 DIAGNOSIS — Z794 Long term (current) use of insulin: Secondary | ICD-10-CM | POA: Diagnosis not present

## 2020-04-22 DIAGNOSIS — E1159 Type 2 diabetes mellitus with other circulatory complications: Secondary | ICD-10-CM

## 2020-04-22 LAB — POCT GLYCOSYLATED HEMOGLOBIN (HGB A1C): Hemoglobin A1C: 6.9 % — AB (ref 4.0–5.6)

## 2020-04-22 MED ORDER — BASAGLAR KWIKPEN 100 UNIT/ML ~~LOC~~ SOPN: 60.0000 [IU] | PEN_INJECTOR | SUBCUTANEOUS | 1 refills | Status: DC

## 2020-04-22 MED ORDER — TRULICITY 4.5 MG/0.5ML ~~LOC~~ SOAJ: 4.5000 mg | SUBCUTANEOUS | 3 refills | Status: DC

## 2020-04-22 MED ORDER — BASAGLAR KWIKPEN 100 UNIT/ML ~~LOC~~ SOPN: 60.0000 [IU] | PEN_INJECTOR | SUBCUTANEOUS | 0 refills | Status: DC

## 2020-04-22 NOTE — Telephone Encounter (Signed)
RX have been sent to [facility].

## 2020-04-22 NOTE — Progress Notes (Signed)
Subjective:    Patient ID: Matthew Schmitt, male    DOB: Apr 23, 1951, 69 y.o.   MRN: [id number]  HPI Pt returns for f/u of diabetes mellitus: DM type: Insulin-requiring type 2. Dx'ed: 2002.   Complications: renal insuff, leg ulcers, and CAD.  Therapy: insulin since 3570 and Trulicity.   DKA: never.   Severe hypoglycemia: once, in 2017.  Pancreatitis: never.   Other: he declines multiple daily injections;.  In early 2017, he requested to change levemir to lantus, due to high dosage requirement; fructosamine has showed better glycemic control than A1c; He did not tolerate Farxiga (cramps).  Interval history: He brings a record of his fasting cbg's which I have reviewed today.  they from 132-197.  No recent steroids. He takes aleve for back pain.   Past Medical History:  Diagnosis Date  . Anxiety   . Arthritis    everywhere  . BPH associated with nocturia   . Coronary artery disease    cardiologist-  dr Bettina Gavia (previouly dr Wynonia Lawman)  . Essential hypertension, benign   . First degree heart block   . Heart murmur   . History of bladder stone   . History of kidney stones   . History of melanoma excision    June 2016  --- s/p  excision right  leg (per pt localized and no recurrence)  . History of renal cell carcinoma 01/2017   s/p  left partial nephrecotmy  . Impotence of organic origin   . Mixed hyperlipidemia    slightly  . Multiple renal cysts    bilateral  . PAF (paroxysmal atrial fibrillation) ([place])    followed by cardiology  (takes ASA)  . RBBB   . Right ureteral stone   . Type 2 diabetes mellitus treated with insulin [facility])    endocrinologist-  dr Loanne Drilling  . Wears glasses     Past Surgical History:  Procedure Laterality Date  . CARDIAC CATHETERIZATION  08-17-2011  DR SPENCER TILLEY   POSSIBLE MODERATE LAD STENOSIS JUST AFTER THE BIFURCATION OF LARGE DIAGONAL BRANCH/ LVEF  40-45%/ MILD GLOBAL HYPODINESIS (CATH DONE FOR ABNORMAL STRESS TEST OF FIXED LATERAL WALL DEFECT &  BIFASCICULAR BLAOCK)  . CARDIOVERSION N/A 04/02/2013   Procedure: CARDIOVERSION;  Surgeon: Jacolyn Reedy, MD;  Location: [place];  Service: Cardiovascular;  Laterality: N/A;  . CYSTOSCOPY WITH LITHOLAPAXY N/A 05/18/2015   Procedure: CYSTOSCOPY WITH LITHOLAPAXY;  Surgeon: Rana Snare, MD;  Location: [facility];  Service: Urology;  Laterality: N/A;  . CYSTOSCOPY WITH LITHOLAPAXY N/A 01/04/2017   Procedure: CYSTOSCOPY WITH LITHOLAPAXY, Holmium Laser;  Surgeon: Ardis Hughs, MD;  [facility];  Service: Urology;  Laterality: N/A;  . CYSTOSCOPY WITH RETROGRADE PYELOGRAM, URETEROSCOPY AND STENT PLACEMENT  11/03/2012   Procedure: CYSTOSCOPY WITH RETROGRADE PYELOGRAM, URETEROSCOPY AND STENT PLACEMENT;  Surgeon: Bernestine Amass, MD;  Location: [facility];  Service: Urology;  Laterality: Left;  Cystoscopy/Left Retrograde/Ureteroscopy/Holmium Laser Litho/Double J Stent  . CYSTOSCOPY WITH URETEROSCOPY, STONE BASKETRY AND STENT PLACEMENT Right 05/15/2019   Procedure: CYSTOSCOPY WITH URETEROSCOPY, LASER LITHOTRIPSY STONE BASKETRY AND STENT PLACEMENT, RIGHT RETROGRADE;  Surgeon: Ardis Hughs, MD;  Location: [facility];  Service: Urology;  Laterality: Right;  . HOLMIUM LASER APPLICATION  17/79/3903   Procedure: HOLMIUM LASER APPLICATION;  Surgeon: Bernestine Amass, MD;  Location: [facility];  Service: Urology;  Laterality: Left;  . HOLMIUM LASER APPLICATION N/A 0/07/2329   Procedure: HOLMIUM LASER APPLICATION;  Surgeon: Rana Snare, MD;  Location: [facility];  Service: Urology;  Laterality: N/A;  . KNEE SURGERY Bilateral [place]  . MELANOMA EXCISION Bilateral June 2016   20th-- left leg //  1st-- right leg w/ skin graft from right thigh (no lymph node bx)  . NEPHROLITHOTOMY Right 05/17/2014   Procedure: NEPHROLITHOTOMY PERCUTANEOUS;  Surgeon: Bernestine Amass, MD;  [facility];  Service: Urology;  Laterality:  Right;  . PILONIDAL CYST EXCISION    . ROBOTIC ASSITED PARTIAL NEPHRECTOMY Left 06/18/2013   Procedure: ROBOTIC ASSISTED PARTIAL NEPHRECTOMY;  Surgeon: [place] Gray, MD;  [facility];  Service: Urology;  Laterality: Left;  . ROTATOR CUFF REPAIR Left 07/2016  . TONSILLECTOMY  57  (age 37)  . TOTAL KNEE ARTHROPLASTY Left 02/07/2017   Procedure: LEFT TOTAL KNEE ARTHROPLASTY;  Surgeon: Leandrew Koyanagi, MD;  Location: [place];  Service: Orthopedics;  Laterality: Left;  . TOTAL KNEE ARTHROPLASTY Right 04/25/2017   Procedure: RIGHT TOTAL KNEE ARTHROPLASTY;  Surgeon: Leandrew Koyanagi, MD;  Location: [place];  Service: Orthopedics;  Laterality: Right;  . TRANSURETHRAL RESECTION OF PROSTATE N/A 01/04/2017   Procedure: TRANSURETHRAL RESECTION OF THE PROSTATE (TURP);  Surgeon: Ardis Hughs, MD;  [facility];  Service: Urology;  Laterality: N/A;    Social History   Socioeconomic History  . Marital status: Married    Spouse name: Not on file  . Number of children: 2  . Years of education: 6  . Highest education level: Not on file  Occupational History  . Occupation: Government social research officer, Psychologist, occupational  Tobacco Use  . Smoking status: Former Smoker    Packs/day: 1.00    Years: 6.00    Pack years: 6.00    Types: Cigarettes    Quit date: 10/31/1969    Years since quitting: 50.5  . Smokeless tobacco: Never Used  Substance and Sexual Activity  . Alcohol use: Yes    Alcohol/week: 0.0 standard drinks    Comment: OCCASIONAL  . Drug use: No  . Sexual activity: Never  Other Topics Concern  . Not on file  Social History Narrative   Regular exercise-no   Social Determinants of Health   Financial Resource Strain:   . Difficulty of Paying Living Expenses:   Food Insecurity:   . Worried About Charity fundraiser in the Last Year:   . Arboriculturist in the Last Year:   Transportation Needs:   . Film/video editor (Medical):   Marland Kitchen Lack of Transportation (Non-Medical):   Physical Activity:     . Days of Exercise per Week:   . Minutes of Exercise per Session:   Stress:   . Feeling of Stress :   Social Connections:   . Frequency of Communication with Friends and Family:   . Frequency of Social Gatherings with Friends and Family:   . Attends Religious Services:   . Active Member of Clubs or Organizations:   . Attends Archivist Meetings:   Marland Kitchen Marital Status:   Intimate Partner Violence:   . Fear of Current or Ex-Partner:   . Emotionally Abused:   Marland Kitchen Physically Abused:   . Sexually Abused:     Current Outpatient Medications on File Prior to Visit  Medication Sig Dispense Refill  . amLODipine (NORVASC) 10 MG tablet Take 1 tablet (10 mg total) by mouth daily. (Patient taking differently: Take 10 mg by mouth daily. ) 90 tablet 0  .  aspirin EC 81 MG tablet Take 81 mg by mouth daily.    Marland Kitchen glucose blood (ONE TOUCH ULTRA TEST) test strip 1 each by Other route 2 (two) times daily. And lancets 2/day                        . 180 each 3  . Insulin Pen Needle (PEN NEEDLES) 30G X 8 MM MISC 1 each by Does not apply route daily. E11.9 90 each 0  . Menthol, Topical Analgesic, (BENGAY EX) Apply 1 application topically daily as needed (pain).    . valsartan-hydrochlorothiazide (DIOVAN-HCT) 320-25 MG tablet Take 1 tablet by mouth daily.    Marland Kitchen amLODIPine-Valsartan-HCTZ 10-320-25 MG TABS Take by mouth.    . carvedilol (COREG) 25 MG tablet Take 0.5 tablets (12.5 mg total) by mouth 2 (two) times daily with a meal. 180 tablet 1  . rosuvastatin (CRESTOR) 10 MG tablet Take 1 tablet (10 mg total) by mouth daily. 90 tablet 1  . sildenafil (VIAGRA) 100 MG tablet Take 100 mg by mouth daily as needed.  2   No current facility-administered medications on file prior to visit.    Allergies  Allergen Reactions  . Actos [Pioglitazone] Swelling    SWELLING REACTION UNSPECIFIED  EDEMA    Family History  Problem Relation Age of Onset  . Arthritis Father   . Hypertension Father   . Diabetes  Father   . Arthritis Mother   . Hypertension Mother   . Heart attack Brother 77  . Diabetes Brother   . Lymphoma Paternal Grandfather   . Cancer Maternal Uncle   . Cancer Paternal Uncle        type unknown    BP 132/70 (BP Location: Left Arm, Patient Position: Sitting, Cuff Size: Large)   Pulse 76   Ht 5\' 11"  (1.803 m)   Wt 238 lb 12.8 oz (108.3 kg)   SpO2 97%   BMI 33.31 kg/m    Review of Systems He denies hypoglycemia and nausea.      Objective:   Physical Exam VITAL SIGNS:  See vs page GENERAL: no distress Pulses: dorsalis pedis intact bilat.   MSK: no deformity of the feet CV: trace bilat leg edema Skin:  no ulcer on the feet.  normal color and temp on the feet, but there is hyperpigmentation of the legs.   Neuro: sensation is intact to touch on the feet Ext: there is bilateral onychomycosis of the toenails.  Both great toenails are absent.    Lab Results  Component Value Date   HGBA1C 6.9 (A) 04/22/2020    Lab Results  Component Value Date   CREATININE 1.29 (H) 01/01/2020   BUN 22 01/01/2020   NA 146 (H) 01/01/2020   K 4.2 01/01/2020   CL 103 01/01/2020   CO2 25 01/01/2020        Assessment & Plan:  Insulin-requiring type 2 DM, with CAD: overcontrolled, given this regimen, which does match insulin to his changing needs throughout the day. CRI: he should avoid NSAID  Patient Instructions  You should avoid Aleve, as it is not good for your kidneys.   Please reduce the insulin to 60 units each morning, and:  I have sent a prescription to your pharmacy, to increase the Trulicity.  You can use up the current shots by taking 1 shot every 5 days.  check your blood sugar twice a day.  vary the time of day when you check,  between before the 3 meals, and at bedtime.  also check if you have symptoms of your blood sugar being too high or too low.  please keep a record of the readings and bring it to your next appointment here (or you can bring the meter itself).   You can write it on any piece of paper.  please call us sooner if your blood sugar goes below 70, or if you have a lot of readings over 200. Please come back for a follow-up appointment in 3 months.

## 2020-04-22 NOTE — Telephone Encounter (Signed)
Patient called and advised that the Trulicity and Basaglar need to be sent to [facility].  He also advises that [facility] is his pharmacy of choice for medications unless the medication prescribed for him would need to be picked up same day  Any questions or clarification - [phone]

## 2020-04-22 NOTE — Patient Instructions (Addendum)
You should avoid Aleve, as it is not good for your kidneys.   Please reduce the insulin to 60 units each morning, and:  I have sent a prescription to your pharmacy, to increase the Trulicity.  You can use up the current shots by taking 1 shot every 5 days.  check your blood sugar twice a day.  vary the time of day when you check, between before the 3 meals, and at bedtime.  also check if you have symptoms of your blood sugar being too high or too low.  please keep a record of the readings and bring it to your next appointment here (or you can bring the meter itself).  You can write it on any piece of paper.  please call us sooner if your blood sugar goes below 70, or if you have a lot of readings over 200. Please come back for a follow-up appointment in 3 months.

## 2020-04-28 ENCOUNTER — Other Ambulatory Visit: Payer: Self-pay

## 2020-04-28 ENCOUNTER — Encounter (HOSPITAL_BASED_OUTPATIENT_CLINIC_OR_DEPARTMENT_OTHER): Payer: Self-pay | Admitting: Orthopaedic Surgery

## 2020-04-29 ENCOUNTER — Telehealth: Payer: Self-pay

## 2020-04-29 ENCOUNTER — Other Ambulatory Visit: Payer: Self-pay | Admitting: Family

## 2020-04-29 ENCOUNTER — Other Ambulatory Visit: Payer: Self-pay

## 2020-04-29 NOTE — Telephone Encounter (Signed)
Called pt and notified that he will need an appt for cardiac clearance. Appt scheduled with Dr Bettina Gavia 05-20-2019 for cardiac clearance

## 2020-04-29 NOTE — Telephone Encounter (Signed)
° °  [facility] Pre-operative Risk Assessment    HEARTCARE STAFF: - Please ensure there is not already an duplicate clearance open for this procedure. - Under Visit Info/Reason for Call, type in Other and utilize the format Clearance MM/DD/YY or Clearance TBD. Do not use dashes or single digits. - If request is for dental extraction, please clarify the # of teeth to be extracted.  Request for surgical clearance:  1. What type of surgery is being performed? Right Shoulder Arthroscopy   2. When is this surgery scheduled? TBD   3. What type of clearance is required (medical clearance vs. Pharmacy clearance to hold med vs. Both)? Medical/Pharmacy  4. Are there any medications that need to be held prior to surgery and how long?  Blood thinner, time to hold is unspecified.   5. Practice name and name of physician performing surgery? [facility]   6. What is the office phone number? [phone]   7.   What is the office fax number? [phone]  8.   Anesthesia type (None, local, MAC, general) ? General+Block   Gita Kudo 04/29/2020, 1:41 PM  _________________________________________________________________   (provider comments below)

## 2020-04-29 NOTE — Telephone Encounter (Signed)
Primary Cardiologist:Brian Bettina Gavia, MD  Chart reviewed as part of pre-operative protocol coverage. Because of Matthew Schmitt's past medical history and time since last visit, he/she will require a follow-up visit in order to better assess preoperative cardiovascular risk.  Pre-op covering staff: - Please schedule appointment and call patient to inform them. - Please contact requesting surgeon's office via preferred method (i.e, phone, fax) to inform them of need for appointment prior to surgery.  If applicable, this message will also be routed to pharmacy pool and/or primary cardiologist for input on holding anticoagulant/antiplatelet agent as requested below so that this information is available at time of patient's appointment.   Deberah Pelton, NP  04/29/2020, 1:52 PM

## 2020-05-03 ENCOUNTER — Other Ambulatory Visit (HOSPITAL_COMMUNITY): Payer: BC Managed Care – PPO

## 2020-05-13 ENCOUNTER — Inpatient Hospital Stay: Payer: BC Managed Care – PPO | Admitting: Orthopaedic Surgery

## 2020-05-17 DIAGNOSIS — R310 Gross hematuria: Secondary | ICD-10-CM | POA: Diagnosis not present

## 2020-05-17 DIAGNOSIS — N200 Calculus of kidney: Secondary | ICD-10-CM | POA: Diagnosis not present

## 2020-05-18 NOTE — Progress Notes (Signed)
Cardiology Office Note:    Date:  05/19/2020   ID:  Matthew Schmitt, DOB [birth year], MRN [id number]  PCP:  Leonides Sake, MD  Cardiologist:  Shirlee More, MD    Referring MD: Alonna Buckler*    ASSESSMENT:    1. Preoperative cardiovascular examination   2. Coronary artery disease of native artery of native heart with stable angina pectoris ([place])   3. Hypertensive heart disease without heart failure   4. PAF (paroxysmal atrial fibrillation) ([place])   5. Hyperlipidemia   6. RBBB (right bundle branch block with left anterior fascicular block)    PLAN:    In order of problems listed above:  1. His planned procedure is not high risk its to be outpatient elective.  His cardiac problems include mild CAD and paroxysmal atrial fibrillation hypertension and bifascicular heart block all of which are stable asymptomatic.  He requires no further preoperative evaluation.  He understands to hold aspirin for a week prior to surgery resume when safe from bleeding perspective and continue his usual cardiac medications for hypertension.  He is not on anticoagulant. 2. Current medical therapy continue aspirin beta-blocker calcium channel blocker and high intensity statin.  At this time I do not think he requires an ischemia evaluation 3. Atrial fibrillation paroxysmal no recurrence continue beta-blocker and aspirin for stroke prophylaxis 4. Lipids are ideal continue statin 5. Stable EKG pattern asymptomatic he will need an yearly EKG and if he had syncope would need to consider further evaluation 6. Stable diabetes A1c at target   Next appointment: 1 year   Medication Adjustments/Labs and Tests Ordered: Current medicines are reviewed at length with the patient today.  Concerns regarding medicines are outlined above.  No orders of the defined types were placed in this encounter.  No orders of the defined types were placed in this encounter.   Chief Complaint  Patient presents with  .  Pre-op Exam    Rt shoulder surgery    History of Present Illness:    Matthew Schmitt is a 69 y.o. male with a hx of CAD treated medically after coronary angiography 2014 with moderate LAD stenosis, PAF, Hypertension, T2 DM  RBBB and LAHB and hyperlipidemia   She was last seen 11/06/2019.  She was seen at [facility] ED 05/12/2020 with atrial fibrillation converted spontaneously to sinus rhythm.  Personally reviewed her EKG that shows sinus rhythm incomplete right bundle branch block otherwise normal she had a chest x-ray which was read as normal her initial heart rate in the ED was 132 bpm CBC shows a hemoglobin of 14.6 kidney function is normal potassium normal 3.9 she had a D-dimer performed normal to 91 her INR was 2.3 in therapeutic range proBNP level was low at 180 and troponin was undetectable. Compliance with diet, lifestyle and medications: Yes  Arthroscopic right shoulder surgery outpatient 05/27/2020.  From a cardiology perspective he is done well he has no angina palpitations syncope shortness of breath.  Today his blood pressure is at target his EKG is unchanged with a right bundle branch block left anterior hemiblock.  From my perspective he is optimized for the planned procedure and does not require further preoperative cardiovascular evaluation.  He understands to hold aspirin a week before surgery and continue his other usual cardiac medications.  Recent labs 01/01/2020 cholesterol 102 HDL 30 LDL 44.  A1c 6.9% Past Medical History:  Diagnosis Date  . Anxiety   . Arthritis    everywhere  .  Arthrosis of right acromioclavicular joint 01/29/2020  . Atrial flutter [facility])    Onset May 2014 Cardioversion done   . BPH associated with nocturia   . BPH with obstruction/lower urinary tract symptoms 01/04/2017  . CAD (coronary artery disease) 04/02/2013   Cath 2012 moderate mid LAD lesion, otherwise not much disease   . Coronary artery disease    cardiologist-  dr Bettina Gavia (previouly dr  Wynonia Lawman)  . Diabetes ([place]) 01/18/2018  . Essential hypertension, benign   . First degree heart block   . Heart murmur   . History of bilateral knee replacement 02/07/2017  . History of bladder stone   . History of kidney stones   . History of melanoma excision    June 2016  --- s/p  excision right  leg (per pt localized and no recurrence)  . History of renal cell carcinoma 01/2017   s/p  left partial nephrecotmy  . Hypertensive heart disease 04/02/2013  . Impotence of organic origin   . Left-sided low back pain with left-sided sciatica 01/23/2019  . Mixed hyperlipidemia    slightly  . Multiple renal cysts    bilateral  . Nephrolithiasis 05/17/2014  . Nonischemic cardiomyopathy ([place])   . Osteoarthritis   . PAF (paroxysmal atrial fibrillation) ([place])    followed by cardiology  (takes ASA)  . RBBB   . 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 Right ureteral stone   . Rotator cuff syndrome of left shoulder 04/17/2016  . Status post total left knee replacement 01/01/2020  . Tendinopathy of right biceps tendon 01/29/2020  . Tendinopathy of right rotator cuff 01/29/2020  . Type 2 diabetes mellitus treated with insulin [facility])    endocrinologist-  dr Loanne Drilling  . Wears glasses     Past Surgical History:  Procedure Laterality Date  . CARDIAC CATHETERIZATION  08-17-2011  DR SPENCER TILLEY   POSSIBLE MODERATE LAD STENOSIS JUST AFTER THE BIFURCATION OF LARGE DIAGONAL BRANCH/ LVEF  40-45%/ MILD GLOBAL HYPODINESIS (CATH DONE FOR ABNORMAL STRESS TEST OF FIXED LATERAL WALL DEFECT & BIFASCICULAR BLAOCK)  . CARDIOVERSION N/A 04/02/2013   Procedure: CARDIOVERSION;  Surgeon: Jacolyn Reedy, MD;  Location: [place];  Service: Cardiovascular;  Laterality: N/A;  . CYSTOSCOPY WITH LITHOLAPAXY N/A 05/18/2015   Procedure: CYSTOSCOPY WITH LITHOLAPAXY;  Surgeon: Rana Snare, MD;  Location: [facility];  Service: Urology;  Laterality: N/A;  . CYSTOSCOPY WITH  LITHOLAPAXY N/A 01/04/2017   Procedure: CYSTOSCOPY WITH LITHOLAPAXY, Holmium Laser;  Surgeon: Ardis Hughs, MD;  Location: [facility];  Service: Urology;  Laterality: N/A;  . CYSTOSCOPY WITH RETROGRADE PYELOGRAM, URETEROSCOPY AND STENT PLACEMENT  11/03/2012   Procedure: CYSTOSCOPY WITH RETROGRADE PYELOGRAM, URETEROSCOPY AND STENT PLACEMENT;  Surgeon: Bernestine Amass, MD;  Location: [facility];  Service: Urology;  Laterality: Left;  Cystoscopy/Left Retrograde/Ureteroscopy/Holmium Laser Litho/Double J Stent  . CYSTOSCOPY WITH URETEROSCOPY, STONE BASKETRY AND STENT PLACEMENT Right 05/15/2019   Procedure: CYSTOSCOPY WITH URETEROSCOPY, LASER LITHOTRIPSY STONE BASKETRY AND STENT PLACEMENT, RIGHT RETROGRADE;  Surgeon: Ardis Hughs, MD;  Location: [facility];  Service: Urology;  Laterality: Right;  . HOLMIUM LASER APPLICATION  74/94/4967   Procedure: HOLMIUM LASER APPLICATION;  Surgeon: Bernestine Amass, MD;  Location: [facility];  Service: Urology;  Laterality: Left;  . HOLMIUM LASER APPLICATION N/A 5/91/6384   Procedure: HOLMIUM LASER APPLICATION;  Surgeon: Rana Snare, MD;  Location: [facility];  Service: Urology;  Laterality:  N/A;  . KNEE SURGERY Bilateral [place]  . MELANOMA EXCISION Bilateral June 2016   20th-- left leg //  1st-- right leg w/ skin graft from right thigh (no lymph node bx)  . NEPHROLITHOTOMY Right 05/17/2014   Procedure: NEPHROLITHOTOMY PERCUTANEOUS;  Surgeon: Bernestine Amass, MD;  [facility];  Service: Urology;  Laterality: Right;  . PILONIDAL CYST EXCISION    . ROBOTIC ASSITED PARTIAL NEPHRECTOMY Left 06/18/2013   Procedure: ROBOTIC ASSISTED PARTIAL NEPHRECTOMY;  Surgeon: Dutch Gray, MD;  Location[facility];  Service: Urology;  Laterality: Left;  . ROTATOR CUFF REPAIR Left 07/2016  . TONSILLECTOMY  19  (age 42)  . TOTAL KNEE ARTHROPLASTY Left 02/07/2017   Procedure: LEFT TOTAL KNEE ARTHROPLASTY;   Surgeon: Leandrew Koyanagi, MD;  Location: [place];  Service: Orthopedics;  Laterality: Left;  . TOTAL KNEE ARTHROPLASTY Right 04/25/2017   Procedure: RIGHT TOTAL KNEE ARTHROPLASTY;  Surgeon: Leandrew Koyanagi, MD;  Location: [place];  Service: Orthopedics;  Laterality: Right;  . TRANSURETHRAL RESECTION OF PROSTATE N/A 01/04/2017   Procedure: TRANSURETHRAL RESECTION OF THE PROSTATE (TURP);  Surgeon: Ardis Hughs, MD;  Location[facility];  Service: Urology;  Laterality: N/A;    Current Medications: Current Meds  Medication Sig  . amLODIPine-Valsartan-HCTZ 10-320-25 MG TABS Take by mouth.  Marland Kitchen aspirin EC 81 MG tablet Take 81 mg by mouth daily.  . carvedilol (COREG) 25 MG tablet Take 25 mg by mouth every evening.  . Dulaglutide (TRULICITY) 4.5 YJ/8.5UD SOPN Inject 0.5 mLs (4.5 mg total) into the skin once a week.  Marland Kitchen glucose blood (ONE TOUCH ULTRA TEST) test strip 1 each by Other route 2 (two) times daily. And lancets 2/day                        .  . Insulin Glargine (BASAGLAR KWIKPEN) 100 UNIT/ML Inject 0.6 mLs (60 Units total) into the skin every morning. E11.9  . Insulin Pen Needle (PEN NEEDLES) 30G X 8 MM MISC 1 each by Does not apply route daily. E11.9  . Menthol, Topical Analgesic, (BENGAY EX) Apply 1 application topically daily as needed (pain).  . rosuvastatin (CRESTOR) 10 MG tablet Take 1 tablet (10 mg total) by mouth daily.  . sildenafil (VIAGRA) 100 MG tablet Take 100 mg by mouth daily as needed.  . valsartan-hydrochlorothiazide (DIOVAN-HCT) 320-25 MG tablet Take 1 tablet by mouth daily.     Allergies:   Actos [pioglitazone]   Social History   Socioeconomic History  . Marital status: Married    Spouse name: Not on file  . Number of children: 2  . Years of education: 76  . Highest education level: Not on file  Occupational History  . Occupation: Government social research officer, Psychologist, occupational  Tobacco Use  . Smoking status: Former Smoker    Packs/day: 1.00    Years: 6.00    Pack years: 6.00     Types: Cigarettes    Quit date: 10/31/1969    Years since quitting: 50.5  . Smokeless tobacco: Never Used  Vaping Use  . Vaping Use: Never used  Substance and Sexual Activity  . Alcohol use: Yes    Alcohol/week: 0.0 standard drinks    Comment: OCCASIONAL  . Drug use: No  . Sexual activity: Never  Other Topics Concern  . Not on file  Social History Narrative   Regular exercise-no   Social Determinants of Health   Financial Resource Strain:   .  Difficulty of Paying Living Expenses:   Food Insecurity:   . Worried About Charity fundraiser in the Last Year:   . Arboriculturist in the Last Year:   Transportation Needs:   . Film/video editor (Medical):   Marland Kitchen Lack of Transportation (Non-Medical):   Physical Activity:   . Days of Exercise per Week:   . Minutes of Exercise per Session:   Stress:   . Feeling of Stress :   Social Connections:   . Frequency of Communication with Friends and Family:   . Frequency of Social Gatherings with Friends and Family:   . Attends Religious Services:   . Active Member of Clubs or Organizations:   . Attends Archivist Meetings:   Marland Kitchen Marital Status:      Family History: The patient's family history includes Arthritis in his father and mother; Cancer in his maternal uncle and paternal uncle; Diabetes in his brother and father; Heart attack (age of onset: 5) in his brother; Hypertension in his father and mother; Lymphoma in his paternal grandfather. ROS:   Please see the history of present illness.    All other systems reviewed and are negative.  EKGs/Labs/Other Studies Reviewed:    The following studies were reviewed today:  EKG:  EKG ordered today and personally reviewed.  The ekg ordered today demonstrates unchanged pattern right bundle branch block left anterior hemiblock.  Recent Labs: 01/01/2020: ALT 40; BUN 22; Creatinine, Ser 1.29; Potassium 4.2; Sodium 146  Recent Lipid Panel    Component Value Date/Time   CHOL 102  01/01/2020 0948   TRIG 144 01/01/2020 0948   HDL 30 (L) 01/01/2020 0948   CHOLHDL 3.4 01/01/2020 0948   CHOLHDL 4.2 07/20/2014 0822   VLDL 26 07/20/2014 0822   LDLCALC 47 01/01/2020 0948    Physical Exam:    VS:  BP 120/80 (BP Location: Right Arm, Patient Position: Sitting, Cuff Size: Normal)   Pulse 72   Ht 5\' 11"  (1.803 m)   Wt 237 lb (107.5 kg)   SpO2 97%   BMI 33.05 kg/m     Wt Readings from Last 3 Encounters:  05/19/20 237 lb (107.5 kg)  04/22/20 238 lb 12.8 oz (108.3 kg)  03/07/20 240 lb (108.9 kg)     GEN:  Well nourished, well developed in no acute distress HEENT: Normal NECK: No JVD; No carotid bruits LYMPHATICS: No lymphadenopathy CARDIAC: RRR, no murmurs, rubs, gallops RESPIRATORY:  Clear to auscultation without rales, wheezing or rhonchi  ABDOMEN: Soft, non-tender, non-distended MUSCULOSKELETAL:  No edema; No deformity  SKIN: Warm and dry NEUROLOGIC:  Alert and oriented x 3 PSYCHIATRIC:  Normal affect    Signed, Shirlee More, MD  05/19/2020 2:00 PM    [place]

## 2020-05-19 ENCOUNTER — Other Ambulatory Visit: Payer: Self-pay

## 2020-05-19 ENCOUNTER — Ambulatory Visit: Payer: BC Managed Care – PPO | Admitting: Cardiology

## 2020-05-19 ENCOUNTER — Encounter: Payer: Self-pay | Admitting: Cardiology

## 2020-05-19 ENCOUNTER — Encounter (HOSPITAL_BASED_OUTPATIENT_CLINIC_OR_DEPARTMENT_OTHER): Payer: Self-pay | Admitting: Orthopaedic Surgery

## 2020-05-19 VITALS — BP 120/80 | HR 72 | Ht 71.0 in | Wt 237.0 lb

## 2020-05-19 DIAGNOSIS — I25118 Atherosclerotic heart disease of native coronary artery with other forms of angina pectoris: Secondary | ICD-10-CM | POA: Diagnosis not present

## 2020-05-19 DIAGNOSIS — I480 Paroxysmal atrial fibrillation: Secondary | ICD-10-CM | POA: Diagnosis not present

## 2020-05-19 DIAGNOSIS — I119 Hypertensive heart disease without heart failure: Secondary | ICD-10-CM | POA: Diagnosis not present

## 2020-05-19 DIAGNOSIS — Z01810 Encounter for preprocedural cardiovascular examination: Secondary | ICD-10-CM

## 2020-05-19 DIAGNOSIS — I452 Bifascicular block: Secondary | ICD-10-CM

## 2020-05-19 DIAGNOSIS — E782 Mixed hyperlipidemia: Secondary | ICD-10-CM

## 2020-05-19 NOTE — Patient Instructions (Signed)

## 2020-05-20 NOTE — Progress Notes (Signed)
Chart reviewed with Dr Kalman Shan, aware that pt EF 25-30% on ECHO in 2014 was done while pt in a-flutter. ECHO has not been redone since pt cardioverted into SR. Has cards clearance on chart, OK for [facility].

## 2020-05-24 ENCOUNTER — Other Ambulatory Visit (HOSPITAL_COMMUNITY): Payer: BC Managed Care – PPO

## 2020-05-25 ENCOUNTER — Other Ambulatory Visit (HOSPITAL_COMMUNITY)
Admission: RE | Admit: 2020-05-25 | Discharge: 2020-05-25 | Disposition: A | Discharge status: Home / Self Care | Payer: BC Managed Care – PPO | Source: Ambulatory Visit | Attending: Orthopaedic Surgery | Admitting: Orthopaedic Surgery

## 2020-05-25 ENCOUNTER — Encounter (HOSPITAL_BASED_OUTPATIENT_CLINIC_OR_DEPARTMENT_OTHER)
Admission: RE | Admit: 2020-05-25 | Discharge: 2020-05-25 | Disposition: A | Discharge status: Home / Self Care | Payer: BC Managed Care – PPO | Source: Ambulatory Visit

## 2020-05-25 DIAGNOSIS — Z79899 Other long term (current) drug therapy: Secondary | ICD-10-CM | POA: Diagnosis not present

## 2020-05-25 DIAGNOSIS — Z85528 Personal history of other malignant neoplasm of kidney: Secondary | ICD-10-CM | POA: Diagnosis not present

## 2020-05-25 DIAGNOSIS — Z01812 Encounter for preprocedural laboratory examination: Secondary | ICD-10-CM | POA: Insufficient documentation

## 2020-05-25 DIAGNOSIS — M94211 Chondromalacia, right shoulder: Secondary | ICD-10-CM | POA: Diagnosis not present

## 2020-05-25 DIAGNOSIS — Z87891 Personal history of nicotine dependence: Secondary | ICD-10-CM | POA: Diagnosis not present

## 2020-05-25 DIAGNOSIS — Z20822 Contact with and (suspected) exposure to covid-19: Secondary | ICD-10-CM | POA: Insufficient documentation

## 2020-05-25 DIAGNOSIS — I2510 Atherosclerotic heart disease of native coronary artery without angina pectoris: Secondary | ICD-10-CM | POA: Diagnosis not present

## 2020-05-25 DIAGNOSIS — I480 Paroxysmal atrial fibrillation: Secondary | ICD-10-CM | POA: Diagnosis not present

## 2020-05-25 DIAGNOSIS — Z7982 Long term (current) use of aspirin: Secondary | ICD-10-CM | POA: Diagnosis not present

## 2020-05-25 DIAGNOSIS — Z8249 Family history of ischemic heart disease and other diseases of the circulatory system: Secondary | ICD-10-CM | POA: Diagnosis not present

## 2020-05-25 DIAGNOSIS — N529 Male erectile dysfunction, unspecified: Secondary | ICD-10-CM | POA: Diagnosis not present

## 2020-05-25 DIAGNOSIS — M7551 Bursitis of right shoulder: Secondary | ICD-10-CM | POA: Diagnosis not present

## 2020-05-25 DIAGNOSIS — I119 Hypertensive heart disease without heart failure: Secondary | ICD-10-CM | POA: Diagnosis not present

## 2020-05-25 DIAGNOSIS — E782 Mixed hyperlipidemia: Secondary | ICD-10-CM | POA: Diagnosis not present

## 2020-05-25 DIAGNOSIS — Z833 Family history of diabetes mellitus: Secondary | ICD-10-CM | POA: Diagnosis not present

## 2020-05-25 DIAGNOSIS — M65811 Other synovitis and tenosynovitis, right shoulder: Secondary | ICD-10-CM | POA: Diagnosis not present

## 2020-05-25 DIAGNOSIS — M7541 Impingement syndrome of right shoulder: Secondary | ICD-10-CM | POA: Diagnosis not present

## 2020-05-25 DIAGNOSIS — Z8261 Family history of arthritis: Secondary | ICD-10-CM | POA: Diagnosis not present

## 2020-05-25 DIAGNOSIS — Z85820 Personal history of malignant melanoma of skin: Secondary | ICD-10-CM | POA: Diagnosis not present

## 2020-05-25 DIAGNOSIS — Z794 Long term (current) use of insulin: Secondary | ICD-10-CM | POA: Diagnosis not present

## 2020-05-25 DIAGNOSIS — M25711 Osteophyte, right shoulder: Secondary | ICD-10-CM | POA: Diagnosis not present

## 2020-05-25 DIAGNOSIS — Z96653 Presence of artificial knee joint, bilateral: Secondary | ICD-10-CM | POA: Diagnosis not present

## 2020-05-25 DIAGNOSIS — M19011 Primary osteoarthritis, right shoulder: Secondary | ICD-10-CM | POA: Diagnosis not present

## 2020-05-25 DIAGNOSIS — E119 Type 2 diabetes mellitus without complications: Secondary | ICD-10-CM | POA: Diagnosis not present

## 2020-05-25 DIAGNOSIS — M75111 Incomplete rotator cuff tear or rupture of right shoulder, not specified as traumatic: Secondary | ICD-10-CM | POA: Diagnosis not present

## 2020-05-25 DIAGNOSIS — Z905 Acquired absence of kidney: Secondary | ICD-10-CM | POA: Diagnosis not present

## 2020-05-25 LAB — BASIC METABOLIC PANEL
Anion gap: 11 (ref 5–15)
BUN: 20 mg/dL (ref 8–23)
CO2: 24 mmol/L (ref 22–32)
Calcium: 9 mg/dL (ref 8.9–10.3)
Chloride: 104 mmol/L (ref 98–111)
Creatinine, Ser: 1.1 mg/dL (ref 0.61–1.24)
GFR calc Af Amer: >60 mL/min (ref >60)
GFR calc non Af Amer: >60 mL/min (ref >60)
Glucose, Bld: 176 mg/dL — ABNORMAL HIGH (ref 70–99)
Potassium: 3.4 mmol/L — ABNORMAL LOW (ref 3.5–5.1)
Sodium: 139 mmol/L (ref 135–145)

## 2020-05-25 LAB — SARS CORONAVIRUS 2 (TAT 6-24 HRS): SARS Coronavirus 2: NEGATIVE

## 2020-05-25 NOTE — Progress Notes (Signed)

## 2020-05-27 ENCOUNTER — Other Ambulatory Visit: Payer: Self-pay

## 2020-05-27 ENCOUNTER — Ambulatory Visit (HOSPITAL_BASED_OUTPATIENT_CLINIC_OR_DEPARTMENT_OTHER)
Admission: RE | Admit: 2020-05-27 | Discharge: 2020-05-27 | Disposition: A | Discharge status: Home / Self Care | Payer: BC Managed Care – PPO | Attending: Orthopaedic Surgery | Admitting: Orthopaedic Surgery

## 2020-05-27 ENCOUNTER — Ambulatory Visit (HOSPITAL_BASED_OUTPATIENT_CLINIC_OR_DEPARTMENT_OTHER): Payer: BC Managed Care – PPO | Admitting: Certified Registered"

## 2020-05-27 ENCOUNTER — Encounter (HOSPITAL_BASED_OUTPATIENT_CLINIC_OR_DEPARTMENT_OTHER)
Admission: RE | Disposition: A | Discharge status: Home / Self Care | Payer: Self-pay | Source: Home / Self Care | Attending: Orthopaedic Surgery

## 2020-05-27 ENCOUNTER — Encounter (HOSPITAL_BASED_OUTPATIENT_CLINIC_OR_DEPARTMENT_OTHER): Payer: Self-pay | Admitting: Orthopaedic Surgery

## 2020-05-27 ENCOUNTER — Encounter: Payer: Self-pay | Admitting: Orthopaedic Surgery

## 2020-05-27 DIAGNOSIS — M7541 Impingement syndrome of right shoulder: Secondary | ICD-10-CM | POA: Diagnosis present

## 2020-05-27 DIAGNOSIS — Z87891 Personal history of nicotine dependence: Secondary | ICD-10-CM | POA: Diagnosis not present

## 2020-05-27 DIAGNOSIS — M65811 Other synovitis and tenosynovitis, right shoulder: Secondary | ICD-10-CM | POA: Diagnosis not present

## 2020-05-27 DIAGNOSIS — S43431A Superior glenoid labrum lesion of right shoulder, initial encounter: Secondary | ICD-10-CM | POA: Diagnosis not present

## 2020-05-27 DIAGNOSIS — M25711 Osteophyte, right shoulder: Secondary | ICD-10-CM | POA: Diagnosis not present

## 2020-05-27 DIAGNOSIS — M67911 Unspecified disorder of synovium and tendon, right shoulder: Secondary | ICD-10-CM | POA: Diagnosis present

## 2020-05-27 DIAGNOSIS — N529 Male erectile dysfunction, unspecified: Secondary | ICD-10-CM | POA: Insufficient documentation

## 2020-05-27 DIAGNOSIS — M94211 Chondromalacia, right shoulder: Secondary | ICD-10-CM | POA: Diagnosis not present

## 2020-05-27 DIAGNOSIS — Z85820 Personal history of malignant melanoma of skin: Secondary | ICD-10-CM | POA: Diagnosis not present

## 2020-05-27 DIAGNOSIS — M75111 Incomplete rotator cuff tear or rupture of right shoulder, not specified as traumatic: Secondary | ICD-10-CM | POA: Insufficient documentation

## 2020-05-27 DIAGNOSIS — Z833 Family history of diabetes mellitus: Secondary | ICD-10-CM | POA: Insufficient documentation

## 2020-05-27 DIAGNOSIS — M19011 Primary osteoarthritis, right shoulder: Secondary | ICD-10-CM | POA: Diagnosis present

## 2020-05-27 DIAGNOSIS — I2510 Atherosclerotic heart disease of native coronary artery without angina pectoris: Secondary | ICD-10-CM | POA: Insufficient documentation

## 2020-05-27 DIAGNOSIS — E119 Type 2 diabetes mellitus without complications: Secondary | ICD-10-CM | POA: Insufficient documentation

## 2020-05-27 DIAGNOSIS — M67921 Unspecified disorder of synovium and tendon, right upper arm: Secondary | ICD-10-CM | POA: Diagnosis not present

## 2020-05-27 DIAGNOSIS — G8918 Other acute postprocedural pain: Secondary | ICD-10-CM | POA: Diagnosis not present

## 2020-05-27 DIAGNOSIS — I119 Hypertensive heart disease without heart failure: Secondary | ICD-10-CM | POA: Diagnosis not present

## 2020-05-27 DIAGNOSIS — I480 Paroxysmal atrial fibrillation: Secondary | ICD-10-CM | POA: Diagnosis not present

## 2020-05-27 DIAGNOSIS — Z7982 Long term (current) use of aspirin: Secondary | ICD-10-CM | POA: Insufficient documentation

## 2020-05-27 DIAGNOSIS — Z8249 Family history of ischemic heart disease and other diseases of the circulatory system: Secondary | ICD-10-CM | POA: Insufficient documentation

## 2020-05-27 DIAGNOSIS — M7551 Bursitis of right shoulder: Secondary | ICD-10-CM | POA: Insufficient documentation

## 2020-05-27 DIAGNOSIS — Z794 Long term (current) use of insulin: Secondary | ICD-10-CM | POA: Insufficient documentation

## 2020-05-27 DIAGNOSIS — E782 Mixed hyperlipidemia: Secondary | ICD-10-CM | POA: Insufficient documentation

## 2020-05-27 DIAGNOSIS — Z85528 Personal history of other malignant neoplasm of kidney: Secondary | ICD-10-CM | POA: Insufficient documentation

## 2020-05-27 DIAGNOSIS — Z905 Acquired absence of kidney: Secondary | ICD-10-CM | POA: Insufficient documentation

## 2020-05-27 DIAGNOSIS — Z79899 Other long term (current) drug therapy: Secondary | ICD-10-CM | POA: Insufficient documentation

## 2020-05-27 DIAGNOSIS — Z96653 Presence of artificial knee joint, bilateral: Secondary | ICD-10-CM | POA: Insufficient documentation

## 2020-05-27 DIAGNOSIS — Z8261 Family history of arthritis: Secondary | ICD-10-CM | POA: Insufficient documentation

## 2020-05-27 HISTORY — PX: SHOULDER ARTHROSCOPY WITH ROTATOR CUFF REPAIR AND SUBACROMIAL DECOMPRESSION: SHX5686

## 2020-05-27 LAB — GLUCOSE, CAPILLARY
Glucose-Capillary: 148 mg/dL — ABNORMAL HIGH (ref 70–99)
Glucose-Capillary: 106 mg/dL — ABNORMAL HIGH (ref 70–99)

## 2020-05-27 SURGERY — SHOULDER ARTHROSCOPY WITH ROTATOR CUFF REPAIR AND SUBACROMIAL DECOMPRESSION
Anesthesia: Regional | Site: Shoulder | Laterality: Right

## 2020-05-27 MED ORDER — BUPIVACAINE LIPOSOME 1.3 % IJ SUSP
INTRAMUSCULAR | Status: DC | PRN
  Administered 2020-05-27: 10 mL via PERINEURAL

## 2020-05-27 MED ORDER — ONDANSETRON HCL 4 MG PO TABS: 4.0000 mg | ORAL_TABLET | Freq: Three times a day (TID) | ORAL | 0 refills | Status: DC | PRN

## 2020-05-27 MED ORDER — ONDANSETRON HCL 4 MG/2ML IJ SOLN
INTRAMUSCULAR | Status: DC | PRN
  Administered 2020-05-27: 4 mg via INTRAVENOUS

## 2020-05-27 MED ORDER — CEFAZOLIN SODIUM-DEXTROSE 2-4 GM/100ML-% IV SOLN
INTRAVENOUS | Status: AC
  Filled 2020-05-27: qty 100

## 2020-05-27 MED ORDER — EPINEPHRINE PF 1 MG/ML IJ SOLN
INTRAMUSCULAR | Status: DC | PRN
  Administered 2020-05-27: 1 mg

## 2020-05-27 MED ORDER — SUGAMMADEX SODIUM 500 MG/5ML IV SOLN: INTRAVENOUS | Status: DC | PRN

## 2020-05-27 MED ORDER — ACETAMINOPHEN 500 MG PO TABS
1000.0000 mg | ORAL_TABLET | Freq: Once | ORAL | Status: AC
  Administered 2020-05-27: 1000 mg via ORAL

## 2020-05-27 MED ORDER — ROCURONIUM BROMIDE 100 MG/10ML IV SOLN
INTRAVENOUS | Status: DC | PRN
  Administered 2020-05-27: 100 mg via INTRAVENOUS

## 2020-05-27 MED ORDER — MIDAZOLAM HCL 2 MG/2ML IJ SOLN
INTRAMUSCULAR | Status: AC
  Filled 2020-05-27: qty 2

## 2020-05-27 MED ORDER — FENTANYL CITRATE (PF) 100 MCG/2ML IJ SOLN
INTRAMUSCULAR | Status: AC
  Filled 2020-05-27: qty 2

## 2020-05-27 MED ORDER — CEFAZOLIN SODIUM-DEXTROSE 2-4 GM/100ML-% IV SOLN
2.0000 g | INTRAVENOUS | Status: AC
  Administered 2020-05-27: 2 g via INTRAVENOUS

## 2020-05-27 MED ORDER — EPHEDRINE SULFATE 50 MG/ML IJ SOLN
INTRAMUSCULAR | Status: DC | PRN
Start: 2020-05-27 — End: 2020-05-27
  Administered 2020-05-27 (×2): 10 mg via INTRAVENOUS

## 2020-05-27 MED ORDER — DIPHENHYDRAMINE HCL 50 MG/ML IJ SOLN
INTRAMUSCULAR | Status: DC | PRN
Start: 2020-05-27 — End: 2020-05-27
  Administered 2020-05-27: 6.25 mg via INTRAVENOUS

## 2020-05-27 MED ORDER — DEXAMETHASONE SODIUM PHOSPHATE 4 MG/ML IJ SOLN
INTRAMUSCULAR | Status: DC | PRN
  Administered 2020-05-27: 4 mg via INTRAVENOUS

## 2020-05-27 MED ORDER — SUGAMMADEX SODIUM 500 MG/5ML IV SOLN
INTRAVENOUS | Status: DC | PRN
  Administered 2020-05-27: 400 mg via INTRAVENOUS

## 2020-05-27 MED ORDER — LACTATED RINGERS IV SOLN: INTRAVENOUS | Status: DC

## 2020-05-27 MED ORDER — FENTANYL CITRATE (PF) 100 MCG/2ML IJ SOLN: 25.0000 ug | INTRAMUSCULAR | Status: DC | PRN

## 2020-05-27 MED ORDER — OXYCODONE-ACETAMINOPHEN 5-325 MG PO TABS: 1.0000 | ORAL_TABLET | Freq: Three times a day (TID) | ORAL | 0 refills | Status: DC | PRN

## 2020-05-27 MED ORDER — SODIUM CHLORIDE 0.9 % IR SOLN
Status: DC | PRN
  Administered 2020-05-27: 10000 mL

## 2020-05-27 MED ORDER — PROPOFOL 10 MG/ML IV BOLUS
INTRAVENOUS | Status: DC | PRN
  Administered 2020-05-27: 150 mg via INTRAVENOUS

## 2020-05-27 MED ORDER — MIDAZOLAM HCL 2 MG/2ML IJ SOLN
2.0000 mg | Freq: Once | INTRAMUSCULAR | Status: AC
  Administered 2020-05-27: 2 mg via INTRAVENOUS

## 2020-05-27 MED ORDER — PHENYLEPHRINE HCL-NACL 10-0.9 MG/250ML-% IV SOLN
INTRAVENOUS | Status: DC | PRN
Start: 2020-05-27 — End: 2020-05-27
  Administered 2020-05-27: 25 ug/min via INTRAVENOUS
  Administered 2020-05-27: 50 ug/min via INTRAVENOUS

## 2020-05-27 MED ORDER — BUPIVACAINE HCL (PF) 0.5 % IJ SOLN
INTRAMUSCULAR | Status: DC | PRN
  Administered 2020-05-27: 15 mL via PERINEURAL

## 2020-05-27 MED ORDER — FENTANYL CITRATE (PF) 100 MCG/2ML IJ SOLN
100.0000 ug | Freq: Once | INTRAMUSCULAR | Status: AC
  Administered 2020-05-27: 100 ug via INTRAVENOUS

## 2020-05-27 MED ORDER — ACETAMINOPHEN 500 MG PO TABS
ORAL_TABLET | ORAL | Status: AC
  Filled 2020-05-27: qty 2

## 2020-05-27 MED ORDER — METHOCARBAMOL 750 MG PO TABS
750.0000 mg | ORAL_TABLET | Freq: Two times a day (BID) | ORAL | 3 refills | Status: DC | PRN
Start: 2020-05-27 — End: 2020-12-19

## 2020-05-27 SURGICAL SUPPLY — 64 items
APL SKNCLS STERI-STRIP NONHPOA (GAUZE/BANDAGES/DRESSINGS)
BENZOIN TINCTURE PRP APPL 2/3 (GAUZE/BANDAGES/DRESSINGS) IMPLANT
BLADE EXCALIBUR 4.0X13 (MISCELLANEOUS) IMPLANT
BLADE SURG 15 STRL LF DISP TIS (BLADE) IMPLANT
BLADE SURG 15 STRL SS (BLADE)
BURR OVAL 8 FLU 4.0X13 (MISCELLANEOUS) ×2 IMPLANT
CANNULA 5.75X71 LONG (CANNULA) ×2 IMPLANT
CANNULA SHOULDER 7CM (CANNULA) ×2 IMPLANT
CANNULA TWIST IN 8.25X7CM (CANNULA) IMPLANT
CANNULA TWIST IN 8.25X9CM (CANNULA) IMPLANT
CLSR STERI-STRIP ANTIMIC 1/2X4 (GAUZE/BANDAGES/DRESSINGS) IMPLANT
COVER WAND RF STERILE (DRAPES) IMPLANT
DECANTER SPIKE VIAL GLASS SM (MISCELLANEOUS) IMPLANT
DISSECTOR  3.8MM X 13CM (MISCELLANEOUS) ×2
DISSECTOR 3.8MM X 13CM (MISCELLANEOUS) ×1 IMPLANT
DRAPE HALF SHEET 70X43 (DRAPES) ×2 IMPLANT
DRAPE IMP U-DRAPE 54X76 (DRAPES) ×2 IMPLANT
DRAPE INCISE IOBAN 66X45 STRL (DRAPES) ×2 IMPLANT
DRAPE STERI 35X30 U-POUCH (DRAPES) ×2 IMPLANT
DRAPE SURG 17X23 STRL (DRAPES) ×2 IMPLANT
DRAPE U-SHAPE 47X51 STRL (DRAPES) ×2 IMPLANT
DRAPE U-SHAPE 76X120 STRL (DRAPES) ×4 IMPLANT
DRSG PAD ABDOMINAL 8X10 ST (GAUZE/BANDAGES/DRESSINGS) ×4 IMPLANT
DURAPREP 26ML APPLICATOR (WOUND CARE) ×3 IMPLANT
ELECT REM PT RETURN 9FT ADLT (ELECTROSURGICAL) ×2
ELECTRODE REM PT RTRN 9FT ADLT (ELECTROSURGICAL) IMPLANT
GAUZE SPONGE 4X4 12PLY STRL (GAUZE/BANDAGES/DRESSINGS) ×4 IMPLANT
GAUZE XEROFORM 1X8 LF (GAUZE/BANDAGES/DRESSINGS) ×2 IMPLANT
GLOVE BIOGEL PI IND STRL 7.0 (GLOVE) ×1 IMPLANT
GLOVE BIOGEL PI INDICATOR 7.0 (GLOVE) ×1
GLOVE ECLIPSE 7.0 STRL STRAW (GLOVE) ×2 IMPLANT
GLOVE SKINSENSE NS SZ7.5 (GLOVE) ×1
GLOVE SKINSENSE STRL SZ7.5 (GLOVE) ×1 IMPLANT
GLOVE SURG SYN 7.5  E (GLOVE) ×2
GLOVE SURG SYN 7.5 E (GLOVE) ×1 IMPLANT
GLOVE SURG SYN 7.5 PF PI (GLOVE) ×1 IMPLANT
GOWN STRL REIN XL XLG (GOWN DISPOSABLE) ×2 IMPLANT
GOWN STRL REUS W/ TWL LRG LVL3 (GOWN DISPOSABLE) ×1 IMPLANT
GOWN STRL REUS W/ TWL XL LVL3 (GOWN DISPOSABLE) ×1 IMPLANT
GOWN STRL REUS W/TWL LRG LVL3 (GOWN DISPOSABLE) ×2
GOWN STRL REUS W/TWL XL LVL3 (GOWN DISPOSABLE) ×2
MANIFOLD NEPTUNE II (INSTRUMENTS) ×2 IMPLANT
NDL SCORPION MULTI FIRE (NEEDLE) IMPLANT
NEEDLE SCORPION MULTI FIRE (NEEDLE) IMPLANT
PACK BASIN DAY SURGERY FS (CUSTOM PROCEDURE TRAY) ×2 IMPLANT
PACK DSU ARTHROSCOPY (CUSTOM PROCEDURE TRAY) ×2 IMPLANT
PORT APPOLLO RF 90DEGREE MULTI (SURGICAL WAND) ×2 IMPLANT
SHEET MEDIUM DRAPE 40X70 STRL (DRAPES) ×2 IMPLANT
SLEEVE SCD COMPRESS KNEE MED (MISCELLANEOUS) ×2 IMPLANT
SLING ARM FOAM STRAP LRG (SOFTGOODS) ×1 IMPLANT
SUT ETHILON 3 0 PS 1 (SUTURE) ×2 IMPLANT
SUT FIBERWIRE #2 38 T-5 BLUE (SUTURE)
SUT TIGER TAPE 7 IN WHITE (SUTURE) IMPLANT
SUTURE FIBERWR #2 38 T-5 BLUE (SUTURE) IMPLANT
SUTURE TAPE 1.3 40 TPR END (SUTURE) IMPLANT
SUTURE TAPE TIGERLINK 1.3MM BL (SUTURE) IMPLANT
SUTURETAPE 1.3 40 TPR END (SUTURE)
SUTURETAPE TIGERLINK 1.3MM BL (SUTURE)
SYR 50ML LL SCALE MARK (SYRINGE) ×2 IMPLANT
TAPE FIBER 2MM 7IN #2 BLUE (SUTURE) IMPLANT
TOWEL GREEN STERILE FF (TOWEL DISPOSABLE) ×2 IMPLANT
TUBE CONNECTING 20X1/4 (TUBING) ×2 IMPLANT
TUBING ARTHROSCOPY IRRIG 16FT (MISCELLANEOUS) ×2 IMPLANT
WATER STERILE IRR 1000ML POUR (IV SOLUTION) ×1 IMPLANT

## 2020-05-27 NOTE — Progress Notes (Signed)
Assisted Dr. Lanetta Inch with right, ultrasound guided, interscalene  block. Side rails up, monitors on throughout procedure. See vital signs in flow sheet. Tolerated Procedure well.

## 2020-05-27 NOTE — Discharge Instructions (Signed)
Post-operative patient instructions  Shoulder Arthroscopy   . Ice:  Place intermittent ice or cooler pack over your shoulder, 30 minutes on and 30 minutes off.  Continue this for the first 72 hours after surgery, then save ice for use after therapy sessions or on more active days.   . Weight:  You may bear weight on your arm as your symptoms allow. . Motion:  Perform gentle shoulder motion as tolerated . Dressing:  Perform 1st dressing change at 2 days postoperative. A moderate amount of blood tinged drainage is to be expected.  So if you bleed through the dressing on the first or second day or if you have fevers, it is fine to change the dressing/check the wounds early and redress wound.  If it bleeds through again, or if the incisions are leaking frank blood, please call the office. May change dressing every 1-2 days thereafter to help watch wounds. Can purchase Tegaderm (or 42M Nexcare) water resistant dressings at local pharmacy / [facility]. . Shower:  Light shower is ok after 2 days.  Please take shower, NO bath. Recover with gauze and ace wrap to help keep wounds protected.   . Pain medication:  A narcotic pain medication has been prescribed.  Take as directed.  Typically you need narcotic pain medication more regularly during the first 3 to 5 days after surgery.  Decrease your use of the medication as the pain improves.  Narcotics can sometimes cause constipation, even after a few doses.  If you have problems with constipation, you can take an over the counter stool softener or light laxative.  If you have persistent problems, please notify your physician's office. Marland Kitchen Physical therapy: Additional activity guidelines to be provided by your physician or physical therapist at follow-up visits.  . Driving: Do not recommend driving x 2 weeks post surgical, especially if surgery performed on right side. Should not drive while taking narcotic pain medications. It typically takes at least 2 weeks to  restore sufficient neuromuscular function for normal reaction times for driving safety.  . Call [phone] for questions or problems. Evenings you will be forwarded to the hospital operator.  Ask for the orthopaedic physician on call. Please call if you experience:    o Redness, foul smelling, or persistent drainage from the surgical site  o worsening shoulder pain and swelling not responsive to medication  o any calf pain and or swelling of the lower leg  o temperatures greater than 101.5 F o other questions or concerns   Thank you for allowing [place] to be a part of your care.   No Tylenol until 4:30 pm.   Post Anesthesia Home Care Instructions  Activity: Get plenty of rest for the remainder of the day. A responsible individual must stay with you for 24 hours following the procedure.  For the next 24 hours, DO NOT: -Drive a car -Paediatric nurse -Drink alcoholic beverages -Take any medication unless instructed by your physician -Make any legal decisions or sign important papers.  Meals: Start with liquid foods such as gelatin or soup. Progress to regular foods as tolerated. Avoid greasy, spicy, heavy foods. If nausea and/or vomiting occur, drink only clear liquids until the nausea and/or vomiting subsides. Call your physician if vomiting continues.  Special Instructions/Symptoms: Your throat may feel dry or sore from the anesthesia or the breathing tube placed in your throat during surgery. If this causes discomfort, gargle with warm salt water. The discomfort should disappear within 24 hours.  If you had a scopolamine patch placed behind your ear for the management of post- operative nausea and/or vomiting:  1. The medication in the patch is effective for 72 hours, after which it should be removed.  Wrap patch in a tissue and discard in the trash. Wash hands thoroughly with soap and water. 2. You may remove the patch earlier than 72 hours if you experience unpleasant side  effects which may include dry mouth, dizziness or visual disturbances. 3. Avoid touching the patch. Wash your hands with soap and water after contact with the patch.     Information for Discharge Teaching: EXPAREL (bupivacaine liposome injectable suspension)   Your surgeon or anesthesiologist gave you EXPAREL(bupivacaine) to help control your pain after surgery.   EXPAREL is a local anesthetic that provides pain relief by numbing the tissue around the surgical site.  EXPAREL is designed to release pain medication over time and can control pain for up to 72 hours.  Depending on how you respond to EXPAREL, you may require less pain medication during your recovery.  Possible side effects:  Temporary loss of sensation or ability to move in the area where bupivacaine was injected.  Nausea, vomiting, constipation  Rarely, numbness and tingling in your mouth or lips, lightheadedness, or anxiety may occur.  Call your doctor right away if you think you may be experiencing any of these sensations, or if you have other questions regarding possible side effects.  Follow all other discharge instructions given to you by your surgeon or nurse. Eat a healthy diet and drink plenty of water or other fluids.  If you return to the hospital for any reason within 96 hours following the administration of EXPAREL, it is important for health care providers to know that you have received this anesthetic. A teal colored band has been placed on your arm with the date, time and amount of EXPAREL you have received in order to alert and inform your health care providers. Please leave this armband in place for the full 96 hours following administration, and then you may remove the band.

## 2020-05-27 NOTE — Anesthesia Preprocedure Evaluation (Addendum)
Anesthesia Evaluation  Patient identified by MRN, date of birth, ID band Patient awake    Reviewed: Allergy & Precautions, NPO status , Patient's Chart, lab work & pertinent test results, reviewed documented beta blocker date and time   Airway Mallampati: I  TM Distance: >3 FB Neck ROM: Full    Dental  (+) Teeth Intact, Dental Advisory Given   Pulmonary neg pulmonary ROS, former smoker,    Pulmonary exam normal breath sounds clear to auscultation       Cardiovascular hypertension, Pt. on medications and Pt. on home beta blockers + CAD  Normal cardiovascular exam+ dysrhythmias Atrial Fibrillation  Rhythm:Regular Rate:Normal     Neuro/Psych PSYCHIATRIC DISORDERS Anxiety negative neurological ROS     GI/Hepatic negative GI ROS, Neg liver ROS,   Endo/Other  negative endocrine ROSdiabetes, Insulin Dependent  Renal/GU negative Renal ROS  negative genitourinary   Musculoskeletal  (+) Arthritis ,   Abdominal   Peds  Hematology negative hematology ROS (+)   Anesthesia Other Findings   Reproductive/Obstetrics                            Anesthesia Physical Anesthesia Plan  ASA: III  Anesthesia Plan: General and Regional   Post-op Pain Management:  Regional for Post-op pain   Induction: Intravenous  PONV Risk Score and Plan: 2 and Midazolam, Dexamethasone and Ondansetron  Airway Management Planned: Oral ETT  Additional Equipment:   Intra-op Plan:   Post-operative Plan: Extubation in OR  Informed Consent: I have reviewed the patients History and Physical, chart, labs and discussed the procedure including the risks, benefits and alternatives for the proposed anesthesia with the patient or authorized representative who has indicated his/her understanding and acceptance.     Dental advisory given  Plan Discussed with: CRNA  Anesthesia Plan Comments:         Anesthesia Quick  Evaluation

## 2020-05-27 NOTE — Transfer of Care (Signed)
Immediate Anesthesia Transfer of Care Note  Patient: Matthew Schmitt  Procedure(s) Performed: RIGHT SHOULDER ARTHROSCOPY WITH EXTENSIVE DEBRIDEMENT, DISTAL CLAVICLE EXCISION AND SUBACROMIAL DECOMPRESSION (Right Shoulder)  Patient Location: PACU  Anesthesia Type:GA combined with regional for post-op pain  Level of Consciousness: sedated  Airway & Oxygen Therapy: Patient Spontanous Breathing and Patient connected to face mask oxygen  Post-op Assessment: Report given to RN and Post -op Vital signs reviewed and stable  Post vital signs: Reviewed and stable    Last Vitals:  Vitals Value Taken Time  BP 134/75 05/27/20 1345  Temp    Pulse 72 05/27/20 1347  Resp 17 05/27/20 1347  SpO2 97 % 05/27/20 1347  Vitals shown include unvalidated device data.  Last Pain:  Vitals:   05/27/20 1343  TempSrc:   PainSc: (P) Asleep      Patients Stated Pain Goal: 2 (50/72/25 7505)  Complications: No complications documented.

## 2020-05-27 NOTE — Anesthesia Postprocedure Evaluation (Signed)
Anesthesia Post Note  Patient: Matthew Schmitt  Procedure(s) Performed: RIGHT SHOULDER ARTHROSCOPY WITH EXTENSIVE DEBRIDEMENT, DISTAL CLAVICLE EXCISION AND SUBACROMIAL DECOMPRESSION (Right Shoulder)     Patient location during evaluation: PACU Anesthesia Type: Regional and General Level of consciousness: awake and alert Pain management: pain level controlled Vital Signs Assessment: post-procedure vital signs reviewed and stable Respiratory status: spontaneous breathing, nonlabored ventilation, respiratory function stable and patient connected to nasal cannula oxygen Cardiovascular status: blood pressure returned to baseline and stable Postop Assessment: no apparent nausea or vomiting Anesthetic complications: no   No complications documented.  Last Vitals:  Vitals:   05/27/20 1430 05/27/20 1450  BP:  134/80  Pulse: 63 76  Resp: 16 16  Temp:  36.8 C  SpO2: 95% 96%    Last Pain:  Vitals:   05/27/20 1450  TempSrc:   PainSc: 0-No pain                 Britt Petroni L Karolyna Bianchini

## 2020-05-27 NOTE — H&P (Signed)
PREOPERATIVE H&P  Chief Complaint: right shoulder rotator cuff tendinopathy, acromioclavicular joint arthrosis, impingement, biceps tendinopathy  HPI: Matthew Schmitt is a 69 y.o. male who presents for surgical treatment of right shoulder rotator cuff tendinopathy, acromioclavicular joint arthrosis, impingement, biceps tendinopathy.  He denies any changes in medical history.  Past Medical History:  Diagnosis Date  . Anxiety   . Arthritis    everywhere  . Arthrosis of right acromioclavicular joint 01/29/2020  . Atrial flutter [facility])    Onset May 2014 Cardioversion done   . BPH associated with nocturia   . BPH with obstruction/lower urinary tract symptoms 01/04/2017  . CAD (coronary artery disease) 04/02/2013   Cath 2012 moderate mid LAD lesion, otherwise not much disease   . Coronary artery disease    cardiologist-  dr Bettina Gavia (previouly dr Wynonia Lawman)  . Diabetes ([place]) 01/18/2018  . Essential hypertension, benign   . First degree heart block   . Heart murmur   . History of bilateral knee replacement 02/07/2017  . History of bladder stone   . History of kidney stones   . History of melanoma excision    June 2016  --- s/p  excision right  leg (per pt localized and no recurrence)  . History of renal cell carcinoma 01/2017   s/p  left partial nephrecotmy  . Hypertensive heart disease 04/02/2013  . Impotence of organic origin   . Left-sided low back pain with left-sided sciatica 01/23/2019  . Mixed hyperlipidemia    slightly  . Multiple renal cysts    bilateral  . Nephrolithiasis 05/17/2014  . Nonischemic cardiomyopathy ([place])   . Osteoarthritis   . PAF (paroxysmal atrial fibrillation) ([place])    followed by cardiology  (takes ASA)  . RBBB   . 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 Right ureteral stone   . Rotator cuff syndrome of left shoulder 04/17/2016  . Status post total left knee replacement 01/01/2020  . Tendinopathy of right biceps tendon  01/29/2020  . Tendinopathy of right rotator cuff 01/29/2020  . Type 2 diabetes mellitus treated with insulin [facility])    endocrinologist-  dr Loanne Drilling  . Wears glasses    Past Surgical History:  Procedure Laterality Date  . CARDIAC CATHETERIZATION  08-17-2011  DR SPENCER TILLEY   POSSIBLE MODERATE LAD STENOSIS JUST AFTER THE BIFURCATION OF LARGE DIAGONAL BRANCH/ LVEF  40-45%/ MILD GLOBAL HYPODINESIS (CATH DONE FOR ABNORMAL STRESS TEST OF FIXED LATERAL WALL DEFECT & BIFASCICULAR BLAOCK)  . CARDIOVERSION N/A 04/02/2013   Procedure: CARDIOVERSION;  Surgeon: Jacolyn Reedy, MD;  Location: [place];  Service: Cardiovascular;  Laterality: N/A;  . CYSTOSCOPY WITH LITHOLAPAXY N/A 05/18/2015   Procedure: CYSTOSCOPY WITH LITHOLAPAXY;  Surgeon: Rana Snare, MD;  Location: [facility];  Service: Urology;  Laterality: N/A;  . CYSTOSCOPY WITH LITHOLAPAXY N/A 01/04/2017   Procedure: CYSTOSCOPY WITH LITHOLAPAXY, Holmium Laser;  Surgeon: Ardis Hughs, MD;  Location: [facility];  Service: Urology;  Laterality: N/A;  . CYSTOSCOPY WITH RETROGRADE PYELOGRAM, URETEROSCOPY AND STENT PLACEMENT  11/03/2012   Procedure: CYSTOSCOPY WITH RETROGRADE PYELOGRAM, URETEROSCOPY AND STENT PLACEMENT;  Surgeon: Bernestine Amass, MD;  Location: [facility];  Service: Urology;  Laterality: Left;  Cystoscopy/Left Retrograde/Ureteroscopy/Holmium Laser Litho/Double J Stent  . CYSTOSCOPY WITH URETEROSCOPY, STONE BASKETRY AND STENT PLACEMENT Right 05/15/2019   Procedure: CYSTOSCOPY WITH URETEROSCOPY, LASER LITHOTRIPSY STONE BASKETRY AND STENT PLACEMENT, RIGHT RETROGRADE;  Surgeon: Ardis Hughs, MD;  Location: [place];  Service: Urology;  Laterality: Right;  . HOLMIUM LASER APPLICATION  34/19/6222   Procedure: HOLMIUM LASER APPLICATION;  Surgeon: Bernestine Amass, MD;  Location: [facility];  Service: Urology;  Laterality: Left;  . HOLMIUM LASER APPLICATION N/A 9/79/8921    Procedure: HOLMIUM LASER APPLICATION;  Surgeon: Rana Snare, MD;  Location[facility];  Service: Urology;  Laterality: N/A;  . KNEE SURGERY Bilateral [place]  . MELANOMA EXCISION Bilateral June 2016   20th-- left leg //  1st-- right leg w/ skin graft from right thigh (no lymph node bx)  . NEPHROLITHOTOMY Right 05/17/2014   Procedure: NEPHROLITHOTOMY PERCUTANEOUS;  Surgeon: Bernestine Amass, MD;  Location: [facility];  Service: Urology;  Laterality: Right;  . PILONIDAL CYST EXCISION    . ROBOTIC ASSITED PARTIAL NEPHRECTOMY Left 06/18/2013   Procedure: ROBOTIC ASSISTED PARTIAL NEPHRECTOMY;  Surgeon: Dutch Gray, MD;  Location: [facility];  Service: Urology;  Laterality: Left;  . ROTATOR CUFF REPAIR Left 07/2016  . TONSILLECTOMY  47  (age 87)  . TOTAL KNEE ARTHROPLASTY Left 02/07/2017   Procedure: LEFT TOTAL KNEE ARTHROPLASTY;  Surgeon: Leandrew Koyanagi, MD;  Location: [place];  Service: Orthopedics;  Laterality: Left;  . TOTAL KNEE ARTHROPLASTY Right 04/25/2017   Procedure: RIGHT TOTAL KNEE ARTHROPLASTY;  Surgeon: Leandrew Koyanagi, MD;  Location: [place];  Service: Orthopedics;  Laterality: Right;  . TRANSURETHRAL RESECTION OF PROSTATE N/A 01/04/2017   Procedure: TRANSURETHRAL RESECTION OF THE PROSTATE (TURP);  Surgeon: Ardis Hughs, MD;  Location[facility];  Service: Urology;  Laterality: N/A;   Social History   Socioeconomic History  . Marital status: Married    Spouse name: Not on file  . Number of children: 2  . Years of education: 54  . Highest education level: Not on file  Occupational History  . Occupation: Government social research officer, Psychologist, occupational  Tobacco Use  . Smoking status: Former Smoker    Packs/day: 1.00    Years: 6.00    Pack years: 6.00    Types: Cigarettes    Quit date: 10/31/1969    Years since quitting: 50.6  . Smokeless tobacco: Never Used  Vaping Use  . Vaping Use: Never used  Substance and Sexual Activity  . Alcohol use: Yes    Alcohol/week: 0.0 standard  drinks    Comment: OCCASIONAL  . Drug use: No  . Sexual activity: Never  Other Topics Concern  . Not on file  Social History Narrative   Regular exercise-no   Social Determinants of Health   Financial Resource Strain:   . Difficulty of Paying Living Expenses:   Food Insecurity:   . Worried About Charity fundraiser in the Last Year:   . Arboriculturist in the Last Year:   Transportation Needs:   . Film/video editor (Medical):   Marland Kitchen Lack of Transportation (Non-Medical):   Physical Activity:   . Days of Exercise per Week:   . Minutes of Exercise per Session:   Stress:   . Feeling of Stress :   Social Connections:   . Frequency of Communication with Friends and Family:   . Frequency of Social Gatherings with Friends and Family:   . Attends Religious Services:   . Active Member of Clubs or Organizations:   . Attends Archivist Meetings:   Marland Kitchen Marital Status:    Family History  Problem Relation Age of Onset  .  Arthritis Father   . Hypertension Father   . Diabetes Father   . Arthritis Mother   . Hypertension Mother   . Heart attack Brother 19  . Diabetes Brother   . Lymphoma Paternal Grandfather   . Cancer Maternal Uncle   . Cancer Paternal Uncle        type unknown   Allergies  Allergen Reactions  . Actos [Pioglitazone] Swelling    SWELLING REACTION UNSPECIFIED  EDEMA   Prior to Admission medications   Medication Sig Start Date End Date Taking? Authorizing Provider  amLODIPine-Valsartan-HCTZ 10-320-25 MG TABS Take by mouth.   Yes [provider]  aspirin EC 81 MG tablet Take 81 mg by mouth daily.   Yes [provider]  carvedilol (COREG) 25 MG tablet Take 25 mg by mouth every evening.   Yes [provider]  Dulaglutide (TRULICITY) 4.5 MI/6.8EH SOPN Inject 0.5 mLs (4.5 mg total) into the skin once a week. 04/22/20  Yes Renato Shin, MD  glucose blood (ONE TOUCH ULTRA TEST) test strip 1 each by Other route 2 (two) times daily.  And lancets 2/day                        . 06/05/19  Yes Renato Shin, MD  Insulin Glargine [facility] KWIKPEN) 100 UNIT/ML Inject 0.6 mLs (60 Units total) into the skin every morning. E11.9 04/22/20  Yes Renato Shin, MD  Insulin Pen Needle (PEN NEEDLES) 30G X 8 MM MISC 1 each by Does not apply route daily. E11.9 02/05/20  Yes Renato Shin, MD  Menthol, Topical Analgesic, (BENGAY EX) Apply 1 application topically daily as needed (pain).   Yes [provider]  rosuvastatin (CRESTOR) 10 MG tablet Take 1 tablet (10 mg total) by mouth daily. 11/06/19 05/19/20 Yes Richardo Priest, MD  valsartan-hydrochlorothiazide (DIOVAN-HCT) 320-25 MG tablet Take 1 tablet by mouth daily.   Yes [provider]  sildenafil (VIAGRA) 100 MG tablet Take 100 mg by mouth daily as needed. 05/30/18   [provider]     Positive ROS: All other systems have been reviewed and were otherwise negative with the exception of those mentioned in the HPI and as above.  Physical Exam: General: Alert, no acute distress Cardiovascular: No pedal edema Respiratory: No cyanosis, no use of accessory musculature GI: abdomen soft Skin: No lesions in the area of chief complaint Neurologic: Sensation intact distally Psychiatric: Patient is competent for consent with normal mood and affect Lymphatic: no lymphedema  MUSCULOSKELETAL: exam stable  Assessment: right shoulder rotator cuff tendinopathy, acromioclavicular joint arthrosis, impingement, biceps tendinopathy  Plan: Plan for Procedure(s): RIGHT SHOULDER ARTHROSCOPY WITH EXTENSIVE DEBRIDEMENT, DISTAL CLAVICLE EXCISION AND SUBACROMIAL DECOMPRESSION  The risks benefits and alternatives were discussed with the patient including but not limited to the risks of nonoperative treatment, versus surgical intervention including infection, bleeding, nerve injury,  blood clots, cardiopulmonary complications, morbidity, mortality, among others, and they were willing to  proceed.   Preoperative templating of the joint replacement has been completed, documented, and submitted to the Operating Room personnel in order to optimize intra-operative equipment management.   Eduard Roux, MD 05/27/2020 8:06 AM

## 2020-05-27 NOTE — Anesthesia Procedure Notes (Signed)
Anesthesia Regional Block: Interscalene brachial plexus block   Pre-Anesthetic Checklist: ,, timeout performed, Correct Patient, Correct Site, Correct Laterality, Correct Procedure, Correct Position, site marked, Risks and benefits discussed,  Surgical consent,  Pre-op evaluation,  At surgeon's request and post-op pain management  Laterality: Right  Prep: Maximum Sterile Barrier Precautions used, chloraprep       Needles:  Injection technique: Single-shot  Needle Type: Echogenic Stimulator Needle     Needle Length: 4cm  Needle Gauge: 22     Additional Needles:   Procedures:,,,, ultrasound used (permanent image in chart),,,,  Narrative:  Start time: 05/27/2020 11:27 AM End time: 05/27/2020 11:37 AM Injection made incrementally with aspirations every 5 mL.  Performed by: Personally  Anesthesiologist: Freddrick March, MD  Additional Notes: Monitors applied. No increased pain on injection. No increased resistance to injection. Injection made in 5cc increments. Good needle visualization. Patient tolerated procedure well.

## 2020-05-27 NOTE — Anesthesia Procedure Notes (Signed)
Procedure Name: Intubation Date/Time: 05/27/2020 12:08 PM Performed by: Willa Frater, CRNA Pre-anesthesia Checklist: Patient identified, Emergency Drugs available, Suction available and Patient being monitored Patient Re-evaluated:Patient Re-evaluated prior to induction Oxygen Delivery Method: Circle system utilized Preoxygenation: Pre-oxygenation with 100% oxygen Induction Type: IV induction Ventilation: Mask ventilation without difficulty Laryngoscope Size: Mac and 3 Grade View: Grade I Tube type: Oral Number of attempts: 1 Airway Equipment and Method: Stylet and Oral airway Placement Confirmation: ETT inserted through vocal cords under direct vision,  positive ETCO2 and breath sounds checked- equal and bilateral Secured at: 27 cm Tube secured with: Tape Dental Injury: Teeth and Oropharynx as per pre-operative assessment

## 2020-05-29 NOTE — Op Note (Signed)
Date of Surgery: 05/29/2020  INDICATIONS: The patient is a 69 year old male with right shoulder pain that has failed conservative treatment;  The patient did consent to the procedure after discussion of the risks and benefits.  PREOPERATIVE DIAGNOSIS:  1.  Right supraspinatus tendinopathy with partial thickness tear 2.  Right shoulder impingement syndrome 3.  Right shoulder degenerative labral tear 4.  Right shoulder proximal biceps tendinopathy 5.  Right shoulder synovitis, subacromial bursitis 6.  Right AC joint arthrosis  POSTOPERATIVE DIAGNOSIS: Same.  PROCEDURE:  1.  Right shoulder arthroscopic distal clavicle excision 2.  Right shoulder extensive debridement including labrum, synovitis, subacromial bursitis, rotator cuff 3.  Right shoulder subacromial decompression with acromioplasty and CA ligament release  SURGEON: N. Eduard Roux, M.D.  ASSIST: Ciro Backer [place], [place]; necessary for the timely completion of procedure and due to complexity of procedure..  ANESTHESIA:  general, regional  IV FLUIDS AND URINE: See anesthesia.  ESTIMATED BLOOD LOSS: minimal mL.  IMPLANTS: None  COMPLICATIONS: None.  DESCRIPTION OF PROCEDURE: The patient was brought to the operating room and placed supine on the operating table.  The patient had been signed prior to the procedure and this was documented. The patient had the anesthesia placed by the anesthesiologist.  A time-out was performed to confirm that this was the correct patient, site, side and location. The patient did receive antibiotics prior to the incision and was re-dosed during the procedure as needed at indicated intervals.  The patient was then positioned into the beach chair position with all bony prominences well padded and neutral C spine. The patient had the operative extremity prepped and draped in the standard surgical fashion.    Incisions were made for standard shoulder arthroscopy portals.  We first performed a  diagnostic shoulder arthroscopy.  I found significant synovitis and inflamed tissue within the glenohumeral joint mainly of the labrum and the proximal biceps and the rotator interval.  There the articular surface of the rotator cuff demonstrated mild tendinopathy without any full-thickness tears.  The labrum demonstrated severe degenerative labral tear tears mainly anteriorly.  The proximal biceps was also severely degenerative.  A biceps tenotomy was performed.  There was mild chondromalacia.  The arthroscope was then repositioned in the subacromial space and a subacromial bursectomy was performed.  Subacromial decompression was performed along with an acromioplasty using a high-speed bur.  The CA ligament was released.  We then found the distal clavicle which demonstrated a large inferior osteophyte.  Distal clavicle excision was then performed with a high-speed bur taking approximately 8 mm of the distal clavicle.  Once this was done we then evaluated the bursal surface of the rotator cuff.  At the most anterior extent of the supraspinatus tear was a small full-thickness tear that was approximately 5 mm in diameter without any retraction.  The rest of the rotator cuff demonstrated mild tendinopathy.  Given the patient's age and his exam preoperatively I felt that rotator cuff repair was not indicated due to the prolonged recovery that the patient would have to go through.  I felt tha the majority of his pain came from the biceps and the labral tears that were already addressed.  Therefore the bursal surface of the rotator cuff was gently debrided back to stable surface.  Excess fluid was then removed from the shoulder joint.  Hemostasis was obtained.  Incisions were closed with interrupted nylon sutures.  Sterile dressings were applied.  Shoulder sling was placed.  Patient tolerated the procedure  well had no immediate complications.  POSTOPERATIVE PLAN: Discharge home and follow-up in 1 week for suture  removal.  Azucena Cecil, MD [facility] [phone] 9:23 PM

## 2020-05-30 ENCOUNTER — Telehealth: Payer: Self-pay | Admitting: Orthopaedic Surgery

## 2020-05-30 ENCOUNTER — Encounter (HOSPITAL_BASED_OUTPATIENT_CLINIC_OR_DEPARTMENT_OTHER): Payer: Self-pay | Admitting: Orthopaedic Surgery

## 2020-05-30 NOTE — Telephone Encounter (Signed)
Patient called asked if Dr. Erlinda Hong would call him concerning when can he remove the sling? The number to contact patient is [phone]

## 2020-05-30 NOTE — Telephone Encounter (Signed)
Patient aware.

## 2020-05-30 NOTE — Telephone Encounter (Signed)
Anytime he wants to he can remove.

## 2020-06-03 ENCOUNTER — Encounter: Payer: Self-pay | Admitting: Orthopaedic Surgery

## 2020-06-03 ENCOUNTER — Ambulatory Visit (INDEPENDENT_AMBULATORY_CARE_PROVIDER_SITE_OTHER): Payer: BC Managed Care – PPO | Admitting: Orthopaedic Surgery

## 2020-06-03 VITALS — Ht 71.0 in | Wt 234.6 lb

## 2020-06-03 DIAGNOSIS — M7541 Impingement syndrome of right shoulder: Secondary | ICD-10-CM

## 2020-06-03 DIAGNOSIS — M67921 Unspecified disorder of synovium and tendon, right upper arm: Secondary | ICD-10-CM

## 2020-06-03 DIAGNOSIS — M67911 Unspecified disorder of synovium and tendon, right shoulder: Secondary | ICD-10-CM

## 2020-06-03 NOTE — Progress Notes (Signed)
Post-Op Visit Note   Patient: Matthew Schmitt           Date of Birth: 01/22/1951           MRN: [id number] Visit Date: 06/03/2020 PCP: Leonides Sake, MD   Assessment & Plan:  Chief Complaint:  Chief Complaint  Patient presents with   Right Shoulder - Routine Post Op    05/27/20 Right Shoulder Arthro &Deb   Visit Diagnoses:  1. Tendinopathy of right rotator cuff   2. Tendinopathy of right biceps tendon   3. Impingement syndrome of right shoulder     Plan: Matthew Schmitt is 1 week status post right shoulder scope with debridement biceps tenotomy with tiny supraspinatus tear without retraction.  He is doing very well and reports very mild pain.  Mainly soreness and tenderness.  Incisions are all healed.  He does have bruising around his shoulder and arm.  No neurovascular compromise.  Range of motion is to about 90 degrees of abduction and forward flexion.  Sutures removed today.  Begin outpatient PT.  Recheck in 4 weeks.  No pain medication needed.   Follow-Up Instructions: No follow-ups on file.   Orders:  Orders Placed This Encounter  Procedures   Ambulatory referral to Physical Therapy   No orders of the defined types were placed in this encounter.   Imaging: No results found.  PMFS History: Patient Active Problem List   Diagnosis Date Noted   Tendinopathy of right rotator cuff 01/29/2020   Tendinopathy of right biceps tendon 01/29/2020   Impingement syndrome of right shoulder 01/29/2020   Arthrosis of right acromioclavicular joint 01/29/2020   Chronic right shoulder pain 01/01/2020   Status post total left knee replacement 01/01/2020   Left-sided low back pain with left-sided sciatica 01/23/2019   Diabetes ([place]) 01/18/2018   History of bilateral knee replacement 02/07/2017   Primary osteoarthritis of left knee    BPH with obstruction/lower urinary tract symptoms 01/04/2017   Rotator cuff syndrome of left shoulder 04/17/2016   Nephrolithiasis  05/17/2014   Encounter for long-term (current) use of other medications 07/27/2013   Long term current use of anticoagulant therapy    CAD (coronary artery disease) 04/02/2013   Hypertensive heart disease 04/02/2013   RBBB (right bundle branch block with left anterior fascicular block) 04/02/2013   Atrial flutter (HCC)    Nonischemic cardiomyopathy (HCC)    Obesity (BMI 30-39.9)    History of kidney stones    Osteoarthritis    Hyperlipidemia    Past Medical History:  Diagnosis Date   Anxiety    Arthritis    everywhere   Arthrosis of right acromioclavicular joint 01/29/2020   Atrial flutter [facility])    Onset May 2014 Cardioversion done    BPH associated with nocturia    BPH with obstruction/lower urinary tract symptoms 01/04/2017   CAD (coronary artery disease) 04/02/2013   Cath 2012 moderate mid LAD lesion, otherwise not much disease    Coronary artery disease    cardiologist-  dr Bettina Gavia (previouly dr Wynonia Lawman)   Diabetes [facility]) 01/18/2018   Essential hypertension, benign    First degree heart block    Heart murmur    History of bilateral knee replacement 02/07/2017   History of bladder stone    History of kidney stones    History of melanoma excision    June 2016  --- s/p  excision right  leg (per pt localized and no recurrence)   History of renal cell carcinoma  01/2017   s/p  left partial nephrecotmy   Hypertensive heart disease 04/02/2013   Impotence of organic origin    Left-sided low back pain with left-sided sciatica 01/23/2019   Mixed hyperlipidemia    slightly   Multiple renal cysts    bilateral   Nephrolithiasis 05/17/2014   Nonischemic cardiomyopathy (HCC)    Osteoarthritis    PAF (paroxysmal atrial fibrillation) ([place])    followed by cardiology  (takes ASA)   RBBB    RBBB (right bundle branch block with left anterior fascicular block) 04/02/2013   Right ureteral stone    Rotator cuff syndrome of left shoulder 04/17/2016   Status  post total left knee replacement 01/01/2020   Tendinopathy of right biceps tendon 01/29/2020   Tendinopathy of right rotator cuff 01/29/2020   Type 2 diabetes mellitus treated with insulin [facility])    endocrinologist-  dr Loanne Drilling   Wears glasses     Family History  Problem Relation Age of Onset   Arthritis Father    Hypertension Father    Diabetes Father    Arthritis Mother    Hypertension Mother    Heart attack Brother 8   Diabetes Brother    Lymphoma Paternal Grandfather    Cancer Maternal Uncle    Cancer Paternal Uncle        type unknown    Past Surgical History:  Procedure Laterality Date   CARDIAC CATHETERIZATION  08-17-2011  DR Tollie Eth   POSSIBLE MODERATE LAD STENOSIS JUST AFTER THE BIFURCATION OF LARGE DIAGONAL BRANCH/ LVEF  40-45%/ MILD GLOBAL HYPODINESIS (CATH DONE FOR ABNORMAL STRESS TEST OF FIXED LATERAL WALL DEFECT & BIFASCICULAR BLAOCK)   CARDIOVERSION N/A 04/02/2013   Procedure: CARDIOVERSION;  Surgeon: Jacolyn Reedy, MD;  Location: [place];  Service: Cardiovascular;  Laterality: N/A;   CYSTOSCOPY WITH LITHOLAPAXY N/A 05/18/2015   Procedure: CYSTOSCOPY WITH LITHOLAPAXY;  Surgeon: Rana Snare, MD;  Location: [place];  Service: Urology;  Laterality: N/A;   CYSTOSCOPY WITH LITHOLAPAXY N/A 01/04/2017   Procedure: CYSTOSCOPY WITH LITHOLAPAXY, Holmium Laser;  Surgeon: Ardis Hughs, MD;  [facility];  Service: Urology;  Laterality: N/A;   CYSTOSCOPY WITH RETROGRADE PYELOGRAM, URETEROSCOPY AND STENT PLACEMENT  11/03/2012   Procedure: CYSTOSCOPY WITH RETROGRADE PYELOGRAM, URETEROSCOPY AND STENT PLACEMENT;  Surgeon: Bernestine Amass, MD;  Location: [facility];  Service: Urology;  Laterality: Left;  Cystoscopy/Left Retrograde/Ureteroscopy/Holmium Laser Litho/Double J Stent   CYSTOSCOPY WITH URETEROSCOPY, STONE BASKETRY AND STENT PLACEMENT Right 05/15/2019   Procedure: CYSTOSCOPY WITH URETEROSCOPY, LASER LITHOTRIPSY  STONE BASKETRY AND STENT PLACEMENT, RIGHT RETROGRADE;  Surgeon: Ardis Hughs, MD;  Location: [facility];  Service: Urology;  Laterality: Right;   HOLMIUM LASER APPLICATION  93/81/8299   Procedure: HOLMIUM LASER APPLICATION;  Surgeon: Bernestine Amass, MD;  Location: [facility];  Service: Urology;  Laterality: Left;   HOLMIUM LASER APPLICATION N/A 3/71/6967   Procedure: HOLMIUM LASER APPLICATION;  Surgeon: Rana Snare, MD;  Location: [facility];  Service: Urology;  Laterality: N/A;   KNEE SURGERY Bilateral [place] Bilateral June 2016   20th-- left leg //  1st-- right leg w/ skin graft from right thigh (no lymph node bx)   NEPHROLITHOTOMY Right 05/17/2014   Procedure: NEPHROLITHOTOMY PERCUTANEOUS;  Surgeon: Bernestine Amass, MD;  [facility];  Service: Urology;  Laterality: Right;   PILONIDAL CYST EXCISION     ROBOTIC ASSITED  PARTIAL NEPHRECTOMY Left 06/18/2013   Procedure: ROBOTIC ASSISTED PARTIAL NEPHRECTOMY;  Surgeon: Dutch Gray, MD;  [facility];  Service: Urology;  Laterality: Left;   ROTATOR CUFF REPAIR Left 07/2016   SHOULDER ARTHROSCOPY WITH ROTATOR CUFF REPAIR AND SUBACROMIAL DECOMPRESSION Right 05/27/2020   Procedure: RIGHT SHOULDER ARTHROSCOPY WITH EXTENSIVE DEBRIDEMENT, DISTAL CLAVICLE EXCISION AND SUBACROMIAL DECOMPRESSION;  Surgeon: Leandrew Koyanagi, MD;  Location: [place];  Service: Orthopedics;  Laterality: Right;   TONSILLECTOMY  68  (age 56)   TOTAL KNEE ARTHROPLASTY Left 02/07/2017   Procedure: LEFT TOTAL KNEE ARTHROPLASTY;  Surgeon: Leandrew Koyanagi, MD;  Location: [place];  Service: Orthopedics;  Laterality: Left;   TOTAL KNEE ARTHROPLASTY Right 04/25/2017   Procedure: RIGHT TOTAL KNEE ARTHROPLASTY;  Surgeon: Leandrew Koyanagi, MD;  Location: [place];  Service: Orthopedics;  Laterality: Right;   TRANSURETHRAL RESECTION OF PROSTATE N/A 01/04/2017   Procedure: TRANSURETHRAL  RESECTION OF THE PROSTATE (TURP);  Surgeon: Ardis Hughs, MD;  [facility];  Service: Urology;  Laterality: N/A;   Social History   Occupational History   Occupation: Government social research officer, Psychologist, occupational  Tobacco Use   Smoking status: Former Smoker    Packs/day: 1.00    Years: 6.00    Pack years: 6.00    Types: Cigarettes    Quit date: 10/31/1969    Years since quitting: 50.6   Smokeless tobacco: Never Used  Vaping Use   Vaping Use: Never used  Substance and Sexual Activity   Alcohol use: Yes    Alcohol/week: 0.0 standard drinks    Comment: OCCASIONAL   Drug use: No   Sexual activity: Never

## 2020-06-09 ENCOUNTER — Ambulatory Visit: Payer: BC Managed Care – PPO | Admitting: Physical Therapy

## 2020-07-01 ENCOUNTER — Ambulatory Visit: Payer: BC Managed Care – PPO | Admitting: Orthopaedic Surgery

## 2020-07-01 ENCOUNTER — Encounter: Payer: Self-pay | Admitting: Orthopaedic Surgery

## 2020-07-01 ENCOUNTER — Ambulatory Visit: Payer: Self-pay

## 2020-07-01 ENCOUNTER — Other Ambulatory Visit: Payer: Self-pay

## 2020-07-01 DIAGNOSIS — Z96652 Presence of left artificial knee joint: Secondary | ICD-10-CM | POA: Diagnosis not present

## 2020-07-01 DIAGNOSIS — I25118 Atherosclerotic heart disease of native coronary artery with other forms of angina pectoris: Secondary | ICD-10-CM

## 2020-07-01 NOTE — Progress Notes (Signed)
Office Visit Note   Patient: Matthew Schmitt           Date of Birth: 1951-04-11           MRN: [id number] Visit Date: 07/01/2020              Requested by: Leonides Sake, MD [place],  [place] [id number] PCP: Leonides Sake, MD   Assessment & Plan: Visit Diagnoses:  1. Status post total left knee replacement     Plan: Impression is #1 status post right shoulder arthroscopic debridement decompression.  #2 status post left total knee replacement with loosening of the tibial tray.  In regards to the right shoulder, I provided him with a hard copy of the physical therapy prescription for which she will bring to his physical therapy location of choice as he lives in [place].  He will follow-up with [place] in 6 weeks time for recheck.  In regards to his left knee, will obtain blood work today for inflammatory markers to include CBC, sedimentation rate and CRP.  We will call him with the results.  We will also continue to monitor this every 6 months with 2 view x-rays of the left knee.  Follow-Up Instructions: Return in about 6 weeks (around 08/12/2020).   Orders:  Orders Placed This Encounter  Procedures  . XR KNEE 3 VIEW LEFT   No orders of the defined types were placed in this encounter.     Procedures: No procedures performed   Clinical Data: No additional findings.   Subjective: Chief Complaint  Patient presents with  . Right Shoulder - Routine Post Op  . Left Knee - Follow-up    HPI patient is a pleasant 69 year old gentleman who comes in today 5 weeks status post right shoulder arthroscopic debridement, distal clavicle excision and subacromial decompression, date of surgery 05/27/2020.  He has been doing fairly well.  He has minimal pain.  He was never contacted for PT but has regained near full range of motion.  He is still lacking strength.  The other issue he brings up today is left knee discomfort with pivoting.  He is status post left total knee  replacement 04/25/2017.  There is been radiographic evidence of tibial tray loosening over the past few years which is really not been symptomatic.  He states this is just becoming more noticeable and really only occurs with pivoting.  He has been taking Aleve but this is for his whole body not just his knee or shoulder.  Review of Systems as detailed in HPI.  All others reviewed and are negative.   Objective: Vital Signs: There were no vitals taken for this visit.  Physical Exam well-developed well-nourished gentleman in no acute distress.  Alert oriented x3.  Ortho Exam examination of his right shoulder reveals near full active range of motion all planes.  He still lacks slight strength throughout.  He is neurovascular intact distally.  Left knee exam shows no effusion.  Range of motion from 0 to 120 degrees.  Stable valgus varus stress.  No joint line tenderness.  He is neurovascular intact distally.  Specialty Comments:  No specialty comments available.  Imaging: XR KNEE 3 VIEW LEFT  Result Date: 07/01/2020 X-rays demonstrate no further evidence of loosening of the tibial component compared to x-rays from 6 months ago.    PMFS History: Patient Active Problem List   Diagnosis Date Noted  . Tendinopathy of right rotator cuff 01/29/2020  .  Tendinopathy of right biceps tendon 01/29/2020  . Impingement syndrome of right shoulder 01/29/2020  . Arthrosis of right acromioclavicular joint 01/29/2020  . Chronic right shoulder pain 01/01/2020  . Status post total left knee replacement 01/01/2020  . Left-sided low back pain with left-sided sciatica 01/23/2019  . Diabetes ([place]) 01/18/2018  . History of bilateral knee replacement 02/07/2017  . Primary osteoarthritis of left knee   . BPH with obstruction/lower urinary tract symptoms 01/04/2017  . Rotator cuff syndrome of left shoulder 04/17/2016  . Nephrolithiasis 05/17/2014  . Encounter for long-term (current) use of other medications  07/27/2013  . Long term current use of anticoagulant therapy   . CAD (coronary artery disease) 04/02/2013  . Hypertensive heart disease 04/02/2013  . 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 Atrial flutter ([place])   . Nonischemic cardiomyopathy ([place])   . Obesity (BMI 30-39.9)   . History of kidney stones   . Osteoarthritis   . Hyperlipidemia    Past Medical History:  Diagnosis Date  . Anxiety   . Arthritis    everywhere  . Arthrosis of right acromioclavicular joint 01/29/2020  . Atrial flutter [facility])    Onset May 2014 Cardioversion done   . BPH associated with nocturia   . BPH with obstruction/lower urinary tract symptoms 01/04/2017  . CAD (coronary artery disease) 04/02/2013   Cath 2012 moderate mid LAD lesion, otherwise not much disease   . Coronary artery disease    cardiologist-  dr Bettina Gavia (previouly dr Wynonia Lawman)  . Diabetes ([place]) 01/18/2018  . Essential hypertension, benign   . First degree heart block   . Heart murmur   . History of bilateral knee replacement 02/07/2017  . History of bladder stone   . History of kidney stones   . History of melanoma excision    June 2016  --- s/p  excision right  leg (per pt localized and no recurrence)  . History of renal cell carcinoma 01/2017   s/p  left partial nephrecotmy  . Hypertensive heart disease 04/02/2013  . Impotence of organic origin   . Left-sided low back pain with left-sided sciatica 01/23/2019  . Mixed hyperlipidemia    slightly  . Multiple renal cysts    bilateral  . Nephrolithiasis 05/17/2014  . Nonischemic cardiomyopathy ([place])   . Osteoarthritis   . PAF (paroxysmal atrial fibrillation) ([place])    followed by cardiology  (takes ASA)  . RBBB   . 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 Right ureteral stone   . Rotator cuff syndrome of left shoulder 04/17/2016  . Status post total left knee replacement 01/01/2020  . Tendinopathy of right  biceps tendon 01/29/2020  . Tendinopathy of right rotator cuff 01/29/2020  . Type 2 diabetes mellitus treated with insulin [facility])    endocrinologist-  dr Loanne Drilling  . Wears glasses     Family History  Problem Relation Age of Onset  . Arthritis Father   . Hypertension Father   . Diabetes Father   . Arthritis Mother   . Hypertension Mother   . Heart attack Brother 33  . Diabetes Brother   . Lymphoma Paternal Grandfather   . Cancer Maternal Uncle   . Cancer Paternal Uncle        type unknown    Past Surgical History:  Procedure Laterality Date  . CARDIAC CATHETERIZATION  08-17-2011  DR Frederico Hamman [place]   POSSIBLE MODERATE LAD STENOSIS JUST AFTER  THE BIFURCATION OF LARGE DIAGONAL BRANCH/ LVEF  40-45%/ MILD GLOBAL HYPODINESIS (CATH DONE FOR ABNORMAL STRESS TEST OF FIXED LATERAL WALL DEFECT & BIFASCICULAR BLAOCK)  . CARDIOVERSION N/A 04/02/2013   Procedure: CARDIOVERSION;  Surgeon: Jacolyn Reedy, MD;  Location: [place];  Service: Cardiovascular;  Laterality: N/A;  . CYSTOSCOPY WITH LITHOLAPAXY N/A 05/18/2015   Procedure: CYSTOSCOPY WITH LITHOLAPAXY;  Surgeon: Rana Snare, MD;  Location[facility];  Service: Urology;  Laterality: N/A;  . CYSTOSCOPY WITH LITHOLAPAXY N/A 01/04/2017   Procedure: CYSTOSCOPY WITH LITHOLAPAXY, Holmium Laser;  Surgeon: Ardis Hughs, MD;  [facility];  Service: Urology;  Laterality: N/A;  . CYSTOSCOPY WITH RETROGRADE PYELOGRAM, URETEROSCOPY AND STENT PLACEMENT  11/03/2012   Procedure: CYSTOSCOPY WITH RETROGRADE PYELOGRAM, URETEROSCOPY AND STENT PLACEMENT;  Surgeon: Bernestine Amass, MD;  Location[facility];  Service: Urology;  Laterality: Left;  Cystoscopy/Left Retrograde/Ureteroscopy/Holmium Laser Litho/Double J Stent  . CYSTOSCOPY WITH URETEROSCOPY, STONE BASKETRY AND STENT PLACEMENT Right 05/15/2019   Procedure: CYSTOSCOPY WITH URETEROSCOPY, LASER LITHOTRIPSY STONE BASKETRY AND STENT PLACEMENT, RIGHT RETROGRADE;  Surgeon:  Ardis Hughs, MD;  [facility];  Service: Urology;  Laterality: Right;  . HOLMIUM LASER APPLICATION  02/40/9735   Procedure: HOLMIUM LASER APPLICATION;  Surgeon: Bernestine Amass, MD;  Location: [facility];  Service: Urology;  Laterality: Left;  . HOLMIUM LASER APPLICATION N/A 02/14/9241   Procedure: HOLMIUM LASER APPLICATION;  Surgeon: Rana Snare, MD;  Location: [facility];  Service: Urology;  Laterality: N/A;  . KNEE SURGERY Bilateral [place]  . MELANOMA EXCISION Bilateral June 2016   20th-- left leg //  1st-- right leg w/ skin graft from right thigh (no lymph node bx)  . NEPHROLITHOTOMY Right 05/17/2014   Procedure: NEPHROLITHOTOMY PERCUTANEOUS;  Surgeon: Bernestine Amass, MD;  [facility];  Service: Urology;  Laterality: Right;  . PILONIDAL CYST EXCISION    . ROBOTIC ASSITED PARTIAL NEPHRECTOMY Left 06/18/2013   Procedure: ROBOTIC ASSISTED PARTIAL NEPHRECTOMY;  Surgeon: [place] Gray, MD;  [facility];  Service: Urology;  Laterality: Left;  . ROTATOR CUFF REPAIR Left 07/2016  . SHOULDER ARTHROSCOPY WITH ROTATOR CUFF REPAIR AND SUBACROMIAL DECOMPRESSION Right 05/27/2020   Procedure: RIGHT SHOULDER ARTHROSCOPY WITH EXTENSIVE DEBRIDEMENT, DISTAL CLAVICLE EXCISION AND SUBACROMIAL DECOMPRESSION;  Surgeon: Leandrew Koyanagi, MD;  Location: [place];  Service: Orthopedics;  Laterality: Right;  . TONSILLECTOMY  75  (age 69)  . TOTAL KNEE ARTHROPLASTY Left 02/07/2017   Procedure: LEFT TOTAL KNEE ARTHROPLASTY;  Surgeon: Leandrew Koyanagi, MD;  Location: [place];  Service: Orthopedics;  Laterality: Left;  . TOTAL KNEE ARTHROPLASTY Right 04/25/2017   Procedure: RIGHT TOTAL KNEE ARTHROPLASTY;  Surgeon: Leandrew Koyanagi, MD;  Location: [place];  Service: Orthopedics;  Laterality: Right;  . TRANSURETHRAL RESECTION OF PROSTATE N/A 01/04/2017   Procedure: TRANSURETHRAL RESECTION OF THE PROSTATE (TURP);  Surgeon: Ardis Hughs, MD;   [facility];  Service: Urology;  Laterality: N/A;   Social History   Occupational History  . Occupation: Government social research officer, Psychologist, occupational  Tobacco Use  . Smoking status: Former Smoker    Packs/day: 1.00    Years: 6.00    Pack years: 6.00    Types: Cigarettes    Quit date: 10/31/1969    Years since quitting: 50.7  . Smokeless tobacco: Never Used  Vaping Use  . Vaping Use: Never used  Substance and Sexual Activity  .  Alcohol use: Yes    Alcohol/week: 0.0 standard drinks    Comment: OCCASIONAL  . Drug use: No  . Sexual activity: Never

## 2020-07-02 LAB — CBC
HCT: 45.9 % (ref 38.5–50.0)
Hemoglobin: 15.4 g/dL (ref 13.2–17.1)
MCH: 28.7 pg (ref 27.0–33.0)
MCHC: 33.6 g/dL (ref 32.0–36.0)
MCV: 85.5 fL (ref 80.0–100.0)
MPV: 11.7 fL (ref 7.5–12.5)
Platelets: 178 10*3/uL (ref 140–400)
RBC: 5.37 10*6/uL (ref 4.20–5.80)
RDW: 12.6 % (ref 11.0–15.0)
WBC: 5.5 10*3/uL (ref 3.8–10.8)

## 2020-07-02 LAB — SEDIMENTATION RATE: Sed Rate: 2 mm/h (ref 0–20)

## 2020-07-02 LAB — C-REACTIVE PROTEIN: CRP: 2.4 mg/L (ref <8.0)

## 2020-07-04 NOTE — Progress Notes (Signed)
Patient aware of results.

## 2020-07-04 NOTE — Progress Notes (Signed)
All normal. No eivdence of infection

## 2020-07-06 ENCOUNTER — Other Ambulatory Visit: Payer: Self-pay | Admitting: Endocrinology

## 2020-07-06 DIAGNOSIS — E1159 Type 2 diabetes mellitus with other circulatory complications: Secondary | ICD-10-CM

## 2020-07-06 DIAGNOSIS — Z794 Long term (current) use of insulin: Secondary | ICD-10-CM

## 2020-07-07 ENCOUNTER — Other Ambulatory Visit: Payer: Self-pay

## 2020-07-07 DIAGNOSIS — Z794 Long term (current) use of insulin: Secondary | ICD-10-CM

## 2020-07-07 DIAGNOSIS — E1159 Type 2 diabetes mellitus with other circulatory complications: Secondary | ICD-10-CM

## 2020-07-07 MED ORDER — PEN NEEDLES 30G X 8 MM MISC: 1.0000 | Freq: Every day | 0 refills | Status: DC

## 2020-08-12 ENCOUNTER — Encounter: Payer: Self-pay | Admitting: Orthopaedic Surgery

## 2020-08-12 ENCOUNTER — Ambulatory Visit: Payer: BC Managed Care – PPO | Admitting: Physician Assistant

## 2020-08-12 VITALS — Ht 71.0 in | Wt 235.0 lb

## 2020-08-12 DIAGNOSIS — Z9889 Other specified postprocedural states: Secondary | ICD-10-CM

## 2020-08-12 NOTE — Progress Notes (Signed)
Post-Op Visit Note   Patient: Matthew Schmitt           Date of Birth: Mar 03, 1951           MRN: [id number] Visit Date: 08/12/2020 PCP: Leonides Sake, MD   Assessment & Plan:  Chief Complaint:  Chief Complaint  Patient presents with  . Right Shoulder - Follow-up    Right shoulder scope 05/27/2020   Visit Diagnoses:  1. S/P arthroscopy of right shoulder     Plan: Patient is a pleasant 69 year old gentleman who comes in today 11 weeks out right shoulder arthroscopic decompression and distal clavicle excision May 27, 2020.  He has been doing very well.  He has been [place] this on his own.  He notes an occasional catch with bringing his arm into full forward flexion and abduction.  This is, however not painful. At this point, he will continue to advance with activity as tolerated.  He will follow-up with [place] as needed. Follow-Up Instructions: Return if symptoms worsen or fail to improve.   Orders:  No orders of the defined types were placed in this encounter.  No orders of the defined types were placed in this encounter.   Imaging: No new imaging  PMFS History: Patient Active Problem List   Diagnosis Date Noted  . Tendinopathy of right rotator cuff 01/29/2020  . Tendinopathy of right biceps tendon 01/29/2020  . Impingement syndrome of right shoulder 01/29/2020  . Arthrosis of right acromioclavicular joint 01/29/2020  . Chronic right shoulder pain 01/01/2020  . Status post total left knee replacement 01/01/2020  . Left-sided low back pain with left-sided sciatica 01/23/2019  . Diabetes ([place]) 01/18/2018  . History of bilateral knee replacement 02/07/2017  . Primary osteoarthritis of left knee   . BPH with obstruction/lower urinary tract symptoms 01/04/2017  . Rotator cuff syndrome of left shoulder 04/17/2016  . Nephrolithiasis 05/17/2014  . Encounter for long-term (current) use of other medications 07/27/2013  . Long term current use of anticoagulant therapy   . CAD  (coronary artery disease) 04/02/2013  . Hypertensive heart disease 04/02/2013  . 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 Atrial flutter ([place])   . Nonischemic cardiomyopathy ([place])   . Obesity (BMI 30-39.9)   . History of kidney stones   . Osteoarthritis   . Hyperlipidemia    Past Medical History:  Diagnosis Date  . Anxiety   . Arthritis    everywhere  . Arthrosis of right acromioclavicular joint 01/29/2020  . Atrial flutter [facility])    Onset May 2014 Cardioversion done   . BPH associated with nocturia   . BPH with obstruction/lower urinary tract symptoms 01/04/2017  . CAD (coronary artery disease) 04/02/2013   Cath 2012 moderate mid LAD lesion, otherwise not much disease   . Coronary artery disease    cardiologist-  dr Bettina Gavia (previouly dr Wynonia Lawman)  . Diabetes ([place]) 01/18/2018  . Essential hypertension, benign   . First degree heart block   . Heart murmur   . History of bilateral knee replacement 02/07/2017  . History of bladder stone   . History of kidney stones   . History of melanoma excision    June 2016  --- s/p  excision right  leg (per pt localized and no recurrence)  . History of renal cell carcinoma 01/2017   s/p  left partial nephrecotmy  . Hypertensive heart disease 04/02/2013  . Impotence of organic origin   . Left-sided  low back pain with left-sided sciatica 01/23/2019  . Mixed hyperlipidemia    slightly  . Multiple renal cysts    bilateral  . Nephrolithiasis 05/17/2014  . Nonischemic cardiomyopathy ([place])   . Osteoarthritis   . PAF (paroxysmal atrial fibrillation) ([place])    followed by cardiology  (takes ASA)  . RBBB   . 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 Right ureteral stone   . Rotator cuff syndrome of left shoulder 04/17/2016  . Status post total left knee replacement 01/01/2020  . Tendinopathy of right biceps tendon 01/29/2020  . Tendinopathy of right rotator cuff 01/29/2020  .  Type 2 diabetes mellitus treated with insulin [facility])    endocrinologist-  dr Loanne Drilling  . Wears glasses     Family History  Problem Relation Age of Onset  . Arthritis Father   . Hypertension Father   . Diabetes Father   . Arthritis Mother   . Hypertension Mother   . Heart attack Brother 51  . Diabetes Brother   . Lymphoma Paternal Grandfather   . Cancer Maternal Uncle   . Cancer Paternal Uncle        type unknown    Past Surgical History:  Procedure Laterality Date  . CARDIAC CATHETERIZATION  08-17-2011  DR SPENCER TILLEY   POSSIBLE MODERATE LAD STENOSIS JUST AFTER THE BIFURCATION OF LARGE DIAGONAL BRANCH/ LVEF  40-45%/ MILD GLOBAL HYPODINESIS (CATH DONE FOR ABNORMAL STRESS TEST OF FIXED LATERAL WALL DEFECT & BIFASCICULAR BLAOCK)  . CARDIOVERSION N/A 04/02/2013   Procedure: CARDIOVERSION;  Surgeon: Jacolyn Reedy, MD;  Location: [place];  Service: Cardiovascular;  Laterality: N/A;  . CYSTOSCOPY WITH LITHOLAPAXY N/A 05/18/2015   Procedure: CYSTOSCOPY WITH LITHOLAPAXY;  Surgeon: Rana Snare, MD;  Location: [facility];  Service: Urology;  Laterality: N/A;  . CYSTOSCOPY WITH LITHOLAPAXY N/A 01/04/2017   Procedure: CYSTOSCOPY WITH LITHOLAPAXY, Holmium Laser;  Surgeon: Ardis Hughs, MD;  [facility];  Service: Urology;  Laterality: N/A;  . CYSTOSCOPY WITH RETROGRADE PYELOGRAM, URETEROSCOPY AND STENT PLACEMENT  11/03/2012   Procedure: CYSTOSCOPY WITH RETROGRADE PYELOGRAM, URETEROSCOPY AND STENT PLACEMENT;  Surgeon: Bernestine Amass, MD;  Location[facility];  Service: Urology;  Laterality: Left;  Cystoscopy/Left Retrograde/Ureteroscopy/Holmium Laser Litho/Double J Stent  . CYSTOSCOPY WITH URETEROSCOPY, STONE BASKETRY AND STENT PLACEMENT Right 05/15/2019   Procedure: CYSTOSCOPY WITH URETEROSCOPY, LASER LITHOTRIPSY STONE BASKETRY AND STENT PLACEMENT, RIGHT RETROGRADE;  Surgeon: Ardis Hughs, MD;  Location[facility];  Service: Urology;   Laterality: Right;  . HOLMIUM LASER APPLICATION  20/08/711   Procedure: HOLMIUM LASER APPLICATION;  Surgeon: Bernestine Amass, MD;  Location: [facility];  Service: Urology;  Laterality: Left;  . HOLMIUM LASER APPLICATION N/A 1/97/5883   Procedure: HOLMIUM LASER APPLICATION;  Surgeon: Rana Snare, MD;  Location: [facility];  Service: Urology;  Laterality: N/A;  . KNEE SURGERY Bilateral [place]  . MELANOMA EXCISION Bilateral June 2016   20th-- left leg //  1st-- right leg w/ skin graft from right thigh (no lymph node bx)  . NEPHROLITHOTOMY Right 05/17/2014   Procedure: NEPHROLITHOTOMY PERCUTANEOUS;  Surgeon: Bernestine Amass, MD;  [facility];  Service: Urology;  Laterality: Right;  . PILONIDAL CYST EXCISION    . ROBOTIC ASSITED PARTIAL NEPHRECTOMY Left 06/18/2013   Procedure: ROBOTIC ASSISTED PARTIAL NEPHRECTOMY;  Surgeon: Dutch Gray, MD;  [facility];  Service: Urology;  Laterality: Left;  . ROTATOR CUFF REPAIR Left 07/2016  . SHOULDER ARTHROSCOPY WITH ROTATOR CUFF REPAIR AND SUBACROMIAL DECOMPRESSION Right 05/27/2020   Procedure: RIGHT SHOULDER ARTHROSCOPY WITH EXTENSIVE DEBRIDEMENT, DISTAL CLAVICLE EXCISION AND SUBACROMIAL DECOMPRESSION;  Surgeon: Leandrew Koyanagi, MD;  Location: [place];  Service: Orthopedics;  Laterality: Right;  . TONSILLECTOMY  69  (age 60)  . TOTAL KNEE ARTHROPLASTY Left 02/07/2017   Procedure: LEFT TOTAL KNEE ARTHROPLASTY;  Surgeon: Leandrew Koyanagi, MD;  Location: [place];  Service: Orthopedics;  Laterality: Left;  . TOTAL KNEE ARTHROPLASTY Right 04/25/2017   Procedure: RIGHT TOTAL KNEE ARTHROPLASTY;  Surgeon: Leandrew Koyanagi, MD;  Location: [place];  Service: Orthopedics;  Laterality: Right;  . TRANSURETHRAL RESECTION OF PROSTATE N/A 01/04/2017   Procedure: TRANSURETHRAL RESECTION OF THE PROSTATE (TURP);  Surgeon: Ardis Hughs, MD;  [facility];  Service: Urology;  Laterality: N/A;   Social History    Occupational History  . Occupation: Government social research officer, Psychologist, occupational  Tobacco Use  . Smoking status: Former Smoker    Packs/day: 1.00    Years: 6.00    Pack years: 6.00    Types: Cigarettes    Quit date: 10/31/1969    Years since quitting: 50.8  . Smokeless tobacco: Never Used  Vaping Use  . Vaping Use: Never used  Substance and Sexual Activity  . Alcohol use: Yes    Alcohol/week: 0.0 standard drinks    Comment: OCCASIONAL  . Drug use: No  . Sexual activity: Never

## 2020-08-19 ENCOUNTER — Other Ambulatory Visit: Payer: Self-pay

## 2020-08-19 ENCOUNTER — Encounter: Payer: Self-pay | Admitting: Endocrinology

## 2020-08-19 ENCOUNTER — Ambulatory Visit (INDEPENDENT_AMBULATORY_CARE_PROVIDER_SITE_OTHER): Payer: BC Managed Care – PPO | Admitting: Endocrinology

## 2020-08-19 VITALS — BP 144/80 | HR 69 | Ht 71.0 in | Wt 233.0 lb

## 2020-08-19 DIAGNOSIS — E1159 Type 2 diabetes mellitus with other circulatory complications: Secondary | ICD-10-CM

## 2020-08-19 DIAGNOSIS — Z794 Long term (current) use of insulin: Secondary | ICD-10-CM | POA: Diagnosis not present

## 2020-08-19 LAB — POCT GLYCOSYLATED HEMOGLOBIN (HGB A1C): Hemoglobin A1C: 6.7 % — AB (ref 4.0–5.6)

## 2020-08-19 MED ORDER — BASAGLAR KWIKPEN 100 UNIT/ML ~~LOC~~ SOPN: 70.0000 [IU] | PEN_INJECTOR | Freq: Every day | SUBCUTANEOUS | 3 refills | Status: DC

## 2020-08-19 MED ORDER — TRULICITY 4.5 MG/0.5ML ~~LOC~~ SOAJ: 4.5000 mg | SUBCUTANEOUS | 3 refills | Status: DC

## 2020-08-19 NOTE — Patient Instructions (Addendum)
Your blood pressure is high today.  Please see your primary care provider soon, to have it rechecked Please reduce the insulin to 70 units each morning, and:  I have sent a prescription to your pharmacy, to continue the same Trulicity.  check your blood sugar twice a day.  vary the time of day when you check, between before the 3 meals, and at bedtime.  also check if you have symptoms of your blood sugar being too high or too low.  please keep a record of the readings and bring it to your next appointment here (or you can bring the meter itself).  You can write it on any piece of paper.  please call us sooner if your blood sugar goes below 70, or if you have a lot of readings over 200. Please come back for a follow-up appointment in 4 months.

## 2020-08-19 NOTE — Progress Notes (Signed)
Subjective:    Patient ID: Matthew Schmitt, male    DOB: 06-19-1951, 69 y.o.   MRN: [id number]  HPI Pt returns for f/u of diabetes mellitus: DM type: Insulin-requiring type 2. Dx'ed: 2002.   Complications: renal insuff, leg ulcers, and CAD.  Therapy: insulin since 7262 and Trulicity.   DKA: never.   Severe hypoglycemia: once, in 2017.  Pancreatitis: never.   Other: he declines multiple daily injections;.  In early 2017, he requested to change levemir to lantus, due to high dosage requirement at the time; fructosamine has showed better glycemic control than A1c; He did not tolerate Farxiga (cramps).  Interval history: He brings a record of his fasting cbg's which I have reviewed today.  they from 129-180.  No recent steroids.  pt states he feels well in general.  He takes 75 units qam.   Past Medical History:  Diagnosis Date  . Anxiety   . Arthritis    everywhere  . Arthrosis of right acromioclavicular joint 01/29/2020  . Atrial flutter [facility])    Onset May 2014 Cardioversion done   . BPH associated with nocturia   . BPH with obstruction/lower urinary tract symptoms 01/04/2017  . CAD (coronary artery disease) 04/02/2013   Cath 2012 moderate mid LAD lesion, otherwise not much disease   . Coronary artery disease    cardiologist-  dr Bettina Gavia (previouly dr Wynonia Lawman)  . Diabetes ([place]) 01/18/2018  . Essential hypertension, benign   . First degree heart block   . Heart murmur   . History of bilateral knee replacement 02/07/2017  . History of bladder stone   . History of kidney stones   . History of melanoma excision    June 2016  --- s/p  excision right  leg (per pt localized and no recurrence)  . History of renal cell carcinoma 01/2017   s/p  left partial nephrecotmy  . Hypertensive heart disease 04/02/2013  . Impotence of organic origin   . Left-sided low back pain with left-sided sciatica 01/23/2019  . Mixed hyperlipidemia    slightly  . Multiple renal cysts    bilateral  .  Nephrolithiasis 05/17/2014  . Nonischemic cardiomyopathy ([place])   . Osteoarthritis   . PAF (paroxysmal atrial fibrillation) ([place])    followed by cardiology  (takes ASA)  . RBBB   . 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 Right ureteral stone   . Rotator cuff syndrome of left shoulder 04/17/2016  . Status post total left knee replacement 01/01/2020  . Tendinopathy of right biceps tendon 01/29/2020  . Tendinopathy of right rotator cuff 01/29/2020  . Type 2 diabetes mellitus treated with insulin [facility])    endocrinologist-  dr Loanne Drilling  . Wears glasses     Past Surgical History:  Procedure Laterality Date  . CARDIAC CATHETERIZATION  08-17-2011  DR SPENCER TILLEY   POSSIBLE MODERATE LAD STENOSIS JUST AFTER THE BIFURCATION OF LARGE DIAGONAL BRANCH/ LVEF  40-45%/ MILD GLOBAL HYPODINESIS (CATH DONE FOR ABNORMAL STRESS TEST OF FIXED LATERAL WALL DEFECT & BIFASCICULAR BLAOCK)  . CARDIOVERSION N/A 04/02/2013   Procedure: CARDIOVERSION;  Surgeon: Jacolyn Reedy, MD;  Location: [place];  Service: Cardiovascular;  Laterality: N/A;  . CYSTOSCOPY WITH LITHOLAPAXY N/A 05/18/2015   Procedure: CYSTOSCOPY WITH LITHOLAPAXY;  Surgeon: Rana Snare, MD;  Location: [facility];  Service: Urology;  Laterality: N/A;  . CYSTOSCOPY WITH LITHOLAPAXY N/A 01/04/2017   Procedure: CYSTOSCOPY WITH LITHOLAPAXY, Holmium Laser;  Surgeon:  Ardis Hughs, MD;  [facility];  Service: Urology;  Laterality: N/A;  . CYSTOSCOPY WITH RETROGRADE PYELOGRAM, URETEROSCOPY AND STENT PLACEMENT  11/03/2012   Procedure: CYSTOSCOPY WITH RETROGRADE PYELOGRAM, URETEROSCOPY AND STENT PLACEMENT;  Surgeon: Bernestine Amass, MD;  Location: [facility];  Service: Urology;  Laterality: Left;  Cystoscopy/Left Retrograde/Ureteroscopy/Holmium Laser Litho/Double J Stent  . CYSTOSCOPY WITH URETEROSCOPY, STONE BASKETRY AND STENT PLACEMENT Right 05/15/2019   Procedure: CYSTOSCOPY WITH  URETEROSCOPY, LASER LITHOTRIPSY STONE BASKETRY AND STENT PLACEMENT, RIGHT RETROGRADE;  Surgeon: Ardis Hughs, MD;  Location: [facility];  Service: Urology;  Laterality: Right;  . HOLMIUM LASER APPLICATION  91/47/8295   Procedure: HOLMIUM LASER APPLICATION;  Surgeon: Bernestine Amass, MD;  Location[facility];  Service: Urology;  Laterality: Left;  . HOLMIUM LASER APPLICATION N/A 05/09/3085   Procedure: HOLMIUM LASER APPLICATION;  Surgeon: Rana Snare, MD;  Location: [facility];  Service: Urology;  Laterality: N/A;  . KNEE SURGERY Bilateral [place]  . MELANOMA EXCISION Bilateral June 2016   20th-- left leg //  1st-- right leg w/ skin graft from right thigh (no lymph node bx)  . NEPHROLITHOTOMY Right 05/17/2014   Procedure: NEPHROLITHOTOMY PERCUTANEOUS;  Surgeon: Bernestine Amass, MD;  [facility];  Service: Urology;  Laterality: Right;  . PILONIDAL CYST EXCISION    . ROBOTIC ASSITED PARTIAL NEPHRECTOMY Left 06/18/2013   Procedure: ROBOTIC ASSISTED PARTIAL NEPHRECTOMY;  Surgeon: [place] Gray, MD;  [facility];  Service: Urology;  Laterality: Left;  . ROTATOR CUFF REPAIR Left 07/2016  . SHOULDER ARTHROSCOPY WITH ROTATOR CUFF REPAIR AND SUBACROMIAL DECOMPRESSION Right 05/27/2020   Procedure: RIGHT SHOULDER ARTHROSCOPY WITH EXTENSIVE DEBRIDEMENT, DISTAL CLAVICLE EXCISION AND SUBACROMIAL DECOMPRESSION;  Surgeon: Leandrew Koyanagi, MD;  Location: [place];  Service: Orthopedics;  Laterality: Right;  . TONSILLECTOMY  60  (age 35)  . TOTAL KNEE ARTHROPLASTY Left 02/07/2017   Procedure: LEFT TOTAL KNEE ARTHROPLASTY;  Surgeon: Leandrew Koyanagi, MD;  Location: [place];  Service: Orthopedics;  Laterality: Left;  . TOTAL KNEE ARTHROPLASTY Right 04/25/2017   Procedure: RIGHT TOTAL KNEE ARTHROPLASTY;  Surgeon: Leandrew Koyanagi, MD;  Location: [place];  Service: Orthopedics;  Laterality: Right;  . TRANSURETHRAL RESECTION OF PROSTATE N/A 01/04/2017    Procedure: TRANSURETHRAL RESECTION OF THE PROSTATE (TURP);  Surgeon: Ardis Hughs, MD;  [facility];  Service: Urology;  Laterality: N/A;    Social History   Socioeconomic History  . Marital status: Married    Spouse name: Not on file  . Number of children: 2  . Years of education: 67  . Highest education level: Not on file  Occupational History  . Occupation: Government social research officer, Psychologist, occupational  Tobacco Use  . Smoking status: Former Smoker    Packs/day: 1.00    Years: 6.00    Pack years: 6.00    Types: Cigarettes    Quit date: 10/31/1969    Years since quitting: 50.8  . Smokeless tobacco: Never Used  Vaping Use  . Vaping Use: Never used  Substance and Sexual Activity  . Alcohol use: Yes    Alcohol/week: 0.0 standard drinks    Comment: OCCASIONAL  . Drug use: No  . Sexual activity: Never  Other Topics Concern  . Not on file  Social History Narrative   Regular exercise-no   Social Determinants of Health   Financial Resource Strain:   . Difficulty of Paying  Living Expenses: Not on file  Food Insecurity:   . Worried About Charity fundraiser in the Last Year: Not on file  . Ran Out of Food in the Last Year: Not on file  Transportation Needs:   . Lack of Transportation (Medical): Not on file  . Lack of Transportation (Non-Medical): Not on file  Physical Activity:   . Days of Exercise per Week: Not on file  . Minutes of Exercise per Session: Not on file  Stress:   . Feeling of Stress : Not on file  Social Connections:   . Frequency of Communication with Friends and Family: Not on file  . Frequency of Social Gatherings with Friends and Family: Not on file  . Attends Religious Services: Not on file  . Active Member of Clubs or Organizations: Not on file  . Attends Archivist Meetings: Not on file  . Marital Status: Not on file  Intimate Partner Violence:   . Fear of Current or Ex-Partner: Not on file  . Emotionally Abused: Not on file  .  Physically Abused: Not on file  . Sexually Abused: Not on file    Current Outpatient Medications on File Prior to Visit  Medication Sig Dispense Refill  . amLODIPine-Valsartan-HCTZ 10-320-25 MG TABS Take by mouth.    Marland Kitchen aspirin EC 81 MG tablet Take 81 mg by mouth daily.    . carvedilol (COREG) 25 MG tablet Take 25 mg by mouth every evening.    Marland Kitchen glucose blood (ONETOUCH ULTRA) test strip 1 each by Other route 2 (two) times daily. E11.9 200 strip 3  . Insulin Pen Needle (PEN NEEDLES) 30G X 8 MM MISC 1 each by Does not apply route daily. E11.9 90 each 0  . Lancets (ONETOUCH DELICA PLUS [id number]) MISC 1 each by Other route 2 (two) times daily. E11.9 200 each 3  . Menthol, Topical Analgesic, (BENGAY EX) Apply 1 application topically daily as needed (pain).    . methocarbamol (ROBAXIN) 750 MG tablet Take 1 tablet (750 mg total) by mouth 2 (two) times daily as needed for muscle spasms. 20 tablet 3  . ondansetron (ZOFRAN) 4 MG tablet Take 1-2 tablets (4-8 mg total) by mouth every 8 (eight) hours as needed for nausea or vomiting. 20 tablet 0  . oxyCODONE-acetaminophen (PERCOCET) 5-325 MG tablet Take 1-2 tablets by mouth every 8 (eight) hours as needed for severe pain. 30 tablet 0  . sildenafil (VIAGRA) 100 MG tablet Take 100 mg by mouth daily as needed.  2  . rosuvastatin (CRESTOR) 10 MG tablet Take 1 tablet (10 mg total) by mouth daily. 90 tablet 1   No current facility-administered medications on file prior to visit.    Allergies  Allergen Reactions  . Actos [Pioglitazone] Swelling and Cough    SWELLING REACTION UNSPECIFIED  EDEMA    Family History  Problem Relation Age of Onset  . Arthritis Father   . Hypertension Father   . Diabetes Father   . Arthritis Mother   . Hypertension Mother   . Heart attack Brother 86  . Diabetes Brother   . Lymphoma Paternal Grandfather   . Cancer Maternal Uncle   . Cancer Paternal Uncle        type unknown    BP (!) 144/80   Pulse 69   Ht 5\' 11"   (1.803 m)   Wt 233 lb (105.7 kg)   SpO2 98%   BMI 32.50 kg/m    Review of Systems He  denies hypoglycemia    Objective:   Physical Exam VITAL SIGNS:  See vs page GENERAL: no distress Pulses: dorsalis pedis intact bilat.   MSK: no deformity of the feet CV: no leg edema Skin:  no ulcer on the feet.  normal color and temp on the feet, but there is hyperpigmentation of the legs.   Neuro: sensation is intact to touch on the feet Ext: there is bilateral onychomycosis of the toenails.  Both great toenails are absent.     Lab Results  Component Value Date   CREATININE 1.10 05/25/2020   BUN 20 05/25/2020   NA 139 05/25/2020   K 3.4 (L) 05/25/2020   CL 104 05/25/2020   CO2 24 05/25/2020   Lab Results  Component Value Date   HGBA1C 6.7 (A) 08/19/2020        Assessment & Plan:  Insulin-requiring type 2 DM, with CRI: overcontrolled, given this regimen, which does not match insulin to his changing needs throughout the day.  He declines to add SGLT HTN: is noted today  Patient Instructions  Your blood pressure is high today.  Please see your primary care provider soon, to have it rechecked Please reduce the insulin to 70 units each morning, and:  I have sent a prescription to your pharmacy, to continue the same Trulicity.  check your blood sugar twice a day.  vary the time of day when you check, between before the 3 meals, and at bedtime.  also check if you have symptoms of your blood sugar being too high or too low.  please keep a record of the readings and bring it to your next appointment here (or you can bring the meter itself).  You can write it on any piece of paper.  please call us sooner if your blood sugar goes below 70, or if you have a lot of readings over 200. Please come back for a follow-up appointment in 4 months.

## 2020-08-26 ENCOUNTER — Ambulatory Visit: Payer: BC Managed Care – PPO | Admitting: Endocrinology

## 2020-08-31 DIAGNOSIS — Z03818 Encounter for observation for suspected exposure to other biological agents ruled out: Secondary | ICD-10-CM | POA: Diagnosis not present

## 2020-09-03 DIAGNOSIS — Z03818 Encounter for observation for suspected exposure to other biological agents ruled out: Secondary | ICD-10-CM | POA: Diagnosis not present

## 2020-12-01 DIAGNOSIS — M5416 Radiculopathy, lumbar region: Secondary | ICD-10-CM | POA: Diagnosis not present

## 2020-12-01 DIAGNOSIS — Z6831 Body mass index (BMI) 31.0-31.9, adult: Secondary | ICD-10-CM | POA: Diagnosis not present

## 2020-12-05 IMAGING — MR MRI LUMBAR SPINE WITHOUT CONTRAST
4 of 5 series · 17 of 48 positions shown · non-contrast
Comparison: Previous CT and radiography. Most recent radiography
01/23/2019

CLINICAL DATA: Low back pain radiating to both buttocks for the
last 6-8 months.

EXAM:
MRI LUMBAR SPINE WITHOUT CONTRAST
TECHNIQUE: Multiplanar, multisequence MR imaging of the lumbar spine was
performed. No intravenous contrast was administered.

[Series 6: T2 · sagittal · 4.0mm · 0.73mm/px · 5 of 15 slices shown (1 of 2)]
[im 1/15]
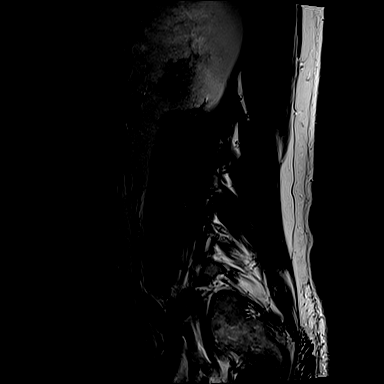
[im 4/15]
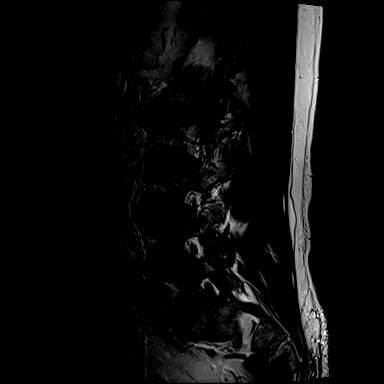
[im 8/15]
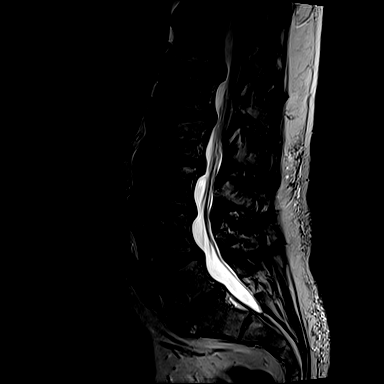
[im 11/15]
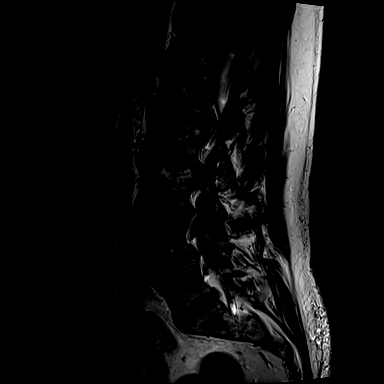
[im 15/15]
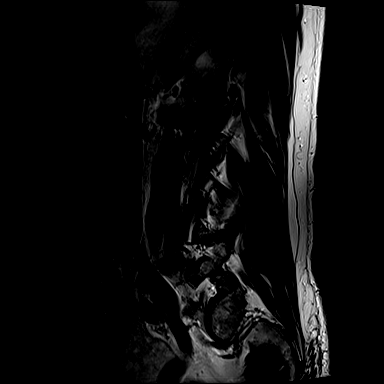

[Series 7: T1 · sagittal · 4.0mm · 0.73mm/px · 3 of 15 slices shown (1 of 2)]
[im 1/15]
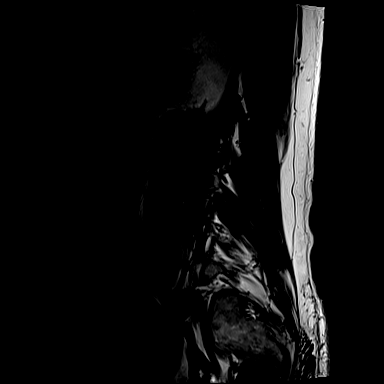
[im 8/15]
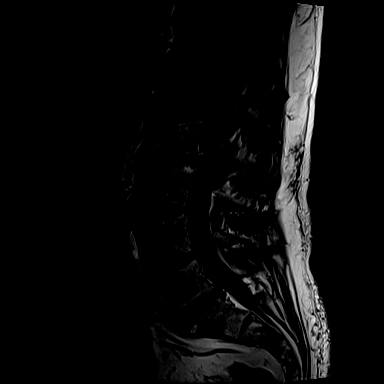
[im 15/15]
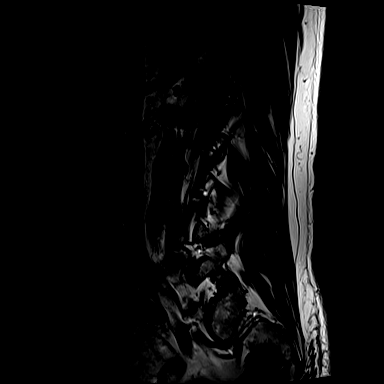

[Series 13: T2 · axial · 4.0mm · 0.28mm/px · z∈[-46,+137]mm · 6 of 42 slices shown (2 of 2)]
[im 3/42]
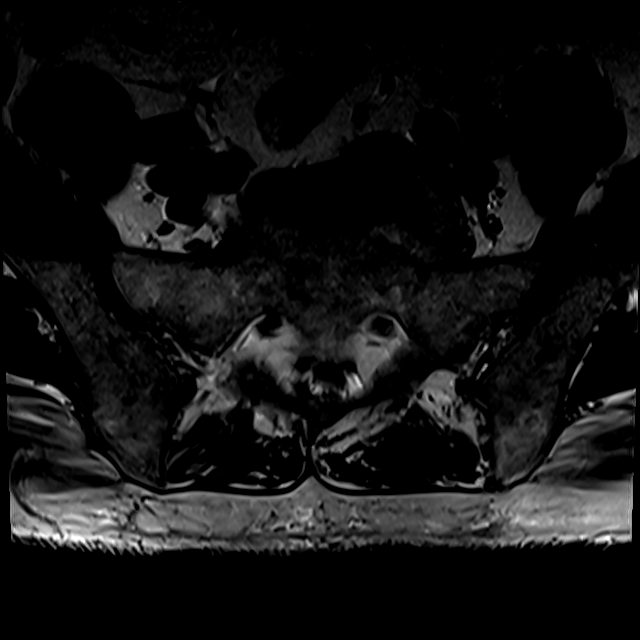
[im 6/42]
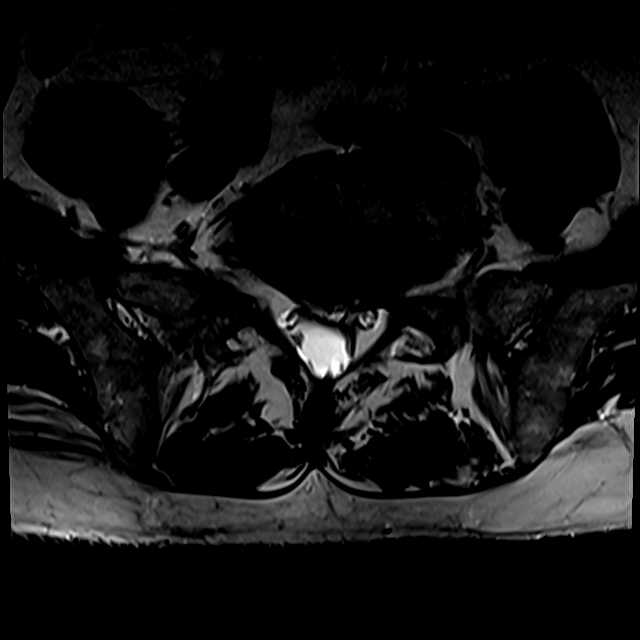
[im 9/42]
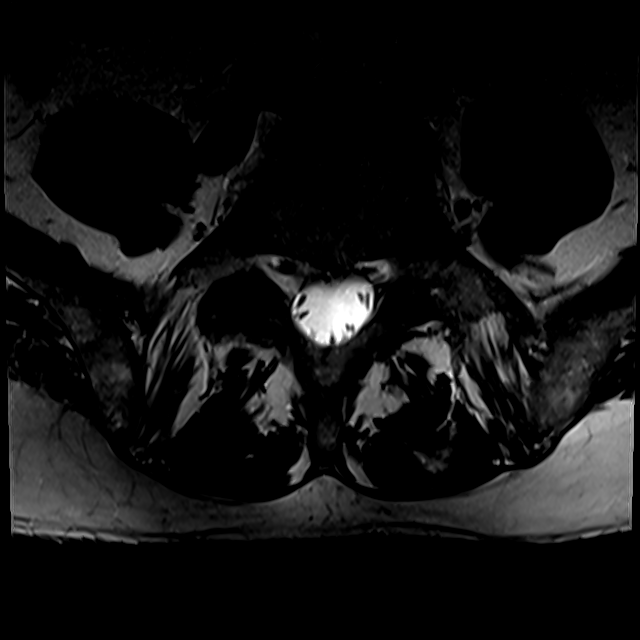
[im 14/42]
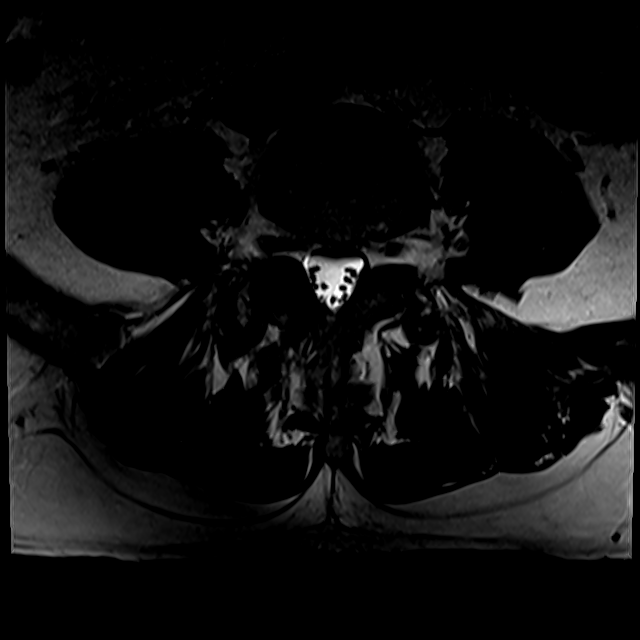
[im 22/42]
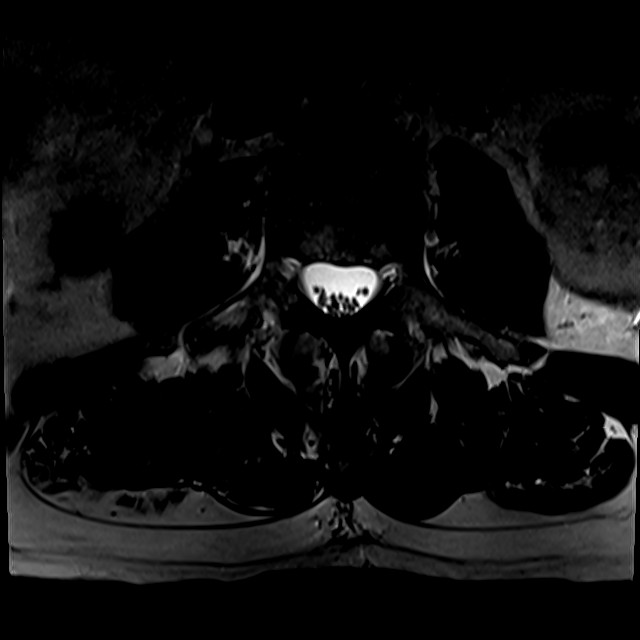
[im 36/42]
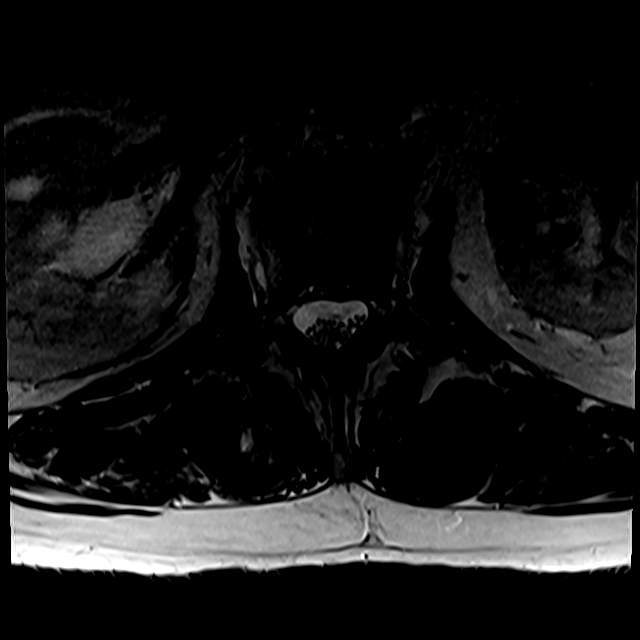

[Series 100: T1 · axial · 4.0mm · 0.28mm/px · z∈[-31,+137]mm · 3 of 42 slices shown (2 of 2)]
[im 6/42]
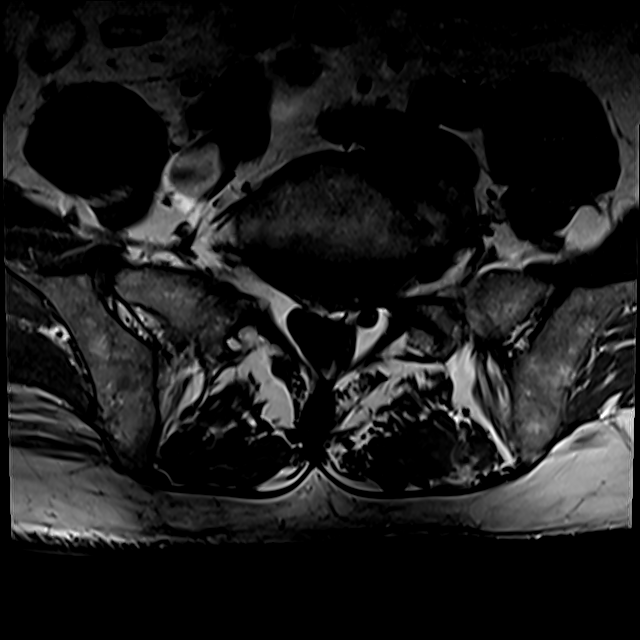
[im 22/42]
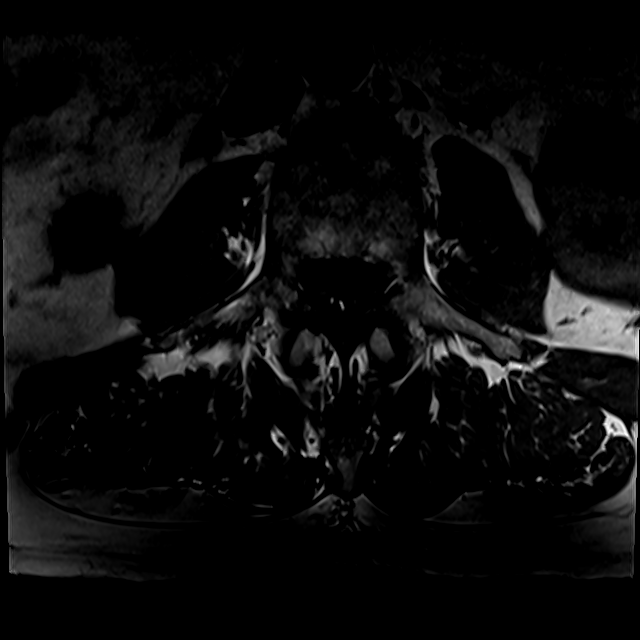
[im 36/42]
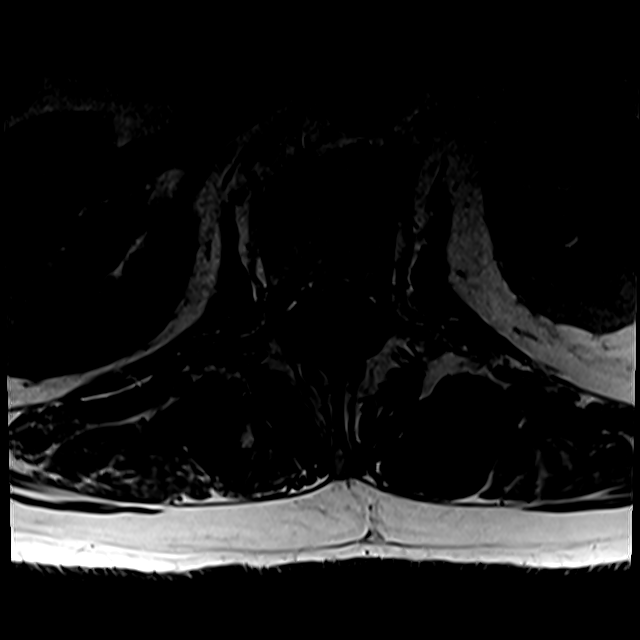

[17 of 48 positions shown; findings below may reference images not displayed]

FINDINGS: Segmentation: Some transitional features. The lowest level is
labeled as L5. Correlation with this numbering scheme would be
important should intervention be contemplated.

Alignment:  2 mm degenerative anterolisthesis L4-5.

Vertebrae:  No fracture or primary bone lesion.

Conus medullaris and cauda equina: Conus extends to the L1 level.
Conus and cauda equina appear normal.

Paraspinal and other soft tissues: Hydroureteronephrosis on the
right. This patient has a history of renal stone disease and this
may be active and responsible for the right-sided obstruction
presently.

Disc levels:

T12-L1: Left posterolateral disc herniation indents the thecal sac
but does not cause visible neural compression.

L1-2: Broad-based disc herniation more prominent towards the right.
Stenosis of the lateral recesses right more than left. Right-sided
neural compression would be possible in this location.

L2-3: Disc degeneration with endplate osteophytes and central
prominent disc herniation. This indents the thecal sac but does not
cause visible neural compression.

L3-4: Shallow disc protrusion. Mild facet and ligamentous
hypertrophy. Mild narrowing of the lateral recesses without visible
neural compression.

L4-5: Facet osteoarthritis with 2 mm of anterolisthesis. Shallow
protrusion of the disc. Mild stenosis of both lateral recesses
without definite neural compression.

L5-S1: Endplate osteophytes and disc herniation more prominent in
the left posterolateral direction. Mild facet degeneration and
hypertrophy. Narrowing of the subarticular lateral recess and
proximal foramen on the left that could possibly cause left-sided
neural compression.
IMPRESSION: Hydroureteronephrosis on the right. This patient has a history of
renal stone disease. Evaluation recommended if not underway.

L1-2: Broad-based, right posterolateral predominant disc herniation.
Lateral recess stenosis bilaterally, more severe on the right, which
could cause right-sided neural compression.

T12-L1: Left posterolateral disc herniation indents the thecal sac
but does not cause visible neural compression.

L2-3: Endplate osteophytes and central disc herniation indents the
thecal sac but does not cause visible neural compression.

L3-4: Shallow disc protrusion. Facet hypertrophy. Mild lateral
recess narrowing without visible neural compression.

L4-5: 2 mm anterolisthesis due to facet arthropathy. Shallow disc
protrusion. Mild stenosis of both lateral recesses without visible
neural compression.

L5-S1: Osteophytes and left posterolateral predominant disc
herniation. Mild facet hypertrophy. Narrowing of the subarticular
lateral recess on the left and proximal foramen that could possibly
cause left-sided neural compression.

These results will be called to the ordering clinician or
representative by the Radiologist Assistant, and communication
documented in the PACS or zVision Dashboard, primarily because of
the presumably unexpected right hydronephrosis finding.

## 2020-12-07 DIAGNOSIS — M5416 Radiculopathy, lumbar region: Secondary | ICD-10-CM | POA: Diagnosis not present

## 2020-12-07 DIAGNOSIS — K432 Incisional hernia without obstruction or gangrene: Secondary | ICD-10-CM | POA: Diagnosis not present

## 2020-12-07 DIAGNOSIS — Z6833 Body mass index (BMI) 33.0-33.9, adult: Secondary | ICD-10-CM | POA: Diagnosis not present

## 2020-12-16 ENCOUNTER — Other Ambulatory Visit: Payer: Self-pay

## 2020-12-16 DIAGNOSIS — I480 Paroxysmal atrial fibrillation: Secondary | ICD-10-CM | POA: Insufficient documentation

## 2020-12-16 DIAGNOSIS — I10 Essential (primary) hypertension: Secondary | ICD-10-CM | POA: Insufficient documentation

## 2020-12-16 DIAGNOSIS — Z973 Presence of spectacles and contact lenses: Secondary | ICD-10-CM | POA: Insufficient documentation

## 2020-12-16 DIAGNOSIS — I4510 Unspecified right bundle-branch block: Secondary | ICD-10-CM | POA: Insufficient documentation

## 2020-12-16 DIAGNOSIS — I2510 Atherosclerotic heart disease of native coronary artery without angina pectoris: Secondary | ICD-10-CM | POA: Insufficient documentation

## 2020-12-16 DIAGNOSIS — N529 Male erectile dysfunction, unspecified: Secondary | ICD-10-CM | POA: Insufficient documentation

## 2020-12-16 DIAGNOSIS — R011 Cardiac murmur, unspecified: Secondary | ICD-10-CM | POA: Insufficient documentation

## 2020-12-16 DIAGNOSIS — F419 Anxiety disorder, unspecified: Secondary | ICD-10-CM | POA: Insufficient documentation

## 2020-12-16 DIAGNOSIS — N401 Enlarged prostate with lower urinary tract symptoms: Secondary | ICD-10-CM | POA: Insufficient documentation

## 2020-12-16 DIAGNOSIS — Z87448 Personal history of other diseases of urinary system: Secondary | ICD-10-CM | POA: Insufficient documentation

## 2020-12-16 DIAGNOSIS — I440 Atrioventricular block, first degree: Secondary | ICD-10-CM | POA: Insufficient documentation

## 2020-12-16 DIAGNOSIS — Q6102 Congenital multiple renal cysts: Secondary | ICD-10-CM | POA: Insufficient documentation

## 2020-12-16 DIAGNOSIS — Z9889 Other specified postprocedural states: Secondary | ICD-10-CM | POA: Insufficient documentation

## 2020-12-16 DIAGNOSIS — Z794 Long term (current) use of insulin: Secondary | ICD-10-CM | POA: Insufficient documentation

## 2020-12-16 DIAGNOSIS — E119 Type 2 diabetes mellitus without complications: Secondary | ICD-10-CM | POA: Insufficient documentation

## 2020-12-16 DIAGNOSIS — Z87442 Personal history of urinary calculi: Secondary | ICD-10-CM | POA: Insufficient documentation

## 2020-12-16 DIAGNOSIS — M1990 Unspecified osteoarthritis, unspecified site: Secondary | ICD-10-CM | POA: Insufficient documentation

## 2020-12-16 DIAGNOSIS — N201 Calculus of ureter: Secondary | ICD-10-CM | POA: Insufficient documentation

## 2020-12-17 NOTE — Progress Notes (Signed)
Cardiology Office Note:    Date:  12/19/2020   ID:  Matthew Schmitt, DOB [birth year], MRN [id number]  PCP:  Leonides Sake, MD  Cardiologist:  Shirlee More, MD    Referring MD: Leonides Sake, MD    ASSESSMENT:    1. Atypical atrial flutter ([place])   2. Coronary artery disease of native artery of native heart with stable angina pectoris ([place])   3. PAF (paroxysmal atrial fibrillation) ([place])   4. Hypertensive heart disease without heart failure   5. Hyperlipidemia   6. RBBB (right bundle branch block with left anterior fascicular block)   7. Shortness of breath    PLAN:    In order of problems listed above:  1. Although he is unaware he is in atypical atrial flutter with 2-1 conduction.  He has a history of this.  I will place him back on anticoagulant I told him his hematuria worsens to let me know we will follow-up in 2 to 3 weeks resume a beta-blocker for rate control and make plans for cardioversion. 2. Stable CAD [place] Heart Association class I continue current medical treatment resuming beta-blocker high intensity statin check labs today 3. Poorly controlled resume calcium channel blocker ARB thiazide diuretic trend blood pressures at home contact me if remains greater than 140 4. Resume his statin 5. Stable conduction disease     Next appointment: 3 weeks   Medication Adjustments/Labs and Tests Ordered: Current medicines are reviewed at length with the patient today.  Concerns regarding medicines are outlined above.  Orders Placed This Encounter  Procedures  . Lipid panel  . Pro b natriuretic peptide (BNP)  . CBC  . Comprehensive metabolic panel  . EKG 12-Lead  . EKG 12-Lead   Meds ordered this encounter  Medications  . nitroGLYCERIN (NITROSTAT) 0.4 MG SL tablet    Sig: Place 1 tablet (0.4 mg total) under the tongue every 5 (five) minutes as needed for chest pain.    Dispense:  30 tablet    Refill:  3  . DISCONTD: metoprolol succinate (TOPROL-XL) 50 MG 24  hr tablet    Sig: Take 1 tablet (50 mg total) by mouth daily. Take with or immediately following a meal.    Dispense:  90 tablet    Refill:  3  . valsartan-hydrochlorothiazide (DIOVAN-HCT) 320-25 MG tablet    Sig: Take 1 tablet by mouth daily.    Dispense:  90 tablet    Refill:  3  . amLODipine (NORVASC) 10 MG tablet    Sig: Take 1 tablet (10 mg total) by mouth daily.    Dispense:  90 tablet    Refill:  3  . rosuvastatin (CRESTOR) 10 MG tablet    Sig: Take 1 tablet (10 mg total) by mouth daily.    Dispense:  90 tablet    Refill:  3  . metoprolol tartrate (LOPRESSOR) 50 MG tablet    Sig: Take 1 tablet (50 mg total) by mouth 2 (two) times daily.    Dispense:  180 tablet    Refill:  3    No chief complaint on file.   History of Present Illness:    Matthew Schmitt is a 70 y.o. male with a hx of CAD moderate involving the mid LAD treated medically at left heart catheterization 2014 and ejection fraction 55% echocardiogram in 2014 atrial flutter with previous cardioversion hypertension type 2 diabetes mellitus hyperlipidemia and bifascicular heart block with right bundle branch block and left  anterior hemiblock.  He was last seen 05/19/2020 prior to right shoulder surgery.  Compliance with diet, lifestyle and medications: No he has been out of his medications up to 1 month.  His concern is renewal medications and going over them to titrate the medications Home blood pressure has been running 160/80 or greater. He complains of radicular pain of the right lower extremity and has received injections.  He is not having claudication.  He has marked stasis changes lower extremities complains of cool feet but he has strong pedal pulses bilaterally. He is in atrial flutter with 2 1 conduction heart rate of 160 and is unaware without palpitation fatigue syncope shortness of breath or chest pain. He has chronic low level hematuria from renal calculi. Past Medical History:  Diagnosis Date  .  Anxiety   . Arthritis    everywhere  . Arthrosis of right acromioclavicular joint 01/29/2020  . Atrial flutter [facility])    Onset May 2014 Cardioversion done   . BPH associated with nocturia   . BPH with obstruction/lower urinary tract symptoms 01/04/2017  . CAD (coronary artery disease) 04/02/2013   Cath 2012 moderate mid LAD lesion, otherwise not much disease   . Coronary artery disease    cardiologist-  dr Bettina Gavia (previouly dr Wynonia Lawman)  . Diabetes ([place]) 01/18/2018  . Encounter for long-term (current) use of other medications 07/27/2013  . Essential hypertension, benign   . First degree heart block   . Heart murmur   . History of bilateral knee replacement 02/07/2017  . History of bladder stone   . History of kidney stones   . History of melanoma excision    June 2016  --- s/p  excision right  leg (per pt localized and no recurrence)  . History of renal cell carcinoma 01/2017   s/p  left partial nephrecotmy  . Hypertensive heart disease 04/02/2013  . Impotence of organic origin   . Left-sided low back pain with left-sided sciatica 01/23/2019  . Long term current use of anticoagulant therapy   . Mixed hyperlipidemia    slightly  . Multiple renal cysts    bilateral  . Nephrolithiasis 05/17/2014  . Nonischemic cardiomyopathy ([place])   . Osteoarthritis   . PAF (paroxysmal atrial fibrillation) ([place])    followed by cardiology  (takes ASA)  . RBBB   . 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 Right ureteral stone   . Rotator cuff syndrome of left shoulder 04/17/2016  . Status post total left knee replacement 01/01/2020  . Tendinopathy of right biceps tendon 01/29/2020  . Tendinopathy of right rotator cuff 01/29/2020  . Type 2 diabetes mellitus treated with insulin [facility])    endocrinologist-  dr Loanne Drilling  . Wears glasses     Past Surgical History:  Procedure Laterality Date  . CARDIAC CATHETERIZATION  08-17-2011  DR SPENCER TILLEY   POSSIBLE MODERATE LAD  STENOSIS JUST AFTER THE BIFURCATION OF LARGE DIAGONAL BRANCH/ LVEF  40-45%/ MILD GLOBAL HYPODINESIS (CATH DONE FOR ABNORMAL STRESS TEST OF FIXED LATERAL WALL DEFECT & BIFASCICULAR BLAOCK)  . CARDIOVERSION N/A 04/02/2013   Procedure: CARDIOVERSION;  Surgeon: Jacolyn Reedy, MD;  Location: [place];  Service: Cardiovascular;  Laterality: N/A;  . CYSTOSCOPY WITH LITHOLAPAXY N/A 05/18/2015   Procedure: CYSTOSCOPY WITH LITHOLAPAXY;  Surgeon: Rana Snare, MD;  [facility];  Service: Urology;  Laterality: N/A;  . CYSTOSCOPY WITH LITHOLAPAXY N/A 01/04/2017   Procedure: CYSTOSCOPY WITH LITHOLAPAXY, Holmium Laser;  Surgeon: Ardis Hughs, MD;  Location: [facility];  Service: Urology;  Laterality: N/A;  . CYSTOSCOPY WITH RETROGRADE PYELOGRAM, URETEROSCOPY AND STENT PLACEMENT  11/03/2012   Procedure: CYSTOSCOPY WITH RETROGRADE PYELOGRAM, URETEROSCOPY AND STENT PLACEMENT;  Surgeon: Bernestine Amass, MD;  Location: [facility];  Service: Urology;  Laterality: Left;  Cystoscopy/Left Retrograde/Ureteroscopy/Holmium Laser Litho/Double J Stent  . CYSTOSCOPY WITH URETEROSCOPY, STONE BASKETRY AND STENT PLACEMENT Right 05/15/2019   Procedure: CYSTOSCOPY WITH URETEROSCOPY, LASER LITHOTRIPSY STONE BASKETRY AND STENT PLACEMENT, RIGHT RETROGRADE;  Surgeon: Ardis Hughs, MD;  Location: [facility];  Service: Urology;  Laterality: Right;  . HOLMIUM LASER APPLICATION  [id number]   Procedure: HOLMIUM LASER APPLICATION;  Surgeon: Bernestine Amass, MD;  Location: [facility];  Service: Urology;  Laterality: Left;  . HOLMIUM LASER APPLICATION N/A 0000000   Procedure: HOLMIUM LASER APPLICATION;  Surgeon: Rana Snare, MD;  Location: [facility];  Service: Urology;  Laterality: N/A;  . KNEE SURGERY Bilateral [place]  . MELANOMA EXCISION Bilateral June 2016   20th-- left leg //  1st-- right leg w/ skin graft from right thigh (no lymph node  bx)  . NEPHROLITHOTOMY Right 05/17/2014   Procedure: NEPHROLITHOTOMY PERCUTANEOUS;  Surgeon: Bernestine Amass, MD;  Location: [facility];  Service: Urology;  Laterality: Right;  . PILONIDAL CYST EXCISION    . ROBOTIC ASSITED PARTIAL NEPHRECTOMY Left 06/18/2013   Procedure: ROBOTIC ASSISTED PARTIAL NEPHRECTOMY;  Surgeon: Dutch Gray, MD;  Location: [facility];  Service: Urology;  Laterality: Left;  . ROTATOR CUFF REPAIR Left 07/2016  . SHOULDER ARTHROSCOPY WITH ROTATOR CUFF REPAIR AND SUBACROMIAL DECOMPRESSION Right 05/27/2020   Procedure: RIGHT SHOULDER ARTHROSCOPY WITH EXTENSIVE DEBRIDEMENT, DISTAL CLAVICLE EXCISION AND SUBACROMIAL DECOMPRESSION;  Surgeon: Leandrew Koyanagi, MD;  Location[place];  Service: Orthopedics;  Laterality: Right;  . TONSILLECTOMY  48  (age 69)  . TOTAL KNEE ARTHROPLASTY Left 02/07/2017   Procedure: LEFT TOTAL KNEE ARTHROPLASTY;  Surgeon: Leandrew Koyanagi, MD;  Location: [place];  Service: Orthopedics;  Laterality: Left;  . TOTAL KNEE ARTHROPLASTY Right 04/25/2017   Procedure: RIGHT TOTAL KNEE ARTHROPLASTY;  Surgeon: Leandrew Koyanagi, MD;  Location: [place];  Service: Orthopedics;  Laterality: Right;  . TRANSURETHRAL RESECTION OF PROSTATE N/A 01/04/2017   Procedure: TRANSURETHRAL RESECTION OF THE PROSTATE (TURP);  Surgeon: Ardis Hughs, MD;  Location[facility];  Service: Urology;  Laterality: N/A;    Current Medications: Current Meds  Medication Sig  . aspirin EC 81 MG tablet Take 81 mg by mouth daily.  . Dulaglutide (TRULICITY) 4.5 0000000 SOPN Inject 4.5 mg into the skin once a week.  Marland Kitchen glucose blood (ONETOUCH ULTRA) test strip 1 each by Other route 2 (two) times daily. E11.9  . Insulin Glargine (BASAGLAR KWIKPEN) 100 UNIT/ML Inject 70 Units into the skin daily with breakfast.  . Insulin Pen Needle (PEN NEEDLES) 30G X 8 MM MISC 1 each by Does not apply route daily. E11.9  . Lancets (ONETOUCH DELICA PLUS 123XX123) MISC 1 each by Other route 2 (two) times daily. E11.9   . metoprolol tartrate (LOPRESSOR) 50 MG tablet Take 1 tablet (50 mg total) by mouth 2 (two) times daily.  . nitroGLYCERIN (NITROSTAT) 0.4 MG SL tablet Place 1 tablet (0.4 mg total) under the tongue every 5 (five) minutes as needed for chest pain.  . sildenafil (VIAGRA) 100 MG tablet Take 100 mg by mouth daily as needed.  . [  DISCONTINUED] metoprolol succinate (TOPROL-XL) 50 MG 24 hr tablet Take 1 tablet (50 mg total) by mouth daily. Take with or immediately following a meal.     Allergies:   Actos [pioglitazone]   Social History   Socioeconomic History  . Marital status: Married    Spouse name: Not on file  . Number of children: 2  . Years of education: 96  . Highest education level: Not on file  Occupational History  . Occupation: Government social research officer, Psychologist, occupational  Tobacco Use  . Smoking status: Former Smoker    Packs/day: 1.00    Years: 6.00    Pack years: 6.00    Types: Cigarettes    Quit date: 10/31/1969    Years since quitting: 51.1  . Smokeless tobacco: Never Used  Vaping Use  . Vaping Use: Never used  Substance and Sexual Activity  . Alcohol use: Yes    Alcohol/week: 0.0 standard drinks    Comment: OCCASIONAL  . Drug use: No  . Sexual activity: Never  Other Topics Concern  . Not on file  Social History Narrative   Regular exercise-no   Social Determinants of Health   Financial Resource Strain: Not on file  Food Insecurity: Not on file  Transportation Needs: Not on file  Physical Activity: Not on file  Stress: Not on file  Social Connections: Not on file     Family History: The patient's family history includes Arthritis in his father and mother; Cancer in his maternal uncle and paternal uncle; Diabetes in his brother and father; Heart attack (age of onset: 31) in his brother; Hypertension in his father and mother; Lymphoma in his paternal grandfather. ROS:   Please see the history of present illness.    All other systems reviewed and are  negative.  EKGs/Labs/Other Studies Reviewed:    The following studies were reviewed today:  EKG:  EKG ordered today and personally reviewed.  The ekg ordered today demonstrates bifascicular heart block right bundle branch block left anterior hemiblock and what appears to be 2-1 atypical flutter.  Recent Labs: 01/01/2020: ALT 40 05/25/2020: BUN 20; Creatinine, Ser 1.10; Potassium 3.4; Sodium 139 07/01/2020: Hemoglobin 15.4; Platelets 178  Recent Lipid Panel    Component Value Date/Time   CHOL 102 01/01/2020 0948   TRIG 144 01/01/2020 0948   HDL 30 (L) 01/01/2020 0948   CHOLHDL 3.4 01/01/2020 0948   CHOLHDL 4.2 07/20/2014 0822   VLDL 26 07/20/2014 0822   LDLCALC 47 01/01/2020 0948    Physical Exam:    VS:  BP 132/88   Pulse 90   Ht 5\' 11"  (1.803 m)   Wt 235 lb (106.6 kg)   SpO2 99%   BMI 32.78 kg/m     Wt Readings from Last 3 Encounters:  12/19/20 235 lb (106.6 kg)  08/19/20 233 lb (105.7 kg)  08/12/20 235 lb (106.6 kg)     GEN:  Well nourished, well developed in no acute distress HEENT: Normal NECK: No JVD; No carotid bruits LYMPHATICS: No lymphadenopathy CARDIAC: Heart is very rapid RRR, no murmurs, rubs, gallops RESPIRATORY:  Clear to auscultation without rales, wheezing or rhonchi  ABDOMEN: Soft, non-tender, non-distended MUSCULOSKELETAL:  No edema; No deformity  SKIN: Warm and dry NEUROLOGIC:  Alert and oriented x 3 PSYCHIATRIC:  Normal affect    Signed, Shirlee More, MD  12/19/2020 5:05 PM    [facility]

## 2020-12-19 ENCOUNTER — Ambulatory Visit: Payer: BC Managed Care – PPO | Admitting: Cardiology

## 2020-12-19 ENCOUNTER — Encounter: Payer: Self-pay | Admitting: Cardiology

## 2020-12-19 ENCOUNTER — Other Ambulatory Visit: Payer: Self-pay

## 2020-12-19 VITALS — BP 132/88 | HR 90 | Ht 71.0 in | Wt 235.0 lb

## 2020-12-19 DIAGNOSIS — I452 Bifascicular block: Secondary | ICD-10-CM | POA: Diagnosis not present

## 2020-12-19 DIAGNOSIS — E782 Mixed hyperlipidemia: Secondary | ICD-10-CM

## 2020-12-19 DIAGNOSIS — R0602 Shortness of breath: Secondary | ICD-10-CM | POA: Diagnosis not present

## 2020-12-19 DIAGNOSIS — I119 Hypertensive heart disease without heart failure: Secondary | ICD-10-CM | POA: Diagnosis not present

## 2020-12-19 DIAGNOSIS — I484 Atypical atrial flutter: Secondary | ICD-10-CM | POA: Diagnosis not present

## 2020-12-19 DIAGNOSIS — I25118 Atherosclerotic heart disease of native coronary artery with other forms of angina pectoris: Secondary | ICD-10-CM | POA: Diagnosis not present

## 2020-12-19 DIAGNOSIS — I480 Paroxysmal atrial fibrillation: Secondary | ICD-10-CM | POA: Diagnosis not present

## 2020-12-19 MED ORDER — METOPROLOL TARTRATE 50 MG PO TABS
50.0000 mg | ORAL_TABLET | Freq: Two times a day (BID) | ORAL | 3 refills | Status: DC
Start: 2020-12-19 — End: 2020-12-29

## 2020-12-19 MED ORDER — METOPROLOL SUCCINATE ER 50 MG PO TB24
50.0000 mg | ORAL_TABLET | Freq: Every day | ORAL | 3 refills | Status: DC
Start: 2020-12-19 — End: 2020-12-19

## 2020-12-19 MED ORDER — AMLODIPINE BESYLATE 10 MG PO TABS: 10.0000 mg | ORAL_TABLET | Freq: Every day | ORAL | 3 refills | Status: DC

## 2020-12-19 MED ORDER — NITROGLYCERIN 0.4 MG SL SUBL
0.4000 mg | SUBLINGUAL_TABLET | SUBLINGUAL | 3 refills | Status: DC | PRN
Start: 2020-12-19 — End: 2021-10-20

## 2020-12-19 MED ORDER — ROSUVASTATIN CALCIUM 10 MG PO TABS: 10.0000 mg | ORAL_TABLET | Freq: Every day | ORAL | 3 refills | Status: DC

## 2020-12-19 MED ORDER — VALSARTAN-HYDROCHLOROTHIAZIDE 320-25 MG PO TABS: 1.0000 | ORAL_TABLET | Freq: Every day | ORAL | 3 refills | Status: DC

## 2020-12-19 NOTE — Patient Instructions (Addendum)
Medication Instructions:  Your physician has recommended you make the following change in your medication:  START: Nitroglycerin 0.4 mg take one tablet by mouth every 5 minutes as needed for chest pain up to three times.  START: Lopressor 50 mg take one tablet by mouth twice daily.  STOP: Coreg *If you need a refill on your cardiac medications before your next appointment, please call your pharmacy*   Lab Work: Your physician recommends that you return for lab work in: TODAY CMP, Lipids, ProBNP, CBC If you have labs (blood work) drawn today and your tests are completely normal, you will receive your results only by: Marland Kitchen MyChart Message (if you have MyChart) OR . A paper copy in the mail If you have any lab test that is abnormal or we need to change your treatment, we will call you to review the results.   Testing/Procedures: None   Follow-Up: At [facility], you and your health needs are our priority.  As part of our continuing mission to provide you with exceptional heart care, we have created designated Provider Care Teams.  These Care Teams include your primary Cardiologist (physician) and Advanced Practice Providers (APPs -  Physician Assistants and Nurse Practitioners) who all work together to provide you with the care you need, when you need it.  We recommend signing up for the patient portal called "MyChart".  Sign up information is provided on this After Visit Summary.  MyChart is used to connect with patients for Virtual Visits (Telemedicine).  Patients are able to view lab/test results, encounter notes, upcoming appointments, etc.  Non-urgent messages can be sent to your provider as well.   To learn more about what you can do with MyChart, go to [email or web address].    Your next appointment:   2-3 weeks   The format for your next appointment:   In Person  Provider:   Shirlee More, MD or Dr. Harriet Masson   Other Instructions

## 2020-12-20 ENCOUNTER — Telehealth: Payer: Self-pay | Admitting: Cardiology

## 2020-12-20 ENCOUNTER — Ambulatory Visit: Payer: BC Managed Care – PPO | Admitting: Endocrinology

## 2020-12-20 VITALS — BP 140/90 | HR 160 | Ht 71.0 in | Wt 234.8 lb

## 2020-12-20 DIAGNOSIS — Z794 Long term (current) use of insulin: Secondary | ICD-10-CM | POA: Diagnosis not present

## 2020-12-20 DIAGNOSIS — E1159 Type 2 diabetes mellitus with other circulatory complications: Secondary | ICD-10-CM | POA: Diagnosis not present

## 2020-12-20 LAB — COMPREHENSIVE METABOLIC PANEL
ALT: 41 IU/L (ref 0–44)
AST: 26 IU/L (ref 0–40)
Albumin/Globulin Ratio: 2.2 (ref 1.2–2.2)
Albumin: 3.8 g/dL (ref 3.8–4.8)
Alkaline Phosphatase: 69 IU/L (ref 44–121)
BUN/Creatinine Ratio: 23 (ref 10–24)
BUN: 24 mg/dL (ref 8–27)
Bilirubin Total: 0.8 mg/dL (ref 0.0–1.2)
CO2: 23 mmol/L (ref 20–29)
Calcium: 9 mg/dL (ref 8.6–10.2)
Chloride: 106 mmol/L (ref 96–106)
Creatinine, Ser: 1.04 mg/dL (ref 0.76–1.27)
GFR calc Af Amer: 84 mL/min/{1.73_m2} (ref >59)
GFR calc non Af Amer: 72 mL/min/{1.73_m2} (ref >59)
Globulin, Total: 1.7 g/dL (ref 1.5–4.5)
Glucose: 223 mg/dL — ABNORMAL HIGH (ref 65–99)
Potassium: 4.3 mmol/L (ref 3.5–5.2)
Sodium: 141 mmol/L (ref 134–144)
Total Protein: 5.5 g/dL — ABNORMAL LOW (ref 6.0–8.5)

## 2020-12-20 LAB — LIPID PANEL
Chol/HDL Ratio: 3.5 ratio (ref 0.0–5.0)
Cholesterol, Total: 149 mg/dL (ref 100–199)
HDL: 42 mg/dL (ref >39)
LDL Chol Calc (NIH): 79 mg/dL (ref 0–99)
Triglycerides: 165 mg/dL — ABNORMAL HIGH (ref 0–149)
VLDL Cholesterol Cal: 28 mg/dL (ref 5–40)

## 2020-12-20 LAB — PRO B NATRIURETIC PEPTIDE: NT-Pro BNP: 3563 pg/mL — ABNORMAL HIGH (ref 0–376)

## 2020-12-20 LAB — CBC
Hematocrit: 51.3 % — ABNORMAL HIGH (ref 37.5–51.0)
Hemoglobin: 17.3 g/dL (ref 13.0–17.7)
MCH: 28.7 pg (ref 26.6–33.0)
MCHC: 33.7 g/dL (ref 31.5–35.7)
MCV: 85 fL (ref 79–97)
Platelets: 168 10*3/uL (ref 150–450)
RBC: 6.03 x10E6/uL — ABNORMAL HIGH (ref 4.14–5.80)
RDW: 13.2 % (ref 11.6–15.4)
WBC: 9.6 10*3/uL (ref 3.4–10.8)

## 2020-12-20 LAB — POCT GLYCOSYLATED HEMOGLOBIN (HGB A1C): Hemoglobin A1C: 8.3 % — AB (ref 4.0–5.6)

## 2020-12-20 MED ORDER — BASAGLAR KWIKPEN 100 UNIT/ML ~~LOC~~ SOPN: 70.0000 [IU] | PEN_INJECTOR | Freq: Every day | SUBCUTANEOUS | 3 refills | Status: DC

## 2020-12-20 MED ORDER — ONETOUCH ULTRA VI STRP: 1.0000 | ORAL_STRIP | Freq: Two times a day (BID) | 3 refills | Status: DC

## 2020-12-20 MED ORDER — TRULICITY 4.5 MG/0.5ML ~~LOC~~ SOAJ: 4.5000 mg | SUBCUTANEOUS | 3 refills | Status: DC

## 2020-12-20 NOTE — Progress Notes (Signed)
Subjective:    Patient ID: Matthew Schmitt, male    DOB: 1951/05/28, 70 y.o.   MRN: [id number]  HPI Pt returns for f/u of diabetes mellitus:  DM type: Insulin-requiring type 2.  Dx'ed: 2002.   Complications: renal insuff, leg ulcers, and CAD.   Therapy: insulin since 123456 and Trulicity.   DKA: never.   Severe hypoglycemia: once, in 2017.  Pancreatitis: never.   Other: he declines multiple daily injections;.  In early 2017, he requested to change levemir to lantus, due to high dosage requirement at the time; fructosamine has showed better glycemic control than A1c; He did not tolerate Farxiga (cramps).  Interval history: He takes 80 units qam, due to elev cbg's.  He increased due to prednisone rx'ed for right thigh pain, poss radicular in etiol.  He finished this rx yesterday.  Pt says cbg's have returned to the 100's.   Pt says he has not yet started taking the metoprolol Past Medical History:  Diagnosis Date  . Anxiety   . Arthritis    everywhere  . Arthrosis of right acromioclavicular joint 01/29/2020  . Atrial flutter [facility])    Onset May 2014 Cardioversion done   . BPH associated with nocturia   . BPH with obstruction/lower urinary tract symptoms 01/04/2017  . CAD (coronary artery disease) 04/02/2013   Cath 2012 moderate mid LAD lesion, otherwise not much disease   . Coronary artery disease    cardiologist-  dr Bettina Gavia (previouly dr Wynonia Lawman)  . Diabetes ([place]) 01/18/2018  . Encounter for long-term (current) use of other medications 07/27/2013  . Essential hypertension, benign   . First degree heart block   . Heart murmur   . History of bilateral knee replacement 02/07/2017  . History of bladder stone   . History of kidney stones   . History of melanoma excision    June 2016  --- s/p  excision right  leg (per pt localized and no recurrence)  . History of renal cell carcinoma 01/2017   s/p  left partial nephrecotmy  . Hypertensive heart disease 04/02/2013  . Impotence of organic  origin   . Left-sided low back pain with left-sided sciatica 01/23/2019  . Long term current use of anticoagulant therapy   . Mixed hyperlipidemia    slightly  . Multiple renal cysts    bilateral  . Nephrolithiasis 05/17/2014  . Nonischemic cardiomyopathy ([place])   . Osteoarthritis   . PAF (paroxysmal atrial fibrillation) ([place])    followed by cardiology  (takes ASA)  . RBBB   . 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 Right ureteral stone   . Rotator cuff syndrome of left shoulder 04/17/2016  . Status post total left knee replacement 01/01/2020  . Tendinopathy of right biceps tendon 01/29/2020  . Tendinopathy of right rotator cuff 01/29/2020  . Type 2 diabetes mellitus treated with insulin [facility])    endocrinologist-  dr Loanne Drilling  . Wears glasses     Past Surgical History:  Procedure Laterality Date  . CARDIAC CATHETERIZATION  08-17-2011  DR SPENCER TILLEY   POSSIBLE MODERATE LAD STENOSIS JUST AFTER THE BIFURCATION OF LARGE DIAGONAL BRANCH/ LVEF  40-45%/ MILD GLOBAL HYPODINESIS (CATH DONE FOR ABNORMAL STRESS TEST OF FIXED LATERAL WALL DEFECT & BIFASCICULAR BLAOCK)  . CARDIOVERSION N/A 04/02/2013   Procedure: CARDIOVERSION;  Surgeon: Jacolyn Reedy, MD;  Location: [place];  Service: Cardiovascular;  Laterality: N/A;  . CYSTOSCOPY WITH LITHOLAPAXY N/A 05/18/2015  Procedure: CYSTOSCOPY WITH LITHOLAPAXY;  Surgeon: Rana Snare, MD;  Location: [facility];  Service: Urology;  Laterality: N/A;  . CYSTOSCOPY WITH LITHOLAPAXY N/A 01/04/2017   Procedure: CYSTOSCOPY WITH LITHOLAPAXY, Holmium Laser;  Surgeon: Ardis Hughs, MD;  Location: [facility];  Service: Urology;  Laterality: N/A;  . CYSTOSCOPY WITH RETROGRADE PYELOGRAM, URETEROSCOPY AND STENT PLACEMENT  11/03/2012   Procedure: CYSTOSCOPY WITH RETROGRADE PYELOGRAM, URETEROSCOPY AND STENT PLACEMENT;  Surgeon: Bernestine Amass, MD;  Location[facility];  Service: Urology;   Laterality: Left;  Cystoscopy/Left Retrograde/Ureteroscopy/Holmium Laser Litho/Double J Stent  . CYSTOSCOPY WITH URETEROSCOPY, STONE BASKETRY AND STENT PLACEMENT Right 05/15/2019   Procedure: CYSTOSCOPY WITH URETEROSCOPY, LASER LITHOTRIPSY STONE BASKETRY AND STENT PLACEMENT, RIGHT RETROGRADE;  Surgeon: Ardis Hughs, MD;  Location: [facility];  Service: Urology;  Laterality: Right;  . HOLMIUM LASER APPLICATION  22/12/5425   Procedure: HOLMIUM LASER APPLICATION;  Surgeon: Bernestine Amass, MD;  Location: [facility];  Service: Urology;  Laterality: Left;  . HOLMIUM LASER APPLICATION N/A 0/62/3762   Procedure: HOLMIUM LASER APPLICATION;  Surgeon: Rana Snare, MD;  Location: [facility];  Service: Urology;  Laterality: N/A;  . KNEE SURGERY Bilateral [place]  . MELANOMA EXCISION Bilateral June 2016   20th-- left leg //  1st-- right leg w/ skin graft from right thigh (no lymph node bx)  . NEPHROLITHOTOMY Right 05/17/2014   Procedure: NEPHROLITHOTOMY PERCUTANEOUS;  Surgeon: Bernestine Amass, MD;  Location: [facility];  Service: Urology;  Laterality: Right;  . PILONIDAL CYST EXCISION    . ROBOTIC ASSITED PARTIAL NEPHRECTOMY Left 06/18/2013   Procedure: ROBOTIC ASSISTED PARTIAL NEPHRECTOMY;  Surgeon: Dutch Gray, MD;  Location: [facility];  Service: Urology;  Laterality: Left;  . ROTATOR CUFF REPAIR Left 07/2016  . SHOULDER ARTHROSCOPY WITH ROTATOR CUFF REPAIR AND SUBACROMIAL DECOMPRESSION Right 05/27/2020   Procedure: RIGHT SHOULDER ARTHROSCOPY WITH EXTENSIVE DEBRIDEMENT, DISTAL CLAVICLE EXCISION AND SUBACROMIAL DECOMPRESSION;  Surgeon: Leandrew Koyanagi, MD;  Location: [place];  Service: Orthopedics;  Laterality: Right;  . TONSILLECTOMY  32  (age 65)  . TOTAL KNEE ARTHROPLASTY Left 02/07/2017   Procedure: LEFT TOTAL KNEE ARTHROPLASTY;  Surgeon: Leandrew Koyanagi, MD;  Location: [place];  Service: Orthopedics;  Laterality: Left;  . TOTAL KNEE  ARTHROPLASTY Right 04/25/2017   Procedure: RIGHT TOTAL KNEE ARTHROPLASTY;  Surgeon: Leandrew Koyanagi, MD;  Location: [place];  Service: Orthopedics;  Laterality: Right;  . TRANSURETHRAL RESECTION OF PROSTATE N/A 01/04/2017   Procedure: TRANSURETHRAL RESECTION OF THE PROSTATE (TURP);  Surgeon: Ardis Hughs, MD;  Location[facility];  Service: Urology;  Laterality: N/A;    Social History   Socioeconomic History  . Marital status: Married    Spouse name: Not on file  . Number of children: 2  . Years of education: 30  . Highest education level: Not on file  Occupational History  . Occupation: Government social research officer, Psychologist, occupational  Tobacco Use  . Smoking status: Former Smoker    Packs/day: 1.00    Years: 6.00    Pack years: 6.00    Types: Cigarettes    Quit date: 10/31/1969    Years since quitting: 51.1  . Smokeless tobacco: Never Used  Vaping Use  . Vaping Use: Never used  Substance and Sexual Activity  . Alcohol use: Yes    Alcohol/week: 0.0 standard drinks    Comment: OCCASIONAL  . Drug use: No  .  Sexual activity: Never  Other Topics Concern  . Not on file  Social History Narrative   Regular exercise-no   Social Determinants of Health   Financial Resource Strain: Not on file  Food Insecurity: Not on file  Transportation Needs: Not on file  Physical Activity: Not on file  Stress: Not on file  Social Connections: Not on file  Intimate Partner Violence: Not on file    Current Outpatient Medications on File Prior to Visit  Medication Sig Dispense Refill  . amLODipine (NORVASC) 10 MG tablet Take 1 tablet (10 mg total) by mouth daily. 90 tablet 3  . aspirin EC 81 MG tablet Take 81 mg by mouth daily.    . Insulin Pen Needle (PEN NEEDLES) 30G X 8 MM MISC 1 each by Does not apply route daily. E11.9 90 each 0  . Lancets (ONETOUCH DELICA PLUS 123XX123) MISC 1 each by Other route 2 (two) times daily. E11.9 200 each 3  . metoprolol tartrate (LOPRESSOR) 50 MG tablet Take 1 tablet (50  mg total) by mouth 2 (two) times daily. 180 tablet 3  . nitroGLYCERIN (NITROSTAT) 0.4 MG SL tablet Place 1 tablet (0.4 mg total) under the tongue every 5 (five) minutes as needed for chest pain. 30 tablet 3  . rosuvastatin (CRESTOR) 10 MG tablet Take 1 tablet (10 mg total) by mouth daily. 90 tablet 3  . sildenafil (VIAGRA) 100 MG tablet Take 100 mg by mouth daily as needed.  2  . valsartan-hydrochlorothiazide (DIOVAN-HCT) 320-25 MG tablet Take 1 tablet by mouth daily. 90 tablet 3   No current facility-administered medications on file prior to visit.    Allergies  Allergen Reactions  . Actos [Pioglitazone] Swelling and Cough    SWELLING REACTION UNSPECIFIED  EDEMA    Family History  Problem Relation Age of Onset  . Arthritis Father   . Hypertension Father   . Diabetes Father   . Arthritis Mother   . Hypertension Mother   . Heart attack Brother 63  . Diabetes Brother   . Lymphoma Paternal Grandfather   . Cancer Maternal Uncle   . Cancer Paternal Uncle        type unknown    BP 140/90 (BP Location: Right Arm, Patient Position: Sitting, Cuff Size: Normal)   Pulse (!) 160   Ht 5\' 11"  (1.803 m)   Wt 234 lb 12.8 oz (106.5 kg)   SpO2 97%   BMI 32.75 kg/m   Review of Systems He denies hypoglycemia, CP, and SOB.      Objective:   Physical Exam VITAL SIGNS:  See vs page GENERAL: no distress CV: tachycardic reg rhythm.   Pulses: dorsalis pedis intact bilat.   MSK: no deformity of the feet.   CV: no leg edema.   Skin:  no ulcer on the feet.  normal color and temp on the feet, but there is severe hyperpigmentation of the legs.   Neuro: sensation is intact to touch on the feet.   Ext: there is bilateral onychomycosis of the toenails.  Both great toenails are absent.    ECG machine is not working  A1c=8.3%  I have reviewed outside records, and summarized: Pt was seen by cardiol, and as advised to start taking metoprolol  I called and discussed case with [place].  He  advises me to ask pt to start taking metoprolol, and f/u as sched.  We agree that ER availability is severely limited, and carries its own risks.  Assessment & Plan:  Tachycardia, severe exacerbation, prob due to a-flutter.  Insulin-requiring type 2 DM, uncontrolled.   Patient Instructions  Your blood pressure is high today.  Please see your primary care provider soon, to have it rechecked.   Please reduce the insulin to 70 units each morning, and:  check your blood sugar twice a day.  vary the time of day when you check, between before the 3 meals, and at bedtime.  also check if you have symptoms of your blood sugar being too high or too low.  please keep a record of the readings and bring it to your next appointment here (or you can bring the meter itself).  You can write it on any piece of paper.  please call us sooner if your blood sugar goes below 70, or if you have a lot of readings over 200. Please resume taking the metoprolol, and see Dr Bettina Gavia as scheduled. Please come back for a follow-up appointment in 3 months.

## 2020-12-20 NOTE — Telephone Encounter (Signed)
New message:     Dr. Lissa Merlin would like for Dr. Bettina Gavia to call him concering this patient. [phone]. I tried to call the DOD number and no one answered.

## 2020-12-20 NOTE — Patient Instructions (Addendum)
Your blood pressure is high today.  Please see your primary care provider soon, to have it rechecked.   Please reduce the insulin to 70 units each morning, and:  check your blood sugar twice a day.  vary the time of day when you check, between before the 3 meals, and at bedtime.  also check if you have symptoms of your blood sugar being too high or too low.  please keep a record of the readings and bring it to your next appointment here (or you can bring the meter itself).  You can write it on any piece of paper.  please call us sooner if your blood sugar goes below 70, or if you have a lot of readings over 200. Please resume taking the metoprolol, and see Dr Bettina Gavia as scheduled. Please come back for a follow-up appointment in 3 months.

## 2020-12-26 ENCOUNTER — Ambulatory Visit (HOSPITAL_COMMUNITY)
Admission: EM | Admit: 2020-12-26 | Discharge: 2020-12-26 | Disposition: A | Discharge status: Nursing Home (Includes ICF) | Payer: BC Managed Care – PPO | Attending: Internal Medicine | Admitting: Internal Medicine

## 2020-12-26 ENCOUNTER — Inpatient Hospital Stay (HOSPITAL_COMMUNITY)
Admission: EM | Admit: 2020-12-26 | Discharge: 2020-12-29 | DRG: 308 | Disposition: A | Discharge status: Home / Self Care | Payer: BC Managed Care – PPO | Attending: Cardiology | Admitting: Cardiology

## 2020-12-26 ENCOUNTER — Encounter (HOSPITAL_COMMUNITY): Payer: Self-pay | Admitting: Emergency Medicine

## 2020-12-26 ENCOUNTER — Emergency Department (HOSPITAL_COMMUNITY): Payer: BC Managed Care – PPO

## 2020-12-26 DIAGNOSIS — I452 Bifascicular block: Secondary | ICD-10-CM | POA: Diagnosis present

## 2020-12-26 DIAGNOSIS — E785 Hyperlipidemia, unspecified: Secondary | ICD-10-CM | POA: Diagnosis present

## 2020-12-26 DIAGNOSIS — Z8249 Family history of ischemic heart disease and other diseases of the circulatory system: Secondary | ICD-10-CM

## 2020-12-26 DIAGNOSIS — M5442 Lumbago with sciatica, left side: Secondary | ICD-10-CM | POA: Diagnosis present

## 2020-12-26 DIAGNOSIS — J9811 Atelectasis: Secondary | ICD-10-CM | POA: Diagnosis not present

## 2020-12-26 DIAGNOSIS — R609 Edema, unspecified: Secondary | ICD-10-CM | POA: Diagnosis not present

## 2020-12-26 DIAGNOSIS — I25118 Atherosclerotic heart disease of native coronary artery with other forms of angina pectoris: Secondary | ICD-10-CM | POA: Diagnosis not present

## 2020-12-26 DIAGNOSIS — M1990 Unspecified osteoarthritis, unspecified site: Secondary | ICD-10-CM | POA: Diagnosis present

## 2020-12-26 DIAGNOSIS — I428 Other cardiomyopathies: Secondary | ICD-10-CM | POA: Diagnosis present

## 2020-12-26 DIAGNOSIS — I4892 Unspecified atrial flutter: Principal | ICD-10-CM | POA: Diagnosis present

## 2020-12-26 DIAGNOSIS — Z833 Family history of diabetes mellitus: Secondary | ICD-10-CM

## 2020-12-26 DIAGNOSIS — R9431 Abnormal electrocardiogram [ECG] [EKG]: Secondary | ICD-10-CM | POA: Diagnosis not present

## 2020-12-26 DIAGNOSIS — N401 Enlarged prostate with lower urinary tract symptoms: Secondary | ICD-10-CM | POA: Diagnosis present

## 2020-12-26 DIAGNOSIS — I2510 Atherosclerotic heart disease of native coronary artery without angina pectoris: Secondary | ICD-10-CM | POA: Diagnosis present

## 2020-12-26 DIAGNOSIS — R000 Tachycardia, unspecified: Secondary | ICD-10-CM | POA: Diagnosis not present

## 2020-12-26 DIAGNOSIS — D696 Thrombocytopenia, unspecified: Secondary | ICD-10-CM | POA: Diagnosis not present

## 2020-12-26 DIAGNOSIS — Z20822 Contact with and (suspected) exposure to covid-19: Secondary | ICD-10-CM | POA: Diagnosis not present

## 2020-12-26 DIAGNOSIS — E876 Hypokalemia: Secondary | ICD-10-CM

## 2020-12-26 DIAGNOSIS — I10 Essential (primary) hypertension: Secondary | ICD-10-CM

## 2020-12-26 DIAGNOSIS — Z85528 Personal history of other malignant neoplasm of kidney: Secondary | ICD-10-CM | POA: Diagnosis not present

## 2020-12-26 DIAGNOSIS — E1165 Type 2 diabetes mellitus with hyperglycemia: Secondary | ICD-10-CM | POA: Diagnosis not present

## 2020-12-26 DIAGNOSIS — E119 Type 2 diabetes mellitus without complications: Secondary | ICD-10-CM

## 2020-12-26 DIAGNOSIS — F419 Anxiety disorder, unspecified: Secondary | ICD-10-CM | POA: Diagnosis present

## 2020-12-26 DIAGNOSIS — N179 Acute kidney failure, unspecified: Secondary | ICD-10-CM | POA: Diagnosis not present

## 2020-12-26 DIAGNOSIS — Z905 Acquired absence of kidney: Secondary | ICD-10-CM

## 2020-12-26 DIAGNOSIS — I480 Paroxysmal atrial fibrillation: Secondary | ICD-10-CM | POA: Diagnosis not present

## 2020-12-26 DIAGNOSIS — I350 Nonrheumatic aortic (valve) stenosis: Secondary | ICD-10-CM | POA: Diagnosis not present

## 2020-12-26 DIAGNOSIS — Z974 Presence of external hearing-aid: Secondary | ICD-10-CM

## 2020-12-26 DIAGNOSIS — R52 Pain, unspecified: Secondary | ICD-10-CM | POA: Diagnosis not present

## 2020-12-26 DIAGNOSIS — R0789 Other chest pain: Secondary | ICD-10-CM | POA: Diagnosis not present

## 2020-12-26 DIAGNOSIS — Z7901 Long term (current) use of anticoagulants: Secondary | ICD-10-CM

## 2020-12-26 DIAGNOSIS — Z87442 Personal history of urinary calculi: Secondary | ICD-10-CM

## 2020-12-26 DIAGNOSIS — Z6831 Body mass index (BMI) 31.0-31.9, adult: Secondary | ICD-10-CM

## 2020-12-26 DIAGNOSIS — I110 Hypertensive heart disease with heart failure: Secondary | ICD-10-CM | POA: Diagnosis not present

## 2020-12-26 DIAGNOSIS — I5043 Acute on chronic combined systolic (congestive) and diastolic (congestive) heart failure: Secondary | ICD-10-CM | POA: Diagnosis present

## 2020-12-26 DIAGNOSIS — I4891 Unspecified atrial fibrillation: Secondary | ICD-10-CM | POA: Diagnosis not present

## 2020-12-26 DIAGNOSIS — N529 Male erectile dysfunction, unspecified: Secondary | ICD-10-CM | POA: Diagnosis present

## 2020-12-26 DIAGNOSIS — E782 Mixed hyperlipidemia: Secondary | ICD-10-CM | POA: Diagnosis present

## 2020-12-26 DIAGNOSIS — E669 Obesity, unspecified: Secondary | ICD-10-CM | POA: Diagnosis present

## 2020-12-26 DIAGNOSIS — I440 Atrioventricular block, first degree: Secondary | ICD-10-CM | POA: Diagnosis not present

## 2020-12-26 DIAGNOSIS — Z96653 Presence of artificial knee joint, bilateral: Secondary | ICD-10-CM | POA: Diagnosis present

## 2020-12-26 DIAGNOSIS — R0602 Shortness of breath: Secondary | ICD-10-CM | POA: Diagnosis not present

## 2020-12-26 DIAGNOSIS — R059 Cough, unspecified: Secondary | ICD-10-CM | POA: Diagnosis not present

## 2020-12-26 DIAGNOSIS — Z87891 Personal history of nicotine dependence: Secondary | ICD-10-CM

## 2020-12-26 DIAGNOSIS — I340 Nonrheumatic mitral (valve) insufficiency: Secondary | ICD-10-CM | POA: Diagnosis not present

## 2020-12-26 DIAGNOSIS — Z8261 Family history of arthritis: Secondary | ICD-10-CM

## 2020-12-26 DIAGNOSIS — I351 Nonrheumatic aortic (valve) insufficiency: Secondary | ICD-10-CM | POA: Diagnosis not present

## 2020-12-26 DIAGNOSIS — Z85820 Personal history of malignant melanoma of skin: Secondary | ICD-10-CM

## 2020-12-26 DIAGNOSIS — Z7982 Long term (current) use of aspirin: Secondary | ICD-10-CM

## 2020-12-26 DIAGNOSIS — G4733 Obstructive sleep apnea (adult) (pediatric): Secondary | ICD-10-CM | POA: Diagnosis not present

## 2020-12-26 DIAGNOSIS — Z79899 Other long term (current) drug therapy: Secondary | ICD-10-CM

## 2020-12-26 DIAGNOSIS — Z807 Family history of other malignant neoplasms of lymphoid, hematopoietic and related tissues: Secondary | ICD-10-CM

## 2020-12-26 DIAGNOSIS — R319 Hematuria, unspecified: Secondary | ICD-10-CM | POA: Diagnosis present

## 2020-12-26 LAB — CBC
HCT: 45.6 % (ref 39.0–52.0)
Hemoglobin: 15.2 g/dL (ref 13.0–17.0)
MCH: 29.1 pg (ref 26.0–34.0)
MCHC: 33.3 g/dL (ref 30.0–36.0)
MCV: 87.2 fL (ref 80.0–100.0)
Platelets: 103 10*3/uL — ABNORMAL LOW (ref 150–400)
RBC: 5.23 MIL/uL (ref 4.22–5.81)
RDW: 14 % (ref 11.5–15.5)
WBC: 6.8 10*3/uL (ref 4.0–10.5)
nRBC: 0 % (ref 0.0–0.2)

## 2020-12-26 LAB — CBG MONITORING, ED: Glucose-Capillary: 230 mg/dL — ABNORMAL HIGH (ref 70–99)

## 2020-12-26 LAB — TSH: TSH: 2.112 u[IU]/mL (ref 0.350–4.500)

## 2020-12-26 LAB — PROTIME-INR
INR: 1.3 — ABNORMAL HIGH (ref 0.8–1.2)
Prothrombin Time: 15.4 seconds — ABNORMAL HIGH (ref 11.4–15.2)

## 2020-12-26 LAB — BASIC METABOLIC PANEL
Anion gap: 8 (ref 5–15)
BUN: 26 mg/dL — ABNORMAL HIGH (ref 8–23)
CO2: 24 mmol/L (ref 22–32)
Calcium: 8.7 mg/dL — ABNORMAL LOW (ref 8.9–10.3)
Chloride: 109 mmol/L (ref 98–111)
Creatinine, Ser: 1.51 mg/dL — ABNORMAL HIGH (ref 0.61–1.24)
GFR, Estimated: 49 mL/min — ABNORMAL LOW (ref >60)
Glucose, Bld: 250 mg/dL — ABNORMAL HIGH (ref 70–99)
Potassium: 4.3 mmol/L (ref 3.5–5.1)
Sodium: 141 mmol/L (ref 135–145)

## 2020-12-26 LAB — SARS CORONAVIRUS 2 (TAT 6-24 HRS): SARS Coronavirus 2: NEGATIVE

## 2020-12-26 LAB — HIV ANTIBODY (ROUTINE TESTING W REFLEX): HIV Screen 4th Generation wRfx: NONREACTIVE

## 2020-12-26 LAB — BRAIN NATRIURETIC PEPTIDE: B Natriuretic Peptide: 412 pg/mL — ABNORMAL HIGH (ref 0.0–100.0)

## 2020-12-26 MED ORDER — APIXABAN 5 MG PO TABS
5.0000 mg | ORAL_TABLET | Freq: Two times a day (BID) | ORAL | Status: DC
  Administered 2020-12-26 – 2020-12-29 (×7): 5 mg via ORAL
  Filled 2020-12-26 (×7): qty 1

## 2020-12-26 MED ORDER — ONDANSETRON HCL 4 MG/2ML IJ SOLN: 4.0000 mg | Freq: Four times a day (QID) | INTRAMUSCULAR | Status: DC | PRN

## 2020-12-26 MED ORDER — INSULIN ASPART 100 UNIT/ML ~~LOC~~ SOLN
0.0000 [IU] | Freq: Three times a day (TID) | SUBCUTANEOUS | Status: DC
  Administered 2020-12-27 – 2020-12-28 (×3): 3 [IU] via SUBCUTANEOUS
  Administered 2020-12-28: 8 [IU] via SUBCUTANEOUS
  Administered 2020-12-29: 3 [IU] via SUBCUTANEOUS

## 2020-12-26 MED ORDER — FUROSEMIDE 10 MG/ML IJ SOLN
40.0000 mg | Freq: Two times a day (BID) | INTRAMUSCULAR | Status: DC
  Administered 2020-12-26 – 2020-12-29 (×5): 40 mg via INTRAVENOUS
  Filled 2020-12-26 (×6): qty 4

## 2020-12-26 MED ORDER — SODIUM CHLORIDE 0.9 % IV SOLN: INTRAVENOUS | Status: DC

## 2020-12-26 MED ORDER — ROSUVASTATIN CALCIUM 20 MG PO TABS
20.0000 mg | ORAL_TABLET | Freq: Every day | ORAL | Status: DC
  Administered 2020-12-26 – 2020-12-29 (×4): 20 mg via ORAL
  Filled 2020-12-26 (×4): qty 1

## 2020-12-26 MED ORDER — AMIODARONE HCL IN DEXTROSE 360-4.14 MG/200ML-% IV SOLN
30.0000 mg/h | INTRAVENOUS | Status: DC
  Administered 2020-12-27 – 2020-12-28 (×2): 30 mg/h via INTRAVENOUS
  Filled 2020-12-26 (×2): qty 200

## 2020-12-26 MED ORDER — ACETAMINOPHEN 325 MG PO TABS: 650.0000 mg | ORAL_TABLET | ORAL | Status: DC | PRN

## 2020-12-26 MED ORDER — METOPROLOL TARTRATE 50 MG PO TABS
50.0000 mg | ORAL_TABLET | Freq: Two times a day (BID) | ORAL | Status: DC
  Administered 2020-12-26 – 2020-12-28 (×5): 50 mg via ORAL
  Filled 2020-12-26: qty 2
  Filled 2020-12-26 (×2): qty 1
  Filled 2020-12-26 (×2): qty 2

## 2020-12-26 MED ORDER — AMIODARONE LOAD VIA INFUSION
150.0000 mg | Freq: Once | INTRAVENOUS | Status: AC
  Administered 2020-12-26: 150 mg via INTRAVENOUS
  Filled 2020-12-26: qty 83.34

## 2020-12-26 MED ORDER — AMIODARONE HCL IN DEXTROSE 360-4.14 MG/200ML-% IV SOLN
60.0000 mg/h | INTRAVENOUS | Status: AC
  Administered 2020-12-26 (×2): 60 mg/h via INTRAVENOUS
  Filled 2020-12-26 (×2): qty 200

## 2020-12-26 MED ORDER — NITROGLYCERIN 0.4 MG SL SUBL: 0.4000 mg | SUBLINGUAL_TABLET | SUBLINGUAL | Status: DC | PRN

## 2020-12-26 NOTE — ED Notes (Signed)
Called EMS for emergent transport.

## 2020-12-26 NOTE — ED Triage Notes (Addendum)
Pt c/o CP, rapid heart rate for approx 1 week, SOB, cough that worsens at night and increasing edema to legs, dizziness for approx 1 week.  Denies pain to back/jaw, arms, n/v.  Reports has had to sleep in recliner 2/2 SOB, and "cold feet" with swelling.    SOB noted at rest with speaking, +3 edema to BLE, and  Pt rates mid-sternal CP as pressure/discomfort 3/10. EKG performed and given to L. Phillip Heal, [place] who came in for immediate BS eval. Recommended transfer to ED via EMS Pt reports he had eval by cardiologist last Monday and developed tachy rhythm while in office and was given Rx for 50mg  Metoprolol which pt began taking approx 3 days ago. Pt was given Rx for NTG 0.4mg  SL, but has not taken it for CP.  Pt reports he did not take amlodipine this morning or other Rx other than metoprolol .

## 2020-12-26 NOTE — ED Triage Notes (Signed)
Pt arrives via [facility] from [facility] with c/o rapid heart rate, SOB and edema to bilateral legs x1 week. Reports doctor recently placed him on metoprolol for new onset AFIb. Found to be in aflutter w R[facility] at [facility]. Endorses some chest pressure and exertional sob. Sent to ED for further eval.

## 2020-12-26 NOTE — Discharge Instructions (Signed)
Sent to [facility] via EMS.

## 2020-12-26 NOTE — ED Provider Notes (Signed)
[facility] EMERGENCY DEPARTMENT Provider Note   CSN: [id number] Arrival date & time: 12/26/20  0160     History Chief Complaint  Patient presents with  . Atrial Fibrillation    Matthew Schmitt is a 70 y.o. male.  Past medical history atrial flutter, coronary artery disease, diabetes, hypertension presents to ER with concern for elevated heart rate.  Went to cardiology office 1 week ago and his heart rate was around 160.  He was instructed to take metoprolol.  Patient states that he also had some associated generalized weakness, fatigue and shortness of breath.  These symptoms have continued throughout the week and his heart rate has not improved.  Heart rate today still in the 140s.  No chest pain at present but states he does have some chest pressure associated with his symptoms.  Breathing worse with exertion and worse with lying flat.   Per review of chart - aflutter, rate 160 on 1/31. Plan to start [facility] and beta blocker. Patient has been taking metoprolol but I don't see any anticoagulation prescribed and patient unsure.   HPI     Past Medical History:  Diagnosis Date  . Anxiety   . Arthritis    everywhere  . Arthrosis of right acromioclavicular joint 01/29/2020  . Atrial flutter [facility])    Onset May 2014 Cardioversion done   . BPH associated with nocturia   . BPH with obstruction/lower urinary tract symptoms 01/04/2017  . CAD (coronary artery disease) 04/02/2013   Cath 2012 moderate mid LAD lesion, otherwise not much disease   . Coronary artery disease    cardiologist-  dr Bettina Gavia (previouly dr Wynonia Lawman)  . Diabetes ([place]) 01/18/2018  . Encounter for long-term (current) use of other medications 07/27/2013  . Essential hypertension, benign   . First degree heart block   . Heart murmur   . History of bilateral knee replacement 02/07/2017  . History of bladder stone   . History of kidney stones   . History of melanoma excision    June 2016  --- s/p  excision right  leg  (per pt localized and no recurrence)  . History of renal cell carcinoma 01/2017   s/p  left partial nephrecotmy  . Hypertensive heart disease 04/02/2013  . Impotence of organic origin   . Left-sided low back pain with left-sided sciatica 01/23/2019  . Long term current use of anticoagulant therapy   . Mixed hyperlipidemia    slightly  . Multiple renal cysts    bilateral  . Nephrolithiasis 05/17/2014  . Nonischemic cardiomyopathy ([place])   . Osteoarthritis   . PAF (paroxysmal atrial fibrillation) ([place])    followed by cardiology  (takes ASA)  . RBBB   . 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 Right ureteral stone   . Rotator cuff syndrome of left shoulder 04/17/2016  . Status post total left knee replacement 01/01/2020  . Tendinopathy of right biceps tendon 01/29/2020  . Tendinopathy of right rotator cuff 01/29/2020  . Type 2 diabetes mellitus treated with insulin [facility])    endocrinologist-  dr Loanne Drilling  . Wears glasses     Patient Active Problem List   Diagnosis Date Noted  . Wears glasses   . Type 2 diabetes mellitus treated with insulin ([place])   . Right ureteral stone   . RBBB   . PAF (paroxysmal atrial fibrillation) ([place])   . Multiple renal cysts   . Impotence of organic origin   .  History of melanoma excision   . History of kidney stones   . History of bladder stone   . Heart murmur   . First degree heart block   . Essential hypertension, benign   . Coronary artery disease   . BPH associated with nocturia   . Arthritis   . Anxiety   . Tendinopathy of right rotator cuff 01/29/2020  . Tendinopathy of right biceps tendon 01/29/2020  . Impingement syndrome of right shoulder 01/29/2020  . Arthrosis of right acromioclavicular joint 01/29/2020  . Chronic right shoulder pain 01/01/2020  . Status post total left knee replacement 01/01/2020  . Left-sided low back pain with left-sided sciatica 01/23/2019  . Diabetes ([place]) 01/18/2018  .  History of bilateral knee replacement 02/07/2017  . Primary osteoarthritis of left knee   . History of renal cell carcinoma 01/2017  . BPH with obstruction/lower urinary tract symptoms 01/04/2017  . Rotator cuff syndrome of left shoulder 04/17/2016  . Nephrolithiasis 05/17/2014  . Encounter for long-term (current) use of other medications 07/27/2013  . Long term current use of anticoagulant therapy   . CAD (coronary artery disease) 04/02/2013  . Hypertensive heart disease 04/02/2013  . 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 Atrial flutter ([place])   . Nonischemic cardiomyopathy ([place])   . Obesity (BMI 30-39.9)   . History of kidney stones   . Osteoarthritis   . Hyperlipidemia     Past Surgical History:  Procedure Laterality Date  . CARDIAC CATHETERIZATION  08-17-2011  DR SPENCER TILLEY   POSSIBLE MODERATE LAD STENOSIS JUST AFTER THE BIFURCATION OF LARGE DIAGONAL BRANCH/ LVEF  40-45%/ MILD GLOBAL HYPODINESIS (CATH DONE FOR ABNORMAL STRESS TEST OF FIXED LATERAL WALL DEFECT & BIFASCICULAR BLAOCK)  . CARDIOVERSION N/A 04/02/2013   Procedure: CARDIOVERSION;  Surgeon: Jacolyn Reedy, MD;  Location: [place];  Service: Cardiovascular;  Laterality: N/A;  . CYSTOSCOPY WITH LITHOLAPAXY N/A 05/18/2015   Procedure: CYSTOSCOPY WITH LITHOLAPAXY;  Surgeon: Rana Snare, MD;  [facility];  Service: Urology;  Laterality: N/A;  . CYSTOSCOPY WITH LITHOLAPAXY N/A 01/04/2017   Procedure: CYSTOSCOPY WITH LITHOLAPAXY, Holmium Laser;  Surgeon: Ardis Hughs, MD;  Location: [facility];  Service: Urology;  Laterality: N/A;  . CYSTOSCOPY WITH RETROGRADE PYELOGRAM, URETEROSCOPY AND STENT PLACEMENT  11/03/2012   Procedure: CYSTOSCOPY WITH RETROGRADE PYELOGRAM, URETEROSCOPY AND STENT PLACEMENT;  Surgeon: Bernestine Amass, MD;  Location: [facility];  Service: Urology;  Laterality: Left;  Cystoscopy/Left Retrograde/Ureteroscopy/Holmium Laser  Litho/Double J Stent  . CYSTOSCOPY WITH URETEROSCOPY, STONE BASKETRY AND STENT PLACEMENT Right 05/15/2019   Procedure: CYSTOSCOPY WITH URETEROSCOPY, LASER LITHOTRIPSY STONE BASKETRY AND STENT PLACEMENT, RIGHT RETROGRADE;  Surgeon: Ardis Hughs, MD;  Location: [facility];  Service: Urology;  Laterality: Right;  . HOLMIUM LASER APPLICATION  [id number]   Procedure: HOLMIUM LASER APPLICATION;  Surgeon: Bernestine Amass, MD;  Location: [facility];  Service: Urology;  Laterality: Left;  . HOLMIUM LASER APPLICATION N/A 0000000   Procedure: HOLMIUM LASER APPLICATION;  Surgeon: Rana Snare, MD;  Location: [facility];  Service: Urology;  Laterality: N/A;  . KNEE SURGERY Bilateral [place]  . MELANOMA EXCISION Bilateral June 2016   20th-- left leg //  1st-- right leg w/ skin graft from right thigh (no lymph node bx)  . NEPHROLITHOTOMY Right 05/17/2014   Procedure: NEPHROLITHOTOMY PERCUTANEOUS;  Surgeon: Bernestine Amass, MD;  Location[facility];  Service: Urology;  Laterality: Right;  . PILONIDAL CYST EXCISION    . ROBOTIC ASSITED PARTIAL NEPHRECTOMY Left 06/18/2013   Procedure: ROBOTIC ASSISTED PARTIAL NEPHRECTOMY;  Surgeon: Dutch Gray, MD;  Location: [facility];  Service: Urology;  Laterality: Left;  . ROTATOR CUFF REPAIR Left 07/2016  . SHOULDER ARTHROSCOPY WITH ROTATOR CUFF REPAIR AND SUBACROMIAL DECOMPRESSION Right 05/27/2020   Procedure: RIGHT SHOULDER ARTHROSCOPY WITH EXTENSIVE DEBRIDEMENT, DISTAL CLAVICLE EXCISION AND SUBACROMIAL DECOMPRESSION;  Surgeon: Leandrew Koyanagi, MD;  Location: [place];  Service: Orthopedics;  Laterality: Right;  . TONSILLECTOMY  31  (age 37)  . TOTAL KNEE ARTHROPLASTY Left 02/07/2017   Procedure: LEFT TOTAL KNEE ARTHROPLASTY;  Surgeon: Leandrew Koyanagi, MD;  Location: [place];  Service: Orthopedics;  Laterality: Left;  . TOTAL KNEE ARTHROPLASTY Right 04/25/2017   Procedure: RIGHT TOTAL KNEE ARTHROPLASTY;  Surgeon:  Leandrew Koyanagi, MD;  Location: [place];  Service: Orthopedics;  Laterality: Right;  . TRANSURETHRAL RESECTION OF PROSTATE N/A 01/04/2017   Procedure: TRANSURETHRAL RESECTION OF THE PROSTATE (TURP);  Surgeon: Ardis Hughs, MD;  [facility];  Service: Urology;  Laterality: N/A;       Family History  Problem Relation Age of Onset  . Arthritis Father   . Hypertension Father   . Diabetes Father   . Arthritis Mother   . Hypertension Mother   . Heart attack Brother 51  . Diabetes Brother   . Lymphoma Paternal Grandfather   . Cancer Maternal Uncle   . Cancer Paternal Uncle        type unknown    Social History   Tobacco Use  . Smoking status: Former Smoker    Packs/day: 1.00    Years: 6.00    Pack years: 6.00    Types: Cigarettes    Quit date: 10/31/1969    Years since quitting: 51.1  . Smokeless tobacco: Never Used  Vaping Use  . Vaping Use: Never used  Substance Use Topics  . Alcohol use: Yes    Alcohol/week: 0.0 standard drinks    Comment: OCCASIONAL  . Drug use: No    Home Medications Prior to Admission medications   Medication Sig Start Date End Date Taking? Authorizing Provider  amLODipine (NORVASC) 10 MG tablet Take 1 tablet (10 mg total) by mouth daily. 12/19/20  Yes Richardo Priest, MD  aspirin EC 81 MG tablet Take 81 mg by mouth daily.   Yes [provider]  Dulaglutide (TRULICITY) 4.5 NK/5.3ZJ SOPN Inject 4.5 mg into the skin once a week. Patient taking differently: Inject 4.5 mg into the skin once a week. Friday 12/20/20  Yes Renato Shin, MD  Insulin Glargine [facility]) 100 UNIT/ML Inject 70 Units into the skin daily with breakfast. 12/20/20  Yes Renato Shin, MD  metoprolol tartrate (LOPRESSOR) 50 MG tablet Take 1 tablet (50 mg total) by mouth 2 (two) times daily. 12/19/20 03/19/21 Yes Richardo Priest, MD  naproxen sodium (ALEVE) 220 MG tablet Take 440 mg by mouth daily as needed (pain).   Yes [provider]  nitroGLYCERIN  (NITROSTAT) 0.4 MG SL tablet Place 1 tablet (0.4 mg total) under the tongue every 5 (five) minutes as needed for chest pain. 12/19/20 03/19/21 Yes Richardo Priest, MD  rosuvastatin (CRESTOR) 10 MG tablet Take 1 tablet (10 mg total) by mouth daily. 12/19/20 03/19/21 Yes Richardo Priest, MD  sildenafil (VIAGRA) 100 MG tablet Take 100 mg by mouth daily as needed for erectile dysfunction. 05/30/18  Yes [provider]  trolamine salicylate (ASPERCREME) 10 % cream Apply 1 application topically as needed for muscle pain.   Yes [provider]  valsartan-hydrochlorothiazide (DIOVAN-HCT) 320-25 MG tablet Take 1 tablet by mouth daily. 12/19/20  Yes Richardo Priest, MD  glucose blood (ONETOUCH ULTRA) test strip 1 each by Other route 2 (two) times daily. E11.9 12/20/20   Renato Shin, MD  Insulin Pen Needle (PEN NEEDLES) 30G X 8 MM MISC 1 each by Does not apply route daily. E11.9 07/07/20   Renato Shin, MD  Lancets [facility] DELICA PLUS [id number]) [place] 1 each by Other route 2 (two) times daily. E11.9 07/07/20   Renato Shin, MD    Allergies    Actos [pioglitazone]  Review of Systems   Review of Systems  Constitutional: Positive for fatigue. Negative for chills and fever.  HENT: Negative for ear pain and sore throat.   Eyes: Negative for pain and visual disturbance.  Respiratory: Positive for chest tightness and shortness of breath. Negative for cough.   Cardiovascular: Positive for chest pain. Negative for palpitations.  Gastrointestinal: Negative for abdominal pain and vomiting.  Genitourinary: Negative for dysuria and hematuria.  Musculoskeletal: Negative for arthralgias and back pain.  Skin: Negative for color change and rash.  Neurological: Negative for seizures and syncope.  All other systems reviewed and are negative.   Physical Exam Updated Vital Signs BP (!) 127/91   Pulse (!) 138   Temp 98.9 F (37.2 C) (Oral)   Resp (!) 21   SpO2 95%   Physical Exam Vitals and nursing  note reviewed.  Constitutional:      Appearance: He is well-developed and well-nourished.  HENT:     Head: Normocephalic and atraumatic.  Eyes:     Conjunctiva/sclera: Conjunctivae normal.  Cardiovascular:     Rate and Rhythm: Normal rate and regular rhythm.     Heart sounds: No murmur heard.   Pulmonary:     Effort: Pulmonary effort is normal. No respiratory distress.     Breath sounds: Normal breath sounds.  Abdominal:     Palpations: Abdomen is soft.     Tenderness: There is no abdominal tenderness.  Musculoskeletal:        General: No edema.     Cervical back: Neck supple.     Comments: Mild pitting edema to bilateral lower legs  Skin:    General: Skin is warm and dry.  Neurological:     Mental Status: He is alert.  Psychiatric:        Mood and Affect: Mood and affect normal.     ED Results / Procedures / Treatments   Labs (all labs ordered are listed, but only abnormal results are displayed) Labs Reviewed  BASIC METABOLIC PANEL - Abnormal; Notable for the following components:      Result Value   Glucose, Bld 250 (*)    BUN 26 (*)    Creatinine, Ser 1.51 (*)    Calcium 8.7 (*)    GFR, Estimated 49 (*)    All other components within normal limits  CBC - Abnormal; Notable for the following components:   Platelets 103 (*)    All other components within normal limits  SARS CORONAVIRUS 2 (TAT 6-24 HRS)  BRAIN NATRIURETIC PEPTIDE  TSH    EKG EKG Interpretation  Date/Time:  Monday December 26 2020 09:16:46 EST Ventricular Rate:  142 PR Interval:    QRS Duration: 154 QT Interval:  396 QTC Calculation: 609 R Axis:   -  81 Text Interpretation: Wide QRS tachycardia with occasional Premature ventricular complexes Right bundle branch block Left anterior fascicular block  Bifascicular block  Left ventricular hypertrophy with repolarization abnormality ( R in aVL , [place]hilt-Estes ) Abnormal ECG Confirmed by Madalyn Rob [id number]) on 12/26/2020 12:30:30  PM   Radiology DG Chest 2 View  Result Date: 12/26/2020 CLINICAL DATA:  Atrial fibrillation.  Cough and shortness of breath EXAM: CHEST - 2 VIEW COMPARISON:  Chest CT December 31, 2017 FINDINGS: There is mild atelectatic change in the lateral right base. The lungs elsewhere are clear. Heart is upper normal in size with pulmonary vascularity normal. No adenopathy. There are degenerative changes in the thoracic spine. IMPRESSION: Mild atelectasis lateral right base. Lungs elsewhere clear. Heart upper normal in size. Electronically Signed   By: Lowella Grip III M.D.   On: 12/26/2020 09:32    Procedures .Critical Care Performed by: Lucrezia Starch, MD Authorized by: Lucrezia Starch, MD   Critical care provider statement:    Critical care time (minutes):  45   Critical care was necessary to treat or prevent imminent or life-threatening deterioration of the following conditions:  Cardiac failure and circulatory failure   Critical care was time spent personally by me on the following activities:  Discussions with consultants, evaluation of patient's response to treatment, examination of patient, ordering and performing treatments and interventions, ordering and review of laboratory studies, ordering and review of radiographic studies, pulse oximetry, re-evaluation of patient's condition, obtaining history from patient or surrogate and review of old charts     Medications Ordered in ED Medications  apixaban (ELIQUIS) tablet 5 mg (has no administration in time range)  furosemide (LASIX) injection 40 mg (has no administration in time range)  amiodarone (NEXTERONE) 1.8 mg/mL load via infusion 150 mg (has no administration in time range)    Followed by  amiodarone (NEXTERONE PREMIX) 360-4.14 MG/200ML-% (1.8 mg/mL) IV infusion (has no administration in time range)    Followed by  amiodarone (NEXTERONE PREMIX) 360-4.14 MG/200ML-% (1.8 mg/mL) IV infusion (has no administration in time range)   metoprolol tartrate (LOPRESSOR) tablet 50 mg (has no administration in time range)    ED Course  I have reviewed the triage vital signs and the nursing notes.  Pertinent labs & imaging results that were available during my care of the patient were reviewed by me and considered in my medical decision making (see chart for details).    MDM Rules/Calculators/A&P                         70 year old male with history of atrial flutter/A. fib presents ER with concern for elevated heart rate.  Today, vitals with heart rate in 140s.  EKG showing atrial flutter with rapid ventricular rate.  Remainder of vital signs stable patient in no acute distress.  Consulted cardiology, discussed with Dr. Marlou Porch.  They will evaluate patient and admit.  They started amiodarone drip, will likely do TEE guided cardioversion later.  Final Clinical Impression(s) / ED Diagnoses Final diagnoses:  Atrial flutter, unspecified type [facility])    Rx / DC Orders ED Discharge Orders    None       Lucrezia Starch, MD 12/26/20 1437

## 2020-12-26 NOTE — ED Provider Notes (Signed)
[place]    CSN: [id number] Arrival date & time: 12/26/20  [id number]      History   Chief Complaint Chief Complaint  Patient presents with  . Shortness of Breath    HPI Matthew Schmitt is a 70 y.o. male presenting for chest pain and shortness of breath.  History of atrial flutter, CAD with coronary cath in 2012, diabetes, hypertension, first-degree heart block, heart murmur, right bundle branch block.  Patient states that he has had 3 out of 10 chest pressure for 1 week.  Also endorses shortness of breath that is getting worse. He has to sleep sitting up due to this.  Endorses intermittent dizziness.  Patient states that 1 week ago he went to appointment with his cardiologist and they changed several of his medications.  They started him on a beta-blocker (Lopressor), stopped Coreg started nitroglycerin.  Endorses new pedal edema that is worse with sitting and standing. Denies pain in left arm denies left jaw pain denies nausea vomiting diarrhea, indigestion.  HPI  Past Medical History:  Diagnosis Date  . Anxiety   . Arthritis    everywhere  . Arthrosis of right acromioclavicular joint 01/29/2020  . Atrial flutter [facility])    Onset May 2014 Cardioversion done   . BPH associated with nocturia   . BPH with obstruction/lower urinary tract symptoms 01/04/2017  . CAD (coronary artery disease) 04/02/2013   Cath 2012 moderate mid LAD lesion, otherwise not much disease   . Coronary artery disease    cardiologist-  dr Bettina Gavia (previouly dr Wynonia Lawman)  . Diabetes ([place]) 01/18/2018  . Encounter for long-term (current) use of other medications 07/27/2013  . Essential hypertension, benign   . First degree heart block   . Heart murmur   . History of bilateral knee replacement 02/07/2017  . History of bladder stone   . History of kidney stones   . History of melanoma excision    June 2016  --- s/p  excision right  leg (per pt localized and no recurrence)  . History of renal cell carcinoma  01/2017   s/p  left partial nephrecotmy  . Hypertensive heart disease 04/02/2013  . Impotence of organic origin   . Left-sided low back pain with left-sided sciatica 01/23/2019  . Long term current use of anticoagulant therapy   . Mixed hyperlipidemia    slightly  . Multiple renal cysts    bilateral  . Nephrolithiasis 05/17/2014  . Nonischemic cardiomyopathy ([place])   . Osteoarthritis   . PAF (paroxysmal atrial fibrillation) ([place])    followed by cardiology  (takes ASA)  . RBBB   . 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 Right ureteral stone   . Rotator cuff syndrome of left shoulder 04/17/2016  . Status post total left knee replacement 01/01/2020  . Tendinopathy of right biceps tendon 01/29/2020  . Tendinopathy of right rotator cuff 01/29/2020  . Type 2 diabetes mellitus treated with insulin [facility])    endocrinologist-  dr Loanne Drilling  . Wears glasses     Patient Active Problem List   Diagnosis Date Noted  . Wears glasses   . Type 2 diabetes mellitus treated with insulin ([place])   . Right ureteral stone   . RBBB   . PAF (paroxysmal atrial fibrillation) ([place])   . Multiple renal cysts   . Impotence of organic origin   . History of melanoma excision   . History of kidney stones   .  History of bladder stone   . Heart murmur   . First degree heart block   . Essential hypertension, benign   . Coronary artery disease   . BPH associated with nocturia   . Arthritis   . Anxiety   . Tendinopathy of right rotator cuff 01/29/2020  . Tendinopathy of right biceps tendon 01/29/2020  . Impingement syndrome of right shoulder 01/29/2020  . Arthrosis of right acromioclavicular joint 01/29/2020  . Chronic right shoulder pain 01/01/2020  . Status post total left knee replacement 01/01/2020  . Left-sided low back pain with left-sided sciatica 01/23/2019  . Diabetes ([place]) 01/18/2018  . History of bilateral knee replacement 02/07/2017  . Primary osteoarthritis of  left knee   . History of renal cell carcinoma 01/2017  . BPH with obstruction/lower urinary tract symptoms 01/04/2017  . Rotator cuff syndrome of left shoulder 04/17/2016  . Nephrolithiasis 05/17/2014  . Encounter for long-term (current) use of other medications 07/27/2013  . Long term current use of anticoagulant therapy   . CAD (coronary artery disease) 04/02/2013  . Hypertensive heart disease 04/02/2013  . 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 Atrial flutter ([place])   . Nonischemic cardiomyopathy ([place])   . Obesity (BMI 30-39.9)   . History of kidney stones   . Osteoarthritis   . Hyperlipidemia     Past Surgical History:  Procedure Laterality Date  . CARDIAC CATHETERIZATION  08-17-2011  DR SPENCER TILLEY   POSSIBLE MODERATE LAD STENOSIS JUST AFTER THE BIFURCATION OF LARGE DIAGONAL BRANCH/ LVEF  40-45%/ MILD GLOBAL HYPODINESIS (CATH DONE FOR ABNORMAL STRESS TEST OF FIXED LATERAL WALL DEFECT & BIFASCICULAR BLAOCK)  . CARDIOVERSION N/A 04/02/2013   Procedure: CARDIOVERSION;  Surgeon: Jacolyn Reedy, MD;  Location: [place];  Service: Cardiovascular;  Laterality: N/A;  . CYSTOSCOPY WITH LITHOLAPAXY N/A 05/18/2015   Procedure: CYSTOSCOPY WITH LITHOLAPAXY;  Surgeon: Rana Snare, MD;  Location: [facility];  Service: Urology;  Laterality: N/A;  . CYSTOSCOPY WITH LITHOLAPAXY N/A 01/04/2017   Procedure: CYSTOSCOPY WITH LITHOLAPAXY, Holmium Laser;  Surgeon: Ardis Hughs, MD;  Location: [facility];  Service: Urology;  Laterality: N/A;  . CYSTOSCOPY WITH RETROGRADE PYELOGRAM, URETEROSCOPY AND STENT PLACEMENT  11/03/2012   Procedure: CYSTOSCOPY WITH RETROGRADE PYELOGRAM, URETEROSCOPY AND STENT PLACEMENT;  Surgeon: Bernestine Amass, MD;  Location[facility];  Service: Urology;  Laterality: Left;  Cystoscopy/Left Retrograde/Ureteroscopy/Holmium Laser Litho/Double J Stent  . CYSTOSCOPY WITH URETEROSCOPY, STONE BASKETRY AND STENT  PLACEMENT Right 05/15/2019   Procedure: CYSTOSCOPY WITH URETEROSCOPY, LASER LITHOTRIPSY STONE BASKETRY AND STENT PLACEMENT, RIGHT RETROGRADE;  Surgeon: Ardis Hughs, MD;  Location: [facility];  Service: Urology;  Laterality: Right;  . HOLMIUM LASER APPLICATION  [id number]   Procedure: HOLMIUM LASER APPLICATION;  Surgeon: Bernestine Amass, MD;  Location: [facility];  Service: Urology;  Laterality: Left;  . HOLMIUM LASER APPLICATION N/A 0000000   Procedure: HOLMIUM LASER APPLICATION;  Surgeon: Rana Snare, MD;  Location: [facility];  Service: Urology;  Laterality: N/A;  . KNEE SURGERY Bilateral [place]  . MELANOMA EXCISION Bilateral June 2016   20th-- left leg //  1st-- right leg w/ skin graft from right thigh (no lymph node bx)  . NEPHROLITHOTOMY Right 05/17/2014   Procedure: NEPHROLITHOTOMY PERCUTANEOUS;  Surgeon: Bernestine Amass, MD;  Location: [facility];  Service: Urology;  Laterality: Right;  . PILONIDAL CYST EXCISION    .  ROBOTIC ASSITED PARTIAL NEPHRECTOMY Left 06/18/2013   Procedure: ROBOTIC ASSISTED PARTIAL NEPHRECTOMY;  Surgeon: Dutch Gray, MD;  Location: [facility];  Service: Urology;  Laterality: Left;  . ROTATOR CUFF REPAIR Left 07/2016  . SHOULDER ARTHROSCOPY WITH ROTATOR CUFF REPAIR AND SUBACROMIAL DECOMPRESSION Right 05/27/2020   Procedure: RIGHT SHOULDER ARTHROSCOPY WITH EXTENSIVE DEBRIDEMENT, DISTAL CLAVICLE EXCISION AND SUBACROMIAL DECOMPRESSION;  Surgeon: Leandrew Koyanagi, MD;  Location: [place];  Service: Orthopedics;  Laterality: Right;  . TONSILLECTOMY  31  (age 70)  . TOTAL KNEE ARTHROPLASTY Left 02/07/2017   Procedure: LEFT TOTAL KNEE ARTHROPLASTY;  Surgeon: Leandrew Koyanagi, MD;  Location: [place];  Service: Orthopedics;  Laterality: Left;  . TOTAL KNEE ARTHROPLASTY Right 04/25/2017   Procedure: RIGHT TOTAL KNEE ARTHROPLASTY;  Surgeon: Leandrew Koyanagi, MD;  Location: [place];  Service: Orthopedics;  Laterality: Right;   . TRANSURETHRAL RESECTION OF PROSTATE N/A 01/04/2017   Procedure: TRANSURETHRAL RESECTION OF THE PROSTATE (TURP);  Surgeon: Ardis Hughs, MD;  Location[facility];  Service: Urology;  Laterality: N/A;       Home Medications    Prior to Admission medications   Medication Sig Start Date End Date Taking? Authorizing Provider  amLODipine (NORVASC) 10 MG tablet Take 1 tablet (10 mg total) by mouth daily. 12/19/20   Richardo Priest, MD  aspirin EC 81 MG tablet Take 81 mg by mouth daily.    [provider]  Dulaglutide (TRULICITY) 4.5 BJ/6.2GB SOPN Inject 4.5 mg into the skin once a week. 12/20/20   Renato Shin, MD  glucose blood (ONETOUCH ULTRA) test strip 1 each by Other route 2 (two) times daily. E11.9 12/20/20   Renato Shin, MD  Insulin Glargine [facility]) 100 UNIT/ML Inject 70 Units into the skin daily with breakfast. 12/20/20   Renato Shin, MD  Insulin Pen Needle (PEN NEEDLES) 30G X 8 MM MISC 1 each by Does not apply route daily. E11.9 07/07/20   Renato Shin, MD  Lancets [facility] DELICA PLUS [id number]) [place] 1 each by Other route 2 (two) times daily. E11.9 07/07/20   Renato Shin, MD  metoprolol tartrate (LOPRESSOR) 50 MG tablet Take 1 tablet (50 mg total) by mouth 2 (two) times daily. 12/19/20 03/19/21  Richardo Priest, MD  nitroGLYCERIN (NITROSTAT) 0.4 MG SL tablet Place 1 tablet (0.4 mg total) under the tongue every 5 (five) minutes as needed for chest pain. 12/19/20 03/19/21  Richardo Priest, MD  rosuvastatin (CRESTOR) 10 MG tablet Take 1 tablet (10 mg total) by mouth daily. 12/19/20 03/19/21  Richardo Priest, MD  sildenafil (VIAGRA) 100 MG tablet Take 100 mg by mouth daily as needed. 05/30/18   [provider]  valsartan-hydrochlorothiazide (DIOVAN-HCT) 320-25 MG tablet Take 1 tablet by mouth daily. 12/19/20   Richardo Priest, MD    Family History Family History  Problem Relation Age of Onset  . Arthritis Father   . Hypertension Father   . Diabetes Father   .  Arthritis Mother   . Hypertension Mother   . Heart attack Brother 12  . Diabetes Brother   . Lymphoma Paternal Grandfather   . Cancer Maternal Uncle   . Cancer Paternal Uncle        type unknown    Social History Social History   Tobacco Use  . Smoking status: Former Smoker    Packs/day: 1.00    Years: 6.00    Pack years: 6.00    Types: Cigarettes  Quit date: 10/31/1969    Years since quitting: 51.1  . Smokeless tobacco: Never Used  Vaping Use  . Vaping Use: Never used  Substance Use Topics  . Alcohol use: Yes    Alcohol/week: 0.0 standard drinks    Comment: OCCASIONAL  . Drug use: No     Allergies   Actos [pioglitazone]   Review of Systems Review of Systems  Respiratory: Positive for chest tightness and shortness of breath.   Cardiovascular: Positive for chest pain and leg swelling.  Gastrointestinal: Positive for abdominal distention.  All other systems reviewed and are negative.    Physical Exam Triage Vital Signs ED Triage Vitals [12/26/20 0820]  Enc Vitals Group     BP (!) 150/105     Pulse Rate (!) 140     Resp (!) 22     Temp 98.6 F (37 C)     Temp Source Oral     SpO2 98 %     Weight      Height      Head Circumference      Peak Flow      Pain Score      Pain Loc      Pain Edu?      Excl. in GC?    No data found.  Updated Vital Signs BP (!) 150/105 (BP Location: Left Arm)   Pulse (!) 140   Temp 98.6 F (37 C) (Oral)   Resp (!) 22   SpO2 98%   Visual Acuity Right Eye Distance:   Left Eye Distance:   Bilateral Distance:    Right Eye Near:   Left Eye Near:    Bilateral Near:     Physical Exam Vitals reviewed.  Constitutional:      Appearance: He is well-developed.  HENT:     Head: Normocephalic and atraumatic.  Cardiovascular:     Rate and Rhythm: Regular rhythm. Tachycardia present.     Pulses:          Radial pulses are 2+ on the right side and 1+ on the left side.     Heart sounds: Murmur heard.    Pulmonary:      Effort: Pulmonary effort is normal.     Breath sounds: Examination of the right-lower field reveals rales. Examination of the left-lower field reveals rales. Rales present. No decreased breath sounds, wheezing or rhonchi.  Musculoskeletal:     Right lower leg: 3+ Edema present.     Left lower leg: 3+ Edema present.  Neurological:     General: No focal deficit present.     Mental Status: He is alert and oriented to person, place, and time.  Psychiatric:        Attention and Perception: Attention and perception normal.        Mood and Affect: Mood and affect normal.      UC Treatments / Results  Labs (all labs ordered are listed, but only abnormal results are displayed) Labs Reviewed - No data to display  EKG   Radiology DG Chest 2 View  Result Date: 12/26/2020 CLINICAL DATA:  Atrial fibrillation.  Cough and shortness of breath EXAM: CHEST - 2 VIEW COMPARISON:  Chest CT December 31, 2017 FINDINGS: There is mild atelectatic change in the lateral right base. The lungs elsewhere are clear. Heart is upper normal in size with pulmonary vascularity normal. No adenopathy. There are degenerative changes in the thoracic spine. IMPRESSION: Mild atelectasis lateral right base. Lungs elsewhere clear. Heart  upper normal in size. Electronically Signed   By: Lowella Grip III M.D.   On: 12/26/2020 09:32    Procedures Procedures (including critical care time)  Medications Ordered in UC Medications - No data to display  Initial Impression / Assessment and Plan / UC Course  I have reviewed the triage vital signs and the nursing notes.  Pertinent labs & imaging results that were available during my care of the patient were reviewed by me and considered in my medical decision making (see chart for details).     Patient with significant cardiac history presenting today for 3/10 left-sided chest pressure, pedal edema, shortness of breath, and intermittent dizziness. Patient attended appt  with cardiologist 1 week ago; at this time they started him on a beta-blocker (Lopressor), stopped Coreg, started nitroglycerin. Nitroglycerin is providing minimal relief today.  EKG is abnormal and patient is tachycardic at 140bpm.   I am sending this patient to [facility] ED via EMS for further cardiac workup.   Spent over 40 minutes obtaining H&P, performing physical, discussing results, treatment plan and plan for follow-up with patient. Patient agrees with plan.   This chart was dictated using voice recognition software, Dragon. Despite the best efforts of this provider to proofread and correct errors, errors may still occur which can change documentation meaning.   Final Clinical Impressions(s) / UC Diagnoses   Final diagnoses:  Other chest pain  Essential hypertension  Coronary artery disease involving native heart with other form of angina pectoris, unspecified vessel or lesion type ([place])  Nonspecific abnormal electrocardiogram (ECG) (EKG)     Discharge Instructions     Sent to [facility] ED via EMS.    ED Prescriptions    None     PDMP not reviewed this encounter.   Hazel Sams, PA-C 12/26/20 [phone]

## 2020-12-26 NOTE — H&P (Addendum)
Cardiology Admission History and Physical:   Patient ID: Matthew Schmitt MRN: [id number]; DOB: Nov 02, 1951   Admission date: 12/26/2020  Primary Care Provider: Leonides Sake, MD [facility] Cardiologist: Shirlee More, MD   Chief Complaint:  SOB, LE edema, CP  Patient Profile:   Matthew Schmitt is a 70 y.o. male with medically managed CAD, paroxysmal atrial flutter, hypertension, uncontrolled diabetes mellitus, hyperlipidemia, chronic systolic CHF, NICM 2nd to tachycardia and bifascicular block/right bundle branch block presented with worsening SOB, orthopnea, PND and LE edema who found to have afib RVR.   Previous patient of Dr. Wynonia Lawman.  Prior history of cardiomyopathy thought related to tachycardia mediated.  Previously on Xarelto for anticoagulation.  Seems it was discontinued due to hematuria. Cath in 2012 with moderate left anterior descending stenosis just after the bifurcation of a large diagonal branch. Echo in 2014 with LVEF of 25-30%. Previously tested for OSA but did not tolerated CPAP.   He was seen by Dr. Bettina Gavia December 19, 2020.  He was in 2-1 atrial flutter at rate of 160 bpm.  He was unaware of elevated heart rate.  Started beta-blocker and placed on anticoagulation with plan to notify Dr. Bettina Gavia if worsening hematuria.  Started antihypertensive and statin as well.  Hx of kidney stone with intermittent hematuria.   History of Present Illness:   [name] did not started his anticoagulation. Presented with worsening LE edema, SOB, orthopnea and PND. He has had some chest discomfort. Feeling fatigue for a while. Sleeping in recliner chronically. No palpitation, dizziness, syncope or melena. Has intermittent hematuria. He has received his COVID vaccine and booster.   Hgb A1c 8.3 on 12/20/20 Scr 1.51 (Scr was normal on 1/31) K 4.3 Hgb 15.2 Chest x-ray unremarkable   Past Medical History:  Diagnosis Date  . Anxiety   . Arthritis    everywhere  . Arthrosis of right  acromioclavicular joint 01/29/2020  . Atrial flutter [facility])    Onset May 2014 Cardioversion done   . BPH associated with nocturia   . BPH with obstruction/lower urinary tract symptoms 01/04/2017  . CAD (coronary artery disease) 04/02/2013   Cath 2012 moderate mid LAD lesion, otherwise not much disease   . Coronary artery disease    cardiologist-  dr Bettina Gavia (previouly dr Wynonia Lawman)  . Diabetes (Matthew Schmitt) 01/18/2018  . Encounter for long-term (current) use of other medications 07/27/2013  . Essential hypertension, benign   . First degree heart block   . Heart murmur   . History of bilateral knee replacement 02/07/2017  . History of bladder stone   . History of kidney stones   . History of melanoma excision    June 2016  --- s/p  excision right  leg (per pt localized and no recurrence)  . History of renal cell carcinoma 01/2017   s/p  left partial nephrecotmy  . Hypertensive heart disease 04/02/2013  . Impotence of organic origin   . Left-sided low back pain with left-sided sciatica 01/23/2019  . Long term current use of anticoagulant therapy   . Mixed hyperlipidemia    slightly  . Multiple renal cysts    bilateral  . Nephrolithiasis 05/17/2014  . Nonischemic cardiomyopathy ([place])   . Osteoarthritis   . PAF (paroxysmal atrial fibrillation) ([place])    followed by cardiology  (takes ASA)  . RBBB   . 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 Right ureteral stone   . Rotator cuff syndrome of left  shoulder 04/17/2016  . Status post total left knee replacement 01/01/2020  . Tendinopathy of right biceps tendon 01/29/2020  . Tendinopathy of right rotator cuff 01/29/2020  . Type 2 diabetes mellitus treated with insulin [facility])    endocrinologist-  dr Loanne Drilling  . Wears glasses     Past Surgical History:  Procedure Laterality Date  . CARDIAC CATHETERIZATION  08-17-2011  DR SPENCER TILLEY   POSSIBLE MODERATE LAD STENOSIS JUST AFTER THE BIFURCATION OF LARGE DIAGONAL BRANCH/  LVEF  40-45%/ MILD GLOBAL HYPODINESIS (CATH DONE FOR ABNORMAL STRESS TEST OF FIXED LATERAL WALL DEFECT & BIFASCICULAR BLAOCK)  . CARDIOVERSION N/A 04/02/2013   Procedure: CARDIOVERSION;  Surgeon: Jacolyn Reedy, MD;  Location: [facility];  Service: Cardiovascular;  Laterality: N/A;  . CYSTOSCOPY WITH LITHOLAPAXY N/A 05/18/2015   Procedure: CYSTOSCOPY WITH LITHOLAPAXY;  Surgeon: Rana Snare, MD;  Location: [facility];  Service: Urology;  Laterality: N/A;  . CYSTOSCOPY WITH LITHOLAPAXY N/A 01/04/2017   Procedure: CYSTOSCOPY WITH LITHOLAPAXY, Holmium Laser;  Surgeon: Ardis Hughs, MD;  Location: [facility];  Service: Urology;  Laterality: N/A;  . CYSTOSCOPY WITH RETROGRADE PYELOGRAM, URETEROSCOPY AND STENT PLACEMENT  11/03/2012   Procedure: CYSTOSCOPY WITH RETROGRADE PYELOGRAM, URETEROSCOPY AND STENT PLACEMENT;  Surgeon: Bernestine Amass, MD;  Location: [facility];  Service: Urology;  Laterality: Left;  Cystoscopy/Left Retrograde/Ureteroscopy/Holmium Laser Litho/Double J Stent  . CYSTOSCOPY WITH URETEROSCOPY, STONE BASKETRY AND STENT PLACEMENT Right 05/15/2019   Procedure: CYSTOSCOPY WITH URETEROSCOPY, LASER LITHOTRIPSY STONE BASKETRY AND STENT PLACEMENT, RIGHT RETROGRADE;  Surgeon: Ardis Hughs, MD;  Location: [facility];  Service: Urology;  Laterality: Right;  . HOLMIUM LASER APPLICATION  [id number]/83/3825   Procedure: HOLMIUM LASER APPLICATION;  Surgeon: Bernestine Amass, MD;  Location: [facility];  Service: Urology;  Laterality: Left;  . HOLMIUM LASER APPLICATION N/A [id number]/53/9767   Procedure: HOLMIUM LASER APPLICATION;  Surgeon: Rana Snare, MD;  Location: [facility];  Service: Urology;  Laterality: N/A;  . KNEE SURGERY Bilateral [place]  . MELANOMA EXCISION Bilateral June 2016   20th-- left leg //  1st-- right leg w/ skin graft from right thigh (no lymph node bx)  . NEPHROLITHOTOMY Right 05/17/2014   Procedure:  NEPHROLITHOTOMY PERCUTANEOUS;  Surgeon: Bernestine Amass, MD;  Location: [facility];  Service: Urology;  Laterality: Right;  . PILONIDAL CYST EXCISION    . ROBOTIC ASSITED PARTIAL NEPHRECTOMY Left 06/18/2013   Procedure: ROBOTIC ASSISTED PARTIAL NEPHRECTOMY;  Surgeon: Dutch Gray, MD;  Location[facility];  Service: Urology;  Laterality: Left;  . ROTATOR CUFF REPAIR Left 07/2016  . SHOULDER ARTHROSCOPY WITH ROTATOR CUFF REPAIR AND SUBACROMIAL DECOMPRESSION Right 05/27/2020   Procedure: RIGHT SHOULDER ARTHROSCOPY WITH EXTENSIVE DEBRIDEMENT, DISTAL CLAVICLE EXCISION AND SUBACROMIAL DECOMPRESSION;  Surgeon: Leandrew Koyanagi, MD;  Location: [place];  Service: Orthopedics;  Laterality: Right;  . TONSILLECTOMY  20  (age 56)  . TOTAL KNEE ARTHROPLASTY Left 02/07/2017   Procedure: LEFT TOTAL KNEE ARTHROPLASTY;  Surgeon: Leandrew Koyanagi, MD;  Location: [place];  Service: Orthopedics;  Laterality: Left;  . TOTAL KNEE ARTHROPLASTY Right 04/25/2017   Procedure: RIGHT TOTAL KNEE ARTHROPLASTY;  Surgeon: Leandrew Koyanagi, MD;  Location: [place];  Service: Orthopedics;  Laterality: Right;  . TRANSURETHRAL RESECTION OF PROSTATE N/A 01/04/2017   Procedure: TRANSURETHRAL RESECTION OF THE PROSTATE (TURP);  Surgeon: Ardis Hughs, MD;  [facility];  Service: Urology;  Laterality: N/A;  Medications Prior to Admission: Prior to Admission medications   Medication Sig Start Date End Date Taking? Authorizing Provider  amLODipine (NORVASC) 10 MG tablet Take 1 tablet (10 mg total) by mouth daily. 12/19/20  Yes Richardo Priest, MD  aspirin EC 81 MG tablet Take 81 mg by mouth daily.   Yes [provider]  Dulaglutide (TRULICITY) 4.5 0000000 SOPN Inject 4.5 mg into the skin once a week. Patient taking differently: Inject 4.5 mg into the skin once a week. Friday 12/20/20  Yes Renato Shin, MD  Insulin Glargine [facility]) 100 UNIT/ML Inject 70 Units into the skin daily with breakfast. 12/20/20  Yes Renato Shin, MD  metoprolol tartrate (LOPRESSOR) 50 MG tablet Take 1 tablet (50 mg total) by mouth 2 (two) times daily. 12/19/20 03/19/21 Yes Richardo Priest, MD  naproxen sodium (ALEVE) 220 MG tablet Take 440 mg by mouth daily as needed (pain).   Yes [provider]  nitroGLYCERIN (NITROSTAT) 0.4 MG SL tablet Place 1 tablet (0.4 mg total) under the tongue every 5 (five) minutes as needed for chest pain. 12/19/20 03/19/21 Yes Richardo Priest, MD  rosuvastatin (CRESTOR) 10 MG tablet Take 1 tablet (10 mg total) by mouth daily. 12/19/20 03/19/21 Yes Richardo Priest, MD  sildenafil (VIAGRA) 100 MG tablet Take 100 mg by mouth daily as needed for erectile dysfunction. 05/30/18  Yes [provider]  trolamine salicylate (ASPERCREME) 10 % cream Apply 1 application topically as needed for muscle pain.   Yes [provider]  valsartan-hydrochlorothiazide (DIOVAN-HCT) 320-25 MG tablet Take 1 tablet by mouth daily. 12/19/20  Yes Richardo Priest, MD  glucose blood (ONETOUCH ULTRA) test strip 1 each by Other route 2 (two) times daily. E11.9 12/20/20   Renato Shin, MD  Insulin Pen Needle (PEN NEEDLES) 30G X 8 MM MISC 1 each by Does not apply route daily. E11.9 07/07/20   Renato Shin, MD  Lancets [facility] DELICA PLUS [id number]) [place] 1 each by Other route 2 (two) times daily. E11.9 07/07/20   Renato Shin, MD     Allergies:    Allergies  Allergen Reactions  . Actos [Pioglitazone] Swelling and Cough    SWELLING REACTION UNSPECIFIED  EDEMA    Social History:   Social History   Socioeconomic History  . Marital status: Married    Spouse name: Not on file  . Number of children: 2  . Years of education: 24  . Highest education level: Not on file  Occupational History  . Occupation: Government social research officer, Psychologist, occupational  Tobacco Use  . Smoking status: Former Smoker    Packs/day: 1.00    Years: 6.00    Pack years: 6.00    Types: Cigarettes    Quit date: 10/31/1969    Years since quitting: 51.1   . Smokeless tobacco: Never Used  Vaping Use  . Vaping Use: Never used  Substance and Sexual Activity  . Alcohol use: Yes    Alcohol/week: 0.0 standard drinks    Comment: OCCASIONAL  . Drug use: No  . Sexual activity: Never  Other Topics Concern  . Not on file  Social History Narrative   Regular exercise-no   Social Determinants of Health   Financial Resource Strain: Not on file  Food Insecurity: Not on file  Transportation Needs: Not on file  Physical Activity: Not on file  Stress: Not on file  Social Connections: Not on file  Intimate Partner Violence: Not on file    Family History:  The patient's family history includes Arthritis in his father and mother; Cancer in his maternal uncle and paternal uncle; Diabetes in his brother and father; Heart attack (age of onset: 15) in his brother; Hypertension in his father and mother; Lymphoma in his paternal grandfather.    ROS:  Please see the history of present illness.  All other ROS reviewed and negative.     Physical Exam/Data:   Vitals:   12/26/20 1245 12/26/20 1300 12/26/20 1330 12/26/20 1345  BP: (!) 127/91 127/90 112/76 (!) 127/91  Pulse: (!) 139 (!) 139  (!) 138  Resp: 13 17 17  (!) 21  Temp:      TempSrc:      SpO2: 97% 95%  95%   No intake or output data in the 24 hours ending 12/26/20 1442 Last 3 Weights 12/20/2020 12/19/2020 08/19/2020  Weight (lbs) 234 lb 12.8 oz 235 lb 233 lb  Weight (kg) 106.505 kg 106.595 kg 105.688 kg     There is no height or weight on file to calculate BMI.  General:  Well nourished, well developed, in no acute distress HEENT: normal Lymph: no adenopathy Neck: no  JVD Endocrine:  No thryomegaly Vascular: No carotid bruits; FA pulses 2+ bilaterally without bruits  Cardiac:  normal S1, S2; Regular tachycardia ; no murmur Lungs:  clear to auscultation bilaterally, no wheezing, rhonchi or rales  Abd: soft, nontender, no hepatomegaly  Ext: 2+  Edema with venous stasis skin changes and  whooping fluids   Musculoskeletal:  No deformities, BUE and BLE strength normal and equal Skin: warm and dry  Neuro:  CNs 2-12 intact, no focal abnormalities noted Psych:  Normal affect    EKG:  The ECG that was done today was personally reviewed and demonstrates Atrial flutter, Bifascicular block  Relevant CV Studies:  Cath 08/2011 ANGIOGRAPHIC DATA:  Left ventriculogram:  Performed in the 30-degree RAO projection.  Mitral valve is normal.  The left ventricle appears dilated.  There appears to be global hypokinesis noted with an estimated ejection fraction of 40-45%.  Coronary arteries rise and distribute normally.  There is very minimal coronary calcification noted.  Left main coronary artery is normal.  Coronaries are large.  Left anterior descending has a large diagonal branch which bifurcates into 3 branches. It contains scattered irregularities.  Just after the bifurcation of the diagonal branch, appears to be an eccentric moderate LAD stenosis of 60%; however, there is streaming around this vessel despite high blood flow, so it is hard to tell the severity of this lesion does not appear critical, however.  Circumflex coronary artery is a codominant vessel supplying a twin posterior descending artery.  A large first marginal branch arises.  There is no significant atherosclerotic disease involving the circumflex.  The right coronary artery ends in a posterior descending artery.  It is a large vessel and contains only scattered irregularities.  IMPRESSION: 1. Possible moderate left anterior descending stenosis just after the     bifurcation of a large diagonal branch. 2. Abnormal left ventricular function with an ejection fraction of 40-     45% mild global hypokinesis.  RECOMMENDATIONS:  Intensive blood pressure control, risk factor modification.  Laboratory Data:  High Sensitivity Troponin:  No results for input(s): TROPONINIHS in the last 720 hours.     Chemistry Recent Labs  Lab 12/19/20 1656 12/26/20 0922  NA 141 141  K 4.3 4.3  CL 106 109  CO2 23 24  GLUCOSE 223* 250*  BUN 24  26*  CREATININE 1.04 1.51*  CALCIUM 9.0 8.7*  GFRNONAA 72 49*  GFRAA 84  --   ANIONGAP  --  8    Recent Labs  Lab 12/19/20 1656  PROT 5.5*  ALBUMIN 3.8  AST 26  ALT 41  ALKPHOS 69  BILITOT 0.8   Hematology Recent Labs  Lab 12/19/20 1656 12/26/20 0922  WBC 9.6 6.8  RBC 6.03* 5.23  HGB 17.3 15.2  HCT 51.3* 45.6  MCV 85 87.2  MCH 28.7 29.1  MCHC 33.7 33.3  RDW 13.2 14.0  PLT 168 103*   BNP Recent Labs  Lab 12/19/20 1656  PROBNP 3,563*    Radiology/Studies:  DG Chest 2 View  Result Date: 12/26/2020 CLINICAL DATA:  Atrial fibrillation.  Cough and shortness of breath EXAM: CHEST - 2 VIEW COMPARISON:  Chest CT December 31, 2017 FINDINGS: There is mild atelectatic change in the lateral right base. The lungs elsewhere are clear. Heart is upper normal in size with pulmonary vascularity normal. No adenopathy. There are degenerative changes in the thoracic spine. IMPRESSION: Mild atelectasis lateral right base. Lungs elsewhere clear. Heart upper normal in size. Electronically Signed   By: Lowella Grip III M.D.   On: 12/26/2020 09:32    Assessment and Plan:   1. Atrial Flutter with RVR - Hx of this with prior cardioversion. Patient is symptomatic with elevated HR. Goal to maintain sinus rhythm.  - Start IV amiodarone with load and infusion. Resume home metoprolol 50mg  BID. Watch for bradycardia given bifascicular block at baseline.  - Check TSH and Echo - Start Eliquis. Tentatively scheduled for TEE/ DCCV tomorrow at 11am. Plan to give 3 dose of Eliquis prior to procedure. Watch for worsening hematuria.  -  After careful review of history and examination, the risks and benefits of transesophageal echocardiogram have been explained including risks of esophageal damage, perforation (1:10,000 risk), bleeding, pharyngeal hematoma as well as  other potential complications associated with conscious sedation including aspiration, arrhythmia, respiratory failure and death. Alternatives to treatment were discussed, questions were answered. Patient is willing to proceed.   2. Hx of tachy mediated cardiomyopathy - Echo in 2014 with LVEF of 25-30%. - He clearly has sign of volume overload on exam - Recent pro-bnp > 3000 - Check BNP - Start lasix IV 40mg  BID. Strict I & O and daily weight - Get echo - Continue home metoprolol 50mg  BID. If low EF, consolidate to Toprol XL - Hold home Valsartan/HCTZ  3. AKI - Scr 1.5. was normal on 1/31  - Follow closely with diuresis  4. CAD - Moderate LAD stenosis by cath in 2012 - Intermittent chest pain  - Rate related ST depression on EKG - Hold ASA while on anticoagulation given intermittent hematuria  5. DM - SSI while here  6. HTN - Hold Valsartan/HCTZ and home amlodipine - Continue BB  7. HLD - Increase home Crestor to 20mg  qd from 10mg  qd  - 12/19/2020: Cholesterol, Total 149; HDL 42; LDL Chol Calc (NIH) 79; Triglycerides 165  8. OSA - Did not tolerated CPAP in past - Not interested in repeat study  Risk Assessment/Risk Scores:  [id number]}  New York Heart Association (NYHA) Functional Class NYHA Class III  CHA2DS2-VASc Score = 5  This indicates a 7.2% annual risk of stroke. The patient's score is based upon: CHF History: Yes HTN History: Yes Diabetes History: Yes Stroke History: No Vascular Disease History: Yes Age Score: 1 Gender Score: 0   Severity of Illness:  The appropriate patient status for this patient is INPATIENT. Inpatient status is judged to be reasonable and necessary in order to provide the required intensity of service to ensure the patient's safety. The patient's presenting symptoms, physical exam findings, and initial radiographic and laboratory data in the context of their chronic comorbidities is felt to place them at high risk for further clinical  deterioration. Furthermore, it is not anticipated that the patient will be medically stable for discharge from the hospital within 2 midnights of admission. The following factors support the patient status of inpatient.   " The patient's presenting symptoms include  SOB, CP, orthopnea and LE edema . " The worrisome physical exam findings include Volume overload  " The initial radiographic and laboratory data are worrisome because of abnormal EKG " The chronic co-morbidities include CAD, CHG, DM, HTN  * I certify that at the point of admission it is my clinical judgment that the patient will require inpatient hospital care spanning beyond 2 midnights from the point of admission due to high intensity of service, high risk for further deterioration and high frequency of surveillance required.*    For questions or updates, please contact [place] Please consult [email or web address] for contact info under     Jarrett Soho, PA  12/26/2020 2:42 PM   Personally seen and examined. Agree with above.   70 year old here with rapid atrial flutter with previous nonischemic cardiomyopathy secondary to tachycardia with right bundle branch block bifascicular block noted.  Fairly comfortable currently in the emergency room.  Has noticed increased shortness of breath, mild orthopnea, perhaps mild chest discomfort.  GEN: Well nourished, well developed, in no acute distress  HEENT: normal  Neck: no JVD, carotid bruits, or masses Cardiac: Tachycardic regular; no murmurs, rubs, or gallops, tight, hyperpigmented lower extremities with blister noted right lower, leaking edema  Respiratory:  clear to auscultation bilaterally, normal work of breathing GI: soft, nontender, nondistended, + BS MS: no deformity or atrophy  Skin: warm and dry, no rash Neuro:  Alert and Oriented x 3, Strength and sensation are intact Psych: euthymic mood, full affect  Creatinine 1.5  Assessment and plan:  Atrial  flutter with rapid ventricular response -TEE cardioversion -IV amiodarone-hopefully short-term duration -P.o. metoprolol 50 twice daily -Watch for any signs of worsening conduction given his right bundle branch block and left anterior fascicular block.  Chronic anticoagulation -We will give him 2 doses of Eliquis today then a third dose tomorrow morning.    Tachycardia mediated cardiomyopathy -Has had an EF of 25 to 30% in the past in 2014 in the setting of rapid atrial flutter.  Certainly could be similar today.  Unsure how long he has been going 140 bpm.  He does state that he is taking his blood pressure at home in the not too distant past and was not alerted by his blood pressure cuff for tachycardia. -We will give him IV Lasix, legs are tight, weeping, chronic.  AKI -Creatinine 1.5, could be from hypoperfusion given his tach arrhythmia.  Continue to monitor.  Coronary artery disease -Moderate LAD stenosis in 2012. -Hold aspirin while on Eliquis.  OSA -Has been diagnosed with this but he does not utilize CPAP mask.  Not interested.  Candee Furbish, MD

## 2020-12-26 NOTE — ED Notes (Signed)
EMS arrived for transport to ED.

## 2020-12-27 ENCOUNTER — Encounter (HOSPITAL_COMMUNITY)
Admission: EM | Disposition: A | Discharge status: Home / Self Care | Payer: Self-pay | Source: Home / Self Care | Attending: Cardiology

## 2020-12-27 ENCOUNTER — Encounter (HOSPITAL_COMMUNITY): Payer: Self-pay | Admitting: Cardiology

## 2020-12-27 ENCOUNTER — Other Ambulatory Visit: Payer: Self-pay

## 2020-12-27 ENCOUNTER — Inpatient Hospital Stay (HOSPITAL_COMMUNITY): Payer: BC Managed Care – PPO

## 2020-12-27 ENCOUNTER — Inpatient Hospital Stay (HOSPITAL_COMMUNITY): Payer: BC Managed Care – PPO | Admitting: Certified Registered"

## 2020-12-27 DIAGNOSIS — I350 Nonrheumatic aortic (valve) stenosis: Secondary | ICD-10-CM

## 2020-12-27 DIAGNOSIS — I4892 Unspecified atrial flutter: Secondary | ICD-10-CM

## 2020-12-27 DIAGNOSIS — I351 Nonrheumatic aortic (valve) insufficiency: Secondary | ICD-10-CM | POA: Diagnosis not present

## 2020-12-27 DIAGNOSIS — I340 Nonrheumatic mitral (valve) insufficiency: Secondary | ICD-10-CM

## 2020-12-27 HISTORY — PX: TEE WITH CARDIOVERSION: SHX5442

## 2020-12-27 HISTORY — PX: CARDIOVERSION: SHX1299

## 2020-12-27 HISTORY — PX: TEE WITHOUT CARDIOVERSION: SHX5443

## 2020-12-27 LAB — CBC
HCT: 44.3 % (ref 39.0–52.0)
Hemoglobin: 13.7 g/dL (ref 13.0–17.0)
MCH: 27.8 pg (ref 26.0–34.0)
MCHC: 30.9 g/dL (ref 30.0–36.0)
MCV: 89.9 fL (ref 80.0–100.0)
Platelets: 97 10*3/uL — ABNORMAL LOW (ref 150–400)
RBC: 4.93 MIL/uL (ref 4.22–5.81)
RDW: 14.2 % (ref 11.5–15.5)
WBC: 4.9 10*3/uL (ref 4.0–10.5)
nRBC: 0 % (ref 0.0–0.2)

## 2020-12-27 LAB — ECHOCARDIOGRAM COMPLETE
AR max vel: 1.1 cm2
AV Area VTI: 1.01 cm2
AV Area mean vel: 1.12 cm2
AV Mean grad: 12 mmHg
AV Peak grad: 19.7 mmHg
Ao pk vel: 2.22 m/s
Area-P 1/2: 3.36 cm2
Calc EF: 23.9 %
P 1/2 time: 535 msec
S' Lateral: 4.4 cm
Single Plane A2C EF: 29.3 %
Single Plane A4C EF: 18.4 %

## 2020-12-27 LAB — ECHO TEE
MV M vel: 4.66 m/s
MV Peak grad: 86.9 mmHg
Radius: 0.7 cm

## 2020-12-27 LAB — BASIC METABOLIC PANEL
Anion gap: 10 (ref 5–15)
BUN: 23 mg/dL (ref 8–23)
CO2: 25 mmol/L (ref 22–32)
Calcium: 8.8 mg/dL — ABNORMAL LOW (ref 8.9–10.3)
Chloride: 107 mmol/L (ref 98–111)
Creatinine, Ser: 1.56 mg/dL — ABNORMAL HIGH (ref 0.61–1.24)
GFR, Estimated: 47 mL/min — ABNORMAL LOW (ref >60)
Glucose, Bld: 166 mg/dL — ABNORMAL HIGH (ref 70–99)
Potassium: 3.5 mmol/L (ref 3.5–5.1)
Sodium: 142 mmol/L (ref 135–145)

## 2020-12-27 LAB — GLUCOSE, CAPILLARY
Glucose-Capillary: 154 mg/dL — ABNORMAL HIGH (ref 70–99)
Glucose-Capillary: 176 mg/dL — ABNORMAL HIGH (ref 70–99)
Glucose-Capillary: 147 mg/dL — ABNORMAL HIGH (ref 70–99)

## 2020-12-27 LAB — CBG MONITORING, ED: Glucose-Capillary: 182 mg/dL — ABNORMAL HIGH (ref 70–99)

## 2020-12-27 SURGERY — ECHOCARDIOGRAM, TRANSESOPHAGEAL
Anesthesia: Monitor Anesthesia Care

## 2020-12-27 MED ORDER — PROPOFOL 500 MG/50ML IV EMUL
INTRAVENOUS | Status: DC | PRN
  Administered 2020-12-27: 150 ug/kg/min via INTRAVENOUS

## 2020-12-27 MED ORDER — BUTAMBEN-TETRACAINE-BENZOCAINE 2-2-14 % EX AERO
INHALATION_SPRAY | CUTANEOUS | Status: DC | PRN
  Administered 2020-12-27: 1 via TOPICAL

## 2020-12-27 MED ORDER — PHENYLEPHRINE 40 MCG/ML (10ML) SYRINGE FOR IV PUSH (FOR BLOOD PRESSURE SUPPORT)
PREFILLED_SYRINGE | INTRAVENOUS | Status: DC | PRN
  Administered 2020-12-27: 160 ug via INTRAVENOUS
  Administered 2020-12-27 (×2): 120 ug via INTRAVENOUS

## 2020-12-27 MED ORDER — EPHEDRINE SULFATE-NACL 50-0.9 MG/10ML-% IV SOSY
PREFILLED_SYRINGE | INTRAVENOUS | Status: DC | PRN
  Administered 2020-12-27: 15 mg via INTRAVENOUS

## 2020-12-27 NOTE — Anesthesia Postprocedure Evaluation (Signed)
Anesthesia Post Note  Patient: Matthew Schmitt  Procedure(s) Performed: TRANSESOPHAGEAL ECHOCARDIOGRAM (TEE) (N/A ) CARDIOVERSION (N/A )     Patient location during evaluation: Endoscopy Anesthesia Type: MAC Level of consciousness: awake and alert Pain management: pain level controlled Vital Signs Assessment: post-procedure vital signs reviewed and stable Respiratory status: spontaneous breathing, nonlabored ventilation and respiratory function stable Cardiovascular status: blood pressure returned to baseline and stable Postop Assessment: no apparent nausea or vomiting Anesthetic complications: no   No complications documented.  Last Vitals:  Vitals:   12/27/20 1133 12/27/20 1143  BP: 114/64 118/80  Pulse: 69 69  Resp: 19 16  Temp:    SpO2: 96% 96%    Last Pain:  Vitals:   12/27/20 1143  TempSrc:   PainSc: 0-No pain                 Lidia Collum

## 2020-12-27 NOTE — Progress Notes (Addendum)
Progress Note  Patient Name: Matthew Schmitt Date of Encounter: 12/27/2020  [facility] Cardiologist: Shirlee More, MD (will be followed by Dr. Marlou Porch)  Subjective   No chest pain.  Unable to sleep last night due to frequent machine beeping and interruption.  Inpatient Medications    Scheduled Meds: . apixaban  5 mg Oral BID  . furosemide  40 mg Intravenous BID  . insulin aspart  0-15 Units Subcutaneous TID WC  . metoprolol tartrate  50 mg Oral BID  . rosuvastatin  20 mg Oral Daily   Continuous Infusions: . sodium chloride 20 mL/hr at 12/27/20 1062  . amiodarone 30 mg/hr (12/26/20 2038)   PRN Meds: acetaminophen, nitroGLYCERIN, ondansetron (ZOFRAN) IV   Vital Signs    Vitals:   12/27/20 0400 12/27/20 0500 12/27/20 0600 12/27/20 0615  BP: (!) 121/95 (!) 149/96 119/77   Pulse: (!) 128 (!) 33 64   Resp: 10 11 (!) 22 (!) 22  Temp:      TempSrc:      SpO2: 97% 91% 95%     Intake/Output Summary (Last 24 hours) at 12/27/2020 0806 Last data filed at 12/26/2020 1811 Gross per 24 hour  Intake 200 ml  Output 1400 ml  Net -1200 ml   Last 3 Weights 12/20/2020 12/19/2020 08/19/2020  Weight (lbs) 234 lb 12.8 oz 235 lb 233 lb  Weight (kg) 106.505 kg 106.595 kg 105.688 kg      Telemetry    Atrial flutter at rate of 130 Seems rhythm was atrial fibrillation between 11:30 PM to 6 AM at rate of 80 to 90s - Personally Reviewed  ECG    Atrial fibrillation/flutter at rate of 109 bpm, right bundle branch block- Personally Reviewed  Physical Exam   GEN: No acute distress.   Neck: No JVD Cardiac: regular tachycardic , no murmurs, rubs, or gallops.  Respiratory: Clear to auscultation bilaterally. GI: Soft, nontender, non-distended  MS: 1+ BL LE edema; No deformity. Neuro:  Nonfocal  Psych: Normal affect   Labs    Chemistry Recent Labs  Lab 12/26/20 0922 12/27/20 0401  NA 141 142  K 4.3 3.5  CL 109 107  CO2 24 25  GLUCOSE 250* 166*  BUN 26* 23  CREATININE 1.51*  1.56*  CALCIUM 8.7* 8.8*  GFRNONAA 49* 47*  ANIONGAP 8 10     Hematology Recent Labs  Lab 12/26/20 0922 12/27/20 0401  WBC 6.8 4.9  RBC 5.23 4.93  HGB 15.2 13.7  HCT 45.6 44.3  MCV 87.2 89.9  MCH 29.1 27.8  MCHC 33.3 30.9  RDW 14.0 14.2  PLT 103* 97*    BNP Recent Labs  Lab 12/26/20 1436  BNP 412.0*      Radiology    DG Chest 2 View  Result Date: 12/26/2020 CLINICAL DATA:  Atrial fibrillation.  Cough and shortness of breath EXAM: CHEST - 2 VIEW COMPARISON:  Chest CT December 31, 2017 FINDINGS: There is mild atelectatic change in the lateral right base. The lungs elsewhere are clear. Heart is upper normal in size with pulmonary vascularity normal. No adenopathy. There are degenerative changes in the thoracic spine. IMPRESSION: Mild atelectasis lateral right base. Lungs elsewhere clear. Heart upper normal in size. Electronically Signed   By: Lowella Grip III M.D.   On: 12/26/2020 09:32    Cardiac Studies   Pending echo and TEE  Patient Profile     70 y.o. male with medically managed CAD, paroxysmal atrial flutter, hypertension, uncontrolled diabetes  mellitus, hyperlipidemia, chronic systolic CHF, NICM 2nd to tachycardia and bifascicular block/right bundle branch block presented with worsening SOB, orthopnea, PND and LE edema who found to have afib RVR.   Hx of atrial flutter with prior cardioversion.   Assessment & Plan     1. Atrial Flutter with RVR - Started IV amiodarone with load and infusion. Resumed home metoprolol 50mg  BID. Watch for bradycardia given RBBB & LAFB at baseline.  -HR rate was 80 to 90s from 10:30 PM to 6 AM with underlying rhythm of atrial fibrillation.  Patient currently in atrial flutter at rate of 130 bpm - Started Eliquis>>Watch for worsening hematuria.  - For TEE/DCCV today  - TSH normal   2. Hx of tachy mediated cardiomyopathy - Echo in 2014 with LVEF of 25-30%. - He clearly has sign of volume overload on exam - BNP 412 -  Edema improving on IV lasix. Diuresed 1.2L.  - Continue home metoprolol 50mg  BID. If low EF, consolidate to Toprol XL - Hold home Valsartan/HCTZ  3. AKI - Scr was normal on 1/31  -SCr 1.51>>1.56  4. CAD - Moderate LAD stenosis by cath in 2012 - Intermittent chest pain  - Rate related ST depression on EKG - Hold ASA while on anticoagulation given intermittent hematuria  5. DM - SSI while here  6. HTN - Hold Valsartan/HCTZ and home amlodipine - Continue BB - BP stable   7. HLD - Increase home Crestor to 20mg  qd from 10mg  qd  - 12/19/2020: Cholesterol, Total 149; HDL 42; LDL Chol Calc (NIH) 79; Triglycerides 165  8. OSA - Did not tolerated CPAP in past - Not interested in repeat study   For questions or updates, please contact [place] Please consult [email or web address] for contact info under        SignedLeanor Kail, PA  12/27/2020, 8:06 AM    Personally seen and examined. Agree with above.   70 year old with atrial flutter awaiting TEE cardioversion.  Saw him in endoscopy.  Amiodarone has been utilized overnight IV.  Has received 3 doses of Eliquis.  On exam, 2+ lower extremity edema, weeping noted. Lungs clear heart tachycardic regular.  Fairly incessant atrial flutter.  Previously patient had tachycardia mediated cardiomyopathy.  I would not be surprised if his EF is once again low.  IV Lasix has been administered.  1.2 L out.  Candee Furbish, MD

## 2020-12-27 NOTE — CV Procedure (Signed)
Brief TEE Note  LVEF <20% No LA/LAA thrombus or masses Moderate right ventricular systolic dysfunction Bicuspid aortic valve with fusion of the left and right coronary cusps Moderate aortic regurgitation Mild ascending aorta aneurysm Trivial tricuspid regurgitation Mild pulmonic regurgitation Moderate mitral regurgitation with multiple regurgitant jets.  For additional details see full report.  Electrical Cardioversion Procedure Note Matthew Schmitt [id number] 19-Feb-1951  Procedure: Electrical Cardioversion Indications:  Atrial Fibrillation and Atrial Flutter  Procedure Details Consent: Risks of procedure as well as the alternatives and risks of each were explained to the (patient/caregiver).  Consent for procedure obtained. Time Out: Verified patient identification, verified procedure, site/side was marked, verified correct patient position, special equipment/implants available, medications/allergies/relevent history reviewed, required imaging and test results available.  Performed  Patient placed on cardiac monitor, pulse oximetry, supplemental oxygen as necessary.  Sedation given: propofol Pacer pads placed anterior and posterior chest.  Cardioverted 1 time(s).  Cardioverted at [place].  Evaluation Findings: Post procedure EKG shows: NSR Complications: None Patient did tolerate procedure well.   Skeet Latch, MD 12/27/2020, 11:16 AM

## 2020-12-27 NOTE — ED Notes (Signed)
Tele

## 2020-12-27 NOTE — Anesthesia Preprocedure Evaluation (Signed)
Anesthesia Evaluation  Patient identified by MRN, date of birth, ID band Patient awake    Reviewed: Allergy & Precautions, NPO status , Patient's Chart, lab work & pertinent test results  History of Anesthesia Complications Negative for: history of anesthetic complications  Airway Mallampati: II  TM Distance: >3 FB Neck ROM: Full    Dental   Pulmonary neg pulmonary ROS, former smoker,    Pulmonary exam normal        Cardiovascular hypertension, + CAD and +CHF  + dysrhythmias Atrial Fibrillation  Rhythm:Irregular Rate:Tachycardia     Neuro/Psych negative neurological ROS  negative psych ROS   GI/Hepatic negative GI ROS, Neg liver ROS,   Endo/Other  diabetes, Type 2, Insulin Dependent  Renal/GU Renal disease (h/o RCC s/p partial nephrectomy)  negative genitourinary   Musculoskeletal  (+) Arthritis ,   Abdominal   Peds  Hematology negative hematology ROS (+)   Anesthesia Other Findings  Echo 2014: - Left ventricle: The cavity size was normal. There was  severe concentric hypertrophy. Systolic function was  severely reduced. The estimated ejection fraction was in  the range of 25% to 30%. Diffuse hypokinesis.  - Mitral valve: Mild regurgitation.  - Left atrium: The atrium was moderately dilated.  - Right ventricle: The cavity size was mildly dilated. Wall  thickness was normal.    Reproductive/Obstetrics                             Anesthesia Physical Anesthesia Plan  ASA: III  Anesthesia Plan: MAC   Post-op Pain Management:    Induction: Intravenous  PONV Risk Score and Plan: 1 and Propofol infusion, TIVA and Treatment may vary due to age or medical condition  Airway Management Planned: Natural Airway, Nasal Cannula and Simple Face Mask  Additional Equipment: None  Intra-op Plan:   Post-operative Plan:   Informed Consent: I have reviewed the patients History and  Physical, chart, labs and discussed the procedure including the risks, benefits and alternatives for the proposed anesthesia with the patient or authorized representative who has indicated his/her understanding and acceptance.       Plan Discussed with:   Anesthesia Plan Comments:         Anesthesia Quick Evaluation

## 2020-12-27 NOTE — ED Notes (Signed)
Breakfast Ordered 

## 2020-12-27 NOTE — Progress Notes (Signed)
Echocardiogram Echocardiogram Transesophageal has been performed.  Oneal Deputy Omer Puccinelli 12/27/2020, 11:19 AM

## 2020-12-27 NOTE — Transfer of Care (Signed)
Immediate Anesthesia Transfer of Care Note  Patient: Matthew Schmitt  Procedure(s) Performed: TRANSESOPHAGEAL ECHOCARDIOGRAM (TEE) (N/A ) CARDIOVERSION (N/A )  Patient Location: Endoscopy Unit  Anesthesia Type:MAC  Level of Consciousness: drowsy and patient cooperative  Airway & Oxygen Therapy: Patient Spontanous Breathing and Patient connected to face mask oxygen  Post-op Assessment: Report given to RN  Post vital signs: Reviewed and stable  Last Vitals:  Vitals Value Taken Time  BP 105/66 12/27/20 1125  Temp 36.6 C 12/27/20 1123  Pulse 69 12/27/20 1126  Resp 14 12/27/20 1126  SpO2 97 % 12/27/20 1126  Vitals shown include unvalidated device data.  Last Pain:  Vitals:   12/27/20 1123  TempSrc: Tympanic  PainSc:          Complications: No complications documented.

## 2020-12-28 ENCOUNTER — Telehealth: Payer: Self-pay | Admitting: Physician Assistant

## 2020-12-28 ENCOUNTER — Encounter (HOSPITAL_COMMUNITY): Payer: Self-pay | Admitting: Cardiovascular Disease

## 2020-12-28 LAB — GLUCOSE, CAPILLARY
Glucose-Capillary: 162 mg/dL — ABNORMAL HIGH (ref 70–99)
Glucose-Capillary: 298 mg/dL — ABNORMAL HIGH (ref 70–99)
Glucose-Capillary: 89 mg/dL (ref 70–99)
Glucose-Capillary: 217 mg/dL — ABNORMAL HIGH (ref 70–99)

## 2020-12-28 LAB — BASIC METABOLIC PANEL
Anion gap: 12 (ref 5–15)
BUN: 25 mg/dL — ABNORMAL HIGH (ref 8–23)
CO2: 25 mmol/L (ref 22–32)
Calcium: 8.7 mg/dL — ABNORMAL LOW (ref 8.9–10.3)
Chloride: 104 mmol/L (ref 98–111)
Creatinine, Ser: 1.39 mg/dL — ABNORMAL HIGH (ref 0.61–1.24)
GFR, Estimated: 55 mL/min — ABNORMAL LOW (ref >60)
Glucose, Bld: 176 mg/dL — ABNORMAL HIGH (ref 70–99)
Potassium: 3.8 mmol/L (ref 3.5–5.1)
Sodium: 141 mmol/L (ref 135–145)

## 2020-12-28 MED ORDER — METOPROLOL SUCCINATE ER 50 MG PO TB24: 50.0000 mg | ORAL_TABLET | Freq: Every day | ORAL | Status: DC

## 2020-12-28 MED ORDER — OFF THE BEAT BOOK
Freq: Once | Status: AC
  Filled 2020-12-28: qty 1

## 2020-12-28 MED ORDER — AMIODARONE HCL 200 MG PO TABS
200.0000 mg | ORAL_TABLET | Freq: Two times a day (BID) | ORAL | Status: DC
  Administered 2020-12-28 – 2020-12-29 (×3): 200 mg via ORAL
  Filled 2020-12-28 (×3): qty 1

## 2020-12-28 MED ORDER — METOPROLOL TARTRATE 50 MG PO TABS
50.0000 mg | ORAL_TABLET | Freq: Once | ORAL | Status: AC
  Administered 2020-12-28: 50 mg via ORAL
  Filled 2020-12-28: qty 1

## 2020-12-28 MED ORDER — METOPROLOL SUCCINATE ER 50 MG PO TB24
50.0000 mg | ORAL_TABLET | Freq: Every day | ORAL | Status: DC
  Administered 2020-12-29: 50 mg via ORAL
  Filled 2020-12-28: qty 1

## 2020-12-28 MED ORDER — SACUBITRIL-VALSARTAN 24-26 MG PO TABS
1.0000 | ORAL_TABLET | Freq: Two times a day (BID) | ORAL | Status: DC
  Administered 2020-12-28 – 2020-12-29 (×3): 1 via ORAL
  Filled 2020-12-28 (×3): qty 1

## 2020-12-28 NOTE — Progress Notes (Addendum)
Progress Note  Patient Name: Matthew Schmitt Date of Encounter: 12/28/2020  [facility] Cardiologist: Candee Furbish, MD   Subjective   Breathing normalized.  Resolved lower extremity edema.  No chest pain.  No palpitation.  Encouraged for lifestyle changes  Inpatient Medications    Scheduled Meds: . apixaban  5 mg Oral BID  . furosemide  40 mg Intravenous BID  . insulin aspart  0-15 Units Subcutaneous TID WC  . metoprolol tartrate  50 mg Oral BID  . rosuvastatin  20 mg Oral Daily   Continuous Infusions: . amiodarone 30 mg/hr (12/28/20 0206)   PRN Meds: acetaminophen, nitroGLYCERIN, ondansetron (ZOFRAN) IV   Vital Signs    Vitals:   12/27/20 2025 12/27/20 2126 12/28/20 0454 12/28/20 0750  BP: 126/80 135/79  133/87  Pulse: 72 73  66  Resp: 18  16 16   Temp: 98.2 F (36.8 C)  97.9 F (36.6 C) 98 F (36.7 C)  TempSrc: Oral  Oral Oral  SpO2: 98%  98%   Weight:   107 kg   Height:        Intake/Output Summary (Last 24 hours) at 12/28/2020 1243 Last data filed at 12/28/2020 1200 Gross per 24 hour  Intake --  Output 3075 ml  Net -3075 ml   Last 3 Weights 12/28/2020 12/27/2020 12/20/2020  Weight (lbs) 236 lb 238 lb 234 lb 12.8 oz  Weight (kg) 107.049 kg 107.956 kg 106.505 kg      Telemetry    Sinus rhythm at rate of 60s or 70s, right bundle branch block- Personally Reviewed  ECG    No new tracing  Physical Exam   GEN: No acute distress.   Neck: No JVD Cardiac: RRR, no murmurs, rubs, or gallops.  Respiratory: Clear to auscultation bilaterally. GI: Soft, nontender, non-distended  MS: No edema; No deformity. Neuro:  Nonfocal  Psych: Normal affect   Labs    High Sensitivity Troponin:  No results for input(s): TROPONINIHS in the last 720 hours.    Chemistry Recent Labs  Lab 12/26/20 0922 12/27/20 0401 12/28/20 0736  NA 141 142 141  K 4.3 3.5 3.8  CL 109 107 104  CO2 24 25 25   GLUCOSE 250* 166* 176*  BUN 26* 23 25*  CREATININE 1.51* 1.56* 1.39*   CALCIUM 8.7* 8.8* 8.7*  GFRNONAA 49* 47* 55*  ANIONGAP 8 10 12      Hematology Recent Labs  Lab 12/26/20 0922 12/27/20 0401  WBC 6.8 4.9  RBC 5.23 4.93  HGB 15.2 13.7  HCT 45.6 44.3  MCV 87.2 89.9  MCH 29.1 27.8  MCHC 33.3 30.9  RDW 14.0 14.2  PLT 103* 97*    BNP Recent Labs  Lab 12/26/20 1436  BNP 412.0*    Radiology    ECHOCARDIOGRAM COMPLETE  Result Date: 12/27/2020    ECHOCARDIOGRAM REPORT   Patient Name:   Matthew Schmitt Date of Exam: 12/27/2020 Medical Rec #:  [id number]      Height:       71.0 in Accession #:    [id number]     Weight:       234.8 lb Date of Birth:  [birth year]     BSA:          2.257 m Patient Age:    70 years       BP:           118/80 mmHg Patient Gender: M  HR:           64 bpm. Exam Location:  Inpatient Procedure: 2D Echo, Color Doppler and Cardiac Doppler Indications:    I48.92* Unspecified atrial flutter  History:        Patient has prior history of Echocardiogram examinations, most                 recent 12/27/2020. CAD, Arrythmias:RBBB; Risk                 Factors:Hypertension, Diabetes and Dyslipidemia.  Sonographer:    Raquel Sarna Senior RDCS Referring Phys: [id number] [place]  1. Severe global reduction in LV systolic function; probable fusion of right and left coronary cusps resulting in functionally bicuspid aortic valve; moderate AS (mean gradient 12 mmHg; AVA1 cm2); mild AI.  2. Left ventricular ejection fraction, by estimation, is 20 to 25%. The left ventricle has severely decreased function. The left ventricle demonstrates global hypokinesis. The left ventricular internal cavity size was moderately dilated. There is mild left ventricular hypertrophy. Left ventricular diastolic parameters are consistent with Grade III diastolic dysfunction (restrictive). Elevated left atrial pressure.  3. Right ventricular systolic function is normal. The right ventricular size is normal.  4. Left atrial size was severely dilated.  5. The  mitral valve is normal in structure. Mild mitral valve regurgitation. No evidence of mitral stenosis.  6. The aortic valve is bicuspid. Aortic valve regurgitation is mild. Moderate aortic valve stenosis.  7. Aortic dilatation noted. There is mild dilatation of the aortic root, measuring 40 mm. There is mild dilatation of the ascending aorta, measuring 41 mm.  8. The inferior vena cava is dilated in size with <50% respiratory variability, suggesting right atrial pressure of 15 mmHg. FINDINGS  Left Ventricle: Left ventricular ejection fraction, by estimation, is 20 to 25%. The left ventricle has severely decreased function. The left ventricle demonstrates global hypokinesis. The left ventricular internal cavity size was moderately dilated. There is mild left ventricular hypertrophy. Left ventricular diastolic parameters are consistent with Grade III diastolic dysfunction (restrictive). Elevated left atrial pressure. Right Ventricle: The right ventricular size is normal. Right ventricular systolic function is normal. Left Atrium: Left atrial size was severely dilated. Right Atrium: Right atrial size was normal in size. Pericardium: Trivial pericardial effusion is present. Mitral Valve: The mitral valve is normal in structure. Mild mitral annular calcification. Mild mitral valve regurgitation. No evidence of mitral valve stenosis. Tricuspid Valve: The tricuspid valve is normal in structure. Tricuspid valve regurgitation is trivial. No evidence of tricuspid stenosis. Aortic Valve: The aortic valve is bicuspid. Aortic valve regurgitation is mild. Aortic regurgitation PHT measures 535 msec. Moderate aortic stenosis is present. Aortic valve mean gradient measures 12.0 mmHg. Aortic valve peak gradient measures 19.7 mmHg.  Aortic valve area, by VTI measures 1.01 cm. Pulmonic Valve: The pulmonic valve was normal in structure. Pulmonic valve regurgitation is trivial. No evidence of pulmonic stenosis. Aorta: Aortic dilatation  noted. There is mild dilatation of the aortic root, measuring 40 mm. There is mild dilatation of the ascending aorta, measuring 41 mm. Venous: The inferior vena cava is dilated in size with less than 50% respiratory variability, suggesting right atrial pressure of 15 mmHg. IAS/Shunts: No atrial level shunt detected by color flow Doppler. Additional Comments: Severe global reduction in LV systolic function; probable fusion of right and left coronary cusps resulting in functionally bicuspid aortic valve; moderate AS (mean gradient 12 mmHg; AVA1 cm2); mild AI.  LEFT VENTRICLE PLAX 2D LVIDd:  6.00 cm      Diastology LVIDs:         4.40 cm      LV e' medial:    4.03 cm/s LV PW:         1.10 cm      LV E/e' medial:  23.5 LV IVS:        1.20 cm      LV e' lateral:   4.57 cm/s LVOT diam:     1.90 cm      LV E/e' lateral: 20.7 LV SV:         45 LV SV Index:   20 LVOT Area:     2.84 cm  LV Volumes (MOD) LV vol d, MOD A2C: 164.0 ml LV vol d, MOD A4C: 185.0 ml LV vol s, MOD A2C: 116.0 ml LV vol s, MOD A4C: 151.0 ml LV SV MOD A2C:     48.0 ml LV SV MOD A4C:     185.0 ml LV SV MOD BP:      42.1 ml RIGHT VENTRICLE RV S prime:     7.62 cm/s TAPSE (M-mode): 1.5 cm LEFT ATRIUM              Index       RIGHT ATRIUM           Index LA diam:        5.10 cm  2.26 cm/m  RA Area:     17.80 cm LA Vol (A2C):   128.0 ml 56.70 ml/m RA Volume:   45.10 ml  19.98 ml/m LA Vol (A4C):   93.0 ml  41.20 ml/m LA Biplane Vol: 113.0 ml 50.06 ml/m  AORTIC VALVE AV Area (Vmax):    1.10 cm AV Area (Vmean):   1.12 cm AV Area (VTI):     1.01 cm AV Vmax:           222.00 cm/s AV Vmean:          165.000 cm/s AV VTI:            0.447 m AV Peak Grad:      19.7 mmHg AV Mean Grad:      12.0 mmHg LVOT Vmax:         86.20 cm/s LVOT Vmean:        65.100 cm/s LVOT VTI:          0.160 m LVOT/AV VTI ratio: 0.36 AI PHT:            535 msec  AORTA Ao Root diam: 4.00 cm Ao Asc diam:  4.10 cm MITRAL VALVE MV Area (PHT): 3.36 cm    SHUNTS MV Decel Time: 226  msec    Systemic VTI:  0.16 m MV E velocity: 94.70 cm/s  Systemic Diam: 1.90 cm MV A velocity: 45.40 cm/s MV E/A ratio:  2.09 Kirk Ruths MD Electronically signed by Kirk Ruths MD Signature Date/Time: 12/27/2020/1:47:58 PM    Final    ECHO TEE  Result Date: 12/27/2020    TRANSESOPHOGEAL ECHO REPORT   Patient Name:   Matthew Schmitt Date of Exam: 12/27/2020 Medical Rec #:  [id number]      Height:       71.0 in Accession #:    [id number]     Weight:       234.8 lb Date of Birth:  05-Sep-1951     BSA:          2.257 m Patient Age:  69 years       BP:           112/87 mmHg Patient Gender: M              HR:           96 bpm. Exam Location:  Inpatient Procedure: Transesophageal Echo, 3D Echo, Color Doppler and Cardiac Doppler Indications:     I48.92* Unspecified atrial flutter  History:         Patient has prior history of Echocardiogram examinations, most                  recent 04/02/2013. CAD, Arrythmias:RBBB; Risk                  Factors:Hypertension, Diabetes and Dyslipidemia.  Sonographer:     Raquel Sarna Senior RDCS Referring Phys:  [id number] Leanor Kail Diagnosing Phys: Skeet Latch MD PROCEDURE: After discussion of the risks and benefits of a TEE, an informed consent was obtained from the patient. The transesophogeal probe was passed without difficulty through the esophogus of the patient. Imaged were obtained with the patient in a left lateral decubitus position. Local oropharyngeal anesthetic was provided with Cetacaine. Sedation performed by different physician. The patient was monitored while under deep sedation. Anesthestetic sedation was provided intravenously by Anesthesiology: 287mg  of Propofol. Image quality was excellent. The patient's vital signs; including heart rate, blood pressure, and oxygen saturation; remained stable throughout the procedure. The patient developed no complications during the procedure.  A successful direct current cardioversion was performed at 200 joules with 1  attempt. IMPRESSIONS  1. Left ventricular ejection fraction, by estimation, is <20%. The left ventricle has severely decreased function. The left ventricle demonstrates global hypokinesis. The left ventricular internal cavity size was mildly to moderately dilated.  2. Right ventricular systolic function is moderately reduced. The right ventricular size is normal.  3. No left atrial/left atrial appendage thrombus was detected. The LAA emptying velocity was 38 cm/s.  4. Multiple mild regurgitant jets that collectively cause moderate mitral regurgitation. The mitral valve is normal in structure. Moderate mitral valve regurgitation. No evidence of mitral stenosis.  5. There is fusion and calcification of the left and right coronary cusps. The aortic valve is bicuspid. Aortic valve regurgitation is not visualized. No aortic stenosis is present.  6. Aortic dilatation noted. There is mild dilatation of the ascending aorta, measuring 41 mm. Conclusion(s)/Recommendation(s): Normal biventricular function without evidence of hemodynamically significant valvular heart disease. FINDINGS  Left Ventricle: Left ventricular ejection fraction, by estimation, is <20%. The left ventricle has severely decreased function. The left ventricle demonstrates global hypokinesis. The left ventricular internal cavity size was mildly to moderately dilated. There is no left ventricular hypertrophy. Right Ventricle: The right ventricular size is normal. No increase in right ventricular wall thickness. Right ventricular systolic function is moderately reduced. Left Atrium: Left atrial size was normal in size. No left atrial/left atrial appendage thrombus was detected. The LAA emptying velocity was 38 cm/s. Right Atrium: Right atrial size was normal in size. Pericardium: There is no evidence of pericardial effusion. Mitral Valve: Multiple mild regurgitant jets that collectively cause moderate mitral regurgitation. The mitral valve is normal in  structure. Moderate mitral valve regurgitation. No evidence of mitral valve stenosis. Tricuspid Valve: The tricuspid valve is normal in structure. Tricuspid valve regurgitation is mild . No evidence of tricuspid stenosis. Aortic Valve: There is fusion and calcification of the left and right coronary cusps. The aortic valve is bicuspid. Aortic  valve regurgitation is not visualized. No aortic stenosis is present. Pulmonic Valve: The pulmonic valve was normal in structure. Pulmonic valve regurgitation is mild. No evidence of pulmonic stenosis. Aorta: Aortic dilatation noted, the aortic arch was not well visualized and the aortic root is normal in size and structure. There is mild dilatation of the ascending aorta, measuring 41 mm. There is minimal (Grade I) layered plaque involving the descending aorta. IAS/Shunts: No atrial level shunt detected by color flow Doppler.  MR Peak grad:    86.9 mmHg MR Mean grad:    52.0 mmHg MR Vmax:         466.00 cm/s MR Vmean:        333.0 cm/s MR PISA:         3.08 cm MR PISA Eff ROA: 25 mm MR PISA Radius:  0.70 cm Skeet Latch MD Electronically signed by Skeet Latch MD Signature Date/Time: 12/27/2020/12:28:05 PM    Final     Cardiac Studies   70 y.o. male with medically managed CAD, paroxysmal atrial flutter, hypertension, uncontrolled diabetes mellitus, hyperlipidemia, chronic systolic CHF, NICM 2nd to tachycardiaand bifascicular block/right bundle branch block presented with worsening SOB, orthopnea, PND and LE edema who found to have afib RVR.  Hx of atrial flutter with prior cardioversion.   Patient Profile   TTE 1. Severe global reduction in LV systolic function; probable fusion of  right and left coronary cusps resulting in functionally bicuspid aortic  valve; moderate AS (mean gradient 12 mmHg; AVA1 cm2); mild AI.  2. Left ventricular ejection fraction, by estimation, is 20 to 25%. The  left ventricle has severely decreased function. The left  ventricle  demonstrates global hypokinesis. The left ventricular internal cavity size  was moderately dilated. There is mild  left ventricular hypertrophy. Left ventricular diastolic parameters are  consistent with Grade III diastolic dysfunction (restrictive). Elevated  left atrial pressure.  3. Right ventricular systolic function is normal. The right ventricular  size is normal.  4. Left atrial size was severely dilated.  5. The mitral valve is normal in structure. Mild mitral valve  regurgitation. No evidence of mitral stenosis.  6. The aortic valve is bicuspid. Aortic valve regurgitation is mild.  Moderate aortic valve stenosis.  7. Aortic dilatation noted. There is mild dilatation of the aortic root,  measuring 40 mm. There is mild dilatation of the ascending aorta,  measuring 41 mm.  8. The inferior vena cava is dilated in size with <50% respiratory  variability, suggesting right atrial pressure of 15 mmHg.   TEE 1. Left ventricular ejection fraction, by estimation, is <20%. The left  ventricle has severely decreased function. The left ventricle demonstrates  global hypokinesis. The left ventricular internal cavity size was mildly  to moderately dilated.  2. Right ventricular systolic function is moderately reduced. The right  ventricular size is normal.  3. No left atrial/left atrial appendage thrombus was detected. The LAA  emptying velocity was 38 cm/s.  4. Multiple mild regurgitant jets that collectively cause moderate mitral  regurgitation. The mitral valve is normal in structure. Moderate mitral  valve regurgitation. No evidence of mitral stenosis.  5. There is fusion and calcification of the left and right coronary  cusps. The aortic valve is bicuspid. Aortic valve regurgitation is not  visualized. No aortic stenosis is present.  6. Aortic dilatation noted. There is mild dilatation of the ascending  aorta, measuring 41 mm.   Assessment & Plan     1. Atrial Flutter with  RVR - Started IV amiodarone with load and infusion. Resumed home metoprolol 50mg  BID. Watch for bradycardia given RBBB & LAFB at baseline. No brady arrhthymias on telemetry.  - Started Eliquis>>Watch for worsening hematuria.  - TSH normal  -Underwent successful TEE cardioversion.  Maintaining sinus rhythm. -Severely dilated left atrium. -Currently on IV amiodarone>> will review PO taper dose with MD  2.  Acute combined CHF  -Prior hx of tachy mediated cardiomyopathy - BNP 412 -Echocardiogram showed LV function of 20 to 78%, grade 3 diastolic dysfunction, elevated left atrial pressure.  - On metoprolol 50mg  BID>> consolidate to Toprol XL - Held home Valsartan/HCTZ>> Likely start Entresto  - On IV lasix 40mg  BID. He looks euvolemic. PO lasix per MD.  - Net I & O negative 5.2L  3. AKI - Scr was normal on 1/31  -SCr 1.51>>1.56>>1.39  4. CAD - Moderate LAD stenosis by cath in 2012 - Intermittent chest pain  - Rate related ST depression on EKG - Hold ASA while on anticoagulation given intermittent hematuria  5. DM - SSI while here  6. HTN - Hed Valsartan/HCTZ and home amlodipine - Continue BB - BP stable   7. HLD - Increase home Crestor to 20mg  qd from 10mg  qd  -12/19/2020: Cholesterol, Total 149; HDL 42; LDL Chol Calc (NIH) 79; Triglycerides 165  8. OSA - Did not tolerated CPAP in past - Not interested in repeat study  Patient will be followed by Dr. Marlou Porch going forward.    For questions or updates, please contact [place] Please consult [email or web address] for contact info under        Signed, Leanor Kail, PA  12/28/2020, 12:43 PM    Personally seen and examined. Agree with above.  70 year old with acute systolic heart failure EF less than 20% hopefully tachycardia mediated cardiomyopathy with cardioversion on 12/27/2020 assisted by IV amiodarone.  GEN: Well nourished, well developed, in no acute distress  HEENT: normal   Neck: no JVD, carotid bruits, or masses Cardiac: RRR; no murmurs, rubs, or gallops, improved lower extremity edema, no longer weeping legs are softer Respiratory:  clear to auscultation bilaterally, normal work of breathing GI: soft, nontender, nondistended, + BS MS: no deformity or atrophy  Skin: warm and dry, no rash Neuro:  Alert and Oriented x 3, Strength and sensation are intact Psych: euthymic mood, full affect  Telemetry shows right bundle branch block sinus rhythm first-degree AV block heart rate 60-70  Assessment and plan:  Rapid atrial flutter -Post cardioversion 2/8 -IV amiodarone -Convert to p.o. likely tomorrow 200 twice daily  Acute systolic heart failure -EF less than 20.  Hopefully tachycardia mediated. -Toprol 50 XL transition tomorrow -Entresto low-dose starting today -Continue with IV Lasix 40 twice daily today.  He is out approximately 5 L.  Chronic anticoagulation -Eliquis.  Watch for signs of bleeding  Hopeful DC tomorrow  Candee Furbish, MD

## 2020-12-28 NOTE — Care Management (Addendum)
12-28-20 1137 Benefits check submitted for Eliquis and Case Manager will follow for cost. Graves-Bigelow, Ocie Cornfield, RN,BSN

## 2020-12-28 NOTE — TOC Benefit Eligibility Note (Signed)
Transition of Care [facility]) Benefit Eligibility Note    Patient Details  Name: Matthew Schmitt MRN: [id number] Date of Birth: 02-Mar-1951   Medication/Dose: Arne Cleveland  5 MG BID  Covered?: Yes     Prescription Coverage [facility]  Spoke with Person/Company/Phone Number:: ANNA  @ [facility] # [phone]  Co-Pay: $30.00  Prior Approval: No  Deductible: Met       Memory Argue Phone Number: 12/28/2020, 2:10 PM

## 2020-12-28 NOTE — Progress Notes (Addendum)
Heart Failure Stewardship Pharmacist Progress Note   PCP: Hamrick, Lorin Mercy, MD PCP-Cardiologist: Shirlee More, MD    HPI:  70 yo M with PMH of CAD, aflutter, HTN, diabetes, HLD, HFrEF, and bifascicular block/right bundle branch block. He presented to the ED on 12/26/20 with worsening shortness of breath, orthopnea, PND, LE edema, and found to be in afib RVR. An ECHO was done on 12/27/20 and LVEF is severely reduced to 20-25%. S/p successful TEE/DCCV on 12/27/20.  Current HF Medications: Furosemide 40 mg IV BID Metoprolol XL 50 mg daily Entresto 24/26 mg BID  Prior to admission HF Medications: Metoprolol tartrate 50 mg BID Valsartan 320 mg daily  Pertinent Lab Values: . Serum creatinine 1.39, BUN 25, Potassium 3.8, Sodium 141, BNP 412   Vital Signs: . Weight: 236 lbs (admission weight: 238 lbs) . Blood pressure: 120-140/70-90s  . Heart rate: 70s   Medication Assistance / Insurance Benefits Check: Does the patient have prescription insurance?  Yes Type of insurance plan: [employer]  Outpatient Pharmacy:  [facility] to admission [facility] Is the patient willing to use [facility] at discharge? Yes Is the patient willing to transition their outpatient pharmacy to utilize a [facility] outpatient pharmacy?   Pending    Assessment: 1. Acute on chronic systolic CHF (EF 33-43%), likely due to tachy-mediated cardiomyopathy. NYHA class II symptoms. - Continue furosemide 40 mg IV BID  - Agree with transitioning to metoprolol XL 50 mg daily - Agree with starting Entresto 24/26 mg BID - Consider starting spironolactone 12.5 mg daily prior to discharge - Consider starting Farxiga 10 mg daily prior to discharge   Plan: 1) Medication changes recommended at this time: - Agree with changes noted above - Consider starting spironolactone +/- Farxiga prior to discharge  2) Patient assistance: - Entresto copay: $30/month - will enroll him in month copay  card to bring this down to $10 per 30/60/90 day supply - Farxiga/Jardiance requires prior authorization - I can complete this if initiated prior to discharge  3)  Education  - To be completed prior to discharge  Kerby Nora, PharmD, BCPS Heart Failure Stewardship Pharmacist Phone [phone]

## 2020-12-28 NOTE — Progress Notes (Signed)
Heart Failure Stewardship Pharmacist Progress Note  Was successful in obtaining a copay card for [facility]  This copay card will make the patients copay $10 per 30-, 60-, or 90-day supply.  The billing information is as follows and has been uploaded to [place] system.  RxBin: [id number] PCN: [id number] Member ID: [id number] Group ID: [id number]  Kerby Nora, PharmD, BCPS Heart Failure Stewardship Pharmacist Phone [phone]  Please check [email or web address] for unit-specific pharmacist phone numbers

## 2020-12-28 NOTE — Telephone Encounter (Signed)
    Pt is scheduled with Vin for TOC on 01/05/21 at 11:15 am. scheduled by Vin.

## 2020-12-29 ENCOUNTER — Other Ambulatory Visit: Payer: Self-pay | Admitting: Student

## 2020-12-29 DIAGNOSIS — D696 Thrombocytopenia, unspecified: Secondary | ICD-10-CM

## 2020-12-29 DIAGNOSIS — E876 Hypokalemia: Secondary | ICD-10-CM

## 2020-12-29 DIAGNOSIS — R319 Hematuria, unspecified: Secondary | ICD-10-CM

## 2020-12-29 DIAGNOSIS — N179 Acute kidney failure, unspecified: Secondary | ICD-10-CM

## 2020-12-29 DIAGNOSIS — G4733 Obstructive sleep apnea (adult) (pediatric): Secondary | ICD-10-CM

## 2020-12-29 DIAGNOSIS — I5043 Acute on chronic combined systolic (congestive) and diastolic (congestive) heart failure: Secondary | ICD-10-CM | POA: Insufficient documentation

## 2020-12-29 LAB — CBC
HCT: 41 % (ref 39.0–52.0)
Hemoglobin: 14 g/dL (ref 13.0–17.0)
MCH: 29.5 pg (ref 26.0–34.0)
MCHC: 34.1 g/dL (ref 30.0–36.0)
MCV: 86.3 fL (ref 80.0–100.0)
Platelets: 98 10*3/uL — ABNORMAL LOW (ref 150–400)
RBC: 4.75 MIL/uL (ref 4.22–5.81)
RDW: 13.7 % (ref 11.5–15.5)
WBC: 4.2 10*3/uL (ref 4.0–10.5)
nRBC: 0 % (ref 0.0–0.2)

## 2020-12-29 LAB — BASIC METABOLIC PANEL
Anion gap: 10 (ref 5–15)
BUN: 23 mg/dL (ref 8–23)
CO2: 28 mmol/L (ref 22–32)
Calcium: 8.5 mg/dL — ABNORMAL LOW (ref 8.9–10.3)
Chloride: 104 mmol/L (ref 98–111)
Creatinine, Ser: 1.47 mg/dL — ABNORMAL HIGH (ref 0.61–1.24)
GFR, Estimated: 51 mL/min — ABNORMAL LOW (ref >60)
Glucose, Bld: 262 mg/dL — ABNORMAL HIGH (ref 70–99)
Potassium: 3.4 mmol/L — ABNORMAL LOW (ref 3.5–5.1)
Sodium: 142 mmol/L (ref 135–145)

## 2020-12-29 LAB — GLUCOSE, CAPILLARY
Glucose-Capillary: 151 mg/dL — ABNORMAL HIGH (ref 70–99)
Glucose-Capillary: 176 mg/dL — ABNORMAL HIGH (ref 70–99)

## 2020-12-29 MED ORDER — ENTRESTO 49-51 MG PO TABS: 1.0000 | ORAL_TABLET | Freq: Two times a day (BID) | ORAL | 3 refills | Status: DC

## 2020-12-29 MED ORDER — DAPAGLIFLOZIN PROPANEDIOL 10 MG PO TABS: 10.0000 mg | ORAL_TABLET | Freq: Every day | ORAL | 0 refills | Status: DC

## 2020-12-29 MED ORDER — SPIRONOLACTONE 25 MG PO TABS: 12.5000 mg | ORAL_TABLET | Freq: Every day | ORAL | 0 refills | Status: DC

## 2020-12-29 MED ORDER — METOPROLOL SUCCINATE ER 50 MG PO TB24: 50.0000 mg | ORAL_TABLET | Freq: Every day | ORAL | 3 refills | Status: DC

## 2020-12-29 MED ORDER — FUROSEMIDE 40 MG PO TABS: 40.0000 mg | ORAL_TABLET | Freq: Every day | ORAL | 1 refills | Status: DC | PRN

## 2020-12-29 MED ORDER — AMIODARONE HCL 200 MG PO TABS: ORAL_TABLET | ORAL | 0 refills | Status: DC

## 2020-12-29 MED ORDER — ENTRESTO 49-51 MG PO TABS: 1.0000 | ORAL_TABLET | Freq: Two times a day (BID) | ORAL | 0 refills | Status: DC

## 2020-12-29 MED ORDER — SPIRONOLACTONE 25 MG PO TABS: 12.5000 mg | ORAL_TABLET | Freq: Every day | ORAL | 3 refills | Status: DC

## 2020-12-29 MED ORDER — DAPAGLIFLOZIN PROPANEDIOL 10 MG PO TABS: 10.0000 mg | ORAL_TABLET | Freq: Every day | ORAL | 3 refills | Status: DC

## 2020-12-29 MED ORDER — ROSUVASTATIN CALCIUM 20 MG PO TABS: 20.0000 mg | ORAL_TABLET | Freq: Every day | ORAL | 3 refills | Status: DC

## 2020-12-29 MED ORDER — AMIODARONE HCL 200 MG PO TABS: 200.0000 mg | ORAL_TABLET | Freq: Every day | ORAL | 3 refills | Status: DC

## 2020-12-29 MED ORDER — POTASSIUM CHLORIDE CRYS ER 20 MEQ PO TBCR
40.0000 meq | EXTENDED_RELEASE_TABLET | Freq: Once | ORAL | Status: AC
  Administered 2020-12-29: 40 meq via ORAL
  Filled 2020-12-29: qty 2

## 2020-12-29 MED ORDER — APIXABAN 5 MG PO TABS
5.0000 mg | ORAL_TABLET | Freq: Two times a day (BID) | ORAL | 0 refills | Status: DC
Start: 2020-12-29 — End: 2021-08-18

## 2020-12-29 MED ORDER — APIXABAN 5 MG PO TABS: 5.0000 mg | ORAL_TABLET | Freq: Two times a day (BID) | ORAL | 3 refills | Status: DC

## 2020-12-29 MED ORDER — FUROSEMIDE 40 MG PO TABS: 40.0000 mg | ORAL_TABLET | Freq: Every day | ORAL | 0 refills | Status: DC | PRN

## 2020-12-29 MED ORDER — METOPROLOL SUCCINATE ER 50 MG PO TB24: 50.0000 mg | ORAL_TABLET | Freq: Every day | ORAL | 0 refills | Status: DC

## 2020-12-29 MED FILL — ENTRESTO 49 MG-51 MG TABLET: 49-51 | 30 days supply | Qty: 60 | Fill #0

## 2020-12-29 MED FILL — FUROSEMIDE 40 MG TABLET: 40 | 30 days supply | Qty: 30 | Fill #0

## 2020-12-29 MED FILL — SPIRONOLACTONE 25 MG TABLET: 25 | 30 days supply | Qty: 15 | Fill #0

## 2020-12-29 MED FILL — FARXIGA 10 MG TABLET: 10 | 30 days supply | Qty: 30 | Fill #0

## 2020-12-29 MED FILL — ELIQUIS 5 MG TABLET: 5 | 30 days supply | Qty: 60 | Fill #0

## 2020-12-29 MED FILL — AMIODARONE HCL 200 MG TAB: 200 | 30 days supply | Qty: 45 | Fill #0

## 2020-12-29 MED FILL — METOPROLOL SUCCINATE ER 50: 50 | 30 days supply | Qty: 30 | Fill #0

## 2020-12-29 NOTE — Progress Notes (Addendum)
Progress Note  Patient Name: Matthew Schmitt Date of Encounter: 12/29/2020  [facility] Cardiologist: Candee Furbish, MD   Subjective   No acute overnight events. Patient is doing well this morning an is very eager to go home. His shortness of breath has improved and he feels like is breathing is very close to baseline. He was able to lay flat at times last night. No recurrent chest pain. Maintaining sinus rhythm.  Inpatient Medications    Scheduled Meds: . amiodarone  200 mg Oral BID  . apixaban  5 mg Oral BID  . furosemide  40 mg Intravenous BID  . insulin aspart  0-15 Units Subcutaneous TID WC  . metoprolol succinate  50 mg Oral Daily  . rosuvastatin  20 mg Oral Daily  . sacubitril-valsartan  1 tablet Oral BID   Continuous Infusions:  PRN Meds: acetaminophen, nitroGLYCERIN, ondansetron (ZOFRAN) IV   Vital Signs    Vitals:   12/28/20 1307 12/28/20 1935 12/28/20 2149 12/29/20 0615  BP: 125/70 (!) 154/85 135/76 (!) 156/88  Pulse: 72 74  78  Resp: 17 16  19   Temp: 97.8 F (36.6 C) 97.8 F (36.6 C)  97.8 F (36.6 C)  TempSrc: Oral Oral  Oral  SpO2:  99%  99%  Weight:    102.3 kg  Height:        Intake/Output Summary (Last 24 hours) at 12/29/2020 0742 Last data filed at 12/28/2020 2149 Gross per 24 hour  Intake 1400 ml  Output 3550 ml  Net -2150 ml   Last 3 Weights 12/29/2020 12/28/2020 12/27/2020  Weight (lbs) 225 lb 9.6 oz 236 lb 238 lb  Weight (kg) 102.331 kg 107.049 kg 107.956 kg      Telemetry    Normal sinus rhythm with rates in the 60's to 70's. Occasional PVC. - Personally Reviewed  ECG    No new ECG tracing today. - Personally Reviewed  Physical Exam   GEN: No acute distress.   Neck: Supple. No JVD Cardiac: RRR. II-III/VI systolic murmur heard best at right upper sternal border. No rubs or gallops. Radial pulses 2+ and equal bilaterally. Right pedal pulse 2+ and left pedal pulse 1+.  Respiratory: No increased work of breathing. Slight crackles in  bases that cleared with coughing. No wheezes or rhonchi. GI: Soft, obese, and non-tender. MS: No lower extremity edema. Hyperpigmentation of anterior lower legs bilaterally. Skin: Warm and dry. Neuro:  No focal deficits. Psych: Normal affect. Responds appropriately.  Labs    High Sensitivity Troponin:  No results for input(s): TROPONINIHS in the last 720 hours.    Chemistry Recent Labs  Lab 12/27/20 0401 12/28/20 0736 12/29/20 0154  NA 142 141 142  K 3.5 3.8 3.4*  CL 107 104 104  CO2 25 25 28   GLUCOSE 166* 176* 262*  BUN 23 25* 23  CREATININE 1.56* 1.39* 1.47*  CALCIUM 8.8* 8.7* 8.5*  GFRNONAA 47* 55* 51*  ANIONGAP 10 12 10      Hematology Recent Labs  Lab 12/26/20 0922 12/27/20 0401 12/29/20 0154  WBC 6.8 4.9 4.2  RBC 5.23 4.93 4.75  HGB 15.2 13.7 14.0  HCT 45.6 44.3 41.0  MCV 87.2 89.9 86.3  MCH 29.1 27.8 29.5  MCHC 33.3 30.9 34.1  RDW 14.0 14.2 13.7  PLT 103* 97* 98*    BNP Recent Labs  Lab 12/26/20 1436  BNP 412.0*     DDimer No results for input(s): DDIMER in the last 168 hours.   Radiology  ECHOCARDIOGRAM COMPLETE  Result Date: 12/27/2020    ECHOCARDIOGRAM REPORT   Patient Name:   Matthew Schmitt Date of Exam: 12/27/2020 Medical Rec #:  [id number]      Height:       71.0 in Accession #:    [id number]     Weight:       234.8 lb Date of Birth:  20-Aug-1951     BSA:          2.257 m Patient Age:    70 years       BP:           118/80 mmHg Patient Gender: M              HR:           64 bpm. Exam Location:  Inpatient Procedure: 2D Echo, Color Doppler and Cardiac Doppler Indications:    I48.92* Unspecified atrial flutter  History:        Patient has prior history of Echocardiogram examinations, most                 recent 12/27/2020. CAD, Arrythmias:RBBB; Risk                 Factors:Hypertension, Diabetes and Dyslipidemia.  Sonographer:    Raquel Sarna Senior RDCS Referring Phys: [id number] [place]  1. Severe global reduction in LV systolic  function; probable fusion of right and left coronary cusps resulting in functionally bicuspid aortic valve; moderate AS (mean gradient 12 mmHg; AVA1 cm2); mild AI.  2. Left ventricular ejection fraction, by estimation, is 20 to 25%. The left ventricle has severely decreased function. The left ventricle demonstrates global hypokinesis. The left ventricular internal cavity size was moderately dilated. There is mild left ventricular hypertrophy. Left ventricular diastolic parameters are consistent with Grade III diastolic dysfunction (restrictive). Elevated left atrial pressure.  3. Right ventricular systolic function is normal. The right ventricular size is normal.  4. Left atrial size was severely dilated.  5. The mitral valve is normal in structure. Mild mitral valve regurgitation. No evidence of mitral stenosis.  6. The aortic valve is bicuspid. Aortic valve regurgitation is mild. Moderate aortic valve stenosis.  7. Aortic dilatation noted. There is mild dilatation of the aortic root, measuring 40 mm. There is mild dilatation of the ascending aorta, measuring 41 mm.  8. The inferior vena cava is dilated in size with <50% respiratory variability, suggesting right atrial pressure of 15 mmHg. FINDINGS  Left Ventricle: Left ventricular ejection fraction, by estimation, is 20 to 25%. The left ventricle has severely decreased function. The left ventricle demonstrates global hypokinesis. The left ventricular internal cavity size was moderately dilated. There is mild left ventricular hypertrophy. Left ventricular diastolic parameters are consistent with Grade III diastolic dysfunction (restrictive). Elevated left atrial pressure. Right Ventricle: The right ventricular size is normal. Right ventricular systolic function is normal. Left Atrium: Left atrial size was severely dilated. Right Atrium: Right atrial size was normal in size. Pericardium: Trivial pericardial effusion is present. Mitral Valve: The mitral valve is  normal in structure. Mild mitral annular calcification. Mild mitral valve regurgitation. No evidence of mitral valve stenosis. Tricuspid Valve: The tricuspid valve is normal in structure. Tricuspid valve regurgitation is trivial. No evidence of tricuspid stenosis. Aortic Valve: The aortic valve is bicuspid. Aortic valve regurgitation is mild. Aortic regurgitation PHT measures 535 msec. Moderate aortic stenosis is present. Aortic valve mean gradient measures 12.0 mmHg. Aortic valve peak gradient measures 19.7 mmHg.  Aortic valve area, by VTI measures 1.01 cm. Pulmonic Valve: The pulmonic valve was normal in structure. Pulmonic valve regurgitation is trivial. No evidence of pulmonic stenosis. Aorta: Aortic dilatation noted. There is mild dilatation of the aortic root, measuring 40 mm. There is mild dilatation of the ascending aorta, measuring 41 mm. Venous: The inferior vena cava is dilated in size with less than 50% respiratory variability, suggesting right atrial pressure of 15 mmHg. IAS/Shunts: No atrial level shunt detected by color flow Doppler. Additional Comments: Severe global reduction in LV systolic function; probable fusion of right and left coronary cusps resulting in functionally bicuspid aortic valve; moderate AS (mean gradient 12 mmHg; AVA1 cm2); mild AI.  LEFT VENTRICLE PLAX 2D LVIDd:         6.00 cm      Diastology LVIDs:         4.40 cm      LV e' medial:    4.03 cm/s LV PW:         1.10 cm      LV E/e' medial:  23.5 LV IVS:        1.20 cm      LV e' lateral:   4.57 cm/s LVOT diam:     1.90 cm      LV E/e' lateral: 20.7 LV SV:         45 LV SV Index:   20 LVOT Area:     2.84 cm  LV Volumes (MOD) LV vol d, MOD A2C: 164.0 ml LV vol d, MOD A4C: 185.0 ml LV vol s, MOD A2C: 116.0 ml LV vol s, MOD A4C: 151.0 ml LV SV MOD A2C:     48.0 ml LV SV MOD A4C:     185.0 ml LV SV MOD BP:      42.1 ml RIGHT VENTRICLE RV S prime:     7.62 cm/s TAPSE (M-mode): 1.5 cm LEFT ATRIUM              Index       RIGHT ATRIUM            Index LA diam:        5.10 cm  2.26 cm/m  RA Area:     17.80 cm LA Vol (A2C):   128.0 ml 56.70 ml/m RA Volume:   45.10 ml  19.98 ml/m LA Vol (A4C):   93.0 ml  41.20 ml/m LA Biplane Vol: 113.0 ml 50.06 ml/m  AORTIC VALVE AV Area (Vmax):    1.10 cm AV Area (Vmean):   1.12 cm AV Area (VTI):     1.01 cm AV Vmax:           222.00 cm/s AV Vmean:          165.000 cm/s AV VTI:            0.447 m AV Peak Grad:      19.7 mmHg AV Mean Grad:      12.0 mmHg LVOT Vmax:         86.20 cm/s LVOT Vmean:        65.100 cm/s LVOT VTI:          0.160 m LVOT/AV VTI ratio: 0.36 AI PHT:            535 msec  AORTA Ao Root diam: 4.00 cm Ao Asc diam:  4.10 cm MITRAL VALVE MV Area (PHT): 3.36 cm    SHUNTS MV Decel Time: 226 msec    Systemic  VTI:  0.16 m MV E velocity: 94.70 cm/s  Systemic Diam: 1.90 cm MV A velocity: 45.40 cm/s MV E/A ratio:  2.09 Kirk Ruths MD Electronically signed by Kirk Ruths MD Signature Date/Time: 12/27/2020/1:47:58 PM    Final    ECHO TEE  Result Date: 12/27/2020    TRANSESOPHOGEAL ECHO REPORT   Patient Name:   Matthew Schmitt Date of Exam: 12/27/2020 Medical Rec #:  [id number]      Height:       71.0 in Accession #:    [id number]     Weight:       234.8 lb Date of Birth:  06/23/1951     BSA:          2.257 m Patient Age:    43 years       BP:           112/87 mmHg Patient Gender: M              HR:           96 bpm. Exam Location:  Inpatient Procedure: Transesophageal Echo, 3D Echo, Color Doppler and Cardiac Doppler Indications:     I48.92* Unspecified atrial flutter  History:         Patient has prior history of Echocardiogram examinations, most                  recent 04/02/2013. CAD, Arrythmias:RBBB; Risk                  Factors:Hypertension, Diabetes and Dyslipidemia.  Sonographer:     Raquel Sarna Senior RDCS Referring Phys:  [id number] Leanor Kail Diagnosing Phys: Skeet Latch MD PROCEDURE: After discussion of the risks and benefits of a TEE, an informed consent was obtained from the  patient. The transesophogeal probe was passed without difficulty through the esophogus of the patient. Imaged were obtained with the patient in a left lateral decubitus position. Local oropharyngeal anesthetic was provided with Cetacaine. Sedation performed by different physician. The patient was monitored while under deep sedation. Anesthestetic sedation was provided intravenously by Anesthesiology: 287mg  of Propofol. Image quality was excellent. The patient's vital signs; including heart rate, blood pressure, and oxygen saturation; remained stable throughout the procedure. The patient developed no complications during the procedure.  A successful direct current cardioversion was performed at 200 joules with 1 attempt. IMPRESSIONS  1. Left ventricular ejection fraction, by estimation, is <20%. The left ventricle has severely decreased function. The left ventricle demonstrates global hypokinesis. The left ventricular internal cavity size was mildly to moderately dilated.  2. Right ventricular systolic function is moderately reduced. The right ventricular size is normal.  3. No left atrial/left atrial appendage thrombus was detected. The LAA emptying velocity was 38 cm/s.  4. Multiple mild regurgitant jets that collectively cause moderate mitral regurgitation. The mitral valve is normal in structure. Moderate mitral valve regurgitation. No evidence of mitral stenosis.  5. There is fusion and calcification of the left and right coronary cusps. The aortic valve is bicuspid. Aortic valve regurgitation is not visualized. No aortic stenosis is present.  6. Aortic dilatation noted. There is mild dilatation of the ascending aorta, measuring 41 mm. Conclusion(s)/Recommendation(s): Normal biventricular function without evidence of hemodynamically significant valvular heart disease. FINDINGS  Left Ventricle: Left ventricular ejection fraction, by estimation, is <20%. The left ventricle has severely decreased function. The left  ventricle demonstrates global hypokinesis. The left ventricular internal cavity size was mildly to moderately dilated. There is no left ventricular  hypertrophy. Right Ventricle: The right ventricular size is normal. No increase in right ventricular wall thickness. Right ventricular systolic function is moderately reduced. Left Atrium: Left atrial size was normal in size. No left atrial/left atrial appendage thrombus was detected. The LAA emptying velocity was 38 cm/s. Right Atrium: Right atrial size was normal in size. Pericardium: There is no evidence of pericardial effusion. Mitral Valve: Multiple mild regurgitant jets that collectively cause moderate mitral regurgitation. The mitral valve is normal in structure. Moderate mitral valve regurgitation. No evidence of mitral valve stenosis. Tricuspid Valve: The tricuspid valve is normal in structure. Tricuspid valve regurgitation is mild . No evidence of tricuspid stenosis. Aortic Valve: There is fusion and calcification of the left and right coronary cusps. The aortic valve is bicuspid. Aortic valve regurgitation is not visualized. No aortic stenosis is present. Pulmonic Valve: The pulmonic valve was normal in structure. Pulmonic valve regurgitation is mild. No evidence of pulmonic stenosis. Aorta: Aortic dilatation noted, the aortic arch was not well visualized and the aortic root is normal in size and structure. There is mild dilatation of the ascending aorta, measuring 41 mm. There is minimal (Grade I) layered plaque involving the descending aorta. IAS/Shunts: No atrial level shunt detected by color flow Doppler.  MR Peak grad:    86.9 mmHg MR Mean grad:    52.0 mmHg MR Vmax:         466.00 cm/s MR Vmean:        333.0 cm/s MR PISA:         3.08 cm MR PISA Eff ROA: 25 mm MR PISA Radius:  0.70 cm Skeet Latch MD Electronically signed by Skeet Latch MD Signature Date/Time: 12/27/2020/12:28:05 PM    Final     Cardiac Studies   TTE  12/27/2020: Impressions: 1. Severe global reduction in LV systolic function; probable fusion of  right and left coronary cusps resulting in functionally bicuspid aortic  valve; moderate AS (mean gradient 12 mmHg; AVA1 cm2); mild AI.  2. Left ventricular ejection fraction, by estimation, is 20 to 25%. The  left ventricle has severely decreased function. The left ventricle  demonstrates global hypokinesis. The left ventricular internal cavity size was moderately dilated. There is mild left ventricular hypertrophy. Left ventricular diastolic parameters are  consistent with Grade III diastolic dysfunction (restrictive). Elevated  left atrial pressure.  3. Right ventricular systolic function is normal. The right ventricular  size is normal.  4. Left atrial size was severely dilated.  5. The mitral valve is normal in structure. Mild mitral valve  regurgitation. No evidence of mitral stenosis.  6. The aortic valve is bicuspid. Aortic valve regurgitation is mild.  Moderate aortic valve stenosis.  7. Aortic dilatation noted. There is mild dilatation of the aortic root,  measuring 40 mm. There is mild dilatation of the ascending aorta,  measuring 41 mm.  8. The inferior vena cava is dilated in size with <50% respiratory  variability, suggesting right atrial pressure of 15 mmHg.  _______________  TEE 12/27/2020: Impressions: 1. Left ventricular ejection fraction, by estimation, is <20%. The left  ventricle has severely decreased function. The left ventricle demonstrates global hypokinesis. The left ventricular internal cavity size was mildly to moderately dilated.  2. Right ventricular systolic function is moderately reduced. The right ventricular size is normal.  3. No left atrial/left atrial appendage thrombus was detected. The LAA emptying velocity was 38 cm/s.  4. Multiple mild regurgitant jets that collectively cause moderate mitral regurgitation. The mitral valve is  normal in  structure. Moderate mitral valve regurgitation. No evidence of mitral stenosis.  5. There is fusion and calcification of the left and right coronary  cusps. The aortic valve is bicuspid. Aortic valve regurgitation is not  visualized. No aortic stenosis is present.  6. Aortic dilatation noted. There is mild dilatation of the ascending  aorta, measuring 41 mm.   Conclusion(s)/Recommendation(s): Normal biventricular function without evidence of hemodynamically significant valvular heart disease.   Patient Profile     69 y.o. male with a history of non-obstructive CAD with moderate stenosis of LAD on cardiac catheterization in 2012, non-ischemic cardiomyopathy secondary to bifascicular block/RBBB with chronic combined CHF and known EF of 25-30% on Echo in 2014, paroxysmal atrial flutter, hypertension, hyperlipidemia, and uncontrolled diabetes who was admitted with acute on chronic CHF and atrial flutter with RVR after presenting with worsening shortness of breath, orthopnea, PND, and lower extremity edema.  Assessment & Plan    Atrial Flutter with RVR - Started on IV Amiodarone and the underwent successful TEE/DCCV on 2/8. Maintaining sinus rhythm.  - Echo showed LVEF of 20-25%. - TSH normal. - Potassium slightly low today at 3.4. Will replete. - Transitioned to PO Amiodarone 200mg  twice daily yesterday.  - CHA2DS2-VASc = 5 (CHF, CAD, DM, HTN, age). Continue Eliquis 5mg  twice daily.  Acute on Chronic Combined CHF Non-Ischemic Cardiomyopathy (Tachymediated)  - BNP elevated at 412. - Chest x-ray showed mild atelectasis lateral right base but lung elsewhere clear. - Echo showed LVEF of 20-25% with global hypokinesis, grade III, and moderate AS (mean gradient), mild AI, mild MR, and mild dilatation aortic root (11mm) and ascending aorta (19mm). - Currently on IV Lasix 40mg  twice daily. Documented 3.5 L of urinary output yesterday and net negative 6.4 L since admission. Weight 225lb today, down  from 236 lbs yesterday. Renal function stable. - Appears euvolemic on exam. - I think we can like switch to PO Lasix today. Consider 40mg  daily at discharge. Will discuss with MD. - Continue Entresto 24-26mg  twice daily. - Continue Toprol-XL 50mg  daily. - Considering adding Spironolactone at follow-up visit if renal function and BP allow. - Continue to monitor daily weights, strict I/O's, and renal function.  CAD - Moderate stenosis of LAD after the bifurcation of a large diagonal branch was noted on cardiac catheterization in 2012. - EKG with rate related ST depression.  - He reported some chest discomfort on admission when in RVR. None since rates have been controlled. - No Aspirin due to need for DOAC and intermittent hematuria. - Continue beta-blocker and high-sensitivity statin.  Hypertension - BP mildly elevated this morning but has not received medications yet. - Home Valsartan/HCTZ and Amlodipine stopped. - Continue Entresto 24-26mg  twice daily. - Continue Topropl-XL 25mg  daily.  Hyperlipidemia - Lipid panel from 12/19/2020: Total Cholesterol 149, Triglycerides 165, HDL 42, LDL 79.  - Home Crestor increased from 10mg  to 20mg  daily.   Type 2 Diabetes Mellitus - Continue sliding scale insulin here. - Can restart home medications at discharge.  AKI - Creatinine 1.51 on admission. Baseline around 1.0. - Creatinine stable at 1.47 today. - Continue to monitor closely.   Hypokalemia - Potassium 3.4 today. - Will replete with K-Dur 40 mEq.  - May benefit from addition of Spironolactone if renal function will tolerate. - Continue to monitor.  Obstructive Sleep Apnea - Did not tolerate CPAP in the past. - Patient not interested in repeating study.  Thrombocytopenia - Platelets 103 on admission and stable at 98 today.  -  Follow-up with PCP.  Hematuria - Patient reports intermittent mild hematuria due to kidney stones and possible prostate issues. - No worsening of this  since being started on Eliquis. - Hemoglobin normal today.   For questions or updates, please contact [place] Please consult [email or web address] for contact info under        Signed, Darreld Mclean, PA-C  12/29/2020, 7:42 AM    Personally seen and examined. Agree with above.   70 year old male with EF less than 20% in the setting of rapid atrial flutter.  Underwent cardioversion.  Successful with amiodarone.  Diuresed 6.5 liters.  Weight 225 pounds.  Feels much better, legs are no longer weeping.  Breathing better.  Plan will be to repeat echocardiogram in the next 2 to 3 months to see if resolution of poor ejection fraction occurs.  He has had this happen in the past.  We will continue with amiodarone 200 twice daily for the next 2 weeks and then 200 mg a day thereafter.  Monitor labs closely.  Watch for side effects.  He does have a right bundle branch block and first degree AV block and left anterior fascicular block as well.  Watch for signs of worsening conduction disorder especially in the setting of amiodarone.  May need pacemaker.  Delene Loll has been added, mid dose.  Spironolactone has been added as well.  We will closely watch his basic metabolic profile.  We will start off with Lasix 40 mg as needed if weight goes up greater than 3 pounds.  Of course if he starts to utilize this more and more, we may need standing Lasix.  Okay with discharge.  He works as a Freight forwarder, mostly at desk, computer, I would not be opposed to him continuing this work.  Watch for any worsening signs however.  Candee Furbish, MD

## 2020-12-29 NOTE — Discharge Summary (Addendum)
Discharge Summary    Patient ID: Matthew Schmitt MRN: [id number]; DOB: 11/20/1950  Admit date: 12/26/2020 Discharge date: 12/29/2020  Primary Care Provider: Leonides Sake, MD  Primary Cardiologist: Candee Furbish, MD  Primary Electrophysiologist:  None   Discharge Diagnoses    Principal Problem:   Atrial flutter with rapid ventricular response [facility]) Active Problems:   Nonischemic cardiomyopathy ([place])   Acute on chronic combined systolic and diastolic CHF (congestive heart failure) (HCC)   Hyperlipidemia   CAD (coronary artery disease)   Diabetes ([place])   Hypertension   AKI (acute kidney injury) ([place])   Hypokalemia   Obstructive sleep apnea   Thrombocytopenia ([place])   Hematuria    Diagnostic Studies/Procedures    TTE 12/27/2020: Impressions: 1. Severe global reduction in LV systolic function; probable fusion of  right and left coronary cusps resulting in functionally bicuspid aortic  valve; moderate AS (mean gradient 12 mmHg; AVA1 cm2); mild AI.  2. Left ventricular ejection fraction, by estimation, is 20 to 25%. The  left ventricle has severely decreased function. The left ventricle  demonstrates global hypokinesis. The left ventricular internal cavity size was moderately dilated. There is mild left ventricular hypertrophy. Left ventricular diastolic parameters are  consistent with Grade III diastolic dysfunction (restrictive). Elevated  left atrial pressure.  3. Right ventricular systolic function is normal. The right ventricular  size is normal.  4. Left atrial size was severely dilated.  5. The mitral valve is normal in structure. Mild mitral valve  regurgitation. No evidence of mitral stenosis.  6. The aortic valve is bicuspid. Aortic valve regurgitation is mild.  Moderate aortic valve stenosis.  7. Aortic dilatation noted. There is mild dilatation of the aortic root,  measuring 40 mm. There is mild dilatation of the ascending aorta,  measuring 41 mm.  8.  The inferior vena cava is dilated in size with <50% respiratory  variability, suggesting right atrial pressure of 15 mmHg.  _______________  TEE 12/27/2020: Impressions: 1. Left ventricular ejection fraction, by estimation, is <20%. The left  ventricle has severely decreased function. The left ventricle demonstrates global hypokinesis. The left ventricular internal cavity size was mildly to moderately dilated.  2. Right ventricular systolic function is moderately reduced. The right ventricular size is normal.  3. No left atrial/left atrial appendage thrombus was detected. The LAA emptying velocity was 38 cm/s.  4. Multiple mild regurgitant jets that collectively cause moderate mitral regurgitation. The mitral valve is normal in structure. Moderate mitral valve regurgitation. No evidence of mitral stenosis.  5. There is fusion and calcification of the left and right coronary  cusps. The aortic valve is bicuspid. Aortic valve regurgitation is not  visualized. No aortic stenosis is present.  6. Aortic dilatation noted. There is mild dilatation of the ascending  aorta, measuring 41 mm.   Conclusion(s)/Recommendation(s): Normal biventricular function without evidence of hemodynamically significant valvular heart disease.   History of Present Illness     Matthew Schmitt is a 70 y.o. male with a history of non-obstructive CAD with moderate stenosis of LAD on cardiac catheterization in 2012, non-ischemic cardiomyopathy secondary to bifascicular block/RBBB with chronic combined CHF and known EF of 25-30% on Echo in 2014, paroxysmal atrial flutter, hypertension, hyperlipidemia, and uncontrolled diabetes who was admitted on 12/26/2020 with acute on chronic CHF and atrial flutter with RVR after presenting with worsening shortness of breath, orthopnea, PND, and lower extremity edema.  Patient is a previous patient of Dr. Wynonia Lawman. He has a known history of  cardiomyopathy thought to be tachymediated in  nature. He was previously on Xarelto for atrial flutter but this was reportedly discontinued due to hematuria (due to kidney stones). Remote cardiac catheterization in 2012 showed moderate stenosis of LAD just after the bifurcation of a large diagonal branch. Prior Echo in 2014 showed LVEF of 25-30%. He was previously treated for sleep apnea but did not tolerate CPAP.  Since Dr. Wynonia Lawman retired, patient has been seen by Dr. Bettina Gavia. At office visit on 12/19/2020, he was noted to be in 2-1 atrial flutter with rates in the 160's. He was unaware of elevated heart rates at this time. He was started on Lopressor, Amlodipine, Valsartan-HCTZ. Dr. Joya Gaskins note also mention restarting anticoagulation and monitoring closely to see if hematuria worsens. However, it does not look like any anticoagulation was ordered.  Patient presented to the [facility] ED on 12/26/2020 with worsening shortness of breath, orthopnea, PND, and lower extremity as well as some chest discomfort and fatigue. He denied any palpitations, dizziness, or syncope. He was still in atrial flutter with rates in the 130's.  Hospital Course     Consultants: None  Atrial Flutter with RVR Patient does have a history of atrial flutter in the past and has undergone DCCV before.  At recent office visit, he was found to be back in 2-1 atrial flutter with rates in the 160's. He was started on beta-blocker at that time. On arrival to the ED, he was still in atrial flutter with rates in the 130's. TSH normal and electrolytes normal on admission. He was started on IV Amiodarone. Echo showed LVEF of 20-25%. He underwent successful TEE/DCCV on 2/8 and has maintained sinus rhythm since then with only occasional brief runs of what looks like PAT. He was transitioned to PO Amiodarone on 2/9. We will discharge patient on Amiodarone 200mg  twice daily for 2 weeks and then reduce to 200mg  daily. Continue Toprol-XL 50mg  daily at discharge as well. CHA2DS2-VASc = 5 (CHF,  CAD, DM, HTN, age). He was started on Eliquis 5mg  twice daily this admission which will be continued at discharge. Will stop Aspirin to reduce bleeding risk. Discussed importance of avoiding NSAIDs.  Of note, he does have a RBBB, LAFB, and first degree AV block. We will need to watch for signs of worsening conduction disorder especially in the setting of Amiodarone. He may need a pacemaker in the future.   Acute on Chronic Combined CHF Non-Ischemic Cardiomyopathy (Tachymediated)  BNP elevated at 412. Chest x-ray showed mild atelectasis lateral right base but lung elsewhere clear. Echo showed LVEF of 20-25% with global hypokinesis, grade III, and moderate AS (mean gradient), mild AI, mild MR, and mild dilatation aortic root (79mm) and ascending aorta (29mm). Patient was diuresed with IV Lasix with good urinary response. Net negative 6.4 L since admission. Discharge weight 225lb today, down from 236 lbs yesterday. Patient's home Valsartan/HCTZ and Lopressor were stopped on admission and he was started on Entresto and Toprol-XL instead. BP mildly elevated today and potassium slightly low so will increase Entresto and add Spironolactone. Patient will be discharged on PRN Lasix 40mg  daily for weight gain, Entresto 49-51mg  twice daily, Toprol-XL 50mg  daily, Spironolactone 12.5mg  daily, and Farxiga 10mg  daily. I had a long discussion with patient about the important of daily weight and sodium/fluid instructions. He mentioned that he normally stops by a fast food restaurant in the mornings and orders a chicken biscuit. I explained that this contains a lot of sodium and discussed the importance of  really limiting this. We have arranged close follow-up in our office next week. He will need repeat BMET at that time. Will need repeat Echo in 3 months to reassess LV function after restoration of sinus rhythm and initiation of GDMT.  Chest Pain with Known CAD History of moderate stenosis of LAD after the bifurcation of  a large diagonal branch was noted on cardiac catheterization in 2012. He did have some rate related ST depression on EKG this admission. He reported some chest discomfort on admission when in RVR but this resolved with improvement in rates. No recurrent chest pain. No ischemic evaluation was felt to be necessary. Will stop Aspirin given patient will now be on DOAC. Continue beta-blocker and high-sensitivity statin.  Hypertension BP mildly elevated at times. Home Valsartan/HCTZ and Amlodipine stopped. Started on Entresto, Toprol-XL, and Spironolactone for CHF as above.  Hyperlipidemia Lipid panel from 12/19/2020: Total Cholesterol 149, Triglycerides 165, HDL 42, LDL 79. Home Crestor increased from 10mg  to 20mg  daily this admission. Will need repeat lipid panel and LFTS in 2 months.  Type 2 Diabetes Mellitus Hemoglobin A1c 8.3 this admission. Patient was placed on sliding scale insulin while here. Will restart home medications at discharge. Will also add Farxiga 10mg  daily given CHF.  AKI Creatinine 1.51 on admission. Baseline around 1.0. Creatinine stable at 1.47 on discharge. Will need repeat BMET at follow-up visit next week.  Hypokalemia Potassium 3.4 on day of discharge. Patient was given a dose of K-Dur before discharge. We will also start Spironolactone at discharge. Can repeat BMET at follow-up visit.  Obstructive Sleep Apnea Patient did not tolerate CPAP in the past. He is not interested in repeating study.  Thrombocytopenia Platelets 103 on admission and stable at 98 on day of discharge. Follow-up with PCP.  Intermittent Hematuria Patient reports he has a history of intermittent mild hematuria due to kidney stones and possible prostate issues. This has not worsened since being started on Eliquis but will need to continue to monitor closely. Hemoglobin stable today.  Patient seen and evaluated by Dr. Marlou Porch today and determined to be stable for discharge. Close outpatient  follow-up arranged. Medications as below. I sent 30-day supply of all new medications to [facility] and then sent 90-day supply to [facility] per patient's request.   Did the patient have an acute coronary syndrome (MI, NSTEMI, STEMI, etc) this admission?:  No                               Did the patient have a percutaneous coronary intervention (stent / angioplasty)?:  No.       _____________  Discharge Vitals Blood pressure (!) 156/88, pulse 78, temperature 97.8 F (36.6 C), temperature source Oral, resp. rate 19, height 5\' 11"  (1.803 m), weight 102.3 kg, SpO2 99 %.  Filed Weights   12/27/20 1230 12/28/20 0454 12/29/20 0615  Weight: 108 kg 107 kg 102.3 kg    Labs & Radiologic Studies    CBC Recent Labs    12/27/20 0401 12/29/20 0154  WBC 4.9 4.2  HGB 13.7 14.0  HCT 44.3 41.0  MCV 89.9 86.3  PLT 97* 98*   Basic Metabolic Panel Recent Labs    12/28/20 0736 12/29/20 0154  NA 141 142  K 3.8 3.4*  CL 104 104  CO2 25 28  GLUCOSE 176* 262*  BUN 25* 23  CREATININE 1.39* 1.47*  CALCIUM 8.7* 8.5*   Liver Function  Tests No results for input(s): AST, ALT, ALKPHOS, BILITOT, PROT, ALBUMIN in the last 72 hours. No results for input(s): LIPASE, AMYLASE in the last 72 hours. High Sensitivity Troponin:   No results for input(s): TROPONINIHS in the last 720 hours.  BNP Invalid input(s): POCBNP D-Dimer No results for input(s): DDIMER in the last 72 hours. Hemoglobin A1C No results for input(s): HGBA1C in the last 72 hours. Fasting Lipid Panel No results for input(s): CHOL, HDL, LDLCALC, TRIG, CHOLHDL, LDLDIRECT in the last 72 hours. Thyroid Function Tests Recent Labs    12/26/20 1436  TSH 2.112   _____________  DG Chest 2 View  Result Date: 12/26/2020 CLINICAL DATA:  Atrial fibrillation.  Cough and shortness of breath EXAM: CHEST - 2 VIEW COMPARISON:  Chest CT December 31, 2017 FINDINGS: There is mild atelectatic change in the lateral right base. The lungs  elsewhere are clear. Heart is upper normal in size with pulmonary vascularity normal. No adenopathy. There are degenerative changes in the thoracic spine. IMPRESSION: Mild atelectasis lateral right base. Lungs elsewhere clear. Heart upper normal in size. Electronically Signed   By: Lowella Grip III M.D.   On: 12/26/2020 09:32   ECHOCARDIOGRAM COMPLETE  Result Date: 12/27/2020    ECHOCARDIOGRAM REPORT   Patient Name:   ANTUAN LIMES Date of Exam: 12/27/2020 Medical Rec #:  [id number]      Height:       71.0 in Accession #:    [id number]     Weight:       234.8 lb Date of Birth:  1951-02-11     BSA:          2.257 m Patient Age:    67 years       BP:           118/80 mmHg Patient Gender: M              HR:           64 bpm. Exam Location:  Inpatient Procedure: 2D Echo, Color Doppler and Cardiac Doppler Indications:    I48.92* Unspecified atrial flutter  History:        Patient has prior history of Echocardiogram examinations, most                 recent 12/27/2020. CAD, Arrythmias:RBBB; Risk                 Factors:Hypertension, Diabetes and Dyslipidemia.  Sonographer:    Raquel Sarna Senior RDCS Referring Phys: [id number] [place]  1. Severe global reduction in LV systolic function; probable fusion of right and left coronary cusps resulting in functionally bicuspid aortic valve; moderate AS (mean gradient 12 mmHg; AVA1 cm2); mild AI.  2. Left ventricular ejection fraction, by estimation, is 20 to 25%. The left ventricle has severely decreased function. The left ventricle demonstrates global hypokinesis. The left ventricular internal cavity size was moderately dilated. There is mild left ventricular hypertrophy. Left ventricular diastolic parameters are consistent with Grade III diastolic dysfunction (restrictive). Elevated left atrial pressure.  3. Right ventricular systolic function is normal. The right ventricular size is normal.  4. Left atrial size was severely dilated.  5. The mitral valve is  normal in structure. Mild mitral valve regurgitation. No evidence of mitral stenosis.  6. The aortic valve is bicuspid. Aortic valve regurgitation is mild. Moderate aortic valve stenosis.  7. Aortic dilatation noted. There is mild dilatation of the aortic root, measuring 40 mm. There is mild dilatation  of the ascending aorta, measuring 41 mm.  8. The inferior vena cava is dilated in size with <50% respiratory variability, suggesting right atrial pressure of 15 mmHg. FINDINGS  Left Ventricle: Left ventricular ejection fraction, by estimation, is 20 to 25%. The left ventricle has severely decreased function. The left ventricle demonstrates global hypokinesis. The left ventricular internal cavity size was moderately dilated. There is mild left ventricular hypertrophy. Left ventricular diastolic parameters are consistent with Grade III diastolic dysfunction (restrictive). Elevated left atrial pressure. Right Ventricle: The right ventricular size is normal. Right ventricular systolic function is normal. Left Atrium: Left atrial size was severely dilated. Right Atrium: Right atrial size was normal in size. Pericardium: Trivial pericardial effusion is present. Mitral Valve: The mitral valve is normal in structure. Mild mitral annular calcification. Mild mitral valve regurgitation. No evidence of mitral valve stenosis. Tricuspid Valve: The tricuspid valve is normal in structure. Tricuspid valve regurgitation is trivial. No evidence of tricuspid stenosis. Aortic Valve: The aortic valve is bicuspid. Aortic valve regurgitation is mild. Aortic regurgitation PHT measures 535 msec. Moderate aortic stenosis is present. Aortic valve mean gradient measures 12.0 mmHg. Aortic valve peak gradient measures 19.7 mmHg.  Aortic valve area, by VTI measures 1.01 cm. Pulmonic Valve: The pulmonic valve was normal in structure. Pulmonic valve regurgitation is trivial. No evidence of pulmonic stenosis. Aorta: Aortic dilatation noted. There is  mild dilatation of the aortic root, measuring 40 mm. There is mild dilatation of the ascending aorta, measuring 41 mm. Venous: The inferior vena cava is dilated in size with less than 50% respiratory variability, suggesting right atrial pressure of 15 mmHg. IAS/Shunts: No atrial level shunt detected by color flow Doppler. Additional Comments: Severe global reduction in LV systolic function; probable fusion of right and left coronary cusps resulting in functionally bicuspid aortic valve; moderate AS (mean gradient 12 mmHg; AVA1 cm2); mild AI.  LEFT VENTRICLE PLAX 2D LVIDd:         6.00 cm      Diastology LVIDs:         4.40 cm      LV e' medial:    4.03 cm/s LV PW:         1.10 cm      LV E/e' medial:  23.5 LV IVS:        1.20 cm      LV e' lateral:   4.57 cm/s LVOT diam:     1.90 cm      LV E/e' lateral: 20.7 LV SV:         45 LV SV Index:   20 LVOT Area:     2.84 cm  LV Volumes (MOD) LV vol d, MOD A2C: 164.0 ml LV vol d, MOD A4C: 185.0 ml LV vol s, MOD A2C: 116.0 ml LV vol s, MOD A4C: 151.0 ml LV SV MOD A2C:     48.0 ml LV SV MOD A4C:     185.0 ml LV SV MOD BP:      42.1 ml RIGHT VENTRICLE RV S prime:     7.62 cm/s TAPSE (M-mode): 1.5 cm LEFT ATRIUM              Index       RIGHT ATRIUM           Index LA diam:        5.10 cm  2.26 cm/m  RA Area:     17.80 cm LA Vol (A2C):   128.0 ml 56.70 ml/m  RA Volume:   45.10 ml  19.98 ml/m LA Vol (A4C):   93.0 ml  41.20 ml/m LA Biplane Vol: 113.0 ml 50.06 ml/m  AORTIC VALVE AV Area (Vmax):    1.10 cm AV Area (Vmean):   1.12 cm AV Area (VTI):     1.01 cm AV Vmax:           222.00 cm/s AV Vmean:          165.000 cm/s AV VTI:            0.447 m AV Peak Grad:      19.7 mmHg AV Mean Grad:      12.0 mmHg LVOT Vmax:         86.20 cm/s LVOT Vmean:        65.100 cm/s LVOT VTI:          0.160 m LVOT/AV VTI ratio: 0.36 AI PHT:            535 msec  AORTA Ao Root diam: 4.00 cm Ao Asc diam:  4.10 cm MITRAL VALVE MV Area (PHT): 3.36 cm    SHUNTS MV Decel Time: 226 msec    Systemic  VTI:  0.16 m MV E velocity: 94.70 cm/s  Systemic Diam: 1.90 cm MV A velocity: 45.40 cm/s MV E/A ratio:  2.09 Kirk Ruths MD Electronically signed by Kirk Ruths MD Signature Date/Time: 12/27/2020/1:47:58 PM    Final    ECHO TEE  Result Date: 12/27/2020    TRANSESOPHOGEAL ECHO REPORT   Patient Name:   SHAIL URBAS Date of Exam: 12/27/2020 Medical Rec #:  [id number]      Height:       71.0 in Accession #:    [id number]     Weight:       234.8 lb Date of Birth:  [birth year]     BSA:          2.257 m Patient Age:    35 years       BP:           112/87 mmHg Patient Gender: M              HR:           96 bpm. Exam Location:  Inpatient Procedure: Transesophageal Echo, 3D Echo, Color Doppler and Cardiac Doppler Indications:     I48.92* Unspecified atrial flutter  History:         Patient has prior history of Echocardiogram examinations, most                  recent 04/02/2013. CAD, Arrythmias:RBBB; Risk                  Factors:Hypertension, Diabetes and Dyslipidemia.  Sonographer:     Raquel Sarna Senior RDCS Referring Phys:  [id number] Leanor Kail Diagnosing Phys: Skeet Latch MD PROCEDURE: After discussion of the risks and benefits of a TEE, an informed consent was obtained from the patient. The transesophogeal probe was passed without difficulty through the esophogus of the patient. Imaged were obtained with the patient in a left lateral decubitus position. Local oropharyngeal anesthetic was provided with Cetacaine. Sedation performed by different physician. The patient was monitored while under deep sedation. Anesthestetic sedation was provided intravenously by Anesthesiology: 287mg  of Propofol. Image quality was excellent. The patient's vital signs; including heart rate, blood pressure, and oxygen saturation; remained stable throughout the procedure. The patient developed no complications during the procedure.  A successful direct current cardioversion  was performed at 200 joules with 1 attempt. IMPRESSIONS  1.  Left ventricular ejection fraction, by estimation, is <20%. The left ventricle has severely decreased function. The left ventricle demonstrates global hypokinesis. The left ventricular internal cavity size was mildly to moderately dilated.  2. Right ventricular systolic function is moderately reduced. The right ventricular size is normal.  3. No left atrial/left atrial appendage thrombus was detected. The LAA emptying velocity was 38 cm/s.  4. Multiple mild regurgitant jets that collectively cause moderate mitral regurgitation. The mitral valve is normal in structure. Moderate mitral valve regurgitation. No evidence of mitral stenosis.  5. There is fusion and calcification of the left and right coronary cusps. The aortic valve is bicuspid. Aortic valve regurgitation is not visualized. No aortic stenosis is present.  6. Aortic dilatation noted. There is mild dilatation of the ascending aorta, measuring 41 mm. Conclusion(s)/Recommendation(s): Normal biventricular function without evidence of hemodynamically significant valvular heart disease. FINDINGS  Left Ventricle: Left ventricular ejection fraction, by estimation, is <20%. The left ventricle has severely decreased function. The left ventricle demonstrates global hypokinesis. The left ventricular internal cavity size was mildly to moderately dilated. There is no left ventricular hypertrophy. Right Ventricle: The right ventricular size is normal. No increase in right ventricular wall thickness. Right ventricular systolic function is moderately reduced. Left Atrium: Left atrial size was normal in size. No left atrial/left atrial appendage thrombus was detected. The LAA emptying velocity was 38 cm/s. Right Atrium: Right atrial size was normal in size. Pericardium: There is no evidence of pericardial effusion. Mitral Valve: Multiple mild regurgitant jets that collectively cause moderate mitral regurgitation. The mitral valve is normal in structure. Moderate mitral  valve regurgitation. No evidence of mitral valve stenosis. Tricuspid Valve: The tricuspid valve is normal in structure. Tricuspid valve regurgitation is mild . No evidence of tricuspid stenosis. Aortic Valve: There is fusion and calcification of the left and right coronary cusps. The aortic valve is bicuspid. Aortic valve regurgitation is not visualized. No aortic stenosis is present. Pulmonic Valve: The pulmonic valve was normal in structure. Pulmonic valve regurgitation is mild. No evidence of pulmonic stenosis. Aorta: Aortic dilatation noted, the aortic arch was not well visualized and the aortic root is normal in size and structure. There is mild dilatation of the ascending aorta, measuring 41 mm. There is minimal (Grade I) layered plaque involving the descending aorta. IAS/Shunts: No atrial level shunt detected by color flow Doppler.  MR Peak grad:    86.9 mmHg MR Mean grad:    52.0 mmHg MR Vmax:         466.00 cm/s MR Vmean:        333.0 cm/s MR PISA:         3.08 cm MR PISA Eff ROA: 25 mm MR PISA Radius:  0.70 cm Skeet Latch MD Electronically signed by Skeet Latch MD Signature Date/Time: 12/27/2020/12:28:05 PM    Final    Disposition   Pt is being discharged home today in good condition.  Follow-up Plans & Appointments     Follow-up Information    [place]man, [place], [place]. Go on 01/05/2021.   Specialty: Cardiology Why: Hospital follow-up with Cardiology scheduled for 01/05/2021 at 3:15pm. Please arrive 15 minutes early for check-in. Contact information: [address] [place] [address] [phone]        Hamrick, Lorin Mercy, MD. Call.   Specialty: Family Medicine Why: Please call your PCP to scheduled follow-up visit within 2-3 weeks. Contact information: [address] [address] [phone]              Discharge Instructions    Diet - low sodium heart healthy   Complete by: As directed    Increase activity slowly   Complete by: As  directed       Discharge Medications   Allergies as of 12/29/2020      Reactions   Actos [pioglitazone] Swelling, Cough   SWELLING REACTION UNSPECIFIED  EDEMA      Medication List    STOP taking these medications   amLODipine 10 MG tablet Commonly known as: NORVASC   aspirin EC 81 MG tablet   metoprolol tartrate 50 MG tablet Commonly known as: LOPRESSOR   naproxen sodium 220 MG tablet Commonly known as: ALEVE   valsartan-hydrochlorothiazide 320-25 MG tablet Commonly known as: DIOVAN-HCT     TAKE these medications   amiodarone 200 MG tablet Commonly known as: PACERONE Take 200mg  (1 tablet) twice daily for 2 weeks. Then on 01/12/2021, reduce dose to 200mg  once daily.   apixaban 5 MG Tabs tablet Commonly known as: ELIQUIS Take 1 tablet (5 mg total) by mouth 2 (two) times daily.   Basaglar KwikPen 100 UNIT/ML Inject 70 Units into the skin daily with breakfast.   dapagliflozin propanediol 10 MG Tabs tablet Commonly known as: Farxiga Take 1 tablet (10 mg total) by mouth daily before breakfast.   Entresto 49-51 MG Generic drug: sacubitril-valsartan Take 1 tablet by mouth 2 (two) times daily.   furosemide 40 MG tablet Commonly known as: Lasix Take 1 tablet (40 mg total) by mouth daily as needed (weight gain of 3lbs in 1 day or 5lbs in 1 week).   metoprolol succinate 50 MG 24 hr tablet Commonly known as: TOPROL-XL Take 1 tablet (50 mg total) by mouth daily. Take with or immediately following a meal.   nitroGLYCERIN 0.4 MG SL tablet Commonly known as: NITROSTAT Place 1 tablet (0.4 mg total) under the tongue every 5 (five) minutes as needed for chest pain.   OneTouch Delica Plus [id number] Misc 1 each by Other route 2 (two) times daily. E11.9   OneTouch Ultra test strip Generic drug: glucose blood 1 each by Other route 2 (two) times daily. E11.9   Pen Needles 30G X 8 MM Misc 1 each by Does not apply route daily. E11.9   rosuvastatin 20 MG tablet Commonly  known as: CRESTOR Take 1 tablet (20 mg total) by mouth daily. What changed:   medication strength  how much to take   sildenafil 100 MG tablet Commonly known as: VIAGRA Take 100 mg by mouth daily as needed for erectile dysfunction.   spironolactone 25 MG tablet Commonly known as: Aldactone Take 0.5 tablets (12.5 mg total) by mouth daily.   trolamine salicylate 10 % cream Commonly known as: ASPERCREME Apply 1 application topically as needed for muscle pain.   Trulicity 4.5 VE/9.3YB Sopn Generic drug: Dulaglutide Inject 4.5 mg into the skin once a week. What changed: additional instructions          Outstanding Labs/Studies   Repeat BMET at follow-up next week. Repeat lipid panel/LFTs in 2 months.  Duration of Discharge Encounter   Greater than 30 minutes including physician time.  Signed, Darreld Mclean, PA-C 12/29/2020, 1:23 PM   Personally seen and examined. Agree with above.   70 year old male with EF less than 20% in the setting of rapid atrial flutter.  Underwent cardioversion.  Successful with amiodarone.  Diuresed  6.5 liters.  Weight 225 pounds.  Feels much better, legs are no longer weeping.  Breathing better.  Plan will be to repeat echocardiogram in the next 2 to 3 months to see if resolution of poor ejection fraction occurs.  He has had this happen in the past.  We will continue with amiodarone 200 twice daily for the next 2 weeks and then 200 mg a day thereafter.  Monitor labs closely.  Watch for side effects.  He does have a right bundle branch block and first degree AV block and left anterior fascicular block as well.  Watch for signs of worsening conduction disorder especially in the setting of amiodarone.  May need pacemaker.  Delene Loll has been added, mid dose.  Spironolactone has been added as well.  We will closely watch his basic metabolic profile.  We will start off with Lasix 40 mg as needed if weight goes up greater than 3 pounds.   Of course if he starts to utilize this more and more, we may need standing Lasix.  Okay with discharge.  He works as a Freight forwarder, mostly at desk, computer, I would not be opposed to him continuing this work.  Watch for any worsening signs however.  Candee Furbish, MD

## 2020-12-29 NOTE — Discharge Instructions (Signed)
Medication Changes: - STOP Aspirin. - STOP Aleve. If you need something as needed for pain, we recommend Tylenol instead. - STOP Valsartan/HCTZ.  - STOP Amlodipine.  - STOP Metoprolol tartrate (Lopressor). START Metoprolol succinate 50mg  once daily.  - START Entresto 49-51mg  twice daily. This medication is for your heart failure. - START Spironolactone 12.5mg  once daily. This medication is for your heart failure. - START Farxiga 10mg  once daily. This medication is helpful for heart failure and diabetes. - START Amiodarone 200mg  twice daily for 2 weeks. Then on 01/12/2021, reduce this dose to 200mg  once daily. - START Eliquis 5mg  twice daily instead. This is the blood thinner that we use for atrial flutter to help reduce your risk of having a stroke. - INCREASE Crestor to 20mg  daily. You can take two of your 10mg  tablets daily until your new prescription arrives.  As discussed, I have sent a 1 month supply of all your new medications to our Pharmacy here in the hospital. I have also sent a 90-day supply of these medications to [facility].  Heart Failure Education: 1. Weigh yourself EVERY morning after you go to the bathroom but before you eat or drink anything. Write this number down in a weight log/diary. If you gain 3 pounds overnight or 5 pounds in a week, call the office. 2. Take your medicines as prescribed. If you have concerns about your medications, please call us before you stop taking them.  3. Eat low salt foods--Limit salt (sodium) to 2000 mg per day. This will help prevent your body from holding onto fluid. Read food labels as many processed foods have a lot of sodium, especially canned goods and prepackaged meats. If you would like some assistance choosing low sodium foods, we would be happy to set you up with a nutritionist. 4. Stay as active as you can everyday. Staying active will give you more energy and make your muscles stronger. Start with 5 minutes at a time and work your  way up to 30 minutes a day. Break up your activities--do some in the morning and some in the afternoon. Start with 3 days per week and work your way up to 5 days as you can.  If you have chest pain, feel short of breath, dizzy, or lightheaded, STOP. If you don't feel better after a short rest, call 911. If you do feel better, call the office to let us know you have symptoms with exercise. 5. Limit all fluids for the day to less than 2 liters. Fluid includes all drinks, coffee, juice, ice chips, soup, jello, and all other liquids.

## 2020-12-29 NOTE — Progress Notes (Signed)
Was successful in obtaining a copay card for [place].  This copay card will make the patients copay $0.  The billing information is as follows and has been shared with the patients pharmacy.   RxBin: [id number] PCN: [id number] Member ID: [id number] Group ID: [id number]  Harriet Pho, PharmD PGY-1 [facility] Resident  12/29/2020 9:47 AM

## 2020-12-29 NOTE — Progress Notes (Signed)
Heart Failure Stewardship Pharmacist Progress Note   PCP: Hamrick, Lorin Mercy, MD PCP-Cardiologist: Candee Furbish, MD    HPI:  70 yo M with PMH of CAD, aflutter, HTN, diabetes, HLD, HFrEF, and bifascicular block/right bundle branch block. He presented to the ED on 12/26/20 with worsening shortness of breath, orthopnea, PND, LE edema, and found to be in afib RVR. An ECHO was done on 12/27/20 and LVEF is severely reduced to 20-25%. S/p successful TEE/DCCV on 12/27/20.  Current HF Medications: Furosemide 40 mg IV BID Metoprolol XL 50 mg daily Entresto 24/26 mg BID  Prior to admission HF Medications: Metoprolol tartrate 50 mg BID Valsartan 320 mg daily  Pertinent Lab Values: . Serum creatinine 1.47 (bump from 1.39), BUN 23, Potassium 3.4, Sodium 142, BNP 412   Vital Signs: . Weight: 225 lbs (admission weight: 238 lbs) . Blood pressure: 130-150/70-80s  . Heart rate: 70s   Medication Assistance / Insurance Benefits Check: Does the patient have prescription insurance?  Yes Type of insurance plan: [employer]  Outpatient Pharmacy:  Prior to admission outpatient pharmacy: [facility] Is the patient willing to use [place] at discharge? Yes Is the patient willing to transition their outpatient pharmacy to utilize a [facility] outpatient pharmacy?   Pending    Assessment: 1. 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 NYHA class II symptoms. - Continue furosemide 40 mg IV BID  - Agree with transitioning to metoprolol XL 50 mg daily - Agree with starting Entresto 24/26 mg BID - Consider starting spironolactone 12.5 mg daily prior to discharge - Consider starting Farxiga 10 mg daily today - renal function stable, could greatly benefit DM as well (last A1c 8.3)   Plan: 1) Medication changes recommended at this time: - Consider starting Farxiga today  - Consider starting spironolactone prior to discharge  2) Patient  assistance: - Entresto copay: $30/month - will enroll him in month copay card to bring this down to $10 per 30/60/90 day supply - [place] prior authorization completed and approved  3)  Education  - To be completed prior to discharge  Harriet Pho, PharmD PGY-1 [facility]  12/29/2020 9:20 AM  Kerby Nora, PharmD, BCPS Heart Failure Stewardship Pharmacist Phone [phone]

## 2020-12-30 NOTE — Telephone Encounter (Signed)
Patient contacted regarding discharge from [facility] on 12/27/20.  Patient understands to follow up with provider Dr. Curly Shores at 01/05/21 at [facility] on [facility] Patient understands discharge instructions? Yes Patient understands medications and regiment? Yes Patient understands to bring all medications to this visit? Yes  Ask patient:  Are you enrolled in My Chart (yes or no)  If no ask patient if they would like to enroll/pending   RN called and spoke with the patient and discussed his discharge from hospital on 12/27/20. Pt stated that he got and understood his Discharge summary.  Pt stated that he understood to continue his medications as prescribed, and bring his medications to his follow up appointment  RN stated that if patient had any questions or concerns to call, he stated he understood these recommendations.

## 2021-01-05 ENCOUNTER — Encounter: Payer: Self-pay | Admitting: Orthopaedic Surgery

## 2021-01-05 ENCOUNTER — Ambulatory Visit: Payer: BC Managed Care – PPO | Admitting: Orthopaedic Surgery

## 2021-01-05 ENCOUNTER — Other Ambulatory Visit: Payer: Self-pay

## 2021-01-05 ENCOUNTER — Ambulatory Visit: Payer: Self-pay

## 2021-01-05 ENCOUNTER — Encounter: Payer: Self-pay | Admitting: Physician Assistant

## 2021-01-05 ENCOUNTER — Ambulatory Visit: Payer: BC Managed Care – PPO | Admitting: Physician Assistant

## 2021-01-05 VITALS — BP 120/68 | HR 72 | Ht 71.0 in | Wt 226.0 lb

## 2021-01-05 DIAGNOSIS — Z9889 Other specified postprocedural states: Secondary | ICD-10-CM

## 2021-01-05 DIAGNOSIS — I110 Hypertensive heart disease with heart failure: Secondary | ICD-10-CM

## 2021-01-05 DIAGNOSIS — I5042 Chronic combined systolic (congestive) and diastolic (congestive) heart failure: Secondary | ICD-10-CM

## 2021-01-05 DIAGNOSIS — I483 Typical atrial flutter: Secondary | ICD-10-CM | POA: Diagnosis not present

## 2021-01-05 DIAGNOSIS — I2510 Atherosclerotic heart disease of native coronary artery without angina pectoris: Secondary | ICD-10-CM | POA: Diagnosis not present

## 2021-01-05 DIAGNOSIS — E782 Mixed hyperlipidemia: Secondary | ICD-10-CM

## 2021-01-05 DIAGNOSIS — N183 Chronic kidney disease, stage 3 unspecified: Secondary | ICD-10-CM

## 2021-01-05 DIAGNOSIS — Z96652 Presence of left artificial knee joint: Secondary | ICD-10-CM

## 2021-01-05 NOTE — Progress Notes (Signed)
Cardiology Office Note:    Date:  01/05/2021   ID:  Matthew Schmitt, DOB 03-25-1951, MRN [id number]  PCP:  Leonides Sake, MD  [facility] Cardiologist:  Candee Furbish, MD  [facility] Electrophysiologist:  None   Chief Complaint: hospital follow up  History of Present Illness:    Matthew Schmitt is a 70 y.o. male with a hx of non-obstructive CAD with moderate stenosis of LAD on cardiac catheterization in 2012, non-ischemic cardiomyopathy secondary to bifascicular block/RBBB with chronic combined CHF, paroxysmal atrial flutter, hypertension, hyperlipidemia, OSA (did not tolerated CPAP) , and uncontrolled diabetes presents for hospital follow up.   Patient is a previous patient of Dr. Wynonia Lawman. He has a known history of cardiomyopathy thought to be tachymediated in nature. He was previously on Xarelto for atrial flutter but this was reportedly discontinued due to hematuria (due to kidney stones). Remote cardiac catheterization in 2012 showed moderate stenosis of LAD just after the bifurcation of a large diagonal branch. Prior Echo in 2014 showed LVEF of 25-30%. He was previously treated for sleep apnea but did not tolerate CPAP.  Since Dr. Wynonia Lawman retired, patient has been seen by Dr. Bettina Gavia. At office visit on 12/19/2020, he was noted to be in 2-1 atrial flutter with rates in the 160's. He was unaware of elevated heart rates at this time. He was started on Lopressor, Amlodipine, Valsartan-HCTZ. Dr. Joya Gaskins note also mention restarting anticoagulation and monitoring closely to see if hematuria worsens. However, it does not look like any anticoagulation was ordered.  Presented to [facility] 12/26/20 with with worsening shortness of breath, orthopnea, PND, and lower extremity edema. Admitted for acute on chronic CHF and atrial flutter with RVR. He was started on IV Amiodarone. Echo showed LVEF of 20-25%, with global hypokinesis, grade III, and moderate AS (mean gradient), mild AI, mild MR, and mild  dilatation aortic root (43mm) and ascending aorta (55mm).  He underwent successful TEE/DCCV on 2/8 and has maintained sinus rhythm since then with only occasional brief runs of what looks like PAT. He was transitioned to taper dose of PO Amiodarone. Diuresed 6.4L. Started on [place], spironolactone and [place].   Here today for follow up. No complaints. Edema and shortness of breath has been resolved. No chest pain, palpitation, orthopnea, PND, syncope, hematuria or melena. Compliant with low-sodium diet medications.   Past Medical History:  Diagnosis Date  . Anxiety   . Arthritis    everywhere  . Arthrosis of right acromioclavicular joint 01/29/2020  . Atrial flutter [facility])    Onset May 2014 Cardioversion done   . BPH associated with nocturia   . BPH with obstruction/lower urinary tract symptoms 01/04/2017  . CAD (coronary artery disease) 04/02/2013   Cath 2012 moderate mid LAD lesion, otherwise not much disease   . Coronary artery disease    cardiologist-  dr Bettina Gavia (previouly dr Wynonia Lawman)  . Diabetes ([place]) 01/18/2018  . Encounter for long-term (current) use of other medications 07/27/2013  . Essential hypertension, benign   . First degree heart block   . Heart murmur   . History of bilateral knee replacement 02/07/2017  . History of bladder stone   . History of kidney stones   . History of melanoma excision    June 2016  --- s/p  excision right  leg (per pt localized and no recurrence)  . History of renal cell carcinoma 01/2017   s/p  left partial nephrecotmy  . Hypertensive heart disease 04/02/2013  . Impingement syndrome of  right shoulder 01/29/2020  . Impotence of organic origin   . Left-sided low back pain with left-sided sciatica 01/23/2019  . Long term current use of anticoagulant therapy   . Mixed hyperlipidemia    slightly  . Multiple renal cysts    bilateral  . Nephrolithiasis 05/17/2014  . Nonischemic cardiomyopathy ([place])   . Osteoarthritis   . PAF (paroxysmal atrial  fibrillation) ([place])    followed by cardiology  (takes ASA)  . RBBB   . 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 Right ureteral stone   . Rotator cuff syndrome of left shoulder 04/17/2016  . Status post total left knee replacement 01/01/2020  . Tendinopathy of right biceps tendon 01/29/2020  . Tendinopathy of right rotator cuff 01/29/2020  . Type 2 diabetes mellitus treated with insulin [facility])    endocrinologist-  dr Loanne Drilling  . Wears glasses     Past Surgical History:  Procedure Laterality Date  . CARDIAC CATHETERIZATION  08-17-2011  DR SPENCER TILLEY   POSSIBLE MODERATE LAD STENOSIS JUST AFTER THE BIFURCATION OF LARGE DIAGONAL BRANCH/ LVEF  40-45%/ MILD GLOBAL HYPODINESIS (CATH DONE FOR ABNORMAL STRESS TEST OF FIXED LATERAL WALL DEFECT & BIFASCICULAR BLAOCK)  . CARDIOVERSION N/A 04/02/2013   Procedure: CARDIOVERSION;  Surgeon: Jacolyn Reedy, MD;  Location: [place];  Service: Cardiovascular;  Laterality: N/A;  . CARDIOVERSION N/A 12/27/2020   Procedure: CARDIOVERSION;  Surgeon: Skeet Latch, MD;  Location: [place];  Service: Cardiovascular;  Laterality: N/A;  . CYSTOSCOPY WITH LITHOLAPAXY N/A 05/18/2015   Procedure: CYSTOSCOPY WITH LITHOLAPAXY;  Surgeon: Rana Snare, MD;  [facility];  Service: Urology;  Laterality: N/A;  . CYSTOSCOPY WITH LITHOLAPAXY N/A 01/04/2017   Procedure: CYSTOSCOPY WITH LITHOLAPAXY, Holmium Laser;  Surgeon: Ardis Hughs, MD;  Location: [facility];  Service: Urology;  Laterality: N/A;  . CYSTOSCOPY WITH RETROGRADE PYELOGRAM, URETEROSCOPY AND STENT PLACEMENT  11/03/2012   Procedure: CYSTOSCOPY WITH RETROGRADE PYELOGRAM, URETEROSCOPY AND STENT PLACEMENT;  Surgeon: Bernestine Amass, MD;  Location: [facility];  Service: Urology;  Laterality: Left;  Cystoscopy/Left Retrograde/Ureteroscopy/Holmium Laser Litho/Double J Stent  . CYSTOSCOPY WITH URETEROSCOPY, STONE BASKETRY AND STENT  PLACEMENT Right 05/15/2019   Procedure: CYSTOSCOPY WITH URETEROSCOPY, LASER LITHOTRIPSY STONE BASKETRY AND STENT PLACEMENT, RIGHT RETROGRADE;  Surgeon: Ardis Hughs, MD;  Location[facility];  Service: Urology;  Laterality: Right;  . HOLMIUM LASER APPLICATION  17/61/6073   Procedure: HOLMIUM LASER APPLICATION;  Surgeon: Bernestine Amass, MD;  Location: [facility];  Service: Urology;  Laterality: Left;  . HOLMIUM LASER APPLICATION N/A 05/28/6268   Procedure: HOLMIUM LASER APPLICATION;  Surgeon: Rana Snare, MD;  Location[facility];  Service: Urology;  Laterality: N/A;  . KNEE SURGERY Bilateral [place]  . MELANOMA EXCISION Bilateral June 2016   20th-- left leg //  1st-- right leg w/ skin graft from right thigh (no lymph node bx)  . NEPHROLITHOTOMY Right 05/17/2014   Procedure: NEPHROLITHOTOMY PERCUTANEOUS;  Surgeon: Bernestine Amass, MD;  Location: [facility];  Service: Urology;  Laterality: Right;  . PILONIDAL CYST EXCISION    . ROBOTIC ASSITED PARTIAL NEPHRECTOMY Left 06/18/2013   Procedure: ROBOTIC ASSISTED PARTIAL NEPHRECTOMY;  Surgeon: Dutch Gray, MD;  Location: [facility];  Service: Urology;  Laterality: Left;  . ROTATOR CUFF REPAIR Left 07/2016  . SHOULDER ARTHROSCOPY WITH ROTATOR CUFF REPAIR AND SUBACROMIAL DECOMPRESSION Right 05/27/2020   Procedure: RIGHT  SHOULDER ARTHROSCOPY WITH EXTENSIVE DEBRIDEMENT, DISTAL CLAVICLE EXCISION AND SUBACROMIAL DECOMPRESSION;  Surgeon: Leandrew Koyanagi, MD;  Location: [place];  Service: Orthopedics;  Laterality: Right;  . TEE WITH CARDIOVERSION  12/27/2020  . TEE WITHOUT CARDIOVERSION N/A 12/27/2020   Procedure: TRANSESOPHAGEAL ECHOCARDIOGRAM (TEE);  Surgeon: Skeet Latch, MD;  Location: [place];  Service: Cardiovascular;  Laterality: N/A;  . TONSILLECTOMY  5  (age 45)  . TOTAL KNEE ARTHROPLASTY Left 02/07/2017   Procedure: LEFT TOTAL KNEE ARTHROPLASTY;  Surgeon: Leandrew Koyanagi, MD;   Location: [place];  Service: Orthopedics;  Laterality: Left;  . TOTAL KNEE ARTHROPLASTY Right 04/25/2017   Procedure: RIGHT TOTAL KNEE ARTHROPLASTY;  Surgeon: Leandrew Koyanagi, MD;  Location: [place];  Service: Orthopedics;  Laterality: Right;  . TRANSURETHRAL RESECTION OF PROSTATE N/A 01/04/2017   Procedure: TRANSURETHRAL RESECTION OF THE PROSTATE (TURP);  Surgeon: Ardis Hughs, MD;  Location[facility];  Service: Urology;  Laterality: N/A;    Current Medications: Current Meds  Medication Sig  . amiodarone (PACERONE) 200 MG tablet Take 200mg  (1 tablet) twice daily for 2 weeks. Then on 01/12/2021, reduce dose to 200mg  once daily.  Marland Kitchen apixaban (ELIQUIS) 5 MG TABS tablet Take 1 tablet (5 mg total) by mouth 2 (two) times daily.  . dapagliflozin propanediol (FARXIGA) 10 MG TABS tablet Take 1 tablet (10 mg total) by mouth daily before breakfast.  . Dulaglutide (TRULICITY) 4.5 GB/1.5VV SOPN Inject 4.5 mg into the skin once a week.  . furosemide (LASIX) 40 MG tablet Take 1 tablet (40 mg total) by mouth daily as needed (weight gain of 3lbs in 1 day or 5lbs in 1 week).  Marland Kitchen glucose blood (ONETOUCH ULTRA) test strip 1 each by Other route 2 (two) times daily. E11.9  . Insulin Glargine (BASAGLAR KWIKPEN) 100 UNIT/ML Inject 70 Units into the skin daily with breakfast.  . Insulin Pen Needle (PEN NEEDLES) 30G X 8 MM MISC 1 each by Does not apply route daily. E11.9  . Lancets (ONETOUCH DELICA PLUS [id number]) MISC 1 each by Other route 2 (two) times daily. E11.9  . metoprolol succinate (TOPROL-XL) 50 MG 24 hr tablet Take 1 tablet (50 mg total) by mouth daily. Take with or immediately following a meal.  . nitroGLYCERIN (NITROSTAT) 0.4 MG SL tablet Place 1 tablet (0.4 mg total) under the tongue every 5 (five) minutes as needed for chest pain.  . rosuvastatin (CRESTOR) 20 MG tablet Take 1 tablet (20 mg total) by mouth daily.  . sacubitril-valsartan (ENTRESTO) 49-51 MG Take 1 tablet by mouth 2 (two) times daily.  Marland Kitchen  spironolactone (ALDACTONE) 25 MG tablet Take 25 mg by mouth every other day.  . trolamine salicylate (ASPERCREME) 10 % cream Apply 1 application topically as needed for muscle pain.     Allergies:   Actos [pioglitazone]   Social History   Socioeconomic History  . Marital status: Married    Spouse name: Not on file  . Number of children: 2  . Years of education: 29  . Highest education level: Not on file  Occupational History  . Occupation: Government social research officer, Psychologist, occupational  Tobacco Use  . Smoking status: Former Smoker    Packs/day: 1.00    Years: 6.00    Pack years: 6.00    Types: Cigarettes    Quit date: 10/31/1969    Years since quitting: 51.2  . Smokeless tobacco: Never Used  Vaping Use  . Vaping Use: Never used  Substance and Sexual Activity  .  Alcohol use: Yes    Alcohol/week: 0.0 standard drinks    Comment: OCCASIONAL  . Drug use: No  . Sexual activity: Never  Other Topics Concern  . Not on file  Social History Narrative   Regular exercise-no   Social Determinants of Health   Financial Resource Strain: Not on file  Food Insecurity: Not on file  Transportation Needs: Not on file  Physical Activity: Not on file  Stress: Not on file  Social Connections: Not on file     Family History: The patient's family history includes Arthritis in his father and mother; Cancer in his maternal uncle and paternal uncle; Diabetes in his brother and father; Heart attack (age of onset: 49) in his brother; Hypertension in his father and mother; Lymphoma in his paternal grandfather.    ROS:   Please see the history of present illness.    All other systems reviewed and are negative.   EKGs/Labs/Other Studies Reviewed:    The following studies were reviewed today: Echo 12/27/2020 1. Severe global reduction in LV systolic function; probable fusion of  right and left coronary cusps resulting in functionally bicuspid aortic  valve; moderate AS (mean gradient 12 mmHg; AVA1 cm2);  mild AI.  2. Left ventricular ejection fraction, by estimation, is 20 to 25%. The  left ventricle has severely decreased function. The left ventricle  demonstrates global hypokinesis. The left ventricular internal cavity size  was moderately dilated. There is mild  left ventricular hypertrophy. Left ventricular diastolic parameters are  consistent with Grade III diastolic dysfunction (restrictive). Elevated  left atrial pressure.  3. Right ventricular systolic function is normal. The right ventricular  size is normal.  4. Left atrial size was severely dilated.  5. The mitral valve is normal in structure. Mild mitral valve  regurgitation. No evidence of mitral stenosis.  6. The aortic valve is bicuspid. Aortic valve regurgitation is mild.  Moderate aortic valve stenosis.  7. Aortic dilatation noted. There is mild dilatation of the aortic root,  measuring 40 mm. There is mild dilatation of the ascending aorta,  measuring 41 mm.  8. The inferior vena cava is dilated in size with <50% respiratory  variability, suggesting right atrial pressure of 15 mmHg.   EKG:  EKG is  ordered today.  The ekg ordered today demonstrates sinus rhythm, right bundle branch block  Recent Labs: 12/19/2020: ALT 41; NT-Pro BNP 3,563 12/26/2020: B Natriuretic Peptide 412.0; TSH 2.112 12/29/2020: BUN 23; Creatinine, Ser 1.47; Hemoglobin 14.0; Platelets 98; Potassium 3.4; Sodium 142  Recent Lipid Panel    Component Value Date/Time   CHOL 149 12/19/2020 1656   TRIG 165 (H) 12/19/2020 1656   HDL 42 12/19/2020 1656   CHOLHDL 3.5 12/19/2020 1656   CHOLHDL 4.2 07/20/2014 0822   VLDL 26 07/20/2014 0822   LDLCALC 79 12/19/2020 1656     Risk Assessment/Calculations:    CHA2DS2-VASc Score = 5  This indicates a 7.2% annual risk of stroke. The patient's score is based upon: CHF History: Yes HTN History: Yes Diabetes History: Yes Stroke History: No Vascular Disease History: Yes Age Score: 1 Gender Score:  0    Physical Exam:    VS:  BP 120/68   Pulse 72   Ht 5\' 11"  (1.803 m)   Wt 226 lb (102.5 kg)   SpO2 97%   BMI 31.52 kg/m     Wt Readings from Last 3 Encounters:  01/05/21 226 lb (102.5 kg)  12/29/20 225 lb 9.6 oz (102.3 kg)  12/20/20 234 lb 12.8 oz (106.5 kg)     GEN: Well nourished, well developed in no acute distress HEENT: Normal NECK: No JVD; No carotid bruits LYMPHATICS: No lymphadenopathy CARDIAC: RRR, 2/6 systolic murmurs, rubs, gallops RESPIRATORY:  Clear to auscultation without rales, wheezing or rhonchi  ABDOMEN: Soft, non-tender, non-distended MUSCULOSKELETAL:  No edema; No deformity  SKIN: Warm and dry NEUROLOGIC:  Alert and oriented x 3 PSYCHIATRIC:  Normal affect   ASSESSMENT AND PLAN:    1. Paroxysmal atrial flutter s/p TEE cardioversion -Maintaining sinus rhythm -No bleeding issue. Hematuria has not worsened. -Continue amiodarone, beta-blocker and Eliquis - Watch for conduction disease abnormality given bifascicular block  2.  Chronic combined CHF -Prior history of tachycardia mediated cardiomyopathy felt to be nonischemic in nature -Echo February guideline LV function of 20 to 67%, grade 2 diastolic dysfunction -Continue guideline directed heart failure regimen -Repeat echocardiogram in 3 to 4 months -Did not require Lasix since discharge  3.  Nonobstructive CAD -No aspirin due to need of anticoagulation -No angina  4.  Valvular heart disease - Moderate AAS, mild AI and mild MR by echocardiogram -Follow with routine echo  5. HTN -Blood pressure stable and controlled on current medication  6. DM -Per PCP  7. HLD -Continue statin  8. CKD III -Check renal function today  Medication Adjustments/Labs and Tests Ordered: Current medicines are reviewed at length with the patient today.  Concerns regarding medicines are outlined above.  Orders Placed This Encounter  Procedures  . Basic metabolic panel  . EKG 12-Lead   No orders of the  defined types were placed in this encounter.   Patient Instructions  Medication Instructions:  Your physician recommends that you continue on your current medications as directed. Please refer to the Current Medication list given to you today.  *If you need a refill on your cardiac medications before your next appointment, please call your pharmacy*   Lab Work: TODAY:  BMET   If you have labs (blood work) drawn today and your tests are completely normal, you will receive your results only by: Marland Kitchen MyChart Message (if you have MyChart) OR . A paper copy in the mail If you have any lab test that is abnormal or we need to change your treatment, we will call you to review the results.   Testing/Procedures: None ordered   Follow-Up: At [facility], you and your health needs are our priority.  As part of our continuing mission to provide you with exceptional heart care, we have created designated Provider Care Teams.  These Care Teams include your primary Cardiologist (physician) and Advanced Practice Providers (APPs -  Physician Assistants and Nurse Practitioners) who all work together to provide you with the care you need, when you need it.  We recommend signing up for the patient portal called "MyChart".  Sign up information is provided on this After Visit Summary.  MyChart is used to connect with patients for Virtual Visits (Telemedicine).  Patients are able to view lab/test results, encounter notes, upcoming appointments, etc.  Non-urgent messages can be sent to your provider as well.   To learn more about what you can do with MyChart, go to [email or web address].    Your next appointment:   3 month(s)  The format for your next appointment:   In Person  Provider:   Candee Furbish, MD   Other Instructions     Signed, Leanor Kail, PA  01/05/2021 3:33 PM    [facility]

## 2021-01-05 NOTE — Progress Notes (Signed)
Office Visit Note   Patient: Matthew Schmitt           Date of Birth: [birth year]           MRN: [id number] Visit Date: 01/05/2021              Requested by: Leonides Sake, MD [place],  [place] [address] PCP: Leonides Sake, MD   Assessment & Plan: Visit Diagnoses:  1. S/P TKR (total knee replacement), left   2. S/P arthroscopy of right shoulder     Plan: impression is status post right shoulder arthroscopy doing well and left total knee replacement with radiographic evidence of loosing of the tibial tray on initial post-op x-rays although this continues to look stable.  He has regained full range of motion and function of the shoulder and he is pleased with the outcome.  The left knee continues do well for him.  He is not really reporting any symptoms of loosening.  He has occasional soreness.  At this point we will see him back in a year with repeat two-view x-rays of the left knee but sooner if he develops any problems.  Follow-Up Instructions: Return in about 1 year (around 01/05/2022).   Orders:  Orders Placed This Encounter  Procedures  . XR Knee 1-2 Views Left   No orders of the defined types were placed in this encounter.     Procedures: No procedures performed   Clinical Data: No additional findings.   Subjective: Chief Complaint  Patient presents with  . Right Shoulder - Pain    HPI patient is a pleasant 70 year old gentleman who comes in today 7 months out right shoulder arthroscopic debridement and decompression.  He has been doing well.  He is regained full strength range of motion.  The other issue is his left knee.  He is status post left total knee replacement 04/25/2017.  He has had radiographic evidence of tibial tray loosening over the past 2 years.  He notes intermittent soreness but nothing more.  Previous inflammatory markers obtained which were within normal limits.  Review of Systems as detailed in HPI.  All others reviewed and  are negative.   Objective: Vital Signs: There were no vitals taken for this visit.  Physical Exam well-developed well-nourished gentleman in no acute distress.  Alert oriented x3.  Ortho Exam right shoulder exam shows full range of motion and strength.  He is neurovascular intact distally.  Specialty Comments:  No specialty comments available.  Imaging: XR Knee 1-2 Views Left  Result Date: 01/05/2021 X-rays show a stable prosthesis compared to prior xray.  No interval worsening of the tibial tray.      PMFS History: Patient Active Problem List   Diagnosis Date Noted  . Acute on chronic combined systolic and diastolic CHF (congestive heart failure) ([place]) 12/29/2020  . AKI (acute kidney injury) ([place]) 12/29/2020  . Hypokalemia 12/29/2020  . Obstructive sleep apnea 12/29/2020  . Thrombocytopenia ([place]) 12/29/2020  . Hematuria 12/29/2020  . Atrial flutter with rapid ventricular response ([place]) 12/26/2020  . Wears glasses   . Type 2 diabetes mellitus treated with insulin ([place])   . Right ureteral stone   . RBBB   . PAF (paroxysmal atrial fibrillation) ([place])   . Multiple renal cysts   . Impotence of organic origin   . History of melanoma excision   . History of kidney stones   . History of bladder stone   . Heart murmur   .  First degree heart block   . Hypertension   . Coronary artery disease   . BPH associated with nocturia   . Arthritis   . Anxiety   . Tendinopathy of right rotator cuff 01/29/2020  . Tendinopathy of right biceps tendon 01/29/2020  . Impingement syndrome of right shoulder 01/29/2020  . Arthrosis of right acromioclavicular joint 01/29/2020  . Chronic right shoulder pain 01/01/2020  . Status post total left knee replacement 01/01/2020  . Left-sided low back pain with left-sided sciatica 01/23/2019  . Diabetes ([place]) 01/18/2018  . History of bilateral knee replacement 02/07/2017  . Primary osteoarthritis of left knee   . History of renal cell carcinoma  01/2017  . BPH with obstruction/lower urinary tract symptoms 01/04/2017  . Rotator cuff syndrome of left shoulder 04/17/2016  . Nephrolithiasis 05/17/2014  . Encounter for long-term (current) use of other medications 07/27/2013  . Long term current use of anticoagulant therapy   . CAD (coronary artery disease) 04/02/2013  . Hypertensive heart disease 04/02/2013  . 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 Atrial flutter ([place])   . Nonischemic cardiomyopathy ([place])   . Obesity (BMI 30-39.9)   . History of kidney stones   . Osteoarthritis   . Hyperlipidemia    Past Medical History:  Diagnosis Date  . Anxiety   . Arthritis    everywhere  . Arthrosis of right acromioclavicular joint 01/29/2020  . Atrial flutter [facility])    Onset May 2014 Cardioversion done   . BPH associated with nocturia   . BPH with obstruction/lower urinary tract symptoms 01/04/2017  . CAD (coronary artery disease) 04/02/2013   Cath 2012 moderate mid LAD lesion, otherwise not much disease   . Coronary artery disease    cardiologist-  dr Bettina Gavia (previouly dr Wynonia Lawman)  . Diabetes ([place]) 01/18/2018  . Encounter for long-term (current) use of other medications 07/27/2013  . Essential hypertension, benign   . First degree heart block   . Heart murmur   . History of bilateral knee replacement 02/07/2017  . History of bladder stone   . History of kidney stones   . History of melanoma excision    June 2016  --- s/p  excision right  leg (per pt localized and no recurrence)  . History of renal cell carcinoma 01/2017   s/p  left partial nephrecotmy  . Hypertensive heart disease 04/02/2013  . Impingement syndrome of right shoulder 01/29/2020  . Impotence of organic origin   . Left-sided low back pain with left-sided sciatica 01/23/2019  . Long term current use of anticoagulant therapy   . Mixed hyperlipidemia    slightly  . Multiple renal cysts    bilateral  . Nephrolithiasis 05/17/2014  .  Nonischemic cardiomyopathy ([place])   . Osteoarthritis   . PAF (paroxysmal atrial fibrillation) ([place])    followed by cardiology  (takes ASA)  . RBBB   . 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 Right ureteral stone   . Rotator cuff syndrome of left shoulder 04/17/2016  . Status post total left knee replacement 01/01/2020  . Tendinopathy of right biceps tendon 01/29/2020  . Tendinopathy of right rotator cuff 01/29/2020  . Type 2 diabetes mellitus treated with insulin [facility])    endocrinologist-  dr Loanne Drilling  . Wears glasses     Family History  Problem Relation Age of Onset  . Arthritis Father   . Hypertension Father   . Diabetes  Father   . Arthritis Mother   . Hypertension Mother   . Heart attack Brother 57  . Diabetes Brother   . Lymphoma Paternal Grandfather   . Cancer Maternal Uncle   . Cancer Paternal Uncle        type unknown    Past Surgical History:  Procedure Laterality Date  . CARDIAC CATHETERIZATION  08-17-2011  DR SPENCER TILLEY   POSSIBLE MODERATE LAD STENOSIS JUST AFTER THE BIFURCATION OF LARGE DIAGONAL BRANCH/ LVEF  40-45%/ MILD GLOBAL HYPODINESIS (CATH DONE FOR ABNORMAL STRESS TEST OF FIXED LATERAL WALL DEFECT & BIFASCICULAR BLAOCK)  . CARDIOVERSION N/A 04/02/2013   Procedure: CARDIOVERSION;  Surgeon: Jacolyn Reedy, MD;  Location: [place];  Service: Cardiovascular;  Laterality: N/A;  . CARDIOVERSION N/A 12/27/2020   Procedure: CARDIOVERSION;  Surgeon: Skeet Latch, MD;  Location: [place];  Service: Cardiovascular;  Laterality: N/A;  . CYSTOSCOPY WITH LITHOLAPAXY N/A 05/18/2015   Procedure: CYSTOSCOPY WITH LITHOLAPAXY;  Surgeon: Rana Snare, MD;  Location[facility];  Service: Urology;  Laterality: N/A;  . CYSTOSCOPY WITH LITHOLAPAXY N/A 01/04/2017   Procedure: CYSTOSCOPY WITH LITHOLAPAXY, Holmium Laser;  Surgeon: Ardis Hughs, MD;  Location: [facility];  Service: Urology;  Laterality: N/A;  . CYSTOSCOPY  WITH RETROGRADE PYELOGRAM, URETEROSCOPY AND STENT PLACEMENT  11/03/2012   Procedure: CYSTOSCOPY WITH RETROGRADE PYELOGRAM, URETEROSCOPY AND STENT PLACEMENT;  Surgeon: Bernestine Amass, MD;  Location[facility];  Service: Urology;  Laterality: Left;  Cystoscopy/Left Retrograde/Ureteroscopy/Holmium Laser Litho/Double J Stent  . CYSTOSCOPY WITH URETEROSCOPY, STONE BASKETRY AND STENT PLACEMENT Right 05/15/2019   Procedure: CYSTOSCOPY WITH URETEROSCOPY, LASER LITHOTRIPSY STONE BASKETRY AND STENT PLACEMENT, RIGHT RETROGRADE;  Surgeon: Ardis Hughs, MD;  Location: [facility];  Service: Urology;  Laterality: Right;  . HOLMIUM LASER APPLICATION  69/48/5462   Procedure: HOLMIUM LASER APPLICATION;  Surgeon: Bernestine Amass, MD;  Location: [facility];  Service: Urology;  Laterality: Left;  . HOLMIUM LASER APPLICATION N/A 05/21/5008   Procedure: HOLMIUM LASER APPLICATION;  Surgeon: Rana Snare, MD;  Location: [facility];  Service: Urology;  Laterality: N/A;  . KNEE SURGERY Bilateral [place]  . MELANOMA EXCISION Bilateral June 2016   20th-- left leg //  1st-- right leg w/ skin graft from right thigh (no lymph node bx)  . NEPHROLITHOTOMY Right 05/17/2014   Procedure: NEPHROLITHOTOMY PERCUTANEOUS;  Surgeon: Bernestine Amass, MD;  Location: [facility];  Service: Urology;  Laterality: Right;  . PILONIDAL CYST EXCISION    . ROBOTIC ASSITED PARTIAL NEPHRECTOMY Left 06/18/2013   Procedure: ROBOTIC ASSISTED PARTIAL NEPHRECTOMY;  Surgeon: Dutch Gray, MD;  Location: [facility];  Service: Urology;  Laterality: Left;  . ROTATOR CUFF REPAIR Left 07/2016  . SHOULDER ARTHROSCOPY WITH ROTATOR CUFF REPAIR AND SUBACROMIAL DECOMPRESSION Right 05/27/2020   Procedure: RIGHT SHOULDER ARTHROSCOPY WITH EXTENSIVE DEBRIDEMENT, DISTAL CLAVICLE EXCISION AND SUBACROMIAL DECOMPRESSION;  Surgeon: Leandrew Koyanagi, MD;  Location: [place];  Service: Orthopedics;   Laterality: Right;  . TEE WITH CARDIOVERSION  12/27/2020  . TEE WITHOUT CARDIOVERSION N/A 12/27/2020   Procedure: TRANSESOPHAGEAL ECHOCARDIOGRAM (TEE);  Surgeon: Skeet Latch, MD;  Location: [place];  Service: Cardiovascular;  Laterality: N/A;  . TONSILLECTOMY  26  (age 48)  . TOTAL KNEE ARTHROPLASTY Left 02/07/2017   Procedure: LEFT TOTAL KNEE ARTHROPLASTY;  Surgeon: Leandrew Koyanagi, MD;  Location: [place];  Service: Orthopedics;  Laterality: Left;  . TOTAL KNEE ARTHROPLASTY  Right 04/25/2017   Procedure: RIGHT TOTAL KNEE ARTHROPLASTY;  Surgeon: Leandrew Koyanagi, MD;  Location: [place];  Service: Orthopedics;  Laterality: Right;  . TRANSURETHRAL RESECTION OF PROSTATE N/A 01/04/2017   Procedure: TRANSURETHRAL RESECTION OF THE PROSTATE (TURP);  Surgeon: Ardis Hughs, MD;  Location: [facility];  Service: Urology;  Laterality: N/A;   Social History   Occupational History  . Occupation: Government social research officer, Psychologist, occupational  Tobacco Use  . Smoking status: Former Smoker    Packs/day: 1.00    Years: 6.00    Pack years: 6.00    Types: Cigarettes    Quit date: 10/31/1969    Years since quitting: 51.2  . Smokeless tobacco: Never Used  Vaping Use  . Vaping Use: Never used  Substance and Sexual Activity  . Alcohol use: Yes    Alcohol/week: 0.0 standard drinks    Comment: OCCASIONAL  . Drug use: No  . Sexual activity: Never

## 2021-01-05 NOTE — Patient Instructions (Signed)
Medication Instructions:  Your physician recommends that you continue on your current medications as directed. Please refer to the Current Medication list given to you today.  *If you need a refill on your cardiac medications before your next appointment, please call your pharmacy*   Lab Work: TODAY:  BMET   If you have labs (blood work) drawn today and your tests are completely normal, you will receive your results only by: Marland Kitchen MyChart Message (if you have MyChart) OR . A paper copy in the mail If you have any lab test that is abnormal or we need to change your treatment, we will call you to review the results.   Testing/Procedures: None ordered   Follow-Up: At [facility], you and your health needs are our priority.  As part of our continuing mission to provide you with exceptional heart care, we have created designated Provider Care Teams.  These Care Teams include your primary Cardiologist (physician) and Advanced Practice Providers (APPs -  Physician Assistants and Nurse Practitioners) who all work together to provide you with the care you need, when you need it.  We recommend signing up for the patient portal called "MyChart".  Sign up information is provided on this After Visit Summary.  MyChart is used to connect with patients for Virtual Visits (Telemedicine).  Patients are able to view lab/test results, encounter notes, upcoming appointments, etc.  Non-urgent messages can be sent to your provider as well.   To learn more about what you can do with MyChart, go to [email or web address].    Your next appointment:   3 month(s)  The format for your next appointment:   In Person  Provider:   Candee Furbish, MD   Other Instructions

## 2021-01-06 LAB — BASIC METABOLIC PANEL
BUN/Creatinine Ratio: 12 (ref 10–24)
BUN: 17 mg/dL (ref 8–27)
CO2: 22 mmol/L (ref 20–29)
Calcium: 9.5 mg/dL (ref 8.6–10.2)
Chloride: 108 mmol/L — ABNORMAL HIGH (ref 96–106)
Creatinine, Ser: 1.37 mg/dL — ABNORMAL HIGH (ref 0.76–1.27)
GFR calc Af Amer: 60 mL/min/{1.73_m2} (ref >59)
GFR calc non Af Amer: 52 mL/min/{1.73_m2} — ABNORMAL LOW (ref >59)
Glucose: 119 mg/dL — ABNORMAL HIGH (ref 65–99)
Potassium: 4.1 mmol/L (ref 3.5–5.2)
Sodium: 148 mmol/L — ABNORMAL HIGH (ref 134–144)

## 2021-01-07 DIAGNOSIS — M48061 Spinal stenosis, lumbar region without neurogenic claudication: Secondary | ICD-10-CM | POA: Diagnosis not present

## 2021-01-07 DIAGNOSIS — M4726 Other spondylosis with radiculopathy, lumbar region: Secondary | ICD-10-CM | POA: Diagnosis not present

## 2021-01-07 DIAGNOSIS — M5116 Intervertebral disc disorders with radiculopathy, lumbar region: Secondary | ICD-10-CM | POA: Diagnosis not present

## 2021-01-07 DIAGNOSIS — M5416 Radiculopathy, lumbar region: Secondary | ICD-10-CM | POA: Diagnosis not present

## 2021-01-07 DIAGNOSIS — M5117 Intervertebral disc disorders with radiculopathy, lumbosacral region: Secondary | ICD-10-CM | POA: Diagnosis not present

## 2021-01-09 ENCOUNTER — Telehealth (HOSPITAL_COMMUNITY): Payer: Self-pay

## 2021-01-09 NOTE — Telephone Encounter (Signed)
Pharmacy Transitions of Care Follow-up Telephone Call  Date of discharge: 12/29/20 Discharge Diagnosis: Atrial flutter  How have you been since you were released from the hospital? Patient has been well. Already had follow ups and knows s/sx of bleeding to look out for.   Medication changes made at discharge: yes  Medication changes obtained and verified? yes    Medication Accessibility:  Home Pharmacy: [facility], PCP already sent refills into pharmacy   . Is the patient able to afford medications? yes    Medication Review:  APIXABEN (ELIQUIS)  Apixaban 5 mg BID initiated on 12/29/20 - Discussed importance of taking medication around the same time everyday  - Advised patient of medications to avoid (NSAIDs, ASA)  - Educated that Tylenol (acetaminophen) will be the preferred analgesic to prevent risk of bleeding  - Emphasized importance of monitoring for signs and symptoms of bleeding (abnormal bruising, prolonged bleeding, nose bleeds, bleeding from gums, discolored urine, black tarry stools)  - Advised patient to alert all providers of anticoagulation therapy prior to starting a new medication or having a procedure     Follow-up Appointments:  Saw Dr. Curly Shores in Cardiology for follow up on 01/05/21 and more scheduled in the coming months for additional follow up.  If their condition worsens, is the pt aware to call PCP or go to the Emergency Dept.? yes  Final Patient Assessment: Patient is doing well. Has refills and follow ups scheduled.

## 2021-01-10 ENCOUNTER — Ambulatory Visit: Payer: BC Managed Care – PPO | Admitting: Cardiology

## 2021-01-19 DIAGNOSIS — Z86018 Personal history of other benign neoplasm: Secondary | ICD-10-CM | POA: Diagnosis not present

## 2021-01-19 DIAGNOSIS — D485 Neoplasm of uncertain behavior of skin: Secondary | ICD-10-CM | POA: Diagnosis not present

## 2021-01-19 DIAGNOSIS — L814 Other melanin hyperpigmentation: Secondary | ICD-10-CM | POA: Diagnosis not present

## 2021-01-19 DIAGNOSIS — Z85820 Personal history of malignant melanoma of skin: Secondary | ICD-10-CM | POA: Diagnosis not present

## 2021-01-19 DIAGNOSIS — D225 Melanocytic nevi of trunk: Secondary | ICD-10-CM | POA: Diagnosis not present

## 2021-01-19 DIAGNOSIS — L820 Inflamed seborrheic keratosis: Secondary | ICD-10-CM | POA: Diagnosis not present

## 2021-01-19 DIAGNOSIS — L821 Other seborrheic keratosis: Secondary | ICD-10-CM | POA: Diagnosis not present

## 2021-01-20 ENCOUNTER — Ambulatory Visit: Payer: BC Managed Care – PPO | Admitting: Cardiology

## 2021-03-24 ENCOUNTER — Other Ambulatory Visit: Payer: Self-pay

## 2021-03-24 ENCOUNTER — Encounter: Payer: Self-pay | Admitting: Cardiology

## 2021-03-24 ENCOUNTER — Ambulatory Visit: Payer: BC Managed Care – PPO | Admitting: Endocrinology

## 2021-03-24 ENCOUNTER — Ambulatory Visit: Payer: BC Managed Care – PPO | Admitting: Cardiology

## 2021-03-24 VITALS — BP 132/64 | HR 58 | Ht 71.0 in | Wt 228.2 lb

## 2021-03-24 VITALS — BP 164/76 | HR 60 | Ht 71.0 in | Wt 228.0 lb

## 2021-03-24 DIAGNOSIS — Z794 Long term (current) use of insulin: Secondary | ICD-10-CM

## 2021-03-24 DIAGNOSIS — I110 Hypertensive heart disease with heart failure: Secondary | ICD-10-CM | POA: Diagnosis not present

## 2021-03-24 DIAGNOSIS — I5042 Chronic combined systolic (congestive) and diastolic (congestive) heart failure: Secondary | ICD-10-CM | POA: Diagnosis not present

## 2021-03-24 DIAGNOSIS — I480 Paroxysmal atrial fibrillation: Secondary | ICD-10-CM

## 2021-03-24 DIAGNOSIS — Z79899 Other long term (current) drug therapy: Secondary | ICD-10-CM | POA: Diagnosis not present

## 2021-03-24 DIAGNOSIS — E1159 Type 2 diabetes mellitus with other circulatory complications: Secondary | ICD-10-CM

## 2021-03-24 LAB — CBC
Hematocrit: 51.1 % — ABNORMAL HIGH (ref 37.5–51.0)
Hemoglobin: 16.7 g/dL (ref 13.0–17.7)
MCH: 27.7 pg (ref 26.6–33.0)
MCHC: 32.7 g/dL (ref 31.5–35.7)
MCV: 85 fL (ref 79–97)
Platelets: 172 10*3/uL (ref 150–450)
RBC: 6.03 x10E6/uL — ABNORMAL HIGH (ref 4.14–5.80)
RDW: 13.2 % (ref 11.6–15.4)
WBC: 5.7 10*3/uL (ref 3.4–10.8)

## 2021-03-24 LAB — COMPREHENSIVE METABOLIC PANEL
ALT: 19 IU/L (ref 0–44)
AST: 19 IU/L (ref 0–40)
Albumin/Globulin Ratio: 2.3 — ABNORMAL HIGH (ref 1.2–2.2)
Albumin: 4.4 g/dL (ref 3.8–4.8)
Alkaline Phosphatase: 64 IU/L (ref 44–121)
BUN/Creatinine Ratio: 13 (ref 10–24)
BUN: 16 mg/dL (ref 8–27)
Bilirubin Total: 0.7 mg/dL (ref 0.0–1.2)
CO2: 22 mmol/L (ref 20–29)
Calcium: 9.5 mg/dL (ref 8.6–10.2)
Chloride: 105 mmol/L (ref 96–106)
Creatinine, Ser: 1.24 mg/dL (ref 0.76–1.27)
Globulin, Total: 1.9 g/dL (ref 1.5–4.5)
Glucose: 93 mg/dL (ref 65–99)
Potassium: 4.1 mmol/L (ref 3.5–5.2)
Sodium: 142 mmol/L (ref 134–144)
Total Protein: 6.3 g/dL (ref 6.0–8.5)
eGFR: 63 mL/min/{1.73_m2} (ref >59)

## 2021-03-24 LAB — TSH: TSH: 0.996 u[IU]/mL (ref 0.450–4.500)

## 2021-03-24 LAB — POCT GLYCOSYLATED HEMOGLOBIN (HGB A1C): Hemoglobin A1C: 6.4 % — AB (ref 4.0–5.6)

## 2021-03-24 LAB — T4, FREE: Free T4: 1.34 ng/dL (ref 0.82–1.77)

## 2021-03-24 MED ORDER — TRULICITY 4.5 MG/0.5ML ~~LOC~~ SOAJ: 4.5000 mg | SUBCUTANEOUS | 3 refills | Status: DC

## 2021-03-24 MED ORDER — BASAGLAR KWIKPEN 100 UNIT/ML ~~LOC~~ SOPN: 60.0000 [IU] | PEN_INJECTOR | SUBCUTANEOUS | 3 refills | Status: DC

## 2021-03-24 NOTE — Progress Notes (Signed)
Subjective:    Patient ID: Matthew Schmitt, male    DOB: 05/07/1951, 70 y.o.   MRN: [id number]  HPI Pt returns for f/u of diabetes mellitus:  DM type: Insulin-requiring type 2.  Dx'ed: 2002.   Complications: renal insuff, leg ulcers, and CAD.   Therapy: insulin since [id number] and Trulicity.   DKA: never.   Severe hypoglycemia: once, in 2017.  Pancreatitis: never.   Other: he declines multiple daily injections;.  In early 2017, he requested to change levemir to lantus, due to high dosage requirement at the time; fructosamine has showed better glycemic control than A1c.  Interval history: He takes 70 units qam. Pt says cbg's vary from 105-165.  pt states he feels well in general. Past Medical History:  Diagnosis Date  . Anxiety   . Arthritis    everywhere  . Arthrosis of right acromioclavicular joint 01/29/2020  . Atrial flutter [facility])    Onset May 2014 Cardioversion done   . BPH associated with nocturia   . BPH with obstruction/lower urinary tract symptoms 01/04/2017  . CAD (coronary artery disease) 04/02/2013   Cath 2012 moderate mid LAD lesion, otherwise not much disease   . Coronary artery disease    cardiologist-  dr Bettina Gavia (previouly dr Wynonia Lawman)  . Diabetes ([place]) 01/18/2018  . Encounter for long-term (current) use of other medications 07/27/2013  . Essential hypertension, benign   . First degree heart block   . Heart murmur   . History of bilateral knee replacement 02/07/2017  . History of bladder stone   . History of kidney stones   . History of melanoma excision    June 2016  --- s/p  excision right  leg (per pt localized and no recurrence)  . History of renal cell carcinoma 01/2017   s/p  left partial nephrecotmy  . Hypertensive heart disease 04/02/2013  . Impingement syndrome of right shoulder 01/29/2020  . Impotence of organic origin   . Left-sided low back pain with left-sided sciatica 01/23/2019  . Long term current use of anticoagulant therapy   . Mixed hyperlipidemia     slightly  . Multiple renal cysts    bilateral  . Nephrolithiasis 05/17/2014  . Nonischemic cardiomyopathy ([place])   . Osteoarthritis   . PAF (paroxysmal atrial fibrillation) ([place])    followed by cardiology  (takes ASA)  . RBBB   . 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 Right ureteral stone   . Rotator cuff syndrome of left shoulder 04/17/2016  . Status post total left knee replacement 01/01/2020  . Tendinopathy of right biceps tendon 01/29/2020  . Tendinopathy of right rotator cuff 01/29/2020  . Type 2 diabetes mellitus treated with insulin [facility])    endocrinologist-  dr Loanne Drilling  . Wears glasses     Past Surgical History:  Procedure Laterality Date  . CARDIAC CATHETERIZATION  08-17-2011  DR SPENCER TILLEY   POSSIBLE MODERATE LAD STENOSIS JUST AFTER THE BIFURCATION OF LARGE DIAGONAL BRANCH/ LVEF  40-45%/ MILD GLOBAL HYPODINESIS (CATH DONE FOR ABNORMAL STRESS TEST OF FIXED LATERAL WALL DEFECT & BIFASCICULAR BLAOCK)  . CARDIOVERSION N/A 04/02/2013   Procedure: CARDIOVERSION;  Surgeon: Jacolyn Reedy, MD;  Location: [place];  Service: Cardiovascular;  Laterality: N/A;  . CARDIOVERSION N/A 12/27/2020   Procedure: CARDIOVERSION;  Surgeon: Skeet Latch, MD;  Location: [place];  Service: Cardiovascular;  Laterality: N/A;  . CYSTOSCOPY WITH LITHOLAPAXY N/A 05/18/2015   Procedure: CYSTOSCOPY WITH LITHOLAPAXY;  Surgeon:  Rana Snare, MD;  Location[facility];  Service: Urology;  Laterality: N/A;  . CYSTOSCOPY WITH LITHOLAPAXY N/A 01/04/2017   Procedure: CYSTOSCOPY WITH LITHOLAPAXY, Holmium Laser;  Surgeon: Ardis Hughs, MD;  Location: [facility];  Service: Urology;  Laterality: N/A;  . CYSTOSCOPY WITH RETROGRADE PYELOGRAM, URETEROSCOPY AND STENT PLACEMENT  11/03/2012   Procedure: CYSTOSCOPY WITH RETROGRADE PYELOGRAM, URETEROSCOPY AND STENT PLACEMENT;  Surgeon: Bernestine Amass, MD;  Location[facility];  Service: Urology;   Laterality: Left;  Cystoscopy/Left Retrograde/Ureteroscopy/Holmium Laser Litho/Double J Stent  . CYSTOSCOPY WITH URETEROSCOPY, STONE BASKETRY AND STENT PLACEMENT Right 05/15/2019   Procedure: CYSTOSCOPY WITH URETEROSCOPY, LASER LITHOTRIPSY STONE BASKETRY AND STENT PLACEMENT, RIGHT RETROGRADE;  Surgeon: Ardis Hughs, MD;  Location: [facility];  Service: Urology;  Laterality: Right;  . HOLMIUM LASER APPLICATION  32/99/2426   Procedure: HOLMIUM LASER APPLICATION;  Surgeon: Bernestine Amass, MD;  Location[facility];  Service: Urology;  Laterality: Left;  . HOLMIUM LASER APPLICATION N/A 8/34/1962   Procedure: HOLMIUM LASER APPLICATION;  Surgeon: Rana Snare, MD;  Location: [facility];  Service: Urology;  Laterality: N/A;  . KNEE SURGERY Bilateral [place]  . MELANOMA EXCISION Bilateral June 2016   20th-- left leg //  1st-- right leg w/ skin graft from right thigh (no lymph node bx)  . NEPHROLITHOTOMY Right 05/17/2014   Procedure: NEPHROLITHOTOMY PERCUTANEOUS;  Surgeon: Bernestine Amass, MD;  Location: [facility];  Service: Urology;  Laterality: Right;  . PILONIDAL CYST EXCISION    . ROBOTIC ASSITED PARTIAL NEPHRECTOMY Left 06/18/2013   Procedure: ROBOTIC ASSISTED PARTIAL NEPHRECTOMY;  Surgeon: Dutch Gray, MD;  Location: [facility];  Service: Urology;  Laterality: Left;  . ROTATOR CUFF REPAIR Left 07/2016  . SHOULDER ARTHROSCOPY WITH ROTATOR CUFF REPAIR AND SUBACROMIAL DECOMPRESSION Right 05/27/2020   Procedure: RIGHT SHOULDER ARTHROSCOPY WITH EXTENSIVE DEBRIDEMENT, DISTAL CLAVICLE EXCISION AND SUBACROMIAL DECOMPRESSION;  Surgeon: Leandrew Koyanagi, MD;  Location: [place];  Service: Orthopedics;  Laterality: Right;  . TEE WITH CARDIOVERSION  12/27/2020  . TEE WITHOUT CARDIOVERSION N/A 12/27/2020   Procedure: TRANSESOPHAGEAL ECHOCARDIOGRAM (TEE);  Surgeon: Skeet Latch, MD;  Location: [place];  Service: Cardiovascular;  Laterality:  N/A;  . TONSILLECTOMY  60  (age 51)  . TOTAL KNEE ARTHROPLASTY Left 02/07/2017   Procedure: LEFT TOTAL KNEE ARTHROPLASTY;  Surgeon: Leandrew Koyanagi, MD;  Location: [place];  Service: Orthopedics;  Laterality: Left;  . TOTAL KNEE ARTHROPLASTY Right 04/25/2017   Procedure: RIGHT TOTAL KNEE ARTHROPLASTY;  Surgeon: Leandrew Koyanagi, MD;  Location: [place];  Service: Orthopedics;  Laterality: Right;  . TRANSURETHRAL RESECTION OF PROSTATE N/A 01/04/2017   Procedure: TRANSURETHRAL RESECTION OF THE PROSTATE (TURP);  Surgeon: Ardis Hughs, MD;  Location[facility];  Service: Urology;  Laterality: N/A;    Social History   Socioeconomic History  . Marital status: Married    Spouse name: Not on file  . Number of children: 2  . Years of education: 33  . Highest education level: Not on file  Occupational History  . Occupation: Government social research officer, Psychologist, occupational  Tobacco Use  . Smoking status: Former Smoker    Packs/day: 1.00    Years: 6.00    Pack years: 6.00    Types: Cigarettes    Quit date: 10/31/1969    Years since quitting: 51.4  . Smokeless tobacco: Never Used  Vaping Use  . Vaping Use: Never  used  Substance and Sexual Activity  . Alcohol use: Yes    Alcohol/week: 0.0 standard drinks    Comment: OCCASIONAL  . Drug use: No  . Sexual activity: Never  Other Topics Concern  . Not on file  Social History Narrative   Regular exercise-no   Social Determinants of Health   Financial Resource Strain: Not on file  Food Insecurity: Not on file  Transportation Needs: Not on file  Physical Activity: Not on file  Stress: Not on file  Social Connections: Not on file  Intimate Partner Violence: Not on file    Current Outpatient Medications on File Prior to Visit  Medication Sig Dispense Refill  . amiodarone (PACERONE) 200 MG tablet Take 200mg  (1 tablet) twice daily for 2 weeks. Then on 01/12/2021, reduce dose to 200mg  once daily. 45 tablet 0  . apixaban (ELIQUIS) 5 MG TABS tablet Take 1 tablet  (5 mg total) by mouth 2 (two) times daily. 60 tablet 0  . dapagliflozin propanediol (FARXIGA) 10 MG TABS tablet Take 1 tablet (10 mg total) by mouth daily before breakfast. 30 tablet 0  . dapagliflozin propanediol (FARXIGA) 10 MG TABS tablet TAKE 1 TABLET (10 MG TOTAL) BY MOUTH DAILY BEFORE BREAKFAST. 30 tablet 0  . glucose blood (ONETOUCH ULTRA) test strip 1 each by Other route 2 (two) times daily. E11.9 200 strip 3  . Insulin Pen Needle (PEN NEEDLES) 30G X 8 MM MISC 1 each by Does not apply route daily. E11.9 90 each 0  . Lancets (ONETOUCH DELICA PLUS [id number]) MISC 1 each by Other route 2 (two) times daily. E11.9 200 each 3  . metoprolol succinate (TOPROL-XL) 50 MG 24 hr tablet TAKE 1 TABLET (50 MG TOTAL) BY MOUTH DAILY. TAKE WITH OR IMMEDIATELY FOLLOWING A MEAL. (Patient taking differently: Take by mouth daily. Take with or immediately following a meal.) 30 tablet 0  . rosuvastatin (CRESTOR) 20 MG tablet Take 1 tablet (20 mg total) by mouth daily. 90 tablet 3  . sacubitril-valsartan (ENTRESTO) 49-51 MG TAKE 1 TABLET BY MOUTH TWO TIMES DAILY. 60 tablet 0  . spironolactone (ALDACTONE) 25 MG tablet TAKE 1/2 TABLET (12.5 MG TOTAL) BY MOUTH DAILY. 15 tablet 0  . trolamine salicylate (ASPERCREME) 10 % cream Apply 1 application topically as needed for muscle pain.    . nitroGLYCERIN (NITROSTAT) 0.4 MG SL tablet Place 1 tablet (0.4 mg total) under the tongue every 5 (five) minutes as needed for chest pain. 30 tablet 3   No current facility-administered medications on file prior to visit.    Allergies  Allergen Reactions  . Actos [Pioglitazone] Swelling and Cough    SWELLING REACTION UNSPECIFIED  EDEMA    Family History  Problem Relation Age of Onset  . Arthritis Father   . Hypertension Father   . Diabetes Father   . Arthritis Mother   . Hypertension Mother   . Heart attack Brother 90  . Diabetes Brother   . Lymphoma Paternal Grandfather   . Cancer Maternal Uncle   . Cancer Paternal  Uncle        type unknown    BP (!) 164/76 (BP Location: Right Arm, Patient Position: Sitting, Cuff Size: Large)   Pulse 60   Ht 5\' 11"  (1.803 m)   Wt 228 lb (103.4 kg)   SpO2 98%   BMI 31.80 kg/m    Review of Systems He denies hypoglycemia    Objective:   Physical Exam VITAL SIGNS:  See vs page GENERAL:  no distress CV: tachycardic reg rhythm.   Pulses: dorsalis pedis intact bilat.   MSK: no deformity of the feet.   CV: no leg edema.   Skin:  no ulcer on the feet.  normal color and temp on the feet, but there is severe hyperpigmentation of the legs.   Neuro: sensation is intact to touch on the feet.   Ext: there is bilateral onychomycosis of the toenails.  Both great toenails are absent.    Lab Results  Component Value Date   HGBA1C 6.4 (A) 03/24/2021      Assessment & Plan:  Insulin-requiring type 2 DM: overcontrolled.  Patient Instructions  Your blood pressure is high today.  Please see your primary care provider soon, to have it rechecked.   Please reduce the insulin to 60 units each morning, and: Please continue the same other diabetes medications.   check your blood sugar twice a day.  vary the time of day when you check, between before the 3 meals, and at bedtime.  also check if you have symptoms of your blood sugar being too high or too low.  please keep a record of the readings and bring it to your next appointment here (or you can bring the meter itself).  You can write it on any piece of paper.  please call us sooner if your blood sugar goes below 70, or if you have a lot of readings over 200.   Please come back for a follow-up appointment in 4 months.

## 2021-03-24 NOTE — Progress Notes (Signed)
Cardiology Office Note:    Date:  03/24/2021   ID:  Matthew Schmitt, DOB Apr 24, 1951, MRN [id number]  PCP:  Ailene Ravel, MD   [facility] Providers Cardiologist:  Donato Schultz, MD     Referring MD: Ailene Ravel, MD    History of Present Illness:    Matthew Schmitt is a 70 y.o. male here for CHF, coronary artery disease, and atrial fibrillation.  He was seen in the ED/hospitalization on 12/26/20 for atrial flutter.  Had previously had DC cardioversion.  He was found to be in 2-1 atrial flutter with heart rates in the 160s tried beta-blocker.  In the ER his heart rates were in the 130s.  He was given IV amiodarone his echo showed a reduction in ejection fraction of 20 to 25%.  We went ahead and did a TEE cardioversion and he is maintaining sinus rhythm.  P.o. amiodarone.  Eliquis.  With his conduction disease, right bundle branch block left anterior fascicular block first-degree AV block we will watch for signs of worsening conduction.  With his EF reduction to we diuresed.  He was switched to [facility] in the hospital stay as well as Toprol.  Farxiga 10 mg also.  Spironolactone 12.5.  We will repeat echocardiogram since it has been 3 months.  Baseline creatinine has been approximately 1.4-1.5.  Today, he is doing better since his hospital visit.He is taking 5 mg  Eliquis PO BID and reports bruising easily. However, it is nothing he is concerned about. His DM is being followed by ENDO. He denies any exertional shortness of breath, chest pain, tightness, or pressure. He has no lightheadedness, PND, orthopnea, or headaches. He denies any presyncopal or syncopal episodes.   Past Medical History:  Diagnosis Date  . Anxiety   . Arthritis    everywhere  . Arthrosis of right acromioclavicular joint 01/29/2020  . Atrial flutter [facility])    Onset May 2014 Cardioversion done   . BPH associated with nocturia   . BPH with obstruction/lower urinary tract symptoms 01/04/2017  . CAD (coronary  artery disease) 04/02/2013   Cath 2012 moderate mid LAD lesion, otherwise not much disease   . Coronary artery disease    cardiologist-  dr Dulce Sellar (previouly dr Donnie Aho)  . Diabetes (HCC) 01/18/2018  . Encounter for long-term (current) use of other medications 07/27/2013  . Essential hypertension, benign   . First degree heart block   . Heart murmur   . History of bilateral knee replacement 02/07/2017  . History of bladder stone   . History of kidney stones   . History of melanoma excision    June 2016  --- s/p  excision right  leg (per pt localized and no recurrence)  . History of renal cell carcinoma 01/2017   s/p  left partial nephrecotmy  . Hypertensive heart disease 04/02/2013  . Impingement syndrome of right shoulder 01/29/2020  . Impotence of organic origin   . Left-sided low back pain with left-sided sciatica 01/23/2019  . Long term current use of anticoagulant therapy   . Mixed hyperlipidemia    slightly  . Multiple renal cysts    bilateral  . Nephrolithiasis 05/17/2014  . Nonischemic cardiomyopathy (HCC)   . Osteoarthritis   . PAF (paroxysmal atrial fibrillation) (HCC)    followed by cardiology  (takes ASA)  . RBBB   . 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 Right ureteral stone   . Rotator cuff syndrome  of left shoulder 04/17/2016  . Status post total left knee replacement 01/01/2020  . Tendinopathy of right biceps tendon 01/29/2020  . Tendinopathy of right rotator cuff 01/29/2020  . Type 2 diabetes mellitus treated with insulin [facility])    endocrinologist-  dr Loanne Drilling  . Wears glasses     Past Surgical History:  Procedure Laterality Date  . CARDIAC CATHETERIZATION  08-17-2011  DR SPENCER TILLEY   POSSIBLE MODERATE LAD STENOSIS JUST AFTER THE BIFURCATION OF LARGE DIAGONAL BRANCH/ LVEF  40-45%/ MILD GLOBAL HYPODINESIS (CATH DONE FOR ABNORMAL STRESS TEST OF FIXED LATERAL WALL DEFECT & BIFASCICULAR BLAOCK)  . CARDIOVERSION N/A 04/02/2013    Procedure: CARDIOVERSION;  Surgeon: Jacolyn Reedy, MD;  Location: [place];  Service: Cardiovascular;  Laterality: N/A;  . CARDIOVERSION N/A 12/27/2020   Procedure: CARDIOVERSION;  Surgeon: Skeet Latch, MD;  Location: [place];  Service: Cardiovascular;  Laterality: N/A;  . CYSTOSCOPY WITH LITHOLAPAXY N/A 05/18/2015   Procedure: CYSTOSCOPY WITH LITHOLAPAXY;  Surgeon: Rana Snare, MD;  Location: [facility];  Service: Urology;  Laterality: N/A;  . CYSTOSCOPY WITH LITHOLAPAXY N/A 01/04/2017   Procedure: CYSTOSCOPY WITH LITHOLAPAXY, Holmium Laser;  Surgeon: Ardis Hughs, MD;  Location: [facility];  Service: Urology;  Laterality: N/A;  . CYSTOSCOPY WITH RETROGRADE PYELOGRAM, URETEROSCOPY AND STENT PLACEMENT  11/03/2012   Procedure: CYSTOSCOPY WITH RETROGRADE PYELOGRAM, URETEROSCOPY AND STENT PLACEMENT;  Surgeon: Bernestine Amass, MD;  Location[facility];  Service: Urology;  Laterality: Left;  Cystoscopy/Left Retrograde/Ureteroscopy/Holmium Laser Litho/Double J Stent  . CYSTOSCOPY WITH URETEROSCOPY, STONE BASKETRY AND STENT PLACEMENT Right 05/15/2019   Procedure: CYSTOSCOPY WITH URETEROSCOPY, LASER LITHOTRIPSY STONE BASKETRY AND STENT PLACEMENT, RIGHT RETROGRADE;  Surgeon: Ardis Hughs, MD;  Location: [facility];  Service: Urology;  Laterality: Right;  . HOLMIUM LASER APPLICATION  [id number]   Procedure: HOLMIUM LASER APPLICATION;  Surgeon: Bernestine Amass, MD;  Location: [facility];  Service: Urology;  Laterality: Left;  . HOLMIUM LASER APPLICATION N/A 0000000   Procedure: HOLMIUM LASER APPLICATION;  Surgeon: Rana Snare, MD;  Location: [facility];  Service: Urology;  Laterality: N/A;  . KNEE SURGERY Bilateral Matthew Schmitt  . MELANOMA EXCISION Bilateral June 2016   20th-- left leg //  1st-- right leg w/ skin graft from right thigh (no lymph node bx)  . NEPHROLITHOTOMY Right 05/17/2014   Procedure:  NEPHROLITHOTOMY PERCUTANEOUS;  Surgeon: Bernestine Amass, MD;  Location: [facility];  Service: Urology;  Laterality: Right;  . PILONIDAL CYST EXCISION    . ROBOTIC ASSITED PARTIAL NEPHRECTOMY Left 06/18/2013   Procedure: ROBOTIC ASSISTED PARTIAL NEPHRECTOMY;  Surgeon: Dutch Gray, MD;  Location: [facility];  Service: Urology;  Laterality: Left;  . ROTATOR CUFF REPAIR Left 07/2016  . SHOULDER ARTHROSCOPY WITH ROTATOR CUFF REPAIR AND SUBACROMIAL DECOMPRESSION Right 05/27/2020   Procedure: RIGHT SHOULDER ARTHROSCOPY WITH EXTENSIVE DEBRIDEMENT, DISTAL CLAVICLE EXCISION AND SUBACROMIAL DECOMPRESSION;  Surgeon: Leandrew Koyanagi, MD;  Location: [place];  Service: Orthopedics;  Laterality: Right;  . TEE WITH CARDIOVERSION  12/27/2020  . TEE WITHOUT CARDIOVERSION N/A 12/27/2020   Procedure: TRANSESOPHAGEAL ECHOCARDIOGRAM (TEE);  Surgeon: Skeet Latch, MD;  Location: [place];  Service: Cardiovascular;  Laterality: N/A;  . TONSILLECTOMY  30  (age 68)  . TOTAL KNEE ARTHROPLASTY Left 02/07/2017   Procedure: LEFT TOTAL KNEE ARTHROPLASTY;  Surgeon: Leandrew Koyanagi, MD;  Location: [place];  Service: Orthopedics;  Laterality: Left;  . TOTAL  KNEE ARTHROPLASTY Right 04/25/2017   Procedure: RIGHT TOTAL KNEE ARTHROPLASTY;  Surgeon: Leandrew Koyanagi, MD;  Location: [place];  Service: Orthopedics;  Laterality: Right;  . TRANSURETHRAL RESECTION OF PROSTATE N/A 01/04/2017   Procedure: TRANSURETHRAL RESECTION OF THE PROSTATE (TURP);  Surgeon: Ardis Hughs, MD;  Location[facility];  Service: Urology;  Laterality: N/A;    Current Medications: Current Meds  Medication Sig  . amiodarone (PACERONE) 200 MG tablet Take 200mg  (1 tablet) twice daily for 2 weeks. Then on 01/12/2021, reduce dose to 200mg  once daily.  Marland Kitchen apixaban (ELIQUIS) 5 MG TABS tablet Take 1 tablet (5 mg total) by mouth 2 (two) times daily.  . dapagliflozin propanediol (FARXIGA) 10 MG TABS tablet Take 1 tablet (10 mg total) by mouth daily before breakfast.   . dapagliflozin propanediol (FARXIGA) 10 MG TABS tablet TAKE 1 TABLET (10 MG TOTAL) BY MOUTH DAILY BEFORE BREAKFAST.  . Dulaglutide (TRULICITY) 4.5 0000000 SOPN Inject 4.5 mg into the skin once a week.  Marland Kitchen glucose blood (ONETOUCH ULTRA) test strip 1 each by Other route 2 (two) times daily. E11.9  . Insulin Glargine (BASAGLAR KWIKPEN) 100 UNIT/ML Inject 60 Units into the skin every morning. And pen needles 1/day  . Insulin Pen Needle (PEN NEEDLES) 30G X 8 MM MISC 1 each by Does not apply route daily. E11.9  . Lancets (ONETOUCH DELICA PLUS 123XX123) MISC 1 each by Other route 2 (two) times daily. E11.9  . metoprolol succinate (TOPROL-XL) 50 MG 24 hr tablet TAKE 1 TABLET (50 MG TOTAL) BY MOUTH DAILY. TAKE WITH OR IMMEDIATELY FOLLOWING A MEAL. (Patient taking differently: Take by mouth daily. Take with or immediately following a meal.)  . nitroGLYCERIN (NITROSTAT) 0.4 MG SL tablet Place 1 tablet (0.4 mg total) under the tongue every 5 (five) minutes as needed for chest pain.  . rosuvastatin (CRESTOR) 20 MG tablet Take 1 tablet (20 mg total) by mouth daily.  . sacubitril-valsartan (ENTRESTO) 49-51 MG TAKE 1 TABLET BY MOUTH TWO TIMES DAILY.  Marland Kitchen spironolactone (ALDACTONE) 25 MG tablet TAKE 1/2 TABLET (12.5 MG TOTAL) BY MOUTH DAILY.  Marland Kitchen trolamine salicylate (ASPERCREME) 10 % cream Apply 1 application topically as needed for muscle pain.     Allergies:   Actos [pioglitazone]   Social History   Socioeconomic History  . Marital status: Married    Spouse name: Not on file  . Number of children: 2  . Years of education: 44  . Highest education level: Not on file  Occupational History  . Occupation: Government social research officer, Psychologist, occupational  Tobacco Use  . Smoking status: Former Smoker    Packs/day: 1.00    Years: 6.00    Pack years: 6.00    Types: Cigarettes    Quit date: 10/31/1969    Years since quitting: 51.4  . Smokeless tobacco: Never Used  Vaping Use  . Vaping Use: Never used  Substance and  Sexual Activity  . Alcohol use: Yes    Alcohol/week: 0.0 standard drinks    Comment: OCCASIONAL  . Drug use: No  . Sexual activity: Never  Other Topics Concern  . Not on file  Social History Narrative   Regular exercise-no   Social Determinants of Health   Financial Resource Strain: Not on file  Food Insecurity: Not on file  Transportation Needs: Not on file  Physical Activity: Not on file  Stress: Not on file  Social Connections: Not on file     Family History: The patient's family history includes  Arthritis in his father and mother; Cancer in his maternal uncle and paternal uncle; Diabetes in his brother and father; Heart attack (age of onset: 73) in his brother; Hypertension in his father and mother; Lymphoma in his paternal grandfather.  ROS:   Please see the history of present illness.  All other systems reviewed and are negative.  EKGs/Labs/Other Studies Reviewed:    The following studies were reviewed today: ECHO(12/27/20)- 1. Severe global reduction in LV systolic function; probable fusion of right and left coronary cusps resulting in functionally bicuspid aortic valve; moderate AS (mean gradient 12 mmHg; AVA1 cm2); mild AI. 2. Left ventricular ejection fraction, by estimation, is 20 to 25%. The left ventricle has severely decreased function. The left ventricle demonstrates global hypokinesis. The left ventricular internal cavity size was moderately dilated. There is mild left ventricular hypertrophy. Left ventricular diastolic parameters are consistent with Grade III diastolic dysfunction (restrictive). Elevated left atrial pressure. 3. Right ventricular systolic function is normal. The right ventricular size is normal. 4. Left atrial size was severely dilated. 5. The mitral valve is normal in structure. Mild mitral valve regurgitation. No evidence of mitral stenosis. 6. The aortic valve is bicuspid. Aortic valve regurgitation is mild. Moderate aortic  valve stenosis. 7. Aortic dilatation noted. There is mild dilatation of the aortic root, measuring 40 mm. There is mild dilatation of the ascending aorta, measuring 41 mm. 8. The inferior vena cava is dilated in size with <50% respiratory variability, suggesting right atrial pressure of 15 mmHg.  TEE(12/27/20)- 1. Left ventricular ejection fraction, by estimation, is <20%. The left ventricle has severely decreased function. The left ventricle demonstrates global hypokinesis. The left ventricular internal cavity size was mildly to moderately dilated. 2. Right ventricular systolic function is moderately reduced. The right ventricular size is normal. 3. No left atrial/left atrial appendage thrombus was detected. The LAA emptying velocity was 38 cm/s. 4. Multiple mild regurgitant jets that collectively cause moderate mitral regurgitation. The mitral valve is normal in structure. Moderate mitral valve regurgitation. No evidence of mitral stenosis. 5. There is fusion and calcification of the left and right coronary cusps. The aortic valve is bicuspid. Aortic valve regurgitation is not visualized. No aortic stenosis is present. 6. Aortic dilatation noted. There is mild dilatation of the ascending aorta, measuring 41 mm. Conclusion(s)/Recommendation(s): Normal biventricular function without evidence of hemodynamically significant valvular heart disease.  EKG:   03/24/21-EKG was not ordered today.  Recent Labs: 12/19/2020: ALT 41; NT-Pro BNP 3,563 12/26/2020: B Natriuretic Peptide 412.0; TSH 2.112 12/29/2020: Hemoglobin 14.0; Platelets 98 01/05/2021: BUN 17; Creatinine, Ser 1.37; Potassium 4.1; Sodium 148  Recent Lipid Panel    Component Value Date/Time   CHOL 149 12/19/2020 1656   TRIG 165 (H) 12/19/2020 1656   HDL 42 12/19/2020 1656   CHOLHDL 3.5 12/19/2020 1656   CHOLHDL 4.2 07/20/2014 0822   VLDL 26 07/20/2014 0822   LDLCALC 79 12/19/2020 1656     Risk Assessment/Calculations:       Physical Exam:    VS:  BP 132/64   Pulse (!) 58   Ht 5\' 11"  (1.803 m)   Wt 228 lb 3.2 oz (103.5 kg)   SpO2 96%   BMI 31.83 kg/m     Wt Readings from Last 3 Encounters:  03/24/21 228 lb 3.2 oz (103.5 kg)  03/24/21 228 lb (103.4 kg)  01/05/21 226 lb (102.5 kg)     GEN: Well nourished, well developed in no acute distress HEENT: Normal NECK: No JVD; No  carotid bruits LYMPHATICS: No lymphadenopathy CARDIAC: RRR, 2/6 systolic aortic murmur,no rubs, gallops RESPIRATORY:  Clear to auscultation without rales, wheezing or rhonchi  ABDOMEN: Soft, non-tender, non-distended MUSCULOSKELETAL:  No edema; No deformity  SKIN: Warm and dry NEUROLOGIC:  Alert and oriented x 3 PSYCHIATRIC:  Normal affect   ASSESSMENT:    1. Chronic combined systolic and diastolic CHF (congestive heart failure) ([place])   2. PAF (paroxysmal atrial fibrillation) ([place])   3. Hypertensive heart disease with heart failure ([place])   4. Medication management    PLAN:    In order of problems listed above:  Paroxysmal atrial flutter - TEE cardioversion, reduction in ejection fraction.  Amiodarone.  Beta-blocker.  Eliquis.  Easy bruising at times.  Tolerating well.  We are going to check lab work, TSH, free T4, CBC, complete metabolic profile.  Right bundle branch block/left anterior fascicular block - Continue to watch for conduction disease especially on amiodarone.  Chronic systolic heart failure - Possibly tachycardia mediated in part.  We will repeat echocardiogram.  Prior EF 25%. - Currently on Entresto Farxiga Toprol.  Spironolactone.  Excellent guideline directed medical therapy.  Nonobstructive CAD - Doing well stable  Moderate aortic stenosis - Echocardiogram reviewed as above.  Continue to monitor.  Diabetes with hypertension - Per primary team.  Chronic kidney disease stage IIIa - Creatinine 1.37 recently.   Expressed importance of maintaining good goal-directed medical therapy.  Follow  up in 4 months  Medication Adjustments/Labs and Tests Ordered: Current medicines are reviewed at length with the patient today.  Concerns regarding medicines are outlined above.  Orders Placed This Encounter  Procedures  . CBC  . Comprehensive metabolic panel  . T4, free  . TSH  . ECHOCARDIOGRAM COMPLETE   No orders of the defined types were placed in this encounter.   Patient Instructions  Medication Instructions:  The current medical regimen is effective;  continue present plan and medications.  *If you need a refill on your cardiac medications before your next appointment, please call your pharmacy*  Lab Work: Please have blood work today (CBC, CMP, TSH and FreeT4) If you have labs (blood work) drawn today and your tests are completely normal, you will receive your results only by: Marland Kitchen MyChart Message (if you have MyChart) OR . A paper copy in the mail If you have any lab test that is abnormal or we need to change your treatment, we will call you to review the results.  Testing/Procedures: Your physician has requested that you have an echocardiogram. Echocardiography is a painless test that uses sound waves to create images of your heart. It provides your doctor with information about the size and shape of your heart and how well your heart's chambers and valves are working. This procedure takes approximately one hour. There are no restrictions for this procedure.  Follow-Up: At [facility], you and your health needs are our priority.  As part of our continuing mission to provide you with exceptional heart care, we have created designated Provider Care Teams.  These Care Teams include your primary Cardiologist (physician) and Advanced Practice Providers (APPs -  Physician Assistants and Nurse Practitioners) who all work together to provide you with the care you need, when you need it.  We recommend signing up for the patient portal called "MyChart".  Sign up information is  provided on this After Visit Summary.  MyChart is used to connect with patients for Virtual Visits (Telemedicine).  Patients are able to view lab/test results,  encounter notes, upcoming appointments, etc.  Non-urgent messages can be sent to your provider as well.   To learn more about what you can do with MyChart, go to [email or web address].    Your next appointment:   4 month(s)  The format for your next appointment:   In Person  Provider:   Candee Furbish, MD   Thank you for choosing [place]!!         I,Alexis Bryant,acting as a scribe for Candee Furbish, MD.,have documented all relevant documentation on the behalf of Candee Furbish, MD,as directed by  Candee Furbish, MD while in the presence of Candee Furbish, MD.   I, Candee Furbish, MD, have reviewed all documentation for this visit. The documentation on 03/24/21 for the exam, diagnosis, procedures, and orders are all accurate and complete.  Signed, Candee Furbish, MD  03/24/2021 9:28 AM    [facility]

## 2021-03-24 NOTE — Patient Instructions (Signed)
Medication Instructions:  The current medical regimen is effective;  continue present plan and medications.  *If you need a refill on your cardiac medications before your next appointment, please call your pharmacy*  Lab Work: Please have blood work today (CBC, CMP, TSH and FreeT4) If you have labs (blood work) drawn today and your tests are completely normal, you will receive your results only by: Marland Kitchen MyChart Message (if you have MyChart) OR . A paper copy in the mail If you have any lab test that is abnormal or we need to change your treatment, we will call you to review the results.  Testing/Procedures: Your physician has requested that you have an echocardiogram. Echocardiography is a painless test that uses sound waves to create images of your heart. It provides your doctor with information about the size and shape of your heart and how well your heart's chambers and valves are working. This procedure takes approximately one hour. There are no restrictions for this procedure.  Follow-Up: At [facility], you and your health needs are our priority.  As part of our continuing mission to provide you with exceptional heart care, we have created designated Provider Care Teams.  These Care Teams include your primary Cardiologist (physician) and Advanced Practice Providers (APPs -  Physician Assistants and Nurse Practitioners) who all work together to provide you with the care you need, when you need it.  We recommend signing up for the patient portal called "MyChart".  Sign up information is provided on this After Visit Summary.  MyChart is used to connect with patients for Virtual Visits (Telemedicine).  Patients are able to view lab/test results, encounter notes, upcoming appointments, etc.  Non-urgent messages can be sent to your provider as well.   To learn more about what you can do with MyChart, go to [email or web address].    Your next appointment:   4 month(s)  The format for your  next appointment:   In Person  Provider:   Candee Furbish, MD   Thank you for choosing [facility]!!

## 2021-03-24 NOTE — Patient Instructions (Addendum)
Your blood pressure is high today.  Please see your primary care provider soon, to have it rechecked.   Please reduce the insulin to 60 units each morning, and: Please continue the same other diabetes medications.   check your blood sugar twice a day.  vary the time of day when you check, between before the 3 meals, and at bedtime.  also check if you have symptoms of your blood sugar being too high or too low.  please keep a record of the readings and bring it to your next appointment here (or you can bring the meter itself).  You can write it on any piece of paper.  please call us sooner if your blood sugar goes below 70, or if you have a lot of readings over 200.   Please come back for a follow-up appointment in 4 months.

## 2021-03-28 ENCOUNTER — Encounter: Payer: Self-pay | Admitting: *Deleted

## 2021-04-03 ENCOUNTER — Telehealth: Payer: Self-pay | Admitting: Cardiology

## 2021-04-03 NOTE — Telephone Encounter (Signed)
Matthew Schmitt is calling stating he has been having diarrhea for the past 8 days. He states the only other symptoms he is experiencing is being thirsty and tired. He is also losing weight and he is unsure why. Please advise.

## 2021-04-03 NOTE — Telephone Encounter (Signed)
Returned call to Pt.  Per Pt he has had "pretty severe" diarrhea for the last 8 days.   He has not had any new medications added to his regimen.  He states he has last 5 pounds and is "thirsty and weak".    Advised to contact his PCP as soon as possible to be evaluated.     Pt indicates understanding.

## 2021-04-04 DIAGNOSIS — Z6830 Body mass index (BMI) 30.0-30.9, adult: Secondary | ICD-10-CM | POA: Diagnosis not present

## 2021-04-04 DIAGNOSIS — R197 Diarrhea, unspecified: Secondary | ICD-10-CM | POA: Diagnosis not present

## 2021-04-13 DIAGNOSIS — H5213 Myopia, bilateral: Secondary | ICD-10-CM | POA: Diagnosis not present

## 2021-04-13 DIAGNOSIS — H25813 Combined forms of age-related cataract, bilateral: Secondary | ICD-10-CM | POA: Diagnosis not present

## 2021-04-13 DIAGNOSIS — H52203 Unspecified astigmatism, bilateral: Secondary | ICD-10-CM | POA: Diagnosis not present

## 2021-04-13 DIAGNOSIS — H524 Presbyopia: Secondary | ICD-10-CM | POA: Diagnosis not present

## 2021-04-13 DIAGNOSIS — E119 Type 2 diabetes mellitus without complications: Secondary | ICD-10-CM | POA: Diagnosis not present

## 2021-04-13 DIAGNOSIS — Z794 Long term (current) use of insulin: Secondary | ICD-10-CM | POA: Diagnosis not present

## 2021-04-21 ENCOUNTER — Other Ambulatory Visit (HOSPITAL_COMMUNITY): Payer: BC Managed Care – PPO

## 2021-05-10 ENCOUNTER — Other Ambulatory Visit: Payer: Self-pay

## 2021-05-10 ENCOUNTER — Ambulatory Visit (HOSPITAL_COMMUNITY): Payer: BC Managed Care – PPO | Attending: Cardiology

## 2021-05-10 DIAGNOSIS — I5042 Chronic combined systolic (congestive) and diastolic (congestive) heart failure: Secondary | ICD-10-CM | POA: Diagnosis not present

## 2021-05-10 LAB — ECHOCARDIOGRAM COMPLETE
AR max vel: 1.08 cm2
AV Area VTI: 1.2 cm2
AV Area mean vel: 1.15 cm2
AV Mean grad: 12 mmHg
AV Peak grad: 23.2 mmHg
Ao pk vel: 2.41 m/s
P 1/2 time: 368 msec
S' Lateral: 5 cm

## 2021-05-10 MED ORDER — PERFLUTREN LIPID MICROSPHERE
1.0000 mL | INTRAVENOUS | Status: AC | PRN
  Administered 2021-05-10: 1 mL via INTRAVENOUS

## 2021-06-02 ENCOUNTER — Encounter: Payer: Self-pay | Admitting: *Deleted

## 2021-06-02 ENCOUNTER — Telehealth: Payer: Self-pay | Admitting: *Deleted

## 2021-06-02 DIAGNOSIS — I350 Nonrheumatic aortic (valve) stenosis: Secondary | ICD-10-CM

## 2021-06-02 NOTE — Telephone Encounter (Signed)
Pump function remains low but mildly improved from prior.  Aortic stenosis is still mild to moderate.  Aortic root 64mm stable, mildly dilated.  Stable.  Repeat ECHO in one year  Candee Furbish, MD   Pt is aware of results - order placed to repeat in 1 yr.

## 2021-08-04 ENCOUNTER — Ambulatory Visit: Payer: BC Managed Care – PPO | Admitting: Endocrinology

## 2021-08-04 ENCOUNTER — Other Ambulatory Visit: Payer: Self-pay

## 2021-08-04 VITALS — BP 160/70 | HR 82 | Ht 71.0 in | Wt 224.2 lb

## 2021-08-04 DIAGNOSIS — E1159 Type 2 diabetes mellitus with other circulatory complications: Secondary | ICD-10-CM

## 2021-08-04 DIAGNOSIS — Z794 Long term (current) use of insulin: Secondary | ICD-10-CM | POA: Diagnosis not present

## 2021-08-04 LAB — POCT GLYCOSYLATED HEMOGLOBIN (HGB A1C): Hemoglobin A1C: 6.4 % — AB (ref 4.0–5.6)

## 2021-08-04 NOTE — Patient Instructions (Addendum)
Your blood pressure is high today.  Please see your primary care provider soon, to have it rechecked.   Please stop taking the Trulicity, and:  Please continue the same other diabetes medications.   check your blood sugar twice a day.  vary the time of day when you check, between before the 3 meals, and at bedtime.  also check if you have symptoms of your blood sugar being too high or too low.  please keep a record of the readings and bring it to your next appointment here (or you can bring the meter itself).  You can write it on any piece of paper.  please call us sooner if your blood sugar goes below 70, or if you have a lot of readings over 200.   Please come back for a follow-up appointment in 4 months.

## 2021-08-04 NOTE — Progress Notes (Signed)
Subjective:    Patient ID: Matthew Schmitt, male    DOB: [birth year], 70 y.o.   MRN: [id number]  HPI Pt returns for f/u of diabetes mellitus:  DM type: Insulin-requiring type 2.  Dx'ed: 2002.   Complications: renal insuff, leg ulcers, and CAD.   Therapy: insulin since 2015, Farxiga, and Trulicity.   DKA: never.   Severe hypoglycemia: once, in 2017.  Pancreatitis: never.   Other: he declines multiple daily injections;.  In early 2017, he requested to change levemir to lantus, due to high dosage requirement at the time; fructosamine has showed better glycemic control than A1c.  Interval history: He takes 60 units qam. Pt says cbg's vary from 109-175.  pt states he feels well in general.  Pt says he thinks the Trulicity is not helping.   Past Medical History:  Diagnosis Date   Anxiety    Arthritis    everywhere   Arthrosis of right acromioclavicular joint 01/29/2020   Atrial flutter [facility])    Onset May 2014 Cardioversion done    BPH associated with nocturia    BPH with obstruction/lower urinary tract symptoms 01/04/2017   CAD (coronary artery disease) 04/02/2013   Cath 2012 moderate mid LAD lesion, otherwise not much disease    Coronary artery disease    cardiologist-  dr Bettina Gavia (previouly dr Wynonia Lawman)   Diabetes [facility]) 01/18/2018   Encounter for long-term (current) use of other medications 07/27/2013   Essential hypertension, benign    First degree heart block    Heart murmur    History of bilateral knee replacement 02/07/2017   History of bladder stone    History of kidney stones    History of melanoma excision    June 2016  --- s/p  excision right  leg (per pt localized and no recurrence)   History of renal cell carcinoma 01/2017   s/p  left partial nephrecotmy   Hypertensive heart disease 04/02/2013   Impingement syndrome of right shoulder 01/29/2020   Impotence of organic origin    Left-sided low back pain with left-sided sciatica 01/23/2019   Long term current use of anticoagulant  therapy    Mixed hyperlipidemia    slightly   Multiple renal cysts    bilateral   Nephrolithiasis 05/17/2014   Nonischemic cardiomyopathy (HCC)    Osteoarthritis    PAF (paroxysmal atrial fibrillation) ([place])    followed by cardiology  (takes ASA)   RBBB    RBBB (right bundle branch block with left anterior fascicular block) 04/02/2013   Right ureteral stone    Rotator cuff syndrome of left shoulder 04/17/2016   Status post total left knee replacement 01/01/2020   Tendinopathy of right biceps tendon 01/29/2020   Tendinopathy of right rotator cuff 01/29/2020   Type 2 diabetes mellitus treated with insulin [facility])    endocrinologist-  dr Loanne Drilling   Wears glasses     Past Surgical History:  Procedure Laterality Date   CARDIAC CATHETERIZATION  08-17-2011  DR Tollie Eth   POSSIBLE MODERATE LAD STENOSIS JUST AFTER THE BIFURCATION OF LARGE DIAGONAL BRANCH/ LVEF  40-45%/ MILD GLOBAL HYPODINESIS (CATH DONE FOR ABNORMAL STRESS TEST OF FIXED LATERAL WALL DEFECT & BIFASCICULAR [address])   CARDIOVERSION N/A 04/02/2013   Procedure: CARDIOVERSION;  Surgeon: Jacolyn Reedy, MD;  Location: [place];  Service: Cardiovascular;  Laterality: N/A;   CARDIOVERSION N/A 12/27/2020   Procedure: CARDIOVERSION;  Surgeon: Skeet Latch, MD;  Location: [facility] ENDOSCOPY;  Service: Cardiovascular;  Laterality: N/A;  CYSTOSCOPY WITH LITHOLAPAXY N/A 05/18/2015   Procedure: CYSTOSCOPY WITH LITHOLAPAXY;  Surgeon: Rana Snare, MD;  Location: [facility];  Service: Urology;  Laterality: N/A;   CYSTOSCOPY WITH LITHOLAPAXY N/A 01/04/2017   Procedure: CYSTOSCOPY WITH LITHOLAPAXY, Holmium Laser;  Surgeon: Ardis Hughs, MD;  Location: [facility];  Service: Urology;  Laterality: N/A;   CYSTOSCOPY WITH RETROGRADE PYELOGRAM, URETEROSCOPY AND STENT PLACEMENT  11/03/2012   Procedure: CYSTOSCOPY WITH RETROGRADE PYELOGRAM, URETEROSCOPY AND STENT PLACEMENT;  Surgeon: Bernestine Amass, MD;  Location: [facility];   Service: Urology;  Laterality: Left;  Cystoscopy/Left Retrograde/Ureteroscopy/Holmium Laser Litho/Double J Stent   CYSTOSCOPY WITH URETEROSCOPY, STONE BASKETRY AND STENT PLACEMENT Right 05/15/2019   Procedure: CYSTOSCOPY WITH URETEROSCOPY, LASER LITHOTRIPSY STONE BASKETRY AND STENT PLACEMENT, RIGHT RETROGRADE;  Surgeon: Ardis Hughs, MD;  Location: [facility];  Service: Urology;  Laterality: Right;   HOLMIUM LASER APPLICATION  [id number]   Procedure: HOLMIUM LASER APPLICATION;  Surgeon: Bernestine Amass, MD;  Location: [facility];  Service: Urology;  Laterality: Left;   HOLMIUM LASER APPLICATION N/A [id number]   Procedure: HOLMIUM LASER APPLICATION;  Surgeon: Rana Snare, MD;  Location: [facility];  Service: Urology;  Laterality: N/A;   KNEE SURGERY Bilateral [place] Bilateral June 2016   20th-- left leg //  1st-- right leg w/ skin graft from right thigh (no lymph node bx)   NEPHROLITHOTOMY Right 05/17/2014   Procedure: NEPHROLITHOTOMY PERCUTANEOUS;  Surgeon: Bernestine Amass, MD;  Location: [facility];  Service: Urology;  Laterality: Right;   PILONIDAL CYST EXCISION     ROBOTIC ASSITED PARTIAL NEPHRECTOMY Left 06/18/2013   Procedure: ROBOTIC ASSISTED PARTIAL NEPHRECTOMY;  Surgeon: Dutch Gray, MD;  Location: [facility];  Service: Urology;  Laterality: Left;   ROTATOR CUFF REPAIR Left 07/2016   SHOULDER ARTHROSCOPY WITH ROTATOR CUFF REPAIR AND SUBACROMIAL DECOMPRESSION Right 05/27/2020   Procedure: RIGHT SHOULDER ARTHROSCOPY WITH EXTENSIVE DEBRIDEMENT, DISTAL CLAVICLE EXCISION AND SUBACROMIAL DECOMPRESSION;  Surgeon: Leandrew Koyanagi, MD;  Location: [place];  Service: Orthopedics;  Laterality: Right;   TEE WITH CARDIOVERSION  12/27/2020   TEE WITHOUT CARDIOVERSION N/A 12/27/2020   Procedure: TRANSESOPHAGEAL ECHOCARDIOGRAM (TEE);  Surgeon: Skeet Latch, MD;  Location: [facility] ENDOSCOPY;  Service: Cardiovascular;   Laterality: N/A;   TONSILLECTOMY  52  (age 89)   TOTAL KNEE ARTHROPLASTY Left 02/07/2017   Procedure: LEFT TOTAL KNEE ARTHROPLASTY;  Surgeon: Leandrew Koyanagi, MD;  Location: [place];  Service: Orthopedics;  Laterality: Left;   TOTAL KNEE ARTHROPLASTY Right 04/25/2017   Procedure: RIGHT TOTAL KNEE ARTHROPLASTY;  Surgeon: Leandrew Koyanagi, MD;  Location: [place];  Service: Orthopedics;  Laterality: Right;   TRANSURETHRAL RESECTION OF PROSTATE N/A 01/04/2017   Procedure: TRANSURETHRAL RESECTION OF THE PROSTATE (TURP);  Surgeon: Ardis Hughs, MD;  Location: [facility];  Service: Urology;  Laterality: N/A;    Social History   Socioeconomic History   Marital status: Married    Spouse name: Not on file   Number of children: 2   Years of education: 16   Highest education level: Not on file  Occupational History   Occupation: Government social research officer, Psychologist, occupational  Tobacco Use   Smoking status: Former    Packs/day: 1.00    Years: 6.00    Pack years: 6.00    Types: Cigarettes    Quit date: 10/31/1969    Years since quitting: 84.7  Smokeless tobacco: Never  Vaping Use   Vaping Use: Never used  Substance and Sexual Activity   Alcohol use: Yes    Alcohol/week: 0.0 standard drinks    Comment: OCCASIONAL   Drug use: No   Sexual activity: Never  Other Topics Concern   Not on file  Social History Narrative   Regular exercise-no   Social Determinants of Health   Financial Resource Strain: Not on file  Food Insecurity: Not on file  Transportation Needs: Not on file  Physical Activity: Not on file  Stress: Not on file  Social Connections: Not on file  Intimate Partner Violence: Not on file    Current Outpatient Medications on File Prior to Visit  Medication Sig Dispense Refill   amiodarone (PACERONE) 200 MG tablet Take '200mg'$  (1 tablet) twice daily for 2 weeks. Then on 01/12/2021, reduce dose to '200mg'$  once daily. 45 tablet 0   apixaban (ELIQUIS) 5 MG TABS tablet Take 1 tablet (5 mg total) by  mouth 2 (two) times daily. 60 tablet 0   dapagliflozin propanediol (FARXIGA) 10 MG TABS tablet Take 1 tablet (10 mg total) by mouth daily before breakfast. 30 tablet 0   Insulin Glargine (BASAGLAR KWIKPEN) 100 UNIT/ML Inject 60 Units into the skin every morning. And pen needles 1/day 60 mL 3   Insulin Pen Needle (PEN NEEDLES) 30G X 8 MM MISC 1 each by Does not apply route daily. E11.9 90 each 0   Lancets (ONETOUCH DELICA PLUS 123XX123) MISC 1 each by Other route 2 (two) times daily. E11.9 200 each 3   metoprolol succinate (TOPROL-XL) 50 MG 24 hr tablet TAKE 1 TABLET (50 MG TOTAL) BY MOUTH DAILY. TAKE WITH OR IMMEDIATELY FOLLOWING A MEAL. 30 tablet 0   rosuvastatin (CRESTOR) 20 MG tablet Take 1 tablet (20 mg total) by mouth daily. 90 tablet 3   sacubitril-valsartan (ENTRESTO) 49-51 MG TAKE 1 TABLET BY MOUTH TWO TIMES DAILY. 60 tablet 0   spironolactone (ALDACTONE) 25 MG tablet TAKE 1/2 TABLET (12.5 MG TOTAL) BY MOUTH DAILY. 15 tablet 0   trolamine salicylate (ASPERCREME) 10 % cream Apply 1 application topically as needed for muscle pain.     glucose blood (ONETOUCH ULTRA) test strip 1 each by Other route 2 (two) times daily. E11.9 200 strip 3   nitroGLYCERIN (NITROSTAT) 0.4 MG SL tablet Place 1 tablet (0.4 mg total) under the tongue every 5 (five) minutes as needed for chest pain. 30 tablet 3   No current facility-administered medications on file prior to visit.    Allergies  Allergen Reactions   Actos [Pioglitazone] Swelling and Cough    SWELLING REACTION UNSPECIFIED  EDEMA    Family History  Problem Relation Age of Onset   Arthritis Father    Hypertension Father    Diabetes Father    Arthritis Mother    Hypertension Mother    Heart attack Brother 68   Diabetes Brother    Lymphoma Paternal Grandfather    Cancer Maternal Uncle    Cancer Paternal Uncle        type unknown    BP (!) 160/70 (BP Location: Right Arm, Patient Position: Sitting, Cuff Size: Large)   Pulse 82   Ht 5'  11" (1.803 m)   Wt 224 lb 3.2 oz (101.7 kg)   SpO2 97%   BMI 31.27 kg/m    Review of Systems He denies hypoglycemia    Objective:   Physical Exam Pulses: dorsalis pedis intact bilat.   MSK:  no deformity of the feet.   CV: no leg edema.   Skin:  no ulcer on the feet.  normal color and temp on the feet, but there is severe hyperpigmentation of the legs.   Neuro: sensation is intact to touch on the feet.   Ext: there is bilateral onychomycosis of the toenails.  Both great toenails are absent.    Lab Results  Component Value Date   CREATININE 1.24 03/24/2021   BUN 16 03/24/2021   NA 142 03/24/2021   K 4.1 03/24/2021   CL 105 03/24/2021   CO2 22 03/24/2021    Lab Results  Component Value Date   HGBA1C 6.4 (A) 08/04/2021      Assessment & Plan:  Insulin-requiring type 2 DM: overcontrolled.  We discussed.  He chooses to d/c Trulicity, rather than reducing insulin  Patient Instructions  Your blood pressure is high today.  Please see your primary care provider soon, to have it rechecked.   Please stop taking the Trulicity, and:  Please continue the same other diabetes medications.   check your blood sugar twice a day.  vary the time of day when you check, between before the 3 meals, and at bedtime.  also check if you have symptoms of your blood sugar being too high or too low.  please keep a record of the readings and bring it to your next appointment here (or you can bring the meter itself).  You can write it on any piece of paper.  please call us sooner if your blood sugar goes below 70, or if you have a lot of readings over 200.   Please come back for a follow-up appointment in 4 months.

## 2021-08-17 NOTE — Progress Notes (Signed)
Cardiology Office Note:    Date:  08/18/2021   ID:  Matthew Schmitt, DOB 1951/09/12, MRN [id number]  PCP:  Leonides Sake, MD   [facility] Providers Cardiologist:  Candee Furbish, MD     Referring MD: Leonides Sake, MD    History of Present Illness:    Matthew Schmitt is a 70 y.o. male here for the follow-up of CHF EF 35%, CAD, and atrial fibrillation/ flutter.  He was seen in the ED/hospitalization on 12/26/20 for atrial flutter.  Had previously had DC cardioversion.  He was found to be in 2-1 atrial flutter with heart rates in the 160s tried beta-blocker.  In the ER his heart rates were in the 130s.  He was given IV amiodarone his echo showed a reduction in ejection fraction of 20 to 25%.  We went ahead and did a TEE cardioversion and he is maintaining sinus rhythm.  P.o. amiodarone.  Eliquis.  With his conduction disease, right bundle branch block left anterior fascicular block first-degree AV block we will watch for signs of worsening conduction.  With his EF reduction to we diuresed.  He was switched to [facility] in the hospital stay as well as Toprol.  Farxiga 10 mg also.  Spironolactone 12.5.  We will repeat echocardiogram since it has been 3 months.  Baseline creatinine has been approximately 1.4-1.5.  He is doing better since his hospital visit.He is taking 5 mg  Eliquis PO BID and reports bruising easily. However, it is nothing he is concerned about. His DM is being followed by ENDO.   Today: Overall he is feeling okay. He does not have any shortness of breath that he would consider out of the ordinary.  While in clinic for his recent diabetes follow-up his blood pressure was 160/80, but he noticed it was down to 114/60 the next morning at home.  A few of his medications that he takes twice a day has proven to be inconvenient due to his schedule. He has occasionally not taken his evening doses.   The front of his left LE shows discoloration that he would describe as  scarring.  He denies any palpitations, or chest pain. No lightheadedness, headaches, syncope, orthopnea, or PND. Also has no lower extremity edema or exertional symptoms.   Past Medical History:  Diagnosis Date   Anxiety    Arthritis    everywhere   Arthrosis of right acromioclavicular joint 01/29/2020   Atrial flutter [facility])    Onset May 2014 Cardioversion done    BPH associated with nocturia    BPH with obstruction/lower urinary tract symptoms 01/04/2017   CAD (coronary artery disease) 04/02/2013   Cath 2012 moderate mid LAD lesion, otherwise not much disease    Coronary artery disease    cardiologist-  dr Bettina Gavia (previouly dr Wynonia Lawman)   Diabetes [facility]) 01/18/2018   Encounter for long-term (current) use of other medications 07/27/2013   Essential hypertension, benign    First degree heart block    Heart murmur    History of bilateral knee replacement 02/07/2017   History of bladder stone    History of kidney stones    History of melanoma excision    June 2016  --- s/p  excision right  leg (per pt localized and no recurrence)   History of renal cell carcinoma 01/2017   s/p  left partial nephrecotmy   Hypertensive heart disease 04/02/2013   Impingement syndrome of right shoulder 01/29/2020   Impotence of organic origin  Left-sided low back pain with left-sided sciatica 01/23/2019   Long term current use of anticoagulant therapy    Mixed hyperlipidemia    slightly   Multiple renal cysts    bilateral   Nephrolithiasis 05/17/2014   Nonischemic cardiomyopathy (HCC)    Osteoarthritis    PAF (paroxysmal atrial fibrillation) ([place])    followed by cardiology  (takes ASA)   RBBB    RBBB (right bundle branch block with left anterior fascicular block) 04/02/2013   Right ureteral stone    Rotator cuff syndrome of left shoulder 04/17/2016   Status post total left knee replacement 01/01/2020   Tendinopathy of right biceps tendon 01/29/2020   Tendinopathy of right rotator cuff 01/29/2020   Type 2  diabetes mellitus treated with insulin [facility])    endocrinologist-  dr Loanne Drilling   Wears glasses     Past Surgical History:  Procedure Laterality Date   CARDIAC CATHETERIZATION  08-17-2011  DR Tollie Eth   POSSIBLE MODERATE LAD STENOSIS JUST AFTER THE BIFURCATION OF LARGE DIAGONAL BRANCH/ LVEF  40-45%/ MILD GLOBAL HYPODINESIS (CATH DONE FOR ABNORMAL STRESS TEST OF FIXED LATERAL WALL DEFECT & BIFASCICULAR [place])   CARDIOVERSION N/A 04/02/2013   Procedure: CARDIOVERSION;  Surgeon: Jacolyn Reedy, MD;  Location: [place];  Service: Cardiovascular;  Laterality: N/A;   CARDIOVERSION N/A 12/27/2020   Procedure: CARDIOVERSION;  Surgeon: Skeet Latch, MD;  Location: [place];  Service: Cardiovascular;  Laterality: N/A;   CYSTOSCOPY WITH LITHOLAPAXY N/A 05/18/2015   Procedure: CYSTOSCOPY WITH LITHOLAPAXY;  Surgeon: Rana Snare, MD;  Location[facility];  Service: Urology;  Laterality: N/A;   CYSTOSCOPY WITH LITHOLAPAXY N/A 01/04/2017   Procedure: CYSTOSCOPY WITH LITHOLAPAXY, Holmium Laser;  Surgeon: Ardis Hughs, MD;  Location: [facility];  Service: Urology;  Laterality: N/A;   CYSTOSCOPY WITH RETROGRADE PYELOGRAM, URETEROSCOPY AND STENT PLACEMENT  11/03/2012   Procedure: CYSTOSCOPY WITH RETROGRADE PYELOGRAM, URETEROSCOPY AND STENT PLACEMENT;  Surgeon: Bernestine Amass, MD;  Location: [facility];  Service: Urology;  Laterality: Left;  Cystoscopy/Left Retrograde/Ureteroscopy/Holmium Laser Litho/Double J Stent   CYSTOSCOPY WITH URETEROSCOPY, STONE BASKETRY AND STENT PLACEMENT Right 05/15/2019   Procedure: CYSTOSCOPY WITH URETEROSCOPY, LASER LITHOTRIPSY STONE BASKETRY AND STENT PLACEMENT, RIGHT RETROGRADE;  Surgeon: Ardis Hughs, MD;  Location: [facility];  Service: Urology;  Laterality: Right;   HOLMIUM LASER APPLICATION  41/96/2229   Procedure: HOLMIUM LASER APPLICATION;  Surgeon: Bernestine Amass, MD;  Location: [facility];   Service: Urology;  Laterality: Left;   HOLMIUM LASER APPLICATION N/A 7/98/9211   Procedure: HOLMIUM LASER APPLICATION;  Surgeon: Rana Snare, MD;  Location: [facility];  Service: Urology;  Laterality: N/A;   KNEE SURGERY Bilateral [place] Bilateral June 2016   20th-- left leg //  1st-- right leg w/ skin graft from right thigh (no lymph node bx)   NEPHROLITHOTOMY Right 05/17/2014   Procedure: NEPHROLITHOTOMY PERCUTANEOUS;  Surgeon: Bernestine Amass, MD;  Location[facility];  Service: Urology;  Laterality: Right;   PILONIDAL CYST EXCISION     ROBOTIC ASSITED PARTIAL NEPHRECTOMY Left 06/18/2013   Procedure: ROBOTIC ASSISTED PARTIAL NEPHRECTOMY;  Surgeon: Dutch Gray, MD;  Location[facility];  Service: Urology;  Laterality: Left;   ROTATOR CUFF REPAIR Left 07/2016   SHOULDER ARTHROSCOPY WITH ROTATOR CUFF REPAIR AND SUBACROMIAL DECOMPRESSION Right 05/27/2020   Procedure: RIGHT SHOULDER ARTHROSCOPY WITH EXTENSIVE DEBRIDEMENT, DISTAL CLAVICLE EXCISION AND SUBACROMIAL DECOMPRESSION;  Surgeon: Leandrew Koyanagi, MD;  Location: [place];  Service: Orthopedics;  Laterality: Right;   TEE WITH CARDIOVERSION  12/27/2020   TEE WITHOUT CARDIOVERSION N/A 12/27/2020   Procedure: TRANSESOPHAGEAL ECHOCARDIOGRAM (TEE);  Surgeon: Skeet Latch, MD;  Location: [facility];  Service: Cardiovascular;  Laterality: N/A;   TONSILLECTOMY  62  (age 47)   TOTAL KNEE ARTHROPLASTY Left 02/07/2017   Procedure: LEFT TOTAL KNEE ARTHROPLASTY;  Surgeon: Leandrew Koyanagi, MD;  Location: [place];  Service: Orthopedics;  Laterality: Left;   TOTAL KNEE ARTHROPLASTY Right 04/25/2017   Procedure: RIGHT TOTAL KNEE ARTHROPLASTY;  Surgeon: Leandrew Koyanagi, MD;  Location: [place];  Service: Orthopedics;  Laterality: Right;   TRANSURETHRAL RESECTION OF PROSTATE N/A 01/04/2017   Procedure: TRANSURETHRAL RESECTION OF THE PROSTATE (TURP);  Surgeon: Ardis Hughs, MD;  Location[facility];  Service: Urology;   Laterality: N/A;    Current Medications: Current Meds  Medication Sig   amiodarone (PACERONE) 200 MG tablet Take 200mg  (1 tablet) twice daily for 2 weeks. Then on 01/12/2021, reduce dose to 200mg  once daily.   dapagliflozin propanediol (FARXIGA) 10 MG TABS tablet Take 1 tablet (10 mg total) by mouth daily before breakfast.   Insulin Glargine (BASAGLAR KWIKPEN) 100 UNIT/ML Inject 60 Units into the skin every morning. And pen needles 1/day   Insulin Pen Needle (PEN NEEDLES) 30G X 8 MM MISC 1 each by Does not apply route daily. E11.9   Lancets (ONETOUCH DELICA PLUS FIEPPI95J) MISC 1 each by Other route 2 (two) times daily. E11.9   metoprolol succinate (TOPROL-XL) 50 MG 24 hr tablet TAKE 1 TABLET (50 MG TOTAL) BY MOUTH DAILY. TAKE WITH OR IMMEDIATELY FOLLOWING A MEAL.   nitroGLYCERIN (NITROSTAT) 0.4 MG SL tablet Place 1 tablet (0.4 mg total) under the tongue every 5 (five) minutes as needed for chest pain.   rivaroxaban (XARELTO) 20 MG TABS tablet Take 1 tablet (20 mg total) by mouth daily with supper.   rosuvastatin (CRESTOR) 20 MG tablet Take 1 tablet (20 mg total) by mouth daily.   sacubitril-valsartan (ENTRESTO) 49-51 MG TAKE 1 TABLET BY MOUTH TWO TIMES DAILY. (Patient taking differently: Take 1 tablet by mouth daily.)   spironolactone (ALDACTONE) 25 MG tablet TAKE 1/2 TABLET (12.5 MG TOTAL) BY MOUTH DAILY. (Patient taking differently: Take 25 mg by mouth every other day.)   trolamine salicylate (ASPERCREME) 10 % cream Apply 1 application topically as needed for muscle pain.   [DISCONTINUED] apixaban (ELIQUIS) 5 MG TABS tablet Take 1 tablet (5 mg total) by mouth 2 (two) times daily. (Patient taking differently: Take 5 mg by mouth daily.)     Allergies:   Actos [pioglitazone]   Social History   Socioeconomic History   Marital status: Married    Spouse name: Not on file   Number of children: 2   Years of education: 16   Highest education level: Not on file  Occupational History    Occupation: Government social research officer, Psychologist, occupational  Tobacco Use   Smoking status: Former    Packs/day: 1.00    Years: 6.00    Pack years: 6.00    Types: Cigarettes    Quit date: 10/31/1969    Years since quitting: 51.8   Smokeless tobacco: Never  Vaping Use   Vaping Use: Never used  Substance and Sexual Activity   Alcohol use: Yes    Alcohol/week: 0.0 standard drinks    Comment: OCCASIONAL   Drug use: No   Sexual activity:  Never  Other Topics Concern   Not on file  Social History Narrative   Regular exercise-no   Social Determinants of Health   Financial Resource Strain: Not on file  Food Insecurity: Not on file  Transportation Needs: Not on file  Physical Activity: Not on file  Stress: Not on file  Social Connections: Not on file     Family History: The patient's family history includes Arthritis in his father and mother; Cancer in his maternal uncle and paternal uncle; Diabetes in his brother and father; Heart attack (age of onset: 53) in his brother; Hypertension in his father and mother; Lymphoma in his paternal grandfather.  ROS:   Please see the history of present illness.   All other systems reviewed and are negative.  EKGs/Labs/Other Studies Reviewed:    The following studies were reviewed today:  Echo 05/10/2021:  1. Left ventricular ejection fraction, by estimation, is 30 to 35%. The  left ventricle has moderately decreased function. The left ventricle  demonstrates regional wall motion abnormalities (see scoring  diagram/findings for description). There is  moderate left ventricular hypertrophy. Left ventricular diastolic  parameters are indeterminate.   2. Right ventricular systolic function is mildly reduced. The right  ventricular size is normal. Tricuspid regurgitation signal is inadequate  for assessing PA pressure.   3. Left atrial size was mildly dilated.   4. The mitral valve is normal in structure. Mild to moderate mitral valve  regurgitation.    5. The aortic valve was not well visualized. Aortic valve regurgitation  is mild to moderate. Mild to moderate aortic valve stenosis. AS is mild by  gradients (MG 11 mmHg, Vmax 2.3 m/s) but moderate by AVA 1.0 cm^2 and DI 0.44. Low SV index (21 cc/m2), suspect low flow low gradient moderate AS   6. Aortic dilatation noted. There is dilatation of the aortic root,  measuring 43 mm. There is dilatation of the ascending aorta, measuring 43 mm.   Comparison(s): 12/27/20 EF 20-25%. Moderate AS 54mmHg mean PG. 34mmHg peak PG.   ECHO (12/27/20): 1. Severe global reduction in LV systolic function; probable fusion of right and left coronary cusps resulting in functionally bicuspid aortic valve; moderate AS (mean gradient 12 mmHg; AVA1 cm2); mild AI. 2. Left ventricular ejection fraction, by estimation, is 20 to 25%. The left ventricle has severely decreased function. The left ventricle demonstrates global hypokinesis. The left ventricular internal cavity size was moderately dilated. There is mild left ventricular hypertrophy. Left ventricular diastolic parameters are consistent with Grade III diastolic dysfunction (restrictive). Elevated left atrial pressure. 3. Right ventricular systolic function is normal. The right ventricular size is normal. 4. Left atrial size was severely dilated. 5. The mitral valve is normal in structure. Mild mitral valve regurgitation. No evidence of mitral stenosis. 6. The aortic valve is bicuspid. Aortic valve regurgitation is mild. Moderate aortic valve stenosis. 7. Aortic dilatation noted. There is mild dilatation of the aortic root, measuring 40 mm. There is mild dilatation of the ascending aorta, measuring 41 mm. 8. The inferior vena cava is dilated in size with <50% respiratory variability, suggesting right atrial pressure of 15 mmHg.  TEE (12/27/20): 1. Left ventricular ejection fraction, by estimation, is <20%. The left ventricle has severely decreased function. The  left ventricle demonstrates global hypokinesis. The left ventricular internal cavity size was mildly to moderately dilated.  2. Right ventricular systolic function is moderately reduced. The right ventricular size is normal. 3. No left atrial/left atrial appendage thrombus was detected. The  LAA emptying velocity was 38 cm/s. 4. Multiple mild regurgitant jets that collectively cause moderate mitral regurgitation. The mitral valve is normal in structure. Moderate mitral valve regurgitation. No evidence of mitral stenosis. 5. There is fusion and calcification of the left and right coronary cusps. The aortic valve is bicuspid. Aortic valve regurgitation is not visualized. No aortic stenosis is present. 6. Aortic dilatation noted. There is mild dilatation of the ascending aorta, measuring 41 mm. Conclusion(s)/Recommendation(s): Normal biventricular function without evidence of hemodynamically significant valvular heart disease.  EKG:  EKG is personally reviewed and interpreted. 08/18/2021: Typical atrial flutter heart rate 71 bpm with variable conduction) branch block left anterior fascicular block 03/24/21-EKG was not ordered.  Recent Labs: 12/19/2020: NT-Pro BNP 3,563 12/26/2020: B Natriuretic Peptide 412.0 03/24/2021: ALT 19; BUN 16; Creatinine, Ser 1.24; Hemoglobin 16.7; Platelets 172; Potassium 4.1; Sodium 142; TSH 0.996   Recent Lipid Panel    Component Value Date/Time   CHOL 149 12/19/2020 1656   TRIG 165 (H) 12/19/2020 1656   HDL 42 12/19/2020 1656   CHOLHDL 3.5 12/19/2020 1656   CHOLHDL 4.2 07/20/2014 0822   VLDL 26 07/20/2014 0822   LDLCALC 79 12/19/2020 1656     Risk Assessment/Calculations:      Physical Exam:    VS:  BP 130/70 (BP Location: Left Arm, Patient Position: Sitting, Cuff Size: Normal)   Pulse 79   Ht 5\' 11"  (1.803 m)   Wt 226 lb (102.5 kg)   SpO2 96%   BMI 31.52 kg/m     Wt Readings from Last 3 Encounters:  08/18/21 226 lb (102.5 kg)  08/04/21 224 lb 3.2 oz  (101.7 kg)  03/24/21 228 lb 3.2 oz (103.5 kg)   GEN: Well nourished, well developed in no acute distress HEENT: Normal NECK: No JVD; No carotid bruits LYMPHATICS: No lymphadenopathy CARDIAC: Slightly irregular, normal rate, 2/6 systolic aortic murmur, no rubs, no gallops RESPIRATORY:  Clear to auscultation without rales, wheezing or rhonchi  ABDOMEN: Soft, non-tender, non-distended MUSCULOSKELETAL:  No edema; No deformity  SKIN: Warm and dry. Discoloration/Scarring on anterior left BLE, 1+ edema NEUROLOGIC:  Alert and oriented x 3 PSYCHIATRIC:  Normal affect   ASSESSMENT:    1. PAF (paroxysmal atrial fibrillation) ([place])   2. Medication management   3. CAD in native artery   4. Hypertensive heart disease without heart failure   5. Chronic systolic heart failure ([place])   6. RBBB (right bundle branch block with left anterior fascicular block)   7. Coronary artery disease involving native coronary artery of native heart without angina pectoris   8. Long term current use of anticoagulant therapy   9. Nonrheumatic aortic valve stenosis   10. Chronic kidney disease, stage 3a ([place])   11. Hyperlipidemia   12. Dilated aortic root ([place])   13. Bicuspid aortic valve   14. Typical atrial flutter (HCC)     PLAN:    In order of problems listed above: Chronic systolic heart failure (HCC) His ejection fraction did increase slightly from 25% up to 35%.  Continue with Lisabeth Register and Toprol and spironolactone.  Checking lab work today.  He does admit that he is missing his second dose of Entresto quite often.  Encouraged use.  His blood pressure at times can fluctuate quite a bit.  Most of the time at home it is in the normal range.  Currently NYHA class II.  Minimal shortness of breath if any.  PAF (paroxysmal atrial fibrillation) (HCC) Currently on amiodarone, Toprol,  Eliquis.  Since he is missing his second dose of his Eliquis, we will switch to Xarelto 20 mg a day.  Checking lab work.   Occasional bruising.  Discoloration of his lower extremities noted, his father had the same thing.  In part dependent edema related.  RBBB (right bundle branch block with left anterior fascicular block) Unchanged.  No syncope.  Watch closely with amiodarone.  EKG as above.  CAD (coronary artery disease) Nonobstructive CAD from catheterization.  Long term current use of anticoagulant therapy Switching from Eliquis to Xarelto for convenience.  Aortic stenosis Moderate aortic stenosis echocardiogram.  Continuing to monitor.  Chronic kidney disease, stage 3a ([place]) Continue to watch closely with Entresto, spironolactone.  Checking lab work today.  Remember, switching over to Xarelto 20 mg.  Hyperlipidemia Continuing with rosuvastatin 20 mg.  Nonobstructive CAD previously.  No myalgias.  Last LDL 79 in January.  Triglycerides 165.  Dilated aortic root (HCC) 41 mm on echocardiogram 12/2020 continue to monitor  Bicuspid aortic valve.  Moderate aortic valve stenosis  Bicuspid aortic valve Mildly dilated aortic root/ascending aorta 41 mm.  Continue to monitor.  Moderate aortic stenosis.  Atrial flutter ([place]) Despite amiodarone use, his EKG today shows atypical appearing flutter negative in 2 3 aVF positive in V1.  His heart rate is 71 bpm, variable conduction.  Right bundle branch block left anterior fascicular block also noted.  I will go ahead and refer him to electrophysiology for flutter ablation consideration.   Expressed importance of maintaining good goal-directed medical therapy.  Follow up in 6 months  Medication Adjustments/Labs and Tests Ordered: Current medicines are reviewed at length with the patient today.  Concerns regarding medicines are outlined above.   Orders Placed This Encounter  Procedures   CBC   Comprehensive metabolic panel   TSH   T4, free   Ambulatory referral to Cardiac Electrophysiology   EKG 12-Lead    Meds ordered this encounter  Medications    rivaroxaban (XARELTO) 20 MG TABS tablet    Sig: Take 1 tablet (20 mg total) by mouth daily with supper.    Dispense:  30 tablet    Refill:  6    Patient Instructions  Medication Instructions:  Please discontinue your Eliquis and start Xarelto 20 mg a day.  Continue to take Eliquis until you receive Xarelto from [facility].  You will start Xarelto the following day. Continue all other medications as listed.  *If you need a refill on your cardiac medications before your next appointment, please call your pharmacy*  Lab Work: Please have blood work today (CBC, CMP, TSH and Free T4)  If you have labs (blood work) drawn today and your tests are completely normal, you will receive your results only by: [place] (if you have MyChart) OR A paper copy in the mail If you have any lab test that is abnormal or we need to change your treatment, we will call you to review the results.  You have been referred to Electrophysiology to discuss possible At Flutter Ablation.  Follow-Up: At [facility], you and your health needs are our priority.  As part of our continuing mission to provide you with exceptional heart care, we have created designated Provider Care Teams.  These Care Teams include your primary Cardiologist (physician) and Advanced Practice Providers (APPs -  Physician Assistants and Nurse Practitioners) who all work together to provide you with the care you need, when you need it.  We recommend signing up for  the patient portal called "MyChart".  Sign up information is provided on this After Visit Summary.  MyChart is used to connect with patients for Virtual Visits (Telemedicine).  Patients are able to view lab/test results, encounter notes, upcoming appointments, etc.  Non-urgent messages can be sent to your provider as well.   To learn more about what you can do with MyChart, go to [email or web address].    Your next appointment:   6 month(s)  The format for your next  appointment:   In Person  Provider:   Candee Furbish, MD   Thank you for choosing [place]!!      I,Mathew Stumpf,acting as a scribe for Candee Furbish, MD.,have documented all relevant documentation on the behalf of Candee Furbish, MD,as directed by  Candee Furbish, MD while in the presence of Candee Furbish, MD.  I, Candee Furbish, MD, have reviewed all documentation for this visit. The documentation on 08/18/21 for the exam, diagnosis, procedures, and orders are all accurate and complete.  Signed, Candee Furbish, MD  08/18/2021 9:52 AM    [facility]

## 2021-08-18 ENCOUNTER — Ambulatory Visit: Payer: BC Managed Care – PPO | Admitting: Cardiology

## 2021-08-18 ENCOUNTER — Encounter: Payer: Self-pay | Admitting: Cardiology

## 2021-08-18 ENCOUNTER — Other Ambulatory Visit: Payer: Self-pay

## 2021-08-18 VITALS — BP 130/70 | HR 79 | Ht 71.0 in | Wt 226.0 lb

## 2021-08-18 DIAGNOSIS — I452 Bifascicular block: Secondary | ICD-10-CM

## 2021-08-18 DIAGNOSIS — I483 Typical atrial flutter: Secondary | ICD-10-CM

## 2021-08-18 DIAGNOSIS — I5020 Unspecified systolic (congestive) heart failure: Secondary | ICD-10-CM

## 2021-08-18 DIAGNOSIS — Z7901 Long term (current) use of anticoagulants: Secondary | ICD-10-CM

## 2021-08-18 DIAGNOSIS — I480 Paroxysmal atrial fibrillation: Secondary | ICD-10-CM | POA: Diagnosis not present

## 2021-08-18 DIAGNOSIS — I2510 Atherosclerotic heart disease of native coronary artery without angina pectoris: Secondary | ICD-10-CM | POA: Diagnosis not present

## 2021-08-18 DIAGNOSIS — I350 Nonrheumatic aortic (valve) stenosis: Secondary | ICD-10-CM | POA: Insufficient documentation

## 2021-08-18 DIAGNOSIS — I77810 Thoracic aortic ectasia: Secondary | ICD-10-CM | POA: Insufficient documentation

## 2021-08-18 DIAGNOSIS — N1831 Chronic kidney disease, stage 3a: Secondary | ICD-10-CM | POA: Insufficient documentation

## 2021-08-18 DIAGNOSIS — E782 Mixed hyperlipidemia: Secondary | ICD-10-CM

## 2021-08-18 DIAGNOSIS — I5022 Chronic systolic (congestive) heart failure: Secondary | ICD-10-CM | POA: Insufficient documentation

## 2021-08-18 DIAGNOSIS — I119 Hypertensive heart disease without heart failure: Secondary | ICD-10-CM | POA: Diagnosis not present

## 2021-08-18 DIAGNOSIS — Z79899 Other long term (current) drug therapy: Secondary | ICD-10-CM | POA: Diagnosis not present

## 2021-08-18 DIAGNOSIS — Q231 Congenital insufficiency of aortic valve: Secondary | ICD-10-CM | POA: Insufficient documentation

## 2021-08-18 HISTORY — DX: Unspecified systolic (congestive) heart failure: I50.20

## 2021-08-18 LAB — COMPREHENSIVE METABOLIC PANEL
ALT: 20 IU/L (ref 0–44)
AST: 20 IU/L (ref 0–40)
Albumin/Globulin Ratio: 2.8 — ABNORMAL HIGH (ref 1.2–2.2)
Albumin: 4.4 g/dL (ref 3.8–4.8)
Alkaline Phosphatase: 62 IU/L (ref 44–121)
BUN/Creatinine Ratio: 18 (ref 10–24)
BUN: 22 mg/dL (ref 8–27)
Bilirubin Total: 0.5 mg/dL (ref 0.0–1.2)
CO2: 24 mmol/L (ref 20–29)
Calcium: 9.4 mg/dL (ref 8.6–10.2)
Chloride: 105 mmol/L (ref 96–106)
Creatinine, Ser: 1.21 mg/dL (ref 0.76–1.27)
Globulin, Total: 1.6 g/dL (ref 1.5–4.5)
Glucose: 149 mg/dL — ABNORMAL HIGH (ref 70–99)
Potassium: 4.2 mmol/L (ref 3.5–5.2)
Sodium: 144 mmol/L (ref 134–144)
Total Protein: 6 g/dL (ref 6.0–8.5)
eGFR: 64 mL/min/{1.73_m2} (ref >59)

## 2021-08-18 LAB — CBC
Hematocrit: 52.1 % — ABNORMAL HIGH (ref 37.5–51.0)
Hemoglobin: 17.5 g/dL (ref 13.0–17.7)
MCH: 27.9 pg (ref 26.6–33.0)
MCHC: 33.6 g/dL (ref 31.5–35.7)
MCV: 83 fL (ref 79–97)
Platelets: 162 10*3/uL (ref 150–450)
RBC: 6.28 x10E6/uL — ABNORMAL HIGH (ref 4.14–5.80)
RDW: 12.6 % (ref 11.6–15.4)
WBC: 6.1 10*3/uL (ref 3.4–10.8)

## 2021-08-18 LAB — T4, FREE: Free T4: 1.35 ng/dL (ref 0.82–1.77)

## 2021-08-18 LAB — TSH: TSH: 0.469 u[IU]/mL (ref 0.450–4.500)

## 2021-08-18 MED ORDER — RIVAROXABAN 20 MG PO TABS: 20.0000 mg | ORAL_TABLET | Freq: Every day | ORAL | 6 refills | Status: DC

## 2021-08-18 NOTE — Assessment & Plan Note (Signed)
Continuing with rosuvastatin 20 mg.  Nonobstructive CAD previously.  No myalgias.  Last LDL 79 in January.  Triglycerides 165.

## 2021-08-18 NOTE — Assessment & Plan Note (Signed)
>>  ASSESSMENT AND PLAN FOR CHRONIC SYSTOLIC HEART FAILURE (HCC) WRITTEN ON 08/18/2021  8:28 AM BY SKAINS, MARK C, MD  His ejection fraction did increase slightly from 25% up to 35%.  Continue with Clifton Custard and Toprol and spironolactone.  Checking lab work today.  He does admit that he is missing his second dose of Entresto quite often.  Encouraged use.  His blood pressure at times can fluctuate quite a bit.  Most of the time at home it is in the normal range.  Currently NYHA class II.  Minimal shortness of breath if any.

## 2021-08-18 NOTE — Assessment & Plan Note (Signed)
Moderate aortic stenosis echocardiogram.  Continuing to monitor.

## 2021-08-18 NOTE — Assessment & Plan Note (Signed)
Currently on amiodarone, Toprol, Eliquis.  Since he is missing his second dose of his Eliquis, we will switch to Xarelto 20 mg a day.  Checking lab work.  Occasional bruising.  Discoloration of his lower extremities noted, his father had the same thing.  In part dependent edema related.

## 2021-08-18 NOTE — Assessment & Plan Note (Signed)
Nonobstructive CAD from catheterization.

## 2021-08-18 NOTE — Patient Instructions (Addendum)
Medication Instructions:  Please discontinue your Eliquis and start Xarelto 20 mg a day.  Continue to take Eliquis until you receive Xarelto from [facility].  You will start Xarelto the following day. Continue all other medications as listed.  *If you need a refill on your cardiac medications before your next appointment, please call your pharmacy*  Lab Work: Please have blood work today (CBC, CMP, TSH and Free T4)  If you have labs (blood work) drawn today and your tests are completely normal, you will receive your results only by: [place] (if you have MyChart) OR A paper copy in the mail If you have any lab test that is abnormal or we need to change your treatment, we will call you to review the results.  You have been referred to Electrophysiology to discuss possible At Flutter Ablation.  Follow-Up: At [facility], you and your health needs are our priority.  As part of our continuing mission to provide you with exceptional heart care, we have created designated Provider Care Teams.  These Care Teams include your primary Cardiologist (physician) and Advanced Practice Providers (APPs -  Physician Assistants and Nurse Practitioners) who all work together to provide you with the care you need, when you need it.  We recommend signing up for the patient portal called "MyChart".  Sign up information is provided on this After Visit Summary.  MyChart is used to connect with patients for Virtual Visits (Telemedicine).  Patients are able to view lab/test results, encounter notes, upcoming appointments, etc.  Non-urgent messages can be sent to your provider as well.   To learn more about what you can do with MyChart, go to [email or web address].    Your next appointment:   6 month(s)  The format for your next appointment:   In Person  Provider:   Candee Furbish, MD   Thank you for choosing [facility]!!

## 2021-08-18 NOTE — Assessment & Plan Note (Signed)
Unchanged.  No syncope.  Watch closely with amiodarone.  EKG as above.

## 2021-08-18 NOTE — Assessment & Plan Note (Signed)
Mildly dilated aortic root/ascending aorta 41 mm.  Continue to monitor.  Moderate aortic stenosis.

## 2021-08-18 NOTE — Assessment & Plan Note (Signed)
Switching from Eliquis to Xarelto for convenience.

## 2021-08-18 NOTE — Assessment & Plan Note (Signed)
Despite amiodarone use, his EKG today shows atypical appearing flutter negative in 2 3 aVF positive in V1.  His heart rate is 71 bpm, variable conduction.  Right bundle branch block left anterior fascicular block also noted.  I will go ahead and refer him to electrophysiology for flutter ablation consideration.

## 2021-08-18 NOTE — Assessment & Plan Note (Signed)
His ejection fraction did increase slightly from 25% up to 35%.  Continue with Lisabeth Register and Toprol and spironolactone.  Checking lab work today.  He does admit that he is missing his second dose of Entresto quite often.  Encouraged use.  His blood pressure at times can fluctuate quite a bit.  Most of the time at home it is in the normal range.  Currently NYHA class II.  Minimal shortness of breath if any.

## 2021-08-18 NOTE — Assessment & Plan Note (Signed)
41 mm on echocardiogram 12/2020 continue to monitor  Bicuspid aortic valve.  Moderate aortic valve stenosis

## 2021-08-18 NOTE — Assessment & Plan Note (Signed)
Continue to watch closely with Entresto, spironolactone.  Checking lab work today.  Remember, switching over to Xarelto 20 mg.

## 2021-08-21 ENCOUNTER — Encounter: Payer: Self-pay | Admitting: *Deleted

## 2021-08-24 ENCOUNTER — Telehealth: Payer: Self-pay

## 2021-08-24 NOTE — Telephone Encounter (Signed)
**Note De-Identified Ronnisha Felber Obfuscation** Due to letter received from [place] called them and did a Xarelto PA over the phone with [place]. Per Joseph Art this PA is approved until 08/24/2022. Case #[id number]

## 2021-09-21 NOTE — Progress Notes (Signed)
Electrophysiology Office Note:    Date:  09/22/2021   ID:  Matthew Schmitt, DOB December 22, 1950, MRN [id number]  PCP:  Leonides Sake, MD  [facility] Cardiologist:  Candee Furbish, MD  [facility] Electrophysiologist:  Vickie Epley, MD   Referring MD: Jerline Pain, MD   Chief Complaint: Atrial fibrillation  History of Present Illness:    Matthew Schmitt is a 70 y.o. male who presents for an evaluation of atrial fibrillation at the request of Dr. Marlou Porch. Their medical history includes BPH, coronary artery disease, diabetes, hypertension, right bundle branch block and left anterior fascicular block.  The patient last saw Dr. Marlou Porch August 18, 2021.  The patient has an ischemic cardiomyopathy with an ejection fraction of around 35% that is followed by Dr. Marlou Porch.  He has been in persistent atrial flutter despite treatment with amiodarone.     Past Medical History:  Diagnosis Date   Anxiety    Arthritis    everywhere   Arthrosis of right acromioclavicular joint 01/29/2020   Atrial flutter [facility])    Onset May 2014 Cardioversion done    BPH associated with nocturia    BPH with obstruction/lower urinary tract symptoms 01/04/2017   CAD (coronary artery disease) 04/02/2013   Cath 2012 moderate mid LAD lesion, otherwise not much disease    Coronary artery disease    cardiologist-  dr Bettina Gavia (previouly dr Wynonia Lawman)   Diabetes [facility]) 01/18/2018   Encounter for long-term (current) use of other medications 07/27/2013   Essential hypertension, benign    First degree heart block    Heart murmur    History of bilateral knee replacement 02/07/2017   History of bladder stone    History of kidney stones    History of melanoma excision    June 2016  --- s/p  excision right  leg (per pt localized and no recurrence)   History of renal cell carcinoma 01/2017   s/p  left partial nephrecotmy   Hypertensive heart disease 04/02/2013   Impingement syndrome of right shoulder 01/29/2020   Impotence of  organic origin    Left-sided low back pain with left-sided sciatica 01/23/2019   Long term current use of anticoagulant therapy    Mixed hyperlipidemia    slightly   Multiple renal cysts    bilateral   Nephrolithiasis 05/17/2014   Nonischemic cardiomyopathy (HCC)    Osteoarthritis    PAF (paroxysmal atrial fibrillation) ([place])    followed by cardiology  (takes ASA)   RBBB    RBBB (right bundle branch block with left anterior fascicular block) 04/02/2013   Right ureteral stone    Rotator cuff syndrome of left shoulder 04/17/2016   Status post total left knee replacement 01/01/2020   Tendinopathy of right biceps tendon 01/29/2020   Tendinopathy of right rotator cuff 01/29/2020   Type 2 diabetes mellitus treated with insulin [facility])    endocrinologist-  dr Loanne Drilling   Wears glasses     Past Surgical History:  Procedure Laterality Date   CARDIAC CATHETERIZATION  08-17-2011  DR Tollie Eth   POSSIBLE MODERATE LAD STENOSIS JUST AFTER THE BIFURCATION OF LARGE DIAGONAL BRANCH/ LVEF  40-45%/ MILD GLOBAL HYPODINESIS (CATH DONE FOR ABNORMAL STRESS TEST OF FIXED LATERAL WALL DEFECT & BIFASCICULAR [place])   CARDIOVERSION N/A 04/02/2013   Procedure: CARDIOVERSION;  Surgeon: Jacolyn Reedy, MD;  Location: [place];  Service: Cardiovascular;  Laterality: N/A;   CARDIOVERSION N/A 12/27/2020   Procedure: CARDIOVERSION;  Surgeon: Skeet Latch, MD;  [facility]: [facility];  Service: Cardiovascular;  Laterality: N/A;   CYSTOSCOPY WITH LITHOLAPAXY N/A 05/18/2015   Procedure: CYSTOSCOPY WITH LITHOLAPAXY;  Surgeon: Rana Snare, MD;  Location: [facility];  Service: Urology;  Laterality: N/A;   CYSTOSCOPY WITH LITHOLAPAXY N/A 01/04/2017   Procedure: CYSTOSCOPY WITH LITHOLAPAXY, Holmium Laser;  Surgeon: Ardis Hughs, MD;  Location: [facility];  Service: Urology;  Laterality: N/A;   CYSTOSCOPY WITH RETROGRADE PYELOGRAM, URETEROSCOPY AND STENT PLACEMENT  11/03/2012   Procedure: CYSTOSCOPY WITH  RETROGRADE PYELOGRAM, URETEROSCOPY AND STENT PLACEMENT;  Surgeon: Bernestine Amass, MD;  Location: [facility];  Service: Urology;  Laterality: Left;  Cystoscopy/Left Retrograde/Ureteroscopy/Holmium Laser Litho/Double J Stent   CYSTOSCOPY WITH URETEROSCOPY, STONE BASKETRY AND STENT PLACEMENT Right 05/15/2019   Procedure: CYSTOSCOPY WITH URETEROSCOPY, LASER LITHOTRIPSY STONE BASKETRY AND STENT PLACEMENT, RIGHT RETROGRADE;  Surgeon: Ardis Hughs, MD;  Location: [facility];  Service: Urology;  Laterality: Right;   HOLMIUM LASER APPLICATION  58/07/9832   Procedure: HOLMIUM LASER APPLICATION;  Surgeon: Bernestine Amass, MD;  Location: [facility];  Service: Urology;  Laterality: Left;   HOLMIUM LASER APPLICATION N/A 07/13/538   Procedure: HOLMIUM LASER APPLICATION;  Surgeon: Rana Snare, MD;  Location[facility];  Service: Urology;  Laterality: N/A;   KNEE SURGERY Bilateral [place] Bilateral June 2016   20th-- left leg //  1st-- right leg w/ skin graft from right thigh (no lymph node bx)   NEPHROLITHOTOMY Right 05/17/2014   Procedure: NEPHROLITHOTOMY PERCUTANEOUS;  Surgeon: Bernestine Amass, MD;  [facility];  Service: Urology;  Laterality: Right;   PILONIDAL CYST EXCISION     ROBOTIC ASSITED PARTIAL NEPHRECTOMY Left 06/18/2013   Procedure: ROBOTIC ASSISTED PARTIAL NEPHRECTOMY;  Surgeon: Dutch Gray, MD;  Location[facility];  Service: Urology;  Laterality: Left;   ROTATOR CUFF REPAIR Left 07/2016   SHOULDER ARTHROSCOPY WITH ROTATOR CUFF REPAIR AND SUBACROMIAL DECOMPRESSION Right 05/27/2020   Procedure: RIGHT SHOULDER ARTHROSCOPY WITH EXTENSIVE DEBRIDEMENT, DISTAL CLAVICLE EXCISION AND SUBACROMIAL DECOMPRESSION;  Surgeon: Leandrew Koyanagi, MD;  Location: [place];  Service: Orthopedics;  Laterality: Right;   TEE WITH CARDIOVERSION  12/27/2020   TEE WITHOUT CARDIOVERSION N/A 12/27/2020   Procedure:  TRANSESOPHAGEAL ECHOCARDIOGRAM (TEE);  Surgeon: Skeet Latch, MD;  Location: [facility];  Service: Cardiovascular;  Laterality: N/A;   TONSILLECTOMY  30  (age 70)   TOTAL KNEE ARTHROPLASTY Left 02/07/2017   Procedure: LEFT TOTAL KNEE ARTHROPLASTY;  Surgeon: Leandrew Koyanagi, MD;  Location: [place];  Service: Orthopedics;  Laterality: Left;   TOTAL KNEE ARTHROPLASTY Right 04/25/2017   Procedure: RIGHT TOTAL KNEE ARTHROPLASTY;  Surgeon: Leandrew Koyanagi, MD;  Location: [place];  Service: Orthopedics;  Laterality: Right;   TRANSURETHRAL RESECTION OF PROSTATE N/A 01/04/2017   Procedure: TRANSURETHRAL RESECTION OF THE PROSTATE (TURP);  Surgeon: Ardis Hughs, MD;  [facility];  Service: Urology;  Laterality: N/A;    Current Medications: Current Meds  Medication Sig   amiodarone (PACERONE) 200 MG tablet Take 200mg  (1 tablet) twice daily for 2 weeks. Then on 01/12/2021, reduce dose to 200mg  once daily.   dapagliflozin propanediol (FARXIGA) 10 MG TABS tablet Take 1 tablet (10 mg total) by mouth daily before breakfast.   Insulin Glargine (BASAGLAR KWIKPEN) 100 UNIT/ML Inject 60 Units into the skin every morning. And pen needles 1/day   Insulin Pen Needle (PEN NEEDLES) 30G X 8  MM MISC 1 each by Does not apply route daily. E11.9   Lancets (ONETOUCH DELICA PLUS ZWCHEN27P) MISC 1 each by Other route 2 (two) times daily. E11.9   metoprolol succinate (TOPROL-XL) 50 MG 24 hr tablet TAKE 1 TABLET (50 MG TOTAL) BY MOUTH DAILY. TAKE WITH OR IMMEDIATELY FOLLOWING A MEAL.   nitroGLYCERIN (NITROSTAT) 0.4 MG SL tablet Place 1 tablet (0.4 mg total) under the tongue every 5 (five) minutes as needed for chest pain.   rivaroxaban (XARELTO) 20 MG TABS tablet Take 1 tablet (20 mg total) by mouth daily with supper.   rosuvastatin (CRESTOR) 20 MG tablet Take 1 tablet (20 mg total) by mouth daily.   sacubitril-valsartan (ENTRESTO) 49-51 MG TAKE 1 TABLET BY MOUTH TWO TIMES DAILY. (Patient taking differently: Take 1 tablet  by mouth daily.)   spironolactone (ALDACTONE) 25 MG tablet TAKE 1/2 TABLET (12.5 MG TOTAL) BY MOUTH DAILY. (Patient taking differently: Take 25 mg by mouth every other day.)   trolamine salicylate (ASPERCREME) 10 % cream Apply 1 application topically as needed for muscle pain.     Allergies:   Actos [pioglitazone]   Social History   Socioeconomic History   Marital status: Married    Spouse name: Not on file   Number of children: 2   Years of education: 16   Highest education level: Not on file  Occupational History   Occupation: Government social research officer, Psychologist, occupational  Tobacco Use   Smoking status: Former    Packs/day: 1.00    Years: 6.00    Pack years: 6.00    Types: Cigarettes    Quit date: 10/31/1969    Years since quitting: 51.9   Smokeless tobacco: Never  Vaping Use   Vaping Use: Never used  Substance and Sexual Activity   Alcohol use: Yes    Alcohol/week: 0.0 standard drinks    Comment: OCCASIONAL   Drug use: No   Sexual activity: Never  Other Topics Concern   Not on file  Social History Narrative   Regular exercise-no   Social Determinants of Health   Financial Resource Strain: Not on file  Food Insecurity: Not on file  Transportation Needs: Not on file  Physical Activity: Not on file  Stress: Not on file  Social Connections: Not on file     Family History: The patient's family history includes Arthritis in his father and mother; Cancer in his maternal uncle and paternal uncle; Diabetes in his brother and father; Heart attack (age of onset: 44) in his brother; Hypertension in his father and mother; Lymphoma in his paternal grandfather.  ROS:   Please see the history of present illness.    All other systems reviewed and are negative.  EKGs/Labs/Other Studies Reviewed:    The following studies were reviewed today:  May 10, 2021 echo Left ventricular function moderately decreased, 30 to 35% Right ventricular function mildly reduced Mildly dilated left  atrium Mild to moderate MR Mild to moderate AI Mild to moderate AAS Mildly dilated aortic root  December 27, 2020 transesophageal echo Bicuspid aortic valve  August 18, 2021 ECG Right bundle branch block Typical appearing atrial flutter with variable AV conduction Left anterior fascicular block  December 27, 2020 EKG shows atrial flutter with 2 1 AV conduction, right bundle branch block, left anterior fascicular block  EKGs between 2022 and 2014 all show sinus rhythm or atrial flutter.  Apr 02, 2013 EKG shows atrial flutter with 2-1 AV conduction, right bundle branch block and a left anterior  fascicular block  EKGs before 2014 dating back to the earliest EKG in our record (November 03, 2012) shows sinus rhythm  EKG:  The ekg ordered today demonstrates atrial flutter, right bundle branch block, left anterior fascicular block   Recent Labs: 12/19/2020: NT-Pro BNP 3,563 12/26/2020: B Natriuretic Peptide 412.0 08/18/2021: ALT 20; BUN 22; Creatinine, Ser 1.21; Hemoglobin 17.5; Platelets 162; Potassium 4.2; Sodium 144; TSH 0.469  Recent Lipid Panel    Component Value Date/Time   CHOL 149 12/19/2020 1656   TRIG 165 (H) 12/19/2020 1656   HDL 42 12/19/2020 1656   CHOLHDL 3.5 12/19/2020 1656   CHOLHDL 4.2 07/20/2014 0822   VLDL 26 07/20/2014 0822   LDLCALC 79 12/19/2020 1656    Physical Exam:    VS:  BP 138/86   Pulse 72   Ht 5\' 11"  (1.803 m)   Wt 227 lb (103 kg)   SpO2 97%   BMI 31.66 kg/m     Wt Readings from Last 3 Encounters:  09/22/21 227 lb (103 kg)  08/18/21 226 lb (102.5 kg)  08/04/21 224 lb 3.2 oz (101.7 kg)     GEN:  Well nourished, well developed in no acute distress HEENT: Normal NECK: No JVD; No carotid bruits LYMPHATICS: No lymphadenopathy CARDIAC: RRR, no murmurs, rubs, gallops RESPIRATORY:  Clear to auscultation without rales, wheezing or rhonchi  ABDOMEN: Soft, non-tender, non-distended MUSCULOSKELETAL:  No edema; No deformity  SKIN: Warm and  dry NEUROLOGIC:  Alert and oriented x 3 PSYCHIATRIC:  Normal affect       ASSESSMENT:    1. Typical atrial flutter ([place])   2. RBBB (right bundle branch block with left anterior fascicular block)   3. Coronary artery disease involving native coronary artery of native heart without angina pectoris   4. Chronic combined systolic and diastolic CHF (congestive heart failure) ([place])   5. Bicuspid aortic valve    PLAN:    In order of problems listed above:  #Typical atrial flutter Rhythm control is indicated given chronic systolic heart failure.  We discussed treatment options for his atrial flutter including catheter ablation and antiarrhythmic drugs.  He is on amiodarone but this has not been effective at maintaining normal rhythm.  I would recommend catheter ablation at this time.  We discussed the procedure in detail including the risk, recovery and efficacy and he wishes to proceed.  I will also get a CT scan prior to the procedure to assess his right atrial and left atrial anatomy.  I will have him wear a 1 week ZIO monitor to exclude atrial fibrillation as this would obviously change the ablation approach.  We did discuss the A. fib ablation during today's visit in case that is necessary.  He will continue taking Xarelto for stroke prophylaxis.   Risk, benefits, and alternatives to EP study and radiofrequency ablation for afib/flutter were also discussed in detail today. These risks include but are not limited to stroke, bleeding, vascular damage, tamponade, perforation, damage to the esophagus, lungs, and other structures, pulmonary vein stenosis, worsening renal function, and death. The patient understands these risk and wishes to proceed.  We will therefore proceed with catheter ablation at the next available time.  Carto, ICE, anesthesia are requested for the procedure.  Will also obtain CT PV protocol prior to the procedure to exclude LAA thrombus and further evaluate atrial  anatomy.   #Chronic systolic heart failure NYHA class II.  Warm and dry on exam today.  On good medical therapy.  We will need to reassess his left ventricular function after the typical atrial flutter ablation.  If he has persistently reduced ejection fraction, we will need to discuss defibrillator therapy.  #Right bundle branch block and left anterior fascicular block We discussed his baseline conduction disease during today's appointment.  We discussed the possibility that he would require pacemaker as he ages.    Total time spent with patient today 65 minutes. This includes reviewing records, evaluating the patient and coordinating care.  Medication Adjustments/Labs and Tests Ordered: Current medicines are reviewed at length with the patient today.  Concerns regarding medicines are outlined above.  Orders Placed This Encounter  Procedures   CT CARDIAC MORPH/PULM VEIN W/CM&W/O CA SCORE   Basic Metabolic Panel (BMET)   CBC w/Diff   LONG TERM MONITOR (3-14 DAYS)   EKG 12-Lead   No orders of the defined types were placed in this encounter.    Signed, Hilton Cork. Quentin Ore, MD, [facility], [facility] 09/22/2021 8:35 AM    Electrophysiology [facility]

## 2021-09-22 ENCOUNTER — Ambulatory Visit (INDEPENDENT_AMBULATORY_CARE_PROVIDER_SITE_OTHER): Payer: BC Managed Care – PPO

## 2021-09-22 ENCOUNTER — Ambulatory Visit: Payer: BC Managed Care – PPO | Admitting: Cardiology

## 2021-09-22 ENCOUNTER — Encounter: Payer: Self-pay | Admitting: Cardiology

## 2021-09-22 ENCOUNTER — Other Ambulatory Visit: Payer: Self-pay

## 2021-09-22 VITALS — BP 138/86 | HR 72 | Ht 71.0 in | Wt 227.0 lb

## 2021-09-22 DIAGNOSIS — I452 Bifascicular block: Secondary | ICD-10-CM | POA: Diagnosis not present

## 2021-09-22 DIAGNOSIS — I483 Typical atrial flutter: Secondary | ICD-10-CM

## 2021-09-22 DIAGNOSIS — I5042 Chronic combined systolic (congestive) and diastolic (congestive) heart failure: Secondary | ICD-10-CM

## 2021-09-22 DIAGNOSIS — I2510 Atherosclerotic heart disease of native coronary artery without angina pectoris: Secondary | ICD-10-CM | POA: Diagnosis not present

## 2021-09-22 DIAGNOSIS — Q231 Congenital insufficiency of aortic valve: Secondary | ICD-10-CM

## 2021-09-22 NOTE — Patient Instructions (Addendum)
Medication Instructions:  Your physician recommends that you continue on your current medications as directed. Please refer to the Current Medication list given to you today. *If you need a refill on your cardiac medications before your next appointment, please call your pharmacy*  Lab Work: None ordered. If you have labs (blood work) drawn today and your tests are completely normal, you will receive your results only by: [place] (if you have MyChart) OR A paper copy in the mail If you have any lab test that is abnormal or we need to change your treatment, we will call you to review the results.  Testing/Procedures: Your physician has recommended that you wear a holter monitor. Holter monitors are medical devices that record the heart's electrical activity. Doctors most often use these monitors to diagnose arrhythmias. Arrhythmias are problems with the speed or rhythm of the heartbeat. The monitor is a small, portable device. You can wear one while you do your normal daily activities. This is usually used to diagnose what is causing palpitations/syncope (passing out).  You will wear a 7 day ZIO monitor  Follow-Up: At [facility], you and your health needs are our priority.  As part of our continuing mission to provide you with exceptional heart care, we have created designated Provider Care Teams.  These Care Teams include your primary Cardiologist (physician) and Advanced Practice Providers (APPs -  Physician Assistants and Nurse Practitioners) who all work together to provide you with the care you need, when you need it.  Your next appointment:    SEE INSTRUCTION LETTER  Cardiac Ablation Cardiac ablation is a procedure to destroy, or ablate, a small amount of heart tissue in very specific places. The heart has many electrical connections. Sometimes these connections are abnormal and can cause the heart to beat very fast or irregularly. Ablating some of the areas that cause  problems can improve the heart's rhythm or return it to normal. Ablation may be done for people who: Have Wolff-Parkinson-White syndrome. Have fast heart rhythms (tachycardia). Have taken medicines for an abnormal heart rhythm (arrhythmia) that were not effective or caused side effects. Have a high-risk heartbeat that may be life-threatening. During the procedure, a small incision is made in the neck or the groin, and a long, thin tube (catheter) is inserted into the incision and moved to the heart. Small devices (electrodes) on the tip of the catheter will send out electrical currents. A type of X-ray (fluoroscopy) will be used to help guide the catheter and to provide images of the heart. Tell a health care provider about: Any allergies you have. All medicines you are taking, including vitamins, herbs, eye drops, creams, and over-the-counter medicines. Any problems you or family members have had with anesthetic medicines. Any blood disorders you have. Any surgeries you have had. Any medical conditions you have, such as kidney failure. Whether you are pregnant or may be pregnant. What are the risks? Generally, this is a safe procedure. However, problems may occur, including: Infection. Bruising and bleeding at the catheter insertion site. Bleeding into the chest, especially into the sac that surrounds the heart. This is a serious complication. Stroke or blood clots. Damage to nearby structures or organs. Allergic reaction to medicines or dyes. Need for a permanent pacemaker if the normal electrical system is damaged. A pacemaker is a small computer that sends electrical signals to the heart and helps your heart beat normally. The procedure not being fully effective. This may not be recognized until months later.  Repeat ablation procedures are sometimes done. What happens before the procedure? Medicines Ask your health care provider about: Changing or stopping your regular medicines. This  is especially important if you are taking diabetes medicines or blood thinners. Taking medicines such as aspirin and ibuprofen. These medicines can thin your blood. Do not take these medicines unless your health care provider tells you to take them. Taking over-the-counter medicines, vitamins, herbs, and supplements. General instructions Follow instructions from your health care provider about eating or drinking restrictions. Plan to have someone take you home from the hospital or clinic. If you will be going home right after the procedure, plan to have someone with you for 24 hours. Ask your health care provider what steps will be taken to prevent infection. What happens during the procedure?  An IV will be inserted into one of your veins. You will be given a medicine to help you relax (sedative). The skin on your neck or groin will be numbed. An incision will be made in your neck or your groin. A needle will be inserted through the incision and into a large vein in your neck or groin. A catheter will be inserted into the needle and moved to your heart. Dye may be injected through the catheter to help your surgeon see the area of the heart that needs treatment. Electrical currents will be sent from the catheter to ablate heart tissue in desired areas. There are three types of energy that may be used to do this: Heat (radiofrequency energy). Laser energy. Extreme cold (cryoablation). When the tissue has been ablated, the catheter will be removed. Pressure will be held on the insertion area to prevent a lot of bleeding. A bandage (dressing) will be placed over the insertion area. The exact procedure may vary among health care providers and hospitals. What happens after the procedure? Your blood pressure, heart rate, breathing rate, and blood oxygen level will be monitored until you leave the hospital or clinic. Your insertion area will be monitored for bleeding. You will need to lie still  for a few hours to ensure that you do not bleed from the insertion area. Do not drive for 24 hours or as long as told by your health care provider. Summary Cardiac ablation is a procedure to destroy, or ablate, a small amount of heart tissue using an electrical current. This procedure can improve the heart rhythm or return it to normal. Tell your health care provider about any medical conditions you may have and all medicines you are taking to treat them. This is a safe procedure, but problems may occur. Problems may include infection, bruising, damage to nearby organs or structures, or allergic reactions to medicines. Follow your health care provider's instructions about eating and drinking before the procedure. You may also be told to change or stop some of your medicines. After the procedure, do not drive for 24 hours or as long as told by your health care provider. This information is not intended to replace advice given to you by your health care provider. Make sure you discuss any questions you have with your health care provider. Document Revised: 09/14/2019 Document Reviewed: 09/14/2019 Elsevier Patient Education  [place].

## 2021-09-22 NOTE — Progress Notes (Unsigned)
Applied a 7 day Zio XT monitor to patient in the office 

## 2021-10-04 DIAGNOSIS — I483 Typical atrial flutter: Secondary | ICD-10-CM | POA: Diagnosis not present

## 2021-10-05 ENCOUNTER — Other Ambulatory Visit: Payer: Self-pay

## 2021-10-05 ENCOUNTER — Other Ambulatory Visit: Payer: BC Managed Care – PPO

## 2021-10-05 DIAGNOSIS — I483 Typical atrial flutter: Secondary | ICD-10-CM

## 2021-10-05 LAB — CBC WITH DIFFERENTIAL/PLATELET
Basophils Absolute: 0 10*3/uL (ref 0.0–0.2)
Basos: 0 %
EOS (ABSOLUTE): 0.1 10*3/uL (ref 0.0–0.4)
Eos: 2 %
Hematocrit: 51.9 % — ABNORMAL HIGH (ref 37.5–51.0)
Hemoglobin: 17.6 g/dL (ref 13.0–17.7)
Lymphocytes Absolute: 1.2 10*3/uL (ref 0.7–3.1)
Lymphs: 22 %
MCH: 28.9 pg (ref 26.6–33.0)
MCHC: 33.9 g/dL (ref 31.5–35.7)
MCV: 85 fL (ref 79–97)
Monocytes Absolute: 0.5 10*3/uL (ref 0.1–0.9)
Monocytes: 9 %
Neutrophils Absolute: 3.7 10*3/uL (ref 1.4–7.0)
Neutrophils: 67 %
Platelets: 156 10*3/uL (ref 150–450)
RBC: 6.09 x10E6/uL — ABNORMAL HIGH (ref 4.14–5.80)
RDW: 15.6 % — ABNORMAL HIGH (ref 11.6–15.4)
WBC: 5.5 10*3/uL (ref 3.4–10.8)

## 2021-10-05 LAB — BASIC METABOLIC PANEL
BUN/Creatinine Ratio: 23 (ref 10–24)
BUN: 26 mg/dL (ref 8–27)
CO2: 25 mmol/L (ref 20–29)
Calcium: 9.6 mg/dL (ref 8.6–10.2)
Chloride: 104 mmol/L (ref 96–106)
Creatinine, Ser: 1.15 mg/dL (ref 0.76–1.27)
Glucose: 175 mg/dL — ABNORMAL HIGH (ref 70–99)
Potassium: 4.5 mmol/L (ref 3.5–5.2)
Sodium: 140 mmol/L (ref 134–144)
eGFR: 68 mL/min/{1.73_m2} (ref >59)

## 2021-10-06 DIAGNOSIS — H109 Unspecified conjunctivitis: Secondary | ICD-10-CM | POA: Diagnosis not present

## 2021-10-19 ENCOUNTER — Telehealth (HOSPITAL_COMMUNITY): Payer: Self-pay | Admitting: Emergency Medicine

## 2021-10-19 NOTE — Telephone Encounter (Signed)
Reaching out to patient to offer assistance regarding upcoming cardiac imaging study; pt verbalizes understanding of appt date/time, parking situation and where to check in, pre-test NPO status and medications ordered, and verified current allergies; name and call back number provided for further questions should they arise Marchia Bond RN [facility] [phone] office [phone] cell  Arrival time 1:00p Daily meds Denies iv issues

## 2021-10-19 NOTE — Telephone Encounter (Signed)
Attempted to call patient regarding upcoming cardiac CT appointment. °Left message on voicemail with name and callback number °Jaymien Landin RN Navigator Cardiac Imaging °[facility] °[phone] Office °[phone] Cell ° °

## 2021-10-20 ENCOUNTER — Ambulatory Visit (HOSPITAL_COMMUNITY)
Admission: RE | Admit: 2021-10-20 | Discharge: 2021-10-20 | Disposition: A | Discharge status: Home / Self Care | Payer: BC Managed Care – PPO | Source: Ambulatory Visit | Attending: Cardiology | Admitting: Cardiology

## 2021-10-20 ENCOUNTER — Other Ambulatory Visit: Payer: Self-pay

## 2021-10-20 ENCOUNTER — Encounter (HOSPITAL_COMMUNITY): Payer: Self-pay

## 2021-10-20 DIAGNOSIS — I483 Typical atrial flutter: Secondary | ICD-10-CM | POA: Diagnosis not present

## 2021-10-20 MED ORDER — IOHEXOL 350 MG/ML SOLN
100.0000 mL | Freq: Once | INTRAVENOUS | Status: AC | PRN
  Administered 2021-10-20: 100 mL via INTRAVENOUS

## 2021-10-20 MED ORDER — METOPROLOL TARTRATE 5 MG/5ML IV SOLN: 5.0000 mg | INTRAVENOUS | Status: DC | PRN

## 2021-10-20 MED ORDER — METOPROLOL TARTRATE 5 MG/5ML IV SOLN
INTRAVENOUS | Status: AC
  Administered 2021-10-20: 5 mg via INTRAVENOUS
  Filled 2021-10-20: qty 10

## 2021-10-26 NOTE — Pre-Procedure Instructions (Signed)
Attempted to call patient regarding procedure instructions.  No answer 

## 2021-10-27 ENCOUNTER — Other Ambulatory Visit: Payer: Self-pay

## 2021-10-27 ENCOUNTER — Ambulatory Visit (HOSPITAL_COMMUNITY)
Admission: RE | Admit: 2021-10-27 | Discharge: 2021-10-27 | Disposition: A | Discharge status: Home / Self Care | Payer: BC Managed Care – PPO | Attending: Cardiology | Admitting: Cardiology

## 2021-10-27 ENCOUNTER — Encounter (HOSPITAL_COMMUNITY)
Admission: RE | Disposition: A | Discharge status: Home / Self Care | Payer: Self-pay | Source: Home / Self Care | Attending: Cardiology

## 2021-10-27 ENCOUNTER — Ambulatory Visit (HOSPITAL_COMMUNITY): Payer: BC Managed Care – PPO | Admitting: Certified Registered"

## 2021-10-27 ENCOUNTER — Encounter (HOSPITAL_COMMUNITY): Payer: Self-pay | Admitting: Cardiology

## 2021-10-27 DIAGNOSIS — Z7901 Long term (current) use of anticoagulants: Secondary | ICD-10-CM | POA: Insufficient documentation

## 2021-10-27 DIAGNOSIS — I483 Typical atrial flutter: Secondary | ICD-10-CM | POA: Insufficient documentation

## 2021-10-27 DIAGNOSIS — I444 Left anterior fascicular block: Secondary | ICD-10-CM | POA: Insufficient documentation

## 2021-10-27 DIAGNOSIS — I2510 Atherosclerotic heart disease of native coronary artery without angina pectoris: Secondary | ICD-10-CM | POA: Insufficient documentation

## 2021-10-27 DIAGNOSIS — Q231 Congenital insufficiency of aortic valve: Secondary | ICD-10-CM | POA: Insufficient documentation

## 2021-10-27 DIAGNOSIS — I5022 Chronic systolic (congestive) heart failure: Secondary | ICD-10-CM | POA: Diagnosis not present

## 2021-10-27 DIAGNOSIS — I4510 Unspecified right bundle-branch block: Secondary | ICD-10-CM | POA: Insufficient documentation

## 2021-10-27 DIAGNOSIS — D696 Thrombocytopenia, unspecified: Secondary | ICD-10-CM | POA: Diagnosis not present

## 2021-10-27 DIAGNOSIS — I480 Paroxysmal atrial fibrillation: Secondary | ICD-10-CM | POA: Diagnosis not present

## 2021-10-27 DIAGNOSIS — I4892 Unspecified atrial flutter: Secondary | ICD-10-CM | POA: Diagnosis not present

## 2021-10-27 DIAGNOSIS — N179 Acute kidney failure, unspecified: Secondary | ICD-10-CM | POA: Diagnosis not present

## 2021-10-27 DIAGNOSIS — E119 Type 2 diabetes mellitus without complications: Secondary | ICD-10-CM | POA: Diagnosis not present

## 2021-10-27 DIAGNOSIS — I110 Hypertensive heart disease with heart failure: Secondary | ICD-10-CM | POA: Diagnosis not present

## 2021-10-27 HISTORY — PX: A-FLUTTER ABLATION: EP1230

## 2021-10-27 LAB — GLUCOSE, CAPILLARY
Glucose-Capillary: 182 mg/dL — ABNORMAL HIGH (ref 70–99)
Glucose-Capillary: 107 mg/dL — ABNORMAL HIGH (ref 70–99)

## 2021-10-27 SURGERY — A-FLUTTER ABLATION
Anesthesia: General

## 2021-10-27 MED ORDER — RIVAROXABAN 20 MG PO TABS
20.0000 mg | ORAL_TABLET | Freq: Every day | ORAL | Status: DC
  Administered 2021-10-27: 20 mg via ORAL
  Filled 2021-10-27: qty 1

## 2021-10-27 MED ORDER — HEPARIN (PORCINE) IN NACL 2000-0.9 UNIT/L-% IV SOLN
INTRAVENOUS | Status: DC | PRN
  Administered 2021-10-27: 1000 mL

## 2021-10-27 MED ORDER — ISOPROTERENOL HCL 0.2 MG/ML IJ SOLN
INTRAMUSCULAR | Status: AC
  Filled 2021-10-27: qty 5

## 2021-10-27 MED ORDER — FENTANYL CITRATE (PF) 100 MCG/2ML IJ SOLN
INTRAMUSCULAR | Status: DC | PRN
  Administered 2021-10-27: 50 ug via INTRAVENOUS

## 2021-10-27 MED ORDER — MIDAZOLAM HCL 5 MG/5ML IJ SOLN
INTRAMUSCULAR | Status: DC | PRN
  Administered 2021-10-27: 2 mg via INTRAVENOUS

## 2021-10-27 MED ORDER — PROPOFOL 10 MG/ML IV BOLUS
INTRAVENOUS | Status: DC | PRN
  Administered 2021-10-27: 50 mg via INTRAVENOUS

## 2021-10-27 MED ORDER — DEXAMETHASONE SODIUM PHOSPHATE 10 MG/ML IJ SOLN
INTRAMUSCULAR | Status: DC | PRN
  Administered 2021-10-27: 10 mg via INTRAVENOUS

## 2021-10-27 MED ORDER — SODIUM CHLORIDE 0.9% FLUSH: 3.0000 mL | Freq: Two times a day (BID) | INTRAVENOUS | Status: DC

## 2021-10-27 MED ORDER — PHENYLEPHRINE HCL-NACL 20-0.9 MG/250ML-% IV SOLN: INTRAVENOUS | Status: DC | PRN

## 2021-10-27 MED ORDER — SODIUM CHLORIDE 0.9% FLUSH: 3.0000 mL | INTRAVENOUS | Status: DC | PRN

## 2021-10-27 MED ORDER — ONDANSETRON HCL 4 MG/2ML IJ SOLN
INTRAMUSCULAR | Status: DC | PRN
  Administered 2021-10-27: 4 mg via INTRAVENOUS

## 2021-10-27 MED ORDER — ROCURONIUM BROMIDE 100 MG/10ML IV SOLN
INTRAVENOUS | Status: DC | PRN
  Administered 2021-10-27: 50 mg via INTRAVENOUS

## 2021-10-27 MED ORDER — HEPARIN SODIUM (PORCINE) 1000 UNIT/ML IJ SOLN
INTRAMUSCULAR | Status: AC
  Filled 2021-10-27: qty 10

## 2021-10-27 MED ORDER — SUGAMMADEX SODIUM 200 MG/2ML IV SOLN
INTRAVENOUS | Status: DC | PRN
  Administered 2021-10-27: 200 mg via INTRAVENOUS

## 2021-10-27 MED ORDER — ACETAMINOPHEN 325 MG PO TABS
650.0000 mg | ORAL_TABLET | ORAL | Status: DC | PRN
  Filled 2021-10-27: qty 2

## 2021-10-27 MED ORDER — LACTATED RINGERS IV SOLN: INTRAVENOUS | Status: DC | PRN

## 2021-10-27 MED ORDER — LIDOCAINE 2% (20 MG/ML) 5 ML SYRINGE
INTRAMUSCULAR | Status: DC | PRN
  Administered 2021-10-27: 80 mg via INTRAVENOUS

## 2021-10-27 MED ORDER — PHENYLEPHRINE 40 MCG/ML (10ML) SYRINGE FOR IV PUSH (FOR BLOOD PRESSURE SUPPORT)
PREFILLED_SYRINGE | INTRAVENOUS | Status: DC | PRN
  Administered 2021-10-27 (×2): 80 ug via INTRAVENOUS

## 2021-10-27 MED ORDER — SODIUM CHLORIDE 0.9 % IV SOLN: 250.0000 mL | INTRAVENOUS | Status: DC | PRN

## 2021-10-27 MED ORDER — HEPARIN SODIUM (PORCINE) 1000 UNIT/ML IJ SOLN
INTRAMUSCULAR | Status: DC | PRN
  Administered 2021-10-27: 1000 [IU] via INTRAVENOUS

## 2021-10-27 MED ORDER — SODIUM CHLORIDE 0.9 % IV SOLN: INTRAVENOUS | Status: DC

## 2021-10-27 MED ORDER — ONDANSETRON HCL 4 MG/2ML IJ SOLN: 4.0000 mg | Freq: Four times a day (QID) | INTRAMUSCULAR | Status: DC | PRN

## 2021-10-27 MED ORDER — ISOPROTERENOL HCL 0.2 MG/ML IJ SOLN
INTRAVENOUS | Status: DC | PRN
  Administered 2021-10-27: 4 ug/min via INTRAVENOUS

## 2021-10-27 MED ORDER — PHENYLEPHRINE HCL-NACL 20-0.9 MG/250ML-% IV SOLN
INTRAVENOUS | Status: DC | PRN
  Administered 2021-10-27: 80 ug/min via INTRAVENOUS

## 2021-10-27 MED ORDER — ETOMIDATE 2 MG/ML IV SOLN
INTRAVENOUS | Status: DC | PRN
  Administered 2021-10-27: 17 mg via INTRAVENOUS

## 2021-10-27 SURGICAL SUPPLY — 14 items
CATH SMTCH THERMOCOOL SF DF (CATHETERS) ×1 IMPLANT
CATH SOUNDSTAR ECO 8FR (CATHETERS) ×1 IMPLANT
CATH WEB BI DIR CSDF CRV REPRO (CATHETERS) ×1 IMPLANT
CLOSURE PERCLOSE PROSTYLE (VASCULAR PRODUCTS) ×3 IMPLANT
MAT PREVALON FULL STRYKER (MISCELLANEOUS) IMPLANT
PACK EP LATEX FREE (CUSTOM PROCEDURE TRAY) ×2
PACK EP LF (CUSTOM PROCEDURE TRAY) ×1 IMPLANT
PAD DEFIB RADIO PHYSIO CONN (PAD) ×2 IMPLANT
PATCH CARTO3 (PAD) ×1 IMPLANT
SHEATH CARTO VIZIGO SM CVD (SHEATH) ×1 IMPLANT
SHEATH PINNACLE 8F 10CM (SHEATH) ×2 IMPLANT
SHEATH PINNACLE 9F 10CM (SHEATH) ×1 IMPLANT
SHEATH PROBE COVER 6X72 (BAG) ×1 IMPLANT
TUBING SMART ABLATE COOLFLOW (TUBING) ×1 IMPLANT

## 2021-10-27 NOTE — Anesthesia Procedure Notes (Signed)
Procedure Name: Intubation Date/Time: 10/27/2021 2:37 PM Performed by: Josephine Igo, CRNA Pre-anesthesia Checklist: Patient identified, Emergency Drugs available, Suction available, Patient being monitored and Timeout performed Patient Re-evaluated:Patient Re-evaluated prior to induction Oxygen Delivery Method: Circle system utilized Preoxygenation: Pre-oxygenation with 100% oxygen Induction Type: IV induction Ventilation: Mask ventilation without difficulty Laryngoscope Size: Mac and 4 Grade View: Grade I Tube type: Oral Tube size: 7.5 mm Number of attempts: 1 Airway Equipment and Method: Stylet Placement Confirmation: ETT inserted through vocal cords under direct vision Secured at: 22 cm Tube secured with: Tape Dental Injury: Injury to lip  Comments: Intubation by [employer] CRNA

## 2021-10-27 NOTE — Anesthesia Preprocedure Evaluation (Addendum)
Anesthesia Evaluation  Patient identified by MRN, date of birth, ID band Patient awake    Reviewed: Allergy & Precautions, NPO status , Patient's Chart, lab work & pertinent test results  History of Anesthesia Complications (+) POST - OP SPINAL HEADACHE  Airway Mallampati: II  TM Distance: >3 FB     Dental   Pulmonary sleep apnea , former smoker,    breath sounds clear to auscultation       Cardiovascular hypertension, + CAD  + dysrhythmias Atrial Fibrillation + Valvular Problems/Murmurs AS  Rhythm:Irregular Rate:Normal     Neuro/Psych  Neuromuscular disease    GI/Hepatic negative GI ROS, Neg liver ROS,   Endo/Other  diabetes  Renal/GU Renal disease     Musculoskeletal  (+) Arthritis ,   Abdominal   Peds  Hematology   Anesthesia Other Findings   Reproductive/Obstetrics                           Anesthesia Physical Anesthesia Plan  ASA: 3  Anesthesia Plan: General   Post-op Pain Management:    Induction: Intravenous  PONV Risk Score and Plan: 2 and Ondansetron, Dexamethasone and Midazolam  Airway Management Planned:   Additional Equipment:   Intra-op Plan:   Post-operative Plan: Extubation in OR  Informed Consent: I have reviewed the patients History and Physical, chart, labs and discussed the procedure including the risks, benefits and alternatives for the proposed anesthesia with the patient or authorized representative who has indicated his/her understanding and acceptance.     Dental advisory given  Plan Discussed with: Anesthesiologist and CRNA  Anesthesia Plan Comments:        Anesthesia Quick Evaluation

## 2021-10-27 NOTE — Transfer of Care (Signed)
Immediate Anesthesia Transfer of Care Note  Patient: Matthew Schmitt  Procedure(s) Performed: A-FLUTTER ABLATION  Patient Location: PACU  Anesthesia Type:General  Level of Consciousness: drowsy and patient cooperative  Airway & Oxygen Therapy: Patient Spontanous Breathing and Patient connected to nasal cannula oxygen  Post-op Assessment: Report given to RN and Post -op Vital signs reviewed and stable  Post vital signs: Reviewed and stable  Last Vitals:  Vitals Value Taken Time  BP 150/89 10/27/21 1552  Temp    Pulse 74 10/27/21 1553  Resp 18 10/27/21 1553  SpO2 97 % 10/27/21 1553  Vitals shown include unvalidated device data.  Last Pain:  Vitals:   10/27/21 1108  TempSrc:   PainSc: 2       Patients Stated Pain Goal: 2 (71/59/53 [id number])  Complications: No notable events documented.

## 2021-10-27 NOTE — Discharge Instructions (Addendum)
Post procedure care instructions No driving for 4 days. No lifting over 5 lbs for 1 week. No vigorous or sexual activity for 1 week. You may return to work/your usual activities on 11/04/21. Keep procedure site clean & dry. If you notice increased pain, swelling, bleeding or pus, call/return!  You may shower after 24 hours, but no soaking in baths/hot tubs/pools for 1 week.    Cardiac Ablation, Care After  This sheet gives you information about how to care for yourself after your procedure. Your health care provider may also give you more specific instructions. If you have problems or questions, contact your health care provider. What can I expect after the procedure? After the procedure, it is common to have: Bruising around your puncture site. Tenderness around your puncture site. Skipped heartbeats. Tiredness (fatigue).  Follow these instructions at home: Puncture site care  Follow instructions from your health care provider about how to take care of your puncture site. Make sure you: If present, leave stitches (sutures), skin glue, or adhesive strips in place. These skin closures may need to stay in place for up to 2 weeks. If adhesive strip edges start to loosen and curl up, you may trim the loose edges. Do not remove adhesive strips completely unless your health care provider tells you to do that. If a large square bandage is present, this may be removed 24 hours after surgery.  Check your puncture site every day for signs of infection. Check for: Redness, swelling, or pain. Fluid or blood. If your puncture site starts to bleed, lie down on your back, apply firm pressure to the area, and contact your health care provider. Warmth. Pus or a bad smell. Driving Do not drive for at least 4 days after your procedure or however long your health care provider recommends. (Do not resume driving if you have previously been instructed not to drive for other health reasons.) Do not drive or use  heavy machinery while taking prescription pain medicine. Activity Avoid activities that take a lot of effort for at least 7 days after your procedure. Do not lift anything that is heavier than 5 lb (4.5 kg) for one week.  No sexual activity for 1 week.  Return to your normal activities as told by your health care provider. Ask your health care provider what activities are safe for you. General instructions Take over-the-counter and prescription medicines only as told by your health care provider. Do not use any products that contain nicotine or tobacco, such as cigarettes and e-cigarettes. If you need help quitting, ask your health care provider. You may shower after 24 hours, but Do not take baths, swim, or use a hot tub for 1 week.  Do not drink alcohol for 24 hours after your procedure. Keep all follow-up visits as told by your health care provider. This is important. Contact a health care provider if: You have redness, mild swelling, or pain around your puncture site. You have fluid or blood coming from your puncture site that stops after applying firm pressure to the area. Your puncture site feels warm to the touch. You have pus or a bad smell coming from your puncture site. You have a fever. You have chest pain or discomfort that spreads to your neck, jaw, or arm. You are sweating a lot. You feel nauseous. You have a fast or irregular heartbeat. You have shortness of breath. You are dizzy or light-headed and feel the need to lie down. You have pain or numbness  in the arm or leg closest to your puncture site. Get help right away if: Your puncture site suddenly swells. Your puncture site is bleeding and the bleeding does not stop after applying firm pressure to the area. These symptoms may represent a serious problem that is an emergency. Do not wait to see if the symptoms will go away. Get medical help right away. Call your local emergency services (911 in the [place] Do not drive  yourself to the hospital. Summary After the procedure, it is normal to have bruising and tenderness at the puncture site in your groin, neck, or forearm. Check your puncture site every day for signs of infection. Get help right away if your puncture site is bleeding and the bleeding does not stop after applying firm pressure to the area. This is a medical emergency. This information is not intended to replace advice given to you by your health care provider. Make sure you discuss any questions you have with your health care provider.

## 2021-10-27 NOTE — Anesthesia Postprocedure Evaluation (Signed)
Anesthesia Post Note  Patient: Matthew Schmitt  Procedure(s) Performed: A-FLUTTER ABLATION     Patient location during evaluation: PACU Anesthesia Type: General Level of consciousness: awake Pain management: pain level controlled Vital Signs Assessment: post-procedure vital signs reviewed and stable Respiratory status: spontaneous breathing Cardiovascular status: stable Anesthetic complications: no   No notable events documented.  Last Vitals:  Vitals:   10/27/21 1605 10/27/21 1610  BP: 138/78 135/80  Pulse: 71 69  Resp: 18 14  Temp:    SpO2: 93% 94%    Last Pain:  Vitals:   10/27/21 1553  TempSrc: Temporal  PainSc: 0-No pain                 Martiza Speth

## 2021-10-27 NOTE — H&P (Signed)
Electrophysiology Office Note:     Date:  09/22/2021    ID:  Matthew Schmitt, DOB [birth year], MRN [id number]   PCP:  Leonides Sake, MD         [facility] Cardiologist:  Candee Furbish, MD  [facility] Electrophysiologist:  Vickie Epley, MD    Referring MD: Jerline Pain, MD    Chief Complaint: Atrial fibrillation   History of Present Illness:     Matthew Schmitt is a 70 y.o. male who presents for an evaluation of atrial fibrillation at the request of Dr. Marlou Porch. Their medical history includes BPH, coronary artery disease, diabetes, hypertension, right bundle branch block and left anterior fascicular block.  The patient last saw Dr. Marlou Porch August 18, 2021.  The patient has an ischemic cardiomyopathy with an ejection fraction of around 35% that is followed by Dr. Marlou Porch.  He has been in persistent atrial flutter despite treatment with amiodarone.       Objective        Past Medical History:  Diagnosis Date   Anxiety     Arthritis      everywhere   Arthrosis of right acromioclavicular joint 01/29/2020   Atrial flutter [facility])      Onset May 2014 Cardioversion done    BPH associated with nocturia     BPH with obstruction/lower urinary tract symptoms 01/04/2017   CAD (coronary artery disease) 04/02/2013    Cath 2012 moderate mid LAD lesion, otherwise not much disease    Coronary artery disease      cardiologist-  dr Bettina Gavia (previouly dr Wynonia Lawman)   Diabetes [facility]) 01/18/2018   Encounter for long-term (current) use of other medications 07/27/2013   Essential hypertension, benign     First degree heart block     Heart murmur     History of bilateral knee replacement 02/07/2017   History of bladder stone     History of kidney stones     History of melanoma excision      June 2016  --- s/p  excision right  leg (per pt localized and no recurrence)   History of renal cell carcinoma 01/2017    s/p  left partial nephrecotmy   Hypertensive heart disease 04/02/2013   Impingement  syndrome of right shoulder 01/29/2020   Impotence of organic origin     Left-sided low back pain with left-sided sciatica 01/23/2019   Long term current use of anticoagulant therapy     Mixed hyperlipidemia      slightly   Multiple renal cysts      bilateral   Nephrolithiasis 05/17/2014   Nonischemic cardiomyopathy (HCC)     Osteoarthritis     PAF (paroxysmal atrial fibrillation) ([place])      followed by cardiology  (takes ASA)   RBBB     RBBB (right bundle branch block with left anterior fascicular block) 04/02/2013   Right ureteral stone     Rotator cuff syndrome of left shoulder 04/17/2016   Status post total left knee replacement 01/01/2020   Tendinopathy of right biceps tendon 01/29/2020   Tendinopathy of right rotator cuff 01/29/2020   Type 2 diabetes mellitus treated with insulin [facility])      endocrinologist-  dr Loanne Drilling   Wears glasses             Past Surgical History:  Procedure Laterality Date   CARDIAC CATHETERIZATION   08-17-2011  DR Tollie Eth    POSSIBLE MODERATE LAD STENOSIS JUST AFTER  THE BIFURCATION OF LARGE DIAGONAL BRANCH/ LVEF  40-45%/ MILD GLOBAL HYPODINESIS (CATH DONE FOR ABNORMAL STRESS TEST OF FIXED LATERAL WALL DEFECT & BIFASCICULAR BLAOCK)   CARDIOVERSION N/A 04/02/2013    Procedure: CARDIOVERSION;  Surgeon: Jacolyn Reedy, MD;  Location: [place];  Service: Cardiovascular;  Laterality: N/A;   CARDIOVERSION N/A 12/27/2020    Procedure: CARDIOVERSION;  Surgeon: Skeet Latch, MD;  Location: [place];  Service: Cardiovascular;  Laterality: N/A;   CYSTOSCOPY WITH LITHOLAPAXY N/A 05/18/2015    Procedure: CYSTOSCOPY WITH LITHOLAPAXY;  Surgeon: Rana Snare, MD;  Location: [facility];  Service: Urology;  Laterality: N/A;   CYSTOSCOPY WITH LITHOLAPAXY N/A 01/04/2017    Procedure: CYSTOSCOPY WITH LITHOLAPAXY, Holmium Laser;  Surgeon: Ardis Hughs, MD;  Location: [facility];  Service: Urology;  Laterality: N/A;   CYSTOSCOPY WITH RETROGRADE PYELOGRAM,  URETEROSCOPY AND STENT PLACEMENT   11/03/2012    Procedure: CYSTOSCOPY WITH RETROGRADE PYELOGRAM, URETEROSCOPY AND STENT PLACEMENT;  Surgeon: Bernestine Amass, MD;  Location: [facility];  Service: Urology;  Laterality: Left;  Cystoscopy/Left Retrograde/Ureteroscopy/Holmium Laser Litho/Double J Stent   CYSTOSCOPY WITH URETEROSCOPY, STONE BASKETRY AND STENT PLACEMENT Right 05/15/2019    Procedure: CYSTOSCOPY WITH URETEROSCOPY, LASER LITHOTRIPSY STONE BASKETRY AND STENT PLACEMENT, RIGHT RETROGRADE;  Surgeon: Ardis Hughs, MD;  Location: [facility];  Service: Urology;  Laterality: Right;   HOLMIUM LASER APPLICATION   35/45/6256    Procedure: HOLMIUM LASER APPLICATION;  Surgeon: Bernestine Amass, MD;  Location: [facility];  Service: Urology;  Laterality: Left;   HOLMIUM LASER APPLICATION N/A 3/89/3734    Procedure: HOLMIUM LASER APPLICATION;  Surgeon: Rana Snare, MD;  Location: [facility];  Service: Urology;  Laterality: N/A;   KNEE SURGERY Bilateral [place] Bilateral June 2016    20th-- left leg //  1st-- right leg w/ skin graft from right thigh (no lymph node bx)   NEPHROLITHOTOMY Right 05/17/2014    Procedure: NEPHROLITHOTOMY PERCUTANEOUS;  Surgeon: Bernestine Amass, MD;  Location: [facility];  Service: Urology;  Laterality: Right;   PILONIDAL CYST EXCISION       ROBOTIC ASSITED PARTIAL NEPHRECTOMY Left 06/18/2013    Procedure: ROBOTIC ASSISTED PARTIAL NEPHRECTOMY;  Surgeon: Dutch Gray, MD;  Location: [facility];  Service: Urology;  Laterality: Left;   ROTATOR CUFF REPAIR Left 07/2016   SHOULDER ARTHROSCOPY WITH ROTATOR CUFF REPAIR AND SUBACROMIAL DECOMPRESSION Right 05/27/2020    Procedure: RIGHT SHOULDER ARTHROSCOPY WITH EXTENSIVE DEBRIDEMENT, DISTAL CLAVICLE EXCISION AND SUBACROMIAL DECOMPRESSION;  Surgeon: Leandrew Koyanagi, MD;  Location: [place];  Service: Orthopedics;  Laterality: Right;   TEE WITH  CARDIOVERSION   12/27/2020   TEE WITHOUT CARDIOVERSION N/A 12/27/2020    Procedure: TRANSESOPHAGEAL ECHOCARDIOGRAM (TEE);  Surgeon: Skeet Latch, MD;  [facility];  Service: Cardiovascular;  Laterality: N/A;   TONSILLECTOMY   3  (age 53)   TOTAL KNEE ARTHROPLASTY Left 02/07/2017    Procedure: LEFT TOTAL KNEE ARTHROPLASTY;  Surgeon: Leandrew Koyanagi, MD;  Location: [place];  Service: Orthopedics;  Laterality: Left;   TOTAL KNEE ARTHROPLASTY Right 04/25/2017    Procedure: RIGHT TOTAL KNEE ARTHROPLASTY;  Surgeon: Leandrew Koyanagi, MD;  Location: [place];  Service: Orthopedics;  Laterality: Right;   TRANSURETHRAL RESECTION OF PROSTATE N/A 01/04/2017    Procedure: TRANSURETHRAL RESECTION OF THE PROSTATE (TURP);  Surgeon: Ardis Hughs, MD;  [facility];  Service: Urology;  Laterality: N/A;      Current Medications: Active Medications      Current Meds  Medication Sig   amiodarone (PACERONE) 200 MG tablet Take 200mg  (1 tablet) twice daily for 2 weeks. Then on 01/12/2021, reduce dose to 200mg  once daily.   dapagliflozin propanediol (FARXIGA) 10 MG TABS tablet Take 1 tablet (10 mg total) by mouth daily before breakfast.   Insulin Glargine (BASAGLAR KWIKPEN) 100 UNIT/ML Inject 60 Units into the skin every morning. And pen needles 1/day   Insulin Pen Needle (PEN NEEDLES) 30G X 8 MM MISC 1 each by Does not apply route daily. E11.9   Lancets (ONETOUCH DELICA PLUS [id number]) MISC 1 each by Other route 2 (two) times daily. E11.9   metoprolol succinate (TOPROL-XL) 50 MG 24 hr tablet TAKE 1 TABLET (50 MG TOTAL) BY MOUTH DAILY. TAKE WITH OR IMMEDIATELY FOLLOWING A MEAL.   nitroGLYCERIN (NITROSTAT) 0.4 MG SL tablet Place 1 tablet (0.4 mg total) under the tongue every 5 (five) minutes as needed for chest pain.   rivaroxaban (XARELTO) 20 MG TABS tablet Take 1 tablet (20 mg total) by mouth daily with supper.   rosuvastatin (CRESTOR) 20 MG tablet Take 1 tablet (20 mg total) by mouth daily.    sacubitril-valsartan (ENTRESTO) 49-51 MG TAKE 1 TABLET BY MOUTH TWO TIMES DAILY. (Patient taking differently: Take 1 tablet by mouth daily.)   spironolactone (ALDACTONE) 25 MG tablet TAKE 1/2 TABLET (12.5 MG TOTAL) BY MOUTH DAILY. (Patient taking differently: Take 25 mg by mouth every other day.)   trolamine salicylate (ASPERCREME) 10 % cream Apply 1 application topically as needed for muscle pain.        Allergies:   Actos [pioglitazone]    Social History         Socioeconomic History   Marital status: Married      Spouse name: Not on file   Number of children: 2   Years of education: 16   Highest education level: Not on file  Occupational History   Occupation: Government social research officer, Psychologist, occupational  Tobacco Use   Smoking status: Former      Packs/day: 1.00      Years: 6.00      Pack years: 6.00      Types: Cigarettes      Quit date: 10/31/1969      Years since quitting: 51.9   Smokeless tobacco: Never  Vaping Use   Vaping Use: Never used  Substance and Sexual Activity   Alcohol use: Yes      Alcohol/week: 0.0 standard drinks      Comment: OCCASIONAL   Drug use: No   Sexual activity: Never  Other Topics Concern   Not on file  Social History Narrative    Regular exercise-no    Social Determinants of Health    Financial Resource Strain: Not on file  Food Insecurity: Not on file  Transportation Needs: Not on file  Physical Activity: Not on file  Stress: Not on file  Social Connections: Not on file      Family History: The patient's family history includes Arthritis in his father and mother; Cancer in his maternal uncle and paternal uncle; Diabetes in his brother and father; Heart attack (age of onset: 68) in his brother; Hypertension in his father and mother; Lymphoma in his paternal grandfather.   ROS:   Please see the history of present illness.    All other systems reviewed and are negative.   EKGs/Labs/Other Studies Reviewed:  The following studies were  reviewed today:   May 10, 2021 echo Left ventricular function moderately decreased, 30 to 35% Right ventricular function mildly reduced Mildly dilated left atrium Mild to moderate MR Mild to moderate AI Mild to moderate AAS Mildly dilated aortic root  December 27, 2020 transesophageal echo Bicuspid aortic valve   August 18, 2021 ECG Right bundle branch block Typical appearing atrial flutter with variable AV conduction Left anterior fascicular block  December 27, 2020 EKG shows atrial flutter with 2 1 AV conduction, right bundle branch block, left anterior fascicular block   EKGs between 2022 and 2014 all show sinus rhythm or atrial flutter.  Apr 02, 2013 EKG shows atrial flutter with 2-1 AV conduction, right bundle branch block and a left anterior fascicular block  EKGs before 2014 dating back to the earliest EKG in our record (November 03, 2012) shows sinus rhythm   EKG:  The ekg ordered today demonstrates atrial flutter, right bundle branch block, left anterior fascicular block     Recent Labs: 12/19/2020: NT-Pro BNP 3,563 12/26/2020: B Natriuretic Peptide 412.0 08/18/2021: ALT 20; BUN 22; Creatinine, Ser 1.21; Hemoglobin 17.5; Platelets 162; Potassium 4.2; Sodium 144; TSH 0.469  Recent Lipid Panel Labs (Brief)          Component Value Date/Time    CHOL 149 12/19/2020 1656    TRIG 165 (H) 12/19/2020 1656    HDL 42 12/19/2020 1656    CHOLHDL 3.5 12/19/2020 1656    CHOLHDL 4.2 07/20/2014 0822    VLDL 26 07/20/2014 0822    LDLCALC 79 12/19/2020 1656        Physical Exam:     VS:  BP 138/86   Pulse 72   Ht 5\' 11"  (1.803 m)   Wt 227 lb (103 kg)   SpO2 97%   BMI 31.66 kg/m         Wt Readings from Last 3 Encounters:  09/22/21 227 lb (103 kg)  08/18/21 226 lb (102.5 kg)  08/04/21 224 lb 3.2 oz (101.7 kg)      GEN:  Well nourished, well developed in no acute distress HEENT: Normal NECK: No JVD; No carotid bruits LYMPHATICS: No lymphadenopathy CARDIAC:  RRR, no murmurs, rubs, gallops RESPIRATORY:  Clear to auscultation without rales, wheezing or rhonchi  ABDOMEN: Soft, non-tender, non-distended MUSCULOSKELETAL:  No edema; No deformity  SKIN: Warm and dry NEUROLOGIC:  Alert and oriented x 3 PSYCHIATRIC:  Normal affect          Assessment     ASSESSMENT:     1. Typical atrial flutter ([place])   2. RBBB (right bundle branch block with left anterior fascicular block)   3. Coronary artery disease involving native coronary artery of native heart without angina pectoris   4. Chronic combined systolic and diastolic CHF (congestive heart failure) ([place])   5. Bicuspid aortic valve     PLAN:     In order of problems listed above:   #Typical atrial flutter Rhythm control is indicated given chronic systolic heart failure.  We discussed treatment options for his atrial flutter including catheter ablation and antiarrhythmic drugs.  He is on amiodarone but this has not been effective at maintaining normal rhythm.  I would recommend catheter ablation at this time.  We discussed the procedure in detail including the risk, recovery and efficacy and he wishes to proceed.  I will also get a CT scan prior to the procedure to assess his right atrial and left atrial anatomy.  I will have him wear a 1 week ZIO monitor to exclude atrial fibrillation as this would obviously change the ablation approach.  We did discuss the A. fib ablation during today's visit in case that is necessary.   He will continue taking Xarelto for stroke prophylaxis.     Risk, benefits, and alternatives to EP study and radiofrequency ablation for afib/flutter were also discussed in detail today. These risks include but are not limited to stroke, bleeding, vascular damage, tamponade, perforation, damage to the esophagus, lungs, and other structures, pulmonary vein stenosis, worsening renal function, and death. The patient understands these risk and wishes to proceed.  We will therefore  proceed with catheter ablation at the next available time.  Carto, ICE, anesthesia are requested for the procedure.  Will also obtain CT PV protocol prior to the procedure to exclude LAA thrombus and further evaluate atrial anatomy.     #Chronic systolic heart failure NYHA class II.  Warm and dry on exam today.  On good medical therapy.  We will need to reassess his left ventricular function after the typical atrial flutter ablation.  If he has persistently reduced ejection fraction, we will need to discuss defibrillator therapy.   #Right bundle branch block and left anterior fascicular block We discussed his baseline conduction disease during today's appointment.  We discussed the possibility that he would require pacemaker as he ages.       Total time spent with patient today 65 minutes. This includes reviewing records, evaluating the patient and coordinating care.   Medication Adjustments/Labs and Tests Ordered: Current medicines are reviewed at length with the patient today.  Concerns regarding medicines are outlined above.     Orders Placed This Encounter  Procedures   CT CARDIAC MORPH/PULM VEIN W/CM&W/O CA SCORE   Basic Metabolic Panel (BMET)   CBC w/Diff   LONG TERM MONITOR (3-14 DAYS)   EKG 12-Lead    No orders of the defined types were placed in this encounter.       Signed, Hilton Cork. Quentin Ore, MD, [facility], [facility] 09/22/2021 8:35 AM    Electrophysiology [facility]       -----------------------------------  I have seen, examined the patient, and reviewed the above assessment and plan.    Plan for CTI ablation.    Vickie Epley, MD 10/27/2021 10:47 AM

## 2021-10-30 ENCOUNTER — Encounter (HOSPITAL_COMMUNITY): Payer: Self-pay | Admitting: Cardiology

## 2021-12-05 NOTE — Progress Notes (Deleted)
Electrophysiology Office Follow up Visit Note:    Date:  12/05/2021   ID:  Matthew Schmitt, DOB July 29, 1951, MRN [id number]  PCP:  Leonides Sake, MD  [place] Cardiologist:  Candee Furbish, MD  [facility] Electrophysiologist:  Vickie Epley, MD    Interval History:    Matthew Schmitt is a 71 y.o. male who presents for a follow up visit.  He underwent a successful atrial flutter ablation on October 27, 2021.  He also has a history of chronic systolic heart failure that was thought to be at least in part due to tachycardia mediated cardiomyopathy.       Past Medical History:  Diagnosis Date   Anxiety    Arthritis    everywhere   Arthrosis of right acromioclavicular joint 01/29/2020   Atrial flutter [facility])    Onset May 2014 Cardioversion done    BPH associated with nocturia    BPH with obstruction/lower urinary tract symptoms 01/04/2017   CAD (coronary artery disease) 04/02/2013   Cath 2012 moderate mid LAD lesion, otherwise not much disease    Coronary artery disease    cardiologist-  dr Bettina Gavia (previouly dr Wynonia Lawman)   Diabetes [facility]) 01/18/2018   Encounter for long-term (current) use of other medications 07/27/2013   Essential hypertension, benign    First degree heart block    Heart murmur    History of bilateral knee replacement 02/07/2017   History of bladder stone    History of kidney stones    History of melanoma excision    June 2016  --- s/p  excision right  leg (per pt localized and no recurrence)   History of renal cell carcinoma 01/2017   s/p  left partial nephrecotmy   Hypertensive heart disease 04/02/2013   Impingement syndrome of right shoulder 01/29/2020   Impotence of organic origin    Left-sided low back pain with left-sided sciatica 01/23/2019   Long term current use of anticoagulant therapy    Mixed hyperlipidemia    slightly   Multiple renal cysts    bilateral   Nephrolithiasis 05/17/2014   Nonischemic cardiomyopathy (HCC)    Osteoarthritis    PAF  (paroxysmal atrial fibrillation) ([place])    followed by cardiology  (takes ASA)   RBBB    RBBB (right bundle branch block with left anterior fascicular block) 04/02/2013   Right ureteral stone    Rotator cuff syndrome of left shoulder 04/17/2016   Status post total left knee replacement 01/01/2020   Tendinopathy of right biceps tendon 01/29/2020   Tendinopathy of right rotator cuff 01/29/2020   Type 2 diabetes mellitus treated with insulin [facility])    endocrinologist-  dr Loanne Drilling   Wears glasses     Past Surgical History:  Procedure Laterality Date   A-FLUTTER ABLATION N/A 10/27/2021   Procedure: A-FLUTTER ABLATION;  Surgeon: Vickie Epley, MD;  Location: [place] [facility];  Service: Cardiovascular;  Laterality: N/A;   CARDIAC CATHETERIZATION  08-17-2011  DR Frederico Hamman [place]   POSSIBLE MODERATE LAD STENOSIS JUST AFTER THE BIFURCATION OF LARGE DIAGONAL BRANCH/ LVEF  40-45%/ MILD GLOBAL HYPODINESIS (CATH DONE FOR ABNORMAL STRESS TEST OF FIXED LATERAL WALL DEFECT & BIFASCICULAR BLAOCK)   CARDIOVERSION N/A 04/02/2013   Procedure: CARDIOVERSION;  Surgeon: Jacolyn Reedy, MD;  Location: [place];  Service: Cardiovascular;  Laterality: N/A;   CARDIOVERSION N/A 12/27/2020   Procedure: CARDIOVERSION;  Surgeon: Skeet Latch, MD;  Location: [place];  Service: Cardiovascular;  Laterality: N/A;  CYSTOSCOPY WITH LITHOLAPAXY N/A 05/18/2015   Procedure: CYSTOSCOPY WITH LITHOLAPAXY;  Surgeon: Rana Snare, MD;  Location: [facility];  Service: Urology;  Laterality: N/A;   CYSTOSCOPY WITH LITHOLAPAXY N/A 01/04/2017   Procedure: CYSTOSCOPY WITH LITHOLAPAXY, Holmium Laser;  Surgeon: Ardis Hughs, MD;  Location: [facility];  Service: Urology;  Laterality: N/A;   CYSTOSCOPY WITH RETROGRADE PYELOGRAM, URETEROSCOPY AND STENT PLACEMENT  11/03/2012   Procedure: CYSTOSCOPY WITH RETROGRADE PYELOGRAM, URETEROSCOPY AND STENT PLACEMENT;  Surgeon: Bernestine Amass, MD;  Location: [facility];  Service: Urology;  Laterality: Left;  Cystoscopy/Left Retrograde/Ureteroscopy/Holmium Laser Litho/Double J Stent   CYSTOSCOPY WITH URETEROSCOPY, STONE BASKETRY AND STENT PLACEMENT Right 05/15/2019   Procedure: CYSTOSCOPY WITH URETEROSCOPY, LASER LITHOTRIPSY STONE BASKETRY AND STENT PLACEMENT, RIGHT RETROGRADE;  Surgeon: Ardis Hughs, MD;  Location: [facility];  Service: Urology;  Laterality: Right;   HOLMIUM LASER APPLICATION  89/[birth year]/1941   Procedure: HOLMIUM LASER APPLICATION;  Surgeon: Bernestine Amass, MD;  Location: [facility];  Service: Urology;  Laterality: Left;   HOLMIUM LASER APPLICATION N/A 7/40/8144   Procedure: HOLMIUM LASER APPLICATION;  Surgeon: Rana Snare, MD;  Location: [facility];  Service: Urology;  Laterality: N/A;   KNEE SURGERY Bilateral [place] Bilateral June 2016   20th-- left leg //  1st-- right leg w/ skin graft from right thigh (no lymph node bx)   NEPHROLITHOTOMY Right 05/17/2014   Procedure: NEPHROLITHOTOMY PERCUTANEOUS;  Surgeon: Bernestine Amass, MD;  Location: [facility];  Service: Urology;  Laterality: Right;   PILONIDAL CYST EXCISION     ROBOTIC ASSITED PARTIAL NEPHRECTOMY Left 06/18/2013   Procedure: ROBOTIC ASSISTED PARTIAL NEPHRECTOMY;  Surgeon: Dutch Gray, MD;  Location: [facility];  Service: Urology;  Laterality: Left;   ROTATOR CUFF REPAIR Left 07/2016   SHOULDER ARTHROSCOPY WITH ROTATOR CUFF REPAIR AND SUBACROMIAL DECOMPRESSION Right 05/27/2020   Procedure: RIGHT SHOULDER ARTHROSCOPY WITH EXTENSIVE DEBRIDEMENT, DISTAL CLAVICLE EXCISION AND SUBACROMIAL DECOMPRESSION;  Surgeon: Leandrew Koyanagi, MD;  Location: [place];  Service: Orthopedics;  Laterality: Right;   TEE WITH CARDIOVERSION  12/27/2020   TEE WITHOUT CARDIOVERSION N/A 12/27/2020   Procedure: TRANSESOPHAGEAL ECHOCARDIOGRAM (TEE);  Surgeon: Skeet Latch, MD;  Location: [facility];  Service: Cardiovascular;   Laterality: N/A;   TONSILLECTOMY  36  (age 74)   TOTAL KNEE ARTHROPLASTY Left 02/07/2017   Procedure: LEFT TOTAL KNEE ARTHROPLASTY;  Surgeon: Leandrew Koyanagi, MD;  Location: [place];  Service: Orthopedics;  Laterality: Left;   TOTAL KNEE ARTHROPLASTY Right 04/25/2017   Procedure: RIGHT TOTAL KNEE ARTHROPLASTY;  Surgeon: Leandrew Koyanagi, MD;  Location: [place];  Service: Orthopedics;  Laterality: Right;   TRANSURETHRAL RESECTION OF PROSTATE N/A 01/04/2017   Procedure: TRANSURETHRAL RESECTION OF THE PROSTATE (TURP);  Surgeon: Ardis Hughs, MD;  Location: [facility];  Service: Urology;  Laterality: N/A;    Current Medications: No outpatient medications have been marked as taking for the 12/06/21 encounter (Appointment) with Vickie Epley, MD.     Allergies:   Actos [pioglitazone]   Social History   Socioeconomic History   Marital status: Married    Spouse name: Not on file   Number of children: 2   Years of education: 16   Highest education level: Not on file  Occupational History   Occupation: Government social research officer, Psychologist, occupational  Tobacco Use   Smoking status: Former    Packs/day: 1.00  Years: 6.00    Pack years: 6.00    Types: Cigarettes    Quit date: 10/31/1969    Years since quitting: 52.1   Smokeless tobacco: Never  Vaping Use   Vaping Use: Never used  Substance and Sexual Activity   Alcohol use: Yes    Alcohol/week: 0.0 standard drinks    Comment: OCCASIONAL   Drug use: No   Sexual activity: Never  Other Topics Concern   Not on file  Social History Narrative   Regular exercise-no   Social Determinants of Health   Financial Resource Strain: Not on file  Food Insecurity: Not on file  Transportation Needs: Not on file  Physical Activity: Not on file  Stress: Not on file  Social Connections: Not on file     Family History: The patient's family history includes Arthritis in his father and mother; Cancer in his maternal uncle and paternal uncle; Diabetes in his  brother and father; Heart attack (age of onset: 14) in his brother; Hypertension in his father and mother; Lymphoma in his paternal grandfather.  ROS:   Please see the history of present illness.    All other systems reviewed and are negative.  EKGs/Labs/Other Studies Reviewed:    The following studies were reviewed today:  Abl[facility] records  May 10, 2021 echo Left ventricular function moderately decreased, 30% Right ventricular function mildly reduced   EKG:  The ekg ordered today demonstrates ***  Recent Labs: 12/19/2020: NT-Pro BNP 3,563 12/26/2020: B Natriuretic Peptide 412.0 08/18/2021: ALT 20; TSH 0.469 10/05/2021: BUN 26; Creatinine, Ser 1.15; Hemoglobin 17.6; Platelets 156; Potassium 4.5; Sodium 140  Recent Lipid Panel    Component Value Date/Time   CHOL 149 12/19/2020 1656   TRIG 165 (H) 12/19/2020 1656   HDL 42 12/19/2020 1656   CHOLHDL 3.5 12/19/2020 1656   CHOLHDL 4.2 07/20/2014 0822   VLDL 26 07/20/2014 0822   LDLCALC 79 12/19/2020 1656    Physical Exam:    VS:  There were no vitals taken for this visit.    Wt Readings from Last 3 Encounters:  10/27/21 225 lb (102.1 kg)  09/22/21 227 lb (103 kg)  08/18/21 226 lb (102.5 kg)     GEN: *** Well nourished, well developed in no acute distress HEENT: Normal NECK: No JVD; No carotid bruits LYMPHATICS: No lymphadenopathy CARDIAC: ***RRR, no murmurs, rubs, gallops RESPIRATORY:  Clear to auscultation without rales, wheezing or rhonchi  ABDOMEN: Soft, non-tender, non-distended MUSCULOSKELETAL:  No edema; No deformity  SKIN: Warm and dry NEUROLOGIC:  Alert and oriented x 3 PSYCHIATRIC:  Normal affect        ASSESSMENT:    No diagnosis found. PLAN:    In order of problems listed above:    Needs echo ordered  Virtual visit to review echo results and determine need for ICD      Total time spent with patient today *** minutes. This includes reviewing records, evaluating the patient and  coordinating care.   Medication Adjustments/Labs and Tests Ordered: Current medicines are reviewed at length with the patient today.  Concerns regarding medicines are outlined above.  No orders of the defined types were placed in this encounter.  No orders of the defined types were placed in this encounter.    Signed, Lars Mage, MD, [facility], [facility] 12/05/2021 9:48 PM    Electrophysiology  [facility]

## 2021-12-06 ENCOUNTER — Encounter: Payer: Self-pay | Admitting: Cardiology

## 2021-12-06 ENCOUNTER — Ambulatory Visit: Payer: BC Managed Care – PPO | Admitting: Cardiology

## 2021-12-06 ENCOUNTER — Other Ambulatory Visit: Payer: Self-pay

## 2021-12-06 VITALS — BP 120/74 | HR 62 | Ht 71.0 in | Wt 230.6 lb

## 2021-12-06 DIAGNOSIS — I5042 Chronic combined systolic (congestive) and diastolic (congestive) heart failure: Secondary | ICD-10-CM

## 2021-12-06 DIAGNOSIS — I452 Bifascicular block: Secondary | ICD-10-CM

## 2021-12-06 DIAGNOSIS — I483 Typical atrial flutter: Secondary | ICD-10-CM

## 2021-12-06 NOTE — Patient Instructions (Addendum)
Medication Instructions:  Stop Amiodarone Your physician recommends that you continue on your current medications as directed. Please refer to the Current Medication list given to you today. *If you need a refill on your cardiac medications before your next appointment, please call your pharmacy*  Lab Work: None. If you have labs (blood work) drawn today and your tests are completely normal, you will receive your results only by: [place] (if you have MyChart) OR A paper copy in the mail If you have any lab test that is abnormal or we need to change your treatment, we will call you to review the results.  Testing/Procedures: Your physician has requested that you have an echocardiogram. Echocardiography is a painless test that uses sound waves to create images of your heart. It provides your doctor with information about the size and shape of your heart and how well your hearts chambers and valves are working. This procedure takes approximately one hour. There are no restrictions for this procedure.    Follow-Up: At [facility], you and your health needs are our priority.  As part of our continuing mission to provide you with exceptional heart care, we have created designated Provider Care Teams.  These Care Teams include your primary Cardiologist (physician) and Advanced Practice Providers (APPs -  Physician Assistants and Nurse Practitioners) who all work together to provide you with the care you need, when you need it.  Your physician wants you to follow-up in: 6-8 weeks with Lars Mage, MD   We recommend signing up for the patient portal called "MyChart".  Sign up information is provided on this After Visit Summary.  MyChart is used to connect with patients for Virtual Visits (Telemedicine).  Patients are able to view lab/test results, encounter notes, upcoming appointments, etc.  Non-urgent messages can be sent to your provider as well.   To learn more about what you can do  with MyChart, go to [email or web address].    Any Other Special Instructions Will Be Listed Below (If Applicable).

## 2021-12-06 NOTE — Progress Notes (Signed)
Electrophysiology Office Follow up Visit Note:    Date:  12/06/2021   ID:  Matthew Schmitt, DOB 04-Apr-1951, MRN [id number]  PCP:  Leonides Sake, MD  [place] Cardiologist:  Candee Furbish, MD  [facility] Electrophysiologist:  Vickie Epley, MD    Interval History:    Matthew Schmitt is a 71 y.o. male who presents for a follow up visit.  He underwent a successful atrial flutter ablation on October 27, 2021.  He also has a history of chronic systolic heart failure that was thought to be at least in part due to tachycardia mediated cardiomyopathy.  Since his ablation he denies any recurrent palpitations. His heart rate has been stable, consistently in the range of 60-80 bpm. He states that there is "maybe a little" improvement in his energy levels. Also, he reports intermittent erythema in his left eye with onset in the past 2 months. He notes this initially developed after his ablation. He presented to Urgent Care and his symptoms were thought to be due to a spontaneous ruptured vessel. He denies any chest pain, or shortness of breath. No lightheadedness, headaches, syncope, orthopnea, PND, lower extremity edema or exertional symptoms.       Past Medical History:  Diagnosis Date   Anxiety    Arthritis    everywhere   Arthrosis of right acromioclavicular joint 01/29/2020   Atrial flutter [facility])    Onset May 2014 Cardioversion done    BPH associated with nocturia    BPH with obstruction/lower urinary tract symptoms 01/04/2017   CAD (coronary artery disease) 04/02/2013   Cath 2012 moderate mid LAD lesion, otherwise not much disease    Coronary artery disease    cardiologist-  dr Bettina Gavia (previouly dr Wynonia Lawman)   Diabetes [facility]) 01/18/2018   Encounter for long-term (current) use of other medications 07/27/2013   Essential hypertension, benign    First degree heart block    Heart murmur    History of bilateral knee replacement 02/07/2017   History of bladder stone    History of  kidney stones    History of melanoma excision    June 2016  --- s/p  excision right  leg (per pt localized and no recurrence)   History of renal cell carcinoma 01/2017   s/p  left partial nephrecotmy   Hypertensive heart disease 04/02/2013   Impingement syndrome of right shoulder 01/29/2020   Impotence of organic origin    Left-sided low back pain with left-sided sciatica 01/23/2019   Long term current use of anticoagulant therapy    Mixed hyperlipidemia    slightly   Multiple renal cysts    bilateral   Nephrolithiasis 05/17/2014   Nonischemic cardiomyopathy (HCC)    Osteoarthritis    PAF (paroxysmal atrial fibrillation) ([place])    followed by cardiology  (takes ASA)   RBBB    RBBB (right bundle branch block with left anterior fascicular block) 04/02/2013   Right ureteral stone    Rotator cuff syndrome of left shoulder 04/17/2016   Status post total left knee replacement 01/01/2020   Tendinopathy of right biceps tendon 01/29/2020   Tendinopathy of right rotator cuff 01/29/2020   Type 2 diabetes mellitus treated with insulin [facility])    endocrinologist-  dr Loanne Drilling   Wears glasses     Past Surgical History:  Procedure Laterality Date   A-FLUTTER ABLATION N/A 10/27/2021   Procedure: A-FLUTTER ABLATION;  Surgeon: Vickie Epley, MD;  Location: [place] [facility];  Service:  Cardiovascular;  Laterality: N/A;   CARDIAC CATHETERIZATION  08-17-2011  DR Frederico Hamman [place]   POSSIBLE MODERATE LAD STENOSIS JUST AFTER THE BIFURCATION OF LARGE DIAGONAL BRANCH/ LVEF  40-45%/ MILD GLOBAL HYPODINESIS (CATH DONE FOR ABNORMAL STRESS TEST OF FIXED LATERAL WALL DEFECT & BIFASCICULAR BLAOCK)   CARDIOVERSION N/A 04/02/2013   Procedure: CARDIOVERSION;  Surgeon: Jacolyn Reedy, MD;  Location: [place];  Service: Cardiovascular;  Laterality: N/A;   CARDIOVERSION N/A 12/27/2020   Procedure: CARDIOVERSION;  Surgeon: Skeet Latch, MD;  Location: [place];  Service: Cardiovascular;  Laterality: N/A;   CYSTOSCOPY  WITH LITHOLAPAXY N/A 05/18/2015   Procedure: CYSTOSCOPY WITH LITHOLAPAXY;  Surgeon: Rana Snare, MD;  Location: [facility];  Service: Urology;  Laterality: N/A;   CYSTOSCOPY WITH LITHOLAPAXY N/A 01/04/2017   Procedure: CYSTOSCOPY WITH LITHOLAPAXY, Holmium Laser;  Surgeon: Ardis Hughs, MD;  Location: [facility];  Service: Urology;  Laterality: N/A;   CYSTOSCOPY WITH RETROGRADE PYELOGRAM, URETEROSCOPY AND STENT PLACEMENT  11/03/2012   Procedure: CYSTOSCOPY WITH RETROGRADE PYELOGRAM, URETEROSCOPY AND STENT PLACEMENT;  Surgeon: Bernestine Amass, MD;  Location: [facility];  Service: Urology;  Laterality: Left;  Cystoscopy/Left Retrograde/Ureteroscopy/Holmium Laser Litho/Double J Stent   CYSTOSCOPY WITH URETEROSCOPY, STONE BASKETRY AND STENT PLACEMENT Right 05/15/2019   Procedure: CYSTOSCOPY WITH URETEROSCOPY, LASER LITHOTRIPSY STONE BASKETRY AND STENT PLACEMENT, RIGHT RETROGRADE;  Surgeon: Ardis Hughs, MD;  Location[facility];  Service: Urology;  Laterality: Right;   HOLMIUM LASER APPLICATION  70/35/0093   Procedure: HOLMIUM LASER APPLICATION;  Surgeon: Bernestine Amass, MD;  Location: [facility];  Service: Urology;  Laterality: Left;   HOLMIUM LASER APPLICATION N/A 07/06/2992   Procedure: HOLMIUM LASER APPLICATION;  Surgeon: Rana Snare, MD;  Location: [facility];  Service: Urology;  Laterality: N/A;   KNEE SURGERY Bilateral [place] Bilateral June 2016   20th-- left leg //  1st-- right leg w/ skin graft from right thigh (no lymph node bx)   NEPHROLITHOTOMY Right 05/17/2014   Procedure: NEPHROLITHOTOMY PERCUTANEOUS;  Surgeon: Bernestine Amass, MD;  Location: [facility];  Service: Urology;  Laterality: Right;   PILONIDAL CYST EXCISION     ROBOTIC ASSITED PARTIAL NEPHRECTOMY Left 06/18/2013   Procedure: ROBOTIC ASSISTED PARTIAL NEPHRECTOMY;  Surgeon: Dutch Gray, MD;  Location: [facility];  Service:  Urology;  Laterality: Left;   ROTATOR CUFF REPAIR Left 07/2016   SHOULDER ARTHROSCOPY WITH ROTATOR CUFF REPAIR AND SUBACROMIAL DECOMPRESSION Right 05/27/2020   Procedure: RIGHT SHOULDER ARTHROSCOPY WITH EXTENSIVE DEBRIDEMENT, DISTAL CLAVICLE EXCISION AND SUBACROMIAL DECOMPRESSION;  Surgeon: Leandrew Koyanagi, MD;  Location: [place];  Service: Orthopedics;  Laterality: Right;   TEE WITH CARDIOVERSION  12/27/2020   TEE WITHOUT CARDIOVERSION N/A 12/27/2020   Procedure: TRANSESOPHAGEAL ECHOCARDIOGRAM (TEE);  Surgeon: Skeet Latch, MD;  Location: [facility];  Service: Cardiovascular;  Laterality: N/A;   TONSILLECTOMY  70  (age 72)   TOTAL KNEE ARTHROPLASTY Left 02/07/2017   Procedure: LEFT TOTAL KNEE ARTHROPLASTY;  Surgeon: Leandrew Koyanagi, MD;  Location: [place];  Service: Orthopedics;  Laterality: Left;   TOTAL KNEE ARTHROPLASTY Right 04/25/2017   Procedure: RIGHT TOTAL KNEE ARTHROPLASTY;  Surgeon: Leandrew Koyanagi, MD;  Location: [place];  Service: Orthopedics;  Laterality: Right;   TRANSURETHRAL RESECTION OF PROSTATE N/A 01/04/2017   Procedure: TRANSURETHRAL RESECTION OF THE PROSTATE (TURP);  Surgeon: Ardis Hughs, MD;  Location: [facility];  Service: Urology;  Laterality: N/A;    Current Medications: Current Meds  Medication Sig   dapagliflozin propanediol (FARXIGA) 10 MG TABS tablet Take 1 tablet (10 mg total) by mouth daily before breakfast.   Insulin Glargine (BASAGLAR KWIKPEN) 100 UNIT/ML Inject 60 Units into the skin every morning. And pen needles 1/day   Insulin Pen Needle (PEN NEEDLES) 30G X 8 MM MISC 1 each by Does not apply route daily. E11.9   Lancets (ONETOUCH DELICA PLUS WVPXTG62I) MISC 1 each by Other route 2 (two) times daily. E11.9   metoprolol succinate (TOPROL-XL) 50 MG 24 hr tablet TAKE 1 TABLET (50 MG TOTAL) BY MOUTH DAILY. TAKE WITH OR IMMEDIATELY FOLLOWING A MEAL.   rivaroxaban (XARELTO) 20 MG TABS tablet Take 1 tablet (20 mg total) by mouth daily with supper.    rosuvastatin (CRESTOR) 20 MG tablet Take 1 tablet (20 mg total) by mouth daily.   sacubitril-valsartan (ENTRESTO) 49-51 MG TAKE 1 TABLET BY MOUTH TWO TIMES DAILY.   spironolactone (ALDACTONE) 25 MG tablet TAKE 1/2 TABLET (12.5 MG TOTAL) BY MOUTH DAILY. (Patient taking differently: Take 25 mg by mouth every other day.)   trolamine salicylate (ASPERCREME) 10 % cream Apply 1 application topically as needed for muscle pain.   [DISCONTINUED] amiodarone (PACERONE) 200 MG tablet Take 200mg  (1 tablet) twice daily for 2 weeks. Then on 01/12/2021, reduce dose to 200mg  once daily. (Patient taking differently: Take 200 mg by mouth daily.)     Allergies:   Actos [pioglitazone]   Social History   Socioeconomic History   Marital status: Married    Spouse name: Not on file   Number of children: 2   Years of education: 16   Highest education level: Not on file  Occupational History   Occupation: Government social research officer, Psychologist, occupational  Tobacco Use   Smoking status: Former    Packs/day: 1.00    Years: 6.00    Pack years: 6.00    Types: Cigarettes    Quit date: 10/31/1969    Years since quitting: 52.1   Smokeless tobacco: Never  Vaping Use   Vaping Use: Never used  Substance and Sexual Activity   Alcohol use: Yes    Alcohol/week: 0.0 standard drinks    Comment: OCCASIONAL   Drug use: No   Sexual activity: Never  Other Topics Concern   Not on file  Social History Narrative   Regular exercise-no   Social Determinants of Health   Financial Resource Strain: Not on file  Food Insecurity: Not on file  Transportation Needs: Not on file  Physical Activity: Not on file  Stress: Not on file  Social Connections: Not on file     Family History: The patient's family history includes Arthritis in his father and mother; Cancer in his maternal uncle and paternal uncle; Diabetes in his brother and father; Heart attack (age of onset: 13) in his brother; Hypertension in his father and mother; Lymphoma in his  paternal grandfather.  ROS:   Please see the history of present illness.    All other systems reviewed and are negative.  EKGs/Labs/Other Studies Reviewed:    The following studies were reviewed today:  October 27, 2021  A-Flutter Ablation: CONCLUSIONS:   1. Isthmus-dependent counter clockwise right atrial flutter.   2. Successful radiofrequency ablation of atrial flutter along the cavotricuspid isthmus with complete bidirectional isthmus block achieved.   3. No inducible arrhythmias following ablation.   4.  Electrophysiology study with infusion of Isopril revealed a prolonged HV interval of  75 ms, wide QRS 175 ms.  5. No early apparent complications.   October 20, 2021  Cardiac CTA: IMPRESSION: 1. There is normal pulmonary vein drainage into the left atrium.   2. The left atrial appendage is large chicken wing type with two lobes and ostial size 31 x 18 mm and length 43 mm. There is no thrombus in the left atrial appendage.   3. The esophagus runs in the left atrial midline adjacent to the left lower pulmonary vein.   4. Coronary calcium score 1092. This was 85th percentile for age and gender.   5. Ascending aorta mildly dilated.  4.1 cm.  October 04, 2021 Monitor: HR 49 - 131bpm, average 85 bpm. 100% atrial flutter. No atrial fibrillation detected. Rare ventricular ectopy.  May 10, 2021 echo Left ventricular function moderately decreased, 30% Right ventricular function mildly reduced   EKG:   12/06/2021: Sinus rhythm. RBBB.  Recent Labs: 12/19/2020: NT-Pro BNP 3,563 12/26/2020: B Natriuretic Peptide 412.0 08/18/2021: ALT 20; TSH 0.469 10/05/2021: BUN 26; Creatinine, Ser 1.15; Hemoglobin 17.6; Platelets 156; Potassium 4.5; Sodium 140  Recent Lipid Panel    Component Value Date/Time   CHOL 149 12/19/2020 1656   TRIG 165 (H) 12/19/2020 1656   HDL 42 12/19/2020 1656   CHOLHDL 3.5 12/19/2020 1656   CHOLHDL 4.2 07/20/2014 0822   VLDL 26 07/20/2014 0822    LDLCALC 79 12/19/2020 1656    Physical Exam:    VS:  BP 120/74    Pulse 62    Ht 5\' 11"  (1.803 m)    Wt 230 lb 9.6 oz (104.6 kg)    SpO2 96%    BMI 32.16 kg/m     Wt Readings from Last 3 Encounters:  12/06/21 230 lb 9.6 oz (104.6 kg)  10/27/21 225 lb (102.1 kg)  09/22/21 227 lb (103 kg)     GEN: Well nourished, well developed in no acute distress HEENT: Injected sclera, left eye, medial portion. NECK: No JVD; No carotid bruits LYMPHATICS: No lymphadenopathy CARDIAC: RRR, no murmurs, rubs, gallops RESPIRATORY:  Clear to auscultation without rales, wheezing or rhonchi  ABDOMEN: Soft, non-tender, non-distended MUSCULOSKELETAL:  No edema; No deformity  SKIN: Warm and dry NEUROLOGIC:  Alert and oriented x 3 PSYCHIATRIC:  Normal affect       ASSESSMENT:    1. Typical atrial flutter ([place])   2. RBBB (right bundle branch block with left anterior fascicular block)   3. Chronic combined systolic and diastolic CHF (congestive heart failure) (HCC)    PLAN:    In order of problems listed above:  #Typical atrial flutter No recurrence after his ablation.  Has felt better in normal rhythm.  I would like to repeat his echocardiogram now that we are several weeks after the successful ablation to see if his ejection fraction has improved.  I would like to see him back in clinic after the echo to review the results and to determine next steps.  If his ejection fraction has improved, we will continue with routine follow-up.  If his ejection fraction is persistently reduced, would plan for ICD implant.  The patient has evidence of significant baseline conduction system disease and a wide right bundle branch block with a QRS duration 160 ms.  If an ICD is required, would plan for a CRT-D system.  I discussed this with the patient during today's appointment briefly.  Would plan to discuss further pending the results of the upcoming echocardiogram. Given that he has not had  a recurrence after his  ablation, can stop the amiodarone today.  #Injected left eye sclera I have asked him to follow-up soon with his ophthalmologist.  I do not think we are far enough out from his ablation to safely stop the anticoagulant.  I would like him to be 3 months following his flutter ablation before stopping anticoagulant.  He says the symptoms improve with antibiotic drops.?  Infectious process.  #Chronic combined systolic and diastolic heart failure NYHA class II today.  Warm dry on exam.  Rhythm control is important as above.  Stop amiodarone.  Repeat echo.  If EF is persistently reduced, would plan for CRT-D implant as above.  I will discuss this further with the patient at her upcoming appointment.   Follow-up in 6-8 weeks to discuss echocardiogram results.   Medication Adjustments/Labs and Tests Ordered: Current medicines are reviewed at length with the patient today.  Concerns regarding medicines are outlined above.  Orders Placed This Encounter  Procedures   EKG 12-Lead   ECHOCARDIOGRAM COMPLETE   No orders of the defined types were placed in this encounter.  I,Mathew Stumpf,acting as a Education administrator for Vickie Epley, MD.,have documented all relevant documentation on the behalf of Vickie Epley, MD,as directed by  Vickie Epley, MD while in the presence of Vickie Epley, MD.  I, Vickie Epley, MD, have reviewed all documentation for this visit. The documentation on 12/06/21 for the exam, diagnosis, procedures, and orders are all accurate and complete.   Signed, Matthew Mage, MD, [facility], [facility] 12/06/2021 5:36 PM    Electrophysiology [facility]

## 2021-12-08 ENCOUNTER — Ambulatory Visit: Payer: BC Managed Care – PPO | Admitting: Endocrinology

## 2021-12-15 ENCOUNTER — Other Ambulatory Visit (HOSPITAL_COMMUNITY): Payer: BC Managed Care – PPO

## 2021-12-22 ENCOUNTER — Other Ambulatory Visit: Payer: Self-pay

## 2021-12-22 ENCOUNTER — Ambulatory Visit (HOSPITAL_COMMUNITY): Payer: BC Managed Care – PPO | Attending: Cardiovascular Disease

## 2021-12-22 DIAGNOSIS — I452 Bifascicular block: Secondary | ICD-10-CM

## 2021-12-22 DIAGNOSIS — I483 Typical atrial flutter: Secondary | ICD-10-CM | POA: Diagnosis not present

## 2021-12-22 DIAGNOSIS — I5042 Chronic combined systolic (congestive) and diastolic (congestive) heart failure: Secondary | ICD-10-CM

## 2021-12-22 LAB — ECHOCARDIOGRAM COMPLETE
AR max vel: 1.9 cm2
AV Area VTI: 1.89 cm2
AV Area mean vel: 1.86 cm2
AV Mean grad: 19 mmHg
AV Peak grad: 37 mmHg
Ao pk vel: 3.04 m/s
Area-P 1/2: 2.76 cm2
P 1/2 time: 600 msec
S' Lateral: 4.4 cm

## 2022-01-05 ENCOUNTER — Ambulatory Visit: Payer: Self-pay

## 2022-01-05 ENCOUNTER — Ambulatory Visit (INDEPENDENT_AMBULATORY_CARE_PROVIDER_SITE_OTHER): Payer: BC Managed Care – PPO | Admitting: Orthopaedic Surgery

## 2022-01-05 ENCOUNTER — Other Ambulatory Visit: Payer: Self-pay

## 2022-01-05 ENCOUNTER — Ambulatory Visit: Payer: BC Managed Care – PPO | Admitting: Endocrinology

## 2022-01-05 ENCOUNTER — Encounter: Payer: Self-pay | Admitting: Orthopaedic Surgery

## 2022-01-05 VITALS — BP 170/70 | HR 59 | Ht 71.0 in | Wt 230.6 lb

## 2022-01-05 DIAGNOSIS — Z96652 Presence of left artificial knee joint: Secondary | ICD-10-CM

## 2022-01-05 DIAGNOSIS — E1159 Type 2 diabetes mellitus with other circulatory complications: Secondary | ICD-10-CM | POA: Diagnosis not present

## 2022-01-05 DIAGNOSIS — M79641 Pain in right hand: Secondary | ICD-10-CM | POA: Diagnosis not present

## 2022-01-05 DIAGNOSIS — Z794 Long term (current) use of insulin: Secondary | ICD-10-CM

## 2022-01-05 LAB — POCT GLYCOSYLATED HEMOGLOBIN (HGB A1C): Hemoglobin A1C: 8 % — AB (ref 4.0–5.6)

## 2022-01-05 MED ORDER — BASAGLAR KWIKPEN 100 UNIT/ML ~~LOC~~ SOPN: 80.0000 [IU] | PEN_INJECTOR | SUBCUTANEOUS | 3 refills | Status: DC

## 2022-01-05 MED ORDER — BUPIVACAINE HCL 0.5 % IJ SOLN
2.0000 mL | INTRAMUSCULAR | Status: AC | PRN
  Administered 2022-01-05: 2 mL via INTRA_ARTICULAR

## 2022-01-05 MED ORDER — LIDOCAINE HCL 1 % IJ SOLN
2.0000 mL | INTRAMUSCULAR | Status: AC | PRN
  Administered 2022-01-05: 2 mL

## 2022-01-05 NOTE — Addendum Note (Signed)
Addended by: Jacklyn Shell on: 01/05/2022 10:35 AM   Modules accepted: Orders

## 2022-01-05 NOTE — Progress Notes (Signed)
Office Visit Note   Patient: Matthew Schmitt           Date of Birth: 12/29/1950           MRN: [id number] Visit Date: 01/05/2022              Requested by: Matthew Sake, MD [place],  [place] [address] PCP: Matthew Sake, MD   Assessment & Plan: Visit Diagnoses:  1. Status post total left knee replacement   2. Pain of right hand     Plan: Matthew Schmitt is status post left total knee replacement on March 2018.  He comes in for annual checkup.  In terms of the knee he reports swelling and start up pain which is better after walking about 100 feet.  He states that the pain is about 3-4 out of 10.  The pain is manageable overall.  He recently had an ablation of his heart for atrial flutter.  He is also experiencing some tightness and pain and swelling in his right index finger over the MCP joint at this causing difficulty with grasping and gripping.  Examination of the left knee shows fully healed surgical scar with a small effusion.  1+ valgus thrust.  Range of motion is at baseline.  No tenderness to palpation. Examination of the right hand shows mild swelling over the dorsal aspect of the index MCP joint.  There is no triggering.  He is able to make a full composite fist with mild discomfort.  Extensor tendon centrally located over the metacarpal head.  In regards to the right hand I recommend topical Voltaren gel and over-the-counter medications as needed.  He cannot take NSAIDs due to cardiac history.  In regards to the left knee we did draw inflammatory markers a couple years ago and we initially started following him for loosening and they were all normal.  We will go ahead and aspirate the knee today just to be sure that there is not an infection.  I will contact the patient with the fluid results.  Follow-Up Instructions: No follow-ups on file.   Orders:  Orders Placed This Encounter  Procedures   XR Knee 1-2 Views Left   No orders of the defined types were placed  in this encounter.     Procedures: Large Joint Inj: L knee on 01/05/2022 9:18 AM Details: 22 G needle Medications: 2 mL bupivacaine 0.5 %; 2 mL lidocaine 1 % Outcome: tolerated well, no immediate complications Patient was prepped and draped in the usual sterile fashion.      Clinical Data: No additional findings.   Subjective: Chief Complaint  Patient presents with   Left Knee - Routine Post Op    HPI  Review of Systems  Constitutional: Negative.   All other systems reviewed and are negative.   Objective: Vital Signs: There were no vitals taken for this visit.  Physical Exam Vitals and nursing note reviewed.  Constitutional:      Appearance: He is well-developed.  Pulmonary:     Effort: Pulmonary effort is normal.  Abdominal:     Palpations: Abdomen is soft.  Skin:    General: Skin is warm.  Neurological:     Mental Status: He is alert and oriented to person, place, and time.  Psychiatric:        Behavior: Behavior normal.        Thought Content: Thought content normal.        Judgment: Judgment normal.  Ortho Exam  Specialty Comments:  No specialty comments available.  Imaging: No results found.   PMFS History: Patient Active Problem List   Diagnosis Date Noted   Chronic systolic heart failure ([place]) 08/18/2021   Aortic stenosis 08/18/2021   Chronic kidney disease, stage 3a ([place]) 08/18/2021   Dilated aortic root ([place]) 08/18/2021   Bicuspid aortic valve 08/18/2021   AKI (acute kidney injury) ([place]) 12/29/2020   Hypokalemia 12/29/2020   Obstructive sleep apnea 12/29/2020   Thrombocytopenia ([place]) 12/29/2020   Hematuria 12/29/2020   Wears glasses    Type 2 diabetes mellitus treated with insulin ([place])    Right ureteral stone    RBBB    PAF (paroxysmal atrial fibrillation) ([place])    Multiple renal cysts    Impotence of organic origin    History of melanoma excision    History of kidney stones    History of bladder stone    Heart murmur     First degree heart block    Hypertension    BPH associated with nocturia    Arthritis    Anxiety    Tendinopathy of right rotator cuff 01/29/2020   Tendinopathy of right biceps tendon 01/29/2020   Impingement syndrome of right shoulder 01/29/2020   Arthrosis of right acromioclavicular joint 01/29/2020   Chronic right shoulder pain 01/01/2020   Status post total left knee replacement 01/01/2020   Left-sided low back pain with left-sided sciatica 01/23/2019   Diabetes ([place]) 01/18/2018   History of bilateral knee replacement 02/07/2017   Primary osteoarthritis of left knee    History of renal cell carcinoma 01/2017   BPH with obstruction/lower urinary tract symptoms 01/04/2017   Rotator cuff syndrome of left shoulder 04/17/2016   Nephrolithiasis 05/17/2014   Encounter for long-term (current) use of other medications 07/27/2013   Long term current use of anticoagulant therapy    CAD (coronary artery disease) 04/02/2013   Hypertensive heart disease 04/02/2013   RBBB (right bundle branch block with left anterior fascicular block) 04/02/2013   Atrial flutter (HCC)    Nonischemic cardiomyopathy (HCC)    Obesity (BMI 30-39.9)    History of kidney stones    Osteoarthritis    Hyperlipidemia    Past Medical History:  Diagnosis Date   Anxiety    Arthritis    everywhere   Arthrosis of right acromioclavicular joint 01/29/2020   Atrial flutter [facility])    Onset May 2014 Cardioversion done    BPH associated with nocturia    BPH with obstruction/lower urinary tract symptoms 01/04/2017   CAD (coronary artery disease) 04/02/2013   Cath 2012 moderate mid LAD lesion, otherwise not much disease    Coronary artery disease    cardiologist-  dr Bettina Gavia (previouly dr Wynonia Lawman)   Diabetes [facility]) 01/18/2018   Encounter for long-term (current) use of other medications 07/27/2013   Essential hypertension, benign    First degree heart block    Heart murmur    History of bilateral knee replacement 02/07/2017    History of bladder stone    History of kidney stones    History of melanoma excision    June 2016  --- s/p  excision right  leg (per pt localized and no recurrence)   History of renal cell carcinoma 01/2017   s/p  left partial nephrecotmy   Hypertensive heart disease 04/02/2013   Impingement syndrome of right shoulder 01/29/2020   Impotence of organic origin    Left-sided low back pain with left-sided sciatica 01/23/2019  Long term current use of anticoagulant therapy    Mixed hyperlipidemia    slightly   Multiple renal cysts    bilateral   Nephrolithiasis 05/17/2014   Nonischemic cardiomyopathy (HCC)    Osteoarthritis    PAF (paroxysmal atrial fibrillation) (HCC)    followed by cardiology  (takes ASA)   RBBB    RBBB (right bundle branch block with left anterior fascicular block) 04/02/2013   Right ureteral stone    Rotator cuff syndrome of left shoulder 04/17/2016   Status post total left knee replacement 01/01/2020   Tendinopathy of right biceps tendon 01/29/2020   Tendinopathy of right rotator cuff 01/29/2020   Type 2 diabetes mellitus treated with insulin [facility])    endocrinologist-  dr Loanne Drilling   Wears glasses     Family History  Problem Relation Age of Onset   Arthritis Father    Hypertension Father    Diabetes Father    Arthritis Mother    Hypertension Mother    Heart attack Brother 40   Diabetes Brother    Lymphoma Paternal Grandfather    Cancer Maternal Uncle    Cancer Paternal Uncle        type unknown    Past Surgical History:  Procedure Laterality Date   A-FLUTTER ABLATION N/A 10/27/2021   Procedure: A-FLUTTER ABLATION;  Surgeon: Vickie Epley, MD;  Location: [place] [facility];  Service: Cardiovascular;  Laterality: N/A;   CARDIAC CATHETERIZATION  08-17-2011  DR Frederico Hamman T[place]   POSSIBLE MODERATE LAD STENOSIS JUST AFTER THE BIFURCATION OF LARGE DIAGONAL BRANCH/ LVEF  40-45%/ MILD GLOBAL HYPODINESIS (CATH DONE FOR ABNORMAL STRESS TEST OF FIXED LATERAL WALL DEFECT  & BIFASCICULAR [place])   CARDIOVERSION N/A 04/02/2013   Procedure: CARDIOVERSION;  Surgeon: Jacolyn Reedy, MD;  Location: [place];  Service: Cardiovascular;  Laterality: N/A;   CARDIOVERSION N/A 12/27/2020   Procedure: CARDIOVERSION;  Surgeon: Skeet Latch, MD;  Location: [place];  Service: Cardiovascular;  Laterality: N/A;   CYSTOSCOPY WITH LITHOLAPAXY N/A 05/18/2015   Procedure: CYSTOSCOPY WITH LITHOLAPAXY;  Surgeon: Rana Snare, MD;  Location[facility];  Service: Urology;  Laterality: N/A;   CYSTOSCOPY WITH LITHOLAPAXY N/A 01/04/2017   Procedure: CYSTOSCOPY WITH LITHOLAPAXY, Holmium Laser;  Surgeon: Ardis Hughs, MD;  [facility];  Service: Urology;  Laterality: N/A;   CYSTOSCOPY WITH RETROGRADE PYELOGRAM, URETEROSCOPY AND STENT PLACEMENT  11/03/2012   Procedure: CYSTOSCOPY WITH RETROGRADE PYELOGRAM, URETEROSCOPY AND STENT PLACEMENT;  Surgeon: Bernestine Amass, MD;  Location: [facility];  Service: Urology;  Laterality: Left;  Cystoscopy/Left Retrograde/Ureteroscopy/Holmium Laser Litho/Double J Stent   CYSTOSCOPY WITH URETEROSCOPY, STONE BASKETRY AND STENT PLACEMENT Right 05/15/2019   Procedure: CYSTOSCOPY WITH URETEROSCOPY, LASER LITHOTRIPSY STONE BASKETRY AND STENT PLACEMENT, RIGHT RETROGRADE;  Surgeon: Ardis Hughs, MD;  Location: [facility];  Service: Urology;  Laterality: Right;   HOLMIUM LASER APPLICATION  34/19/3790   Procedure: HOLMIUM LASER APPLICATION;  Surgeon: Bernestine Amass, MD;  Location: [facility];  Service: Urology;  Laterality: Left;   HOLMIUM LASER APPLICATION N/A 2/40/9735   Procedure: HOLMIUM LASER APPLICATION;  Surgeon: Rana Snare, MD;  Location: [facility];  Service: Urology;  Laterality: N/A;   KNEE SURGERY Bilateral [place] Bilateral June 2016   20th-- left leg //  1st-- right leg w/ skin graft from right thigh (no lymph node bx)    NEPHROLITHOTOMY Right 05/17/2014  Procedure: NEPHROLITHOTOMY PERCUTANEOUS;  Surgeon: Bernestine Amass, MD;  [facility];  Service: Urology;  Laterality: Right;   PILONIDAL CYST EXCISION     ROBOTIC ASSITED PARTIAL NEPHRECTOMY Left 06/18/2013   Procedure: ROBOTIC ASSISTED PARTIAL NEPHRECTOMY;  Surgeon: Dutch Gray, MD;  [facility];  Service: Urology;  Laterality: Left;   ROTATOR CUFF REPAIR Left 07/2016   SHOULDER ARTHROSCOPY WITH ROTATOR CUFF REPAIR AND SUBACROMIAL DECOMPRESSION Right 05/27/2020   Procedure: RIGHT SHOULDER ARTHROSCOPY WITH EXTENSIVE DEBRIDEMENT, DISTAL CLAVICLE EXCISION AND SUBACROMIAL DECOMPRESSION;  Surgeon: Leandrew Koyanagi, MD;  Location: [place];  Service: Orthopedics;  Laterality: Right;   TEE WITH CARDIOVERSION  12/27/2020   TEE WITHOUT CARDIOVERSION N/A 12/27/2020   Procedure: TRANSESOPHAGEAL ECHOCARDIOGRAM (TEE);  Surgeon: Skeet Latch, MD;  Location: [facility];  Service: Cardiovascular;  Laterality: N/A;   TONSILLECTOMY  27  (age 86)   TOTAL KNEE ARTHROPLASTY Left 02/07/2017   Procedure: LEFT TOTAL KNEE ARTHROPLASTY;  Surgeon: Leandrew Koyanagi, MD;  Location: [place];  Service: Orthopedics;  Laterality: Left;   TOTAL KNEE ARTHROPLASTY Right 04/25/2017   Procedure: RIGHT TOTAL KNEE ARTHROPLASTY;  Surgeon: Leandrew Koyanagi, MD;  Location: [place];  Service: Orthopedics;  Laterality: Right;   TRANSURETHRAL RESECTION OF PROSTATE N/A 01/04/2017   Procedure: TRANSURETHRAL RESECTION OF THE PROSTATE (TURP);  Surgeon: Ardis Hughs, MD;  [facility];  Service: Urology;  Laterality: N/A;   Social History   Occupational History   Occupation: Government social research officer, Psychologist, occupational  Tobacco Use   Smoking status: Former    Packs/day: 1.00    Years: 6.00    Pack years: 6.00    Types: Cigarettes    Quit date: 10/31/1969    Years since quitting: 52.2   Smokeless tobacco: Never  Vaping Use   Vaping Use: Never used  Substance and Sexual Activity   Alcohol  use: Yes    Alcohol/week: 0.0 standard drinks    Comment: OCCASIONAL   Drug use: No   Sexual activity: Never

## 2022-01-05 NOTE — Patient Instructions (Addendum)
Please continue to follow up the blood pressure with cardiology.   I have sent a prescription to your pharmacy, to increase the Basaglar to 80 units each morning.   Please continue the same [place].  check your blood sugar twice a day.  vary the time of day when you check, between before the 3 meals, and at bedtime.  also check if you have symptoms of your blood sugar being too high or too low.  please keep a record of the readings and bring it to your next appointment here (or you can bring the meter itself).  You can write it on any piece of paper.  please call us sooner if your blood sugar goes below 70, or if you have a lot of readings over 200.   Please come back for a follow-up appointment in May.

## 2022-01-05 NOTE — Progress Notes (Signed)
Subjective:    Patient ID: Matthew Schmitt, male    DOB: Mar 09, 1951, 71 y.o.   MRN: [id number]  HPI Pt returns for f/u of diabetes mellitus:  DM type: Insulin-requiring type 2.  Dx'ed: 2002.   Complications: renal insuff, leg ulcers, and CAD.   Therapy: insulin since 1517, and Trulicity.   DKA: never.   Severe hypoglycemia: once, in 2017.  Pancreatitis: never.   Other: he declines multiple daily injections; In early 2017, he requested to change levemir to lantus, due to high dosage requirement at the time; fructosamine has showed better glycemic control than O1Y; he stopped Trulicity, due to lack of effect.   Interval history: He takes 60 units qam. Pt says cbg's vary from 110-230.  He checks fasting only; pt states he feels well in general.  He has increased insulin to 75 units qam.   Past Medical History:  Diagnosis Date   Anxiety    Arthritis    everywhere   Arthrosis of right acromioclavicular joint 01/29/2020   Atrial flutter [facility])    Onset May 2014 Cardioversion done    BPH associated with nocturia    BPH with obstruction/lower urinary tract symptoms 01/04/2017   CAD (coronary artery disease) 04/02/2013   Cath 2012 moderate mid LAD lesion, otherwise not much disease    Coronary artery disease    cardiologist-  dr Bettina Gavia (previouly dr Wynonia Lawman)   Diabetes [facility]) 01/18/2018   Encounter for long-term (current) use of other medications 07/27/2013   Essential hypertension, benign    First degree heart block    Heart murmur    History of bilateral knee replacement 02/07/2017   History of bladder stone    History of kidney stones    History of melanoma excision    June 2016  --- s/p  excision right  leg (per pt localized and no recurrence)   History of renal cell carcinoma 01/2017   s/p  left partial nephrecotmy   Hypertensive heart disease 04/02/2013   Impingement syndrome of right shoulder 01/29/2020   Impotence of organic origin    Left-sided low back pain with left-sided sciatica  01/23/2019   Long term current use of anticoagulant therapy    Mixed hyperlipidemia    slightly   Multiple renal cysts    bilateral   Nephrolithiasis 05/17/2014   Nonischemic cardiomyopathy (HCC)    Osteoarthritis    PAF (paroxysmal atrial fibrillation) ([place])    followed by cardiology  (takes ASA)   RBBB    RBBB (right bundle branch block with left anterior fascicular block) 04/02/2013   Right ureteral stone    Rotator cuff syndrome of left shoulder 04/17/2016   Status post total left knee replacement 01/01/2020   Tendinopathy of right biceps tendon 01/29/2020   Tendinopathy of right rotator cuff 01/29/2020   Type 2 diabetes mellitus treated with insulin [facility])    endocrinologist-  dr Loanne Drilling   Wears glasses     Past Surgical History:  Procedure Laterality Date   A-FLUTTER ABLATION N/A 10/27/2021   Procedure: A-FLUTTER ABLATION;  Surgeon: Vickie Epley, MD;  Location: [place] [facility];  Service: Cardiovascular;  Laterality: N/A;   CARDIAC CATHETERIZATION  08-17-2011  DR SPENCER [place]   POSSIBLE MODERATE LAD STENOSIS JUST AFTER THE BIFURCATION OF LARGE DIAGONAL BRANCH/ LVEF  40-45%/ MILD GLOBAL HYPODINESIS (CATH DONE FOR ABNORMAL STRESS TEST OF FIXED LATERAL WALL DEFECT & BIFASCICULAR [place])   CARDIOVERSION N/A 04/02/2013   Procedure: CARDIOVERSION;  Surgeon: Gwyndolyn Saxon  Thurman Coyer, MD;  Location: [place];  Service: Cardiovascular;  Laterality: N/A;   CARDIOVERSION N/A 12/27/2020   Procedure: CARDIOVERSION;  Surgeon: Skeet Latch, MD;  Location: [place];  Service: Cardiovascular;  Laterality: N/A;   CYSTOSCOPY WITH LITHOLAPAXY N/A 05/18/2015   Procedure: CYSTOSCOPY WITH LITHOLAPAXY;  Surgeon: Rana Snare, MD;  Location: [facility];  Service: Urology;  Laterality: N/A;   CYSTOSCOPY WITH LITHOLAPAXY N/A 01/04/2017   Procedure: CYSTOSCOPY WITH LITHOLAPAXY, Holmium Laser;  Surgeon: Ardis Hughs, MD;  [facility];  Service: Urology;  Laterality: N/A;    CYSTOSCOPY WITH RETROGRADE PYELOGRAM, URETEROSCOPY AND STENT PLACEMENT  11/03/2012   Procedure: CYSTOSCOPY WITH RETROGRADE PYELOGRAM, URETEROSCOPY AND STENT PLACEMENT;  Surgeon: Bernestine Amass, MD;  Location: [facility];  Service: Urology;  Laterality: Left;  Cystoscopy/Left Retrograde/Ureteroscopy/Holmium Laser Litho/Double J Stent   CYSTOSCOPY WITH URETEROSCOPY, STONE BASKETRY AND STENT PLACEMENT Right 05/15/2019   Procedure: CYSTOSCOPY WITH URETEROSCOPY, LASER LITHOTRIPSY STONE BASKETRY AND STENT PLACEMENT, RIGHT RETROGRADE;  Surgeon: Ardis Hughs, MD;  Location: [facility];  Service: Urology;  Laterality: Right;   HOLMIUM LASER APPLICATION  16/08/9603   Procedure: HOLMIUM LASER APPLICATION;  Surgeon: Bernestine Amass, MD;  Location: [facility];  Service: Urology;  Laterality: Left;   HOLMIUM LASER APPLICATION N/A 5/40/9811   Procedure: HOLMIUM LASER APPLICATION;  Surgeon: Rana Snare, MD;  Location: [facility];  Service: Urology;  Laterality: N/A;   KNEE SURGERY Bilateral [place] Bilateral June 2016   20th-- left leg //  1st-- right leg w/ skin graft from right thigh (no lymph node bx)   NEPHROLITHOTOMY Right 05/17/2014   Procedure: NEPHROLITHOTOMY PERCUTANEOUS;  Surgeon: Bernestine Amass, MD;  [facility];  Service: Urology;  Laterality: Right;   PILONIDAL CYST EXCISION     ROBOTIC ASSITED PARTIAL NEPHRECTOMY Left 06/18/2013   Procedure: ROBOTIC ASSISTED PARTIAL NEPHRECTOMY;  Surgeon: Dutch Gray, MD;  [facility];  Service: Urology;  Laterality: Left;   ROTATOR CUFF REPAIR Left 07/2016   SHOULDER ARTHROSCOPY WITH ROTATOR CUFF REPAIR AND SUBACROMIAL DECOMPRESSION Right 05/27/2020   Procedure: RIGHT SHOULDER ARTHROSCOPY WITH EXTENSIVE DEBRIDEMENT, DISTAL CLAVICLE EXCISION AND SUBACROMIAL DECOMPRESSION;  Surgeon: Leandrew Koyanagi, MD;  Location: [place];  Service: Orthopedics;   Laterality: Right;   TEE WITH CARDIOVERSION  12/27/2020   TEE WITHOUT CARDIOVERSION N/A 12/27/2020   Procedure: TRANSESOPHAGEAL ECHOCARDIOGRAM (TEE);  Surgeon: Skeet Latch, MD;  Location: [facility];  Service: Cardiovascular;  Laterality: N/A;   TONSILLECTOMY  62  (age 60)   TOTAL KNEE ARTHROPLASTY Left 02/07/2017   Procedure: LEFT TOTAL KNEE ARTHROPLASTY;  Surgeon: Leandrew Koyanagi, MD;  Location: [place];  Service: Orthopedics;  Laterality: Left;   TOTAL KNEE ARTHROPLASTY Right 04/25/2017   Procedure: RIGHT TOTAL KNEE ARTHROPLASTY;  Surgeon: Leandrew Koyanagi, MD;  Location: [place];  Service: Orthopedics;  Laterality: Right;   TRANSURETHRAL RESECTION OF PROSTATE N/A 01/04/2017   Procedure: TRANSURETHRAL RESECTION OF THE PROSTATE (TURP);  Surgeon: Ardis Hughs, MD;  [facility];  Service: Urology;  Laterality: N/A;    Social History   Socioeconomic History   Marital status: Married    Spouse name: Not on file   Number of children: 2   Years of education: 16   Highest education level: Not on file  Occupational History   Occupation: Government social research officer, Psychologist, occupational  Tobacco Use  Smoking status: Former    Packs/day: 1.00    Years: 6.00    Pack years: 6.00    Types: Cigarettes    Quit date: 10/31/1969    Years since quitting: 52.2   Smokeless tobacco: Never  Vaping Use   Vaping Use: Never used  Substance and Sexual Activity   Alcohol use: Yes    Alcohol/week: 0.0 standard drinks    Comment: OCCASIONAL   Drug use: No   Sexual activity: Never  Other Topics Concern   Not on file  Social History Narrative   Regular exercise-no   Social Determinants of Health   Financial Resource Strain: Not on file  Food Insecurity: Not on file  Transportation Needs: Not on file  Physical Activity: Not on file  Stress: Not on file  Social Connections: Not on file  Intimate Partner Violence: Not on file    Current Outpatient Medications on File Prior to Visit  Medication Sig  Dispense Refill   dapagliflozin propanediol (FARXIGA) 10 MG TABS tablet Take 1 tablet (10 mg total) by mouth daily before breakfast. 30 tablet 0   Insulin Pen Needle (PEN NEEDLES) 30G X 8 MM MISC 1 each by Does not apply route daily. E11.9 90 each 0   Lancets (ONETOUCH DELICA PLUS [id number]) MISC 1 each by Other route 2 (two) times daily. E11.9 200 each 3   nitroGLYCERIN (NITROSTAT) 0.4 MG SL tablet Place 0.4 mg under the tongue every 5 (five) minutes as needed for chest pain.     rivaroxaban (XARELTO) 20 MG TABS tablet Take 1 tablet (20 mg total) by mouth daily with supper. 30 tablet 6   trolamine salicylate (ASPERCREME) 10 % cream Apply 1 application topically as needed for muscle pain.     metoprolol succinate (TOPROL-XL) 50 MG 24 hr tablet TAKE 1 TABLET (50 MG TOTAL) BY MOUTH DAILY. TAKE WITH OR IMMEDIATELY FOLLOWING A MEAL. 30 tablet 0   rosuvastatin (CRESTOR) 20 MG tablet Take 1 tablet (20 mg total) by mouth daily. 90 tablet 3   spironolactone (ALDACTONE) 25 MG tablet TAKE 1/2 TABLET (12.5 MG TOTAL) BY MOUTH DAILY. (Patient taking differently: Take 25 mg by mouth every other day.) 15 tablet 0   No current facility-administered medications on file prior to visit.    Allergies  Allergen Reactions   Actos [Pioglitazone] Swelling and Cough    SWELLING REACTION UNSPECIFIED  EDEMA    Family History  Problem Relation Age of Onset   Arthritis Father    Hypertension Father    Diabetes Father    Arthritis Mother    Hypertension Mother    Heart attack Brother 8   Diabetes Brother    Lymphoma Paternal Grandfather    Cancer Maternal Uncle    Cancer Paternal Uncle        type unknown    BP (!) 170/70    Pulse (!) 59    Ht 5\' 11"  (1.803 m)    Wt 230 lb 9.6 oz (104.6 kg)    SpO2 96%    BMI 32.16 kg/m    Review of Systems     Objective:   Physical Exam    A1c=8.0%    Assessment & Plan:  Insulin-requiring type 2 DM: uncontrolled.   Patient Instructions  Please continue to  follow up the blood pressure with cardiology.   I have sent a prescription to your pharmacy, to increase the Basaglar to 80 units each morning.   Please continue the same [place].  check your blood sugar twice a day.  vary the time of day when you check, between before the 3 meals, and at bedtime.  also check if you have symptoms of your blood sugar being too high or too low.  please keep a record of the readings and bring it to your next appointment here (or you can bring the meter itself).  You can write it on any piece of paper.  please call us sooner if your blood sugar goes below 70, or if you have a lot of readings over 200.   Please come back for a follow-up appointment in May.

## 2022-01-08 ENCOUNTER — Telehealth: Payer: Self-pay | Admitting: Orthopaedic Surgery

## 2022-01-08 NOTE — Telephone Encounter (Signed)
I spoke with Tiffany at [facility]. She states that they got the body culture that you needed and wants to be sure that there is nothing else that you want off of the E-Swab prior to them disposing of it. It is good until tomorrow. I advised we would call them back in the morning.    Please reference number [id number] when returning call.   Please advise.  Anything else needed off of E-swab?

## 2022-01-08 NOTE — Telephone Encounter (Signed)
Nothing special other than the bacterial cultures

## 2022-01-08 NOTE — Telephone Encounter (Signed)
Tiffany from [place] called about cultures on pt. Please return call to [phone] extension [phone].

## 2022-01-09 ENCOUNTER — Telehealth: Payer: Self-pay | Admitting: Cardiology

## 2022-01-09 NOTE — Telephone Encounter (Signed)
See result note.  

## 2022-01-09 NOTE — Telephone Encounter (Signed)
Patient is returning call to discuss lab results. 

## 2022-01-09 NOTE — Telephone Encounter (Signed)
I called Tiffany and advised.

## 2022-01-13 LAB — BODY FLUID CULTURE

## 2022-01-15 ENCOUNTER — Other Ambulatory Visit: Payer: Self-pay

## 2022-01-15 DIAGNOSIS — Z96652 Presence of left artificial knee joint: Secondary | ICD-10-CM

## 2022-01-15 NOTE — Progress Notes (Signed)
Called patient and went over lab results.  We need to order triple phase bone scan to look for loosening of the left TKA.  Thanks.

## 2022-01-19 DIAGNOSIS — Z23 Encounter for immunization: Secondary | ICD-10-CM | POA: Diagnosis not present

## 2022-01-19 DIAGNOSIS — L578 Other skin changes due to chronic exposure to nonionizing radiation: Secondary | ICD-10-CM | POA: Diagnosis not present

## 2022-01-19 DIAGNOSIS — I878 Other specified disorders of veins: Secondary | ICD-10-CM | POA: Diagnosis not present

## 2022-01-19 DIAGNOSIS — L570 Actinic keratosis: Secondary | ICD-10-CM | POA: Diagnosis not present

## 2022-01-19 DIAGNOSIS — L719 Rosacea, unspecified: Secondary | ICD-10-CM | POA: Diagnosis not present

## 2022-01-19 DIAGNOSIS — I8393 Asymptomatic varicose veins of bilateral lower extremities: Secondary | ICD-10-CM | POA: Diagnosis not present

## 2022-01-31 ENCOUNTER — Other Ambulatory Visit: Payer: Self-pay

## 2022-01-31 ENCOUNTER — Encounter: Payer: Self-pay | Admitting: Cardiology

## 2022-01-31 ENCOUNTER — Ambulatory Visit: Payer: BC Managed Care – PPO | Admitting: Cardiology

## 2022-01-31 VITALS — BP 144/84 | HR 86 | Ht 71.0 in | Wt 233.0 lb

## 2022-01-31 DIAGNOSIS — I4892 Unspecified atrial flutter: Secondary | ICD-10-CM

## 2022-01-31 DIAGNOSIS — I5022 Chronic systolic (congestive) heart failure: Secondary | ICD-10-CM | POA: Diagnosis not present

## 2022-01-31 DIAGNOSIS — I428 Other cardiomyopathies: Secondary | ICD-10-CM

## 2022-01-31 DIAGNOSIS — I452 Bifascicular block: Secondary | ICD-10-CM

## 2022-01-31 NOTE — Patient Instructions (Addendum)
Medication Instructions:  ?Your physician recommends that you continue on your current medications as directed. Please refer to the Current Medication list given to you today. ? ?*If you need a refill on your cardiac medications before your next appointment, please call your pharmacy ? ?**You may try OTC Flonase spray for post nasal drip ? ?Lab Work: ?None ordered. ? ?If you have labs (blood work) drawn today and your tests are completely normal, you will receive your results only by: ?MyChart Message (if you have MyChart) OR ?A paper copy in the mail ?If you have any lab test that is abnormal or we need to change your treatment, we will call you to review the results. ? ? ?Testing/Procedures: ?None ordered. ? ? ? ?Follow-Up: ?At [facility], you and your health needs are our priority.  As part of our continuing mission to provide you with exceptional heart care, we have created designated Provider Care Teams.  These Care Teams include your primary Cardiologist (physician) and Advanced Practice Providers (APPs -  Physician Assistants and Nurse Practitioners) who all work together to provide you with the care you need, when you need it. ? ?We recommend signing up for the patient portal called "MyChart".  Sign up information is provided on this After Visit Summary.  MyChart is used to connect with patients for Virtual Visits (Telemedicine).  Patients are able to view lab/test results, encounter notes, upcoming appointments, etc.  Non-urgent messages can be sent to your provider as well.   ?To learn more about what you can do with MyChart, go to [email or web address].   ? ?Your next appointment:   ?12 months with Dr Quentin Ore ?

## 2022-01-31 NOTE — Progress Notes (Signed)
?Electrophysiology Office Follow up Visit Note:   ? ?Date:  01/31/2022  ? ?ID:  Matthew Schmitt, DOB [birth year], MRN [id number] ? ?PCP:  Leonides Sake, MD  ?[facility] Cardiologist:  Candee Furbish, MD  ?[facility] Electrophysiologist:  Vickie Epley, MD  ? ? ?Interval History:   ? ?Matthew Schmitt is a 71 y.o. male who presents for a follow up visit. They were last seen in clinic 12/06/2021.  They underwent A-flutter Ablation on 10/27/2021. ? ?Overall, he is feeling better than before. His energy level is okay, and he has some shortness of breath with heavy exertion. Generally his breathing is good. ? ?At home his weight typically fluctuates from 220-230 lbs, averaging in the mid 220s. ? ?He was infected with COVID about 2-3 months ago. He developed a cough that continues to linger today. Also he complains of a lot of congestion that he attributes to his previous COVID infection. ? ?He denies any palpitations, chest pain, or peripheral edema. No lightheadedness, headaches, syncope, orthopnea, or PND. ? ? ? ?  ? ?Past Medical History:  ?Diagnosis Date  ? Anxiety   ? Arthritis   ? everywhere  ? Arthrosis of right acromioclavicular joint 01/29/2020  ? Atrial flutter ([place])   ? Onset May 2014 Cardioversion done   ? BPH associated with nocturia   ? BPH with obstruction/lower urinary tract symptoms 01/04/2017  ? CAD (coronary artery disease) 04/02/2013  ? Cath 2012 moderate mid LAD lesion, otherwise not much disease   ? Coronary artery disease   ? cardiologist-  dr Bettina Gavia (previouly dr Wynonia Lawman)  ? Diabetes ([place]) 01/18/2018  ? Encounter for long-term (current) use of other medications 07/27/2013  ? Essential hypertension, benign   ? First degree heart block   ? Heart murmur   ? History of bilateral knee replacement 02/07/2017  ? History of bladder stone   ? History of kidney stones   ? History of melanoma excision   ? June 2016  --- s/p  excision right  leg (per pt localized and no recurrence)  ? History of renal cell  carcinoma 01/2017  ? s/p  left partial nephrecotmy  ? Hypertensive heart disease 04/02/2013  ? Impingement syndrome of right shoulder 01/29/2020  ? Impotence of organic origin   ? Left-sided low back pain with left-sided sciatica 01/23/2019  ? Long term current use of anticoagulant therapy   ? Mixed hyperlipidemia   ? slightly  ? Multiple renal cysts   ? bilateral  ? Nephrolithiasis 05/17/2014  ? Nonischemic cardiomyopathy ([place])   ? Osteoarthritis   ? PAF (paroxysmal atrial fibrillation) ([place])   ? followed by cardiology  (takes ASA)  ? RBBB   ? RBBB (right bundle branch block with left anterior fascicular block) 04/02/2013  ? Right ureteral stone   ? Rotator cuff syndrome of left shoulder 04/17/2016  ? Status post total left knee replacement 01/01/2020  ? Tendinopathy of right biceps tendon 01/29/2020  ? Tendinopathy of right rotator cuff 01/29/2020  ? Type 2 diabetes mellitus treated with insulin ([place])   ? endocrinologist-  dr Loanne Drilling  ? Wears glasses   ? ? ?Past Surgical History:  ?Procedure Laterality Date  ? A-FLUTTER ABLATION N/A 10/27/2021  ? Procedure: A-FLUTTER ABLATION;  Surgeon: Vickie Epley, MD;  Location: [place] [facility];  Service: Cardiovascular;  Laterality: N/A;  ? CARDIAC CATHETERIZATION  08-17-2011  DR Tollie Eth  ? POSSIBLE MODERATE LAD STENOSIS JUST AFTER THE BIFURCATION OF LARGE  DIAGONAL BRANCH/ LVEF  40-45%/ MILD GLOBAL HYPODINESIS (CATH DONE FOR ABNORMAL STRESS TEST OF FIXED LATERAL WALL DEFECT & BIFASCICULAR BLAOCK)  ? CARDIOVERSION N/A 04/02/2013  ? Procedure: CARDIOVERSION;  Surgeon: Jacolyn Reedy, MD;  Location: [place];  Service: Cardiovascular;  Laterality: N/A;  ? CARDIOVERSION N/A 12/27/2020  ? Procedure: CARDIOVERSION;  Surgeon: Skeet Latch, MD;  Location: [place];  Service: Cardiovascular;  Laterality: N/A;  ? CYSTOSCOPY WITH LITHOLAPAXY N/A 05/18/2015  ? Procedure: CYSTOSCOPY WITH LITHOLAPAXY;  Surgeon: Rana Snare, MD;  Location: [facility];  Service:  Urology;  Laterality: N/A;  ? CYSTOSCOPY WITH LITHOLAPAXY N/A 01/04/2017  ? Procedure: CYSTOSCOPY WITH LITHOLAPAXY, Holmium Laser;  Surgeon: Ardis Hughs, MD;  Location: [facility];  Service: Urology;  Laterality: N/A;  ? CYSTOSCOPY WITH RETROGRADE PYELOGRAM, URETEROSCOPY AND STENT PLACEMENT  11/03/2012  ? Procedure: CYSTOSCOPY WITH RETROGRADE PYELOGRAM, URETEROSCOPY AND STENT PLACEMENT;  Surgeon: Bernestine Amass, MD;  Location: [facility];  Service: Urology;  Laterality: Left;  Cystoscopy/Left Retrograde/Ureteroscopy/Holmium Laser Litho/Double J Stent  ? CYSTOSCOPY WITH URETEROSCOPY, STONE BASKETRY AND STENT PLACEMENT Right 05/15/2019  ? Procedure: CYSTOSCOPY WITH URETEROSCOPY, LASER LITHOTRIPSY STONE BASKETRY AND STENT PLACEMENT, RIGHT RETROGRADE;  Surgeon: Ardis Hughs, MD;  Location: [facility];  Service: Urology;  Laterality: Right;  ? HOLMIUM LASER APPLICATION  42/59/5638  ? Procedure: HOLMIUM LASER APPLICATION;  Surgeon: Bernestine Amass, MD;  Location: [facility];  Service: Urology;  Laterality: Left;  ? HOLMIUM LASER APPLICATION N/A 7/56/4332  ? Procedure: HOLMIUM LASER APPLICATION;  Surgeon: Rana Snare, MD;  Location: [facility];  Service: Urology;  Laterality: N/A;  ? KNEE SURGERY Bilateral [place]  ? MELANOMA EXCISION Bilateral June 2016  ? 20th-- left leg //  1st-- right leg w/ skin graft from right thigh (no lymph node bx)  ? NEPHROLITHOTOMY Right 05/17/2014  ? Procedure: NEPHROLITHOTOMY PERCUTANEOUS;  Surgeon: Bernestine Amass, MD;  Location: [facility];  Service: Urology;  Laterality: Right;  ? PILONIDAL CYST EXCISION    ? ROBOTIC ASSITED PARTIAL NEPHRECTOMY Left 06/18/2013  ? Procedure: ROBOTIC ASSISTED PARTIAL NEPHRECTOMY;  Surgeon: Dutch Gray, MD;  Location: [facility];  Service: Urology;  Laterality: Left;  ? ROTATOR CUFF REPAIR Left 07/2016  ? SHOULDER ARTHROSCOPY WITH ROTATOR CUFF REPAIR AND SUBACROMIAL DECOMPRESSION Right 05/27/2020   ? Procedure: RIGHT SHOULDER ARTHROSCOPY WITH EXTENSIVE DEBRIDEMENT, DISTAL CLAVICLE EXCISION AND SUBACROMIAL DECOMPRESSION;  Surgeon: Leandrew Koyanagi, MD;  Location: [place];  Service: Orthopedics;  Laterality: Right;  ? TEE WITH CARDIOVERSION  12/27/2020  ? TEE WITHOUT CARDIOVERSION N/A 12/27/2020  ? Procedure: TRANSESOPHAGEAL ECHOCARDIOGRAM (TEE);  Surgeon: Skeet Latch, MD;  Location: [place];  Service: Cardiovascular;  Laterality: N/A;  ? TONSILLECTOMY  63  (age 74)  ? TOTAL KNEE ARTHROPLASTY Left 02/07/2017  ? Procedure: LEFT TOTAL KNEE ARTHROPLASTY;  Surgeon: Leandrew Koyanagi, MD;  Location: [place];  Service: Orthopedics;  Laterality: Left;  ? TOTAL KNEE ARTHROPLASTY Right 04/25/2017  ? Procedure: RIGHT TOTAL KNEE ARTHROPLASTY;  Surgeon: Leandrew Koyanagi, MD;  Location: [place];  Service: Orthopedics;  Laterality: Right;  ? TRANSURETHRAL RESECTION OF PROSTATE N/A 01/04/2017  ? Procedure: TRANSURETHRAL RESECTION OF THE PROSTATE (TURP);  Surgeon: Ardis Hughs, MD;  Location: [facility];  Service: Urology;  Laterality: N/A;  ? ? ?Current Medications: ?Current Meds  ?Medication Sig  ? dapagliflozin propanediol (FARXIGA) 10 MG TABS tablet Take 1 tablet (10  mg total) by mouth daily before breakfast.  ? Insulin Glargine (BASAGLAR KWIKPEN) 100 UNIT/ML Inject 80 Units into the skin every morning. And pen needles 1/day  ? Insulin Pen Needle (PEN NEEDLES) 30G X 8 MM MISC 1 each by Does not apply route daily. E11.9  ? Lancets (ONETOUCH DELICA PLUS [id number]) MISC 1 each by Other route 2 (two) times daily. E11.9  ? metoprolol succinate (TOPROL-XL) 50 MG 24 hr tablet TAKE 1 TABLET (50 MG TOTAL) BY MOUTH DAILY. TAKE WITH OR IMMEDIATELY FOLLOWING A MEAL.  ? nitroGLYCERIN (NITROSTAT) 0.4 MG SL tablet Place 0.4 mg under the tongue every 5 (five) minutes as needed for chest pain.  ? rivaroxaban (XARELTO) 20 MG TABS tablet Take 1 tablet (20 mg total) by mouth daily with supper.  ? rosuvastatin (CRESTOR) 20 MG  tablet Take 1 tablet (20 mg total) by mouth daily.  ? spironolactone (ALDACTONE) 25 MG tablet TAKE 1/2 TABLET (12.5 MG TOTAL) BY MOUTH DAILY. (Patient taking differently: Take 25 mg by mouth every other day.)  ? tr

## 2022-02-02 ENCOUNTER — Ambulatory Visit: Payer: BC Managed Care – PPO | Admitting: Cardiology

## 2022-02-02 ENCOUNTER — Encounter (HOSPITAL_COMMUNITY)
Admission: RE | Admit: 2022-02-02 | Discharge: 2022-02-02 | Disposition: A | Discharge status: Home / Self Care | Payer: BC Managed Care – PPO | Source: Ambulatory Visit | Attending: Orthopaedic Surgery | Admitting: Orthopaedic Surgery

## 2022-02-02 ENCOUNTER — Other Ambulatory Visit: Payer: Self-pay

## 2022-02-02 DIAGNOSIS — Z96641 Presence of right artificial hip joint: Secondary | ICD-10-CM | POA: Diagnosis not present

## 2022-02-02 DIAGNOSIS — Z96652 Presence of left artificial knee joint: Secondary | ICD-10-CM | POA: Diagnosis not present

## 2022-02-02 DIAGNOSIS — Z96653 Presence of artificial knee joint, bilateral: Secondary | ICD-10-CM | POA: Diagnosis not present

## 2022-02-02 DIAGNOSIS — R948 Abnormal results of function studies of other organs and systems: Secondary | ICD-10-CM | POA: Diagnosis not present

## 2022-02-02 DIAGNOSIS — M25562 Pain in left knee: Secondary | ICD-10-CM | POA: Diagnosis not present

## 2022-02-02 MED ORDER — TECHNETIUM TC 99M MEDRONATE IV KIT
21.7000 | PACK | Freq: Once | INTRAVENOUS | Status: AC
  Administered 2022-02-02: 21.7 via INTRAVENOUS

## 2022-02-07 ENCOUNTER — Other Ambulatory Visit: Payer: Self-pay

## 2022-02-07 ENCOUNTER — Encounter (HOSPITAL_COMMUNITY): Payer: Self-pay

## 2022-02-07 ENCOUNTER — Ambulatory Visit (HOSPITAL_COMMUNITY)
Admission: EM | Admit: 2022-02-07 | Discharge: 2022-02-07 | Disposition: A | Discharge status: Home / Self Care | Payer: BC Managed Care – PPO | Attending: Nurse Practitioner | Admitting: Nurse Practitioner

## 2022-02-07 ENCOUNTER — Ambulatory Visit (INDEPENDENT_AMBULATORY_CARE_PROVIDER_SITE_OTHER): Payer: BC Managed Care – PPO

## 2022-02-07 DIAGNOSIS — R051 Acute cough: Secondary | ICD-10-CM

## 2022-02-07 DIAGNOSIS — R059 Cough, unspecified: Secondary | ICD-10-CM

## 2022-02-07 DIAGNOSIS — J069 Acute upper respiratory infection, unspecified: Secondary | ICD-10-CM

## 2022-02-07 DIAGNOSIS — I700 Atherosclerosis of aorta: Secondary | ICD-10-CM | POA: Diagnosis not present

## 2022-02-07 MED ORDER — ALBUTEROL SULFATE HFA 108 (90 BASE) MCG/ACT IN AERS: 2.0000 | INHALATION_SPRAY | Freq: Four times a day (QID) | RESPIRATORY_TRACT | 2 refills | Status: DC | PRN

## 2022-02-07 MED ORDER — BENZONATATE 100 MG PO CAPS: 100.0000 mg | ORAL_CAPSULE | Freq: Three times a day (TID) | ORAL | 0 refills | Status: AC | PRN

## 2022-02-07 MED ORDER — AZITHROMYCIN 250 MG PO TABS: ORAL_TABLET | ORAL | 0 refills | Status: AC

## 2022-02-07 NOTE — ED Provider Notes (Signed)
[facility]    CSN: [id number] Arrival date & time: 02/07/22  9629      History   Chief Complaint Chief Complaint  Patient presents with   Cough    HPI Matthew Schmitt is a 71 y.o. male.   The patient is a 71 year old male who presents for cough and sinus drainage for 1 month.  Patient states the cough started when he had an onset of the sinus drainage.  He does have a history of seasonal allergies.  Also states he had a fever of 101 over the past weekend.  He also endorses wheezing with his cough, cough is productive of white sputum.  Patient states that he saw his cardiologist and the cardiologist recommended he use Flonase.  He does have a history of atrial flutter, but feels that he has not had any symptoms relating to his cough.  He denies nasal congestion, headache, myalgias, or GI symptoms.  Patient states that he did have COVID at the end of January.  He has received 3 COVID vaccines and his influenza vaccine.   Cough Associated symptoms: fever, rhinorrhea and wheezing   Associated symptoms: no shortness of breath and no sore throat    Past Medical History:  Diagnosis Date   Anxiety    Arthritis    everywhere   Arthrosis of right acromioclavicular joint 01/29/2020   Atrial flutter (HCC)    Onset May 2014 Cardioversion done    BPH associated with nocturia    BPH with obstruction/lower urinary tract symptoms 01/04/2017   CAD (coronary artery disease) 04/02/2013   Cath 2012 moderate mid LAD lesion, otherwise not much disease    Coronary artery disease    cardiologist-  dr Dulce Sellar (previouly dr Donnie Aho)   Diabetes [facility]) 01/18/2018   Encounter for long-term (current) use of other medications 07/27/2013   Essential hypertension, benign    First degree heart block    Heart murmur    History of bilateral knee replacement 02/07/2017   History of bladder stone    History of kidney stones    History of melanoma excision    June 2016  --- s/p  excision right  leg (per pt  localized and no recurrence)   History of renal cell carcinoma 01/2017   s/p  left partial nephrecotmy   Hypertensive heart disease 04/02/2013   Impingement syndrome of right shoulder 01/29/2020   Impotence of organic origin    Left-sided low back pain with left-sided sciatica 01/23/2019   Long term current use of anticoagulant therapy    Mixed hyperlipidemia    slightly   Multiple renal cysts    bilateral   Nephrolithiasis 05/17/2014   Nonischemic cardiomyopathy (HCC)    Osteoarthritis    PAF (paroxysmal atrial fibrillation) (HCC)    followed by cardiology  (takes ASA)   RBBB    RBBB (right bundle branch block with left anterior fascicular block) 04/02/2013   Right ureteral stone    Rotator cuff syndrome of left shoulder 04/17/2016   Status post total left knee replacement 01/01/2020   Tendinopathy of right biceps tendon 01/29/2020   Tendinopathy of right rotator cuff 01/29/2020   Type 2 diabetes mellitus treated with insulin [facility])    endocrinologist-  dr Everardo All   Wears glasses     Patient Active Problem List   Diagnosis Date Noted   Chronic systolic heart failure (HCC) 08/18/2021   Aortic stenosis 08/18/2021   Chronic kidney disease, stage 3a (HCC) 08/18/2021  Dilated aortic root (HCC) 08/18/2021   Bicuspid aortic valve 08/18/2021   AKI (acute kidney injury) (HCC) 12/29/2020   Hypokalemia 12/29/2020   Obstructive sleep apnea 12/29/2020   Thrombocytopenia (HCC) 12/29/2020   Hematuria 12/29/2020   Wears glasses    Type 2 diabetes mellitus treated with insulin (HCC)    Right ureteral stone    RBBB    PAF (paroxysmal atrial fibrillation) (HCC)    Multiple renal cysts    Impotence of organic origin    History of melanoma excision    History of kidney stones    History of bladder stone    Heart murmur    First degree heart block    Hypertension    BPH associated with nocturia    Arthritis    Anxiety    Tendinopathy of right rotator cuff 01/29/2020   Tendinopathy of right  biceps tendon 01/29/2020   Impingement syndrome of right shoulder 01/29/2020   Arthrosis of right acromioclavicular joint 01/29/2020   Chronic right shoulder pain 01/01/2020   Status post total left knee replacement 01/01/2020   Left-sided low back pain with left-sided sciatica 01/23/2019   Diabetes (HCC) 01/18/2018   History of bilateral knee replacement 02/07/2017   Primary osteoarthritis of left knee    History of renal cell carcinoma 01/2017   BPH with obstruction/lower urinary tract symptoms 01/04/2017   Rotator cuff syndrome of left shoulder 04/17/2016   Nephrolithiasis 05/17/2014   Encounter for long-term (current) use of other medications 07/27/2013   Long term current use of anticoagulant therapy    CAD (coronary artery disease) 04/02/2013   Hypertensive heart disease 04/02/2013   RBBB (right bundle branch block with left anterior fascicular block) 04/02/2013   Atrial flutter (HCC)    Nonischemic cardiomyopathy (HCC)    Obesity (BMI 30-39.9)    History of kidney stones    Osteoarthritis    Hyperlipidemia     Past Surgical History:  Procedure Laterality Date   A-FLUTTER ABLATION N/A 10/27/2021   Procedure: A-FLUTTER ABLATION;  Surgeon: Lanier Prude, MD;  Location: [facility] INVASIVE CV LAB;  Service: Cardiovascular;  Laterality: N/A;   CARDIAC CATHETERIZATION  08-17-2011  DR Karleen Hampshire TILLEY   POSSIBLE MODERATE LAD STENOSIS JUST AFTER THE BIFURCATION OF LARGE DIAGONAL BRANCH/ LVEF  40-45%/ MILD GLOBAL HYPODINESIS (CATH DONE FOR ABNORMAL STRESS TEST OF FIXED LATERAL WALL DEFECT & BIFASCICULAR BLAOCK)   CARDIOVERSION N/A 04/02/2013   Procedure: CARDIOVERSION;  Surgeon: Othella Boyer, MD;  Location: [facility] OR;  Service: Cardiovascular;  Laterality: N/A;   CARDIOVERSION N/A 12/27/2020   Procedure: CARDIOVERSION;  Surgeon: Chilton Si, MD;  Location: [facility] ENDOSCOPY;  Service: Cardiovascular;  Laterality: N/A;   CYSTOSCOPY WITH LITHOLAPAXY N/A 05/18/2015   Procedure: CYSTOSCOPY WITH  LITHOLAPAXY;  Surgeon: Barron Alvine, MD;  Location: [facility];  Service: Urology;  Laterality: N/A;   CYSTOSCOPY WITH LITHOLAPAXY N/A 01/04/2017   Procedure: CYSTOSCOPY WITH LITHOLAPAXY, Holmium Laser;  Surgeon: Crist Fat, MD;  Location: [facility] ORS;  Service: Urology;  Laterality: N/A;   CYSTOSCOPY WITH RETROGRADE PYELOGRAM, URETEROSCOPY AND STENT PLACEMENT  11/03/2012   Procedure: CYSTOSCOPY WITH RETROGRADE PYELOGRAM, URETEROSCOPY AND STENT PLACEMENT;  Surgeon: Valetta Fuller, MD;  Location: [facility];  Service: Urology;  Laterality: Left;  Cystoscopy/Left Retrograde/Ureteroscopy/Holmium Laser Litho/Double J Stent   CYSTOSCOPY WITH URETEROSCOPY, STONE BASKETRY AND STENT PLACEMENT Right 05/15/2019   Procedure: CYSTOSCOPY WITH URETEROSCOPY, LASER LITHOTRIPSY STONE BASKETRY AND STENT PLACEMENT, RIGHT RETROGRADE;  Surgeon: Berniece Salines  W, MD;  Location: [facility];  Service: Urology;  Laterality: Right;   HOLMIUM LASER APPLICATION  11/03/2012   Procedure: HOLMIUM LASER APPLICATION;  Surgeon: Valetta Fuller, MD;  Location: [facility];  Service: Urology;  Laterality: Left;   HOLMIUM LASER APPLICATION N/A 05/18/2015   Procedure: HOLMIUM LASER APPLICATION;  Surgeon: Barron Alvine, MD;  Location: [facility];  Service: Urology;  Laterality: N/A;   KNEE SURGERY Bilateral 1978  &  1974   MELANOMA EXCISION Bilateral June 2016   20th-- left leg //  1st-- right leg w/ skin graft from right thigh (no lymph node bx)   NEPHROLITHOTOMY Right 05/17/2014   Procedure: NEPHROLITHOTOMY PERCUTANEOUS;  Surgeon: Valetta Fuller, MD;  Location: [facility]S;  Service: Urology;  Laterality: Right;   PILONIDAL CYST EXCISION     ROBOTIC ASSITED PARTIAL NEPHRECTOMY Left 06/18/2013   Procedure: ROBOTIC ASSISTED PARTIAL NEPHRECTOMY;  Surgeon: [name], MD;  Location: [facility];  Service: Urology;  Laterality: Left;   ROTATOR CUFF REPAIR Left 07/2016    SHOULDER ARTHROSCOPY WITH ROTATOR CUFF REPAIR AND SUBACROMIAL DECOMPRESSION Right 05/27/2020   Procedure: RIGHT SHOULDER ARTHROSCOPY WITH EXTENSIVE DEBRIDEMENT, DISTAL CLAVICLE EXCISION AND SUBACROMIAL DECOMPRESSION;  Surgeon: Tarry Kos, MD;  Location: [facility];  Service: Orthopedics;  Laterality: Right;   TEE WITH CARDIOVERSION  12/27/2020   TEE WITHOUT CARDIOVERSION N/A 12/27/2020   Procedure: TRANSESOPHAGEAL ECHOCARDIOGRAM (TEE);  Surgeon: Chilton Si, MD;  Location: [facility] ENDOSCOPY;  Service: Cardiovascular;  Laterality: N/A;   TONSILLECTOMY  68  (age 38)   TOTAL KNEE ARTHROPLASTY Left 02/07/2017   Procedure: LEFT TOTAL KNEE ARTHROPLASTY;  Surgeon: Tarry Kos, MD;  Location: [facility] OR;  Service: Orthopedics;  Laterality: Left;   TOTAL KNEE ARTHROPLASTY Right 04/25/2017   Procedure: RIGHT TOTAL KNEE ARTHROPLASTY;  Surgeon: Tarry Kos, MD;  Location: [facility] OR;  Service: Orthopedics;  Laterality: Right;   TRANSURETHRAL RESECTION OF PROSTATE N/A 01/04/2017   Procedure: TRANSURETHRAL RESECTION OF THE PROSTATE (TURP);  Surgeon: Crist Fat, MD;  Location: [facility]S;  Service: Urology;  Laterality: N/A;       Home Medications    Prior to Admission medications   Medication Sig Start Date End Date Taking? Authorizing Provider  albuterol (VENTOLIN HFA) 108 (90 Base) MCG/ACT inhaler Inhale 2 puffs into the lungs every 6 (six) hours as needed for wheezing or shortness of breath. 02/07/22  Yes Leath-Warren, Sadie Haber, NP  azithromycin (ZITHROMAX Z-PAK) 250 MG tablet Take 2 tablets today, then take 1 tablet daily for the next 4 days. 02/07/22 02/12/22 Yes Leath-Warren, Sadie Haber, NP  benzonatate (TESSALON PERLES) 100 MG capsule Take 1 capsule (100 mg total) by mouth 3 (three) times daily as needed for up to 10 days for cough. 02/07/22 02/17/22 Yes Leath-Warren, Sadie Haber, NP  dapagliflozin propanediol (FARXIGA) 10 MG TABS tablet Take 1 tablet (10 mg total) by mouth daily before  breakfast. 12/29/20   Marjie Skiff E, PA-C  Insulin Glargine (BASAGLAR KWIKPEN) 100 UNIT/ML Inject 80 Units into the skin every morning. And pen needles 1/day 01/05/22   Romero Belling, MD  Insulin Pen Needle (PEN NEEDLES) 30G X 8 MM MISC 1 each by Does not apply route daily. E11.9 07/07/20   Romero Belling, MD  Lancets [facility] DELICA PLUS [place]) MISC 1 each by Other route 2 (two) times daily. E11.9 07/07/20   Romero Belling, MD  metoprolol succinate (TOPROL-XL) 50 MG 24 hr tablet TAKE 1  TABLET (50 MG TOTAL) BY MOUTH DAILY. TAKE WITH OR IMMEDIATELY FOLLOWING A MEAL. 12/29/20 01/31/22  Corrin Parker, PA-C  nitroGLYCERIN (NITROSTAT) 0.4 MG SL tablet Place 0.4 mg under the tongue every 5 (five) minutes as needed for chest pain.    [provider]  rivaroxaban (XARELTO) 20 MG TABS tablet Take 1 tablet (20 mg total) by mouth daily with supper. 08/18/21   Jake Bathe, MD  rosuvastatin (CRESTOR) 20 MG tablet Take 1 tablet (20 mg total) by mouth daily. 12/29/20 01/31/22  Corrin Parker, PA-C  spironolactone (ALDACTONE) 25 MG tablet TAKE 1/2 TABLET (12.5 MG TOTAL) BY MOUTH DAILY. Patient taking differently: Take 25 mg by mouth every other day. 12/29/20 01/31/22  Marjie Skiff E, PA-C  trolamine salicylate (ASPERCREME) 10 % cream Apply 1 application topically as needed for muscle pain.    [provider]    Family History Family History  Problem Relation Age of Onset   Arthritis Father    Hypertension Father    Diabetes Father    Arthritis Mother    Hypertension Mother    Heart attack Brother 52   Diabetes Brother    Lymphoma Paternal Grandfather    Cancer Maternal Uncle    Cancer Paternal Uncle        type unknown    Social History Social History   Tobacco Use   Smoking status: Former    Packs/day: 1.00    Years: 6.00    Pack years: 6.00    Types: Cigarettes    Quit date: 10/31/1969    Years since quitting: 52.3   Smokeless tobacco: Never  Vaping Use    Vaping Use: Never used  Substance Use Topics   Alcohol use: Yes    Alcohol/week: 0.0 standard drinks    Comment: OCCASIONAL   Drug use: No     Allergies   Actos [pioglitazone]   Review of Systems Review of Systems  Constitutional:  Positive for fatigue and fever.  HENT:  Positive for rhinorrhea. Negative for congestion, sinus pressure, sinus pain and sore throat.   Eyes: Negative.   Respiratory:  Positive for cough and wheezing. Negative for chest tightness and shortness of breath.   Cardiovascular: Negative.   Gastrointestinal: Negative.   Musculoskeletal: Negative.   Skin: Negative.   Allergic/Immunologic: Positive for environmental allergies.  Psychiatric/Behavioral: Negative.      Physical Exam Triage Vital Signs ED Triage Vitals  Enc Vitals Group     BP 02/07/22 0842 (!) 194/83     Pulse Rate 02/07/22 0842 61     Resp 02/07/22 0842 20     Temp 02/07/22 0842 98.1 F (36.7 C)     Temp Source 02/07/22 0842 Oral     SpO2 02/07/22 0842 95 %     Weight --      Height --      Head Circumference --      Peak Flow --      Pain Score 02/07/22 0844 0     Pain Loc --      Pain Edu? --      Excl. in GC? --    No data found.  Updated Vital Signs BP (!) 194/83 (BP Location: Right Arm)   Pulse 61   Temp 98.1 F (36.7 C) (Oral)   Resp 20   SpO2 95%   Visual Acuity Right Eye Distance:   Left Eye Distance:   Bilateral Distance:    Right Eye Near:  Left Eye Near:    Bilateral Near:     Physical Exam Vitals reviewed.  Constitutional:      General: He is not in acute distress.    Appearance: Normal appearance.  HENT:     Head: Normocephalic.     Right Ear: Tympanic membrane, ear canal and external ear normal.     Left Ear: Tympanic membrane, ear canal and external ear normal.     Nose: Rhinorrhea present.     Mouth/Throat:     Mouth: Mucous membranes are moist.     Pharynx: No oropharyngeal exudate or posterior oropharyngeal erythema.  Eyes:      Extraocular Movements: Extraocular movements intact.     Conjunctiva/sclera: Conjunctivae normal.     Pupils: Pupils are equal, round, and reactive to light.  Cardiovascular:     Rate and Rhythm: Normal rate and regular rhythm.     Pulses: Normal pulses.     Heart sounds: Normal heart sounds.  Pulmonary:     Effort: Pulmonary effort is normal. No respiratory distress.     Breath sounds: No decreased air movement. Examination of the right-upper field reveals wheezing. Examination of the left-upper field reveals wheezing. Wheezing and rales present. No rhonchi.     Comments: Wheezing noted in the anterior LUL and posterior RUL. No rales or rhonchi  Abdominal:     General: Bowel sounds are normal.     Palpations: Abdomen is soft.     Tenderness: There is no abdominal tenderness.  Musculoskeletal:     Cervical back: Normal range of motion and neck supple.  Skin:    General: Skin is warm and dry.     Capillary Refill: Capillary refill takes less than 2 seconds.  Neurological:     Mental Status: He is alert and oriented to person, place, and time.  Psychiatric:        Mood and Affect: Mood normal.        Behavior: Behavior normal.     UC Treatments / Results  Labs (all labs ordered are listed, but only abnormal results are displayed) Labs Reviewed - No data to display  EKG   Radiology DG Chest 2 View  Result Date: 02/07/2022 CLINICAL DATA:  71 year old male with history of cough for 1 month. EXAM: CHEST - 2 VIEW COMPARISON:  Chest x-ray 12/26/2020. FINDINGS: Lung volumes are normal. No consolidative airspace disease. No pleural effusions. No pneumothorax. No pulmonary nodule or mass noted. Pulmonary vasculature and the cardiomediastinal silhouette are within normal limits. Atherosclerosis in the thoracic aorta. IMPRESSION: 1. No radiographic evidence of acute cardiopulmonary disease. 2. Aortic atherosclerosis. Electronically Signed   By: Trudie Reed M.D.   On: 02/07/2022 09:17     Procedures Procedures (including critical care time)  Medications Ordered in UC Medications - No data to display  Initial Impression / Assessment and Plan / UC Course  I have reviewed the triage vital signs and the nursing notes.  Pertinent labs & imaging results that were available during my care of the patient were reviewed by me and considered in my medical decision making (see chart for details).  Patient is a 71 year old male who presents for cough and sinus symptoms for the past month.  Also exhibited fever approximately 2 days ago.  The patient has a productive cough with wheezing.  The patient is in no acute distress, his blood pressure is elevated.  We will also forego COVID testing as patient recently tested positive over 1 month ago.  We will not provide prescription for steroids at this time patient does have a history of type 1 diabetes.  Based on his symptoms, and past medical history, I am going to put the patient on azithromycin for 5 days.  We will also provide an albuterol inhaler to help with his wheezing.  We will also provide Tessalon Perles to help with the cough.  Patient encouraged to perform supportive care to include increasing rest and getting plenty of fluids.  He is also encouraged to continue using his Flonase.  Patient encouraged to follow-up with PCP if symptoms do not improve. Final Clinical Impressions(s) / UC Diagnoses   Final diagnoses:  Acute cough  Viral upper respiratory tract infection     Discharge Instructions      The chest x-ray was negative for pneumonia or any other respiratory condition. Take medication as prescribed. May take over-the-counter Tylenol as needed for pain, fever, or general discomfort. Continue using your Flonase, 2 sprays in each nostril daily until symptoms improve. Increase rest and get plenty of fluids. Follow-up with your primary care if symptoms do not improve.      ED Prescriptions     Medication Sig Dispense  Auth. Provider   azithromycin (ZITHROMAX Z-PAK) 250 MG tablet Take 2 tablets today, then take 1 tablet daily for the next 4 days. 6 each Leath-Warren, Sadie Haber, NP   benzonatate (TESSALON PERLES) 100 MG capsule Take 1 capsule (100 mg total) by mouth 3 (three) times daily as needed for up to 10 days for cough. 30 capsule Leath-Warren, Sadie Haber, NP   albuterol (VENTOLIN HFA) 108 (90 Base) MCG/ACT inhaler Inhale 2 puffs into the lungs every 6 (six) hours as needed for wheezing or shortness of breath. 8 g Leath-Warren, Sadie Haber, NP      PDMP not reviewed this encounter.   Abran Cantor, NP 02/07/22 [phone]

## 2022-02-07 NOTE — Discharge Instructions (Addendum)
The chest x-ray was negative for pneumonia or any other respiratory condition. ?Take medication as prescribed. ?May take over-the-counter Tylenol as needed for pain, fever, or general discomfort. ?Continue using your Flonase, 2 sprays in each nostril daily until symptoms improve. ?Increase rest and get plenty of fluids. ?Follow-up with your primary care if symptoms do not improve. ? ?

## 2022-02-07 NOTE — ED Triage Notes (Signed)
Pt presents today with cough and sinus drainage that's been going on for over a month. Pt states cardiologist recommended him to take Flonase. Pt states insulin got moved from 60 units to 80 units. Bp retaken 171/90.  ?

## 2022-02-08 ENCOUNTER — Other Ambulatory Visit: Payer: Self-pay | Admitting: Student

## 2022-02-14 ENCOUNTER — Other Ambulatory Visit: Payer: Self-pay

## 2022-02-14 ENCOUNTER — Ambulatory Visit: Payer: BC Managed Care – PPO | Admitting: Orthopaedic Surgery

## 2022-02-14 ENCOUNTER — Encounter: Payer: Self-pay | Admitting: Orthopaedic Surgery

## 2022-02-14 DIAGNOSIS — T84033A Mechanical loosening of internal left knee prosthetic joint, initial encounter: Secondary | ICD-10-CM

## 2022-02-14 NOTE — Progress Notes (Signed)
? ?Office Visit Note ?  ?Patient: Matthew Schmitt           ?Date of Birth: 1951-09-22           ?MRN: [id number] ?Visit Date: 02/14/2022 ?             ?Requested by: Leonides Sake, MD ?[address] ?[place],  [place] [address] ?PCP: Leonides Sake, MD ? ? ?Assessment & Plan: ?Visit Diagnoses:  ?1. Loosening of prosthesis of left total knee replacement, initial encounter ([place])   ? ? ?Plan: Eleazar returns today to discuss recent bone scan to look for loosening of the left knee replacement.  He is reporting a lot of start up pain especially within the first 50 feet. ? ?Examination of the left knee is unchanged. ? ?The bone scan does confirm that the tibial component is loose especially on the medial side.  The x-rays are consistent with loosening and subsidence of the component into varus.  These findings were reviewed with the patient detail and recommendation has been made for revision of the tibial component possible femoral component based on intraoperative findings.  His A1c from February 17 was 8.0 so we will need to have this rechecked the week of April 17 as it needs to be less than 7.8 so that his risks from diabetes are mitigated.  We will send clearance to Dr. Marlou Porch for surgery.  He is scheduled to see him soon anyways.  Details of the surgery including risk benefits prognosis reviewed with the patient detail. ? ?Follow-Up Instructions: No follow-ups on file.  ? ?Orders:  ?No orders of the defined types were placed in this encounter. ? ?No orders of the defined types were placed in this encounter. ? ? ? ? Procedures: ?No procedures performed ? ? ?Clinical Data: ?No additional findings. ? ? ?Subjective: ?Chief Complaint  ?Patient presents with  ? Left Knee - Pain, Follow-up  ? ? ?HPI ? ?Review of Systems ? ? ?Objective: ?Vital Signs: There were no vitals taken for this visit. ? ?Physical Exam ? ?Ortho Exam ? ?Specialty Comments:  ?No specialty comments available. ? ?Imaging: ?No results found. ? ? ?PMFS  History: ?Patient Active Problem List  ? Diagnosis Date Noted  ? Loose left total knee arthroplasty ([place]) 02/14/2022  ? Chronic systolic heart failure ([place]) 08/18/2021  ? Aortic stenosis 08/18/2021  ? Chronic kidney disease, stage 3a ([place]) 08/18/2021  ? Dilated aortic root ([place]) 08/18/2021  ? Bicuspid aortic valve 08/18/2021  ? AKI (acute kidney injury) ([place]) 12/29/2020  ? Hypokalemia 12/29/2020  ? Obstructive sleep apnea 12/29/2020  ? Thrombocytopenia ([place]) 12/29/2020  ? Hematuria 12/29/2020  ? Wears glasses   ? Type 2 diabetes mellitus treated with insulin ([place])   ? Right ureteral stone   ? RBBB   ? PAF (paroxysmal atrial fibrillation) ([place])   ? Multiple renal cysts   ? Impotence of organic origin   ? History of melanoma excision   ? History of kidney stones   ? History of bladder stone   ? Heart murmur   ? First degree heart block   ? Hypertension   ? BPH associated with nocturia   ? Arthritis   ? Anxiety   ? Tendinopathy of right rotator cuff 01/29/2020  ? Tendinopathy of right biceps tendon 01/29/2020  ? Impingement syndrome of right shoulder 01/29/2020  ? Arthrosis of right acromioclavicular joint 01/29/2020  ? Chronic right shoulder pain 01/01/2020  ? Status post total left knee  replacement 01/01/2020  ? Left-sided low back pain with left-sided sciatica 01/23/2019  ? Diabetes ([place]) 01/18/2018  ? History of bilateral knee replacement 02/07/2017  ? Primary osteoarthritis of left knee   ? History of renal cell carcinoma 01/2017  ? BPH with obstruction/lower urinary tract symptoms 01/04/2017  ? Rotator cuff syndrome of left shoulder 04/17/2016  ? Nephrolithiasis 05/17/2014  ? Encounter for long-term (current) use of other medications 07/27/2013  ? Long term current use of anticoagulant therapy   ? CAD (coronary artery disease) 04/02/2013  ? Hypertensive heart disease 04/02/2013  ? RBBB (right bundle branch block with left anterior fascicular block) 04/02/2013  ? Atrial flutter ([place])   ? Nonischemic cardiomyopathy  ([place])   ? Obesity (BMI 30-39.9)   ? History of kidney stones   ? Osteoarthritis   ? Hyperlipidemia   ? ?Past Medical History:  ?Diagnosis Date  ? Anxiety   ? Arthritis   ? everywhere  ? Arthrosis of right acromioclavicular joint 01/29/2020  ? Atrial flutter ([place])   ? Onset May 2014 Cardioversion done   ? BPH associated with nocturia   ? BPH with obstruction/lower urinary tract symptoms 01/04/2017  ? CAD (coronary artery disease) 04/02/2013  ? Cath 2012 moderate mid LAD lesion, otherwise not much disease   ? Coronary artery disease   ? cardiologist-  dr Bettina Gavia (previouly dr Wynonia Lawman)  ? Diabetes ([place]) 01/18/2018  ? Encounter for long-term (current) use of other medications 07/27/2013  ? Essential hypertension, benign   ? First degree heart block   ? Heart murmur   ? History of bilateral knee replacement 02/07/2017  ? History of bladder stone   ? History of kidney stones   ? History of melanoma excision   ? June 2016  --- s/p  excision right  leg (per pt localized and no recurrence)  ? History of renal cell carcinoma 01/2017  ? s/p  left partial nephrecotmy  ? Hypertensive heart disease 04/02/2013  ? Impingement syndrome of right shoulder 01/29/2020  ? Impotence of organic origin   ? Left-sided low back pain with left-sided sciatica 01/23/2019  ? Long term current use of anticoagulant therapy   ? Mixed hyperlipidemia   ? slightly  ? Multiple renal cysts   ? bilateral  ? Nephrolithiasis 05/17/2014  ? Nonischemic cardiomyopathy ([place])   ? Osteoarthritis   ? PAF (paroxysmal atrial fibrillation) ([place])   ? followed by cardiology  (takes ASA)  ? RBBB   ? RBBB (right bundle branch block with left anterior fascicular block) 04/02/2013  ? Right ureteral stone   ? Rotator cuff syndrome of left shoulder 04/17/2016  ? Status post total left knee replacement 01/01/2020  ? Tendinopathy of right biceps tendon 01/29/2020  ? Tendinopathy of right rotator cuff 01/29/2020  ? Type 2 diabetes mellitus treated with insulin ([place])   ? endocrinologist-  dr  Loanne Drilling  ? Wears glasses   ?  ?Family History  ?Problem Relation Age of Onset  ? Arthritis Father   ? Hypertension Father   ? Diabetes Father   ? Arthritis Mother   ? Hypertension Mother   ? Heart attack Brother 15  ? Diabetes Brother   ? Lymphoma Paternal Grandfather   ? Cancer Maternal Uncle   ? Cancer Paternal Uncle   ?     type unknown  ?  ?Past Surgical History:  ?Procedure Laterality Date  ? A-FLUTTER ABLATION N/A 10/27/2021  ? Procedure: A-FLUTTER ABLATION;  Surgeon: Vickie Epley, MD;  Location: [facility];  Service: Cardiovascular;  Laterality: N/A;  ? CARDIAC CATHETERIZATION  08-17-2011  DR Tollie Eth  ? POSSIBLE MODERATE LAD STENOSIS JUST AFTER THE BIFURCATION OF LARGE DIAGONAL BRANCH/ LVEF  40-45%/ MILD GLOBAL HYPODINESIS (CATH DONE FOR ABNORMAL STRESS TEST OF FIXED LATERAL WALL DEFECT & BIFASCICULAR BLAOCK)  ? CARDIOVERSION N/A 04/02/2013  ? Procedure: CARDIOVERSION;  Surgeon: Jacolyn Reedy, MD;  Location: [facility];  Service: Cardiovascular;  Laterality: N/A;  ? CARDIOVERSION N/A 12/27/2020  ? Procedure: CARDIOVERSION;  Surgeon: Skeet Latch, MD;  Location: [facility];  Service: Cardiovascular;  Laterality: N/A;  ? CYSTOSCOPY WITH LITHOLAPAXY N/A 05/18/2015  ? Procedure: CYSTOSCOPY WITH LITHOLAPAXY;  Surgeon: Rana Snare, MD;  Location: [facility];  Service: Urology;  Laterality: N/A;  ? CYSTOSCOPY WITH LITHOLAPAXY N/A 01/04/2017  ? Procedure: CYSTOSCOPY WITH LITHOLAPAXY, Holmium Laser;  Surgeon: Ardis Hughs, MD;  [facility];  Service: Urology;  Laterality: N/A;  ? CYSTOSCOPY WITH RETROGRADE PYELOGRAM, URETEROSCOPY AND STENT PLACEMENT  11/03/2012  ? Procedure: CYSTOSCOPY WITH RETROGRADE PYELOGRAM, URETEROSCOPY AND STENT PLACEMENT;  Surgeon: Bernestine Amass, MD;  Location: [facility];  Service: Urology;  Laterality: Left;  Cystoscopy/Left Retrograde/Ureteroscopy/Holmium Laser Litho/Double J Stent  ? CYSTOSCOPY WITH URETEROSCOPY, STONE  BASKETRY AND STENT PLACEMENT Right 05/15/2019  ? Procedure: CYSTOSCOPY WITH URETEROSCOPY, LASER LITHOTRIPSY STONE BASKETRY AND [place], RIGHT RETROGRADE;  Surgeon: Ardis Hughs, MD;  Location: [facility]

## 2022-02-20 ENCOUNTER — Encounter: Payer: Self-pay | Admitting: Cardiology

## 2022-02-20 ENCOUNTER — Ambulatory Visit: Payer: BC Managed Care – PPO | Admitting: Cardiology

## 2022-02-20 DIAGNOSIS — Q231 Congenital insufficiency of aortic valve: Secondary | ICD-10-CM

## 2022-02-20 DIAGNOSIS — I4891 Unspecified atrial fibrillation: Secondary | ICD-10-CM | POA: Insufficient documentation

## 2022-02-20 DIAGNOSIS — I350 Nonrheumatic aortic (valve) stenosis: Secondary | ICD-10-CM

## 2022-02-20 DIAGNOSIS — Z01810 Encounter for preprocedural cardiovascular examination: Secondary | ICD-10-CM

## 2022-02-20 DIAGNOSIS — I5022 Chronic systolic (congestive) heart failure: Secondary | ICD-10-CM

## 2022-02-20 DIAGNOSIS — I77810 Thoracic aortic ectasia: Secondary | ICD-10-CM | POA: Diagnosis not present

## 2022-02-20 DIAGNOSIS — I452 Bifascicular block: Secondary | ICD-10-CM

## 2022-02-20 DIAGNOSIS — I483 Typical atrial flutter: Secondary | ICD-10-CM | POA: Diagnosis not present

## 2022-02-20 MED ORDER — RIVAROXABAN 20 MG PO TABS: 20.0000 mg | ORAL_TABLET | Freq: Every day | ORAL | 3 refills | Status: DC

## 2022-02-20 MED ORDER — ENTRESTO 49-51 MG PO TABS: 1.0000 | ORAL_TABLET | Freq: Two times a day (BID) | ORAL | 3 refills | Status: DC

## 2022-02-20 NOTE — Assessment & Plan Note (Signed)
No high risk symptoms such as syncope. ?

## 2022-02-20 NOTE — Assessment & Plan Note (Signed)
For some reason his Entresto fell off of his list.  We are restarting moderate dose 49/51.  Continue with Farxiga, spironolactone, Toprol.  He did ask if he could come off of some of his medications however his blood pressure today was 166/80.  He is also seen excellent improvement in his ejection fraction from 20 to 25% up to 50 to 55%.  He still feeling some shortness of breath with activity.  NYHA class I-II. ?

## 2022-02-20 NOTE — Assessment & Plan Note (Signed)
Mild to moderate aortic stenosis with V-max of 3 m/s.  We will continue to monitor.  Mean gradient was 19 mmHg.  EF 50 to 55%.  We will plan on repeating echocardiogram next year. ?

## 2022-02-20 NOTE — Assessment & Plan Note (Signed)
>>  ASSESSMENT AND PLAN FOR CHRONIC SYSTOLIC HEART FAILURE (HCC) WRITTEN ON 02/20/2022  8:59 AM BY SKAINS, MARK C, MD  For some reason his Entresto fell off of his list.  We are restarting moderate dose 49/51.  Continue with Farxiga, spironolactone, Toprol.  He did ask if he could come off of some of his medications however his blood pressure today was 166/80.  He is also seen excellent improvement in his ejection fraction from 20 to 25% up to 50 to 55%.  He still feeling some shortness of breath with activity.  NYHA class I-II.

## 2022-02-20 NOTE — Assessment & Plan Note (Signed)
Bicuspid valve noted with fusion and calcification of the left and right coronary cusp on TEE 12/27/2020. ?

## 2022-02-20 NOTE — Assessment & Plan Note (Signed)
Needs to have knee operated on by orthopedics.  He may proceed with surgery with low overall cardiac risk.  He may hold his Xarelto for 3 days prior given spinal anesthesia.  Resume 1 day postop. ?

## 2022-02-20 NOTE — Assessment & Plan Note (Signed)
41 mm on TEE in 2022.  Continue to monitor. ?

## 2022-02-20 NOTE — Patient Instructions (Signed)
Medication Instructions:  ?Please start Entresto 49-51 mg one tablet twice a day. ?Continue all other medications as listed. ? ?*If you need a refill on your cardiac medications before your next appointment, please call your pharmacy* ? ?Follow-Up: ?At [facility], you and your health needs are our priority.  As part of our continuing mission to provide you with exceptional heart care, we have created designated Provider Care Teams.  These Care Teams include your primary Cardiologist (physician) and Advanced Practice Providers (APPs -  Physician Assistants and Nurse Practitioners) who all work together to provide you with the care you need, when you need it. ? ?We recommend signing up for the patient portal called "MyChart".  Sign up information is provided on this After Visit Summary.  MyChart is used to connect with patients for Virtual Visits (Telemedicine).  Patients are able to view lab/test results, encounter notes, upcoming appointments, etc.  Non-urgent messages can be sent to your provider as well.   ?To learn more about what you can do with MyChart, go to [email or web address].   ? ?Your next appointment:   ?6 month(s) ? ?The format for your next appointment:   ?In Person ? ?Provider:   ?Nicholes Rough, PA-C, Melina Copa, PA-C, Ermalinda Barrios, PA-C, Christen Bame, NP, or Richardson Dopp, PA-C       ? ? ?Thank you for choosing [facility]!! ? ? ? ?

## 2022-02-20 NOTE — Progress Notes (Addendum)
?Cardiology Office Note:   ? ?Date:  02/20/2022  ? ?ID:  Matthew Schmitt, DOB 03-Apr-1951, MRN [id number] ? ?PCP:  Leonides Sake, MD ?  ?[facility] Providers ?Cardiologist:  Candee Furbish, MD ?Electrophysiologist:  Vickie Epley, MD    ? ?Referring MD: Leonides Sake, MD  ? ? ?History of Present Illness:   ? ?Matthew Schmitt is a 71 y.o. male here for the follow-up of CAD, atrial fibrillation, and systolic heart failure with prior EF 20-25%. On repeat Echo 12/2021 after conversion of atrial flutter his EF improved to 50-55%.  ? ?He underwent A flutter ablation on 10/27/2021. At his visit with Dr. Quentin Ore 01/31/2022 there was no recurrence of atrial fibrillation noted. ? ?He was seen in the ED/hospitalization on 12/26/20 for atrial flutter.  Had previously had DC cardioversion.  He was found to be in 2-1 atrial flutter with heart rates in the 160s tried beta-blocker.  In the ER his heart rates were in the 130s.  He was given IV amiodarone his echo showed a reduction in ejection fraction of 20 to 25%.  We went ahead and did a TEE cardioversion and he is maintaining sinus rhythm.  P.o. amiodarone.  Eliquis.  With his conduction disease, right bundle branch block left anterior fascicular block first-degree AV block we will watch for signs of worsening conduction. ? ?With his EF reduction to we diuresed.  He was switched to [facility] in the hospital stay as well as Toprol.  Farxiga 10 mg also.  Spironolactone 12.5. ? ?Baseline creatinine has been approximately 1.4-1.5. ? ?He is doing better since his hospital visit.He is taking 5 mg  Eliquis PO BID and reports bruising easily. However, it is nothing he is concerned about. His DM is being followed by ENDO.  ? ?At his last appointment he was doing well and reported generally controlled blood pressures at home. He also missed evening doses of his medicine occasionally. ? ?Today, ?Overall, he is feeling basically okay. He complains of mild shortness of breath with  activity.  ? ?Also he notes his blood pressure has been "up and down" lately. He has seen readings in the 140s-150s. There have been no recent medication changes, and no low blood pressures. ? ?He denies any palpitations, chest pain, or peripheral edema. No lightheadedness, headaches, syncope, orthopnea, or PND.  ? ?Additionally he requests a change of his Xarelto to a 90 day supply. Every week he may miss 1-2 doses of his medication, but is otherwise compliant. ? ? ?Past Medical History:  ?Diagnosis Date  ? Anxiety   ? Arthritis   ? everywhere  ? Arthrosis of right acromioclavicular joint 01/29/2020  ? Atrial flutter ([place])   ? Onset May 2014 Cardioversion done   ? BPH associated with nocturia   ? BPH with obstruction/lower urinary tract symptoms 01/04/2017  ? CAD (coronary artery disease) 04/02/2013  ? Cath 2012 moderate mid LAD lesion, otherwise not much disease   ? Coronary artery disease   ? cardiologist-  dr Bettina Gavia (previouly dr Wynonia Lawman)  ? Diabetes ([place]) 01/18/2018  ? Encounter for long-term (current) use of other medications 07/27/2013  ? Essential hypertension, benign   ? First degree heart block   ? Heart murmur   ? History of bilateral knee replacement 02/07/2017  ? History of bladder stone   ? History of kidney stones   ? History of melanoma excision   ? June 2016  --- s/p  excision right  leg (per pt  localized and no recurrence)  ? History of renal cell carcinoma 01/2017  ? s/p  left partial nephrecotmy  ? Hypertensive heart disease 04/02/2013  ? Impingement syndrome of right shoulder 01/29/2020  ? Impotence of organic origin   ? Left-sided low back pain with left-sided sciatica 01/23/2019  ? Long term current use of anticoagulant therapy   ? Mixed hyperlipidemia   ? slightly  ? Multiple renal cysts   ? bilateral  ? Nephrolithiasis 05/17/2014  ? Nonischemic cardiomyopathy ([place])   ? Osteoarthritis   ? PAF (paroxysmal atrial fibrillation) ([place])   ? followed by cardiology  (takes ASA)  ? RBBB   ? RBBB (right bundle  branch block with left anterior fascicular block) 04/02/2013  ? Right ureteral stone   ? Rotator cuff syndrome of left shoulder 04/17/2016  ? Status post total left knee replacement 01/01/2020  ? Tendinopathy of right biceps tendon 01/29/2020  ? Tendinopathy of right rotator cuff 01/29/2020  ? Type 2 diabetes mellitus treated with insulin ([place])   ? endocrinologist-  dr Loanne Drilling  ? Wears glasses   ? ? ?Past Surgical History:  ?Procedure Laterality Date  ? A-FLUTTER ABLATION N/A 10/27/2021  ? Procedure: A-FLUTTER ABLATION;  Surgeon: Vickie Epley, MD;  Location: [facility];  Service: Cardiovascular;  Laterality: N/A;  ? CARDIAC CATHETERIZATION  08-17-2011  DR Tollie Eth  ? POSSIBLE MODERATE LAD STENOSIS JUST AFTER THE BIFURCATION OF LARGE DIAGONAL BRANCH/ LVEF  40-45%/ MILD GLOBAL HYPODINESIS (CATH DONE FOR ABNORMAL STRESS TEST OF FIXED LATERAL WALL DEFECT & BIFASCICULAR BLAOCK)  ? CARDIOVERSION N/A 04/02/2013  ? Procedure: CARDIOVERSION;  Surgeon: Jacolyn Reedy, MD;  Location: [facility];  Service: Cardiovascular;  Laterality: N/A;  ? CARDIOVERSION N/A 12/27/2020  ? Procedure: CARDIOVERSION;  Surgeon: Skeet Latch, MD;  Location: [facility];  Service: Cardiovascular;  Laterality: N/A;  ? CYSTOSCOPY WITH LITHOLAPAXY N/A 05/18/2015  ? Procedure: CYSTOSCOPY WITH LITHOLAPAXY;  Surgeon: Rana Snare, MD;  Location: [facility];  Service: Urology;  Laterality: N/A;  ? CYSTOSCOPY WITH LITHOLAPAXY N/A 01/04/2017  ? Procedure: CYSTOSCOPY WITH LITHOLAPAXY, Holmium Laser;  Surgeon: Ardis Hughs, MD;  [facility];  Service: Urology;  Laterality: N/A;  ? CYSTOSCOPY WITH RETROGRADE PYELOGRAM, URETEROSCOPY AND STENT PLACEMENT  11/03/2012  ? Procedure: CYSTOSCOPY WITH RETROGRADE PYELOGRAM, URETEROSCOPY AND STENT PLACEMENT;  Surgeon: Bernestine Amass, MD;  Location: [facility];  Service: Urology;  Laterality: Left;  Cystoscopy/Left Retrograde/Ureteroscopy/Holmium Laser Litho/Double J  Stent  ? CYSTOSCOPY WITH URETEROSCOPY, STONE BASKETRY AND STENT PLACEMENT Right 05/15/2019  ? Procedure: CYSTOSCOPY WITH URETEROSCOPY, LASER LITHOTRIPSY STONE BASKETRY AND STENT PLACEMENT, RIGHT RETROGRADE;  Surgeon: Ardis Hughs, MD;  Location: [facility];  Service: Urology;  Laterality: Right;  ? HOLMIUM LASER APPLICATION  70/96/2836  ? Procedure: HOLMIUM LASER APPLICATION;  Surgeon: Bernestine Amass, MD;  Location: [facility];  Service: Urology;  Laterality: Left;  ? HOLMIUM LASER APPLICATION N/A 05/17/4764  ? Procedure: HOLMIUM LASER APPLICATION;  Surgeon: Rana Snare, MD;  Location[facility];  Service: Urology;  Laterality: N/A;  ? KNEE SURGERY Bilateral [place]  ? MELANOMA EXCISION Bilateral June 2016  ? 20th-- left leg //  1st-- right leg w/ skin graft from right thigh (no lymph node bx)  ? NEPHROLITHOTOMY Right 05/17/2014  ? Procedure: NEPHROLITHOTOMY PERCUTANEOUS;  Surgeon: Bernestine Amass, MD;  [facility];  Service: Urology;  Laterality: Right;  ?  PILONIDAL CYST EXCISION    ? ROBOTIC ASSITED PARTIAL NEPHRECTOMY Left 06/18/2013  ? Procedure: ROBOTIC ASSISTED PARTIAL NEPHRECTOMY;  Surgeon: Dutch Gray, MD;  [facility];  Service: Urology;  Laterality: Left;  ? ROTATOR CUFF REPAIR Left 07/2016  ? SHOULDER ARTHROSCOPY WITH ROTATOR CUFF REPAIR AND SUBACROMIAL DECOMPRESSION Right 05/27/2020  ? Procedure: RIGHT SHOULDER ARTHROSCOPY WITH EXTENSIVE DEBRIDEMENT, DISTAL CLAVICLE EXCISION AND SUBACROMIAL DECOMPRESSION;  Surgeon: Leandrew Koyanagi, MD;  Location: [facility];  Service: Orthopedics;  Laterality: Right;  ? TEE WITH CARDIOVERSION  12/27/2020  ? TEE WITHOUT CARDIOVERSION N/A 12/27/2020  ? Procedure: TRANSESOPHAGEAL ECHOCARDIOGRAM (TEE);  Surgeon: Skeet Latch, MD;  Location: [facility];  Service: Cardiovascular;  Laterality: N/A;  ? TONSILLECTOMY  83  (age 1)  ? TOTAL KNEE ARTHROPLASTY Left 02/07/2017  ? Procedure: LEFT TOTAL  KNEE ARTHROPLASTY;  Surgeon: Leandrew Koyanagi, MD;  Location: [facility];  Service: Orthopedics;  Laterality: Left;  ? TOTAL KNEE ARTHROPLASTY Right 04/25/2017  ? Procedure: RIGHT TOTAL KNEE ARTHROPLASTY;  Surgeon: Dani Gobble

## 2022-02-20 NOTE — Assessment & Plan Note (Signed)
Continue with Xarelto.  We will change his prescription to 90 days, [facility].  ?

## 2022-02-20 NOTE — Assessment & Plan Note (Signed)
Prior ablation Dr. Quentin Ore 2022, doing very well.  No recurrence.  Continuing with Xarelto for stroke prophylaxis.  Asymptomatic.  Likely had in part a tachycardia mediated cardiomyopathy as well.  No bleeding. ?

## 2022-03-06 ENCOUNTER — Ambulatory Visit: Payer: BC Managed Care – PPO | Admitting: Orthopaedic Surgery

## 2022-03-06 DIAGNOSIS — T84033A Mechanical loosening of internal left knee prosthetic joint, initial encounter: Secondary | ICD-10-CM | POA: Diagnosis not present

## 2022-03-06 DIAGNOSIS — E119 Type 2 diabetes mellitus without complications: Secondary | ICD-10-CM | POA: Diagnosis not present

## 2022-03-06 NOTE — Progress Notes (Signed)
A1c drawn today for upcoming surgery. ?

## 2022-03-07 ENCOUNTER — Telehealth: Payer: Self-pay | Admitting: Orthopaedic Surgery

## 2022-03-07 LAB — HEMOGLOBIN A1C
Hgb A1c MFr Bld: 8.5 % of total Hgb — ABNORMAL HIGH (ref <5.7)
Mean Plasma Glucose: 197 mg/dL
eAG (mmol/L): 10.9 mmol/L

## 2022-03-07 NOTE — Progress Notes (Signed)
Dr. Loanne Drilling, ?Matthew Schmitt is a mutual patient and his A1c came back 8.5 so we will have to postpone his surgery until it is under 7.8.  I spoke to him this morning and he's aware and in agreement with postponing the surgery.  He would like to get this corrected as soon as possible.  He was wondering if you would be able to see him earlier than 04/06/22.  Thank you for your help in this matter.  Have a good day.

## 2022-03-10 NOTE — Telephone Encounter (Signed)
Lab draw

## 2022-03-29 ENCOUNTER — Other Ambulatory Visit: Payer: Self-pay | Admitting: Endocrinology

## 2022-03-29 ENCOUNTER — Other Ambulatory Visit: Payer: Self-pay | Admitting: Student

## 2022-03-30 MED ORDER — DAPAGLIFLOZIN PROPANEDIOL 10 MG PO TABS: 10.0000 mg | ORAL_TABLET | Freq: Every day | ORAL | 3 refills | Status: DC

## 2022-04-06 ENCOUNTER — Ambulatory Visit (INDEPENDENT_AMBULATORY_CARE_PROVIDER_SITE_OTHER): Payer: BC Managed Care – PPO

## 2022-04-06 ENCOUNTER — Ambulatory Visit: Payer: BC Managed Care – PPO | Admitting: Endocrinology

## 2022-04-06 DIAGNOSIS — T84033A Mechanical loosening of internal left knee prosthetic joint, initial encounter: Secondary | ICD-10-CM | POA: Diagnosis not present

## 2022-04-06 DIAGNOSIS — E119 Type 2 diabetes mellitus without complications: Secondary | ICD-10-CM | POA: Diagnosis not present

## 2022-04-06 NOTE — Progress Notes (Signed)
Patient came for a1c labwork this morning.

## 2022-04-07 LAB — HEMOGLOBIN A1C
Hgb A1c MFr Bld: 8.6 % of total Hgb — ABNORMAL HIGH (ref <5.7)
Mean Plasma Glucose: 200 mg/dL
eAG (mmol/L): 11.1 mmol/L

## 2022-04-09 NOTE — Progress Notes (Signed)
Please let him know A1c is still elevated.

## 2022-04-09 NOTE — Progress Notes (Signed)
Called and notified patient. He will continue to try and get it lower.

## 2022-04-18 DIAGNOSIS — H25813 Combined forms of age-related cataract, bilateral: Secondary | ICD-10-CM | POA: Diagnosis not present

## 2022-04-18 DIAGNOSIS — E119 Type 2 diabetes mellitus without complications: Secondary | ICD-10-CM | POA: Diagnosis not present

## 2022-04-18 DIAGNOSIS — Z794 Long term (current) use of insulin: Secondary | ICD-10-CM | POA: Diagnosis not present

## 2022-04-25 LAB — LAB REPORT - SCANNED
A1c: 8.5
EGFR: 71

## 2022-04-26 ENCOUNTER — Telehealth: Payer: Self-pay

## 2022-04-26 ENCOUNTER — Other Ambulatory Visit: Payer: Self-pay | Admitting: Internal Medicine

## 2022-04-26 DIAGNOSIS — Z794 Long term (current) use of insulin: Secondary | ICD-10-CM

## 2022-04-26 DIAGNOSIS — E1159 Type 2 diabetes mellitus with other circulatory complications: Secondary | ICD-10-CM

## 2022-04-26 NOTE — Telephone Encounter (Signed)
Prior Dr. Cordelia Pen patient is requesting an outgoing referral to be sent to:  [facility], [place], [place] [address]  Due to not wanting to wait to schedule an appointment too far out

## 2022-05-03 ENCOUNTER — Telehealth: Payer: Self-pay | Admitting: Orthopaedic Surgery

## 2022-05-03 NOTE — Telephone Encounter (Signed)
Pt called requesting a call back for nurse visit. Pt would like to get blood work done for [employer]. Please call pt at [phone].

## 2022-05-04 ENCOUNTER — Other Ambulatory Visit: Payer: Self-pay

## 2022-05-04 DIAGNOSIS — M1711 Unilateral primary osteoarthritis, right knee: Secondary | ICD-10-CM | POA: Diagnosis not present

## 2022-05-04 DIAGNOSIS — E089 Diabetes mellitus due to underlying condition without complications: Secondary | ICD-10-CM

## 2022-05-05 LAB — EXTRA SPECIMEN

## 2022-05-05 LAB — HEMOGLOBIN A1C
Hgb A1c MFr Bld: 7.9 % of total Hgb — ABNORMAL HIGH (ref <5.7)
Mean Plasma Glucose: 180 mg/dL
eAG (mmol/L): 10 mmol/L

## 2022-05-07 NOTE — Progress Notes (Signed)
Yes, he's almost there.  I'm sure another month he'll be good to go.

## 2022-05-07 NOTE — Progress Notes (Signed)
Can you let him know that his A1C was 7.9.  tell him he is close and to come back in one month for a recheck

## 2022-05-07 NOTE — Progress Notes (Signed)
Ok, want him to come back in one month for recheck then, or wait the typical 3 months?

## 2022-05-07 NOTE — Progress Notes (Signed)
Called patient. He will follow up in a month for another a1c check.

## 2022-05-07 NOTE — Progress Notes (Signed)
1 month

## 2022-05-07 NOTE — Progress Notes (Signed)
Xu, still want under 7.8, correct?

## 2022-05-10 ENCOUNTER — Ambulatory Visit (HOSPITAL_COMMUNITY): Payer: BC Managed Care – PPO

## 2022-05-10 ENCOUNTER — Encounter (HOSPITAL_COMMUNITY): Payer: Self-pay

## 2022-06-13 ENCOUNTER — Ambulatory Visit (INDEPENDENT_AMBULATORY_CARE_PROVIDER_SITE_OTHER): Payer: BC Managed Care – PPO

## 2022-06-13 DIAGNOSIS — M1711 Unilateral primary osteoarthritis, right knee: Secondary | ICD-10-CM

## 2022-06-14 LAB — HEMOGLOBIN A1C
Hgb A1c MFr Bld: 7.4 % of total Hgb — ABNORMAL HIGH (ref <5.7)
Mean Plasma Glucose: 166 mg/dL
eAG (mmol/L): 9.2 mmol/L

## 2022-06-14 NOTE — Progress Notes (Signed)
A1c is good for surgery.  No more than 3 surgeries on the day we schedule him please.  Thanks.

## 2022-06-19 ENCOUNTER — Telehealth: Payer: Self-pay | Admitting: Orthopaedic Surgery

## 2022-06-19 ENCOUNTER — Telehealth: Payer: Self-pay | Admitting: *Deleted

## 2022-06-19 NOTE — Telephone Encounter (Signed)
Erlinda Hong just spoke to patient

## 2022-06-19 NOTE — Telephone Encounter (Signed)
   Pre-operative Risk Assessment    Patient Name: Matthew Schmitt  DOB: 1951/08/11 MRN: [id number]      Request for Surgical Clearance    Procedure:   LEFT TOTAL KNEE REVISION  Date of Surgery:  Clearance TBD                                 Surgeon:  DR. Marianna Payment Surgeon's Group or Practice Name:  [facility] Phone number:  [phone] Fax number:  [phone] ATTN DEBBIE   Type of Clearance Requested:   - Medical  - Pharmacy:  Hold Rivaroxaban (Xarelto) x 3 DAYS PRIOR   Type of Anesthesia:  Spinal   Additional requests/questions:    Jiles Prows   06/19/2022, 5:55 PM

## 2022-06-19 NOTE — Telephone Encounter (Signed)
Patient called asking if the results for his A1C have come back yet that were drawn last week. CB # [phone]

## 2022-06-20 ENCOUNTER — Telehealth: Payer: Self-pay | Admitting: *Deleted

## 2022-06-20 NOTE — Telephone Encounter (Signed)
Pt has been scheduled for a tele visit, 06/26/22.

## 2022-06-20 NOTE — Telephone Encounter (Signed)
   Name: Matthew Schmitt  DOB: 1951-07-08  MRN: [id number]  Primary Cardiologist: Candee Furbish, MD  Chart reviewed as part of pre-operative protocol coverage. Because of [name]'s past medical history and time since last visit, he will require a follow-up tele visit in order to better assess preoperative cardiovascular risk.  Pre-op covering staff: - Please schedule appointment and call patient to inform them. If patient already had an upcoming appointment within acceptable timeframe, please add "pre-op clearance" to the appointment notes so provider is aware. - Please contact requesting surgeon's office via preferred method (i.e, phone, fax) to inform them of need for appointment prior to surgery.  Routed to pharmacy for input on Xarelto.  Elgie Collard, PA-C  06/20/2022, 8:36 AM

## 2022-06-20 NOTE — Telephone Encounter (Signed)
Pt has been scheduled for a tele visit, 06/26/22 2:40.  Consent on file / medications reconciled.    Patient Consent for Virtual Visit        Matthew Schmitt has provided verbal consent on 06/20/2022 for a virtual visit (video or telephone).   CONSENT FOR VIRTUAL VISIT FOR:  Matthew Schmitt  By participating in this virtual visit I agree to the following:  I hereby voluntarily request, consent and authorize [facility] and its employed or contracted physicians, physician assistants, nurse practitioners or other licensed health care professionals (the Practitioner), to provide me with telemedicine health care services (the "Services") as deemed necessary by the treating Practitioner. I acknowledge and consent to receive the Services by the Practitioner via telemedicine. I understand that the telemedicine visit will involve communicating with the Practitioner through live audiovisual communication technology and the disclosure of certain medical information by electronic transmission. I acknowledge that I have been given the opportunity to request an in-person assessment or other available alternative prior to the telemedicine visit and am voluntarily participating in the telemedicine visit.  I understand that I have the right to withhold or withdraw my consent to the use of telemedicine in the course of my care at any time, without affecting my right to future care or treatment, and that the Practitioner or I may terminate the telemedicine visit at any time. I understand that I have the right to inspect all information obtained and/or recorded in the course of the telemedicine visit and may receive copies of available information for a reasonable fee.  I understand that some of the potential risks of receiving the Services via telemedicine include:  Delay or interruption in medical evaluation due to technological equipment failure or disruption; Information transmitted may not be sufficient (e.g. poor  resolution of images) to allow for appropriate medical decision making by the Practitioner; and/or  In rare instances, security protocols could fail, causing a breach of personal health information.  Furthermore, I acknowledge that it is my responsibility to provide information about my medical history, conditions and care that is complete and accurate to the best of my ability. I acknowledge that Practitioner's advice, recommendations, and/or decision may be based on factors not within their control, such as incomplete or inaccurate data provided by me or distortions of diagnostic images or specimens that may result from electronic transmissions. I understand that the practice of medicine is not an exact science and that Practitioner makes no warranties or guarantees regarding treatment outcomes. I acknowledge that a copy of this consent can be made available to me via my patient portal ([facility]), or I can request a printed copy by calling the office of [facility].    I understand that my insurance will be billed for this visit.   I have read or had this consent read to me. I understand the contents of this consent, which adequately explains the benefits and risks of the Services being provided via telemedicine.  I have been provided ample opportunity to ask questions regarding this consent and the Services and have had my questions answered to my satisfaction. I give my informed consent for the services to be provided through the use of telemedicine in my medical care

## 2022-06-20 NOTE — Telephone Encounter (Signed)
Patient with diagnosis of aflutter on Xarelto for anticoagulation.    Procedure:  LEFT TOTAL KNEE REVISION Date of procedure: TBD   CHA2DS2-VASc Score =     This indicates a  % annual risk of stroke. The patient's score is based upon:       CrCl 72 ml/min  Per office protocol, patient can hold Xarelto for 3 days prior to procedure.    **This guidance is not considered finalized until pre-operative APP has relayed final recommendations.**

## 2022-06-25 NOTE — Progress Notes (Unsigned)
Virtual Visit via Telephone Note   Because of Matthew Schmitt's co-morbid illnesses, he is at least at moderate risk for complications without adequate follow up.  This format is felt to be most appropriate for this patient at this time.  The patient did not have access to video technology/had technical difficulties with video requiring transitioning to audio format only (telephone).  All issues noted in this document were discussed and addressed.  No physical exam could be performed with this format.  Please refer to the patient's chart for his consent to telehealth for [facility].  Evaluation Performed:  Preoperative cardiovascular risk assessment _____________   Date:  06/25/2022   Patient ID:  Matthew Schmitt, DOB 16-Dec-1950, MRN [id number] Patient Location:  Home Provider location:   Office  Primary Care Provider:  Leonides Sake, MD Primary Cardiologist:  Candee Furbish, MD  Chief Complaint / Patient Profile   71 y.o. y/o male with a h/o CAD, atrial fibrillation s/p ablation 10/27/21, chronic HFrEF with prior EF 20-25%, on repeat echo after cardioversion of atrial flutter EF improved to 50-55%, hypertension, RBBB, type 2 DM, who is pending left total knee revision and presents today for telephonic preoperative cardiovascular risk assessment.  Past Medical History    Past Medical History:  Diagnosis Date   Anxiety    Arthritis    everywhere   Arthrosis of right acromioclavicular joint 01/29/2020   Atrial flutter [facility])    Onset May 2014 Cardioversion done    BPH associated with nocturia    BPH with obstruction/lower urinary tract symptoms 01/04/2017   CAD (coronary artery disease) 04/02/2013   Cath 2012 moderate mid LAD lesion, otherwise not much disease    Coronary artery disease    cardiologist-  dr Bettina Gavia (previouly dr Wynonia Lawman)   Diabetes [facility]) 01/18/2018   Encounter for long-term (current) use of other medications 07/27/2013   Essential hypertension, benign    First degree  heart block    Heart murmur    History of bilateral knee replacement 02/07/2017   History of bladder stone    History of kidney stones    History of melanoma excision    June 2016  --- s/p  excision right  leg (per pt localized and no recurrence)   History of renal cell carcinoma 01/2017   s/p  left partial nephrecotmy   Hypertensive heart disease 04/02/2013   Impingement syndrome of right shoulder 01/29/2020   Impotence of organic origin    Left-sided low back pain with left-sided sciatica 01/23/2019   Long term current use of anticoagulant therapy    Mixed hyperlipidemia    slightly   Multiple renal cysts    bilateral   Nephrolithiasis 05/17/2014   Nonischemic cardiomyopathy (HCC)    Osteoarthritis    PAF (paroxysmal atrial fibrillation) ([place])    followed by cardiology  (takes ASA)   RBBB    RBBB (right bundle branch block with left anterior fascicular block) 04/02/2013   Right ureteral stone    Rotator cuff syndrome of left shoulder 04/17/2016   Status post total left knee replacement 01/01/2020   Tendinopathy of right biceps tendon 01/29/2020   Tendinopathy of right rotator cuff 01/29/2020   Type 2 diabetes mellitus treated with insulin [facility])    endocrinologist-  dr Loanne Drilling   Wears glasses    Past Surgical History:  Procedure Laterality Date   A-FLUTTER ABLATION N/A 10/27/2021   Procedure: A-FLUTTER ABLATION;  Surgeon: Vickie Epley, MD;  Location: [facility]  INVASIVE CV LAB;  Service: Cardiovascular;  Laterality: N/A;   CARDIAC CATHETERIZATION  08-17-2011  DR Frederico Hamman [place]   POSSIBLE MODERATE LAD STENOSIS JUST AFTER THE BIFURCATION OF LARGE DIAGONAL BRANCH/ LVEF  40-45%/ MILD GLOBAL HYPODINESIS (CATH DONE FOR ABNORMAL STRESS TEST OF FIXED LATERAL WALL DEFECT & BIFASCICULAR BLAOCK)   CARDIOVERSION N/A 04/02/2013   Procedure: CARDIOVERSION;  Surgeon: Jacolyn Reedy, MD;  Location: [facility];  Service: Cardiovascular;  Laterality: N/A;   CARDIOVERSION N/A 12/27/2020   Procedure:  CARDIOVERSION;  Surgeon: Skeet Latch, MD;  Location: [facility];  Service: Cardiovascular;  Laterality: N/A;   CYSTOSCOPY WITH LITHOLAPAXY N/A 05/18/2015   Procedure: CYSTOSCOPY WITH LITHOLAPAXY;  Surgeon: Rana Snare, MD;  Location: [facility];  Service: Urology;  Laterality: N/A;   CYSTOSCOPY WITH LITHOLAPAXY N/A 01/04/2017   Procedure: CYSTOSCOPY WITH LITHOLAPAXY, Holmium Laser;  Surgeon: Ardis Hughs, MD;  Location: [facility];  Service: Urology;  Laterality: N/A;   CYSTOSCOPY WITH RETROGRADE PYELOGRAM, URETEROSCOPY AND STENT PLACEMENT  11/03/2012   Procedure: CYSTOSCOPY WITH RETROGRADE PYELOGRAM, URETEROSCOPY AND STENT PLACEMENT;  Surgeon: Bernestine Amass, MD;  Location: [facility];  Service: Urology;  Laterality: Left;  Cystoscopy/Left Retrograde/Ureteroscopy/Holmium Laser Litho/Double J Stent   CYSTOSCOPY WITH URETEROSCOPY, STONE BASKETRY AND STENT PLACEMENT Right 05/15/2019   Procedure: CYSTOSCOPY WITH URETEROSCOPY, LASER LITHOTRIPSY STONE BASKETRY AND STENT PLACEMENT, RIGHT RETROGRADE;  Surgeon: Ardis Hughs, MD;  Location[facility];  Service: Urology;  Laterality: Right;   HOLMIUM LASER APPLICATION  73/22/0254   Procedure: HOLMIUM LASER APPLICATION;  Surgeon: Bernestine Amass, MD;  Location: [facility];  Service: Urology;  Laterality: Left;   HOLMIUM LASER APPLICATION N/A 2/70/6237   Procedure: HOLMIUM LASER APPLICATION;  Surgeon: Rana Snare, MD;  Location: [facility];  Service: Urology;  Laterality: N/A;   KNEE SURGERY Bilateral [place] Bilateral June 2016   20th-- left leg //  1st-- right leg w/ skin graft from right thigh (no lymph node bx)   NEPHROLITHOTOMY Right 05/17/2014   Procedure: NEPHROLITHOTOMY PERCUTANEOUS;  Surgeon: Bernestine Amass, MD;  Location: [facility];  Service: Urology;  Laterality: Right;   PILONIDAL CYST EXCISION     ROBOTIC ASSITED PARTIAL  NEPHRECTOMY Left 06/18/2013   Procedure: ROBOTIC ASSISTED PARTIAL NEPHRECTOMY;  Surgeon: Dutch Gray, MD;  Location: [facility];  Service: Urology;  Laterality: Left;   ROTATOR CUFF REPAIR Left 07/2016   SHOULDER ARTHROSCOPY WITH ROTATOR CUFF REPAIR AND SUBACROMIAL DECOMPRESSION Right 05/27/2020   Procedure: RIGHT SHOULDER ARTHROSCOPY WITH EXTENSIVE DEBRIDEMENT, DISTAL CLAVICLE EXCISION AND SUBACROMIAL DECOMPRESSION;  Surgeon: Leandrew Koyanagi, MD;  Location: [facility];  Service: Orthopedics;  Laterality: Right;   TEE WITH CARDIOVERSION  12/27/2020   TEE WITHOUT CARDIOVERSION N/A 12/27/2020   Procedure: TRANSESOPHAGEAL ECHOCARDIOGRAM (TEE);  Surgeon: Skeet Latch, MD;  Location: [facility];  Service: Cardiovascular;  Laterality: N/A;   TONSILLECTOMY  74  (age 73)   TOTAL KNEE ARTHROPLASTY Left 02/07/2017   Procedure: LEFT TOTAL KNEE ARTHROPLASTY;  Surgeon: Leandrew Koyanagi, MD;  Location: [facility];  Service: Orthopedics;  Laterality: Left;   TOTAL KNEE ARTHROPLASTY Right 04/25/2017   Procedure: RIGHT TOTAL KNEE ARTHROPLASTY;  Surgeon: Leandrew Koyanagi, MD;  Location: [facility];  Service: Orthopedics;  Laterality: Right;   TRANSURETHRAL RESECTION OF PROSTATE N/A 01/04/2017   Procedure: TRANSURETHRAL RESECTION OF THE PROSTATE (TURP);  Surgeon: Ardis Hughs, MD;  Location:  [facility];  Service: Urology;  Laterality: N/A;    Allergies  Allergies  Allergen Reactions   Actos [Pioglitazone] Swelling and Cough    SWELLING REACTION UNSPECIFIED  EDEMA    History of Present Illness    Matthew Schmitt is a 71 y.o. male who presents via audio/video conferencing for a telehealth visit today.  Pt was last seen in cardiology clinic on 02/20/22 by Dr. Marlou Porch.  At that time Matthew Schmitt was started on Entresto and advised to follow-up in 6 months. The patient is now pending procedure as outlined above. Since his last visit, he  denies chest pain, shortness of breath, lower extremity edema, fatigue,  palpitations, melena, hematuria, hemoptysis, diaphoresis, weakness, presyncope, syncope, orthopnea, and PND.  Home Medications    Prior to Admission medications   Medication Sig Start Date End Date Taking? Authorizing Provider  dapagliflozin propanediol (FARXIGA) 10 MG TABS tablet Take 1 tablet (10 mg total) by mouth daily before breakfast. 03/30/22   Jerline Pain, MD  Insulin Glargine (BASAGLAR KWIKPEN) 100 UNIT/ML Inject 80 Units into the skin every morning. And pen needles 1/day Patient taking differently: Inject 90 Units into the skin every morning. And pen needles 1/day 01/05/22   Renato Shin, MD  Insulin Pen Needle (PEN NEEDLES) 30G X 8 MM MISC 1 each by Does not apply route daily. E11.9 07/07/20   Renato Shin, MD  Lancets [facility] DELICA PLUS [id number]) [place] 1 each by Other route 2 (two) times daily. E11.9 07/07/20   Renato Shin, MD  metoprolol succinate (TOPROL-XL) 50 MG 24 hr tablet TAKE 1 TABLET DAILY, TAKE WITH OR IMMEDIATELY FOLLOWING A MEAL 03/30/22   Jerline Pain, MD  nitroGLYCERIN (NITROSTAT) 0.4 MG SL tablet Place 0.4 mg under the tongue every 5 (five) minutes as needed for chest pain.    [provider]  [facility] ULTRA test strip USE 1 STRIP TWICE A DAY 03/30/22   Shamleffer, Melanie Crazier, MD  rivaroxaban (XARELTO) 20 MG TABS tablet Take 1 tablet (20 mg total) by mouth daily with supper. 02/20/22   Jerline Pain, MD  rosuvastatin (CRESTOR) 20 MG tablet TAKE 1 TABLET DAILY 02/09/22   Jerline Pain, MD  sacubitril-valsartan (ENTRESTO) 49-51 MG Take 1 tablet by mouth 2 (two) times daily. 02/20/22   Jerline Pain, MD  spironolactone (ALDACTONE) 25 MG tablet TAKE ONE-HALF (1/2) TABLET DAILY 02/09/22   Jerline Pain, MD  trolamine salicylate (ASPERCREME) 10 % cream Apply 1 application topically as needed for muscle pain.    [provider]    Physical Exam    Vital Signs:  Matthew Schmitt does not have vital signs available for review today.  Given  telephonic nature of communication, physical exam is limited. AAOx3. NAD. Normal affect.  Speech and respirations are unlabored.  Accessory Clinical Findings    None  Assessment & Plan    1.  Preoperative Cardiovascular Risk Assessment: The patient is doing well from a cardiac perspective. Therefore, based on ACC/AHA guidelines, the patient would be at acceptable risk for the planned procedure without further cardiovascular testing. The patient was advised that if he develops new symptoms prior to surgery to contact our office to arrange for a follow-up visit, and he verbalized understanding. According to the Revised Cardiac Risk Index (RCRI), his Perioperative Risk of Major Cardiac Event is (%): 6.6. His Functional Capacity in METs is: 7.59 according to the [facility] Activity Status Index (DASI). Per office protocol, patient can hold  Xarelto for 3 days prior to procedure.    A copy of this note will be routed to requesting surgeon.  Time:   Today, I have spent 8 minutes with the patient with telehealth technology discussing medical history, symptoms, and management plan.    Emmaline Life, NP-C    06/25/2022, 5:01 PM [place] [address], [address] Office [phone] Fax [phone]

## 2022-06-26 ENCOUNTER — Encounter: Payer: Self-pay | Admitting: Nurse Practitioner

## 2022-06-26 ENCOUNTER — Ambulatory Visit (INDEPENDENT_AMBULATORY_CARE_PROVIDER_SITE_OTHER): Payer: BC Managed Care – PPO | Admitting: Nurse Practitioner

## 2022-06-26 DIAGNOSIS — Z01810 Encounter for preprocedural cardiovascular examination: Secondary | ICD-10-CM

## 2022-07-02 ENCOUNTER — Ambulatory Visit (INDEPENDENT_AMBULATORY_CARE_PROVIDER_SITE_OTHER): Payer: BC Managed Care – PPO

## 2022-07-02 ENCOUNTER — Ambulatory Visit: Payer: BC Managed Care – PPO

## 2022-07-02 DIAGNOSIS — T84033A Mechanical loosening of internal left knee prosthetic joint, initial encounter: Secondary | ICD-10-CM

## 2022-07-04 ENCOUNTER — Other Ambulatory Visit: Payer: Self-pay

## 2022-07-06 ENCOUNTER — Ambulatory Visit: Payer: BC Managed Care – PPO | Admitting: Internal Medicine

## 2022-07-06 ENCOUNTER — Encounter: Payer: Self-pay | Admitting: Internal Medicine

## 2022-07-06 VITALS — BP 138/90 | HR 73 | Ht 71.0 in | Wt 229.8 lb

## 2022-07-06 DIAGNOSIS — E785 Hyperlipidemia, unspecified: Secondary | ICD-10-CM

## 2022-07-06 DIAGNOSIS — E1165 Type 2 diabetes mellitus with hyperglycemia: Secondary | ICD-10-CM | POA: Diagnosis not present

## 2022-07-06 DIAGNOSIS — E1159 Type 2 diabetes mellitus with other circulatory complications: Secondary | ICD-10-CM

## 2022-07-06 MED ORDER — RYBELSUS 3 MG PO TABS: 3.0000 mg | ORAL_TABLET | Freq: Every day | ORAL | 1 refills | Status: DC

## 2022-07-06 NOTE — Progress Notes (Signed)
Patient ID: Matthew Schmitt, male   DOB: 03/30/1951, 71 y.o.   MRN: [id number]  HPI: Matthew Schmitt is a 71 y.o.-year-old male, returning for follow-up for DM2, dx in 2002, insulin-dependent since 2017, uncontrolled, with complications (CAD, nonischemic cardiomyopathy, atrial flutter/PAF). Pt. previously saw Dr. Loanne Drilling, last visit 6 months ago.  He has knee surgery coming up next week.  Goal A1c for this is lower than 8%.  We will go He has an appt with urology soon for a higher PSA.  Reviewed HbA1c: Lab Results  Component Value Date   HGBA1C 7.4 (H) 06/13/2022   HGBA1C 7.9 (H) 05/04/2022   HGBA1C 8.6 (H) 04/06/2022   HGBA1C 8.5 (H) 03/06/2022   HGBA1C 8.0 (A) 01/05/2022   HGBA1C 6.4 (A) 08/04/2021   HGBA1C 6.4 (A) 03/24/2021   HGBA1C 8.3 (A) 12/20/2020   HGBA1C 6.7 (A) 08/19/2020   HGBA1C 6.9 (A) 04/22/2020   Pt is on a regimen of: - Farxiga 10 mg in am - Basaglar 90 units at bedtime He developed cough and edema from Actos. He tried Metformin >> gained weight.   Pt checks his sugars 1x a day and they are: - am: 92-260 (missed meds) - 2h after b'fast: n/c - before lunch: n/c - 2h after lunch: n/c - before dinner: n/c - 2h after dinner: n/c - bedtime: n/c - nighttime: n/c Lowest sugar was 22  - years ago; more recently: 92. Highest sugar was 312.  Glucometer: One Touch ultra  - + Mild CKD, last BUN/creatinine:  04/24/2022: Glu 64, BUN/cr 21/1.11 Lab Results  Component Value Date   BUN 26 10/05/2021   BUN 22 08/18/2021   CREATININE 1.15 10/05/2021   CREATININE 1.21 08/18/2021  On Entresto.  -+ HL; last set of lipids: 04/28/2022: 127/86/38/72 Lab Results  Component Value Date   CHOL 149 12/19/2020   HDL 42 12/19/2020   LDLCALC 79 12/19/2020   TRIG 165 (H) 12/19/2020   CHOLHDL 3.5 12/19/2020  He is on Crestor 20 mg daily.  - last eye exam was in 2020. No DR reportedly.   - no numbness and tingling in his feet.  Last foot exam 08/04/2021.  He also has a  history of nephrolithiasis, right bundle branch block.  ROS: + see HPI No increased urination, blurry vision, nausea, chest pain.  Past Medical History:  Diagnosis Date   Anxiety    Arthritis    everywhere   Arthrosis of right acromioclavicular joint 01/29/2020   Atrial flutter [facility])    Onset May 2014 Cardioversion done    BPH associated with nocturia    BPH with obstruction/lower urinary tract symptoms 01/04/2017   CAD (coronary artery disease) 04/02/2013   Cath 2012 moderate mid LAD lesion, otherwise not much disease    Coronary artery disease    cardiologist-  dr Bettina Gavia (previouly dr Wynonia Lawman)   Diabetes [facility]) 01/18/2018   Encounter for long-term (current) use of other medications 07/27/2013   Essential hypertension, benign    First degree heart block    Heart murmur    History of bilateral knee replacement 02/07/2017   History of bladder stone    History of kidney stones    History of melanoma excision    June 2016  --- s/p  excision right  leg (per pt localized and no recurrence)   History of renal cell carcinoma 01/2017   s/p  left partial nephrecotmy   Hypertensive heart disease 04/02/2013   Impingement syndrome of right shoulder 01/29/2020  Impotence of organic origin    Left-sided low back pain with left-sided sciatica 01/23/2019   Long term current use of anticoagulant therapy    Mixed hyperlipidemia    slightly   Multiple renal cysts    bilateral   Nephrolithiasis 05/17/2014   Nonischemic cardiomyopathy (HCC)    Osteoarthritis    PAF (paroxysmal atrial fibrillation) ([place])    followed by cardiology  (takes ASA)   RBBB    RBBB (right bundle branch block with left anterior fascicular block) 04/02/2013   Right ureteral stone    Rotator cuff syndrome of left shoulder 04/17/2016   Status post total left knee replacement 01/01/2020   Tendinopathy of right biceps tendon 01/29/2020   Tendinopathy of right rotator cuff 01/29/2020   Type 2 diabetes mellitus treated with insulin  [facility])    endocrinologist-  dr Loanne Drilling   Wears glasses    Past Surgical History:  Procedure Laterality Date   A-FLUTTER ABLATION N/A 10/27/2021   Procedure: A-FLUTTER ABLATION;  Surgeon: Vickie Epley, MD;  Location: [facility] [facility];  Service: Cardiovascular;  Laterality: N/A;   CARDIAC CATHETERIZATION  08-17-2011  DR Frederico Hamman [place]   POSSIBLE MODERATE LAD STENOSIS JUST AFTER THE BIFURCATION OF LARGE DIAGONAL BRANCH/ LVEF  40-45%/ MILD GLOBAL HYPODINESIS (CATH DONE FOR ABNORMAL STRESS TEST OF FIXED LATERAL WALL DEFECT & BIFASCICULAR BLAOCK)   CARDIOVERSION N/A 04/02/2013   Procedure: CARDIOVERSION;  Surgeon: Jacolyn Reedy, MD;  Location: [place];  Service: Cardiovascular;  Laterality: N/A;   CARDIOVERSION N/A 12/27/2020   Procedure: CARDIOVERSION;  Surgeon: Skeet Latch, MD;  Location: [place];  Service: Cardiovascular;  Laterality: N/A;   CYSTOSCOPY WITH LITHOLAPAXY N/A 05/18/2015   Procedure: CYSTOSCOPY WITH LITHOLAPAXY;  Surgeon: Rana Snare, MD;  Location: [facility];  Service: Urology;  Laterality: N/A;   CYSTOSCOPY WITH LITHOLAPAXY N/A 01/04/2017   Procedure: CYSTOSCOPY WITH LITHOLAPAXY, Holmium Laser;  Surgeon: Ardis Hughs, MD;  Location: [facility];  Service: Urology;  Laterality: N/A;   CYSTOSCOPY WITH RETROGRADE PYELOGRAM, URETEROSCOPY AND STENT PLACEMENT  11/03/2012   Procedure: CYSTOSCOPY WITH RETROGRADE PYELOGRAM, URETEROSCOPY AND STENT PLACEMENT;  Surgeon: Bernestine Amass, MD;  Location: [facility];  Service: Urology;  Laterality: Left;  Cystoscopy/Left Retrograde/Ureteroscopy/Holmium Laser Litho/Double J Stent   CYSTOSCOPY WITH URETEROSCOPY, STONE BASKETRY AND STENT PLACEMENT Right 05/15/2019   Procedure: CYSTOSCOPY WITH URETEROSCOPY, LASER LITHOTRIPSY STONE BASKETRY AND STENT PLACEMENT, RIGHT RETROGRADE;  Surgeon: Ardis Hughs, MD;  Location: [facility];  Service: Urology;  Laterality: Right;   HOLMIUM LASER  APPLICATION  16/08/9603   Procedure: HOLMIUM LASER APPLICATION;  Surgeon: Bernestine Amass, MD;  Location: [facility];  Service: Urology;  Laterality: Left;   HOLMIUM LASER APPLICATION N/A 5/40/9811   Procedure: HOLMIUM LASER APPLICATION;  Surgeon: Rana Snare, MD;  Location: [facility];  Service: Urology;  Laterality: N/A;   KNEE SURGERY Bilateral [place] Bilateral June 2016   20th-- left leg //  1st-- right leg w/ skin graft from right thigh (no lymph node bx)   NEPHROLITHOTOMY Right 05/17/2014   Procedure: NEPHROLITHOTOMY PERCUTANEOUS;  Surgeon: Bernestine Amass, MD;  Location: [facility];  Service: Urology;  Laterality: Right;   PILONIDAL CYST EXCISION     ROBOTIC ASSITED PARTIAL NEPHRECTOMY Left 06/18/2013   Procedure: ROBOTIC ASSISTED PARTIAL NEPHRECTOMY;  Surgeon: Dutch Gray, MD;  Location: [facility];  Service: Urology;  Laterality: Left;  ROTATOR CUFF REPAIR Left 07/2016   SHOULDER ARTHROSCOPY WITH ROTATOR CUFF REPAIR AND SUBACROMIAL DECOMPRESSION Right 05/27/2020   Procedure: RIGHT SHOULDER ARTHROSCOPY WITH EXTENSIVE DEBRIDEMENT, DISTAL CLAVICLE EXCISION AND SUBACROMIAL DECOMPRESSION;  Surgeon: Leandrew Koyanagi, MD;  Location: [place];  Service: Orthopedics;  Laterality: Right;   TEE WITH CARDIOVERSION  12/27/2020   TEE WITHOUT CARDIOVERSION N/A 12/27/2020   Procedure: TRANSESOPHAGEAL ECHOCARDIOGRAM (TEE);  Surgeon: Skeet Latch, MD;  Location: [facility];  Service: Cardiovascular;  Laterality: N/A;   TONSILLECTOMY  84  (age 41)   TOTAL KNEE ARTHROPLASTY Left 02/07/2017   Procedure: LEFT TOTAL KNEE ARTHROPLASTY;  Surgeon: Leandrew Koyanagi, MD;  Location: [place];  Service: Orthopedics;  Laterality: Left;   TOTAL KNEE ARTHROPLASTY Right 04/25/2017   Procedure: RIGHT TOTAL KNEE ARTHROPLASTY;  Surgeon: Leandrew Koyanagi, MD;  Location: [place];  Service: Orthopedics;  Laterality: Right;   TRANSURETHRAL RESECTION OF PROSTATE N/A  01/04/2017   Procedure: TRANSURETHRAL RESECTION OF THE PROSTATE (TURP);  Surgeon: Ardis Hughs, MD;  Location[facility];  Service: Urology;  Laterality: N/A;   Social History   Socioeconomic History   Marital status: Married    Spouse name: Not on file   Number of children: 2   Years of education: 16   Highest education level: Not on file  Occupational History   Occupation: Government social research officer, Psychologist, occupational  Tobacco Use   Smoking status: Former    Packs/day: 1.00    Years: 6.00    Total pack years: 6.00    Types: Cigarettes    Quit date: 10/31/1969    Years since quitting: 52.7   Smokeless tobacco: Never  Vaping Use   Vaping Use: Never used  Substance and Sexual Activity   Alcohol use: Yes    Alcohol/week: 0.0 standard drinks of alcohol    Comment: OCCASIONAL   Drug use: No   Sexual activity: Never  Other Topics Concern   Not on file  Social History Narrative   Regular exercise-no   Social Determinants of Health   Financial Resource Strain: Not on file  Food Insecurity: Not on file  Transportation Needs: Not on file  Physical Activity: Not on file  Stress: Not on file  Social Connections: Not on file  Intimate Partner Violence: Not on file   Current Outpatient Medications on File Prior to Visit  Medication Sig Dispense Refill   dapagliflozin propanediol (FARXIGA) 10 MG TABS tablet Take 1 tablet (10 mg total) by mouth daily before breakfast. 90 tablet 3   Insulin Glargine (BASAGLAR KWIKPEN) 100 UNIT/ML Inject 80 Units into the skin every morning. And pen needles 1/day (Patient taking differently: Inject 90 Units into the skin in the morning. And pen needles 1/day) 90 mL 3   Insulin Pen Needle (PEN NEEDLES) 30G X 8 MM MISC 1 each by Does not apply route daily. E11.9 90 each 0   Lancets (ONETOUCH DELICA PLUS QQIWLN98X) MISC 1 each by Other route 2 (two) times daily. E11.9 200 each 3   metoprolol succinate (TOPROL-XL) 50 MG 24 hr tablet TAKE 1 TABLET DAILY, TAKE  WITH OR IMMEDIATELY FOLLOWING A MEAL 90 tablet 3   nitroGLYCERIN (NITROSTAT) 0.4 MG SL tablet Place 0.4 mg under the tongue every 5 (five) minutes x 3 doses as needed for chest pain.     ONETOUCH ULTRA test strip USE 1 STRIP TWICE A DAY 200 strip 3   rivaroxaban (XARELTO) 20 MG TABS tablet Take 1 tablet (20 mg  total) by mouth daily with supper. 90 tablet 3   rosuvastatin (CRESTOR) 20 MG tablet TAKE 1 TABLET DAILY 90 tablet 3   sacubitril-valsartan (ENTRESTO) 49-51 MG Take 1 tablet by mouth 2 (two) times daily. 180 tablet 3   spironolactone (ALDACTONE) 25 MG tablet TAKE ONE-HALF (1/2) TABLET DAILY 45 tablet 3   trolamine salicylate (ASPERCREME) 10 % cream Apply 1 application  topically 3 (three) times daily as needed for muscle pain.     No current facility-administered medications on file prior to visit.   Allergies  Allergen Reactions   Actos [Pioglitazone] Swelling and Cough    SWELLING REACTION UNSPECIFIED  EDEMA   Family History  Problem Relation Age of Onset   Arthritis Father    Hypertension Father    Diabetes Father    Arthritis Mother    Hypertension Mother    Heart attack Brother 11   Diabetes Brother    Lymphoma Paternal Grandfather    Cancer Maternal Uncle    Cancer Paternal Uncle        type unknown   PE: BP (!) 138/90 (BP Location: Left Arm, Patient Position: Sitting, Cuff Size: Normal)   Pulse 73   Ht '5\' 11"'$  (1.803 m)   Wt 229 lb 12.8 oz (104.2 kg)   SpO2 97%   BMI 32.05 kg/m  Wt Readings from Last 3 Encounters:  07/06/22 229 lb 12.8 oz (104.2 kg)  02/20/22 231 lb (104.8 kg)  01/31/22 233 lb (105.7 kg)   Constitutional: overweight, in NAD Eyes: no exophthalmos ENT: moist mucous membranes, no thyromegaly, no cervical lymphadenopathy Cardiovascular: RRR, No RG, + 1/6 SEM Respiratory: CTA B Musculoskeletal: no deformities Skin: moist, warm, + rashes - stasis dermatitis on B legs Neurological: no tremor with outstretched hands  ASSESSMENT: 1. DM2,  insulin-dependent, uncontrolled, with complications - CAd - NICMP - mild CKD  No family history of medullary thyroid cancer or personal history of pancreatitis.  2. HL  PLAN:  1. Patient with long-standing, uncontrolled diabetes, on oral antidiabetic regimen with SGLT2 inhibitor and also high-dose basal insulin, with still poor control.  However, last HbA1c obtained last month was better, at 7.4%.  He has been trying hard to improve this to be able to have his knee surgery. -At today's visit, sugars are very variable in the morning, fluctuating between 90s and upper 200s.  He mentions that he was recently in vacation and had dietary indiscretions and forgot to take his medicines few days and this is the reason why the sugars are higher.  Indeed, the majority of his blood sugars are fluctuating within the target range in the morning or slightly above.  We discussed about the importance of checking some sugars later in the day, also, to detect trends. -However, I also suggested a GLP-1 receptor agonist.  We can try a low-dose of Rybelsus first and increase it as tolerated.  I explained that this may also help with his cardiovascular condition and also help him with his appetite and reducing weight.  He agrees to try this.  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  I would like to try to decrease Basaglar if possible at next visit. - I suggested to:  Patient Instructions  Please continue: - Farxiga 10 mg in am - Basaglar 90 units at bedtime  Please add: - Rybelsus 3 mg before b'fast x 2 weeks, then increase to 6 mg daily. Let me know if we can increase the dose after this.   Please  return in 3-4 months with your sugar log.   - check sugars at different times of the day - check 1x a day, rotating checks - discussed about CBG targets for treatment: 80-130 mg/dL before meals and <180 mg/dL after meals; target HbA1c <7%. - given foot care handout  - given instructions for hypoglycemia  management "15-15 rule"  - advised for yearly eye exams  - Return to clinic in 3-4 mo with sugar log   2. HL - Reviewed latest lipid panel from 11/2020: LDL above our target of less than 55 due to history of cardiovascular disease, triglycerides slightly elevated: Lab Results  Component Value Date   CHOL 149 12/19/2020   HDL 42 12/19/2020   LDLCALC 79 12/19/2020   TRIG 165 (H) 12/19/2020   CHOLHDL 3.5 12/19/2020  - Continues Crestor 20 without side effects.  - Total time spent for the visit: 40 min, in precharting, reviewing Dr. Cordelia Pen last note, obtaining medical information from the chart and from the pt, reviewing his  previous labs, evaluations, and treatments, reviewing his symptoms, counseling him about his diabetes (please see the discussed topics above), and developing a plan to further treat it; he had a number of questions which I addressed   Philemon Kingdom, MD PhD [facility] Endocrinology

## 2022-07-06 NOTE — Patient Instructions (Addendum)
Please continue: - Farxiga 10 mg in am - Basaglar 90 units at bedtime  Please add: - Rybelsus 3 mg before b'fast x 2 weeks, then increase to 6 mg daily. Let me know if we can increase the dose after this.   Please return in 3-4 months with your sugar log.   PATIENT INSTRUCTIONS FOR TYPE 2 DIABETES:  DIET AND EXERCISE Diet and exercise is an important part of diabetic treatment.  We recommended aerobic exercise in the form of brisk walking (working between 40-60% of maximal aerobic capacity, similar to brisk walking) for 150 minutes per week (such as 30 minutes five days per week) along with 3 times per week performing 'resistance' training (using various gauge rubber tubes with handles) 5-10 exercises involving the major muscle groups (upper body, lower body and core) performing 10-15 repetitions (or near fatigue) each exercise. Start at half the above goal but build slowly to reach the above goals. If limited by weight, joint pain, or disability, we recommend daily walking in a swimming pool with water up to waist to reduce pressure from joints while allow for adequate exercise.    BLOOD GLUCOSES Monitoring your blood glucoses is important for continued management of your diabetes. Please check your blood glucoses 2-4 times a day: fasting, before meals and at bedtime (you can rotate these measurements - e.g. one day check before the 3 meals, the next day check before 2 of the meals and before bedtime, etc.).   HYPOGLYCEMIA (low blood sugar) Hypoglycemia is usually a reaction to not eating, exercising, or taking too much insulin/ other diabetes drugs.  Symptoms include tremors, sweating, hunger, confusion, headache, etc. Treat IMMEDIATELY with 15 grams of Carbs: 4 glucose tablets  cup regular juice/soda 2 tablespoons raisins 4 teaspoons sugar 1 tablespoon honey Recheck blood glucose in 15 mins and repeat above if still symptomatic/blood glucose <100.  RECOMMENDATIONS TO REDUCE YOUR RISK  OF DIABETIC COMPLICATIONS: * Take your prescribed MEDICATION(S) * Follow a DIABETIC diet: Complex carbs, fiber rich foods, (monounsaturated and polyunsaturated) fats * AVOID saturated/trans fats, high fat foods, >2,300 mg salt per day. * EXERCISE at least 5 times a week for 30 minutes or preferably daily.  * DO NOT SMOKE OR DRINK more than 1 drink a day. * Check your FEET every day. Do not wear tightfitting shoes. Contact us if you develop an ulcer * See your EYE doctor once a year or more if needed * Get a FLU shot once a year * Get a PNEUMONIA vaccine once before and once after age 72 years  GOALS:  * Your Hemoglobin A1c of <7%  * fasting sugars need to be <130 * after meals sugars need to be <180 (2h after you start eating) * Your Systolic BP should be 222 or lower  * Your Diastolic BP should be 80 or lower  * Your HDL (Good Cholesterol) should be 40 or higher  * Your LDL (Bad Cholesterol) should be 100 or lower. * Your Triglycerides should be 150 or lower  * Your Urine microalbumin (kidney function) should be <30 * Your Body Mass Index should be 25 or lower    Please consider the following ways to cut down carbs and fat and increase fiber and micronutrients in your diet: - substitute whole grain for white bread or pasta - substitute brown rice for white rice - substitute 90-calorie flat bread pieces for slices of bread when possible - substitute sweet potatoes or yams for white potatoes - substitute  humus for margarine - substitute tofu for cheese when possible - substitute almond or rice milk for regular milk (would not drink soy milk daily due to concern for soy estrogen influence on breast cancer risk) - substitute dark chocolate for other sweets when possible - substitute water - can add lemon or orange slices for taste - for diet sodas (artificial sweeteners will trick your body that you can eat sweets without getting calories and will lead you to overeating and weight gain  in the long run) - do not skip breakfast or other meals (this will slow down the metabolism and will result in more weight gain over time)  - can try smoothies made from fruit and almond/rice milk in am instead of regular breakfast - can also try old-fashioned (not instant) oatmeal made with almond/rice milk in am - order the dressing on the side when eating salad at a restaurant (pour less than half of the dressing on the salad) - eat as little meat as possible - can try juicing, but should not forget that juicing will get rid of the fiber, so would alternate with eating raw veg./fruits or drinking smoothies - use as little oil as possible, even when using olive oil - can dress a salad with a mix of balsamic vinegar and lemon juice, for e.g. - use agave nectar, stevia sugar, or regular sugar rather than artificial sweateners - steam or broil/roast veggies  - snack on veggies/fruit/nuts (unsalted, preferably) when possible, rather than processed foods - reduce or eliminate aspartame in diet (it is in diet sodas, chewing gum, etc) Read the labels!  Try to read Dr. Janene Harvey book: "Program for Reversing Diabetes" for other ideas for healthy eating.

## 2022-07-09 NOTE — Pre-Procedure Instructions (Signed)
Surgical Instructions    Your procedure is scheduled on Friday, August 25.  Report to [facility] Main Entrance "A" at 1015 A.M., then check in with the Admitting office.  Call this number if you have problems the morning of surgery:  [phone]   If you have any questions prior to your surgery date call [phone]: Open Monday-Friday 8am-4pm    Remember:  Do not eat after midnight the night before your surgery  You may drink clear liquids until 9:15 the morning of your surgery.   Clear liquids allowed are: Water, Non-Citrus Juices (without pulp), Carbonated Beverages, Clear Tea, Black Coffee ONLY (NO MILK, CREAM OR POWDERED CREAMER of any kind), and Gatorade    Take these medicines the morning of surgery with A SIP OF WATER:  metoprolol succinate (TOPROL-XL) rosuvastatin (CRESTOR)  If needed: nitroGLYCERIN (NITROSTAT) Please let your nurse know if you take this the day of surgery.  Follow your surgeon's instructions on when to stop rivaroxaban (XARELTO).  If no instructions were given by your surgeon then you will need to call the office to get those instructions.    As of today, STOP taking any Aspirin (unless otherwise instructed by your surgeon) Aleve, Naproxen, Ibuprofen, Motrin, Advil, Goody's, BC's, all herbal medications, fish oil, and all vitamins.  WHAT DO I DO ABOUT MY DIABETES MEDICATION?   Do not take Semaglutide (RYBELSUS) the morning of surgery.  Hold dapagliflozin propanediol (FARXIGA) for 72 hours prior to surgery. Last dose should be Monday August 21.  THE NIGHT BEFORE SURGERY, take 45 units of glargine insulin.       THE MORNING OF SURGERY, take 45 units of glargine insulin.    HOW TO MANAGE YOUR DIABETES BEFORE AND AFTER SURGERY  Why is it important to control my blood sugar before and after surgery? Improving blood sugar levels before and after surgery helps healing and can limit problems. A way of improving blood sugar control is eating a  healthy diet by:  Eating less sugar and carbohydrates  Increasing activity/exercise  Talking with your doctor about reaching your blood sugar goals High blood sugars (greater than 180 mg/dL) can raise your risk of infections and slow your recovery, so you will need to focus on controlling your diabetes during the weeks before surgery. Make sure that the doctor who takes care of your diabetes knows about your planned surgery including the date and location.  How do I manage my blood sugar before surgery? Check your blood sugar at least 4 times a day, starting 2 days before surgery, to make sure that the level is not too high or low.  Check your blood sugar the morning of your surgery when you wake up and every 2 hours until you get to the Short Stay unit.  If your blood sugar is less than 70 mg/dL, you will need to treat for low blood sugar: Do not take insulin. Treat a low blood sugar (less than 70 mg/dL) with  cup of clear juice (cranberry or apple), 4 glucose tablets, OR glucose gel. Recheck blood sugar in 15 minutes after treatment (to make sure it is greater than 70 mg/dL). If your blood sugar is not greater than 70 mg/dL on recheck, call [phone] for further instructions. Report your blood sugar to the short stay nurse when you get to Short Stay.  If you are admitted to the hospital after surgery: Your blood sugar will be checked by the staff and you will probably be given insulin after surgery (  instead of oral diabetes medicines) to make sure you have good blood sugar levels. The goal for blood sugar control after surgery is 80-180 mg/dL.               [facility] is not responsible for any belongings or valuables.    Do NOT Smoke (Tobacco/Vaping)  24 hours prior to your procedure  If you use a CPAP at night, you may bring your mask for your overnight stay.   Contacts, glasses, hearing aids, dentures or partials may not be worn into surgery, please bring cases for these  belongings   For patients admitted to the hospital, discharge time will be determined by your treatment team.   Patients discharged the day of surgery will not be allowed to drive home, and someone needs to stay with them for 24 hours.   SURGICAL WAITING ROOM VISITATION Patients having surgery or a procedure may have no more than 2 support people in the waiting area - these visitors may rotate.   Children under the age of 23 must have an adult with them who is not the patient. If the patient needs to stay at the hospital during part of their recovery, the visitor guidelines for inpatient rooms apply. Pre-op nurse will coordinate an appropriate time for 1 support person to accompany patient in pre-op.  This support person may not rotate.   Please refer to the [facility] website for the visitor guidelines for Inpatients (after your surgery is over and you are in a regular room).    Special instructions:    Oral Hygiene is also important to reduce your risk of infection.  Remember - BRUSH YOUR TEETH THE MORNING OF SURGERY WITH YOUR REGULAR TOOTHPASTE   [place]- Preparing For Surgery  Before surgery, you can play an important role. Because skin is not sterile, your skin needs to be as free of germs as possible. You can reduce the number of germs on your skin by washing with CHG (chlorahexidine gluconate) Soap before surgery.  CHG is an antiseptic cleaner which kills germs and bonds with the skin to continue killing germs even after washing.     Please do not use if you have an allergy to CHG or antibacterial soaps. If your skin becomes reddened/irritated stop using the CHG.  Do not shave (including legs and underarms) for at least 48 hours prior to first CHG shower. It is OK to shave your face.  Please follow these instructions carefully.     Shower the NIGHT BEFORE SURGERY and the MORNING OF SURGERY with CHG Soap.   If you chose to wash your hair, wash your hair first as usual with  your normal shampoo. After you shampoo, rinse your hair and body thoroughly to remove the shampoo.  Then [employer] and genitals (private parts) with your normal soap and rinse thoroughly to remove soap.  After that Use CHG Soap as you would any other liquid soap. You can apply CHG directly to the skin and wash gently with a scrungie or a clean washcloth.   Apply the CHG Soap to your body ONLY FROM THE NECK DOWN.  Do not use on open wounds or open sores. Avoid contact with your eyes, ears, mouth and genitals (private parts). Wash Face and genitals (private parts)  with your normal soap.   Wash thoroughly, paying special attention to the area where your surgery will be performed.  Thoroughly rinse your body with warm water from the neck down.  DO  NOT shower/wash with your normal soap after using and rinsing off the CHG Soap.  Pat yourself dry with a CLEAN TOWEL.  Wear CLEAN PAJAMAS to bed the night before surgery  Place CLEAN SHEETS on your bed the night before your surgery  DO NOT SLEEP WITH PETS.   Day of Surgery:  Take a shower with CHG soap. Do not wear jewelry. Do not wear lotions, powders, cologne or deodorant. Do not shave 48 hours prior to surgery.  Men may shave face and neck. Do not bring valuables to the hospital. Wear Clean/Comfortable clothing the morning of surgery Do not apply any deodorants/lotions.   Remember to brush your teeth WITH YOUR REGULAR TOOTHPASTE.    If you received a COVID test during your pre-op visit, it is requested that you wear a mask when out in public, stay away from anyone that may not be feeling well, and notify your surgeon if you develop symptoms. If you have been in contact with anyone that has tested positive in the last 10 days, please notify your surgeon.    Please read over the following fact sheets that you were given.

## 2022-07-10 ENCOUNTER — Encounter (HOSPITAL_COMMUNITY): Payer: Self-pay

## 2022-07-10 ENCOUNTER — Encounter (HOSPITAL_COMMUNITY)
Admission: RE | Admit: 2022-07-10 | Discharge: 2022-07-10 | Disposition: A | Discharge status: Home / Self Care | Payer: BC Managed Care – PPO | Source: Ambulatory Visit | Attending: Orthopaedic Surgery | Admitting: Orthopaedic Surgery

## 2022-07-10 ENCOUNTER — Other Ambulatory Visit: Payer: Self-pay

## 2022-07-10 VITALS — BP 181/72 | HR 63 | Temp 98.2°F | Resp 18 | Ht 71.0 in | Wt 235.1 lb

## 2022-07-10 DIAGNOSIS — I4892 Unspecified atrial flutter: Secondary | ICD-10-CM | POA: Insufficient documentation

## 2022-07-10 DIAGNOSIS — I110 Hypertensive heart disease with heart failure: Secondary | ICD-10-CM | POA: Insufficient documentation

## 2022-07-10 DIAGNOSIS — Z794 Long term (current) use of insulin: Secondary | ICD-10-CM | POA: Insufficient documentation

## 2022-07-10 DIAGNOSIS — T84033A Mechanical loosening of internal left knee prosthetic joint, initial encounter: Secondary | ICD-10-CM | POA: Insufficient documentation

## 2022-07-10 DIAGNOSIS — E119 Type 2 diabetes mellitus without complications: Secondary | ICD-10-CM | POA: Insufficient documentation

## 2022-07-10 DIAGNOSIS — R972 Elevated prostate specific antigen [PSA]: Secondary | ICD-10-CM | POA: Diagnosis not present

## 2022-07-10 DIAGNOSIS — I2510 Atherosclerotic heart disease of native coronary artery without angina pectoris: Secondary | ICD-10-CM | POA: Insufficient documentation

## 2022-07-10 DIAGNOSIS — Z01818 Encounter for other preprocedural examination: Secondary | ICD-10-CM

## 2022-07-10 DIAGNOSIS — Z85820 Personal history of malignant melanoma of skin: Secondary | ICD-10-CM | POA: Insufficient documentation

## 2022-07-10 DIAGNOSIS — I4510 Unspecified right bundle-branch block: Secondary | ICD-10-CM | POA: Insufficient documentation

## 2022-07-10 HISTORY — DX: Thoracic aortic ectasia: I77.810

## 2022-07-10 LAB — COMPREHENSIVE METABOLIC PANEL
ALT: 33 U/L (ref 0–44)
AST: 29 U/L (ref 15–41)
Albumin: 3.6 g/dL (ref 3.5–5.0)
Alkaline Phosphatase: 59 U/L (ref 38–126)
Anion gap: 8 (ref 5–15)
BUN: 18 mg/dL (ref 8–23)
CO2: 23 mmol/L (ref 22–32)
Calcium: 8.8 mg/dL — ABNORMAL LOW (ref 8.9–10.3)
Chloride: 111 mmol/L (ref 98–111)
Creatinine, Ser: 0.97 mg/dL (ref 0.61–1.24)
GFR, Estimated: >60 mL/min (ref >60)
Glucose, Bld: 137 mg/dL — ABNORMAL HIGH (ref 70–99)
Potassium: 4.1 mmol/L (ref 3.5–5.1)
Sodium: 142 mmol/L (ref 135–145)
Total Bilirubin: 0.7 mg/dL (ref 0.3–1.2)
Total Protein: 5.7 g/dL — ABNORMAL LOW (ref 6.5–8.1)

## 2022-07-10 LAB — GLUCOSE, CAPILLARY: Glucose-Capillary: 153 mg/dL — ABNORMAL HIGH (ref 70–99)

## 2022-07-10 LAB — SURGICAL PCR SCREEN
MRSA, PCR: NEGATIVE
Staphylococcus aureus: NEGATIVE

## 2022-07-10 LAB — TYPE AND SCREEN
ABO/RH(D): A POS
Antibody Screen: NEGATIVE

## 2022-07-10 LAB — CBC
HCT: 45.6 % (ref 39.0–52.0)
Hemoglobin: 14.9 g/dL (ref 13.0–17.0)
MCH: 28.2 pg (ref 26.0–34.0)
MCHC: 32.7 g/dL (ref 30.0–36.0)
MCV: 86.2 fL (ref 80.0–100.0)
Platelets: 158 10*3/uL (ref 150–400)
RBC: 5.29 MIL/uL (ref 4.22–5.81)
RDW: 13.5 % (ref 11.5–15.5)
WBC: 4.6 10*3/uL (ref 4.0–10.5)
nRBC: 0 % (ref 0.0–0.2)

## 2022-07-10 NOTE — Progress Notes (Signed)
PCP - Daiva Eves, MD Cardiologist - Candee Furbish, MD  PPM/ICD - n/a  Chest x-ray - 02/07/22 EKG - 01/31/22 Stress Test - 2017 ECHO - 12/22/21 Cardiac Cath -2012   Sleep Study - denies CPAP - denies  Fasting Blood Sugar - 130-160 Checks Blood Sugar 1 time a day. CBG at PAT153. Last A1C 7.4 on 06/13/22  Blood Thinner Instructions: Xarelto; LD 07/07/22 Aspirin Instructions: n/a  ERAS Protcol -Clear liquids until 0915 DOS PRE-SURGERY Ensure or G2- G2 provided  COVID TEST- n/a  Anesthesia review: Yes, cardiac hx. Cardiac clearance received 06/26/22.  Patient denies shortness of breath, fever, cough and chest pain at PAT appointment   All instructions explained to the patient, with a verbal understanding of the material. Patient agrees to go over the instructions while at home for a better understanding. Patient also instructed to self quarantine after being tested for COVID-19. The opportunity to ask questions was provided.

## 2022-07-10 NOTE — Discharge Instructions (Addendum)
INSTRUCTIONS AFTER JOINT REPLACEMENT   Remove items at home which could result in a fall. This includes throw rugs or furniture in walking pathways ICE to the affected joint every three hours while awake for 30 minutes at a time, for at least the first 3-5 days, and then as needed for pain and swelling.  Continue to use ice for pain and swelling. You may notice swelling that will progress down to the foot and ankle.  This is normal after surgery.  Elevate your leg when you are not up walking on it.   Continue to use the breathing machine you got in the hospital (incentive spirometer) which will help keep your temperature down.  It is common for your temperature to cycle up and down following surgery, especially at night when you are not up moving around and exerting yourself.  The breathing machine keeps your lungs expanded and your temperature down.   DIET:  As you were doing prior to hospitalization, we recommend a well-balanced diet.  DRESSING / WOUND CARE / SHOWERING  Keep the surgical dressing until follow up.  The dressing is water proof, so you can shower without any extra covering.  IF THE DRESSING FALLS OFF or the wound gets wet inside, change the dressing with sterile gauze.  Please use good hand washing techniques before changing the dressing.  Do not use any lotions or creams on the incision until instructed by your surgeon.    ACTIVITY  Increase activity slowly as tolerated, but follow the weight bearing instructions below.   No driving for 6 weeks or until further direction given by your physician.  You cannot drive while taking narcotics.  No lifting or carrying greater than 10 lbs. until further directed by your surgeon. Avoid periods of inactivity such as sitting longer than an hour when not asleep. This helps prevent blood clots.  You may return to work once you are authorized by your doctor.     WEIGHT BEARING   Weight bearing as tolerated with assist device (walker, cane,  etc) as directed, use it as long as suggested by your surgeon or therapist, typically at least 4-6 weeks.   EXERCISES  Results after joint replacement surgery are often greatly improved when you follow the exercise, range of motion and muscle strengthening exercises prescribed by your doctor. Safety measures are also important to protect the joint from further injury. Any time any of these exercises cause you to have increased pain or swelling, decrease what you are doing until you are comfortable again and then slowly increase them. If you have problems or questions, call your caregiver or physical therapist for advice.   Rehabilitation is important following a joint replacement. After just a few days of immobilization, the muscles of the leg can become weakened and shrink (atrophy).  These exercises are designed to build up the tone and strength of the thigh and leg muscles and to improve motion. Often times heat used for twenty to thirty minutes before working out will loosen up your tissues and help with improving the range of motion but do not use heat for the first two weeks following surgery (sometimes heat can increase post-operative swelling).   These exercises can be done on a training (exercise) mat, on the floor, on a table or on a bed. Use whatever works the best and is most comfortable for you.    Use music or television while you are exercising so that the exercises are a pleasant break in your   day. This will make your life better with the exercises acting as a break in your routine that you can look forward to.   Perform all exercises about fifteen times, three times per day or as directed.  You should exercise both the operative leg and the other leg as well.  Exercises include:   Quad Sets - Tighten up the muscle on the front of the thigh (Quad) and hold for 5-10 seconds.   Straight Leg Raises - With your knee straight (if you were given a brace, keep it on), lift the leg to 60  degrees, hold for 3 seconds, and slowly lower the leg.  Perform this exercise against resistance later as your leg gets stronger.  Leg Slides: Lying on your back, slowly slide your foot toward your buttocks, bending your knee up off the floor (only go as far as is comfortable). Then slowly slide your foot back down until your leg is flat on the floor again.  Angel Wings: Lying on your back spread your legs to the side as far apart as you can without causing discomfort.  Hamstring Strength:  Lying on your back, push your heel against the floor with your leg straight by tightening up the muscles of your buttocks.  Repeat, but this time bend your knee to a comfortable angle, and push your heel against the floor.  You may put a pillow under the heel to make it more comfortable if necessary.   A rehabilitation program following joint replacement surgery can speed recovery and prevent re-injury in the future due to weakened muscles. Contact your doctor or a physical therapist for more information on knee rehabilitation.    CONSTIPATION  Constipation is defined medically as fewer than three stools per week and severe constipation as less than one stool per week.  Even if you have a regular bowel pattern at home, your normal regimen is likely to be disrupted due to multiple reasons following surgery.  Combination of anesthesia, postoperative narcotics, change in appetite and fluid intake all can affect your bowels.   YOU MUST use at least one of the following options; they are listed in order of increasing strength to get the job done.  They are all available over the counter, and you may need to use some, POSSIBLY even all of these options:    Drink plenty of fluids (prune juice may be helpful) and high fiber foods Colace 100 mg by mouth twice a day  Senokot for constipation as directed and as needed Dulcolax (bisacodyl), take with full glass of water  Miralax (polyethylene glycol) once or twice a day as  needed.  If you have tried all these things and are unable to have a bowel movement in the first 3-4 days after surgery call either your surgeon or your primary doctor.    If you experience loose stools or diarrhea, hold the medications until you stool forms back up.  If your symptoms do not get better within 1 week or if they get worse, check with your doctor.  If you experience "the worst abdominal pain ever" or develop nausea or vomiting, please contact the office immediately for further recommendations for treatment.   ITCHING:  If you experience itching with your medications, try taking only a single pain pill, or even half a pain pill at a time.  You can also use Benadryl over the counter for itching or also to help with sleep.   TED HOSE STOCKINGS:  Use stockings on both   legs until for at least 2 weeks or as directed by physician office. They may be removed at night for sleeping.  MEDICATIONS:  See your medication summary on the "After Visit Summary" that nursing will review with you.  You may have some home medications which will be placed on hold until you complete the course of blood thinner medication.  It is important for you to complete the blood thinner medication as prescribed.  PRECAUTIONS:  If you experience chest pain or shortness of breath - call 911 immediately for transfer to the hospital emergency department.   If you develop a fever greater that 101 F, purulent drainage from wound, increased redness or drainage from wound, foul odor from the wound/dressing, or calf pain - CONTACT YOUR SURGEON.                                                   FOLLOW-UP APPOINTMENTS:  If you do not already have a post-op appointment, please call the office for an appointment to be seen by your surgeon.  Guidelines for how soon to be seen are listed in your "After Visit Summary", but are typically between 1-4 weeks after surgery.  OTHER INSTRUCTIONS:   Knee Replacement:  Do not place pillow  under knee, focus on keeping the knee straight while resting. CPM instructions: 0-90 degrees, 2 hours in the morning, 2 hours in the afternoon, and 2 hours in the evening. Place foam block, curve side up under heel at all times except when in CPM or when walking.  DO NOT modify, tear, cut, or change the foam block in any way.  POST-OPERATIVE OPIOID TAPER INSTRUCTIONS: It is important to wean off of your opioid medication as soon as possible. If you do not need pain medication after your surgery it is ok to stop day one. Opioids include: Codeine, Hydrocodone(Norco, Vicodin), Oxycodone(Percocet, oxycontin) and hydromorphone amongst others.  Long term and even short term use of opiods can cause: Increased pain response Dependence Constipation Depression Respiratory depression And more.  Withdrawal symptoms can include Flu like symptoms Nausea, vomiting And more Techniques to manage these symptoms Hydrate well Eat regular healthy meals Stay active Use relaxation techniques(deep breathing, meditating, yoga) Do Not substitute Alcohol to help with tapering If you have been on opioids for less than two weeks and do not have pain than it is ok to stop all together.  Plan to wean off of opioids This plan should start within one week post op of your joint replacement. Maintain the same interval or time between taking each dose and first decrease the dose.  Cut the total daily intake of opioids by one tablet each day Next start to increase the time between doses. The last dose that should be eliminated is the evening dose.   MAKE SURE YOU:  Understand these instructions.  Get help right away if you are not doing well or get worse.    Thank you for letting us be a part of your medical care team.  It is a privilege we respect greatly.  We hope these instructions will help you stay on track for a fast and full recovery!    ===================================================================  Information on my medicine - XARELTO (rivaroxaban)  This medication education was reviewed with me or my healthcare representative as part of my discharge preparation.  The pharmacist  that spoke with me during my hospital stay was:  Donnamae Jude, [place]? Xarelto was prescribed to treat blood clots that may have been found in the veins of your legs (deep vein thrombosis) or in your lungs (pulmonary embolism) and to reduce the risk of them occurring again.  What do you need to know about Xarelto? The starting dose is one 15 mg tablet taken TWICE daily with food for the FIRST 21 DAYS then on 08/06/22  the dose is changed to one 20 mg tablet taken ONCE A DAY with your evening meal.  DO NOT stop taking Xarelto without talking to the health care provider who prescribed the medication.  Refill your prescription for 20 mg tablets before you run out.  After discharge, you should have regular check-up appointments with your healthcare provider that is prescribing your Xarelto.  In the future your dose may need to be changed if your kidney function changes by a significant amount.  What do you do if you miss a dose? If you are taking Xarelto TWICE DAILY and you miss a dose, take it as soon as you remember. You may take two 15 mg tablets (total 30 mg) at the same time then resume your regularly scheduled 15 mg twice daily the next day.  If you are taking Xarelto ONCE DAILY and you miss a dose, take it as soon as you remember on the same day then continue your regularly scheduled once daily regimen the next day. Do not take two doses of Xarelto at the same time.   Important Safety Information Xarelto is a blood thinner medicine that can cause bleeding. You should call your healthcare provider right away if you experience any of the following: Bleeding from an injury or your nose that does not  stop. Unusual colored urine (red or dark brown) or unusual colored stools (red or black). Unusual bruising for unknown reasons. A serious fall or if you hit your head (even if there is no bleeding).  Some medicines may interact with Xarelto and might increase your risk of bleeding while on Xarelto. To help avoid this, consult your healthcare provider or pharmacist prior to using any new prescription or non-prescription medications, including herbals, vitamins, non-steroidal anti-inflammatory drugs (NSAIDs) and supplements.  This website has more information on Xarelto: [email or web address] ==============================================================  Deep Vein Thrombosis    Deep vein thrombosis (DVT) is a condition in which a blood clot forms in a deep vein, such as a lower leg, thigh, or arm vein. A clot is blood that has thickened into a gel or solid. This condition is dangerous. It can lead to serious and even life-threatening complications if the clot travels to the lungs and causes a blockage (pulmonary embolism). It can also damage veins in the leg. This can result in leg pain, swelling, discoloration, and sores (post-thrombotic syndrome).  What are the causes? This condition may be caused by: A slowdown of blood flow. Damage to a vein. A condition that causes blood to clot more easily, such as an inherited clotting disorder.  What increases the risk? The following factors may make you more likely to develop this condition: Being overweight. Being older, especially over age 51. Sitting or lying down for more than four hours. Being in the hospital. Lack of physical activity (sedentary lifestyle). Pregnancy, being in childbirth, or having recently given birth. Taking medicines that contain estrogen, such as medicines to prevent pregnancy. Smoking. A history of any of  the following: Blood clots or a blood clotting disease. Peripheral vascular disease. Inflammatory bowel  disease. Cancer. Heart disease. Genetic conditions that affect how your blood clots, such as Factor V Leiden mutation. Neurological diseases that affect your legs (leg paresis). A recent injury, such as a car accident. Major or lengthy surgery. A central line placed inside a large vein.  What are the signs or symptoms? Symptoms of this condition include: Swelling, pain, or tenderness in an arm or leg. Warmth, redness, or discoloration in an arm or leg. If the clot is in your leg, symptoms may be more noticeable or worse when you stand or walk. Some people may not develop any symptoms.  How is this diagnosed? This condition is diagnosed with: A medical history and physical exam. Tests, such as: Blood tests. These are done to check how well your blood clots. Ultrasound. This is done to check for clots. Venogram. For this test, contrast dye is injected into a vein and X-rays are taken to check for any clots  How is this treated? Treatment for this condition depends on: The cause of your DVT. Your risk for bleeding or developing more clots. Any other medical conditions that you have. Treatment may include: Taking a blood thinner (anticoagulant). This type of medicine prevents clots from forming. It may be taken by mouth, injected under the skin, or injected through an IV (catheter). Injecting clot-dissolving medicines into the affected vein (catheter-directed thrombolysis). Having surgery. Surgery may be done to: Remove the clot. Place a filter in a large vein to catch blood clots before they reach the lungs. Some treatments may be continued for up to six months.  Follow these instructions at home: If you are taking blood thinners: Take the medicine exactly as told by your health care provider. Some blood thinners need to be taken at the same time every day. Do not skip a dose. Talk with your health care provider before you take any medicines that contain aspirin or NSAIDs. These  medicines increase your risk for dangerous bleeding. Ask your health care provider about foods and drugs that could change the way the medicine works (may interact). Avoid those things if your health care provider tells you to do so. Blood thinners can cause easy bruising and may make it difficult to stop bleeding. Because of this: Be very careful when using knives, scissors, or other sharp objects. Use an electric razor instead of a blade. Avoid activities that could cause injury or bruising, and follow instructions about how to prevent falls. Wear a medical alert bracelet or carry a card that lists what medicines you take.  General instructions Take over-the-counter and prescription medicines only as told by your health care provider. Return to your normal activities as told by your health care provider. Ask your health care provider what activities are safe for you. Wear compression stockings if recommended by your health care provider. Keep all follow-up visits as told by your health care provider. This is important.  How is this prevented? To lower your risk of developing this condition again: For 30 or more minutes every day, do an activity that: Involves moving your arms and legs. Increases your heart rate. When traveling for longer than four hours: Exercise your arms and legs every hour. Drink plenty of water. Avoid drinking alcohol. Avoid sitting or lying for a long time without moving your legs. If you have surgery or you are hospitalized, ask about ways to prevent blood clots. These may include taking frequent  walks or using anticoagulants. Stay at a healthy weight. If you are a woman who is older than age 56, avoid unnecessary use of medicines that contain estrogen, such as some birth control pills. Do not use any products that contain nicotine or tobacco, such as cigarettes and e-cigarettes. This is especially important if you take estrogen medicines. If you need help  quitting, ask your health care provider.  Contact a health care provider if: You miss a dose of your blood thinner. Your menstrual period is heavier than usual. You have unusual bruising.  Get help right away if: You have: New or increased pain, swelling, or redness in an arm or leg. Numbness or tingling in an arm or leg. Shortness of breath. Chest pain. A rapid or irregular heartbeat. A severe headache or confusion. A cut that will not stop bleeding. There is blood in your vomit, stool, or urine. You have a serious fall or accident, or you hit your head. You feel light-headed or dizzy. You cough up blood.  These symptoms may represent a serious problem that is an emergency. Do not wait to see if the symptoms will go away. Get medical help right away. Call your local emergency services (911 in the [place] Do not drive yourself to the hospital. Summary Deep vein thrombosis (DVT) is a condition in which a blood clot forms in a deep vein, such as a lower leg, thigh, or arm vein. Symptoms can include swelling, warmth, pain, and redness in your leg or arm. This condition may be treated with a blood thinner (anticoagulant medicine), medicine that is injected to dissolve blood clots,compression stockings, or surgery. If you are prescribed blood thinners, take them exactly as told. This information is not intended to replace advice given to you by your health care provider. Make sure you discuss any questions you have with your health care provider. Document Revised: 10/18/2017 Document Reviewed: 04/05/2017 [employer] Patient Education  [employer].

## 2022-07-11 ENCOUNTER — Encounter (HOSPITAL_COMMUNITY): Payer: Self-pay

## 2022-07-11 NOTE — Progress Notes (Signed)
Anesthesia Chart Review:  Case: [id number] Date/Time: 07/13/22 1200   Procedure: LEFT TOTAL KNEE REVISION, TIBIAL COMPONENT (Left: Knee)   Anesthesia type: Spinal   Pre-op diagnosis: left total knee aseptic loosening   Location: [facility] OR ROOM 02 / [place] [facility]   Surgeons: Leandrew Koyanagi, MD       DISCUSSION: Patient is a 71 year old male scheduled for the above procedure.  History includes former smoker (6 pack years, quit 10/31/69), HTN, RBBB, CAD (possible 60% LAD 07/2011), aflutter (DCCV 04/02/13, recurrent 11/2020 s/p DCCV 12/27/20, AF ablation 10/27/21), non-ischemic cardiomyopathy, chronic combined systolic and diastolic CHF, murmur (moderate AS/AR 12/22/21 echo), HLD, DM2, anxiety, BPH (s/p TURP 01/04/17), left renal oncocytoma (s/p left robotic assisted laparoscopic partial nephrectomy 06/18/13), osteoarthritis (left TKA 02/07/17; right TKA 04/25/17), skin cancer (melanoma excision RLE 2016), nephrolithiasis. BMI is consistent with obesity.   Preoperative cardiology telephonic evaluation on 06/26/22 by Christen Bame, NP, "Preoperative Cardiovascular Risk Assessment: The patient is doing well from a cardiac perspective. Therefore, based on ACC/AHA guidelines, the patient would be at acceptable risk for the planned procedure without further cardiovascular testing. The patient was advised that if he develops new symptoms prior to surgery to contact our office to arrange for a follow-up visit, and he verbalized understanding. According to the Revised Cardiac Risk Index (RCRI), his Perioperative Risk of Major Cardiac Event is (%): 6.6. His Functional Capacity in METs is: 7.59 according to the Duke Activity Status Index (DASI). Per office protocol, patient can hold Xarelto for 3 days prior to procedure."  A1c 7.4% on 06/13/22. On Farxiga 10 mg daily, insulin glargine 90 units Q AM, and Rybelsus 3 mg daily added 07/06/22 (has not started yet because waiting on pharmacy to get). Per PAT instructions, he was instructed to  hold [place] for 72 hours prior to surgery. He will continue glargine insulin 90 units Q AM but only 45 units on the morning of surgery. Since he has not gotten Rybelsus yet, he will not plan to start until after surgery (I notified him that this drug should be held for 24 hours prior to surgery.)  Reported last Xarelto 07/07/22.   Anesthesia team to evaluate on the day of surgery. Case is posted for spinal anesthesia. He does have mild-moderate AS with mean gradient 19. mmHg, AV Vmax 3.04 m/s, AVA (VTI) 1.89 cm by 12/2021 echo. Anesthesiologist to determine the definitive anesthesia plan following evaluation. Defer decision for inra-operatively a-line to anesthesiologist.    VS: BP (!) 181/72   Pulse 63   Temp 36.8 C (Oral)   Resp 18   Ht '5\' 11"'$  (1.803 m)   Wt 106.6 kg   SpO2 98%   BMI 32.79 kg/m  BP Readings from Last 3 Encounters:  07/10/22 (!) 181/72  07/06/22 (!) 138/90  02/20/22 (!) 166/80      PROVIDERS: Leonides Sake, MD is PCP  Candee Furbish, MD is cardiologist. Last office visit 02/20/22. EF normalized following aflutter ablation--likely tachycardia mediated cardiomyopathy. Maintaining SR. On Xarelto for CVA prophylaxis. Repeat echo in one year planned to follow-up mild-moderate AS and dilated aortic root. At that time, he felt Mr. Newstrom was "low overall cardiac risk" for knee surgery. Six month follow-up planned. Lars Mage, MD is EP cardiologist. Last visit 01/31/22. Follow-up in one year planned. Philemon Kingdom, MD is endocrinologist Louis Meckel, MD is urologist   LABS: Labs reviewed: Acceptable for surgery. A1c 7.4% on 06/13/22.  (all labs ordered are listed, but only abnormal results are displayed)  Labs Reviewed  GLUCOSE, CAPILLARY - Abnormal; Notable for the following components:      Result Value   Glucose-Capillary 153 (*)    All other components within normal limits  COMPREHENSIVE METABOLIC PANEL - Abnormal; Notable for the following  components:   Glucose, Bld 137 (*)    Calcium 8.8 (*)    Total Protein 5.7 (*)    All other components within normal limits  SURGICAL PCR SCREEN  CBC  TYPE AND SCREEN    Sleep Study 12/18/13: IMPRESSION/ RECOMMENDATION:   1) Mild obstructive sleep apnea/hypopneas syndrome, AHI 9.5 per hour. Non-positional events. Loud snoring with oxygen desaturation to a nadir of 85% and mean oxygen saturation through the study of 93.6% on room air.  2) The study was ordered as a diagnostic polysomnogram without CPAP. If appropriate, this patient can schedule with the sleep disorders Center for a dedicated CPAP titration study.   IMAGES: CXR 02/07/22: FINDINGS: Lung volumes are normal. No consolidative airspace disease. No pleural effusions. No pneumothorax. No pulmonary nodule or mass noted. Pulmonary vasculature and the cardiomediastinal silhouette are within normal limits. Atherosclerosis in the thoracic aorta. IMPRESSION: 1. No radiographic evidence of acute cardiopulmonary disease. 2. Aortic atherosclerosis.   EKG: 01/31/22: Normal sinus rhythm Right bundle branch block Left axis deviation PR 194 ms   CV: Echo 12/22/21: IMPRESSIONS   1. Compared with the echo 03/3663, systolic function has improved.   2. Left ventricular ejection fraction, by estimation, is 50 to 55%. The  left ventricle has low normal function. The left ventricle demonstrates  regional wall motion abnormalities (see scoring diagram/findings for  description). The left ventricular  internal cavity size was moderately dilated. There is moderate concentric  left ventricular hypertrophy. Left ventricular diastolic parameters are  consistent with Grade I diastolic dysfunction (impaired relaxation). The  average left ventricular global  longitudinal strain is 21.9 %. The global longitudinal strain is normal.   3. Right ventricular systolic function is normal. The right ventricular  size is normal. There is normal pulmonary  artery systolic pressure.   4. Left atrial size was severely dilated.   5. The mitral valve is normal in structure. Trivial mitral valve  regurgitation. No evidence of mitral stenosis.   6. The aortic valve is normal in structure. There is moderate  calcification of the aortic valve. There is moderate thickening of the  aortic valve. Aortic valve regurgitation is mild to moderate. Mild to  moderate aortic valve stenosis. Aortic  regurgitation PHT measures 600 msec. Aortic valve mean gradient measures  19.0 mmHg. Aortic valve Vmax measures 3.04 m/s. AV Area (VTI):     1.89 cm.  7. There is moderate dilatation of the ascending aorta, measuring 46 mm. Aortic root measures 44 mm. There is moderate dilatation of the aortic root, measuring 44 mm.  - Comparison 05/10/21: LVEF: 30-35%, moderate LVH, mildly reduced RVSF, mildly dilated LA, mild-moderate MR,  mild-moderate AR, mild AS (MG 11 mmHg, Vmax 2.3 m/s) but moderate by AVA 1.0 cm^2 and DI  0.44. Low SV index (21 cc/m2), suspect low flow low gradient moderate AS, dilated aortic root 43 mm, ascending aorta 43 mm.    EP Procedure 10/27/21: CONCLUSIONS:   1. Isthmus-dependent counter clockwise right atrial flutter.   2. Successful radiofrequency ablation of atrial flutter along the cavotricuspid isthmus with complete bidirectional isthmus block achieved.   3. No inducible arrhythmias following ablation.   4.  Electrophysiology study with infusion of Isopril revealed a prolonged HV interval of  75 ms, wide QRS 175 ms.  5. No early apparent complications.     CT Cardiac 10/20/21: IMPRESSION: 1. There is normal pulmonary vein drainage into the left atrium. 2. The left atrial appendage is large chicken wing type with two lobes and ostial size 31 x 18 mm and length 43 mm. There is no thrombus in the left atrial appendage. 3. The esophagus runs in the left atrial midline adjacent to the left lower pulmonary vein. 4. Coronary calcium score 1092.  This was 85th percentile for age and gender. 5. Ascending aorta mildly dilated.  4.1 cm.   Long term ZioXT monitor 09/22/21-09/28/21: HR 49 - 131bpm, average 85 bpm. 100% atrial flutter. No atrial fibrillation detected. Rare ventricular ectopy.    Cardiac cath 08/17/11: - Mitral valve is normal.   - The left ventricle appears dilated.  There appears to be global hypokinesis noted with an estimated ejection fraction of 40-45%. - Coronary arteries rise and distribute normally.  There is very minimal coronary calcification noted.   - Left main coronary artery is normal.  Coronaries are large.   - Left anterior descending has a large diagonal branch which bifurcates into 3 branches. It contains scattered irregularities.  Just after the bifurcation of the diagonal branch, appears to be an eccentric moderate LAD stenosis of 60%; however, there is streaming around this vessel despite high blood flow, so it is hard to tell the severity of this lesion does not appear critical, however.   - Circumflex coronary artery is a codominant vessel supplying a twin posterior descending artery.  A large first marginal branch arises.  There is no significant atherosclerotic disease involving the circumflex.   - The right coronary artery ends in a posterior descending artery.  It is a large vessel and contains only scattered irregularities. IMPRESSION: 1. Possible moderate left anterior descending stenosis just after the     bifurcation of a large diagonal branch. 2. Abnormal left ventricular function with an ejection fraction of 40-     45% mild global hypokinesis.    Past Medical History:  Diagnosis Date   Anxiety    Arthritis    everywhere   Arthrosis of right acromioclavicular joint 01/29/2020   Atrial flutter [facility])    Onset May 2014 Cardioversion done    BPH associated with nocturia    BPH with obstruction/lower urinary tract symptoms 01/04/2017   CAD (coronary artery disease) 04/02/2013   Cath  2012 moderate mid LAD lesion, otherwise not much disease    Diabetes ([place]) 01/18/2018   Dilated aortic root (HCC)    aortic root 44 mm, ascending aorta 46 mm 12/22/21 echo   Encounter for long-term (current) use of other medications 07/27/2013   Essential hypertension, benign    First degree heart block    Heart murmur    mild-moderate AS and AR 01/11/22   History of bilateral knee replacement 02/07/2017   History of bladder stone    History of kidney stones    History of melanoma excision    June 2016  --- s/p  excision right  leg (per pt localized and no recurrence)   Hypertensive heart disease 04/02/2013   Impingement syndrome of right shoulder 01/29/2020   Impotence of organic origin    Left-sided low back pain with left-sided sciatica 01/23/2019   Long term current use of anticoagulant therapy    Mixed hyperlipidemia    slightly   Multiple renal cysts    bilateral   Nephrolithiasis 05/17/2014  Nonischemic cardiomyopathy (HCC)    Osteoarthritis    PAF (paroxysmal atrial fibrillation) ([place])    followed by cardiology  (takes ASA)   RBBB    RBBB (right bundle branch block with left anterior fascicular block) 04/02/2013   Renal oncocytoma of left kidney 06/18/2013   s/p left robotic assited laparoscopic partial nephrectomy 06/18/13   Right ureteral stone    Rotator cuff syndrome of left shoulder 04/17/2016   Status post total left knee replacement 01/01/2020   Tendinopathy of right biceps tendon 01/29/2020   Tendinopathy of right rotator cuff 01/29/2020   Type 2 diabetes mellitus treated with insulin [facility])    endocrinologist-  dr Loanne Drilling   Wears glasses     Past Surgical History:  Procedure Laterality Date   A-FLUTTER ABLATION N/A 10/27/2021   Procedure: A-FLUTTER ABLATION;  Surgeon: Vickie Epley, MD;  Location: [place] [facility];  Service: Cardiovascular;  Laterality: N/A;   CARDIAC CATHETERIZATION  08-17-2011  DR Frederico Hamman [facility]   POSSIBLE MODERATE LAD STENOSIS JUST  AFTER THE BIFURCATION OF LARGE DIAGONAL BRANCH/ LVEF  40-45%/ MILD GLOBAL HYPODINESIS (CATH DONE FOR ABNORMAL STRESS TEST OF FIXED LATERAL WALL DEFECT & BIFASCICULAR BLAOCK)   CARDIOVERSION N/A 04/02/2013   Procedure: CARDIOVERSION;  Surgeon: Jacolyn Reedy, MD;  Location: [place];  Service: Cardiovascular;  Laterality: N/A;   CARDIOVERSION N/A 12/27/2020   Procedure: CARDIOVERSION;  Surgeon: Skeet Latch, MD;  Location: [place];  Service: Cardiovascular;  Laterality: N/A;   CYSTOSCOPY WITH LITHOLAPAXY N/A 05/18/2015   Procedure: CYSTOSCOPY WITH LITHOLAPAXY;  Surgeon: Rana Snare, MD;  Location: [facility];  Service: Urology;  Laterality: N/A;   CYSTOSCOPY WITH LITHOLAPAXY N/A 01/04/2017   Procedure: CYSTOSCOPY WITH LITHOLAPAXY, Holmium Laser;  Surgeon: Ardis Hughs, MD;  Location: [facility];  Service: Urology;  Laterality: N/A;   CYSTOSCOPY WITH RETROGRADE PYELOGRAM, URETEROSCOPY AND STENT PLACEMENT  11/03/2012   Procedure: CYSTOSCOPY WITH RETROGRADE PYELOGRAM, URETEROSCOPY AND STENT PLACEMENT;  Surgeon: Bernestine Amass, MD;  Location: [facility];  Service: Urology;  Laterality: Left;  Cystoscopy/Left Retrograde/Ureteroscopy/Holmium Laser Litho/Double J Stent   CYSTOSCOPY WITH URETEROSCOPY, STONE BASKETRY AND STENT PLACEMENT Right 05/15/2019   Procedure: CYSTOSCOPY WITH URETEROSCOPY, LASER LITHOTRIPSY STONE BASKETRY AND STENT PLACEMENT, RIGHT RETROGRADE;  Surgeon: Ardis Hughs, MD;  Location: [facility];  Service: Urology;  Laterality: Right;   HOLMIUM LASER APPLICATION  40/34/7425   Procedure: HOLMIUM LASER APPLICATION;  Surgeon: Bernestine Amass, MD;  Location: [facility];  Service: Urology;  Laterality: Left;   HOLMIUM LASER APPLICATION N/A 9/56/3875   Procedure: HOLMIUM LASER APPLICATION;  Surgeon: Rana Snare, MD;  Location: [facility];  Service: Urology;  Laterality: N/A;   KNEE SURGERY Bilateral [place] Bilateral June 2016   20th-- left leg //  1st-- right leg w/ skin graft from right thigh (no lymph node bx)   NEPHROLITHOTOMY Right 05/17/2014   Procedure: NEPHROLITHOTOMY PERCUTANEOUS;  Surgeon: Bernestine Amass, MD;  Location: [facility];  Service: Urology;  Laterality: Right;   PILONIDAL CYST EXCISION     ROBOTIC ASSITED PARTIAL NEPHRECTOMY Left 06/18/2013   Procedure: ROBOTIC ASSISTED PARTIAL NEPHRECTOMY;  Surgeon: Dutch Gray, MD;  Location: [facility];  Service: Urology;  Laterality: Left;   ROTATOR CUFF REPAIR Left 07/2016   SHOULDER ARTHROSCOPY WITH ROTATOR CUFF REPAIR AND SUBACROMIAL DECOMPRESSION Right 05/27/2020   Procedure: RIGHT SHOULDER ARTHROSCOPY WITH EXTENSIVE DEBRIDEMENT, DISTAL  CLAVICLE EXCISION AND SUBACROMIAL DECOMPRESSION;  Surgeon: Leandrew Koyanagi, MD;  Location: [place];  Service: Orthopedics;  Laterality: Right;   TEE WITH CARDIOVERSION  12/27/2020   TEE WITHOUT CARDIOVERSION N/A 12/27/2020   Procedure: TRANSESOPHAGEAL ECHOCARDIOGRAM (TEE);  Surgeon: Skeet Latch, MD;  Location: [facility];  Service: Cardiovascular;  Laterality: N/A;   TONSILLECTOMY  77  (age 79)   TOTAL KNEE ARTHROPLASTY Left 02/07/2017   Procedure: LEFT TOTAL KNEE ARTHROPLASTY;  Surgeon: Leandrew Koyanagi, MD;  Location: [place];  Service: Orthopedics;  Laterality: Left;   TOTAL KNEE ARTHROPLASTY Right 04/25/2017   Procedure: RIGHT TOTAL KNEE ARTHROPLASTY;  Surgeon: Leandrew Koyanagi, MD;  Location: [place];  Service: Orthopedics;  Laterality: Right;   TRANSURETHRAL RESECTION OF PROSTATE N/A 01/04/2017   Procedure: TRANSURETHRAL RESECTION OF THE PROSTATE (TURP);  Surgeon: Ardis Hughs, MD;  Location[facility];  Service: Urology;  Laterality: N/A;    MEDICATIONS:  dapagliflozin propanediol (FARXIGA) 10 MG TABS tablet   Insulin Glargine (BASAGLAR KWIKPEN) 100 UNIT/ML   Insulin Pen Needle (PEN NEEDLES) 30G X 8 MM MISC   Lancets (ONETOUCH DELICA PLUS [id number]) MISC    metoprolol succinate (TOPROL-XL) 50 MG 24 hr tablet   nitroGLYCERIN (NITROSTAT) 0.4 MG SL tablet   ONETOUCH ULTRA test strip   rivaroxaban (XARELTO) 20 MG TABS tablet   rosuvastatin (CRESTOR) 20 MG tablet   sacubitril-valsartan (ENTRESTO) 49-51 MG   Semaglutide (RYBELSUS) 3 MG TABS   spironolactone (ALDACTONE) 25 MG tablet   trolamine salicylate (ASPERCREME) 10 % cream   No current facility-administered medications for this encounter.    Myra Gianotti, PA-C Surgical Short Stay/Anesthesiology [facility] Phone [phone] [facility] Phone [phone] 07/11/2022 2:51 PM

## 2022-07-11 NOTE — Anesthesia Preprocedure Evaluation (Addendum)
Anesthesia Evaluation  Patient identified by MRN, date of birth, ID band Patient awake    Reviewed: Allergy & Precautions, NPO status , Patient's Chart, lab work & pertinent test results, reviewed documented beta blocker date and time   History of Anesthesia Complications Negative for: history of anesthetic complications  Airway Mallampati: II  TM Distance: >3 FB Neck ROM: Full    Dental  (+) Dental Advisory Given   Pulmonary sleep apnea (does not use CPAP) , former smoker,    breath sounds clear to auscultation       Cardiovascular hypertension, Pt. on home beta blockers and Pt. on medications (-) angina+ CAD (single vessel LAD) and +CHF (Entresto)  + dysrhythmias Atrial Fibrillation + Valvular Problems/Murmurs (mod AS, mild-mod MR) AS  Rhythm:Regular Rate:Normal + Systolic murmurs Echo 05/27/38: IMPRESSIONS  1. Compared with the echo 0/3009, systolic function has improved.  2. Left ventricular ejection fraction, by estimation, is 50 to 55%. The  left ventricle has low normal function. The left ventricle demonstrates  regional wall motion abnormalities (see scoring diagram/findings for  description). The left ventricular  internal cavity size was moderately dilated. There is moderate concentric  left ventricular hypertrophy. Left ventricular diastolic parameters are  consistent with Grade I diastolic dysfunction (impaired relaxation). The  average left ventricular global  longitudinal strain is 21.9 %. The global longitudinal strain is normal.  3. Right ventricular systolic function is normal. The right ventricular  size is normal. There is normal pulmonary artery systolic pressure.  4. Left atrial size was severely dilated.  5. The mitral valve is normal in structure. Trivial mitral valve  regurgitation. No evidence of mitral stenosis.  6. The aortic valve is normal in structure. There is moderate  calcification of the  aortic valve. There is moderate thickening of the  aortic valve. Aortic valve regurgitation is mild to moderate. Mild to  moderate aortic valve stenosis. Aortic  regurgitation PHT measures 600 msec. Aortic valve mean gradient measures  19.0 mmHg. Aortic valve Vmax measures 3.04 m/s. AV Area (VTI):   1.89 cm. 7. There is moderate dilatation of the ascending aorta, measuring 46 mm. Aortic root measures 44 mm. There is moderate dilatation of the aortic root, measuring 44 mm.    Neuro/Psych Anxiety negative neurological ROS     GI/Hepatic negative GI ROS, Neg liver ROS,   Endo/Other  diabetes (glu 143), Insulin DependentMorbid obesity  Renal/GU Renal InsufficiencyRenal disease     Musculoskeletal  (+) Arthritis , Osteoarthritis,    Abdominal (+) + obese,   Peds  Hematology Xarelto: last dose Monday   Anesthesia Other Findings   Reproductive/Obstetrics                           Anesthesia Physical Anesthesia Plan  ASA: 3  Anesthesia Plan: General   Post-op Pain Management: Regional block* and Tylenol PO (pre-op)*   Induction: Intravenous  PONV Risk Score and Plan: 2 and Ondansetron and Dexamethasone  Airway Management Planned: Oral ETT  Additional Equipment: ClearSight  Intra-op Plan:   Post-operative Plan: Extubation in OR  Informed Consent: I have reviewed the patients History and Physical, chart, labs and discussed the procedure including the risks, benefits and alternatives for the proposed anesthesia with the patient or authorized representative who has indicated his/her understanding and acceptance.     Dental advisory given  Plan Discussed with: CRNA and Surgeon  Anesthesia Plan Comments: (PAT note written 07/11/2022 by Myra Gianotti, PA-C.  Mild to moderate AS. Plan routine monitors, ClearSight BP monitoring, GA with adductor canal block for post op analgesia )      Anesthesia Quick Evaluation

## 2022-07-12 MED ORDER — TRANEXAMIC ACID 1000 MG/10ML IV SOLN
2000.0000 mg | INTRAVENOUS | Status: DC
  Filled 2022-07-12: qty 20

## 2022-07-13 ENCOUNTER — Other Ambulatory Visit: Payer: Self-pay

## 2022-07-13 ENCOUNTER — Inpatient Hospital Stay (HOSPITAL_COMMUNITY): Payer: BC Managed Care – PPO | Admitting: Anesthesiology

## 2022-07-13 ENCOUNTER — Inpatient Hospital Stay (HOSPITAL_COMMUNITY): Payer: BC Managed Care – PPO

## 2022-07-13 ENCOUNTER — Encounter (HOSPITAL_COMMUNITY)
Admission: RE | Disposition: A | Discharge status: Home / Self Care | Payer: Self-pay | Source: Home / Self Care | Attending: Orthopaedic Surgery

## 2022-07-13 ENCOUNTER — Encounter (HOSPITAL_COMMUNITY): Payer: Self-pay | Admitting: Orthopaedic Surgery

## 2022-07-13 ENCOUNTER — Inpatient Hospital Stay (HOSPITAL_COMMUNITY): Payer: BC Managed Care – PPO | Admitting: Vascular Surgery

## 2022-07-13 ENCOUNTER — Inpatient Hospital Stay (HOSPITAL_COMMUNITY)
Admission: RE | Admit: 2022-07-13 | Discharge: 2022-07-17 | DRG: 467 | Disposition: A | Discharge status: Home / Self Care | Payer: BC Managed Care – PPO | Attending: Orthopaedic Surgery | Admitting: Orthopaedic Surgery

## 2022-07-13 DIAGNOSIS — Z96642 Presence of left artificial hip joint: Secondary | ICD-10-CM | POA: Diagnosis not present

## 2022-07-13 DIAGNOSIS — Z8261 Family history of arthritis: Secondary | ICD-10-CM

## 2022-07-13 DIAGNOSIS — E119 Type 2 diabetes mellitus without complications: Secondary | ICD-10-CM | POA: Diagnosis present

## 2022-07-13 DIAGNOSIS — Z6832 Body mass index (BMI) 32.0-32.9, adult: Secondary | ICD-10-CM | POA: Diagnosis not present

## 2022-07-13 DIAGNOSIS — Z85820 Personal history of malignant melanoma of skin: Secondary | ICD-10-CM | POA: Diagnosis not present

## 2022-07-13 DIAGNOSIS — N401 Enlarged prostate with lower urinary tract symptoms: Secondary | ICD-10-CM | POA: Diagnosis present

## 2022-07-13 DIAGNOSIS — E782 Mixed hyperlipidemia: Secondary | ICD-10-CM | POA: Diagnosis present

## 2022-07-13 DIAGNOSIS — I82452 Acute embolism and thrombosis of left peroneal vein: Secondary | ICD-10-CM | POA: Diagnosis not present

## 2022-07-13 DIAGNOSIS — Z96651 Presence of right artificial knee joint: Secondary | ICD-10-CM | POA: Diagnosis present

## 2022-07-13 DIAGNOSIS — Z96652 Presence of left artificial knee joint: Secondary | ICD-10-CM | POA: Diagnosis not present

## 2022-07-13 DIAGNOSIS — Z9889 Other specified postprocedural states: Secondary | ICD-10-CM | POA: Diagnosis not present

## 2022-07-13 DIAGNOSIS — Z87891 Personal history of nicotine dependence: Secondary | ICD-10-CM

## 2022-07-13 DIAGNOSIS — Z807 Family history of other malignant neoplasms of lymphoid, hematopoietic and related tissues: Secondary | ICD-10-CM | POA: Diagnosis not present

## 2022-07-13 DIAGNOSIS — G8918 Other acute postprocedural pain: Secondary | ICD-10-CM | POA: Diagnosis not present

## 2022-07-13 DIAGNOSIS — Z888 Allergy status to other drugs, medicaments and biological substances status: Secondary | ICD-10-CM

## 2022-07-13 DIAGNOSIS — I82442 Acute embolism and thrombosis of left tibial vein: Secondary | ICD-10-CM | POA: Diagnosis not present

## 2022-07-13 DIAGNOSIS — M7989 Other specified soft tissue disorders: Secondary | ICD-10-CM | POA: Diagnosis not present

## 2022-07-13 DIAGNOSIS — I5042 Chronic combined systolic (congestive) and diastolic (congestive) heart failure: Secondary | ICD-10-CM | POA: Diagnosis present

## 2022-07-13 DIAGNOSIS — I4892 Unspecified atrial flutter: Secondary | ICD-10-CM | POA: Diagnosis present

## 2022-07-13 DIAGNOSIS — I480 Paroxysmal atrial fibrillation: Secondary | ICD-10-CM | POA: Diagnosis present

## 2022-07-13 DIAGNOSIS — Z794 Long term (current) use of insulin: Secondary | ICD-10-CM

## 2022-07-13 DIAGNOSIS — T84033A Mechanical loosening of internal left knee prosthetic joint, initial encounter: Principal | ICD-10-CM

## 2022-07-13 DIAGNOSIS — Z833 Family history of diabetes mellitus: Secondary | ICD-10-CM

## 2022-07-13 DIAGNOSIS — I2510 Atherosclerotic heart disease of native coronary artery without angina pectoris: Secondary | ICD-10-CM | POA: Diagnosis present

## 2022-07-13 DIAGNOSIS — I440 Atrioventricular block, first degree: Secondary | ICD-10-CM | POA: Diagnosis present

## 2022-07-13 DIAGNOSIS — Z79899 Other long term (current) drug therapy: Secondary | ICD-10-CM

## 2022-07-13 DIAGNOSIS — Z8249 Family history of ischemic heart disease and other diseases of the circulatory system: Secondary | ICD-10-CM

## 2022-07-13 DIAGNOSIS — I4510 Unspecified right bundle-branch block: Secondary | ICD-10-CM | POA: Diagnosis present

## 2022-07-13 DIAGNOSIS — Z01818 Encounter for other preprocedural examination: Secondary | ICD-10-CM

## 2022-07-13 DIAGNOSIS — M1990 Unspecified osteoarthritis, unspecified site: Secondary | ICD-10-CM | POA: Diagnosis present

## 2022-07-13 DIAGNOSIS — Z905 Acquired absence of kidney: Secondary | ICD-10-CM | POA: Diagnosis not present

## 2022-07-13 DIAGNOSIS — I509 Heart failure, unspecified: Secondary | ICD-10-CM | POA: Diagnosis not present

## 2022-07-13 DIAGNOSIS — M25562 Pain in left knee: Secondary | ICD-10-CM | POA: Diagnosis not present

## 2022-07-13 DIAGNOSIS — Z7901 Long term (current) use of anticoagulants: Secondary | ICD-10-CM

## 2022-07-13 DIAGNOSIS — M62831 Muscle spasm of calf: Secondary | ICD-10-CM | POA: Diagnosis not present

## 2022-07-13 DIAGNOSIS — Z471 Aftercare following joint replacement surgery: Secondary | ICD-10-CM | POA: Diagnosis not present

## 2022-07-13 DIAGNOSIS — Y792 Prosthetic and other implants, materials and accessory orthopedic devices associated with adverse incidents: Secondary | ICD-10-CM | POA: Diagnosis present

## 2022-07-13 DIAGNOSIS — I428 Other cardiomyopathies: Secondary | ICD-10-CM | POA: Diagnosis present

## 2022-07-13 DIAGNOSIS — E669 Obesity, unspecified: Secondary | ICD-10-CM | POA: Diagnosis present

## 2022-07-13 DIAGNOSIS — R011 Cardiac murmur, unspecified: Secondary | ICD-10-CM | POA: Diagnosis present

## 2022-07-13 DIAGNOSIS — F419 Anxiety disorder, unspecified: Secondary | ICD-10-CM | POA: Diagnosis present

## 2022-07-13 DIAGNOSIS — I110 Hypertensive heart disease with heart failure: Secondary | ICD-10-CM | POA: Diagnosis present

## 2022-07-13 HISTORY — PX: TOTAL KNEE REVISION: SHX996

## 2022-07-13 LAB — GLUCOSE, CAPILLARY
Glucose-Capillary: 143 mg/dL — ABNORMAL HIGH (ref 70–99)
Glucose-Capillary: 100 mg/dL — ABNORMAL HIGH (ref 70–99)
Glucose-Capillary: 116 mg/dL — ABNORMAL HIGH (ref 70–99)
Glucose-Capillary: 212 mg/dL — ABNORMAL HIGH (ref 70–99)

## 2022-07-13 SURGERY — TOTAL KNEE REVISION
Anesthesia: General | Site: Knee | Laterality: Left

## 2022-07-13 MED ORDER — OXYCODONE HCL 5 MG PO TABS
10.0000 mg | ORAL_TABLET | ORAL | Status: DC | PRN
  Administered 2022-07-15: 15 mg via ORAL
  Administered 2022-07-15: 10 mg via ORAL
  Administered 2022-07-16: 15 mg via ORAL
  Filled 2022-07-13 (×2): qty 3
  Filled 2022-07-13: qty 2

## 2022-07-13 MED ORDER — MIDAZOLAM HCL 2 MG/2ML IJ SOLN
INTRAMUSCULAR | Status: AC
  Administered 2022-07-13: 2 mg via INTRAVENOUS
  Filled 2022-07-13: qty 2

## 2022-07-13 MED ORDER — TRANEXAMIC ACID 1000 MG/10ML IV SOLN
2000.0000 mg | Freq: Once | INTRAVENOUS | Status: AC
  Administered 2022-07-13: 2000 mg via TOPICAL
  Filled 2022-07-13: qty 20

## 2022-07-13 MED ORDER — PHENOL 1.4 % MT LIQD: 1.0000 | OROMUCOSAL | Status: DC | PRN

## 2022-07-13 MED ORDER — ORAL CARE MOUTH RINSE
15.0000 mL | Freq: Once | OROMUCOSAL | Status: AC
Start: 2022-07-13 — End: 2022-07-13

## 2022-07-13 MED ORDER — ACETAMINOPHEN 500 MG PO TABS
1000.0000 mg | ORAL_TABLET | Freq: Once | ORAL | Status: AC
  Administered 2022-07-13: 1000 mg via ORAL

## 2022-07-13 MED ORDER — TRANEXAMIC ACID-NACL 1000-0.7 MG/100ML-% IV SOLN
1000.0000 mg | Freq: Once | INTRAVENOUS | Status: AC
  Administered 2022-07-13: 1000 mg via INTRAVENOUS
  Filled 2022-07-13: qty 100

## 2022-07-13 MED ORDER — ONDANSETRON HCL 4 MG PO TABS: 4.0000 mg | ORAL_TABLET | Freq: Four times a day (QID) | ORAL | Status: DC | PRN

## 2022-07-13 MED ORDER — ACETAMINOPHEN 500 MG PO TABS
1000.0000 mg | ORAL_TABLET | Freq: Four times a day (QID) | ORAL | Status: AC
  Administered 2022-07-13 – 2022-07-14 (×4): 1000 mg via ORAL
  Filled 2022-07-13 (×4): qty 2

## 2022-07-13 MED ORDER — DAPAGLIFLOZIN PROPANEDIOL 10 MG PO TABS
10.0000 mg | ORAL_TABLET | Freq: Every day | ORAL | Status: DC
  Administered 2022-07-14 – 2022-07-17 (×4): 10 mg via ORAL
  Filled 2022-07-13 (×4): qty 1

## 2022-07-13 MED ORDER — BUPIVACAINE-MELOXICAM ER 400-12 MG/14ML IJ SOLN
INTRAMUSCULAR | Status: AC
Start: 2022-07-13 — End: ?
  Filled 2022-07-13: qty 1

## 2022-07-13 MED ORDER — INSULIN ASPART 100 UNIT/ML IJ SOLN: 0.0000 [IU] | INTRAMUSCULAR | Status: DC | PRN

## 2022-07-13 MED ORDER — MEPERIDINE HCL 25 MG/ML IJ SOLN: 6.2500 mg | INTRAMUSCULAR | Status: DC | PRN

## 2022-07-13 MED ORDER — METHOCARBAMOL 1000 MG/10ML IJ SOLN: 500.0000 mg | Freq: Four times a day (QID) | INTRAVENOUS | Status: DC | PRN

## 2022-07-13 MED ORDER — OXYCODONE HCL 5 MG PO TABS
5.0000 mg | ORAL_TABLET | ORAL | Status: DC | PRN
  Administered 2022-07-13 – 2022-07-16 (×4): 10 mg via ORAL
  Administered 2022-07-17: 5 mg via ORAL
  Filled 2022-07-13: qty 1
  Filled 2022-07-13 (×4): qty 2

## 2022-07-13 MED ORDER — METOCLOPRAMIDE HCL 5 MG PO TABS: 5.0000 mg | ORAL_TABLET | Freq: Three times a day (TID) | ORAL | Status: DC | PRN

## 2022-07-13 MED ORDER — FENTANYL CITRATE (PF) 250 MCG/5ML IJ SOLN
INTRAMUSCULAR | Status: AC
  Filled 2022-07-13: qty 5

## 2022-07-13 MED ORDER — RIVAROXABAN 20 MG PO TABS
20.0000 mg | ORAL_TABLET | Freq: Every day | ORAL | Status: DC
  Administered 2022-07-14: 20 mg via ORAL
  Filled 2022-07-13: qty 1

## 2022-07-13 MED ORDER — LACTATED RINGERS IV SOLN: INTRAVENOUS | Status: DC

## 2022-07-13 MED ORDER — PROMETHAZINE HCL 25 MG/ML IJ SOLN: 6.2500 mg | INTRAMUSCULAR | Status: DC | PRN

## 2022-07-13 MED ORDER — POVIDONE-IODINE 10 % EX SWAB
2.0000 | Freq: Once | CUTANEOUS | Status: AC
  Administered 2022-07-13: 2 via TOPICAL

## 2022-07-13 MED ORDER — TRANEXAMIC ACID-NACL 1000-0.7 MG/100ML-% IV SOLN
1000.0000 mg | INTRAVENOUS | Status: AC
  Administered 2022-07-13: 1000 mg via INTRAVENOUS
  Filled 2022-07-13: qty 100

## 2022-07-13 MED ORDER — CHLORHEXIDINE GLUCONATE 0.12 % MT SOLN
15.0000 mL | Freq: Once | OROMUCOSAL | Status: AC
  Administered 2022-07-13: 15 mL via OROMUCOSAL
  Filled 2022-07-13: qty 15

## 2022-07-13 MED ORDER — CEFAZOLIN SODIUM-DEXTROSE 2-4 GM/100ML-% IV SOLN
2.0000 g | Freq: Four times a day (QID) | INTRAVENOUS | Status: AC
  Administered 2022-07-13 – 2022-07-14 (×2): 2 g via INTRAVENOUS
  Filled 2022-07-13 (×2): qty 100

## 2022-07-13 MED ORDER — SPIRONOLACTONE 12.5 MG HALF TABLET
12.5000 mg | ORAL_TABLET | Freq: Every day | ORAL | Status: DC
  Administered 2022-07-13 – 2022-07-17 (×5): 12.5 mg via ORAL
  Filled 2022-07-13 (×5): qty 1

## 2022-07-13 MED ORDER — OXYCODONE HCL ER 10 MG PO T12A
10.0000 mg | EXTENDED_RELEASE_TABLET | Freq: Two times a day (BID) | ORAL | Status: DC
  Administered 2022-07-13 – 2022-07-16 (×7): 10 mg via ORAL
  Filled 2022-07-13 (×7): qty 1

## 2022-07-13 MED ORDER — SODIUM CHLORIDE 0.9 % IR SOLN
Status: DC | PRN
  Administered 2022-07-13: 1000 mL

## 2022-07-13 MED ORDER — ONDANSETRON HCL 4 MG/2ML IJ SOLN
INTRAMUSCULAR | Status: DC | PRN
  Administered 2022-07-13: 4 mg via INTRAVENOUS

## 2022-07-13 MED ORDER — MENTHOL 3 MG MT LOZG: 1.0000 | LOZENGE | OROMUCOSAL | Status: DC | PRN

## 2022-07-13 MED ORDER — PHENYLEPHRINE 80 MCG/ML (10ML) SYRINGE FOR IV PUSH (FOR BLOOD PRESSURE SUPPORT)
PREFILLED_SYRINGE | INTRAVENOUS | Status: DC | PRN
  Administered 2022-07-13: 80 ug via INTRAVENOUS
  Administered 2022-07-13: 160 ug via INTRAVENOUS
  Administered 2022-07-13 (×4): 80 ug via INTRAVENOUS

## 2022-07-13 MED ORDER — METOPROLOL SUCCINATE ER 50 MG PO TB24
50.0000 mg | ORAL_TABLET | Freq: Every day | ORAL | Status: DC
  Administered 2022-07-14 – 2022-07-17 (×4): 50 mg via ORAL
  Filled 2022-07-13 (×4): qty 1

## 2022-07-13 MED ORDER — ROCURONIUM BROMIDE 10 MG/ML (PF) SYRINGE
PREFILLED_SYRINGE | INTRAVENOUS | Status: DC | PRN
  Administered 2022-07-13: 10 mg via INTRAVENOUS
  Administered 2022-07-13: 70 mg via INTRAVENOUS
  Administered 2022-07-13 (×2): 20 mg via INTRAVENOUS

## 2022-07-13 MED ORDER — HYDROMORPHONE HCL 1 MG/ML IJ SOLN
0.5000 mg | INTRAMUSCULAR | Status: DC | PRN
  Administered 2022-07-14 – 2022-07-15 (×3): 1 mg via INTRAVENOUS
  Filled 2022-07-13 (×3): qty 1

## 2022-07-13 MED ORDER — DEXAMETHASONE SODIUM PHOSPHATE 10 MG/ML IJ SOLN
INTRAMUSCULAR | Status: DC | PRN
  Administered 2022-07-13: 10 mg via INTRAVENOUS

## 2022-07-13 MED ORDER — MIDAZOLAM HCL 2 MG/2ML IJ SOLN: 2.0000 mg | Freq: Once | INTRAMUSCULAR | Status: AC

## 2022-07-13 MED ORDER — HYDROMORPHONE HCL 1 MG/ML IJ SOLN
0.2500 mg | INTRAMUSCULAR | Status: DC | PRN
  Administered 2022-07-13 (×2): 0.5 mg via INTRAVENOUS

## 2022-07-13 MED ORDER — OXYCODONE HCL 5 MG/5ML PO SOLN: 5.0000 mg | Freq: Once | ORAL | Status: DC | PRN

## 2022-07-13 MED ORDER — OXYCODONE HCL 5 MG PO TABS: 5.0000 mg | ORAL_TABLET | Freq: Once | ORAL | Status: DC | PRN

## 2022-07-13 MED ORDER — FENTANYL CITRATE (PF) 250 MCG/5ML IJ SOLN
INTRAMUSCULAR | Status: DC | PRN
  Administered 2022-07-13: 50 ug via INTRAVENOUS
  Administered 2022-07-13: 150 ug via INTRAVENOUS
  Administered 2022-07-13: 50 ug via INTRAVENOUS

## 2022-07-13 MED ORDER — HEMOSTATIC AGENTS (NO CHARGE) OPTIME
TOPICAL | Status: DC | PRN
  Administered 2022-07-13: 1 via TOPICAL

## 2022-07-13 MED ORDER — INSULIN ASPART 100 UNIT/ML IJ SOLN
0.0000 [IU] | Freq: Three times a day (TID) | INTRAMUSCULAR | Status: DC
  Administered 2022-07-14: 3 [IU] via SUBCUTANEOUS
  Administered 2022-07-14: 5 [IU] via SUBCUTANEOUS
  Administered 2022-07-14: 3 [IU] via SUBCUTANEOUS
  Administered 2022-07-15: 2 [IU] via SUBCUTANEOUS
  Administered 2022-07-15: 3 [IU] via SUBCUTANEOUS

## 2022-07-13 MED ORDER — ROPIVACAINE HCL 7.5 MG/ML IJ SOLN
INTRAMUSCULAR | Status: DC | PRN
  Administered 2022-07-13: 20 mL via PERINEURAL

## 2022-07-13 MED ORDER — EPHEDRINE SULFATE-NACL 50-0.9 MG/10ML-% IV SOSY
PREFILLED_SYRINGE | INTRAVENOUS | Status: DC | PRN
  Administered 2022-07-13: 10 mg via INTRAVENOUS

## 2022-07-13 MED ORDER — FERROUS SULFATE 325 (65 FE) MG PO TABS
325.0000 mg | ORAL_TABLET | Freq: Three times a day (TID) | ORAL | Status: DC
  Administered 2022-07-13 – 2022-07-17 (×11): 325 mg via ORAL
  Filled 2022-07-13 (×11): qty 1

## 2022-07-13 MED ORDER — PRONTOSAN WOUND IRRIGATION OPTIME
TOPICAL | Status: DC | PRN
  Administered 2022-07-13 (×2): 1 via TOPICAL

## 2022-07-13 MED ORDER — SACUBITRIL-VALSARTAN 49-51 MG PO TABS
1.0000 | ORAL_TABLET | Freq: Two times a day (BID) | ORAL | Status: DC
  Administered 2022-07-13 – 2022-07-16 (×7): 1 via ORAL
  Filled 2022-07-13 (×8): qty 1

## 2022-07-13 MED ORDER — ONDANSETRON HCL 4 MG/2ML IJ SOLN: 4.0000 mg | Freq: Four times a day (QID) | INTRAMUSCULAR | Status: DC | PRN

## 2022-07-13 MED ORDER — INSULIN GLARGINE 100 UNIT/ML ~~LOC~~ SOLN
90.0000 [IU] | Freq: Every morning | SUBCUTANEOUS | Status: DC
  Administered 2022-07-14 – 2022-07-17 (×4): 90 [IU] via SUBCUTANEOUS
  Filled 2022-07-13 (×4): qty 0.9

## 2022-07-13 MED ORDER — HYDROMORPHONE HCL 1 MG/ML IJ SOLN
INTRAMUSCULAR | Status: AC
  Filled 2022-07-13: qty 1

## 2022-07-13 MED ORDER — VANCOMYCIN HCL 1000 MG IV SOLR
INTRAVENOUS | Status: AC
Start: 2022-07-13 — End: ?
  Filled 2022-07-13: qty 20

## 2022-07-13 MED ORDER — CEFAZOLIN SODIUM-DEXTROSE 2-4 GM/100ML-% IV SOLN
2.0000 g | INTRAVENOUS | Status: AC
  Administered 2022-07-13: 2 g via INTRAVENOUS
  Filled 2022-07-13: qty 100

## 2022-07-13 MED ORDER — 0.9 % SODIUM CHLORIDE (POUR BTL) OPTIME
TOPICAL | Status: DC | PRN
  Administered 2022-07-13: 1000 mL

## 2022-07-13 MED ORDER — VANCOMYCIN HCL 1000 MG IV SOLR
INTRAVENOUS | Status: DC | PRN
  Administered 2022-07-13: 1000 mg

## 2022-07-13 MED ORDER — PROPOFOL 10 MG/ML IV BOLUS
INTRAVENOUS | Status: DC | PRN
  Administered 2022-07-13: 30 mg via INTRAVENOUS
  Administered 2022-07-13: 100 mg via INTRAVENOUS

## 2022-07-13 MED ORDER — DEXAMETHASONE SODIUM PHOSPHATE 10 MG/ML IJ SOLN
10.0000 mg | Freq: Once | INTRAMUSCULAR | Status: AC
  Administered 2022-07-14: 10 mg via INTRAVENOUS
  Filled 2022-07-13: qty 1

## 2022-07-13 MED ORDER — LIDOCAINE 2% (20 MG/ML) 5 ML SYRINGE
INTRAMUSCULAR | Status: DC | PRN
  Administered 2022-07-13: 20 mg via INTRAVENOUS

## 2022-07-13 MED ORDER — PROPOFOL 10 MG/ML IV BOLUS
INTRAVENOUS | Status: AC
  Filled 2022-07-13: qty 20

## 2022-07-13 MED ORDER — BUPIVACAINE-MELOXICAM ER 400-12 MG/14ML IJ SOLN
INTRAMUSCULAR | Status: DC | PRN
  Administered 2022-07-13: 400 mg

## 2022-07-13 MED ORDER — ACETAMINOPHEN 325 MG PO TABS
325.0000 mg | ORAL_TABLET | Freq: Four times a day (QID) | ORAL | Status: DC | PRN
  Administered 2022-07-16: 650 mg via ORAL
  Filled 2022-07-13: qty 2

## 2022-07-13 MED ORDER — INSULIN ASPART 100 UNIT/ML IJ SOLN
0.0000 [IU] | Freq: Every day | INTRAMUSCULAR | Status: DC
  Administered 2022-07-13: 2 [IU] via SUBCUTANEOUS

## 2022-07-13 MED ORDER — FENTANYL CITRATE (PF) 100 MCG/2ML IJ SOLN: 100.0000 ug | Freq: Once | INTRAMUSCULAR | Status: AC

## 2022-07-13 MED ORDER — METHOCARBAMOL 500 MG PO TABS
500.0000 mg | ORAL_TABLET | Freq: Four times a day (QID) | ORAL | Status: DC | PRN
  Administered 2022-07-13 – 2022-07-16 (×5): 500 mg via ORAL
  Filled 2022-07-13 (×5): qty 1

## 2022-07-13 MED ORDER — KETOROLAC TROMETHAMINE 15 MG/ML IJ SOLN
7.5000 mg | Freq: Four times a day (QID) | INTRAMUSCULAR | Status: AC
  Administered 2022-07-13 – 2022-07-14 (×4): 7.5 mg via INTRAVENOUS
  Filled 2022-07-13 (×4): qty 1

## 2022-07-13 MED ORDER — SUGAMMADEX SODIUM 200 MG/2ML IV SOLN
INTRAVENOUS | Status: DC | PRN
  Administered 2022-07-13: 220 mg via INTRAVENOUS

## 2022-07-13 MED ORDER — DOCUSATE SODIUM 100 MG PO CAPS
100.0000 mg | ORAL_CAPSULE | Freq: Two times a day (BID) | ORAL | Status: DC
  Administered 2022-07-13 – 2022-07-17 (×8): 100 mg via ORAL
  Filled 2022-07-13 (×8): qty 1

## 2022-07-13 MED ORDER — ACETAMINOPHEN 500 MG PO TABS
ORAL_TABLET | ORAL | Status: AC
  Filled 2022-07-13: qty 2

## 2022-07-13 MED ORDER — METOCLOPRAMIDE HCL 5 MG/ML IJ SOLN: 5.0000 mg | Freq: Three times a day (TID) | INTRAMUSCULAR | Status: DC | PRN

## 2022-07-13 MED ORDER — SODIUM CHLORIDE 0.9 % IV SOLN: INTRAVENOUS | Status: DC

## 2022-07-13 MED ORDER — FENTANYL CITRATE (PF) 100 MCG/2ML IJ SOLN
INTRAMUSCULAR | Status: AC
  Administered 2022-07-13: 100 ug via INTRAVENOUS
  Filled 2022-07-13: qty 2

## 2022-07-13 MED ORDER — MIDAZOLAM HCL 2 MG/2ML IJ SOLN: 0.5000 mg | Freq: Once | INTRAMUSCULAR | Status: DC | PRN

## 2022-07-13 MED ORDER — PHENYLEPHRINE HCL-NACL 20-0.9 MG/250ML-% IV SOLN
INTRAVENOUS | Status: DC | PRN
  Administered 2022-07-13: 50 ug/min via INTRAVENOUS

## 2022-07-13 SURGICAL SUPPLY — 79 items
4 MM LEGION OFFSET COUPLER IMPLANT
AUG TIB HEMI KNEE 7-8 5 RT (Joint) ×1 IMPLANT
AUGMENT TIB HEMI KNEE 7-8 5 RT (Joint) IMPLANT
BAG COUNTER SPONGE SURGICOUNT (BAG) ×2 IMPLANT
BAG DECANTER FOR FLEXI CONT (MISCELLANEOUS) IMPLANT
BAG SPNG CNTER NS LX DISP (BAG) ×1
BASEPLATE TIBIAL LEGION SZ7 LT (Plate) IMPLANT
BLADE AVERAGE 25X9 (BLADE) IMPLANT
BLADE SAG 18X100X1.27 (BLADE) ×2 IMPLANT
BLADE SAGITTAL 25.0X1.27X90 (BLADE) ×2 IMPLANT
BLADE SURG 10 STRL SS (BLADE) IMPLANT
BNDG CMPR MED 10X6 ELC LF (GAUZE/BANDAGES/DRESSINGS) ×1
BNDG ELASTIC 6X10 VLCR STRL LF (GAUZE/BANDAGES/DRESSINGS) IMPLANT
BOWL SMART MIX CTS (DISPOSABLE) IMPLANT
BSPLAT TIB 7 CMNT REV F TPR KN (Plate) ×1 IMPLANT
CANISTER WOUND CARE 500ML ATS (WOUND CARE) IMPLANT
CEMENT BONE REFOBACIN R1X40 US (Cement) IMPLANT
CEMENT BONE SIMPLEX SPEEDSET (Cement) IMPLANT
COMP TIB HEMI KNEE 7-8 5 RT (Joint) ×1 IMPLANT
COMPONENT TIB HEMI KN 7-8 5 RT (Joint) IMPLANT
COOLER ICEMAN CLASSIC (MISCELLANEOUS) IMPLANT
COUPLER OFFSET 4MM (INSTRUMENTS) IMPLANT
COVER SURGICAL LIGHT HANDLE (MISCELLANEOUS) ×2 IMPLANT
CUFF TOURN SGL QUICK 34 (TOURNIQUET CUFF)
CUFF TOURN SGL QUICK 42 (TOURNIQUET CUFF) IMPLANT
CUFF TRNQT CYL 34X4.125X (TOURNIQUET CUFF) IMPLANT
DRAPE EXTREMITY T 121X128X90 (DISPOSABLE) IMPLANT
DRAPE ORTHO SPLIT 77X108 STRL (DRAPES) ×2
DRAPE POUCH INSTRU U-SHP 10X18 (DRAPES) ×2 IMPLANT
DRAPE SURG ORHT 6 SPLT 77X108 (DRAPES) ×4 IMPLANT
DRESSING PEEL AND PLAC PRVNA20 (GAUZE/BANDAGES/DRESSINGS) IMPLANT
DRSG PEEL AND PLACE PREVENA 20 (GAUZE/BANDAGES/DRESSINGS) ×1
DURAPREP 26ML APPLICATOR (WOUND CARE) ×4 IMPLANT
ELECT CAUTERY BLADE 6.4 (BLADE) ×2 IMPLANT
ELECT REM PT RETURN 9FT ADLT (ELECTROSURGICAL) ×1
ELECTRODE REM PT RTRN 9FT ADLT (ELECTROSURGICAL) ×2 IMPLANT
GLOVE BIOGEL PI IND STRL 7.5 (GLOVE) ×2 IMPLANT
GLOVE BIOGEL PI INDICATOR 7.5 (GLOVE) ×1
GLOVE SKINSENSE NS SZ7.5 (GLOVE)
GLOVE SKINSENSE STRL SZ7.5 (GLOVE) ×4 IMPLANT
GLOVE SURG SYN 7.5  E (GLOVE) ×3
GLOVE SURG SYN 7.5 E (GLOVE) ×3 IMPLANT
GLOVE SURG SYN 7.5 PF PI (GLOVE) ×4 IMPLANT
GOWN STRL REIN XL XLG (GOWN DISPOSABLE) ×4 IMPLANT
HANDPIECE INTERPULSE COAX TIP (DISPOSABLE) ×1
HEMOSTAT ARISTA ABSORB 3G PWDR (HEMOSTASIS) IMPLANT
HOOD PEEL AWAY FLYTE STAYCOOL (MISCELLANEOUS) ×4 IMPLANT
KIT BASIN OR (CUSTOM PROCEDURE TRAY) ×2 IMPLANT
KIT TURNOVER KIT B (KITS) ×2 IMPLANT
LINER TIB GENESIS 7-8 81 L/R (Liner) IMPLANT
MACHINE ICEMAN COLD THERAPY (MISCELLANEOUS) IMPLANT
MANIFOLD NEPTUNE II (INSTRUMENTS) ×2 IMPLANT
MARKER SKIN DUAL TIP RULER LAB (MISCELLANEOUS) ×2 IMPLANT
NDL SPNL 18GX3.5 QUINCKE PK (NEEDLE) ×2 IMPLANT
NEEDLE SPNL 18GX3.5 QUINCKE PK (NEEDLE) ×1 IMPLANT
NS IRRIG 1000ML POUR BTL (IV SOLUTION) ×2 IMPLANT
PACK TOTAL JOINT (CUSTOM PROCEDURE TRAY) ×2 IMPLANT
PAD ARMBOARD 7.5X6 YLW CONV (MISCELLANEOUS) ×4 IMPLANT
PIN TROCAR 5 (PIN) IMPLANT
SAW OSC TIP CART 19.5X105X1.3 (SAW) ×2 IMPLANT
SET HNDPC FAN SPRY TIP SCT (DISPOSABLE) IMPLANT
SOLUTION PRONTOSAN WOUND 350ML (IRRIGATION / IRRIGATOR) IMPLANT
STEM STRT LEGION 16X120MM (Stem) IMPLANT
SUCTION FRAZIER HANDLE 10FR (MISCELLANEOUS) ×1
SUCTION TUBE FRAZIER 10FR DISP (MISCELLANEOUS) ×2 IMPLANT
SUT ETHILON 2 0 FS 18 (SUTURE) IMPLANT
SUT MNCRL AB 3-0 PS2 27 (SUTURE) ×2 IMPLANT
SUT VIC AB 0 CT1 27 (SUTURE) ×1
SUT VIC AB 0 CT1 27XBRD ANBCTR (SUTURE) ×4 IMPLANT
SUT VIC AB 1 CTX 27 (SUTURE) IMPLANT
SUT VIC AB 1 CTX 36 (SUTURE) ×2
SUT VIC AB 1 CTX36XBRD ANBCTR (SUTURE) ×4 IMPLANT
SUT VIC AB 2-0 CT1 27 (SUTURE) ×3
SUT VIC AB 2-0 CT1 TAPERPNT 27 (SUTURE) ×4 IMPLANT
SYR 50ML LL SCALE MARK (SYRINGE) IMPLANT
TOWEL GREEN STERILE (TOWEL DISPOSABLE) ×2 IMPLANT
TOWEL GREEN STERILE FF (TOWEL DISPOSABLE) ×2 IMPLANT
TRAY FOLEY MTR SLVR 16FR STAT (SET/KITS/TRAYS/PACK) IMPLANT
YANKAUER SUCT BULB TIP NO VENT (SUCTIONS) IMPLANT

## 2022-07-13 NOTE — Progress Notes (Signed)
Pt seen in room alert/oriented in no apparent distress. Hospital valuables policy has been discussed with pt/family with no complaints. Pt orientated to equipments. Menu provided with instructions. Bed in lowest position with 3 side rails up, call bell/room phone within reach, and all wheels locked.

## 2022-07-13 NOTE — Anesthesia Procedure Notes (Signed)
Anesthesia Regional Block: Adductor canal block   Pre-Anesthetic Checklist: , timeout performed,  Correct Patient, Correct Site, Correct Laterality,  Correct Procedure, Correct Position, site marked,  Risks and benefits discussed,  Surgical consent,  Pre-op evaluation,  At surgeon's request and post-op pain management  Laterality: Left and Lower  Prep: chloraprep       Needles:  Injection technique: Single-shot  Needle Type: Echogenic Needle     Needle Length: 9cm  Needle Gauge: 21     Additional Needles:   Procedures:,,,, ultrasound used (permanent image in chart),,    Narrative:  Start time: 07/13/2022 11:41 AM End time: 07/13/2022 11:47 AM Injection made incrementally with aspirations every 5 mL.  Performed by: Personally  Anesthesiologist: Annye Asa, MD  Additional Notes: Pt identified in Holding room.  Monitors applied. Working IV access confirmed. Sterile prep L thigh.  #21ga ECHOgenic Arrow block needle into adductor canal with US guidance.  20cc 0.75% Ropivacaine injected incrementally after negative test dose.  Patient asymptomatic, VSS, no heme aspirated, tolerated well.   Jenita Seashore, MD

## 2022-07-13 NOTE — Anesthesia Procedure Notes (Signed)
Procedure Name: Intubation Date/Time: 07/13/2022 12:34 PM  Performed by: Myna Bright, CRNAPre-anesthesia Checklist: Patient identified, Emergency Drugs available, Suction available and Patient being monitored Patient Re-evaluated:Patient Re-evaluated prior to induction Oxygen Delivery Method: Circle system utilized Preoxygenation: Pre-oxygenation with 100% oxygen Induction Type: IV induction Ventilation: Mask ventilation without difficulty Laryngoscope Size: Mac and 4 Grade View: Grade II Tube type: Oral Tube size: 7.5 mm Number of attempts: 1 Airway Equipment and Method: Stylet Placement Confirmation: ETT inserted through vocal cords under direct vision, positive ETCO2 and breath sounds checked- equal and bilateral Secured at: 22 cm Tube secured with: Tape Dental Injury: Teeth and Oropharynx as per pre-operative assessment

## 2022-07-13 NOTE — Plan of Care (Signed)
  Problem: Education: Goal: Knowledge of General Education information will improve Description: Including pain rating scale, medication(s)/side effects and non-pharmacologic comfort measures Outcome: Progressing   Problem: Clinical Measurements: Goal: Will remain free from infection Outcome: Progressing   Problem: Activity: Goal: Risk for activity intolerance will decrease Outcome: Progressing   Problem: Nutrition: Goal: Adequate nutrition will be maintained Outcome: Progressing   Problem: Elimination: Goal: Will not experience complications related to bowel motility Outcome: Progressing   Problem: Pain Managment: Goal: General experience of comfort will improve Outcome: Progressing

## 2022-07-13 NOTE — Transfer of Care (Signed)
Immediate Anesthesia Transfer of Care Note  Patient: Matthew Schmitt  Procedure(s) Performed: LEFT TOTAL KNEE REVISION, TIBIAL COMPONENT (Left: Knee)  Patient Location: PACU  Anesthesia Type:General  Level of Consciousness: awake, alert  and oriented  Airway & Oxygen Therapy: Patient Spontanous Breathing  Post-op Assessment: Report given to RN and Post -op Vital signs reviewed and stable  Post vital signs: Reviewed and stable  Last Vitals:  Vitals Value Taken Time  BP 148/88 07/13/22 1606  Temp    Pulse 80 07/13/22 1609  Resp 15 07/13/22 1609  SpO2 93 % 07/13/22 1609  Vitals shown include unvalidated device data.  Last Pain:  Vitals:   07/13/22 1028  TempSrc:   PainSc: 3       Patients Stated Pain Goal: 1 (12/45/80 9983)  Complications: No notable events documented.

## 2022-07-13 NOTE — Plan of Care (Signed)
  Problem: Education: Goal: Ability to describe self-care measures that may prevent or decrease complications (Diabetes Survival Skills Education) will improve Outcome: Progressing Goal: Individualized Educational Video(s) Outcome: Progressing   Problem: Coping: Goal: Ability to adjust to condition or change in health will improve Outcome: Progressing   

## 2022-07-13 NOTE — Op Note (Addendum)
Total Knee Revision Arthroplasty Procedure Note  Preoperative diagnosis: Aseptic loosening of left tibial component  Postoperative diagnosis:same  Operative findings: Aseptic loosening of tibial component Well fixed femoral component Unremarkable polyethylene liner  Operative procedure:  Left total knee revision of tibial component. CPT 16109 Application of incisional VAC. CPT [id number]  Surgeon: N. Eduard Roux, MD  Assist: April Green, RNFA  Anesthesia: general adductor canal block, local  Tourniquet time: see anesthesia record  Implants used: [employer] Tibia: 7 revision baseplate with 5 mm medial and lateral augments, 16 x 120 mm pressfit stem with 4 mm offset at 10:00 Polyethylene: 18 mm constrained  Indication: Matthew Schmitt is a 71 y.o. year old male who underwent primary left total knee replacement 5 years ago who developed aseptic loosening of the tibial component.  Infection work up was negative.  Bone scan showed loosening of the tibial component.  Due to worsening pain, he elected to undergo revision of the tibial component.  We have reviewed the risk and benefits of the surgery and they elected to proceed after voicing understanding.  Procedure:  After informed consent was obtained and understanding of the risk were voiced including but not limited to bleeding, infection, damage to surrounding structures including nerves and vessels, blood clots, leg length inequality and the failure to achieve desired results, the operative extremity was marked with verbal confirmation of the patient in the holding area.   The patient was then brought to the operating room and transported to the operating room table in the supine position.  A tourniquet was applied to the operative extremity around the upper thigh. The operative limb was then prepped and draped in the usual sterile fashion and preoperative antibiotics were administered.  A time out was performed prior to the start  of surgery confirming the correct extremity, preoperative antibiotic administration, as well as team members, implants and instruments available for the case. Correct surgical site was also confirmed with preoperative radiographs. The limb was then elevated for exsanguination and the tourniquet was inflated.  The previous surgical scar was incised and extended approximately 2 cm distally and proximally.  Full-thickness flap was developed on the medial side.  Minimal flap development was performed laterally.  Medial parapatellar arthrotomy was performed sharply with a 10 blade.  Joint effusion was evacuated.  There is no gross purulence or evidence of infection.  Circumferential synovectomy was performed in the medial and lateral gutters as well as the suprapatellar pouch.  Lateral release did not have to be made.  Quadriceps snip was also unnecessary.  A medial peel was performed all the way around to the posterior medial proximal tibia.  The fat pads were resected.  This allowed excellent exposure of the knee joint.  The knee was then flexed up and the patella was subluxed laterally and the tibia was subluxed anteriorly.  The polyethylene liner was removed with an osteotome without any difficulty.  The poly looked good.  Retractors were then placed.  The femoral component was inspected and showed no signs of loosening.  We then turned our attention to removal of the tibial component.  Excessive bone growth was removed using a rondure.  We then used an oscillating ACL saw to disrupt the interface between the implant and the cement.  1/4 inch osteotome was used to further elevate the tibial component off of the proximal tibia.  A back slap was then finally used to remove the entire implant.  There was no bone loss which  I was very happy with.  The cement was rongeured off of the bony surface.  A back scratch was used to remove the cement from the canal.  We then turned our attention to tibial preparation.   Sequential reaming was performed up to 16 mm with adequate chatter.  The proximal tibia cutting guide was then assembled and a freshen up cut was performed 1 mm below the medial tibial plateau which was enough to reveal a good bony surface all the way across.  We we then found that a size 7 tibia offered excellent coverage with a 4 mm offset pointed at 10:00.  We then trialed the tibia with 5 mm and 10 mm augments and I found a 5 mm augments to best restore the original joint line.  The proper rotation was found and the tibia was prepared.  Fortunately there was no central bone loss therefore I decided that a cone was not necessary.  I first trialed with a regular PS poly which gave him about 2 to 3 mm of laxity on the medial side and about 4 to 5 mm of laxity on the lateral side.  Once I converted to a constrained poly there was about a millimeter of laxity on the medial side and about 2 mm of laxity on the lateral side.  I decided to use the constrained poly.  The trial components were then removed and the bony surfaces were thoroughly lavaged while the components were assembled on the back table.  The tourniquet was deflated at 110 minutes.  Hemostasis was obtained with cautery and Arista was placed on the surfaces for hemostasis.  We then reinflated the tourniquet for cementation of the implants.  The final components were cemented into place and the cement was allowed to cure for 15 minutes.  A final check of stability show good fit medial lateral stability as well as patella tracking and no liftoff of the poly in deep flexion.  The trial poly was removed and the final 18 mm constrained PS poly was inserted.  The tourniquet was then deflated again and hemostasis was obtained.  Topical bupivacaine and vancomycin powder was placed in the joint and the arthrotomy was closed at 45 degrees of flexion with interrupted #1 Vicryl.  2 g of TXA was placed in the joint with a spinal.  Remaining surgical site was closed  with 2-0 Vicryl and 2-0 nylon.  An incisional wound VAC was placed on top of the incision.  Ace bandage was applied.  Patient tolerated procedure well had no many complications.  Position: supine  Complications: none.  Time Out: performed   Drains/Packing: none  Estimated blood loss: minimal  Returned to Recovery Room: in good condition.   Antibiotics: yes   Mechanical VTE (DVT) Prophylaxis: sequential compression devices, TED thigh-high  Chemical VTE (DVT) Prophylaxis: Resume Xarelto  Fluid Replacement  Crystalloid: see anesthesia record Blood: none  FFP: none   Specimens Removed: 1 to pathology   Sponge and Instrument Count Correct? yes   PACU: portable radiograph - knee AP and Lateral   Plan/RTC: Return in 2 weeks for wound check.   Weight Bearing/Load Lower Extremity: full   Implant Name Type Inv. Item Serial No. Manufacturer Lot No. LRB No. Used Action  CEMENT BONE SIMPLEX SPEEDSET - [id number] Cement CEMENT BONE SIMPLEX SPEEDSET  [employer] [id number]  1 [place]  CEMENT BONE REFOBACIN R1X40 [place] - [id number] Cement CEMENT BONE REFOBACIN R1X40 [place]  [employer]MER [employer](ORTH,TRAU,BIO,SG) [id number] Left 1 Implanted  4 MM LEGION OFFSET COUPLER     [employer] [id number] Left 1 Implanted  LEGION PRESSFIT STEM    [employer] [id number] Left 1 Implanted  5 MM SIZE 7-8 LEGION HEMI STEPPED LT-LAT / RT-MDL SCREW-ON TIBIAL WEDGE    [employer] [id number] Left 1 Implanted  5 MM SIZE 7-8 LEGION HEMI STEPPED LT-MDL / RT-LAT SCREW-ON TIBIAL WEDGE    [employer] [id number] Left 1 Implanted  LEGION REVISION TIBIAL BASEPLATE    [employer] [id number] Left 1 Implanted  SIZE 7-8 18 MM GENESIS II CONSTRAINED ARTICULAR INSERT    [employer] [id number] Left 1 Implanted    N. Eduard Roux, MD [facility] 3:42 PM

## 2022-07-13 NOTE — Anesthesia Postprocedure Evaluation (Signed)
Anesthesia Post Note  Patient: Matthew Schmitt  Procedure(s) Performed: LEFT TOTAL KNEE REVISION, TIBIAL COMPONENT (Left: Knee)     Patient location during evaluation: PACU Anesthesia Type: General Level of consciousness: awake and alert, patient cooperative and oriented Pain management: pain level controlled Vital Signs Assessment: post-procedure vital signs reviewed and stable Respiratory status: spontaneous breathing, nonlabored ventilation and respiratory function stable Cardiovascular status: blood pressure returned to baseline and stable Postop Assessment: no apparent nausea or vomiting Anesthetic complications: no   No notable events documented.  Last Vitals:  Vitals:   07/13/22 1620 07/13/22 1635  BP: (!) 143/87 119/85  Pulse: 74 71  Resp: 12 14  Temp:  36.8 C  SpO2: 93% 91%    Last Pain:  Vitals:   07/13/22 1635  TempSrc:   PainSc: Asleep    LLE Motor Response: Purposeful movement (07/13/22 1635) LLE Sensation: Full sensation (07/13/22 1635)          Anndee Connett,E. Abie Killian

## 2022-07-13 NOTE — H&P (Signed)
PREOPERATIVE H&P  Chief Complaint: left total knee aseptic loosening  HPI: Matthew Schmitt is a 71 y.o. male who presents for surgical treatment of left total knee aseptic loosening.  He denies any changes in medical history.  Past Medical History:  Diagnosis Date   Anxiety    Arthritis    everywhere   Arthrosis of right acromioclavicular joint 01/29/2020   Atrial flutter [facility])    Onset May 2014 Cardioversion done    BPH associated with nocturia    BPH with obstruction/lower urinary tract symptoms 01/04/2017   CAD (coronary artery disease) 04/02/2013   Cath 2012 moderate mid LAD lesion, otherwise not much disease    Diabetes ([place]) 01/18/2018   Dilated aortic root (HCC)    aortic root 44 mm, ascending aorta 46 mm 12/22/21 echo   Encounter for long-term (current) use of other medications 07/27/2013   Essential hypertension, benign    First degree heart block    Heart murmur    mild-moderate AS and AR 01/11/22   History of bilateral knee replacement 02/07/2017   History of bladder stone    History of kidney stones    History of melanoma excision    June 2016  --- s/p  excision right  leg (per pt localized and no recurrence)   Hypertensive heart disease 04/02/2013   Impingement syndrome of right shoulder 01/29/2020   Impotence of organic origin    Left-sided low back pain with left-sided sciatica 01/23/2019   Long term current use of anticoagulant therapy    Mixed hyperlipidemia    slightly   Multiple renal cysts    bilateral   Nephrolithiasis 05/17/2014   Nonischemic cardiomyopathy (HCC)    Osteoarthritis    PAF (paroxysmal atrial fibrillation) ([place])    followed by cardiology  (takes ASA)   RBBB    RBBB (right bundle branch block with left anterior fascicular block) 04/02/2013   Renal oncocytoma of left kidney 06/18/2013   s/p left robotic assited laparoscopic partial nephrectomy 06/18/13   Right ureteral stone    Rotator cuff syndrome of left shoulder 04/17/2016    Status post total left knee replacement 01/01/2020   Tendinopathy of right biceps tendon 01/29/2020   Tendinopathy of right rotator cuff 01/29/2020   Type 2 diabetes mellitus treated with insulin [facility])    endocrinologist-  dr Loanne Drilling   Wears glasses    Past Surgical History:  Procedure Laterality Date   A-FLUTTER ABLATION N/A 10/27/2021   Procedure: A-FLUTTER ABLATION;  Surgeon: Vickie Epley, MD;  Location: [place] [facility];  Service: Cardiovascular;  Laterality: N/A;   CARDIAC CATHETERIZATION  08-17-2011  DR Frederico Hamman [place]   POSSIBLE MODERATE LAD STENOSIS JUST AFTER THE BIFURCATION OF LARGE DIAGONAL BRANCH/ LVEF  40-45%/ MILD GLOBAL HYPODINESIS (CATH DONE FOR ABNORMAL STRESS TEST OF FIXED LATERAL WALL DEFECT & BIFASCICULAR BLAOCK)   CARDIOVERSION N/A 04/02/2013   Procedure: CARDIOVERSION;  Surgeon: Jacolyn Reedy, MD;  Location: [place];  Service: Cardiovascular;  Laterality: N/A;   CARDIOVERSION N/A 12/27/2020   Procedure: CARDIOVERSION;  Surgeon: Skeet Latch, MD;  Location: [place];  Service: Cardiovascular;  Laterality: N/A;   CYSTOSCOPY WITH LITHOLAPAXY N/A 05/18/2015   Procedure: CYSTOSCOPY WITH LITHOLAPAXY;  Surgeon: Rana Snare, MD;  Location: [facility];  Service: Urology;  Laterality: N/A;   CYSTOSCOPY WITH LITHOLAPAXY N/A 01/04/2017   Procedure: CYSTOSCOPY WITH LITHOLAPAXY, Holmium Laser;  Surgeon: Ardis Hughs, MD;  Location: [facility];  Service: Urology;  Laterality: N/A;   CYSTOSCOPY WITH RETROGRADE PYELOGRAM, URETEROSCOPY AND STENT PLACEMENT  11/03/2012   Procedure: CYSTOSCOPY WITH RETROGRADE PYELOGRAM, URETEROSCOPY AND STENT PLACEMENT;  Surgeon: Bernestine Amass, MD;  Location: [facility];  Service: Urology;  Laterality: Left;  Cystoscopy/Left Retrograde/Ureteroscopy/Holmium Laser Litho/Double J Stent   CYSTOSCOPY WITH URETEROSCOPY, STONE BASKETRY AND STENT PLACEMENT Right 05/15/2019   Procedure: CYSTOSCOPY WITH URETEROSCOPY, LASER  LITHOTRIPSY STONE BASKETRY AND STENT PLACEMENT, RIGHT RETROGRADE;  Surgeon: Ardis Hughs, MD;  Location: [facility];  Service: Urology;  Laterality: Right;   HOLMIUM LASER APPLICATION  67/61/9509   Procedure: HOLMIUM LASER APPLICATION;  Surgeon: Bernestine Amass, MD;  Location: [facility];  Service: Urology;  Laterality: Left;   HOLMIUM LASER APPLICATION N/A 02/12/7123   Procedure: HOLMIUM LASER APPLICATION;  Surgeon: Rana Snare, MD;  Location: [facility];  Service: Urology;  Laterality: N/A;   KNEE SURGERY Bilateral Cotton Bilateral June 2016   20th-- left leg //  1st-- right leg w/ skin graft from right thigh (no lymph node bx)   NEPHROLITHOTOMY Right 05/17/2014   Procedure: NEPHROLITHOTOMY PERCUTANEOUS;  Surgeon: Bernestine Amass, MD;  Location: [facility];  Service: Urology;  Laterality: Right;   PILONIDAL CYST EXCISION     ROBOTIC ASSITED PARTIAL NEPHRECTOMY Left 06/18/2013   Procedure: ROBOTIC ASSISTED PARTIAL NEPHRECTOMY;  Surgeon: Dutch Gray, MD;  Location: [facility];  Service: Urology;  Laterality: Left;   ROTATOR CUFF REPAIR Left 07/2016   SHOULDER ARTHROSCOPY WITH ROTATOR CUFF REPAIR AND SUBACROMIAL DECOMPRESSION Right 05/27/2020   Procedure: RIGHT SHOULDER ARTHROSCOPY WITH EXTENSIVE DEBRIDEMENT, DISTAL CLAVICLE EXCISION AND SUBACROMIAL DECOMPRESSION;  Surgeon: Leandrew Koyanagi, MD;  Location: [place];  Service: Orthopedics;  Laterality: Right;   TEE WITH CARDIOVERSION  12/27/2020   TEE WITHOUT CARDIOVERSION N/A 12/27/2020   Procedure: TRANSESOPHAGEAL ECHOCARDIOGRAM (TEE);  Surgeon: Skeet Latch, MD;  Location: [facility];  Service: Cardiovascular;  Laterality: N/A;   TONSILLECTOMY  38  (age 36)   TOTAL KNEE ARTHROPLASTY Left 02/07/2017   Procedure: LEFT TOTAL KNEE ARTHROPLASTY;  Surgeon: Leandrew Koyanagi, MD;  Location: [place];  Service: Orthopedics;  Laterality: Left;   TOTAL KNEE ARTHROPLASTY Right  04/25/2017   Procedure: RIGHT TOTAL KNEE ARTHROPLASTY;  Surgeon: Leandrew Koyanagi, MD;  Location: [place];  Service: Orthopedics;  Laterality: Right;   TRANSURETHRAL RESECTION OF PROSTATE N/A 01/04/2017   Procedure: TRANSURETHRAL RESECTION OF THE PROSTATE (TURP);  Surgeon: Ardis Hughs, MD;  Location[facility];  Service: Urology;  Laterality: N/A;   Social History   Socioeconomic History   Marital status: Married    Spouse name: Not on file   Number of children: 2   Years of education: 16   Highest education level: Not on file  Occupational History   Occupation: Government social research officer, Psychologist, occupational  Tobacco Use   Smoking status: Former    Packs/day: 1.00    Years: 6.00    Total pack years: 6.00    Types: Cigarettes    Quit date: 10/31/1969    Years since quitting: 52.7   Smokeless tobacco: Never  Vaping Use   Vaping Use: Never used  Substance and Sexual Activity   Alcohol use: Yes    Alcohol/week: 0.0 standard drinks of alcohol    Comment: OCCASIONAL   Drug use: No   Sexual activity: Never  Other Topics Concern   Not on file  Social  History Narrative   Regular exercise-no   Social Determinants of Health   Financial Resource Strain: Not on file  Food Insecurity: Not on file  Transportation Needs: Not on file  Physical Activity: Not on file  Stress: Not on file  Social Connections: Not on file   Family History  Problem Relation Age of Onset   Arthritis Father    Hypertension Father    Diabetes Father    Arthritis Mother    Hypertension Mother    Heart attack Brother 43   Diabetes Brother    Lymphoma Paternal Grandfather    Cancer Maternal Uncle    Cancer Paternal Uncle        type unknown   Allergies  Allergen Reactions   Actos [Pioglitazone] Swelling and Cough    SWELLING REACTION UNSPECIFIED  EDEMA   Prior to Admission medications   Medication Sig Start Date End Date Taking? Authorizing Provider  dapagliflozin propanediol (FARXIGA) 10 MG TABS tablet Take  1 tablet (10 mg total) by mouth daily before breakfast. 03/30/22  Yes Jerline Pain, MD  Insulin Glargine (BASAGLAR KWIKPEN) 100 UNIT/ML Inject 80 Units into the skin every morning. And pen needles 1/day Patient taking differently: Inject 90 Units into the skin in the morning. And pen needles 1/day 01/05/22  Yes Renato Shin, MD  metoprolol succinate (TOPROL-XL) 50 MG 24 hr tablet TAKE 1 TABLET DAILY, TAKE WITH OR IMMEDIATELY FOLLOWING A MEAL 03/30/22  Yes Jerline Pain, MD  rivaroxaban (XARELTO) 20 MG TABS tablet Take 1 tablet (20 mg total) by mouth daily with supper. 02/20/22  Yes Jerline Pain, MD  rosuvastatin (CRESTOR) 20 MG tablet TAKE 1 TABLET DAILY 02/09/22  Yes Skains, Thana Farr, MD  sacubitril-valsartan (ENTRESTO) 49-51 MG Take 1 tablet by mouth 2 (two) times daily. 02/20/22  Yes Jerline Pain, MD  spironolactone (ALDACTONE) 25 MG tablet TAKE ONE-HALF (1/2) TABLET DAILY 02/09/22  Yes Jerline Pain, MD  Insulin Pen Needle (PEN NEEDLES) 30G X 8 MM MISC 1 each by Does not apply route daily. E11.9 07/07/20   Renato Shin, MD  Lancets [facility] DELICA PLUS [id number]) [place] 1 each by Other route 2 (two) times daily. E11.9 07/07/20   Renato Shin, MD  nitroGLYCERIN (NITROSTAT) 0.4 MG SL tablet Place 0.4 mg under the tongue every 5 (five) minutes x 3 doses as needed for chest pain.    [provider]  [facility] ULTRA test strip USE 1 STRIP TWICE A DAY 03/30/22   Shamleffer, Melanie Crazier, MD  Semaglutide (RYBELSUS) 3 MG TABS Take 3 mg by mouth daily. 07/06/22   Philemon Kingdom, MD  trolamine salicylate (ASPERCREME) 10 % cream Apply 1 application  topically 3 (three) times daily as needed for muscle pain.    [provider]     Positive ROS: All other systems have been reviewed and were otherwise negative with the exception of those mentioned in the HPI and as above.  Physical Exam: General: Alert, no acute distress Cardiovascular: No pedal edema Respiratory: No cyanosis, no use of  accessory musculature GI: abdomen soft Skin: No lesions in the area of chief complaint Neurologic: Sensation intact distally Psychiatric: Patient is competent for consent with normal mood and affect Lymphatic: no lymphedema  MUSCULOSKELETAL: exam stable  Assessment: left total knee aseptic loosening  Plan: Plan for Procedure(s): LEFT TOTAL KNEE REVISION, TIBIAL COMPONENT  The risks benefits and alternatives were discussed with the patient including but not limited to the risks of nonoperative treatment,  versus surgical intervention including infection, bleeding, nerve injury,  blood clots, cardiopulmonary complications, morbidity, mortality, among others, and they were willing to proceed.   Eduard Roux, MD 07/13/2022 12:10 PM

## 2022-07-14 LAB — CBC
HCT: 43 % (ref 39.0–52.0)
Hemoglobin: 14 g/dL (ref 13.0–17.0)
MCH: 28.1 pg (ref 26.0–34.0)
MCHC: 32.6 g/dL (ref 30.0–36.0)
MCV: 86.3 fL (ref 80.0–100.0)
Platelets: 141 K/uL — ABNORMAL LOW (ref 150–400)
RBC: 4.98 MIL/uL (ref 4.22–5.81)
RDW: 13.4 % (ref 11.5–15.5)
WBC: 9.2 K/uL (ref 4.0–10.5)
nRBC: 0 % (ref 0.0–0.2)

## 2022-07-14 LAB — GLUCOSE, CAPILLARY
Glucose-Capillary: 227 mg/dL — ABNORMAL HIGH (ref 70–99)
Glucose-Capillary: 158 mg/dL — ABNORMAL HIGH (ref 70–99)
Glucose-Capillary: 235 mg/dL — ABNORMAL HIGH (ref 70–99)
Glucose-Capillary: 196 mg/dL — ABNORMAL HIGH (ref 70–99)
Glucose-Capillary: 180 mg/dL — ABNORMAL HIGH (ref 70–99)

## 2022-07-14 MED ORDER — METHOCARBAMOL 750 MG PO TABS: 750.0000 mg | ORAL_TABLET | Freq: Two times a day (BID) | ORAL | 3 refills | Status: DC | PRN

## 2022-07-14 MED ORDER — ONDANSETRON HCL 4 MG PO TABS: 4.0000 mg | ORAL_TABLET | Freq: Three times a day (TID) | ORAL | 0 refills | Status: DC | PRN

## 2022-07-14 MED ORDER — OXYCODONE HCL 5 MG PO TABS: 5.0000 mg | ORAL_TABLET | Freq: Three times a day (TID) | ORAL | 0 refills | Status: DC | PRN

## 2022-07-14 NOTE — Progress Notes (Signed)
Orthopedic Tech Progress Note Patient Details:  Matthew Schmitt 01-10-1951 [id number]  CPM Left Knee CPM Left Knee: On Left Knee Flexion (Degrees): 60 Left Knee Extension (Degrees): 0  Post Interventions Patient Tolerated: Well  [name] [name] 07/14/2022, 8:41 AM

## 2022-07-14 NOTE — Evaluation (Signed)
Physical Therapy Evaluation Patient Details Name: Matthew Schmitt MRN: [id number] DOB: 07-24-1951 Today's Date: 07/14/2022  History of Present Illness  Pt is a 71 y.o. M who presents s/p left total knee revision 07/13/2022. Significant PMH: atrial flutter, CAD, bilateral TKA, RBBB, left rotator cuff syndrome, T2DM.  Clinical Impression  PTA, pt lives with his spouse and is a Government social research officer for a [employer] Pt presents with good pain control and is overall mobilizing well. Ambulating 150 ft with a walker at a modI level, with a step to gait pattern. Pt achieving 0-105 degrees seated knee flexion. Reviewed seated HEP for strengthening and ROM (written instruction provided). Plan for stair training prior to d/c.     Recommendations for follow up therapy are one component of a multi-disciplinary discharge planning process, led by the attending physician.  Recommendations may be updated based on patient status, additional functional criteria and insurance authorization.  Follow Up Recommendations Outpatient PT      Assistance Recommended at Discharge PRN  Patient can return home with the following  Assistance with cooking/housework;Assist for transportation;Help with stairs or ramp for entrance    Equipment Recommendations None recommended by PT  Recommendations for Other Services       Functional Status Assessment Patient has had a recent decline in their functional status and demonstrates the ability to make significant improvements in function in a reasonable and predictable amount of time.     Precautions / Restrictions Precautions Precautions: None Restrictions Weight Bearing Restrictions: Yes LLE Weight Bearing: Weight bearing as tolerated      Mobility  Bed Mobility Overal bed mobility: Modified Independent                  Transfers Overall transfer level: Modified independent Equipment used: Rolling walker (2 wheels)                     Ambulation/Gait Ambulation/Gait assistance: Modified independent (Device/Increase time) Gait Distance (Feet): 150 Feet Assistive device: Rolling walker (2 wheels) Gait Pattern/deviations: Step-to pattern, Decreased stance time - left, Decreased dorsiflexion - left, Decreased weight shift to left       General Gait Details: Cues for L heel strike at initial contact and walker use  Stairs            Wheelchair Mobility    Modified Rankin (Stroke Patients Only)       Balance Overall balance assessment: Mild deficits observed, not formally tested                                           Pertinent Vitals/Pain Pain Assessment Pain Assessment: 0-10 Pain Score: 2  Pain Location: L knee Pain Descriptors / Indicators: Operative site guarding Pain Intervention(s): Monitored during session    Home Living Family/patient expects to be discharged to:: Private residence Living Arrangements: Spouse/significant other Available Help at Discharge: Family Type of Home: House Home Access: Stairs to enter Entrance Stairs-Rails: None Entrance Stairs-Number of Steps: 2   Home Layout: Able to live on main level with bedroom/bathroom Home Equipment: Advice worker (2 wheels)      Prior Function Prior Level of Function : Independent/Modified Independent             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 At least 3/5 strength. Able to perform SLR, LAQ    Cervical / Trunk Assessment Cervical / Trunk Assessment: Normal  Communication   Communication: No difficulties  Cognition Arousal/Alertness: Awake/alert Behavior During Therapy: WFL for tasks  assessed/performed Overall Cognitive Status: Within Functional Limits for tasks assessed                                          General Comments      Exercises Total Joint Exercises Quad Sets: Left, 10 reps, Seated Heel Slides: Left, 5 reps, Seated Long Arc Quad: Left, 10 reps, Seated Goniometric ROM: 0-105 degrees   Assessment/Plan    PT Assessment Patient needs continued PT services  PT Problem List Decreased strength;Decreased range of motion;Decreased mobility;Pain       PT Treatment Interventions DME instruction;Gait training;Stair training;Functional mobility training;Therapeutic activities;Therapeutic exercise;Balance training;Patient/family education    PT Goals (Current goals can be found in the Care Plan section)  Acute Rehab PT Goals Patient Stated Goal: go home PT Goal Formulation: With patient Time For Goal Achievement: 07/28/22 Potential to Achieve Goals: Good    Frequency Min 5X/week     Co-evaluation               AM-PAC PT "6 Clicks" Mobility  Outcome Measure Help needed turning from your back to your side while in a flat bed without using bedrails?: None Help needed moving from lying on your back to sitting on the side of a flat bed without using bedrails?: None Help needed moving to and from a bed to a chair (including a wheelchair)?: None Help needed standing up from a chair using your arms (e.g., wheelchair or bedside chair)?: None Help needed to walk in hospital room?: None Help needed climbing 3-5 steps with a railing? : A Little 6 Click Score: 23    End of Session   Activity Tolerance: Patient tolerated treatment well Patient left: in chair;with call bell/phone within reach Nurse Communication: Mobility status PT Visit Diagnosis: Other abnormalities of gait and mobility (R26.89);Pain Pain - Right/Left: Left Pain - part of body: Knee    Time: 0821-0847 PT Time Calculation (min) (ACUTE ONLY): 26 min   Charges:    PT Evaluation $PT Eval Low Complexity: 1 Low PT Treatments $Gait Training: 8-22 mins        Wyona Almas, PT, DPT Acute Rehabilitation Services Office [phone]   Deno Etienne 07/14/2022, 8:54 AM

## 2022-07-14 NOTE — Evaluation (Signed)
Occupational Therapy Evaluation Patient Details Name: Matthew Schmitt MRN: [id number] DOB: 1951/09/08 Today's Date: 07/14/2022   History of Present Illness Pt is a 71 y.o. M who presents s/p left total knee revision 07/13/2022. Significant PMH: atrial flutter, CAD, bilateral TKA, RBBB, left rotator cuff syndrome, T2DM.   Clinical Impression   Pt demonstrates ability to complete functional mobility and ADL at modified independent level with use of RW. Pt reporting 2/10 pain level. Educated pt on importance of mobility and rehabilitation progression. Pt states he would prefer to complete rehab at home and is not currently interested in outpatient followup, communicated this with PT. Patient evaluated by Occupational Therapy with no further acute OT needs identified. All education has been completed and the patient has no further questions. See below for any follow-up Occupational Therapy or equipment needs. OT to sign off. Thank you for referral.        Recommendations for follow up therapy are one component of a multi-disciplinary discharge planning process, led by the attending physician.  Recommendations may be updated based on patient status, additional functional criteria and insurance authorization.   Follow Up Recommendations  No OT follow up    Assistance Recommended at Discharge None  Patient can return home with the following      Functional Status Assessment  Patient has had a recent decline in their functional status and demonstrates the ability to make significant improvements in function in a reasonable and predictable amount of time.  Equipment Recommendations       Recommendations for Other Services       Precautions / Restrictions Precautions Precautions: None Restrictions Weight Bearing Restrictions: Yes LLE Weight Bearing: Weight bearing as tolerated      Mobility Bed Mobility Overal bed mobility: Modified Independent                   Transfers Overall transfer level: Modified independent Equipment used: Rolling walker (2 wheels)                      Balance Overall balance assessment: Mild deficits observed, not formally tested                                         ADL either performed or assessed with clinical judgement   ADL Overall ADL's : Modified independent                                       General ADL Comments: pt demonstrated ability to complete LB dressing, toilet transfer and grooming while standing      Pertinent Vitals/Pain Pain Assessment Pain Assessment: 0-10 Pain Score: 2  Breathing: normal Pain Location: L knee Pain Descriptors / Indicators: Operative site guarding Pain Intervention(s): Monitored during session, Limited activity within patient's tolerance     Hand Dominance Right   Extremity/Trunk Assessment Upper Extremity Assessment Upper Extremity Assessment: Overall WFL for tasks assessed   Lower Extremity Assessment Lower Extremity Assessment: Defer to PT evaluation LLE Deficits / Details: s/p total knee revision. At least 3/5 strength. Able to perform SLR, LAQ   Cervical / Trunk Assessment Cervical / Trunk Assessment: Normal   Communication Communication Communication: No difficulties   Cognition Arousal/Alertness: Awake/alert Behavior During Therapy: WFL for tasks assessed/performed Overall Cognitive Status: Within Functional  Limits for tasks assessed                                                  Home Living Family/patient expects to be discharged to:: Private residence Living Arrangements: Spouse/significant other Available Help at Discharge: Family Type of Home: House Home Access: Stairs to enter Technical brewer of Steps: 2 Entrance Stairs-Rails: None Home Layout: Able to live on main level with bedroom/bathroom     Bathroom Shower/Tub: Occupational psychologist:  Handicapped height     Home Equipment: Advice worker (2 wheels);BSC/3in1          Prior Functioning/Environment Prior Level of Function : Independent/Modified Independent             Mobility Comments: Nurse, 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 Comments: independent        OT Problem List: Impaired balance (sitting and/or standing)      OT Treatment/Interventions:      OT Goals(Current goals can be found in the care plan section) Acute Rehab OT Goals Patient Stated Goal: to go home OT Goal Formulation: With patient Time For Goal Achievement: 07/28/22 Potential to Achieve Goals: Good  OT Frequency:      Co-evaluation              AM-PAC OT "6 Clicks" Daily Activity     Outcome Measure Help from another person eating meals?: None Help from another person taking care of personal grooming?: None Help from another person toileting, which includes using toliet, bedpan, or urinal?: None Help from another person bathing (including washing, rinsing, drying)?: None Help from another person to put on and taking off regular upper body clothing?: None Help from another person to put on and taking off regular lower body clothing?: None 6 Click Score: 24   End of Session   Activity Tolerance:   Patient left:    OT Visit Diagnosis: Other abnormalities of gait and mobility (R26.89)                Time: 6222-9798 OT Time Calculation (min): 13 min Charges:  OT General Charges $OT Visit: 1 Visit OT Evaluation $OT Eval Low Complexity: 1 Low  Dionis Autry OTR/L Acute Rehabilitation Services Office: [phone]   Wyn Forster 07/14/2022, 11:33 AM

## 2022-07-14 NOTE — Progress Notes (Signed)
   Subjective:  Patient reports pain as severe at times but overall well controlled.  No events.  Objective:   VITALS:   Vitals:   07/14/22 0015 07/14/22 0020 07/14/22 0025 07/14/22 0030  BP:      Pulse: 60 (!) 58 60 65  Resp: '11 10 11 15  '$ Temp:      TempSrc:      SpO2: 96% 98% 96% 98%  Weight:      Height:        Neurovascular intact Sensation intact distally Intact pulses distally Dorsiflexion/Plantar flexion intact Incision: IVAC in placed, no drainage in canister   Lab Results  Component Value Date   WBC 9.2 07/14/2022   HGB 14.0 07/14/2022   HCT 43.0 07/14/2022   MCV 86.3 07/14/2022   PLT 141 (L) 07/14/2022     Assessment/Plan:  1 Day Post-Op   - Up with PT/OT - DVT ppx - SCDs, ambulation, resume xarelto today - WBAT operative extremity, CPM while in bed - Pain control - Discharge planning - anticipate that he will do well with PT and d/c home Sunday or Monday.  Anticipated LOS equal to or greater than 2 midnights due to - Age 72 and older with one or more of the following:  - Obesity  - Expected need for hospital services (PT, OT, Nursing) required for safe  discharge  - Anticipated need for postoperative skilled nursing care or inpatient rehab  - Active co-morbidities: Diabetes and Coronary Artery Disease  Eduard Roux 07/14/2022, 8:20 AM

## 2022-07-14 NOTE — Plan of Care (Signed)
  Problem: Education: Goal: Ability to describe self-care measures that may prevent or decrease complications (Diabetes Survival Skills Education) will improve Outcome: Progressing Goal: Individualized Educational Video(s) Outcome: Progressing   Problem: Coping: Goal: Ability to adjust to condition or change in health will improve Outcome: Progressing   

## 2022-07-15 ENCOUNTER — Inpatient Hospital Stay (HOSPITAL_COMMUNITY): Payer: BC Managed Care – PPO

## 2022-07-15 DIAGNOSIS — M62831 Muscle spasm of calf: Secondary | ICD-10-CM

## 2022-07-15 DIAGNOSIS — M7989 Other specified soft tissue disorders: Secondary | ICD-10-CM

## 2022-07-15 LAB — GLUCOSE, CAPILLARY
Glucose-Capillary: 173 mg/dL — ABNORMAL HIGH (ref 70–99)
Glucose-Capillary: 123 mg/dL — ABNORMAL HIGH (ref 70–99)
Glucose-Capillary: 75 mg/dL (ref 70–99)
Glucose-Capillary: 122 mg/dL — ABNORMAL HIGH (ref 70–99)

## 2022-07-15 MED ORDER — RIVAROXABAN 15 MG PO TABS
15.0000 mg | ORAL_TABLET | Freq: Two times a day (BID) | ORAL | Status: DC
  Administered 2022-07-15 – 2022-07-17 (×4): 15 mg via ORAL
  Filled 2022-07-15 (×4): qty 1

## 2022-07-15 NOTE — Progress Notes (Signed)
Physical Therapy Treatment Patient Details Name: Matthew Schmitt MRN: [id number] DOB: [birth year] Today's Date: 07/15/2022   History of Present Illness Pt is a 70 y.o. M who presents s/p left total knee revision 07/13/2022. Significant PMH: atrial flutter, CAD, bilateral TKA, RBBB, left rotator cuff syndrome, T2DM.    PT Comments    Pt met his physical therapy goals during his inpatient stay. Ambulating 540 ft with a RW and negotiated 4 steps modI. Achieving 105 degrees seated knee flexion. Reports L gastroc cramping overnight (MD aware), but states improved in AM with mild soreness. Ice reapplied post session. Pt with no further questions/concerns. No further acute PT needs; thank you for this consult.    Recommendations for follow up therapy are one component of a multi-disciplinary discharge planning process, led by the attending physician.  Recommendations may be updated based on patient status, additional functional criteria and insurance authorization.  Follow Up Recommendations  No PT follow up (pt declining OPPT)     Assistance Recommended at Discharge PRN  Patient can return home with the following Assistance with cooking/housework;Assist for transportation;Help with stairs or ramp for entrance   Equipment Recommendations  None recommended by PT    Recommendations for Other Services       Precautions / Restrictions Precautions Precautions: None Restrictions Weight Bearing Restrictions: Yes LLE Weight Bearing: Weight bearing as tolerated     Mobility  Bed Mobility Overal bed mobility: Modified Independent                  Transfers Overall transfer level: Modified independent Equipment used: Rolling walker (2 wheels)                    Ambulation/Gait Ambulation/Gait assistance: Modified independent (Device/Increase time) Gait Distance (Feet): 540 Feet Assistive device: Rolling walker (2 wheels) Gait Pattern/deviations: Step-to pattern, Decreased  stance time - left, Decreased dorsiflexion - left, Decreased weight shift to left, Step-through pattern       General Gait Details: Progressing to step through pattern, good L heel strike, cues for equal step lengths   Stairs Stairs: Yes Stairs assistance: Modified independent (Device/Increase time) Stair Management: One rail Right, With walker Number of Stairs: 4 General stair comments: Pt negotiated 1 platform step with RW and then additional 3 steps with R rail to simulate home set up. Cues for sequencing/technique   Wheelchair Mobility    Modified Rankin (Stroke Patients Only)       Balance Overall balance assessment: Mild deficits observed, not formally tested                                          Cognition Arousal/Alertness: Awake/alert Behavior During Therapy: WFL for tasks assessed/performed Overall Cognitive Status: Within Functional Limits for tasks assessed                                          Exercises Total Joint Exercises Goniometric ROM: 0-105 degrees    General Comments        Pertinent Vitals/Pain Pain Assessment Pain Assessment: Faces Faces Pain Scale: Hurts a little bit Pain Location: L knee Pain Descriptors / Indicators: Operative site guarding, Sore Pain Intervention(s): Monitored during session    Home Living  Prior Function            PT Goals (current goals can now be found in the care plan section) Acute Rehab PT Goals Patient Stated Goal: go home PT Goal Formulation: With patient Time For Goal Achievement: 07/28/22 Potential to Achieve Goals: Good Progress towards PT goals: Goals met/education completed, patient discharged from PT    Frequency    Min 5X/week      PT Plan Discharge plan needs to be updated    Co-evaluation              AM-PAC PT "6 Clicks" Mobility   Outcome Measure  Help needed turning from your back to your side  while in a flat bed without using bedrails?: None Help needed moving from lying on your back to sitting on the side of a flat bed without using bedrails?: None Help needed moving to and from a bed to a chair (including a wheelchair)?: None Help needed standing up from a chair using your arms (e.g., wheelchair or bedside chair)?: None Help needed to walk in hospital room?: None Help needed climbing 3-5 steps with a railing? : None 6 Click Score: 24    End of Session   Activity Tolerance: Patient tolerated treatment well Patient left: with call bell/phone within reach;in bed;in CPM Nurse Communication: Mobility status PT Visit Diagnosis: Other abnormalities of gait and mobility (R26.89);Pain Pain - Right/Left: Left Pain - part of body: Knee     Time: 0821-0858 PT Time Calculation (min) (ACUTE ONLY): 37 min  Charges:  $Gait Training: 23-37 mins                     Wyona Almas, PT, DPT Acute Rehabilitation Services Office [phone]    Deno Etienne 07/15/2022, 9:45 AM

## 2022-07-15 NOTE — Progress Notes (Signed)
  Subjective: Patient stable.  Is very motivated to do well with physical therapy.  Has been up in the halls this morning and plans to do stairs this morning as well   Objective: Vital signs in last 24 hours: Temp:  [98.3 F (36.8 C)-98.6 F (37 C)] 98.4 F (36.9 C) (08/27 0824) Pulse Rate:  [57-92] 60 (08/27 0824) Resp:  [16] 16 (08/26 2032) BP: (117-172)/(68-89) 172/89 (08/27 0824) SpO2:  [90 %-98 %] 94 % (08/27 0824)  Intake/Output from previous day: 08/26 0701 - 08/27 0700 In: 240 [P.O.:240] Out: 1350 [Urine:1350] Intake/Output this shift: No intake/output data recorded.  Exam:  Dorsiflexion/Plantar flexion intact No cellulitis present Compartment soft  Labs: Recent Labs    07/14/22 0157  HGB 14.0   Recent Labs    07/14/22 0157  WBC 9.2  RBC 4.98  HCT 43.0  PLT 141*   No results for input(s): "NA", "K", "CL", "CO2", "BUN", "CREATININE", "GLUCOSE", "CALCIUM" in the last 72 hours. No results for input(s): "LABPT", "INR" in the last 72 hours.  Assessment/Plan: Plan at this time is continue with therapy and okay to discharge once pain is controlled and sufficient mobility and independence confirmed with physical therapy..  Likely tomorrow per Dr. Darryll Capers 07/15/2022, 8:47 AM

## 2022-07-15 NOTE — Progress Notes (Signed)
Mobility Specialist Progress Note   07/15/22 1903  Mobility  Activity Ambulated with assistance in hallway  Level of Assistance Contact guard assist, steadying assist  Assistive Device Front wheel walker  LLE Weight Bearing WBAT  Distance Ambulated (ft) 225 ft  Activity Response Tolerated well  $Mobility charge 1 Mobility   Pre Mobility:  73 HR, 95% SpO2 During Mobility: 94 HR, 90% SpO2 Post Mobility: 77 HR, 95% SpO2  Pt received on EOB c/o pain in LLE but agreeable to mobility. Antalgic like gait throughout ambulation, session limited to pain increase. Returned back to bed w/o fault. Left w/ call bell in reach and needs met.  Holland Falling Mobility Specialist [facility] #:  [phone] Acute Rehab Office:  [phone]

## 2022-07-15 NOTE — Progress Notes (Signed)
Doppler positive for LLE DVT.  Will increase xarelto to 15 mg BID.  Will let Dr. Marlou Porch know of this finding to determine if Matthew Schmitt is to follow up with [facility] or another provider for outpatient DVT management.    Azucena Cecil, MD [facility] 2:56 PM

## 2022-07-15 NOTE — Progress Notes (Signed)
  Transition of Care [facility]) Screening Note   Patient Details  Name: Matthew Schmitt Date of Birth: 04-23-1951   Transition of Care [facility]) CM/SW Contact:    Bartholomew Crews, RN Phone Number: [phone] 07/15/2022, 12:01 PM    Transition of Care Department [facility]) has reviewed patient and no TOC needs have been identified at this time. We will continue to monitor patient advancement through interdisciplinary progression rounds. If new patient transition needs arise, please place a TOC consult.

## 2022-07-15 NOTE — Progress Notes (Addendum)
VASCULAR LAB    Left lower extremity venous duplex has been performed.  See CV proc for preliminary results.  Called critical results to Dr. Erlinda Hong via the answering service.  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  Patrick Sohm, RVT 07/15/2022, 10:50 AM

## 2022-07-15 NOTE — Plan of Care (Signed)
Problem: Education: Goal: Ability to describe self-care measures that may prevent or decrease complications (Diabetes Survival Skills Education) will improve Outcome: Progressing Goal: Individualized Educational Video(s) Outcome: Progressing   Problem: Coping: Goal: Ability to adjust to condition or change in health will improve Outcome: Progressing   Problem: Fluid Volume: Goal: Ability to maintain a balanced intake and output will improve Outcome: Progressing   Problem: Health Behavior/Discharge Planning: Goal: Ability to identify and utilize available resources and services will improve Outcome: Progressing Goal: Ability to manage health-related needs will improve Outcome: Progressing   Problem: Metabolic: Goal: Ability to maintain appropriate glucose levels will improve Outcome: Progressing   Problem: Nutritional: Goal: Maintenance of adequate nutrition will improve Outcome: Progressing Goal: Progress toward achieving an optimal weight will improve Outcome: Progressing   Problem: Skin Integrity: Goal: Risk for impaired skin integrity will decrease Outcome: Progressing   Problem: Tissue Perfusion: Goal: Adequacy of tissue perfusion will improve Outcome: Progressing   Problem: Education: Goal: Knowledge of the prescribed therapeutic regimen will improve Outcome: Progressing Goal: Individualized Educational Video(s) Outcome: Progressing   Problem: Activity: Goal: Ability to avoid complications of mobility impairment will improve Outcome: Progressing Goal: Range of joint motion will improve Outcome: Progressing   Problem: Clinical Measurements: Goal: Postoperative complications will be avoided or minimized Outcome: Progressing   Problem: Pain Management: Goal: Pain level will decrease with appropriate interventions Outcome: Progressing   Problem: Skin Integrity: Goal: Will show signs of wound healing Outcome: Progressing   Problem: Education: Goal:  Knowledge of General Education information will improve Description: Including pain rating scale, medication(s)/side effects and non-pharmacologic comfort measures Outcome: Progressing   Problem: Health Behavior/Discharge Planning: Goal: Ability to manage health-related needs will improve Outcome: Progressing   Problem: Clinical Measurements: Goal: Ability to maintain clinical measurements within normal limits will improve Outcome: Progressing Goal: Will remain free from infection Outcome: Progressing Goal: Diagnostic test results will improve Outcome: Progressing Goal: Respiratory complications will improve Outcome: Progressing Goal: Cardiovascular complication will be avoided Outcome: Progressing   Problem: Activity: Goal: Risk for activity intolerance will decrease Outcome: Progressing   Problem: Nutrition: Goal: Adequate nutrition will be maintained Outcome: Progressing   Problem: Coping: Goal: Level of anxiety will decrease Outcome: Progressing   Problem: Elimination: Goal: Will not experience complications related to bowel motility Outcome: Progressing Goal: Will not experience complications related to urinary retention Outcome: Progressing   Problem: Pain Managment: Goal: General experience of comfort will improve Outcome: Progressing   Problem: Safety: Goal: Ability to remain free from injury will improve Outcome: Progressing   Problem: Skin Integrity: Goal: Risk for impaired skin integrity will decrease Outcome: Progressing   Problem: Education: Goal: Knowledge of General Education information will improve Description: Including pain rating scale, medication(s)/side effects and non-pharmacologic comfort measures Outcome: Progressing   Problem: Health Behavior/Discharge Planning: Goal: Ability to manage health-related needs will improve Outcome: Progressing   Problem: Clinical Measurements: Goal: Ability to maintain clinical measurements within  normal limits will improve Outcome: Progressing Goal: Will remain free from infection Outcome: Progressing Goal: Diagnostic test results will improve Outcome: Progressing Goal: Respiratory complications will improve Outcome: Progressing Goal: Cardiovascular complication will be avoided Outcome: Progressing   Problem: Activity: Goal: Risk for activity intolerance will decrease Outcome: Progressing   Problem: Nutrition: Goal: Adequate nutrition will be maintained Outcome: Progressing   Problem: Coping: Goal: Level of anxiety will decrease Outcome: Progressing   Problem: Elimination: Goal: Will not experience complications related to bowel motility Outcome: Progressing Goal: Will not experience complications related to  urinary retention Outcome: Progressing

## 2022-07-15 NOTE — Progress Notes (Signed)
Shaden doing well overall.  Had some calf pain last night, much better this morning, will order doppler to r/o DVT. Making progress with PT.  Completed stairs and walked a couple of laps this morning. Will keep here until tomorrow for pain control.   Will switch over to prevena unit tomorrow upon discharge home. Follow up in office in 1 week to remove prevena.   Azucena Cecil, MD [facility] 8:53 AM

## 2022-07-15 NOTE — Plan of Care (Signed)

## 2022-07-16 ENCOUNTER — Inpatient Hospital Stay (HOSPITAL_COMMUNITY): Payer: BC Managed Care – PPO

## 2022-07-16 ENCOUNTER — Telehealth (HOSPITAL_COMMUNITY): Payer: Self-pay | Admitting: Pharmacy Technician

## 2022-07-16 ENCOUNTER — Other Ambulatory Visit (HOSPITAL_COMMUNITY): Payer: Self-pay

## 2022-07-16 LAB — GLUCOSE, CAPILLARY
Glucose-Capillary: 81 mg/dL (ref 70–99)
Glucose-Capillary: 84 mg/dL (ref 70–99)
Glucose-Capillary: 56 mg/dL — ABNORMAL LOW (ref 70–99)
Glucose-Capillary: 139 mg/dL — ABNORMAL HIGH (ref 70–99)
Glucose-Capillary: 128 mg/dL — ABNORMAL HIGH (ref 70–99)
Glucose-Capillary: 132 mg/dL — ABNORMAL HIGH (ref 70–99)

## 2022-07-16 MED ORDER — RIVAROXABAN 20 MG PO TABS: 20.0000 mg | ORAL_TABLET | Freq: Every day | ORAL | Status: DC

## 2022-07-16 NOTE — TOC Benefit Eligibility Note (Signed)
Patient Teacher, English as a foreign language completed.    The patient is currently admitted and upon discharge could be taking Xarelto 15 mg.  The current 30 day co-pay is $0.00.   The patient is insured through [place] Patient Advocate Specialist [place] Patient Advocate Team Direct Number: [phone]  Fax: [phone]

## 2022-07-16 NOTE — TOC Transition Note (Signed)
Transition of Care [facility]) - CM/SW Discharge Note   Patient Details  Name: Matthew Schmitt MRN: [id number] Date of Birth: 05/21/1951  Transition [facility]) CM/SW Contact:  Bartholomew Crews, RN Phone Number: [phone] 07/16/2022, 9:39 AM   Clinical Narrative:     Spoke with patient at the bedside to discuss post acute transition. Discussed new medication, xarelto, and benefits check found no copay for this medication. Patient would like to use [facility] pharmacy for DC medications stating it would be convenient. Patient has walker at home. Patient can arrange for transportation home. TOC following for transition needs.   Final next level of care: Home/Self Care Barriers to Discharge: No Barriers Identified   Patient Goals and CMS Choice Patient states their goals for this hospitalization and ongoing recovery are:: home CMS Medicare.gov Compare Post Acute Care list provided to:: Patient Choice offered to / list presented to : NA  Discharge Placement                       Discharge Plan and Services                                     Social Determinants of Health (SDOH) Interventions     Readmission Risk Interventions     No data to display

## 2022-07-16 NOTE — Progress Notes (Signed)
Patient having increased pain today.  Continues to participate well with PT.   He's on xarelto 15 mg BID for acute DVT in LLE. Continue PT, CPM Will reassess tomorrow for going home based on pain levels.  Azucena Cecil, MD [facility] 1:19 PM

## 2022-07-16 NOTE — Progress Notes (Signed)
Mobility Specialist Progress Note   07/16/22 1247  Mobility  Activity Ambulated with assistance in hallway  Level of Assistance Contact guard assist, steadying assist  Assistive Device Front wheel walker  LLE Weight Bearing WBAT  Distance Ambulated (ft) 300 ft  Activity Response Tolerated well  $Mobility charge 1 Mobility   Pre Mobility: 77 HR, 97% SpO2 During Mobility: 94 HR Post Mobility: 81 HR, 98% SpO2  Received pt in bed eager to walk but c/o L knee stiffness. Requiring no physical assistance through out and claiming knee to feel better as we progressed gait. Returned back to chair w/o fault. Call bell placed in reach and all needs met.  Holland Falling Mobility Specialist MS [facility] #:  [phone] Acute Rehab Office:  [phone]

## 2022-07-16 NOTE — Telephone Encounter (Signed)
Pharmacy Patient Advocate Encounter  Insurance verification completed.    The patient is insured through [employer]   The patient is currently admitted and ran test claims for the following: Xarelto.  Copays and coinsurance results were relayed to Inpatient clinical team. 

## 2022-07-16 NOTE — Inpatient Diabetes Management (Signed)
Inpatient Diabetes Program Recommendations  AACE/ADA: New Consensus Statement on Inpatient Glycemic Control   Target Ranges:  Prepandial:   less than 140 mg/dL      Peak postprandial:   less than 180 mg/dL (1-2 hours)      Critically ill patients:  140 - 180 mg/dL    Latest Reference Range & Units 07/16/22 06:30 07/16/22 07:47 07/16/22 11:15 07/16/22 12:04  Glucose-Capillary 70 - 99 mg/dL 81 84 56 (L) 139 (H)    Latest Reference Range & Units 07/15/22 08:22 07/15/22 13:00 07/15/22 16:32 07/15/22 20:47  Glucose-Capillary 70 - 99 mg/dL 173 (H) 123 (H) 75 122 (H)   Review of Glycemic Control  Diabetes history: DM2 Outpatient Diabetes medications: Farxiga 10 mg QAM, Basaglar 90 units QAM, Rybelsus 3 mg daily Current orders for Inpatient glycemic control: Lantus 90 units QAM, Novolog 0-15 units TID with meals, Novolog 0-5 units QHS, Farxiga 10 mg QAM  Inpatient Diabetes Program Recommendations:    Insulin: Please decrease Lantus to 75 units QAM.  NOTE: Patient admitted 07/13/22 following knee surgery. Per chart review, noted patient sees Dr. Cruzita Lederer (Endocdrinologist) and was last seen 07/06/22. Per office note on 07/06/22, patient was asked to continue Farxiga 10 mg daily, Basaglar 90 units daily, and was started on Rybelsus 3 mg daily at that time. Patient received Lantus 90 units on 07/15/22 and glucose ranged from 75-173 mg/d. Noted patient has already received Lantus 90 units this morning and glucose down to 56 mg/dl at 11:15 today.   Thanks, Barnie Alderman, RN, MSN, [place] Diabetes Coordinator Inpatient Diabetes Program [phone] (Team Pager from 8am to [place])

## 2022-07-16 NOTE — Plan of Care (Signed)
Problem: Education: Goal: Ability to describe self-care measures that may prevent or decrease complications (Diabetes Survival Skills Education) will improve Outcome: Progressing Goal: Individualized Educational Video(s) Outcome: Progressing   Problem: Coping: Goal: Ability to adjust to condition or change in health will improve Outcome: Progressing   Problem: Fluid Volume: Goal: Ability to maintain a balanced intake and output will improve Outcome: Progressing   Problem: Health Behavior/Discharge Planning: Goal: Ability to identify and utilize available resources and services will improve Outcome: Progressing Goal: Ability to manage health-related needs will improve Outcome: Progressing   Problem: Metabolic: Goal: Ability to maintain appropriate glucose levels will improve Outcome: Progressing   Problem: Nutritional: Goal: Maintenance of adequate nutrition will improve Outcome: Progressing Goal: Progress toward achieving an optimal weight will improve Outcome: Progressing   Problem: Skin Integrity: Goal: Risk for impaired skin integrity will decrease Outcome: Progressing   Problem: Tissue Perfusion: Goal: Adequacy of tissue perfusion will improve Outcome: Progressing   Problem: Education: Goal: Knowledge of the prescribed therapeutic regimen will improve Outcome: Progressing Goal: Individualized Educational Video(s) Outcome: Progressing   Problem: Activity: Goal: Ability to avoid complications of mobility impairment will improve Outcome: Progressing Goal: Range of joint motion will improve Outcome: Progressing   Problem: Clinical Measurements: Goal: Postoperative complications will be avoided or minimized Outcome: Progressing   Problem: Pain Management: Goal: Pain level will decrease with appropriate interventions Outcome: Progressing   Problem: Skin Integrity: Goal: Will show signs of wound healing Outcome: Progressing   Problem: Education: Goal:  Knowledge of General Education information will improve Description: Including pain rating scale, medication(s)/side effects and non-pharmacologic comfort measures Outcome: Progressing   Problem: Health Behavior/Discharge Planning: Goal: Ability to manage health-related needs will improve Outcome: Progressing   Problem: Clinical Measurements: Goal: Ability to maintain clinical measurements within normal limits will improve Outcome: Progressing Goal: Will remain free from infection Outcome: Progressing Goal: Diagnostic test results will improve Outcome: Progressing Goal: Respiratory complications will improve Outcome: Progressing Goal: Cardiovascular complication will be avoided Outcome: Progressing   Problem: Activity: Goal: Risk for activity intolerance will decrease Outcome: Progressing   Problem: Nutrition: Goal: Adequate nutrition will be maintained Outcome: Progressing   Problem: Coping: Goal: Level of anxiety will decrease Outcome: Progressing   Problem: Elimination: Goal: Will not experience complications related to bowel motility Outcome: Progressing Goal: Will not experience complications related to urinary retention Outcome: Progressing   Problem: Pain Managment: Goal: General experience of comfort will improve Outcome: Progressing   Problem: Safety: Goal: Ability to remain free from injury will improve Outcome: Progressing   Problem: Skin Integrity: Goal: Risk for impaired skin integrity will decrease Outcome: Progressing   Problem: Education: Goal: Knowledge of General Education information will improve Description: Including pain rating scale, medication(s)/side effects and non-pharmacologic comfort measures Outcome: Progressing   Problem: Health Behavior/Discharge Planning: Goal: Ability to manage health-related needs will improve Outcome: Progressing   Problem: Clinical Measurements: Goal: Ability to maintain clinical measurements within  normal limits will improve Outcome: Progressing Goal: Will remain free from infection Outcome: Progressing Goal: Diagnostic test results will improve Outcome: Progressing Goal: Respiratory complications will improve Outcome: Progressing Goal: Cardiovascular complication will be avoided Outcome: Progressing   Problem: Activity: Goal: Risk for activity intolerance will decrease Outcome: Progressing   Problem: Nutrition: Goal: Adequate nutrition will be maintained Outcome: Progressing   Problem: Coping: Goal: Level of anxiety will decrease Outcome: Progressing   Problem: Elimination: Goal: Will not experience complications related to bowel motility Outcome: Progressing Goal: Will not experience complications related to  urinary retention Outcome: Progressing

## 2022-07-16 NOTE — Progress Notes (Signed)
Patient is complaining of increase pain in the LLE and the patient is unable to raise his the LLE. Annie Main, [place] was notified. Will continue to monitor patient.

## 2022-07-17 ENCOUNTER — Encounter (HOSPITAL_COMMUNITY): Payer: Self-pay | Admitting: Orthopaedic Surgery

## 2022-07-17 ENCOUNTER — Other Ambulatory Visit (HOSPITAL_COMMUNITY): Payer: Self-pay

## 2022-07-17 ENCOUNTER — Other Ambulatory Visit: Payer: Self-pay | Admitting: Physician Assistant

## 2022-07-17 LAB — GLUCOSE, CAPILLARY: Glucose-Capillary: 130 mg/dL — ABNORMAL HIGH (ref 70–99)

## 2022-07-17 MED ORDER — RIVAROXABAN 15 MG PO TABS
15.0000 mg | ORAL_TABLET | Freq: Two times a day (BID) | ORAL | 0 refills | Status: DC
  Filled 2022-07-17: qty 38, 19d supply, fill #0

## 2022-07-17 MED ORDER — ONDANSETRON HCL 4 MG PO TABS
4.0000 mg | ORAL_TABLET | Freq: Three times a day (TID) | ORAL | 0 refills | Status: DC | PRN
  Filled 2022-07-17: qty 9, 2d supply, fill #0

## 2022-07-17 MED ORDER — METHOCARBAMOL 750 MG PO TABS
750.0000 mg | ORAL_TABLET | Freq: Two times a day (BID) | ORAL | 3 refills | Status: DC | PRN
  Filled 2022-07-17 (×2): qty 30, 15d supply, fill #0

## 2022-07-17 MED ORDER — RIVAROXABAN 15 MG PO TABS
15.0000 mg | ORAL_TABLET | Freq: Two times a day (BID) | ORAL | 0 refills | Status: DC
  Filled 2022-07-17: qty 60, 30d supply, fill #0

## 2022-07-17 MED ORDER — OXYCODONE HCL 5 MG PO TABS
5.0000 mg | ORAL_TABLET | Freq: Three times a day (TID) | ORAL | 0 refills | Status: DC | PRN
  Filled 2022-07-17 (×2): qty 60, 10d supply, fill #0

## 2022-07-17 NOTE — Progress Notes (Signed)
Patient taught education and IV removed, patient awaiting prescriptions through transitional pharmacy

## 2022-07-17 NOTE — Discharge Summary (Signed)
Patient ID: Matthew Schmitt MRN: [id number] DOB/AGE: [birth year] 72 y.o.  Admit date: 07/13/2022 Discharge date: 07/17/2022  Admission Diagnoses:  Principal Problem:   Loose left total knee arthroplasty [facility]) Active Problems:   Status post revision of total replacement of left knee   S/P revision of total knee, left   Discharge Diagnoses:  Same  Past Medical History:  Diagnosis Date   Anxiety    Arthritis    everywhere   Arthrosis of right acromioclavicular joint 01/29/2020   Atrial flutter ([place])    Onset May 2014 Cardioversion done    BPH associated with nocturia    BPH with obstruction/lower urinary tract symptoms 01/04/2017   CAD (coronary artery disease) 04/02/2013   Cath 2012 moderate mid LAD lesion, otherwise not much disease    Diabetes ([place]) 01/18/2018   Dilated aortic root (HCC)    aortic root 44 mm, ascending aorta 46 mm 12/22/21 echo   Encounter for long-term (current) use of other medications 07/27/2013   Essential hypertension, benign    First degree heart block    Heart murmur    mild-moderate AS and AR 01/11/22   History of bilateral knee replacement 02/07/2017   History of bladder stone    History of kidney stones    History of melanoma excision    June 2016  --- s/p  excision right  leg (per pt localized and no recurrence)   Hypertensive heart disease 04/02/2013   Impingement syndrome of right shoulder 01/29/2020   Impotence of organic origin    Left-sided low back pain with left-sided sciatica 01/23/2019   Long term current use of anticoagulant therapy    Mixed hyperlipidemia    slightly   Multiple renal cysts    bilateral   Nephrolithiasis 05/17/2014   Nonischemic cardiomyopathy ([place])    Osteoarthritis    PAF (paroxysmal atrial fibrillation) ([place])    followed by cardiology  (takes ASA)   RBBB    RBBB (right bundle branch block with left anterior fascicular block) 04/02/2013   Renal oncocytoma of left kidney 06/18/2013   s/p left robotic assited  laparoscopic partial nephrectomy 06/18/13   Right ureteral stone    Rotator cuff syndrome of left shoulder 04/17/2016   Status post total left knee replacement 01/01/2020   Tendinopathy of right biceps tendon 01/29/2020   Tendinopathy of right rotator cuff 01/29/2020   Type 2 diabetes mellitus treated with insulin [facility])    endocrinologist-  dr Loanne Drilling   Wears glasses     Surgeries: Procedure(s): LEFT TOTAL KNEE REVISION, TIBIAL COMPONENT on 07/13/2022   Consultants:   Discharged Condition: Improved  Hospital Course: Matthew Schmitt is an 70 y.o. male who was admitted 07/13/2022 for operative treatment ofLoose left total knee arthroplasty ([place]). Patient has severe unremitting pain that affects sleep, daily activities, and work/hobbies. After pre-op clearance the patient was taken to the operating room on 07/13/2022 and underwent  Procedure(s): LEFT TOTAL KNEE REVISION, TIBIAL COMPONENT.  Patient developed acute dvt LLE pod #2.  Currently on xarelto '15mg'$  bid.    Patient was given perioperative antibiotics:  Anti-infectives (From admission, onward)    Start     Dose/Rate Route Frequency Ordered Stop   07/13/22 1900  ceFAZolin (ANCEF) IVPB 2g/100 mL premix        2 g 200 mL/hr over 30 Minutes Intravenous Every 6 hours 07/13/22 1720 07/14/22 0100   07/13/22 1541  vancomycin (VANCOCIN) powder  Status:  Discontinued  As needed 07/13/22 1542 07/13/22 1625   07/13/22 1015  ceFAZolin (ANCEF) IVPB 2g/100 mL premix        2 g 200 mL/hr over 30 Minutes Intravenous On call to O.R. 07/13/22 1011 07/13/22 1253        Patient was given sequential compression devices, early ambulation, and chemoprophylaxis to prevent DVT.  Patient benefited maximally from hospital stay and there were no complications.    Recent vital signs: Patient Vitals for the past 24 hrs:  BP Temp Temp src Pulse Resp SpO2  07/17/22 0349 (!) 147/58 98 F (36.7 C) -- 63 18 97 %  07/16/22 1948 134/88 98.4 F (36.9 C)  -- 76 17 96 %  07/16/22 1430 (!) 141/66 98.2 F (36.8 C) Oral 78 15 98 %  07/16/22 0752 124/63 98.8 F (37.1 C) Oral 77 15 96 %     Recent laboratory studies: No results for input(s): "WBC", "HGB", "HCT", "PLT", "NA", "K", "CL", "CO2", "BUN", "CREATININE", "GLUCOSE", "INR", "CALCIUM" in the last 72 hours.  Invalid input(s): "PT", "2"   Discharge Medications:   Allergies as of 07/17/2022       Reactions   Actos [pioglitazone] Swelling, Cough   SWELLING REACTION UNSPECIFIED  EDEMA        Medication List     TAKE these medications    Basaglar KwikPen 100 UNIT/ML Inject 80 Units into the skin every morning. And pen needles 1/day What changed:  how much to take when to take this   dapagliflozin propanediol 10 MG Tabs tablet Commonly known as: Farxiga Take 1 tablet (10 mg total) by mouth daily before breakfast.   Entresto 49-51 MG Generic drug: sacubitril-valsartan Take 1 tablet by mouth 2 (two) times daily.   methocarbamol 750 MG tablet Commonly known as: ROBAXIN Take 1 tablet (750 mg total) by mouth 2 (two) times daily as needed for muscle spasms.   metoprolol succinate 50 MG 24 hr tablet Commonly known as: TOPROL-XL TAKE 1 TABLET DAILY, TAKE WITH OR IMMEDIATELY FOLLOWING A MEAL   nitroGLYCERIN 0.4 MG SL tablet Commonly known as: NITROSTAT Place 0.4 mg under the tongue every 5 (five) minutes x 3 doses as needed for chest pain.   ondansetron 4 MG tablet Commonly known as: ZOFRAN Take 1-2 tablets (4-8 mg total) by mouth every 8 (eight) hours as needed for nausea or vomiting.   OneTouch Delica Plus [id number] Misc 1 each by Other route 2 (two) times daily. E11.9   OneTouch Ultra test strip Generic drug: glucose blood USE 1 STRIP TWICE A DAY   oxyCODONE 5 MG immediate release tablet Commonly known as: Oxy IR/ROXICODONE Take 1-2 tablets (5-10 mg total) by mouth every 8 (eight) hours as needed for severe pain.   Pen Needles 30G X 8 MM Misc 1 each by Does not  apply route daily. E11.9   Rivaroxaban 15 MG Tabs tablet Commonly known as: XARELTO Take 1 tablet (15 mg total) by mouth 2 (two) times daily. What changed:  medication strength how much to take when to take this   rosuvastatin 20 MG tablet Commonly known as: CRESTOR TAKE 1 TABLET DAILY   Rybelsus 3 MG Tabs Generic drug: Semaglutide Take 3 mg by mouth daily.   spironolactone 25 MG tablet Commonly known as: ALDACTONE TAKE ONE-HALF (1/2) TABLET DAILY   trolamine salicylate 10 % cream Commonly known as: ASPERCREME Apply 1 application  topically 3 (three) times daily as needed for muscle pain.  Durable Medical Equipment  (From admission, onward)           Start     Ordered   07/13/22 1706  DME Walker rolling  Once       Question Answer Comment  Walker: With 5 Inch Wheels   Patient needs a walker to treat with the following condition Status post left partial knee replacement      07/13/22 1705   07/13/22 1706  DME 3 n 1  Once        07/13/22 1705   07/13/22 1706  DME Bedside commode  Once       Question:  Patient needs a bedside commode to treat with the following condition  Answer:  Status post left partial knee replacement   07/13/22 1705            Diagnostic Studies: DG Knee Left Port  Result Date: 07/16/2022 CLINICAL DATA:  Increased pain since Saturday.  Postop EXAM: PORTABLE LEFT KNEE - 1-2 VIEW COMPARISON:  Three days ago FINDINGS: Diminishing postoperative gas. Total knee arthroplasty remains well seated. No acute fracture. IMPRESSION: No acute finding.  Well seated total knee arthroplasty. Electronically Signed   By: Jorje Guild M.D.   On: 07/16/2022 07:55   VAS [place] LOWER EXTREMITY VENOUS (DVT)  Result Date: 07/15/2022  Lower Venous DVT Study Patient Name:  JAVORIS STAR  Date of Exam:   07/15/2022 Medical Rec #: [id number]       Accession #:    [id number] Date of Birth: Jan 24, 1951      Patient Gender: M Patient Age:   67 years Exam  Location:  [facility] Procedure:      VAS [place] LOWER EXTREMITY VENOUS (DVT) Referring Phys: Georga Kaufmann XU --------------------------------------------------------------------------------  Indications: Calf cramping, Swelling, and Status post total knee replacement 07/13/22.  Anticoagulation: Patient was on Xarelto 3 days prior to surgery for Atrial fibrillation. Limitations: Swelling. Comparison Study: No prior left lower extremity venous duplex Performing Technologist: Sharion Dove RVS  Examination Guidelines: A complete evaluation includes B-mode imaging, spectral Doppler, color Doppler, and power Doppler as needed of all accessible portions of each vessel. Bilateral testing is considered an integral part of a complete examination. Limited examinations for reoccurring indications may be performed as noted. The reflux portion of the exam is performed with the patient in reverse Trendelenburg.  +-----+---------------+---------+-----------+----------+--------------+ RIGHTCompressibilityPhasicitySpontaneityPropertiesThrombus Aging +-----+---------------+---------+-----------+----------+--------------+ CFV  Full           Yes      Yes                                 +-----+---------------+---------+-----------+----------+--------------+   +---------+---------------+---------+-----------+----------+--------------+ LEFT     CompressibilityPhasicitySpontaneityPropertiesThrombus Aging +---------+---------------+---------+-----------+----------+--------------+ CFV      Full                                                        +---------+---------------+---------+-----------+----------+--------------+ SFJ      Full                                                        +---------+---------------+---------+-----------+----------+--------------+ FV  Prox  Full                                                         +---------+---------------+---------+-----------+----------+--------------+ FV Mid   Full                                                        +---------+---------------+---------+-----------+----------+--------------+ FV DistalFull                                                        +---------+---------------+---------+-----------+----------+--------------+ PFV      Full                                                        +---------+---------------+---------+-----------+----------+--------------+ POP                     Yes      Yes                                 +---------+---------------+---------+-----------+----------+--------------+ PTV      None                                         Acute          +---------+---------------+---------+-----------+----------+--------------+ PERO     None                                         Acute          +---------+---------------+---------+-----------+----------+--------------+ TP Trunk None                                         Acute          +---------+---------------+---------+-----------+----------+--------------+    Summary: RIGHT: - No evidence of common femoral vein obstruction.  LEFT: - Findings consistent with acute deep vein thrombosis involving the left posterior tibial veins, left peroneal veins, and tibioperoneal trunk. The popliteal vein is difficult to evaluate secondary to the pseudoaneurysm, but it appears patent by color flow and Doppler. There is an incidental finding of a pseudoaneurysm measuring 1.76cm X 2.91 cm in the popliteal fossa with a small neck.  *See table(s) above for measurements and observations. Electronically signed by Harold Barban MD on 07/15/2022 at 8:48:08 PM.    Final    DG Knee Left Port  Result Date: 07/13/2022 CLINICAL DATA:  Postop. EXAM: PORTABLE LEFT KNEE - 1-2 VIEW COMPARISON:  07/02/2022 FINDINGS: Patient has undergone  replacement of knee arthroplasty hardware.  There is no interval fracture. Hardware appears well seated. IMPRESSION: Revision of LEFT knee arthroplasty.  No adverse features. Electronically Signed   By: Nolon Nations M.D.   On: 07/13/2022 16:40   XR Knee 1-2 Views Left  Result Date: 07/02/2022 Status post left total knee.  Tibial component demonstrates loosening and subsidence into varus.     Disposition: Discharge disposition: 01-Home or Self Care          Follow-up Information     Leandrew Koyanagi, MD Follow up in 1 week(s).   Specialty: Orthopedic Surgery Why: For wound re-check Contact information: [address] [place] [address] [phone]                  Signed: Aundra Dubin 07/17/2022, 7:28 AM

## 2022-07-17 NOTE — Plan of Care (Signed)
Problem: Education: Goal: Ability to describe self-care measures that may prevent or decrease complications (Diabetes Survival Skills Education) will improve Outcome: Adequate for Discharge Goal: Individualized Educational Video(s) Outcome: Adequate for Discharge   Problem: Coping: Goal: Ability to adjust to condition or change in health will improve Outcome: Adequate for Discharge   Problem: Fluid Volume: Goal: Ability to maintain a balanced intake and output will improve Outcome: Adequate for Discharge   Problem: Health Behavior/Discharge Planning: Goal: Ability to identify and utilize available resources and services will improve Outcome: Adequate for Discharge Goal: Ability to manage health-related needs will improve Outcome: Adequate for Discharge   Problem: Metabolic: Goal: Ability to maintain appropriate glucose levels will improve Outcome: Adequate for Discharge   Problem: Nutritional: Goal: Maintenance of adequate nutrition will improve Outcome: Adequate for Discharge Goal: Progress toward achieving an optimal weight will improve Outcome: Adequate for Discharge   Problem: Skin Integrity: Goal: Risk for impaired skin integrity will decrease Outcome: Adequate for Discharge   Problem: Tissue Perfusion: Goal: Adequacy of tissue perfusion will improve Outcome: Adequate for Discharge   Problem: Education: Goal: Knowledge of the prescribed therapeutic regimen will improve Outcome: Adequate for Discharge Goal: Individualized Educational Video(s) Outcome: Adequate for Discharge   Problem: Activity: Goal: Ability to avoid complications of mobility impairment will improve Outcome: Adequate for Discharge Goal: Range of joint motion will improve Outcome: Adequate for Discharge   Problem: Clinical Measurements: Goal: Postoperative complications will be avoided or minimized Outcome: Adequate for Discharge   Problem: Pain Management: Goal: Pain level will decrease  with appropriate interventions Outcome: Adequate for Discharge   Problem: Skin Integrity: Goal: Will show signs of wound healing Outcome: Adequate for Discharge   Problem: Education: Goal: Knowledge of General Education information will improve Description: Including pain rating scale, medication(s)/side effects and non-pharmacologic comfort measures Outcome: Adequate for Discharge   Problem: Health Behavior/Discharge Planning: Goal: Ability to manage health-related needs will improve Outcome: Adequate for Discharge   Problem: Clinical Measurements: Goal: Ability to maintain clinical measurements within normal limits will improve Outcome: Adequate for Discharge Goal: Will remain free from infection Outcome: Adequate for Discharge Goal: Diagnostic test results will improve Outcome: Adequate for Discharge Goal: Respiratory complications will improve Outcome: Adequate for Discharge Goal: Cardiovascular complication will be avoided Outcome: Adequate for Discharge   Problem: Activity: Goal: Risk for activity intolerance will decrease Outcome: Adequate for Discharge   Problem: Nutrition: Goal: Adequate nutrition will be maintained Outcome: Adequate for Discharge   Problem: Coping: Goal: Level of anxiety will decrease Outcome: Adequate for Discharge   Problem: Elimination: Goal: Will not experience complications related to bowel motility Outcome: Adequate for Discharge Goal: Will not experience complications related to urinary retention Outcome: Adequate for Discharge   Problem: Pain Managment: Goal: General experience of comfort will improve Outcome: Adequate for Discharge   Problem: Safety: Goal: Ability to remain free from injury will improve Outcome: Adequate for Discharge   Problem: Skin Integrity: Goal: Risk for impaired skin integrity will decrease Outcome: Adequate for Discharge   Problem: Education: Goal: Knowledge of General Education information will  improve Description: Including pain rating scale, medication(s)/side effects and non-pharmacologic comfort measures Outcome: Adequate for Discharge   Problem: Health Behavior/Discharge Planning: Goal: Ability to manage health-related needs will improve Outcome: Adequate for Discharge   Problem: Clinical Measurements: Goal: Ability to maintain clinical measurements within normal limits will improve Outcome: Adequate for Discharge Goal: Will remain free from infection Outcome: Adequate for Discharge Goal: Diagnostic test results will improve Outcome: Adequate   for Discharge Goal: Respiratory complications will improve Outcome: Adequate for Discharge Goal: Cardiovascular complication will be avoided Outcome: Adequate for Discharge   Problem: Activity: Goal: Risk for activity intolerance will decrease Outcome: Adequate for Discharge   Problem: Nutrition: Goal: Adequate nutrition will be maintained Outcome: Adequate for Discharge   Problem: Coping: Goal: Level of anxiety will decrease Outcome: Adequate for Discharge   Problem: Elimination: Goal: Will not experience complications related to bowel motility Outcome: Adequate for Discharge Goal: Will not experience complications related to urinary retention Outcome: Adequate for Discharge   

## 2022-07-17 NOTE — Progress Notes (Signed)
Mobility Specialist Progress Note   07/17/22 1053  Mobility  Activity Ambulated independently in hallway  Level of Assistance Modified independent, requires aide device or extra time  Assistive Device Front wheel walker  LLE Weight Bearing WBAT  Distance Ambulated (ft) 100 ft  Activity Response Tolerated well  $Mobility charge 1 Mobility   Received pt in chair w/o complaint and agreeable. Short bout in hallway w/o complaint or fault. Returned back to chair prepping for d/c home.   Holland Falling Mobility Specialist MS [facility] #:  [phone] Acute Rehab Office:  [phone]

## 2022-07-17 NOTE — Progress Notes (Signed)
Subjective: 4 Days Post-Op Procedure(s) (LRB): LEFT TOTAL KNEE REVISION, TIBIAL COMPONENT (Left) Patient reports pain as mild.    Objective: Vital signs in last 24 hours: Temp:  [98 F (36.7 C)-98.8 F (37.1 C)] 98 F (36.7 C) (08/29 0349) Pulse Rate:  [63-78] 63 (08/29 0349) Resp:  [15-18] 18 (08/29 0349) BP: (124-147)/(58-88) 147/58 (08/29 0349) SpO2:  [96 %-98 %] 97 % (08/29 0349)  Intake/Output from previous day: 08/28 0701 - 08/29 0700 In: 360 [P.O.:360] Out: 2500 [Urine:2500] Intake/Output this shift: No intake/output data recorded.  No results for input(s): "HGB" in the last 72 hours. No results for input(s): "WBC", "RBC", "HCT", "PLT" in the last 72 hours. No results for input(s): "NA", "K", "CL", "CO2", "BUN", "CREATININE", "GLUCOSE", "CALCIUM" in the last 72 hours. No results for input(s): "LABPT", "INR" in the last 72 hours.  Neurologically intact Neurovascular intact Sensation intact distally Intact pulses distally Dorsiflexion/Plantar flexion intact Incision: dressing C/D/I No cellulitis present Compartment soft   Assessment/Plan: 4 Days Post-Op Procedure(s) (LRB): LEFT TOTAL KNEE REVISION, TIBIAL COMPONENT (Left) Up with therapy WBAT LLE Acute DVT LLE- switched to xarelto 15 mg bid Wound vac- please swap out hospital unit for prevena upon discharge today D/c today home with hhpt    Anticipated LOS equal to or greater than 2 midnights due to - Age 65 and older with one or more of the following:  - Obesity  - Expected need for hospital services (PT, OT, Nursing) required for safe  discharge  - Anticipated need for postoperative skilled nursing care or inpatient rehab  - Active co-morbidities: Diabetes, DVT/VTE, and Cardiac Arrhythmia OR   - Unanticipated findings during/Post Surgery: DVT/VTE, Lab abnormalities, and Slow post-op progression: GI, pain control, mobility  - Patient is a high risk of re-admission due to: None   Aundra Dubin 07/17/2022, 7:23 AM

## 2022-07-19 ENCOUNTER — Ambulatory Visit (INDEPENDENT_AMBULATORY_CARE_PROVIDER_SITE_OTHER): Payer: BC Managed Care – PPO | Admitting: Sports Medicine

## 2022-07-19 ENCOUNTER — Encounter: Payer: Self-pay | Admitting: Sports Medicine

## 2022-07-19 DIAGNOSIS — M25562 Pain in left knee: Secondary | ICD-10-CM

## 2022-07-19 DIAGNOSIS — Z96652 Presence of left artificial knee joint: Secondary | ICD-10-CM

## 2022-07-19 DIAGNOSIS — M25462 Effusion, left knee: Secondary | ICD-10-CM

## 2022-07-19 NOTE — Progress Notes (Signed)
Office Visit Note   Patient: Matthew Schmitt           Date of Birth: Mar 02, 1951           MRN: [id number] Visit Date: 07/19/2022              Requested by: Leonides Sake, MD [place],  [place] [id number] PCP: Leonides Sake, MD  Assessment & Plan: Visit Diagnoses:  1. S/P TKR (total knee replacement), left   2. Pain and swelling of left knee    Plan: I discussed with Matthew Schmitt and his son that although his postop pain is significant, I do not see any worrisome findings on exam.  He does have routine postoperative swelling.  The leg and foot is well-perfused.  He does have a known acute DVT that was found in the hospital, however currently on Xarelto 15 mg twice daily for this and has not missed any doses.  He denies any chest pain, shortness of breath, or signs of clot travel.  I do think some of this pain is from being recumbent in his recliner chair somewhat often given the tiredness of his routine Percocet use. Stressed importance of increasing icing, elevation and routine postoperative care.  He will follow-up on Tuesday for his wound VAC removal and his postop appointment this following Friday.  Strict return precautions were provided for the patient, he is agreeable and understanding.  Subjective: CC: Left knee pain s/p TKA  HPI [name] is a pleasant 71 year old male who presents with left knee pain and swelling status post left total knee revision on 07/13/2022 by Dr. Erlinda Hong.  He states during this visit in the hospital they did note that he had an acute DVT of the left lower extremity, he has since been on Xarelto 15 mg twice daily, as opposed to daily, for this reason.  He has been discharged for a few days, and his knee pain has progressed.  He continues on the Percocet about every 6 hours.  This does make him sleepy so he has been resting quite a bit more and not moving the knee as much.  He denies any reddening of the leg.  His pain and swelling is in the anterior and  posterior aspect of the knee and proximal calf.  He denies any shortness of breath, chest pain or exertional CP.  He has not missed any doses of Xarelto.  His wound VAC is in place currently.  He has an appointment on Tuesday to have this removed.  His follow-up appointment is next postop appointment is next Friday.  Objective: Vital Signs:  - HR: 87 - RR: 13  Physical Exam Gen: Well-appearing, in no acute distress; non-toxic CV: Regular Rate. Well-perfused. Warm.  Resp: Breathing unlabored on room air; no wheezing. Psych: Fluid speech in conversation; appropriate affect; normal thought process Neuro: Sensation intact throughout. No gross coordination deficits.   Ortho Exam  - Left knee/LLE: Evaluation of the knee demonstrates a wound VAC in place throughout the anterior knee without much yield of any serosanguineous fluid.  The wound VAC is well adhered to the skin without signs of erythema or drainage.  He does have some generalized swelling of the knee and the proximal calf.  He does have notable hemosiderin deposition on bilateral legs.  There is some superficial hematoma formation over the anterior and posterior proximal to mid calf.  He is able to perform active range of motion from about 5-45 degrees. 2+  dorsalis pedis and posterior tibial pulse.  The foot is well-perfused.  He has no difficulty with dorsiflexion/plantarflexion of the ankle.  Gross sensation intact on the anterior and posterior aspect of the lower extremity.  Negative Homans' sign.

## 2022-07-20 ENCOUNTER — Ambulatory Visit: Payer: BC Managed Care – PPO | Admitting: Internal Medicine

## 2022-07-22 ENCOUNTER — Telehealth: Payer: Self-pay | Admitting: Home Health

## 2022-07-22 NOTE — Telephone Encounter (Signed)
Called after-hours line reporting that he had developed a blood clot in his leg few days after his knee surgery, currently taking a blood thinner.  He feels lacking improvement of his leg pain and swelling.  He wants to know what else can he do.  He is very concerned about his leg pain that is not improving.  Advised patient to continue his blood thinner and communicate with his orthopedic surgeon on this matter.  If his pain is significant without improvement or worsening, consider go to the ER for immediate evaluation of his leg.  Patient agreed.

## 2022-07-24 ENCOUNTER — Telehealth: Payer: Self-pay | Admitting: *Deleted

## 2022-07-24 DIAGNOSIS — I729 Aneurysm of unspecified site: Secondary | ICD-10-CM

## 2022-07-24 NOTE — Telephone Encounter (Signed)
Order placed for referral to VVS for evaluation.  Matthew Pain, MD  Shellia Cleverly, RN Pam,  Please set up vascular consult (VVS) for him. Thanks.  Elta Guadel[place]        Previous Messages    ----- Message -----  From: Serafina Mitchell, MD  Sent: 07/22/2022   4:12 PM EDT  To: Matthew Pain, MD  Subject: RE: Popliteal pseudoaneurysm                   One of [place] should see him, probably needs CTA to better evaluate  ----- Message -----  From: Matthew Pain, MD  Sent: 07/16/2022   1:18 PM EDT  To: Serafina Mitchell, MD  Subject: Popliteal pseudoaneurysm                       [place] Nephew,   Should I do anything about the incidental finding of pseudoaneurysm below?  He just had surgery to fix aseptic tibial component of knee prosthetic.  He is being treated for his DVT with Xarelto.   Thanks  -Mark   LEFT:  - Findings consistent with acute deep vein thrombosis involving the left  posterior tibial veins, left peroneal veins, and tibioperoneal trunk. The  popliteal vein is difficult to evaluate secondary to the pseudoaneurysm,  but it appears patent by color  flow and Doppler.  There is an incidental finding of a pseudoaneurysm measuring 1.76cm X 2.91  cm in the popliteal fossa with a small neck.      *See table(s) above for measurements and observations.   Electronically signed by Harold Barban MD on 07/15/2022 at 8:48:08 PM.

## 2022-07-25 ENCOUNTER — Encounter: Payer: Self-pay | Admitting: Orthopaedic Surgery

## 2022-07-25 ENCOUNTER — Ambulatory Visit (INDEPENDENT_AMBULATORY_CARE_PROVIDER_SITE_OTHER): Payer: BC Managed Care – PPO | Admitting: Orthopaedic Surgery

## 2022-07-25 DIAGNOSIS — T84033A Mechanical loosening of internal left knee prosthetic joint, initial encounter: Secondary | ICD-10-CM

## 2022-07-25 MED ORDER — CEPHALEXIN 500 MG PO CAPS: 500.0000 mg | ORAL_CAPSULE | Freq: Four times a day (QID) | ORAL | 0 refills | Status: AC

## 2022-07-25 NOTE — Progress Notes (Signed)
Post-Op Visit Note   Patient: Matthew Schmitt           Date of Birth: [birth year]           MRN: [id number] Visit Date: 07/25/2022 PCP: Matthew Sake, MD   Assessment & Plan:  Chief Complaint:  Chief Complaint  Patient presents with   Left Knee - Follow-up    Left total knee revision 07/13/2022   Visit Diagnoses:  1. Loosening of prosthesis of left total knee replacement, initial encounter [facility])     Plan: [name] is 12 days status post left knee revision of tibial component on 07/13/2022.  He is doing well overall.  His main problem is swelling.  He was diagnosed with an acute DVT on postoperative day 1.  He is on treatment dose Xarelto.  Examination left knee shows expected postoperative swelling to the entire extremity.  Incision is intact.  He can get 90 degrees of flexion.  Today we painted the incision with Betadine and applied a dry dressing.  I want him to work from home for at least a month and then we can reevaluate.  We will see him back on Friday for suture removal.  I will place referral to VVS for management of the DVT.  We will also place a referral for home health PT.  Follow-Up Instructions: No follow-ups on file.   Orders:  No orders of the defined types were placed in this encounter.  Meds ordered this encounter  Medications   cephALEXin (KEFLEX) 500 MG capsule    Sig: Take 1 capsule (500 mg total) by mouth 4 (four) times daily for 14 days.    Dispense:  56 capsule    Refill:  0    Imaging: No results found.  PMFS History: Patient Active Problem List   Diagnosis Date Noted   Status post revision of total replacement of left knee 07/13/2022   S/P revision of total knee, left 07/13/2022   Hypercoagulable state due to atrial flutter ([place]) 02/20/2022   Preop cardiovascular exam 02/20/2022   Loose left total knee arthroplasty ([place]) 14/97/0263   Chronic systolic heart failure ([place]) 08/18/2021   Aortic stenosis 08/18/2021   Chronic kidney disease,  stage 3a ([place]) 08/18/2021   Dilated aortic root ([place]) 08/18/2021   Bicuspid aortic valve 08/18/2021   AKI (acute kidney injury) ([place]) 12/29/2020   Hypokalemia 12/29/2020   Obstructive sleep apnea 12/29/2020   Thrombocytopenia ([place]) 12/29/2020   Hematuria 12/29/2020   Wears glasses    Type 2 diabetes mellitus treated with insulin ([place])    Right ureteral stone    RBBB    PAF (paroxysmal atrial fibrillation) ([place])    Multiple renal cysts    Impotence of organic origin    History of melanoma excision    History of kidney stones    History of bladder stone    Heart murmur    First degree heart block    Hypertension    BPH associated with nocturia    Arthritis    Anxiety    Tendinopathy of right rotator cuff 01/29/2020   Tendinopathy of right biceps tendon 01/29/2020   Impingement syndrome of right shoulder 01/29/2020   Arthrosis of right acromioclavicular joint 01/29/2020   Chronic right shoulder pain 01/01/2020   Status post total left knee replacement 01/01/2020   Left-sided low back pain with left-sided sciatica 01/23/2019   Poorly controlled type 2 diabetes mellitus with circulatory disorder ([place]) 01/18/2018   History of bilateral  knee replacement 02/07/2017   Primary osteoarthritis of left knee    History of renal cell carcinoma 01/2017   BPH with obstruction/lower urinary tract symptoms 01/04/2017   Rotator cuff syndrome of left shoulder 04/17/2016   Nephrolithiasis 05/17/2014   Encounter for long-term (current) use of other medications 07/27/2013   Long term current use of anticoagulant therapy    CAD (coronary artery disease) 04/02/2013   Hypertensive heart disease 04/02/2013   RBBB (right bundle branch block with left anterior fascicular block) 04/02/2013   Atrial flutter (H[facility])    Nonischemic cardiomyopathy (H[facility])    Obesity (BMI 30-39.9)    History of kidney stones    Osteoarthritis    Hyperlipidemia    Past Medical History:  Diagnosis Date   Anxiety     Arthritis    everywhere   Arthrosis of right acromioclavicular joint 01/29/2020   Atrial flutter [facility])    Onset May 2014 Cardioversion done    BPH associated with nocturia    BPH with obstruction/lower urinary tract symptoms 01/04/2017   CAD (coronary artery disease) 04/02/2013   Cath 2012 moderate mid LAD lesion, otherwise not much disease    Diabetes ([place]) 01/18/2018   Dilated aortic root (H[facility])    aortic root 44 mm, ascending aorta 46 mm 12/22/21 echo   Encounter for long-term (current) use of other medications 07/27/2013   Essential hypertension, benign    First degree heart block    Heart murmur    mild-moderate AS and AR 01/11/22   History of bilateral knee replacement 02/07/2017   History of bladder stone    History of kidney stones    History of melanoma excision    June 2016  --- s/p  excision right  leg (per pt localized and no recurrence)   Hypertensive heart disease 04/02/2013   Impingement syndrome of right shoulder 01/29/2020   Impotence of organic origin    Left-sided low back pain with left-sided sciatica 01/23/2019   Long term current use of anticoagulant therapy    Mixed hyperlipidemia    slightly   Multiple renal cysts    bilateral   Nephrolithiasis 05/17/2014   Nonischemic cardiomyopathy (HCC)    Osteoarthritis    PAF (paroxysmal atrial fibrillation) ([place])    followed by cardiology  (takes ASA)   RBBB    RBBB (right bundle branch block with left anterior fascicular block) 04/02/2013   Renal oncocytoma of left kidney 06/18/2013   s/p left robotic assited laparoscopic partial nephrectomy 06/18/13   Right ureteral stone    Rotator cuff syndrome of left shoulder 04/17/2016   Status post total left knee replacement 01/01/2020   Tendinopathy of right biceps tendon 01/29/2020   Tendinopathy of right rotator cuff 01/29/2020   Type 2 diabetes mellitus treated with insulin [facility])    endocrinologist-  dr Loanne Drilling   Wears glasses     Family History  Problem Relation  Age of Onset   Arthritis Father    Hypertension Father    Diabetes Father    Arthritis Mother    Hypertension Mother    Heart attack Brother 33   Diabetes Brother    Lymphoma Paternal Grandfather    Cancer Maternal Uncle    Cancer Paternal Uncle        type unknown    Past Surgical History:  Procedure Laterality Date   A-FLUTTER ABLATION N/A 10/27/2021   Procedure: A-FLUTTER ABLATION;  Surgeon: Vickie Epley, MD;  Location: [place] [facility];  Service:  Cardiovascular;  Laterality: N/A;   CARDIAC CATHETERIZATION  08-17-2011  DR Frederico Hamman [place]IL[place]   POSSIBLE MODERATE LAD STENOSIS JUST AFTER THE BIFURCATION OF LARGE DIAGONAL BRANCH/ LVEF  40-45%/ MILD GLOBAL HYPODINESIS (CATH DONE FOR ABNORMAL STRESS TEST OF FIXED LATERAL WALL DEFECT & BIFASCICULAR BLAOCK)   CARDIOVERSION N/A 04/02/2013   Procedure: CARDIOVERSION;  Surgeon: Jacolyn Reedy, MD;  Location: [place];  Service: Cardiovascular;  Laterality: N/A;   CARDIOVERSION N/A 12/27/2020   Procedure: CARDIOVERSION;  Surgeon: Skeet Latch, MD;  Location: [place];  Service: Cardiovascular;  Laterality: N/A;   CYSTOSCOPY WITH LITHOLAPAXY N/A 05/18/2015   Procedure: CYSTOSCOPY WITH LITHOLAPAXY;  Surgeon: Rana Snare, MD;  Location: [facility];  Service: Urology;  Laterality: N/A;   CYSTOSCOPY WITH LITHOLAPAXY N/A 01/04/2017   Procedure: CYSTOSCOPY WITH LITHOLAPAXY, Holmium Laser;  Surgeon: Ardis Hughs, MD;  Location[facility];  Service: Urology;  Laterality: N/A;   CYSTOSCOPY WITH RETROGRADE PYELOGRAM, URETEROSCOPY AND STENT PLACEMENT  11/03/2012   Procedure: CYSTOSCOPY WITH RETROGRADE PYELOGRAM, URETEROSCOPY AND STENT PLACEMENT;  Surgeon: Bernestine Amass, MD;  [facility];  Service: Urology;  Laterality: Left;  Cystoscopy/Left Retrograde/Ureteroscopy/Holmium Laser Litho/Double J Stent   CYSTOSCOPY WITH URETEROSCOPY, STONE BASKETRY AND STENT PLACEMENT Right 05/15/2019   Procedure: CYSTOSCOPY  WITH URETEROSCOPY, LASER LITHOTRIPSY STONE BASKETRY AND STENT PLACEMENT, RIGHT RETROGRADE;  Surgeon: Ardis Hughs, MD;  Location: [facility];  Service: Urology;  Laterality: Right;   HOLMIUM LASER APPLICATION  16/96/7893   Procedure: HOLMIUM LASER APPLICATION;  Surgeon: Bernestine Amass, MD;  Location: [facility];  Service: Urology;  Laterality: Left;   HOLMIUM LASER APPLICATION N/A 06/28/1750   Procedure: HOLMIUM LASER APPLICATION;  Surgeon: Rana Snare, MD;  Location: [facility];  Service: Urology;  Laterality: N/A;   KNEE SURGERY Bilateral [place] Bilateral June 2016   20th-- left leg //  1st-- right leg w/ skin graft from right thigh (no lymph node bx)   NEPHROLITHOTOMY Right 05/17/2014   Procedure: NEPHROLITHOTOMY PERCUTANEOUS;  Surgeon: Bernestine Amass, MD;  Location[facility];  Service: Urology;  Laterality: Right;   PILONIDAL CYST EXCISION     ROBOTIC ASSITED PARTIAL NEPHRECTOMY Left 06/18/2013   Procedure: ROBOTIC ASSISTED PARTIAL NEPHRECTOMY;  Surgeon: Dutch Gray, MD;  Location: [facility];  Service: Urology;  Laterality: Left;   ROTATOR CUFF REPAIR Left 07/2016   SHOULDER ARTHROSCOPY WITH ROTATOR CUFF REPAIR AND SUBACROMIAL DECOMPRESSION Right 05/27/2020   Procedure: RIGHT SHOULDER ARTHROSCOPY WITH EXTENSIVE DEBRIDEMENT, DISTAL CLAVICLE EXCISION AND SUBACROMIAL DECOMPRESSION;  Surgeon: Leandrew Koyanagi, MD;  Location: [place];  Service: Orthopedics;  Laterality: Right;   TEE WITH CARDIOVERSION  12/27/2020   TEE WITHOUT CARDIOVERSION N/A 12/27/2020   Procedure: TRANSESOPHAGEAL ECHOCARDIOGRAM (TEE);  Surgeon: Skeet Latch, MD;  Location: [facility];  Service: Cardiovascular;  Laterality: N/A;   TONSILLECTOMY  46  (age 32)   TOTAL KNEE ARTHROPLASTY Left 02/07/2017   Procedure: LEFT TOTAL KNEE ARTHROPLASTY;  Surgeon: Leandrew Koyanagi, MD;  Location: [place];  Service: Orthopedics;  Laterality: Left;    TOTAL KNEE ARTHROPLASTY Right 04/25/2017   Procedure: RIGHT TOTAL KNEE ARTHROPLASTY;  Surgeon: Leandrew Koyanagi, MD;  Location: [place];  Service: Orthopedics;  Laterality: Right;   TOTAL KNEE REVISION Left 07/13/2022   Procedure: LEFT TOTAL KNEE REVISION, TIBIAL COMPONENT;  Surgeon: Leandrew Koyanagi, MD;  Location: [place];  Service: Orthopedics;  Laterality: Left;   TRANSURETHRAL RESECTION OF PROSTATE N/A 01/04/2017   Procedure: TRANSURETHRAL RESECTION OF THE PROSTATE (TURP);  Surgeon: Ardis Hughs, MD;  [facility];  Service: Urology;  Laterality: N/A;   Social History   Occupational History   Occupation: Government social research officer, Psychologist, occupational  Tobacco Use   Smoking status: Former    Packs/day: 1.00    Years: 6.00    Total pack years: 6.00    Types: Cigarettes    Quit date: 10/31/1969    Years since quitting: 52.7   Smokeless tobacco: Never  Vaping Use   Vaping Use: Never used  Substance and Sexual Activity   Alcohol use: Yes    Alcohol/week: 0.0 standard drinks of alcohol    Comment: OCCASIONAL   Drug use: No   Sexual activity: Never

## 2022-07-25 NOTE — Addendum Note (Signed)
Addended by: Lendon Collar on: 07/25/2022 08:41 AM   Modules accepted: Orders

## 2022-07-26 ENCOUNTER — Encounter: Payer: Self-pay | Admitting: Vascular Surgery

## 2022-07-26 ENCOUNTER — Ambulatory Visit: Payer: BC Managed Care – PPO | Admitting: Vascular Surgery

## 2022-07-26 ENCOUNTER — Other Ambulatory Visit (HOSPITAL_COMMUNITY): Payer: Self-pay | Admitting: Vascular Surgery

## 2022-07-26 ENCOUNTER — Other Ambulatory Visit: Payer: Self-pay

## 2022-07-26 ENCOUNTER — Ambulatory Visit (HOSPITAL_COMMUNITY)
Admission: RE | Admit: 2022-07-26 | Discharge: 2022-07-26 | Disposition: A | Discharge status: Home / Self Care | Payer: BC Managed Care – PPO | Source: Ambulatory Visit | Attending: Vascular Surgery | Admitting: Vascular Surgery

## 2022-07-26 VITALS — BP 96/55 | HR 54 | Temp 98.6°F | Resp 20 | Ht 71.0 in | Wt 235.0 lb

## 2022-07-26 DIAGNOSIS — I729 Aneurysm of unspecified site: Secondary | ICD-10-CM

## 2022-07-26 DIAGNOSIS — T81718A Complication of other artery following a procedure, not elsewhere classified, initial encounter: Secondary | ICD-10-CM | POA: Diagnosis not present

## 2022-07-26 DIAGNOSIS — I82402 Acute embolism and thrombosis of unspecified deep veins of left lower extremity: Secondary | ICD-10-CM | POA: Diagnosis not present

## 2022-07-26 NOTE — Progress Notes (Signed)
ASSESSMENT & PLAN   DVT LEFT LEG: This patient developed a DVT in the left leg after total knee revision on the left.  Only the tibial veins were involved with no clot proximal to that.  He has been on Xarelto since 07/15/2022.  I would recommend a full 3 months of Xarelto given that he will be somewhat immobile and also has a history of significant venous insufficiency with CEAP C4 venous disease (hyperpigmentation).  We have had a long discussion about the importance of leg elevation and the proper positioning for this as this will help with thrombolysis.  Should also help with pain and swelling.  I have recommended and ordered a follow-up venous duplex scan in 3 months and I will see him back at that time.  He knows to call sooner if he has problems.  PSEUDOANEURYSM LEFT POPLITEAL ARTERY: An incidental finding on his venous duplex scan on 07/15/2022 was a pseudoaneurysm originating off of the left popliteal artery.  I looked at these images and there was significant flow within the pseudoaneurysm and a very short neck.  I repeated his duplex scan today which shows that the pseudoaneurysm has thrombosed.  I think this is the best scenario given that if this had persisted he would likely require a covered stent across the popliteal artery.  He would not be a good patient for thrombin injection given that the neck was very short.  He would not be a good candidate for surgical approach given his recent redo surgery.  I explained that the lump behind his knee will take some time to absorb but overall this is the best scenario that this has clotted on its own.  REASON FOR CONSULT:    Left lower extremity DVT and pseudoaneurysm of left popliteal artery.  The consult is requested by Dr. Candee Furbish.   HPI:   Matthew Schmitt is a 71 y.o. male who underwent a total knee replacement on the left about 5 years ago.  He developed some loosening of the tibial component and underwent a left total knee revision of  the tibial component on 07/13/2022.  He had some left calf pain postoperatively which prompted a duplex scan which was done on 07/15/2022 which showed evidence of a DVT on the left.  An incidental finding was a pseudoaneurysm originating off of the left popliteal artery.  He is continuing to have some pain and swelling in the left calf.  He has been elevating his legs and using an Ace bandage for compression.  He denies any previous history of DVT.  He has been on Xarelto since 07/15/2022.  He was started on the starter pack.  Prior to this I did not get any history of claudication, rest pain, or nonhealing ulcers.  Past Medical History:  Diagnosis Date   Anxiety    Arthritis    everywhere   Arthrosis of right acromioclavicular joint 01/29/2020   Atrial flutter [facility])    Onset May 2014 Cardioversion done    BPH associated with nocturia    BPH with obstruction/lower urinary tract symptoms 01/04/2017   CAD (coronary artery disease) 04/02/2013   Cath 2012 moderate mid LAD lesion, otherwise not much disease    Diabetes ([place]) 01/18/2018   Dilated aortic root (HCC)    aortic root 44 mm, ascending aorta 46 mm 12/22/21 echo   Encounter for long-term (current) use of other medications 07/27/2013   Essential hypertension, benign    First degree heart block  Heart murmur    mild-moderate AS and AR 01/11/22   History of bilateral knee replacement 02/07/2017   History of bladder stone    History of kidney stones    History of melanoma excision    June 2016  --- s/p  excision right  leg (per pt localized and no recurrence)   Hypertensive heart disease 04/02/2013   Impingement syndrome of right shoulder 01/29/2020   Impotence of organic origin    Left-sided low back pain with left-sided sciatica 01/23/2019   Long term current use of anticoagulant therapy    Mixed hyperlipidemia    slightly   Multiple renal cysts    bilateral   Nephrolithiasis 05/17/2014   Nonischemic cardiomyopathy (HCC)     Osteoarthritis    PAF (paroxysmal atrial fibrillation) ([place])    followed by cardiology  (takes ASA)   RBBB    RBBB (right bundle branch block with left anterior fascicular block) 04/02/2013   Renal oncocytoma of left kidney 06/18/2013   s/p left robotic assited laparoscopic partial nephrectomy 06/18/13   Right ureteral stone    Rotator cuff syndrome of left shoulder 04/17/2016   Status post total left knee replacement 01/01/2020   Tendinopathy of right biceps tendon 01/29/2020   Tendinopathy of right rotator cuff 01/29/2020   Type 2 diabetes mellitus treated with insulin [facility])    endocrinologist-  dr Loanne Drilling   Wears glasses     Family History  Problem Relation Age of Onset   Arthritis Father    Hypertension Father    Diabetes Father    Arthritis Mother    Hypertension Mother    Heart attack Brother 19   Diabetes Brother    Lymphoma Paternal Grandfather    Cancer Maternal Uncle    Cancer Paternal Uncle        type unknown    SOCIAL HISTORY: Social History   Tobacco Use   Smoking status: Former    Packs/day: 1.00    Years: 6.00    Total pack years: 6.00    Types: Cigarettes    Quit date: 10/31/1969    Years since quitting: 52.7   Smokeless tobacco: Never  Substance Use Topics   Alcohol use: Yes    Alcohol/week: 0.0 standard drinks of alcohol    Comment: OCCASIONAL    Allergies  Allergen Reactions   Actos [Pioglitazone] Swelling and Cough    SWELLING REACTION UNSPECIFIED  EDEMA    Current Outpatient Medications  Medication Sig Dispense Refill   cephALEXin (KEFLEX) 500 MG capsule Take 1 capsule (500 mg total) by mouth 4 (four) times daily for 14 days. 56 capsule 0   dapagliflozin propanediol (FARXIGA) 10 MG TABS tablet Take 1 tablet (10 mg total) by mouth daily before breakfast. 90 tablet 3   Insulin Glargine (BASAGLAR KWIKPEN) 100 UNIT/ML Inject 80 Units into the skin every morning. And pen needles 1/day (Patient taking differently: Inject 90 Units into the  skin in the morning. And pen needles 1/day) 90 mL 3   Insulin Pen Needle (PEN NEEDLES) 30G X 8 MM MISC 1 each by Does not apply route daily. E11.9 90 each 0   Lancets (ONETOUCH DELICA PLUS OVZCHY85O) MISC 1 each by Other route 2 (two) times daily. E11.9 200 each 3   methocarbamol (ROBAXIN) 750 MG tablet Take 1 tablet (750 mg total) by mouth 2 (two) times daily as needed for muscle spasms. 30 tablet 3   metoprolol succinate (TOPROL-XL) 50 MG 24 hr tablet TAKE 1  TABLET DAILY, TAKE WITH OR IMMEDIATELY FOLLOWING A MEAL 90 tablet 3   nitroGLYCERIN (NITROSTAT) 0.4 MG SL tablet Place 0.4 mg under the tongue every 5 (five) minutes x 3 doses as needed for chest pain.     ondansetron (ZOFRAN) 4 MG tablet Take 1-2 tablets (4-8 mg total) by mouth every 8 (eight) hours as needed for nausea or vomiting. 20 tablet 0   ONETOUCH ULTRA test strip USE 1 STRIP TWICE A DAY 200 strip 3   oxyCODONE (OXY IR/ROXICODONE) 5 MG immediate release tablet Take 1-2 tablets (5-10 mg total) by mouth every 8 (eight) hours as needed for severe pain. 60 tablet 0   Rivaroxaban (XARELTO) 15 MG TABS tablet Take 1 tablet (15 mg total) by mouth 2 (two) times daily for 19 days. 8/29 to 9/17 - take '15mg'$  by mouth twice daily Then on 9/18 - decrease to previous home dose of '20mg'$  daily 38 tablet 0   rosuvastatin (CRESTOR) 20 MG tablet TAKE 1 TABLET DAILY 90 tablet 3   sacubitril-valsartan (ENTRESTO) 49-51 MG Take 1 tablet by mouth 2 (two) times daily. 180 tablet 3   Semaglutide (RYBELSUS) 3 MG TABS Take 3 mg by mouth daily. 30 tablet 1   spironolactone (ALDACTONE) 25 MG tablet TAKE ONE-HALF (1/2) TABLET DAILY 45 tablet 3   trolamine salicylate (ASPERCREME) 10 % cream Apply 1 application  topically 3 (three) times daily as needed for muscle pain.     No current facility-administered medications for this visit.    REVIEW OF SYSTEMS:  '[X]'$  denotes positive finding, '[ ]'$  denotes negative finding Cardiac  Comments:  Chest pain or chest pressure:     Shortness of breath upon exertion:    Short of breath when lying flat:    Irregular heart rhythm:        Vascular    Pain in calf, thigh, or hip brought on by ambulation:    Pain in feet at night that wakes you up from your sleep:     Blood clot in your veins:    Leg swelling:  x       Pulmonary    Oxygen at home:    Productive cough:     Wheezing:         Neurologic    Sudden weakness in arms or legs:     Sudden numbness in arms or legs:     Sudden onset of difficulty speaking or slurred speech:    Temporary loss of vision in one eye:     Problems with dizziness:         Gastrointestinal    Blood in stool:     Vomited blood:         Genitourinary    Burning when urinating:     Blood in urine:        Psychiatric    Major depression:         Hematologic    Bleeding problems:    Problems with blood clotting too easily:        Skin    Rashes or ulcers:        Constitutional    Fever or chills:    -  PHYSICAL EXAM:   Vitals:   07/26/22 1052  BP: (!) 96/55  Pulse: (!) 54  Resp: 20  Temp: 98.6 F (37 C)  SpO2: 97%  Weight: 235 lb (106.6 kg)  Height: '5\' 11"'$  (1.803 m)   Body mass index is 32.78 kg/m. GENERAL:  The patient is a well-nourished male, in no acute distress. The vital signs are documented above. CARDIAC: There is a regular rate and rhythm.  VASCULAR: I do not detect carotid bruits. He has biphasic dorsalis pedis and posterior tibial signals bilaterally. He has had pigmentation consistent with chronic venous insufficiency. PULMONARY: There is good air exchange bilaterally without wheezing or rales. ABDOMEN: Soft and non-tender with normal pitched bowel sounds.  MUSCULOSKELETAL: There are no major deformities. NEUROLOGIC: No focal weakness or paresthesias are detected. SKIN: There are no ulcers or rashes noted. PSYCHIATRIC: The patient has a normal affect.  DATA:    VENOUS DUPLEX: I reviewed the venous duplex scan and the images from the  study done on 07/15/2022.  This patient had acute DVT involving the left posterior tibial veins, left peroneal veins, and tibial peroneal trunk.  An incidental finding was a pseudoaneurysm that was patent and had color flow.  Measured 1.76 x 2.9 cm.  The neck was very short.  LIMITED ARTERIAL DUPLEX: We did a limited arterial duplex scan today as it had been 10 days from the last study and this shows that the pseudoaneurysm is thrombosed.  It measures 2.8 x 2.9 cm.  Deitra Mayo [facility]

## 2022-07-27 ENCOUNTER — Ambulatory Visit (INDEPENDENT_AMBULATORY_CARE_PROVIDER_SITE_OTHER): Payer: BC Managed Care – PPO | Admitting: Orthopaedic Surgery

## 2022-07-27 ENCOUNTER — Encounter: Payer: Self-pay | Admitting: Orthopaedic Surgery

## 2022-07-27 ENCOUNTER — Ambulatory Visit (INDEPENDENT_AMBULATORY_CARE_PROVIDER_SITE_OTHER): Payer: BLUE CROSS/BLUE SHIELD

## 2022-07-27 DIAGNOSIS — Z96652 Presence of left artificial knee joint: Secondary | ICD-10-CM

## 2022-07-27 DIAGNOSIS — Z471 Aftercare following joint replacement surgery: Secondary | ICD-10-CM

## 2022-07-27 MED ORDER — OXYCODONE HCL 5 MG PO TABS: 5.0000 mg | ORAL_TABLET | Freq: Three times a day (TID) | ORAL | 0 refills | Status: DC | PRN

## 2022-07-27 NOTE — Progress Notes (Signed)
Post-Op Visit Note   Patient: Matthew Schmitt           Date of Birth: 10-Aug-1951           MRN: [id number] Visit Date: 07/27/2022 PCP: Leonides Sake, MD Having can see you anyway  Assessment & Plan:  Chief Complaint:  Chief Complaint  Patient presents with   Left Knee - Routine Post Op    left knee revision of tibial component on 07/13/2022   Visit Diagnoses:  1. S/P TKR (total knee replacement), left     Plan: Charls is 2 weeks status post revision of the tibial component on 07/13/2022.  Overall he is doing well.  He is here for suture removal.  Saw Dr. Doren Custard at [facility] yesterday for follow-up recommendations for acute DVT.  No surgical intervention is indicated.  He will be treated with Xarelto.  Examination left knee shows a healed surgical incision.  Expected postoperative swelling.  Range of motion is acceptable.  No signs of infection.  X-rays show stable tibial component without any complications.  Sutures removed Steri-Strips applied.  He will continue with home exercises.  Home health PT referral has been placed.  He states he has no room for CPM at home.  Oxycodone refilled today.  Recheck in 4 weeks with two-view x-rays left knee.  Follow-Up Instructions: Return in about 4 weeks (around 08/24/2022).   Orders:  Orders Placed This Encounter  Procedures   XR KNEE 3 VIEW LEFT   Meds ordered this encounter  Medications   oxyCODONE (OXY IR/ROXICODONE) 5 MG immediate release tablet    Sig: Take 1 tablet (5 mg total) by mouth every 8 (eight) hours as needed for severe pain.    Dispense:  60 tablet    Refill:  0    Imaging: XR KNEE 3 VIEW LEFT  Result Date: 07/27/2022 Stable implants without any complications.  VAS [place] LOWER EXTREMITY ARTERIAL DUPLEX  Result Date: 07/26/2022 LOWER EXTREMITY ARTERIAL DUPLEX STUDY Patient Name:  Matthew Schmitt  Date of Exam:   07/26/2022 Medical Rec #: [id number]       Accession #:    [id number] Date of Birth: [birth year]      Patient Gender:  M Patient Age:   71 years Exam [facility] Procedure:      VAS [place] LOWER EXTREMITY ARTERIAL DUPLEX Referring Phys: Harrell Gave DICKSON --------------------------------------------------------------------------------  Other Factors: Left total knee 07/13/2022.  Current ABI: not performed Comparison Study: L LE DVT 07/15/2022                   Acute PTV, peroneal vein and TP trunk with incidental finding                   of popliteal pseudoaneurysm measuring 1.76 cm x 2.91 cm. Performing Technologist: Ronal Fear RVS, RCS  Examination Guidelines: A complete evaluation includes B-mode imaging, spectral Doppler, color Doppler, and power Doppler as needed of all accessible portions of each vessel. Bilateral testing is considered an integral part of a complete examination. Limited examinations for reoccurring indications may be performed as noted.    Summary: Left: The left popliteal pseudoaneurysm measuring 2.84 x 2.91 cm appears thrombosed. No color or spectral Doppler flow noted within the pseudoaneurysm.  See table(s) above for measurements and observations. Electronically signed by Deitra Mayo MD on 07/26/2022 at 10:59:44 AM.    Final     PMFS History: Patient Active Problem List  Diagnosis Date Noted   Status post revision of total replacement of left knee 07/13/2022   S/P revision of total knee, left 07/13/2022   Hypercoagulable state due to atrial flutter ([place]) 02/20/2022   Preop cardiovascular exam 02/20/2022   Loose left total knee arthroplasty (HCC) 57/32/2025   Chronic systolic heart failure ([place]) 08/18/2021   Aortic stenosis 08/18/2021   Chronic kidney disease, stage 3a ([place]) 08/18/2021   Dilated aortic root ([place]) 08/18/2021   Bicuspid aortic valve 08/18/2021   AKI (acute kidney injury) ([place]) 12/29/2020   Hypokalemia 12/29/2020   Obstructive sleep apnea 12/29/2020   Thrombocytopenia ([place]) 12/29/2020   Hematuria 12/29/2020   Wears glasses    Type 2  diabetes mellitus treated with insulin ([place])    Right ureteral stone    RBBB    PAF (paroxysmal atrial fibrillation) ([place])    Multiple renal cysts    Impotence of organic origin    History of melanoma excision    History of kidney stones    History of bladder stone    Heart murmur    First degree heart block    Hypertension    BPH associated with nocturia    Arthritis    Anxiety    Tendinopathy of right rotator cuff 01/29/2020   Tendinopathy of right biceps tendon 01/29/2020   Impingement syndrome of right shoulder 01/29/2020   Arthrosis of right acromioclavicular joint 01/29/2020   Chronic right shoulder pain 01/01/2020   Status post total left knee replacement 01/01/2020   Left-sided low back pain with left-sided sciatica 01/23/2019   Poorly controlled type 2 diabetes mellitus with circulatory disorder ([place]) 01/18/2018   History of bilateral knee replacement 02/07/2017   Primary osteoarthritis of left knee    History of renal cell carcinoma 01/2017   BPH with obstruction/lower urinary tract symptoms 01/04/2017   Rotator cuff syndrome of left shoulder 04/17/2016   Nephrolithiasis 05/17/2014   Encounter for long-term (current) use of other medications 07/27/2013   Long term current use of anticoagulant therapy    CAD (coronary artery disease) 04/02/2013   Hypertensive heart disease 04/02/2013   RBBB (right bundle branch block with left anterior fascicular block) 04/02/2013   Atrial flutter (HCC)    Nonischemic cardiomyopathy (HCC)    Obesity (BMI 30-39.9)    History of kidney stones    Osteoarthritis    Hyperlipidemia    Past Medical History:  Diagnosis Date   Anxiety    Arthritis    everywhere   Arthrosis of right acromioclavicular joint 01/29/2020   Atrial flutter [facility])    Onset May 2014 Cardioversion done    BPH associated with nocturia    BPH with obstruction/lower urinary tract symptoms 01/04/2017   CAD (coronary artery disease) 04/02/2013   Cath 2012 moderate  mid LAD lesion, otherwise not much disease    Diabetes ([place]) 01/18/2018   Dilated aortic root (HCC)    aortic root 44 mm, ascending aorta 46 mm 12/22/21 echo   Encounter for long-term (current) use of other medications 07/27/2013   Essential hypertension, benign    First degree heart block    Heart murmur    mild-moderate AS and AR 01/11/22   History of bilateral knee replacement 02/07/2017   History of bladder stone    History of kidney stones    History of melanoma excision    June 2016  --- s/p  excision right  leg (per pt localized and no recurrence)   Hypertensive heart disease 04/02/2013  Impingement syndrome of right shoulder 01/29/2020   Impotence of organic origin    Left-sided low back pain with left-sided sciatica 01/23/2019   Long term current use of anticoagulant therapy    Mixed hyperlipidemia    slightly   Multiple renal cysts    bilateral   Nephrolithiasis 05/17/2014   Nonischemic cardiomyopathy (HCC)    Osteoarthritis    PAF (paroxysmal atrial fibrillation) ([place])    followed by cardiology  (takes ASA)   RBBB    RBBB (right bundle branch block with left anterior fascicular block) 04/02/2013   Renal oncocytoma of left kidney 06/18/2013   s/p left robotic assited laparoscopic partial nephrectomy 06/18/13   Right ureteral stone    Rotator cuff syndrome of left shoulder 04/17/2016   Status post total left knee replacement 01/01/2020   Tendinopathy of right biceps tendon 01/29/2020   Tendinopathy of right rotator cuff 01/29/2020   Type 2 diabetes mellitus treated with insulin [facility])    endocrinologist-  dr Loanne Drilling   Wears glasses     Family History  Problem Relation Age of Onset   Arthritis Father    Hypertension Father    Diabetes Father    Arthritis Mother    Hypertension Mother    Heart attack Brother 70   Diabetes Brother    Lymphoma Paternal Grandfather    Cancer Maternal Uncle    Cancer Paternal Uncle        type unknown    Past Surgical History:   Procedure Laterality Date   A-FLUTTER ABLATION N/A 10/27/2021   Procedure: A-FLUTTER ABLATION;  Surgeon: Vickie Epley, MD;  Location: [place] [facility];  Service: Cardiovascular;  Laterality: N/A;   CARDIAC CATHETERIZATION  08-17-2011  DR Frederico Hamman [facility]IL[place]   POSSIBLE MODERATE LAD STENOSIS JUST AFTER THE BIFURCATION OF LARGE DIAGONAL BRANCH/ LVEF  40-45%/ MILD GLOBAL HYPODINESIS (CATH DONE FOR ABNORMAL STRESS TEST OF FIXED LATERAL WALL DEFECT & BIFASCICULAR BLAOCK)   CARDIOVERSION N/A 04/02/2013   Procedure: CARDIOVERSION;  Surgeon: Jacolyn Reedy, MD;  Location: [place];  Service: Cardiovascular;  Laterality: N/A;   CARDIOVERSION N/A 12/27/2020   Procedure: CARDIOVERSION;  Surgeon: Skeet Latch, MD;  Location: [place];  Service: Cardiovascular;  Laterality: N/A;   CYSTOSCOPY WITH LITHOLAPAXY N/A 05/18/2015   Procedure: CYSTOSCOPY WITH LITHOLAPAXY;  Surgeon: Rana Snare, MD;  Location: [facility];  Service: Urology;  Laterality: N/A;   CYSTOSCOPY WITH LITHOLAPAXY N/A 01/04/2017   Procedure: CYSTOSCOPY WITH LITHOLAPAXY, Holmium Laser;  Surgeon: Ardis Hughs, MD;  Location: [facility];  Service: Urology;  Laterality: N/A;   CYSTOSCOPY WITH RETROGRADE PYELOGRAM, URETEROSCOPY AND STENT PLACEMENT  11/03/2012   Procedure: CYSTOSCOPY WITH RETROGRADE PYELOGRAM, URETEROSCOPY AND STENT PLACEMENT;  Surgeon: Bernestine Amass, MD;  Location: [facility];  Service: Urology;  Laterality: Left;  Cystoscopy/Left Retrograde/Ureteroscopy/Holmium Laser Litho/Double J Stent   CYSTOSCOPY WITH URETEROSCOPY, STONE BASKETRY AND STENT PLACEMENT Right 05/15/2019   Procedure: CYSTOSCOPY WITH URETEROSCOPY, LASER LITHOTRIPSY STONE BASKETRY AND STENT PLACEMENT, RIGHT RETROGRADE;  Surgeon: Ardis Hughs, MD;  Location[facility];  Service: Urology;  Laterality: Right;   HOLMIUM LASER APPLICATION  02/58/5277   Procedure: HOLMIUM LASER APPLICATION;  Surgeon: Bernestine Amass, MD;  Location[facility];  Service: Urology;  Laterality: Left;   HOLMIUM LASER APPLICATION N/A 07/12/2352   Procedure: HOLMIUM LASER APPLICATION;  Surgeon: Rana Snare, MD;  Location: [facility];  Service: Urology;  Laterality: N/A;  KNEE SURGERY Bilateral 1978  &  1974   MELANOMA EXCISION Bilateral June 2016   20th-- left leg //  1st-- right leg w/ skin graft from right thigh (no lymph node bx)   NEPHROLITHOTOMY Right 05/17/2014   Procedure: NEPHROLITHOTOMY PERCUTANEOUS;  Surgeon: Bernestine Amass, MD;  Location[facility];  Service: Urology;  Laterality: Right;   PILONIDAL CYST EXCISION     ROBOTIC ASSITED PARTIAL NEPHRECTOMY Left 06/18/2013   Procedure: ROBOTIC ASSISTED PARTIAL NEPHRECTOMY;  Surgeon: Dutch Gray, MD;  Location[facility];  Service: Urology;  Laterality: Left;   ROTATOR CUFF REPAIR Left 07/2016   SHOULDER ARTHROSCOPY WITH ROTATOR CUFF REPAIR AND SUBACROMIAL DECOMPRESSION Right 05/27/2020   Procedure: RIGHT SHOULDER ARTHROSCOPY WITH EXTENSIVE DEBRIDEMENT, DISTAL CLAVICLE EXCISION AND SUBACROMIAL DECOMPRESSION;  Surgeon: Leandrew Koyanagi, MD;  Location: [place];  Service: Orthopedics;  Laterality: Right;   TEE WITH CARDIOVERSION  12/27/2020   TEE WITHOUT CARDIOVERSION N/A 12/27/2020   Procedure: TRANSESOPHAGEAL ECHOCARDIOGRAM (TEE);  Surgeon: Skeet Latch, MD;  Location: [facility];  Service: Cardiovascular;  Laterality: N/A;   TONSILLECTOMY  90  (age 26)   TOTAL KNEE ARTHROPLASTY Left 02/07/2017   Procedure: LEFT TOTAL KNEE ARTHROPLASTY;  Surgeon: Leandrew Koyanagi, MD;  Location: [place];  Service: Orthopedics;  Laterality: Left;   TOTAL KNEE ARTHROPLASTY Right 04/25/2017   Procedure: RIGHT TOTAL KNEE ARTHROPLASTY;  Surgeon: Leandrew Koyanagi, MD;  Location: [place];  Service: Orthopedics;  Laterality: Right;   TOTAL KNEE REVISION Left 07/13/2022   Procedure: LEFT TOTAL KNEE REVISION, TIBIAL COMPONENT;  Surgeon: Leandrew Koyanagi, MD;   Location: [place];  Service: Orthopedics;  Laterality: Left;   TRANSURETHRAL RESECTION OF PROSTATE N/A 01/04/2017   Procedure: TRANSURETHRAL RESECTION OF THE PROSTATE (TURP);  Surgeon: Ardis Hughs, MD;  Location[facility];  Service: Urology;  Laterality: N/A;   Social History   Occupational History   Occupation: Government social research officer, Psychologist, occupational  Tobacco Use   Smoking status: Former    Packs/day: 1.00    Years: 6.00    Total pack years: 6.00    Types: Cigarettes    Quit date: 10/31/1969    Years since quitting: 52.7   Smokeless tobacco: Never  Vaping Use   Vaping Use: Never used  Substance and Sexual Activity   Alcohol use: Yes    Alcohol/week: 0.0 standard drinks of alcohol    Comment: OCCASIONAL   Drug use: No   Sexual activity: Never

## 2022-07-30 ENCOUNTER — Ambulatory Visit: Payer: Self-pay | Admitting: Licensed Clinical Social Worker

## 2022-07-30 NOTE — Patient Outreach (Signed)
  Care Coordination   07/30/2022 Name: Matthew Schmitt MRN: [id number] DOB: Apr 05, 1951   Care Coordination Outreach Attempts:  An unsuccessful telephone outreach was attempted today to offer the patient information about available care coordination services as a benefit of their health plan.   Follow Up Plan:  Additional outreach attempts will be made to offer the patient care coordination information and services.   Encounter Outcome:  No Answer  Care Coordination Interventions Activated:  No   Care Coordination Interventions:  No, not indicated    Milus Height, BSW,MSW  Social Worker [facility]  [phone]

## 2022-08-21 ENCOUNTER — Telehealth: Payer: Self-pay

## 2022-08-21 NOTE — Telephone Encounter (Signed)
**Note De-Identified Matthew Schmitt Obfuscation** Letter received from [employer] stating that they have approved the pts Farxiga for coverage until 08/15/23. Case ID: [id number]  The letter states that they notified the pt of this approval on 9/26.

## 2022-08-23 ENCOUNTER — Telehealth: Payer: Self-pay

## 2022-08-23 NOTE — Patient Outreach (Signed)
  Care Coordination   08/23/2022 Name: Matthew Schmitt MRN: [id number] DOB: [birth year]   Care Coordination Outreach Attempts:  An unsuccessful telephone outreach was attempted today to offer the patient information about available care coordination services as a benefit of their health plan.   Follow Up Plan:  Additional outreach attempts will be made to offer the patient care coordination information and services.   Encounter Outcome:  No Answer  Care Coordination Interventions Activated:  No   Care Coordination Interventions:  No, not indicated    Tomasa Rand, RN, BSN, CEN [facility] [employer] [phone]

## 2022-08-24 ENCOUNTER — Encounter: Payer: Self-pay | Admitting: Orthopaedic Surgery

## 2022-08-24 ENCOUNTER — Ambulatory Visit (INDEPENDENT_AMBULATORY_CARE_PROVIDER_SITE_OTHER): Payer: BC Managed Care – PPO

## 2022-08-24 ENCOUNTER — Ambulatory Visit (INDEPENDENT_AMBULATORY_CARE_PROVIDER_SITE_OTHER): Payer: BC Managed Care – PPO | Admitting: Orthopaedic Surgery

## 2022-08-24 DIAGNOSIS — T84033A Mechanical loosening of internal left knee prosthetic joint, initial encounter: Secondary | ICD-10-CM

## 2022-08-24 NOTE — Progress Notes (Addendum)
Post-Op Visit Note   Patient: Matthew Schmitt           Date of Birth: 01/03/1951           MRN: [id number] Visit Date: 08/24/2022 PCP: Leonides Sake, MD   Assessment & Plan:  Chief Complaint:  Chief Complaint  Patient presents with   Left Knee - Follow-up    Left total knee revision 07/13/2022   Visit Diagnoses:  1. Loosening of prosthesis of left total knee replacement, initial encounter [facility])     Plan: Dequane is 6 weeks status post left total knee revision of tibial component for loosening.  He is improving overall.  His main issue is swelling.  He is doing home health PT.  He has been back to work for 2 weeks.  He is able to get 95 degrees of flexion during PT sessions.  His range of motion is mainly limited by the swelling.  He does do quite a bit of walking.  His calf pain is much better.  He does notice that compression to the leg helps.  Examination of the left lower extremity shows a fully healed surgical scar.  Expected postoperative swelling throughout the extremity.  He has pitting edema.  No signs of infection.  Range of motion is adequate.  X-rays show stable implant without any complications.  From my standpoint Charon is slowly improving.  He will continue to do home health PT and home exercises.  I did encourage compression socks.  He is on Xarelto for his DVT.  Recheck in 6 weeks with two-view x-rays of the left knee.  Follow-Up Instructions: Return in about 6 weeks (around 10/05/2022).   Orders:  Orders Placed This Encounter  Procedures   XR Knee 1-2 Views Left   No orders of the defined types were placed in this encounter.   Imaging: XR Knee 1-2 Views Left  Result Date: 08/24/2022 X-rays demonstrate stable left tibial component with revision stem.  No complications.   PMFS History: Patient Active Problem List   Diagnosis Date Noted   Status post revision of total replacement of left knee 07/13/2022   S/P revision of total knee, left 07/13/2022    Hypercoagulable state due to atrial flutter ([place]) 02/20/2022   Preop cardiovascular exam 02/20/2022   Loose left total knee arthroplasty ([place]) 95/07/3266   Chronic systolic heart failure ([place]) 08/18/2021   Aortic stenosis 08/18/2021   Chronic kidney disease, stage 3a ([place]) 08/18/2021   Dilated aortic root ([place]) 08/18/2021   Bicuspid aortic valve 08/18/2021   AKI (acute kidney injury) ([place]) 12/29/2020   Hypokalemia 12/29/2020   Obstructive sleep apnea 12/29/2020   Thrombocytopenia ([place]) 12/29/2020   Hematuria 12/29/2020   Wears glasses    Type 2 diabetes mellitus treated with insulin ([place])    Right ureteral stone    RBBB    PAF (paroxysmal atrial fibrillation) ([place])    Multiple renal cysts    Impotence of organic origin    History of melanoma excision    History of kidney stones    History of bladder stone    Heart murmur    First degree heart block    Hypertension    BPH associated with nocturia    Arthritis    Anxiety    Tendinopathy of right rotator cuff 01/29/2020   Tendinopathy of right biceps tendon 01/29/2020   Impingement syndrome of right shoulder 01/29/2020   Arthrosis of right acromioclavicular joint 01/29/2020   Chronic right shoulder  pain 01/01/2020   Status post total left knee replacement 01/01/2020   Left-sided low back pain with left-sided sciatica 01/23/2019   Poorly controlled type 2 diabetes mellitus with circulatory disorder ([place]) 01/18/2018   History of bilateral knee replacement 02/07/2017   Primary osteoarthritis of left knee    History of renal cell carcinoma 01/2017   BPH with obstruction/lower urinary tract symptoms 01/04/2017   Rotator cuff syndrome of left shoulder 04/17/2016   Nephrolithiasis 05/17/2014   Encounter for long-term (current) use of other medications 07/27/2013   Long term current use of anticoagulant therapy    CAD (coronary artery disease) 04/02/2013   Hypertensive heart disease 04/02/2013   RBBB (right bundle branch block  with left anterior fascicular block) 04/02/2013   Atrial flutter (HCC)    Nonischemic cardiomyopathy (HCC)    Obesity (BMI 30-39.9)    History of kidney stones    Osteoarthritis    Hyperlipidemia    Past Medical History:  Diagnosis Date   Anxiety    Arthritis    everywhere   Arthrosis of right acromioclavicular joint 01/29/2020   Atrial flutter [facility])    Onset May 2014 Cardioversion done    BPH associated with nocturia    BPH with obstruction/lower urinary tract symptoms 01/04/2017   CAD (coronary artery disease) 04/02/2013   Cath 2012 moderate mid LAD lesion, otherwise not much disease    Diabetes ([place]) 01/18/2018   Dilated aortic root (HCC)    aortic root 44 mm, ascending aorta 46 mm 12/22/21 echo   Encounter for long-term (current) use of other medications 07/27/2013   Essential hypertension, benign    First degree heart block    Heart murmur    mild-moderate AS and AR 01/11/22   History of bilateral knee replacement 02/07/2017   History of bladder stone    History of kidney stones    History of melanoma excision    June 2016  --- s/p  excision right  leg (per pt localized and no recurrence)   Hypertensive heart disease 04/02/2013   Impingement syndrome of right shoulder 01/29/2020   Impotence of organic origin    Left-sided low back pain with left-sided sciatica 01/23/2019   Long term current use of anticoagulant therapy    Mixed hyperlipidemia    slightly   Multiple renal cysts    bilateral   Nephrolithiasis 05/17/2014   Nonischemic cardiomyopathy (HCC)    Osteoarthritis    PAF (paroxysmal atrial fibrillation) ([place])    followed by cardiology  (takes ASA)   RBBB    RBBB (right bundle branch block with left anterior fascicular block) 04/02/2013   Renal oncocytoma of left kidney 06/18/2013   s/p left robotic assited laparoscopic partial nephrectomy 06/18/13   Right ureteral stone    Rotator cuff syndrome of left shoulder 04/17/2016   Status post total left knee  replacement 01/01/2020   Tendinopathy of right biceps tendon 01/29/2020   Tendinopathy of right rotator cuff 01/29/2020   Type 2 diabetes mellitus treated with insulin [facility])    endocrinologist-  dr Loanne Drilling   Wears glasses     Family History  Problem Relation Age of Onset   Arthritis Father    Hypertension Father    Diabetes Father    Arthritis Mother    Hypertension Mother    Heart attack Brother 8   Diabetes Brother    Lymphoma Paternal Grandfather    Cancer Maternal Uncle    Cancer Paternal Uncle  type unknown    Past Surgical History:  Procedure Laterality Date   A-FLUTTER ABLATION N/A 10/27/2021   Procedure: A-FLUTTER ABLATION;  Surgeon: Vickie Epley, MD;  Location: [place] [facility];  Service: Cardiovascular;  Laterality: N/A;   CARDIAC CATHETERIZATION  08-17-2011  DR Frederico Hamman [place]   POSSIBLE MODERATE LAD STENOSIS JUST AFTER THE BIFURCATION OF LARGE DIAGONAL BRANCH/ LVEF  40-45%/ MILD GLOBAL HYPODINESIS (CATH DONE FOR ABNORMAL STRESS TEST OF FIXED LATERAL WALL DEFECT & BIFASCICULAR BLAOCK)   CARDIOVERSION N/A 04/02/2013   Procedure: CARDIOVERSION;  Surgeon: Jacolyn Reedy, MD;  Location: [place];  Service: Cardiovascular;  Laterality: N/A;   CARDIOVERSION N/A 12/27/2020   Procedure: CARDIOVERSION;  Surgeon: Skeet Latch, MD;  Location: [place];  Service: Cardiovascular;  Laterality: N/A;   CYSTOSCOPY WITH LITHOLAPAXY N/A 05/18/2015   Procedure: CYSTOSCOPY WITH LITHOLAPAXY;  Surgeon: Rana Snare, MD;  Location: [facility];  Service: Urology;  Laterality: N/A;   CYSTOSCOPY WITH LITHOLAPAXY N/A 01/04/2017   Procedure: CYSTOSCOPY WITH LITHOLAPAXY, Holmium Laser;  Surgeon: Ardis Hughs, MD;  Location: [facility];  Service: Urology;  Laterality: N/A;   CYSTOSCOPY WITH RETROGRADE PYELOGRAM, URETEROSCOPY AND STENT PLACEMENT  11/03/2012   Procedure: CYSTOSCOPY WITH RETROGRADE PYELOGRAM, URETEROSCOPY AND STENT PLACEMENT;  Surgeon: Bernestine Amass,  MD;  Location: [facility];  Service: Urology;  Laterality: Left;  Cystoscopy/Left Retrograde/Ureteroscopy/Holmium Laser Litho/Double J Stent   CYSTOSCOPY WITH URETEROSCOPY, STONE BASKETRY AND STENT PLACEMENT Right 05/15/2019   Procedure: CYSTOSCOPY WITH URETEROSCOPY, LASER LITHOTRIPSY STONE BASKETRY AND STENT PLACEMENT, RIGHT RETROGRADE;  Surgeon: Ardis Hughs, MD;  Location: [facility];  Service: Urology;  Laterality: Right;   HOLMIUM LASER APPLICATION  62/01/5596   Procedure: HOLMIUM LASER APPLICATION;  Surgeon: Bernestine Amass, MD;  Location: [facility];  Service: Urology;  Laterality: Left;   HOLMIUM LASER APPLICATION N/A 03/04/3844   Procedure: HOLMIUM LASER APPLICATION;  Surgeon: Rana Snare, MD;  Location[facility];  Service: Urology;  Laterality: N/A;   KNEE SURGERY Bilateral [place] Bilateral June 2016   20th-- left leg //  1st-- right leg w/ skin graft from right thigh (no lymph node bx)   NEPHROLITHOTOMY Right 05/17/2014   Procedure: NEPHROLITHOTOMY PERCUTANEOUS;  Surgeon: Bernestine Amass, MD;  Location[facility];  Service: Urology;  Laterality: Right;   PILONIDAL CYST EXCISION     ROBOTIC ASSITED PARTIAL NEPHRECTOMY Left 06/18/2013   Procedure: ROBOTIC ASSISTED PARTIAL NEPHRECTOMY;  Surgeon: Dutch Gray, MD;  Location[facility];  Service: Urology;  Laterality: Left;   ROTATOR CUFF REPAIR Left 07/2016   SHOULDER ARTHROSCOPY WITH ROTATOR CUFF REPAIR AND SUBACROMIAL DECOMPRESSION Right 05/27/2020   Procedure: RIGHT SHOULDER ARTHROSCOPY WITH EXTENSIVE DEBRIDEMENT, DISTAL CLAVICLE EXCISION AND SUBACROMIAL DECOMPRESSION;  Surgeon: Leandrew Koyanagi, MD;  Location: [place];  Service: Orthopedics;  Laterality: Right;   TEE WITH CARDIOVERSION  12/27/2020   TEE WITHOUT CARDIOVERSION N/A 12/27/2020   Procedure: TRANSESOPHAGEAL ECHOCARDIOGRAM (TEE);  Surgeon: Skeet Latch, MD;  [facility]  ENDOSCOPY;  Service: Cardiovascular;  Laterality: N/A;   TONSILLECTOMY  38  (age 24)   TOTAL KNEE ARTHROPLASTY Left 02/07/2017   Procedure: LEFT TOTAL KNEE ARTHROPLASTY;  Surgeon: Leandrew Koyanagi, MD;  Location: [place];  Service: Orthopedics;  Laterality: Left;   TOTAL KNEE ARTHROPLASTY Right 04/25/2017   Procedure: RIGHT TOTAL KNEE ARTHROPLASTY;  Surgeon: Leandrew Koyanagi, MD;  Location: [facility];  Service: Orthopedics;  Laterality: Right;   TOTAL KNEE REVISION Left 07/13/2022   Procedure: LEFT TOTAL KNEE REVISION, TIBIAL COMPONENT;  Surgeon: Leandrew Koyanagi, MD;  Location: [place];  Service: Orthopedics;  Laterality: Left;   TRANSURETHRAL RESECTION OF PROSTATE N/A 01/04/2017   Procedure: TRANSURETHRAL RESECTION OF THE PROSTATE (TURP);  Surgeon: Ardis Hughs, MD;  Location[facility];  Service: Urology;  Laterality: N/A;   Social History   Occupational History   Occupation: Government social research officer, Psychologist, occupational  Tobacco Use   Smoking status: Former    Packs/day: 1.00    Years: 6.00    Total pack years: 6.00    Types: Cigarettes    Quit date: 10/31/1969    Years since quitting: 52.8   Smokeless tobacco: Never  Vaping Use   Vaping Use: Never used  Substance and Sexual Activity   Alcohol use: Yes    Alcohol/week: 0.0 standard drinks of alcohol    Comment: OCCASIONAL   Drug use: No   Sexual activity: Never

## 2022-08-27 ENCOUNTER — Telehealth: Payer: Self-pay

## 2022-08-27 NOTE — Patient Outreach (Signed)
  Care Coordination   Initial Visit Note   08/27/2022 Name: OMARI MCMANAWAY MRN: [id number] DOB: [birth year]  KESLEY GAFFEY is a 71 y.o. year old male who sees Hamrick, Lorin Mercy, MD for primary care. I spoke with  Seward Grater by phone today.  What matters to the patients health and wellness today?  Placed call to patient to explain and offer [facility] care coordination program.  Patient reports that he is doing well and declines services at this time.    SDOH assessments and interventions completed:  No     Care Coordination Interventions Activated:  No  Care Coordination Interventions:  No, not indicated   Follow up plan: No further intervention required.   Encounter Outcome:  Pt. Refused   Tomasa Rand, RN, BSN, CEN [facility] [employer] [phone]

## 2022-09-05 NOTE — Progress Notes (Signed)
Office Visit    Patient Name: Matthew Schmitt Date of Encounter: 09/07/2022  PCP:  Leonides Sake, MD   [place]  Cardiologist:  Candee Furbish, MD  Advanced Practice Provider:  No care team member to display Electrophysiologist:  Vickie Epley, MD    HPI    Matthew Schmitt is a 71 y.o. male with a past medical history significant for atrial fibrillation status post ablation (10/27/2021, chronic HFrEF with prior EF 20 to 25% (repeat echo after cardioversion of atrial flutter EF improved to 50 to 55%), hypertension, RBBB, type 2 diabetes mellitus presents today for 45-monthfollow-up appointment.  He was recently seen by MStephan Minister NP for preop clearance of a total left knee revision.  Prior to that he was seen by Dr. SMarlou Porch4/02/2022.  He was doing better at that point.  He was taking 5 mg of Eliquis p.o. twice daily and was bruising easily but no serious bleeding.  He reported his blood pressure being up and down lately.  He had seen readings in the 1277Oto 1242Psystolic.  No recent medication changes and no low blood pressures.  He was requesting a change to Xarelto.  He states that he may miss 1 or 2 doses every week of his blood thinner but otherwise was compliant.  Today, he  tells me he had left knee surgery and is still having some pain and swelling. He had a DVT on that side and was placed on xarelto. He wishes to get off some of his medications but I explained that they are all needed as in fact we need to increase his Entresto today since his BP is still not controlled.  He is asked to take his blood pressure once a day an hour after morning medicines and keep track of those values.  If he notices his blood pressure is below 1536systolic or above 1144systolic he is to call our office.  No other cardiac complaints at this time.  Reports no shortness of breath nor dyspnea on exertion. Reports no chest pain, pressure, or tightness. No edema, orthopnea,  PND. Reports no palpitations.   Past Medical History    Past Medical History:  Diagnosis Date   Anxiety    Arthritis    everywhere   Arthrosis of right acromioclavicular joint 01/29/2020   Atrial flutter ([facility]    Onset May 2014 Cardioversion done    BPH associated with nocturia    BPH with obstruction/lower urinary tract symptoms 01/04/2017   CAD (coronary artery disease) 04/02/2013   Cath 2012 moderate mid LAD lesion, otherwise not much disease    Diabetes ([facility] 01/18/2018   Dilated aortic root (HCC)    aortic root 44 mm, ascending aorta 46 mm 12/22/21 echo   Encounter for long-term (current) use of other medications 07/27/2013   Essential hypertension, benign    First degree heart block    Heart murmur    mild-moderate AS and AR 01/11/22   History of bilateral knee replacement 02/07/2017   History of bladder stone    History of kidney stones    History of melanoma excision    June 2016  --- s/p  excision right  leg (per pt localized and no recurrence)   Hypertensive heart disease 04/02/2013   Impingement syndrome of right shoulder 01/29/2020   Impotence of organic origin    Left-sided low back pain with left-sided sciatica 01/23/2019   Long term current use of  anticoagulant therapy    Mixed hyperlipidemia    slightly   Multiple renal cysts    bilateral   Nephrolithiasis 05/17/2014   Nonischemic cardiomyopathy (HCC)    Osteoarthritis    PAF (paroxysmal atrial fibrillation) ([place])    followed by cardiology  (takes ASA)   RBBB    RBBB (right bundle branch block with left anterior fascicular block) 04/02/2013   Renal oncocytoma of left kidney 06/18/2013   s/p left robotic assited laparoscopic partial nephrectomy 06/18/13   Right ureteral stone    Rotator cuff syndrome of left shoulder 04/17/2016   Status post total left knee replacement 01/01/2020   Tendinopathy of right biceps tendon 01/29/2020   Tendinopathy of right rotator cuff 01/29/2020   Type 2 diabetes mellitus  treated with insulin [facility])    endocrinologist-  dr Loanne Drilling   Wears glasses    Past Surgical History:  Procedure Laterality Date   A-FLUTTER ABLATION N/A 10/27/2021   Procedure: A-FLUTTER ABLATION;  Surgeon: Vickie Epley, MD;  Location: [place] [facility];  Service: Cardiovascular;  Laterality: N/A;   CARDIAC CATHETERIZATION  08-17-2011  DR Frederico Hamman [place]   POSSIBLE MODERATE LAD STENOSIS JUST AFTER THE BIFURCATION OF LARGE DIAGONAL BRANCH/ LVEF  40-45%/ MILD GLOBAL HYPODINESIS (CATH DONE FOR ABNORMAL STRESS TEST OF FIXED LATERAL WALL DEFECT & BIFASCICULAR BLAOCK)   CARDIOVERSION N/A 04/02/2013   Procedure: CARDIOVERSION;  Surgeon: Jacolyn Reedy, MD;  Location: [place];  Service: Cardiovascular;  Laterality: N/A;   CARDIOVERSION N/A 12/27/2020   Procedure: CARDIOVERSION;  Surgeon: Skeet Latch, MD;  Location: [place];  Service: Cardiovascular;  Laterality: N/A;   CYSTOSCOPY WITH LITHOLAPAXY N/A 05/18/2015   Procedure: CYSTOSCOPY WITH LITHOLAPAXY;  Surgeon: Rana Snare, MD;  Location: [facility];  Service: Urology;  Laterality: N/A;   CYSTOSCOPY WITH LITHOLAPAXY N/A 01/04/2017   Procedure: CYSTOSCOPY WITH LITHOLAPAXY, Holmium Laser;  Surgeon: Ardis Hughs, MD;  Location: [facility];  Service: Urology;  Laterality: N/A;   CYSTOSCOPY WITH RETROGRADE PYELOGRAM, URETEROSCOPY AND STENT PLACEMENT  11/03/2012   Procedure: CYSTOSCOPY WITH RETROGRADE PYELOGRAM, URETEROSCOPY AND STENT PLACEMENT;  Surgeon: Bernestine Amass, MD;  Location: [facility];  Service: Urology;  Laterality: Left;  Cystoscopy/Left Retrograde/Ureteroscopy/Holmium Laser Litho/Double J Stent   CYSTOSCOPY WITH URETEROSCOPY, STONE BASKETRY AND STENT PLACEMENT Right 05/15/2019   Procedure: CYSTOSCOPY WITH URETEROSCOPY, LASER LITHOTRIPSY STONE BASKETRY AND STENT PLACEMENT, RIGHT RETROGRADE;  Surgeon: Ardis Hughs, MD;  Location: [facility];  Service: Urology;  Laterality:  Right;   HOLMIUM LASER APPLICATION  40/98/1191   Procedure: HOLMIUM LASER APPLICATION;  Surgeon: Bernestine Amass, MD;  Location: [facility];  Service: Urology;  Laterality: Left;   HOLMIUM LASER APPLICATION N/A 4/78/2956   Procedure: HOLMIUM LASER APPLICATION;  Surgeon: Rana Snare, MD;  Location: [facility];  Service: Urology;  Laterality: N/A;   KNEE SURGERY Bilateral [place] Bilateral June 2016   20th-- left leg //  1st-- right leg w/ skin graft from right thigh (no lymph node bx)   NEPHROLITHOTOMY Right 05/17/2014   Procedure: NEPHROLITHOTOMY PERCUTANEOUS;  Surgeon: Bernestine Amass, MD;  Location[facility];  Service: Urology;  Laterality: Right;   PILONIDAL CYST EXCISION     ROBOTIC ASSITED PARTIAL NEPHRECTOMY Left 06/18/2013   Procedure: ROBOTIC ASSISTED PARTIAL NEPHRECTOMY;  Surgeon: Dutch Gray, MD;  Location: [facility];  Service: Urology;  Laterality: Left;   ROTATOR CUFF REPAIR  Left 07/2016   SHOULDER ARTHROSCOPY WITH ROTATOR CUFF REPAIR AND SUBACROMIAL DECOMPRESSION Right 05/27/2020   Procedure: RIGHT SHOULDER ARTHROSCOPY WITH EXTENSIVE DEBRIDEMENT, DISTAL CLAVICLE EXCISION AND SUBACROMIAL DECOMPRESSION;  Surgeon: Leandrew Koyanagi, MD;  Location: [place];  Service: Orthopedics;  Laterality: Right;   TEE WITH CARDIOVERSION  12/27/2020   TEE WITHOUT CARDIOVERSION N/A 12/27/2020   Procedure: TRANSESOPHAGEAL ECHOCARDIOGRAM (TEE);  Surgeon: Skeet Latch, MD;  Location[facility];  Service: Cardiovascular;  Laterality: N/A;   TONSILLECTOMY  32  (age 53)   TOTAL KNEE ARTHROPLASTY Left 02/07/2017   Procedure: LEFT TOTAL KNEE ARTHROPLASTY;  Surgeon: Leandrew Koyanagi, MD;  Location: [place];  Service: Orthopedics;  Laterality: Left;   TOTAL KNEE ARTHROPLASTY Right 04/25/2017   Procedure: RIGHT TOTAL KNEE ARTHROPLASTY;  Surgeon: Leandrew Koyanagi, MD;  Location: [place];  Service: Orthopedics;  Laterality: Right;   TOTAL KNEE REVISION Left  07/13/2022   Procedure: LEFT TOTAL KNEE REVISION, TIBIAL COMPONENT;  Surgeon: Leandrew Koyanagi, MD;  Location: [place];  Service: Orthopedics;  Laterality: Left;   TRANSURETHRAL RESECTION OF PROSTATE N/A 01/04/2017   Procedure: TRANSURETHRAL RESECTION OF THE PROSTATE (TURP);  Surgeon: Ardis Hughs, MD;  [facility];  Service: Urology;  Laterality: N/A;    Allergies  Allergies  Allergen Reactions   Actos [Pioglitazone] Swelling and Cough    SWELLING REACTION UNSPECIFIED  EDEMA    EKGs/Labs/Other Studies Reviewed:   The following studies were reviewed today:  Echocardiogram 12/22/2021  IMPRESSIONS     1. Compared with the echo 05/6282, systolic function has improved.   2. Left ventricular ejection fraction, by estimation, is 50 to 55%. The  left ventricle has low normal function. The left ventricle demonstrates  regional wall motion abnormalities (see scoring diagram/findings for  description). The left ventricular  internal cavity size was moderately dilated. There is moderate concentric  left ventricular hypertrophy. Left ventricular diastolic parameters are  consistent with Grade I diastolic dysfunction (impaired relaxation). The  average left ventricular global  longitudinal strain is 21.9 %. The global longitudinal strain is normal.   3. Right ventricular systolic function is normal. The right ventricular  size is normal. There is normal pulmonary artery systolic pressure.   4. Left atrial size was severely dilated.   5. The mitral valve is normal in structure. Trivial mitral valve  regurgitation. No evidence of mitral stenosis.   6. The aortic valve is normal in structure. There is moderate  calcification of the aortic valve. There is moderate thickening of the  aortic valve. Aortic valve regurgitation is mild to moderate. Mild to  moderate aortic valve stenosis. Aortic  regurgitation PHT measures 600 msec. Aortic valve mean gradient measures  19.0 mmHg. Aortic valve  Vmax measures 3.04 m/s.   7. There is moderate dilatation of the ascending aorta, measuring 46 mm.  There is moderate dilatation of the aortic root, measuring 44 mm.   FINDINGS   Left Ventricle: Left ventricular ejection fraction, by estimation, is 50  to 55%. The left ventricle has low normal function. The left ventricle  demonstrates regional wall motion abnormalities. The average left  ventricular global longitudinal strain is  21.9 %. The global longitudinal strain is normal. The left ventricular  internal cavity size was moderately dilated. There is moderate concentric  left ventricular hypertrophy. Left ventricular diastolic parameters are  consistent with Grade I diastolic  dysfunction (impaired relaxation). Indeterminate filling pressures.      LV Wall Scoring:  The  anterior wall is hypokinetic. The entire inferior wall, apical  anterior  segment, and apex are normal.   Right Ventricle: The right ventricular size is normal. No increase in  right ventricular wall thickness. Right ventricular systolic function is  normal. There is normal pulmonary artery systolic pressure. The tricuspid  regurgitant velocity is 2.19 m/s, and   with an assumed right atrial pressure of 3 mmHg, the estimated right  ventricular systolic pressure is 13.2 mmHg.   Left Atrium: Left atrial size was severely dilated.   Right Atrium: Right atrial size was normal in size.   Pericardium: There is no evidence of pericardial effusion.   Mitral Valve: The mitral valve is normal in structure. Trivial mitral  valve regurgitation. No evidence of mitral valve stenosis.   Tricuspid Valve: The tricuspid valve is normal in structure. Tricuspid  valve regurgitation is trivial. No evidence of tricuspid stenosis.   Aortic Valve: The aortic valve is normal in structure. There is moderate  calcification of the aortic valve. There is moderate thickening of the  aortic valve. Aortic valve regurgitation is mild to  moderate. Aortic  regurgitation PHT measures 600 msec. Mild   to moderate aortic stenosis is present. Aortic valve mean gradient  measures 19.0 mmHg. Aortic valve peak gradient measures 37.0 mmHg. Aortic  valve area, by VTI measures 1.89 cm.   Pulmonic Valve: The pulmonic valve was normal in structure. Pulmonic valve  regurgitation is trivial. No evidence of pulmonic stenosis.   Aorta: The aortic root is normal in size and structure. There is moderate  dilatation of the ascending aorta, measuring 46 mm. There is moderate  dilatation of the aortic root, measuring 44 mm.   Venous: The inferior vena cava was not well visualized.   IAS/Shunts: No atrial level shunt detected by color flow Doppler.       EKG:  EKG is not ordered today.    Recent Labs: 07/10/2022: ALT 33; BUN 18; Creatinine, Ser 0.97; Potassium 4.1; Sodium 142 07/14/2022: Hemoglobin 14.0; Platelets 141  Recent Lipid Panel    Component Value Date/Time   CHOL 149 12/19/2020 1656   TRIG 165 (H) 12/19/2020 1656   HDL 42 12/19/2020 1656   CHOLHDL 3.5 12/19/2020 1656   CHOLHDL 4.2 07/20/2014 0822   VLDL 26 07/20/2014 0822   LDLCALC 79 12/19/2020 1656    Home Medications   Current Meds  Medication Sig   dapagliflozin propanediol (FARXIGA) 10 MG TABS tablet Take 1 tablet (10 mg total) by mouth daily before breakfast.   Insulin Glargine (BASAGLAR KWIKPEN) 100 UNIT/ML Inject 80 Units into the skin every morning. And pen needles 1/day (Patient taking differently: Inject 90 Units into the skin in the morning. And pen needles 1/day)   Insulin Pen Needle (PEN NEEDLES) 30G X 8 MM MISC 1 each by Does not apply route daily. E11.9   Lancets (ONETOUCH DELICA PLUS [id number]) MISC 1 each by Other route 2 (two) times daily. E11.9   methocarbamol (ROBAXIN) 750 MG tablet Take 1 tablet (750 mg total) by mouth 2 (two) times daily as needed for muscle spasms.   metoprolol succinate (TOPROL-XL) 50 MG 24 hr tablet TAKE 1 TABLET DAILY, TAKE  WITH OR IMMEDIATELY FOLLOWING A MEAL   nitroGLYCERIN (NITROSTAT) 0.4 MG SL tablet Place 0.4 mg under the tongue every 5 (five) minutes x 3 doses as needed for chest pain.   ONETOUCH ULTRA test strip USE 1 STRIP TWICE A DAY   rosuvastatin (CRESTOR) 20 MG tablet TAKE 1  TABLET DAILY   sacubitril-valsartan (ENTRESTO) 49-51 MG Take 1 tablet by mouth 2 (two) times daily.   Semaglutide (RYBELSUS) 3 MG TABS Take 3 mg by mouth daily.   spironolactone (ALDACTONE) 25 MG tablet TAKE ONE-HALF (1/2) TABLET DAILY   trolamine salicylate (ASPERCREME) 10 % cream Apply 1 application  topically 3 (three) times daily as needed for muscle pain.     Review of Systems      All other systems reviewed and are otherwise negative except as noted above.  Physical Exam    VS:  BP (!) 160/80   Pulse 78   Ht '5\' 11"'$  (1.803 m)   Wt 221 lb 3.2 oz (100.3 kg)   SpO2 97%   BMI 30.85 kg/m  , BMI Body mass index is 30.85 kg/m.  Wt Readings from Last 3 Encounters:  09/07/22 221 lb 3.2 oz (100.3 kg)  07/26/22 235 lb (106.6 kg)  07/13/22 235 lb 7.2 oz (106.8 kg)     GEN: Well nourished, well developed, in no acute distress. HEENT: normal. Neck: Supple, no JVD, carotid bruits, or masses. Cardiac: RRR, + diastolic murmurs, rubs, or gallops. No clubbing, cyanosis, edema.  Radials/PT 2+ and equal bilaterally.  Respiratory:  Respirations regular and unlabored, clear to auscultation bilaterally. GI: Soft, nontender, nondistended. MS: No deformity or atrophy. Skin: Warm and dry, no rash. Neuro:  Strength and sensation are intact. Psych: Normal affect.  Assessment & Plan    Hypertension -Increase Entresto today -I have asked the patient to keep track of his blood pressure an hour after morning medicines for the next 2 weeks -Please let me know if systolic blood pressure is less than 100 or greater than 150  Aortic stenosis -+ murmur -will need follow-up echo at next office appointment -Remains  asymptomatic  Bicuspid aortic valve/dilated aortic root -Follow-up echocardiogram at next office appointment in the spring -Asymptomatic at this time  RBBB -chronic -no EKG today -stable  Hyperlipidemia -Lipid panel today and LFTs -continue crestor  Hypercoagulable state -He had a DVT complication in the left lower leg after his knee surgery -Continue Xarelto -No issues with significant bleeding  Chronic systolic heart failure -Continue current medication regimen which includes Farxiga 10 mg daily, metoprolol succinate 50 mg daily, nitro as needed, Xarelto 20 mg daily, Crestor 20 mg daily, Entresto 49-51 mg twice daily (increased today), spironolactone 12.5 mg daily     Disposition: Follow up 6 months with Candee Furbish, MD or APP.  Signed, Elgie Collard, PA-C 09/07/2022, 8:15 AM [facility]

## 2022-09-07 ENCOUNTER — Ambulatory Visit: Payer: BC Managed Care – PPO | Attending: Physician Assistant | Admitting: Physician Assistant

## 2022-09-07 ENCOUNTER — Encounter: Payer: Self-pay | Admitting: Physician Assistant

## 2022-09-07 VITALS — BP 160/80 | HR 78 | Ht 71.0 in | Wt 221.2 lb

## 2022-09-07 DIAGNOSIS — I350 Nonrheumatic aortic (valve) stenosis: Secondary | ICD-10-CM

## 2022-09-07 DIAGNOSIS — I452 Bifascicular block: Secondary | ICD-10-CM | POA: Diagnosis not present

## 2022-09-07 DIAGNOSIS — Q231 Congenital insufficiency of aortic valve: Secondary | ICD-10-CM | POA: Diagnosis not present

## 2022-09-07 DIAGNOSIS — I5022 Chronic systolic (congestive) heart failure: Secondary | ICD-10-CM

## 2022-09-07 DIAGNOSIS — I483 Typical atrial flutter: Secondary | ICD-10-CM | POA: Diagnosis not present

## 2022-09-07 DIAGNOSIS — I4892 Unspecified atrial flutter: Secondary | ICD-10-CM

## 2022-09-07 LAB — LIPID PANEL
Chol/HDL Ratio: 3.2 ratio (ref 0.0–5.0)
Cholesterol, Total: 114 mg/dL (ref 100–199)
HDL: 36 mg/dL — ABNORMAL LOW (ref >39)
LDL Chol Calc (NIH): 57 mg/dL (ref 0–99)
Triglycerides: 112 mg/dL (ref 0–149)
VLDL Cholesterol Cal: 21 mg/dL (ref 5–40)

## 2022-09-07 LAB — HEPATIC FUNCTION PANEL
ALT: 16 IU/L (ref 0–44)
AST: 16 IU/L (ref 0–40)
Albumin: 4.4 g/dL (ref 3.8–4.8)
Alkaline Phosphatase: 97 IU/L (ref 44–121)
Bilirubin Total: 0.4 mg/dL (ref 0.0–1.2)
Bilirubin, Direct: 0.18 mg/dL (ref 0.00–0.40)
Total Protein: 6.3 g/dL (ref 6.0–8.5)

## 2022-09-07 MED ORDER — SACUBITRIL-VALSARTAN 97-103 MG PO TABS: 1.0000 | ORAL_TABLET | Freq: Two times a day (BID) | ORAL | 3 refills | Status: DC

## 2022-09-07 NOTE — Patient Instructions (Signed)
Medication Instructions:  Increase entresto to 97-103 mg twice daily *If you need a refill on your cardiac medications before your next appointment, please call your pharmacy*   Lab Work: Lipids and lft's today If you have labs (blood work) drawn today and your tests are completely normal, you will receive your results only by: [place] (if you have MyChart) OR A paper copy in the mail If you have any lab test that is abnormal or we need to change your treatment, we will call you to review the results.   Follow-Up: At [facility], you and your health needs are our priority.  As part of our continuing mission to provide you with exceptional heart care, we have created designated Provider Care Teams.  These Care Teams include your primary Cardiologist (physician) and Advanced Practice Providers (APPs -  Physician Assistants and Nurse Practitioners) who all work together to provide you with the care you need, when you need it.  We recommend signing up for the patient portal called "MyChart".  Sign up information is provided on this After Visit Summary.  MyChart is used to connect with patients for Virtual Visits (Telemedicine).  Patients are able to view lab/test results, encounter notes, upcoming appointments, etc.  Non-urgent messages can be sent to your provider as well.   To learn more about what you can do with MyChart, go to [email or web address].    Your next appointment:   6 month(s)  The format for your next appointment:   In Person  Provider:   Candee Furbish, MD    Other Instructions Check your blood pressure daily for the next two weeks, one hour after taking your morning medications, keep a log and call us with the readings. If your top number is less that 100 or greater than 150 call and let us know.  Important Information About Sugar

## 2022-09-27 ENCOUNTER — Other Ambulatory Visit: Payer: Self-pay | Admitting: Internal Medicine

## 2022-10-05 ENCOUNTER — Ambulatory Visit (INDEPENDENT_AMBULATORY_CARE_PROVIDER_SITE_OTHER): Payer: BC Managed Care – PPO | Admitting: Orthopaedic Surgery

## 2022-10-05 ENCOUNTER — Ambulatory Visit (INDEPENDENT_AMBULATORY_CARE_PROVIDER_SITE_OTHER): Payer: BC Managed Care – PPO

## 2022-10-05 DIAGNOSIS — Z96652 Presence of left artificial knee joint: Secondary | ICD-10-CM | POA: Diagnosis not present

## 2022-10-05 NOTE — Progress Notes (Signed)
Post-Op Visit Note   Patient: Matthew Schmitt           Date of Birth: 1951-09-08           MRN: [id number] Visit Date: 10/05/2022 PCP: Matthew Sake, MD   Assessment & Plan:  Chief Complaint:  Chief Complaint  Patient presents with   Left Knee - Pain   Visit Diagnoses:  1. S/P TKR (total knee replacement), left     Plan: Matthew Schmitt is 3 months status post left knee revision of tibial component for loosening.  He has been back to work.  He is experiencing a significant mount of start of pain and stiffness.  He still has some residual swelling due to the DVT.  He is likely going to be on lifelong Xarelto.  Examination of left knee shows a moderate to the large joint effusion.  His range of motion is slightly limited.  No signs of infection.  Good varus valgus stability.  X-rays show stable implant without complications.  [name] is recovering appropriately from his recent surgery.  I explained that his symptoms are due to the effusion which is and reaction to activity.  He will try to back down on his activities much as he can.  He is going to the [employer] soon to do some fishing and I think from a surgical site standpoint he is fine to do that.  Dental prophylaxis reinforced.  Recheck in 3 months with two-view x-rays of the left knee.  Follow-Up Instructions: Return in about 3 months (around 01/05/2023).   Orders:  Orders Placed This Encounter  Procedures   XR Knee 1-2 Views Left   No orders of the defined types were placed in this encounter.   Imaging: XR Knee 1-2 Views Left  Result Date: 10/05/2022 2 view x-rays of the left knee show stable total knee replacement with revision tibia in good alignment and without evidence of loosening.   PMFS History: Patient Active Problem List   Diagnosis Date Noted   Status post revision of total replacement of left knee 07/13/2022   S/P revision of total knee, left 07/13/2022   Hypercoagulable state due to atrial flutter ([place])  02/20/2022   Preop cardiovascular exam 02/20/2022   Loose left total knee arthroplasty ([place]) 40/98/1191   Chronic systolic heart failure ([place]) 08/18/2021   Aortic stenosis 08/18/2021   Chronic kidney disease, stage 3a ([place]) 08/18/2021   Dilated aortic root ([place]) 08/18/2021   Bicuspid aortic valve 08/18/2021   AKI (acute kidney injury) ([place]) 12/29/2020   Hypokalemia 12/29/2020   Obstructive sleep apnea 12/29/2020   Thrombocytopenia ([place]) 12/29/2020   Hematuria 12/29/2020   Wears glasses    Type 2 diabetes mellitus treated with insulin ([place])    Right ureteral stone    RBBB    PAF (paroxysmal atrial fibrillation) ([place])    Multiple renal cysts    Impotence of organic origin    History of melanoma excision    History of kidney stones    History of bladder stone    Heart murmur    First degree heart block    Hypertension    BPH associated with nocturia    Arthritis    Anxiety    Tendinopathy of right rotator cuff 01/29/2020   Tendinopathy of right biceps tendon 01/29/2020   Impingement syndrome of right shoulder 01/29/2020   Arthrosis of right acromioclavicular joint 01/29/2020   Chronic right shoulder pain 01/01/2020   Status post total left knee  replacement 01/01/2020   Left-sided low back pain with left-sided sciatica 01/23/2019   Poorly controlled type 2 diabetes mellitus with circulatory disorder ([place]) 01/18/2018   History of bilateral knee replacement 02/07/2017   Primary osteoarthritis of left knee    History of renal cell carcinoma 01/2017   BPH with obstruction/lower urinary tract symptoms 01/04/2017   Rotator cuff syndrome of left shoulder 04/17/2016   Nephrolithiasis 05/17/2014   Encounter for long-term (current) use of other medications 07/27/2013   Long term current use of anticoagulant therapy    CAD (coronary artery disease) 04/02/2013   Hypertensive heart disease 04/02/2013   RBBB (right bundle branch block with left anterior fascicular block) 04/02/2013    Atrial flutter (HCC)    Nonischemic cardiomyopathy (HCC)    Obesity (BMI 30-39.9)    History of kidney stones    Osteoarthritis    Hyperlipidemia    Past Medical History:  Diagnosis Date   Anxiety    Arthritis    everywhere   Arthrosis of right acromioclavicular joint 01/29/2020   Atrial flutter [facility])    Onset May 2014 Cardioversion done    BPH associated with nocturia    BPH with obstruction/lower urinary tract symptoms 01/04/2017   CAD (coronary artery disease) 04/02/2013   Cath 2012 moderate mid LAD lesion, otherwise not much disease    Diabetes ([place]) 01/18/2018   Dilated aortic root (HCC)    aortic root 44 mm, ascending aorta 46 mm 12/22/21 echo   Encounter for long-term (current) use of other medications 07/27/2013   Essential hypertension, benign    First degree heart block    Heart murmur    mild-moderate AS and AR 01/11/22   History of bilateral knee replacement 02/07/2017   History of bladder stone    History of kidney stones    History of melanoma excision    June 2016  --- s/p  excision right  leg (per pt localized and no recurrence)   Hypertensive heart disease 04/02/2013   Impingement syndrome of right shoulder 01/29/2020   Impotence of organic origin    Left-sided low back pain with left-sided sciatica 01/23/2019   Long term current use of anticoagulant therapy    Mixed hyperlipidemia    slightly   Multiple renal cysts    bilateral   Nephrolithiasis 05/17/2014   Nonischemic cardiomyopathy (HCC)    Osteoarthritis    PAF (paroxysmal atrial fibrillation) ([place])    followed by cardiology  (takes ASA)   RBBB    RBBB (right bundle branch block with left anterior fascicular block) 04/02/2013   Renal oncocytoma of left kidney 06/18/2013   s/p left robotic assited laparoscopic partial nephrectomy 06/18/13   Right ureteral stone    Rotator cuff syndrome of left shoulder 04/17/2016   Status post total left knee replacement 01/01/2020   Tendinopathy of right biceps  tendon 01/29/2020   Tendinopathy of right rotator cuff 01/29/2020   Type 2 diabetes mellitus treated with insulin [facility])    endocrinologist-  dr Loanne Drilling   Wears glasses     Family History  Problem Relation Age of Onset   Arthritis Father    Hypertension Father    Diabetes Father    Arthritis Mother    Hypertension Mother    Heart attack Brother 54   Diabetes Brother    Lymphoma Paternal Grandfather    Cancer Maternal Uncle    Cancer Paternal Uncle        type unknown    Past Surgical  History:  Procedure Laterality Date   A-FLUTTER ABLATION N/A 10/27/2021   Procedure: A-FLUTTER ABLATION;  Surgeon: Vickie Epley, MD;  Location: [place] [facility];  Service: Cardiovascular;  Laterality: N/A;   CARDIAC CATHETERIZATION  08-17-2011  DR Frederico Hamman TILLEY   POSSIBLE MODERATE LAD STENOSIS JUST AFTER THE BIFURCATION OF LARGE DIAGONAL BRANCH/ LVEF  40-45%/ MILD GLOBAL HYPODINESIS (CATH DONE FOR ABNORMAL STRESS TEST OF FIXED LATERAL WALL DEFECT & BIFASCICULAR BLAOCK)   CARDIOVERSION N/A 04/02/2013   Procedure: CARDIOVERSION;  Surgeon: Jacolyn Reedy, MD;  Location: [place];  Service: Cardiovascular;  Laterality: N/A;   CARDIOVERSION N/A 12/27/2020   Procedure: CARDIOVERSION;  Surgeon: Skeet Latch, MD;  Location: [place];  Service: Cardiovascular;  Laterality: N/A;   CYSTOSCOPY WITH LITHOLAPAXY N/A 05/18/2015   Procedure: CYSTOSCOPY WITH LITHOLAPAXY;  Surgeon: Rana Snare, MD;  Location: [facility];  Service: Urology;  Laterality: N/A;   CYSTOSCOPY WITH LITHOLAPAXY N/A 01/04/2017   Procedure: CYSTOSCOPY WITH LITHOLAPAXY, Holmium Laser;  Surgeon: Ardis Hughs, MD;  Location: [facility];  Service: Urology;  Laterality: N/A;   CYSTOSCOPY WITH RETROGRADE PYELOGRAM, URETEROSCOPY AND STENT PLACEMENT  11/03/2012   Procedure: CYSTOSCOPY WITH RETROGRADE PYELOGRAM, URETEROSCOPY AND STENT PLACEMENT;  Surgeon: Bernestine Amass, MD;  Location: [facility];  Service:  Urology;  Laterality: Left;  Cystoscopy/Left Retrograde/Ureteroscopy/Holmium Laser Litho/Double J Stent   CYSTOSCOPY WITH URETEROSCOPY, STONE BASKETRY AND STENT PLACEMENT Right 05/15/2019   Procedure: CYSTOSCOPY WITH URETEROSCOPY, LASER LITHOTRIPSY STONE BASKETRY AND STENT PLACEMENT, RIGHT RETROGRADE;  Surgeon: Ardis Hughs, MD;  Location: [facility];  Service: Urology;  Laterality: Right;   HOLMIUM LASER APPLICATION  84/66/5993   Procedure: HOLMIUM LASER APPLICATION;  Surgeon: Bernestine Amass, MD;  Location[facility];  Service: Urology;  Laterality: Left;   HOLMIUM LASER APPLICATION N/A 5/70/1779   Procedure: HOLMIUM LASER APPLICATION;  Surgeon: Rana Snare, MD;  Location: [facility];  Service: Urology;  Laterality: N/A;   KNEE SURGERY Bilateral Nord Bilateral June 2016   20th-- left leg //  1st-- right leg w/ skin graft from right thigh (no lymph node bx)   NEPHROLITHOTOMY Right 05/17/2014   Procedure: NEPHROLITHOTOMY PERCUTANEOUS;  Surgeon: Bernestine Amass, MD;  Location: [facility];  Service: Urology;  Laterality: Right;   PILONIDAL CYST EXCISION     ROBOTIC ASSITED PARTIAL NEPHRECTOMY Left 06/18/2013   Procedure: ROBOTIC ASSISTED PARTIAL NEPHRECTOMY;  Surgeon: Dutch Gray, MD;  Location: [facility];  Service: Urology;  Laterality: Left;   ROTATOR CUFF REPAIR Left 07/2016   SHOULDER ARTHROSCOPY WITH ROTATOR CUFF REPAIR AND SUBACROMIAL DECOMPRESSION Right 05/27/2020   Procedure: RIGHT SHOULDER ARTHROSCOPY WITH EXTENSIVE DEBRIDEMENT, DISTAL CLAVICLE EXCISION AND SUBACROMIAL DECOMPRESSION;  Surgeon: Leandrew Koyanagi, MD;  Location: [place];  Service: Orthopedics;  Laterality: Right;   TEE WITH CARDIOVERSION  12/27/2020   TEE WITHOUT CARDIOVERSION N/A 12/27/2020   Procedure: TRANSESOPHAGEAL ECHOCARDIOGRAM (TEE);  Surgeon: Skeet Latch, MD;  Location: [facility];  Service: Cardiovascular;  Laterality: N/A;    TONSILLECTOMY  51  (age 33)   TOTAL KNEE ARTHROPLASTY Left 02/07/2017   Procedure: LEFT TOTAL KNEE ARTHROPLASTY;  Surgeon: Leandrew Koyanagi, MD;  Location: [place];  Service: Orthopedics;  Laterality: Left;   TOTAL KNEE ARTHROPLASTY Right 04/25/2017   Procedure: RIGHT TOTAL KNEE ARTHROPLASTY;  Surgeon: Leandrew Koyanagi, MD;  Location: [place];  Service: Orthopedics;  Laterality: Right;  TOTAL KNEE REVISION Left 07/13/2022   Procedure: LEFT TOTAL KNEE REVISION, TIBIAL COMPONENT;  Surgeon: Leandrew Koyanagi, MD;  Location: [place];  Service: Orthopedics;  Laterality: Left;   TRANSURETHRAL RESECTION OF PROSTATE N/A 01/04/2017   Procedure: TRANSURETHRAL RESECTION OF THE PROSTATE (TURP);  Surgeon: Ardis Hughs, MD;  Location[facility];  Service: Urology;  Laterality: N/A;   Social History   Occupational History   Occupation: Government social research officer, Psychologist, occupational  Tobacco Use   Smoking status: Former    Packs/day: 1.00    Years: 6.00    Total pack years: 6.00    Types: Cigarettes    Quit date: 10/31/1969    Years since quitting: 52.9   Smokeless tobacco: Never  Vaping Use   Vaping Use: Never used  Substance and Sexual Activity   Alcohol use: Yes    Alcohol/week: 0.0 standard drinks of alcohol    Comment: OCCASIONAL   Drug use: No   Sexual activity: Never

## 2022-10-18 NOTE — Progress Notes (Signed)
Left message for patient to return call.

## 2022-10-19 ENCOUNTER — Telehealth: Payer: Self-pay | Admitting: Cardiology

## 2022-10-19 NOTE — Progress Notes (Signed)
Spoke with patient and he states that he is unsure of his xarelto dose and he is not at home, but he does take it once daily. He will call on Monday with an update.

## 2022-10-19 NOTE — Telephone Encounter (Signed)
Patient states he is returning a call, but he does not know what the call was regarding.

## 2022-10-19 NOTE — Telephone Encounter (Signed)
Spoke with pt - he is unsure of who called him, maybe someone named Jenny Reichmann?  Advised I see no documentation of anyone calling from this office.  He does have appts at Dr Nicole Cella office 12/6, of which he is aware.  Pt will await a return call by the original caller.

## 2022-10-23 ENCOUNTER — Ambulatory Visit: Payer: BC Managed Care – PPO | Admitting: Interventional Cardiology

## 2022-10-24 ENCOUNTER — Ambulatory Visit (HOSPITAL_COMMUNITY)
Admission: RE | Admit: 2022-10-24 | Discharge: 2022-10-24 | Disposition: A | Discharge status: Home / Self Care | Payer: BC Managed Care – PPO | Source: Ambulatory Visit | Attending: Vascular Surgery | Admitting: Vascular Surgery

## 2022-10-24 ENCOUNTER — Encounter: Payer: Self-pay | Admitting: Vascular Surgery

## 2022-10-24 ENCOUNTER — Ambulatory Visit: Payer: BC Managed Care – PPO | Admitting: Vascular Surgery

## 2022-10-24 VITALS — BP 126/70 | HR 80 | Temp 98.1°F | Resp 16 | Ht 71.0 in | Wt 221.0 lb

## 2022-10-24 DIAGNOSIS — I82452 Acute embolism and thrombosis of left peroneal vein: Secondary | ICD-10-CM

## 2022-10-24 DIAGNOSIS — I82402 Acute embolism and thrombosis of unspecified deep veins of left lower extremity: Secondary | ICD-10-CM | POA: Insufficient documentation

## 2022-10-24 NOTE — Progress Notes (Signed)
REASON FOR VISIT:   Follow-up of left calf DVT.  MEDICAL ISSUES:   LEFT CALF DVT: This patient had a DVT involving the peroneal veins, posterior tibial veins, and tibioperoneal trunk vein after knee replacement in August.  He has been on Xarelto s 07/15/2022.  He comes in for 104-monthfollow-up visit.  Duplex scan today shows complete resolution of the clot.  From my standpoint he can discontinue his Xarelto.  He has an appointment with Dr. SMarlou Porchnext month and would like to discuss this with him to be sure he is not on it for any cardiac issues.  PSEUDOANEURYSM LEFT POPLITEAL ARTERY: On the study back in August there was an incidental finding of a pseudoaneurysm coming off the popliteal artery.  On the more recent study in September the this had clotted off.  On today's study the hematoma has resolved.   HPI:   Matthew BARELLAis a pleasant 71y.o. male who I saw on 07/26/2022 with a DVT of the left leg.  This occurred after a left total knee revision.  Only the tibial veins were involved and there was no proximal clot.  He was started on Xarelto on 07/15/2022.  I recommended a full 3 months of Xarelto.  At that visit we also discussed the importance of leg elevation and compression therapy.  He comes in for 314-monthollow-up visit.  Of note, an incidental finding on a venous duplex scan on 07/15/2022 was a pseudoaneurysm originating off of the left popliteal artery.  This had a very short neck.  Repeat duplex on 07/26/2022 showed that the pseudoaneurysm had thrombosed.  I felt that the hematoma behind the knee would take some time to resolve.  Since I saw him last he did have a small blister on his left leg which broke 3 days ago and he has a superficial ulceration here.  He tells me this happens quite a bit.  He denies any claudication or rest pain.  Past Medical History:  Diagnosis Date   Anxiety    Arthritis    everywhere   Arthrosis of right acromioclavicular joint 01/29/2020   [facility]   Onset May 2014 Cardioversion done    BPH associated with nocturia    BPH with obstruction/lower urinary tract symptoms 01/04/2017   CAD (coronary artery disease) 04/02/2013   Cath 2012 moderate mid LAD lesion, otherwise not much disease    Diabetes [place]/12/2017   Dilated aortic root (HCC)    aortic root 44 mm, ascending aorta 46 mm 12/22/21 echo   Encounter for long-term (current) use of other medications 07/27/2013   Essential hypertension, benign    First degree heart block    Heart murmur    mild-moderate AS and AR 01/11/22   History of bilateral knee replacement 02/07/2017   History of bladder stone    History of kidney stones    History of melanoma excision    June 2016  --- s/p  excision right  leg (per pt localized and no recurrence)   Hypertensive heart disease 04/02/2013   Impingement syndrome of right shoulder 01/29/2020   Impotence of organic origin    Left-sided low back pain with left-sided sciatica 01/23/2019   Long term current use of anticoagulant therapy    Mixed hyperlipidemia    slightly   Multiple renal cysts    bilateral   Nephrolithiasis 05/17/2014   Nonischemic cardiomyopathy (HCC)    Osteoarthritis    PAF (paroxysmal  atrial fibrillation) ([place])    followed by cardiology  (takes ASA)   RBBB    RBBB (right bundle branch block with left anterior fascicular block) 04/02/2013   Renal oncocytoma of left kidney 06/18/2013   s/p left robotic assited laparoscopic partial nephrectomy 06/18/13   Right ureteral stone    Rotator cuff syndrome of left shoulder 04/17/2016   Status post total left knee replacement 01/01/2020   Tendinopathy of right biceps tendon 01/29/2020   Tendinopathy of right rotator cuff 01/29/2020   Type 2 diabetes mellitus treated with insulin [facility])    endocrinologist-  dr Loanne Drilling   Wears glasses     Family History  Problem Relation Age of Onset   Arthritis Father    Hypertension Father    Diabetes Father    Arthritis  Mother    Hypertension Mother    Heart attack Brother 26   Diabetes Brother    Lymphoma Paternal Grandfather    Cancer Maternal Uncle    Cancer Paternal Uncle        type unknown    SOCIAL HISTORY: Social History   Tobacco Use   Smoking status: Former    Packs/day: 1.00    Years: 6.00    Total pack years: 6.00    Types: Cigarettes    Quit date: 10/31/1969    Years since quitting: 53.0   Smokeless tobacco: Never  Substance Use Topics   Alcohol use: Yes    Alcohol/week: 0.0 standard drinks of alcohol    Comment: OCCASIONAL    Allergies  Allergen Reactions   Actos [Pioglitazone] Swelling and Cough    SWELLING REACTION UNSPECIFIED  EDEMA    Current Outpatient Medications  Medication Sig Dispense Refill   dapagliflozin propanediol (FARXIGA) 10 MG TABS tablet Take 1 tablet (10 mg total) by mouth daily before breakfast. 90 tablet 3   Insulin Glargine (BASAGLAR KWIKPEN) 100 UNIT/ML Inject 80 Units into the skin every morning. And pen needles 1/day (Patient taking differently: Inject 90 Units into the skin in the morning. And pen needles 1/day) 90 mL 3   Insulin Pen Needle (PEN NEEDLES) 30G X 8 MM MISC 1 each by Does not apply route daily. E11.9 90 each 0   Lancets (ONETOUCH DELICA PLUS CHENID78E) MISC 1 each by Other route 2 (two) times daily. E11.9 200 each 3   metoprolol succinate (TOPROL-XL) 50 MG 24 hr tablet TAKE 1 TABLET DAILY, TAKE WITH OR IMMEDIATELY FOLLOWING A MEAL 90 tablet 3   nitroGLYCERIN (NITROSTAT) 0.4 MG SL tablet Place 0.4 mg under the tongue every 5 (five) minutes x 3 doses as needed for chest pain.     ONETOUCH ULTRA test strip USE 1 STRIP TWICE A DAY 200 strip 3   rosuvastatin (CRESTOR) 20 MG tablet TAKE 1 TABLET DAILY 90 tablet 3   sacubitril-valsartan (ENTRESTO) 97-103 MG Take 1 tablet by mouth 2 (two) times daily. 180 tablet 3   spironolactone (ALDACTONE) 25 MG tablet TAKE ONE-HALF (1/2) TABLET DAILY 45 tablet 3   trolamine salicylate (ASPERCREME) 10 %  cream Apply 1 application  topically 3 (three) times daily as needed for muscle pain.     methocarbamol (ROBAXIN) 750 MG tablet Take 1 tablet (750 mg total) by mouth 2 (two) times daily as needed for muscle spasms. (Patient not taking: Reported on 10/24/2022) 30 tablet 3   Rivaroxaban (XARELTO) 15 MG TABS tablet Take 1 tablet (15 mg total) by mouth 2 (two) times daily for 19 days. 8/29 to 9/17 -  take '15mg'$  by mouth twice daily Then on 9/18 - decrease to previous home dose of '20mg'$  daily 38 tablet 0   Semaglutide (RYBELSUS) 3 MG TABS TAKE 1 TABLET (3 MG) BY MOUTH DAILY. (Patient not taking: Reported on 10/24/2022) 30 tablet 0   No current facility-administered medications for this visit.    REVIEW OF SYSTEMS:  '[X]'$  denotes positive finding, '[ ]'$  denotes negative finding Cardiac  Comments:  Chest pain or chest pressure:    Shortness of breath upon exertion:    Short of breath when lying flat:    Irregular heart rhythm:        Vascular    Pain in calf, thigh, or hip brought on by ambulation:    Pain in feet at night that wakes you up from your sleep:     Blood clot in your veins:    Leg swelling:  x       Pulmonary    Oxygen at home:    Productive cough:     Wheezing:         Neurologic    Sudden weakness in arms or legs:     Sudden numbness in arms or legs:     Sudden onset of difficulty speaking or slurred speech:    Temporary loss of vision in one eye:     Problems with dizziness:         Gastrointestinal    Blood in stool:     Vomited blood:         Genitourinary    Burning when urinating:     Blood in urine:        Psychiatric    Major depression:         Hematologic    Bleeding problems:    Problems with blood clotting too easily:        Skin    Rashes or ulcers:        Constitutional    Fever or chills:     PHYSICAL EXAM:   Vitals:   10/24/22 1259  Resp: 16  Weight: 221 lb (100.2 kg)  Height: '5\' 11"'$  (1.803 m)    GENERAL: The patient is a well-nourished  male, in no acute distress. The vital signs are documented above. CARDIAC: There is a regular rate and rhythm.  VASCULAR: I do not detect carotid bruits. He has palpable femoral, popliteal, and pedal pulses bilaterally. He has bilateral lower extremity swelling and hyperpigmentation. He has a superficial ulceration where a blister broke on the left leg. PULMONARY: There is good air exchange bilaterally without wheezing or rales. ABDOMEN: Soft and non-tender with normal pitched bowel sounds.  MUSCULOSKELETAL: There are no major deformities or cyanosis. NEUROLOGIC: No focal weakness or paresthesias are detected. SKIN:  PSYCHIATRIC: The patient has a normal affect.  DATA:    VENOUS DUPLEX: I have independently interpreted his venous duplex scan today.  There is no evidence of DVT in the left lower extremity.  The DVTs that would involve the posterior tibial veins, peroneal veins, and tibial peroneal trunk vein have resolved.  In addition a pseudoaneurysm is no longer visualized.  Deitra Mayo [facility] [phone]

## 2022-11-15 ENCOUNTER — Encounter: Payer: Self-pay | Admitting: Internal Medicine

## 2022-11-15 ENCOUNTER — Ambulatory Visit: Payer: BC Managed Care – PPO | Admitting: Internal Medicine

## 2022-11-15 VITALS — BP 144/78 | HR 58 | Ht 71.0 in | Wt 222.0 lb

## 2022-11-15 DIAGNOSIS — E1159 Type 2 diabetes mellitus with other circulatory complications: Secondary | ICD-10-CM | POA: Diagnosis not present

## 2022-11-15 DIAGNOSIS — E1165 Type 2 diabetes mellitus with hyperglycemia: Secondary | ICD-10-CM

## 2022-11-15 DIAGNOSIS — E785 Hyperlipidemia, unspecified: Secondary | ICD-10-CM | POA: Diagnosis not present

## 2022-11-15 LAB — POCT GLYCOSYLATED HEMOGLOBIN (HGB A1C): Hemoglobin A1C: 8.7 % — AB (ref 4.0–5.6)

## 2022-11-15 MED ORDER — TRESIBA FLEXTOUCH 200 UNIT/ML ~~LOC~~ SOPN: 90.0000 [IU] | PEN_INJECTOR | Freq: Every day | SUBCUTANEOUS | 3 refills | Status: DC

## 2022-11-15 MED ORDER — SEMAGLUTIDE(0.25 OR 0.5MG/DOS) 2 MG/3ML ~~LOC~~ SOPN: 0.5000 mg | PEN_INJECTOR | SUBCUTANEOUS | 3 refills | Status: DC

## 2022-11-15 NOTE — Patient Instructions (Addendum)
Please continue: - Farxiga 10 mg in am  Try to change from Basaglar to: - Tresiba 90 units daily  Please start: - Ozempic 0.25 mg weekly in a.m. (for example on Sunday morning) x 4 weeks, then increase to 0.5 mg weekly in a.m. if no nausea or hypoglycemia.  Please return in 3-4 months with your sugar log.

## 2022-11-15 NOTE — Progress Notes (Signed)
Patient ID: Matthew Schmitt, male   DOB: 04-26-1951, 71 y.o.   MRN: [id number]  HPI: Matthew Schmitt is a 71 y.o.-year-old male, returning for follow-up for DM2, dx in 2002, insulin-dependent since 2017, uncontrolled, with complications (CAD, nonischemic cardiomyopathy, atrial flutter/PAF). Pt. previously saw Dr. Loanne Drilling, but last visit with me 4 months ago.  Interim history: Since last visit, he had TKR -he developed DVT afterwards >> resolved, but still has pain. No increased urination, blurry vision, nausea, chest pain. He was able to lose 7 pounds since last visit, despite coming off Rybelsus the first month, when his prescription ran out, as this was not refilled.  Reviewed HbA1c: Lab Results  Component Value Date   HGBA1C 7.4 (H) 06/13/2022   HGBA1C 7.9 (H) 05/04/2022   HGBA1C 8.6 (H) 04/06/2022   HGBA1C 8.5 (H) 03/06/2022   HGBA1C 8.0 (A) 01/05/2022   HGBA1C 6.4 (A) 08/04/2021   HGBA1C 6.4 (A) 03/24/2021   HGBA1C 8.3 (A) 12/20/2020   HGBA1C 6.7 (A) 08/19/2020   HGBA1C 6.9 (A) 04/22/2020   Pt is on a regimen of: - Farxiga 10 mg in am - Rybelsus 3 mg before b'fast - added 06/2022 >> stopped b/c no refills - Basaglar 90 units at bedtime He developed cough and edema from Actos. He tried Metformin >> gained weight.  He tried Trulicity before  - no GI sxs.  Pt checks his sugars 1x a day and they are: - am: 92-260 (missed meds) >> 108, 126-290 - 2h after b'fast: n/c - before lunch: n/c - 2h after lunch: n/c - before dinner: n/c - 2h after dinner: n/c - bedtime: n/c - nighttime: n/c Lowest sugar was 22  - years ago; more recently: 92 >> 108. Highest sugar was 312>> 290.  Glucometer: One Touch ultra  - + Mild CKD, last BUN/creatinine:  Lab Results  Component Value Date   BUN 18 07/10/2022   BUN 26 10/05/2021   CREATININE 0.97 07/10/2022   CREATININE 1.15 10/05/2021  04/24/2022: Glu 64, BUN/cr 21/1.11 On Entresto.  -+ HL; last set of lipids: Lab Results  Component  Value Date   CHOL 114 09/07/2022   HDL 36 (L) 09/07/2022   LDLCALC 57 09/07/2022   TRIG 112 09/07/2022   CHOLHDL 3.2 09/07/2022  04/28/2022: 127/86/38/72 He is on Crestor 20 mg daily.  - last eye exam was in 2020. No DR reportedly.   - no numbness and tingling in his feet.  Last foot exam 08/04/2021.  He also has a history of nephrolithiasis, right bundle branch block. He sees urology for a high PSA.  ROS: + see HPI  Past Medical History:  Diagnosis Date   Anxiety    Arthritis    everywhere   Arthrosis of right acromioclavicular joint 01/29/2020   Atrial flutter [facility])    Onset May 2014 Cardioversion done    BPH associated with nocturia    BPH with obstruction/lower urinary tract symptoms 01/04/2017   CAD (coronary artery disease) 04/02/2013   Cath 2012 moderate mid LAD lesion, otherwise not much disease    Diabetes ([place]) 01/18/2018   Dilated aortic root (HCC)    aortic root 44 mm, ascending aorta 46 mm 12/22/21 echo   Encounter for long-term (current) use of other medications 07/27/2013   Essential hypertension, benign    First degree heart block    Heart murmur    mild-moderate AS and AR 01/11/22   History of bilateral knee replacement 02/07/2017   History of bladder  stone    History of kidney stones    History of melanoma excision    June 2016  --- s/p  excision right  leg (per pt localized and no recurrence)   Hypertensive heart disease 04/02/2013   Impingement syndrome of right shoulder 01/29/2020   Impotence of organic origin    Left-sided low back pain with left-sided sciatica 01/23/2019   Long term current use of anticoagulant therapy    Mixed hyperlipidemia    slightly   Multiple renal cysts    bilateral   Nephrolithiasis 05/17/2014   Nonischemic cardiomyopathy (HCC)    Osteoarthritis    PAF (paroxysmal atrial fibrillation) ([place])    followed by cardiology  (takes ASA)   RBBB    RBBB (right bundle branch block with left anterior fascicular block)  04/02/2013   Renal oncocytoma of left kidney 06/18/2013   s/p left robotic assited laparoscopic partial nephrectomy 06/18/13   Right ureteral stone    Rotator cuff syndrome of left shoulder 04/17/2016   Status post total left knee replacement 01/01/2020   Tendinopathy of right biceps tendon 01/29/2020   Tendinopathy of right rotator cuff 01/29/2020   Type 2 diabetes mellitus treated with insulin [facility])    endocrinologist-  dr Loanne Drilling   Wears glasses    Past Surgical History:  Procedure Laterality Date   A-FLUTTER ABLATION N/A 10/27/2021   Procedure: A-FLUTTER ABLATION;  Surgeon: Vickie Epley, MD;  Location: [facility];  Service: Cardiovascular;  Laterality: N/A;   CARDIAC CATHETERIZATION  08-17-2011  DR Frederico Hamman TILLEY   POSSIBLE MODERATE LAD STENOSIS JUST AFTER THE BIFURCATION OF LARGE DIAGONAL BRANCH/ LVEF  40-45%/ MILD GLOBAL HYPODINESIS (CATH DONE FOR ABNORMAL STRESS TEST OF FIXED LATERAL WALL DEFECT & BIFASCICULAR BLAOCK)   CARDIOVERSION N/A 04/02/2013   Procedure: CARDIOVERSION;  Surgeon: Jacolyn Reedy, MD;  Location: [place];  Service: Cardiovascular;  Laterality: N/A;   CARDIOVERSION N/A 12/27/2020   Procedure: CARDIOVERSION;  Surgeon: Skeet Latch, MD;  Location: [place];  Service: Cardiovascular;  Laterality: N/A;   CYSTOSCOPY WITH LITHOLAPAXY N/A 05/18/2015   Procedure: CYSTOSCOPY WITH LITHOLAPAXY;  Surgeon: Rana Snare, MD;  Location: [facility];  Service: Urology;  Laterality: N/A;   CYSTOSCOPY WITH LITHOLAPAXY N/A 01/04/2017   Procedure: CYSTOSCOPY WITH LITHOLAPAXY, Holmium Laser;  Surgeon: Ardis Hughs, MD;  Location: [facility];  Service: Urology;  Laterality: N/A;   CYSTOSCOPY WITH RETROGRADE PYELOGRAM, URETEROSCOPY AND STENT PLACEMENT  11/03/2012   Procedure: CYSTOSCOPY WITH RETROGRADE PYELOGRAM, URETEROSCOPY AND STENT PLACEMENT;  Surgeon: Bernestine Amass, MD;  Location: [facility];  Service: Urology;  Laterality: Left;   Cystoscopy/Left Retrograde/Ureteroscopy/Holmium Laser Litho/Double J Stent   CYSTOSCOPY WITH URETEROSCOPY, STONE BASKETRY AND STENT PLACEMENT Right 05/15/2019   Procedure: CYSTOSCOPY WITH URETEROSCOPY, LASER LITHOTRIPSY STONE BASKETRY AND STENT PLACEMENT, RIGHT RETROGRADE;  Surgeon: Ardis Hughs, MD;  Location: [facility];  Service: Urology;  Laterality: Right;   HOLMIUM LASER APPLICATION  14/48/1856   Procedure: HOLMIUM LASER APPLICATION;  Surgeon: Bernestine Amass, MD;  Location: [facility];  Service: Urology;  Laterality: Left;   HOLMIUM LASER APPLICATION N/A 01/30/9701   Procedure: HOLMIUM LASER APPLICATION;  Surgeon: Rana Snare, MD;  Location: [facility];  Service: Urology;  Laterality: N/A;   KNEE SURGERY Bilateral [place] Bilateral June 2016   20th-- left leg //  1st-- right leg w/ skin graft from right thigh (  no lymph node bx)   NEPHROLITHOTOMY Right 05/17/2014   Procedure: NEPHROLITHOTOMY PERCUTANEOUS;  Surgeon: Bernestine Amass, MD;  Location: [facility];  Service: Urology;  Laterality: Right;   PILONIDAL CYST EXCISION     ROBOTIC ASSITED PARTIAL NEPHRECTOMY Left 06/18/2013   Procedure: ROBOTIC ASSISTED PARTIAL NEPHRECTOMY;  Surgeon: Dutch Gray, MD;  Location: [facility];  Service: Urology;  Laterality: Left;   ROTATOR CUFF REPAIR Left 07/2016   SHOULDER ARTHROSCOPY WITH ROTATOR CUFF REPAIR AND SUBACROMIAL DECOMPRESSION Right 05/27/2020   Procedure: RIGHT SHOULDER ARTHROSCOPY WITH EXTENSIVE DEBRIDEMENT, DISTAL CLAVICLE EXCISION AND SUBACROMIAL DECOMPRESSION;  Surgeon: Leandrew Koyanagi, MD;  Location: [place];  Service: Orthopedics;  Laterality: Right;   TEE WITH CARDIOVERSION  12/27/2020   TEE WITHOUT CARDIOVERSION N/A 12/27/2020   Procedure: TRANSESOPHAGEAL ECHOCARDIOGRAM (TEE);  Surgeon: Skeet Latch, MD;  Location: [facility];  Service: Cardiovascular;  Laterality: N/A;   TONSILLECTOMY  43  (age  71)   TOTAL KNEE ARTHROPLASTY Left 02/07/2017   Procedure: LEFT TOTAL KNEE ARTHROPLASTY;  Surgeon: Leandrew Koyanagi, MD;  Location: [place];  Service: Orthopedics;  Laterality: Left;   TOTAL KNEE ARTHROPLASTY Right 04/25/2017   Procedure: RIGHT TOTAL KNEE ARTHROPLASTY;  Surgeon: Leandrew Koyanagi, MD;  Location: [place];  Service: Orthopedics;  Laterality: Right;   TOTAL KNEE REVISION Left 07/13/2022   Procedure: LEFT TOTAL KNEE REVISION, TIBIAL COMPONENT;  Surgeon: Leandrew Koyanagi, MD;  Location: [place];  Service: Orthopedics;  Laterality: Left;   TRANSURETHRAL RESECTION OF PROSTATE N/A 01/04/2017   Procedure: TRANSURETHRAL RESECTION OF THE PROSTATE (TURP);  Surgeon: Ardis Hughs, MD;  Location: [facility];  Service: Urology;  Laterality: N/A;   Social History   Socioeconomic History   Marital status: Married    Spouse name: Not on file   Number of children: 2   Years of education: 16   Highest education level: Not on file  Occupational History   Occupation: Government social research officer, Psychologist, occupational  Tobacco Use   Smoking status: Former    Packs/day: 1.00    Years: 6.00    Total pack years: 6.00    Types: Cigarettes    Quit date: 10/31/1969    Years since quitting: 53.0   Smokeless tobacco: Never  Vaping Use   Vaping Use: Never used  Substance and Sexual Activity   Alcohol use: Yes    Alcohol/week: 0.0 standard drinks of alcohol    Comment: OCCASIONAL   Drug use: No   Sexual activity: Never  Other Topics Concern   Not on file  Social History Narrative   Regular exercise-no   Social Determinants of Health   Financial Resource Strain: Not on file  Food Insecurity: Not on file  Transportation Needs: Not on file  Physical Activity: Not on file  Stress: Not on file  Social Connections: Not on file  Intimate Partner Violence: Not on file   Current Outpatient Medications on File Prior to Visit  Medication Sig Dispense Refill   dapagliflozin propanediol (FARXIGA) 10 MG TABS tablet Take 1 tablet  (10 mg total) by mouth daily before breakfast. 90 tablet 3   Insulin Glargine (BASAGLAR KWIKPEN) 100 UNIT/ML Inject 80 Units into the skin every morning. And pen needles 1/day (Patient taking differently: Inject 90 Units into the skin in the morning. And pen needles 1/day) 90 mL 3   Insulin Pen Needle (PEN NEEDLES) 30G X 8 MM MISC 1 each by Does not apply route daily. E11.9 90 each 0  Lancets (ONETOUCH DELICA PLUS GGYIRS85I) MISC 1 each by Other route 2 (two) times daily. E11.9 200 each 3   methocarbamol (ROBAXIN) 750 MG tablet Take 1 tablet (750 mg total) by mouth 2 (two) times daily as needed for muscle spasms. (Patient not taking: Reported on 10/24/2022) 30 tablet 3   metoprolol succinate (TOPROL-XL) 50 MG 24 hr tablet TAKE 1 TABLET DAILY, TAKE WITH OR IMMEDIATELY FOLLOWING A MEAL 90 tablet 3   nitroGLYCERIN (NITROSTAT) 0.4 MG SL tablet Place 0.4 mg under the tongue every 5 (five) minutes x 3 doses as needed for chest pain.     ONETOUCH ULTRA test strip USE 1 STRIP TWICE A DAY 200 strip 3   Rivaroxaban (XARELTO) 15 MG TABS tablet Take 1 tablet (15 mg total) by mouth 2 (two) times daily for 19 days. 8/29 to 9/17 - take '15mg'$  by mouth twice daily Then on 9/18 - decrease to previous home dose of '20mg'$  daily 38 tablet 0   rosuvastatin (CRESTOR) 20 MG tablet TAKE 1 TABLET DAILY 90 tablet 3   sacubitril-valsartan (ENTRESTO) 97-103 MG Take 1 tablet by mouth 2 (two) times daily. 180 tablet 3   Semaglutide (RYBELSUS) 3 MG TABS TAKE 1 TABLET (3 MG) BY MOUTH DAILY. (Patient not taking: Reported on 10/24/2022) 30 tablet 0   spironolactone (ALDACTONE) 25 MG tablet TAKE ONE-HALF (1/2) TABLET DAILY 45 tablet 3   trolamine salicylate (ASPERCREME) 10 % cream Apply 1 application  topically 3 (three) times daily as needed for muscle pain.     XARELTO 20 MG TABS tablet Take 20 mg by mouth daily.     No current facility-administered medications on file prior to visit.   Allergies  Allergen Reactions   Actos  [Pioglitazone] Swelling and Cough    SWELLING REACTION UNSPECIFIED  EDEMA   Family History  Problem Relation Age of Onset   Arthritis Father    Hypertension Father    Diabetes Father    Arthritis Mother    Hypertension Mother    Heart attack Brother 56   Diabetes Brother    Lymphoma Paternal Grandfather    Cancer Maternal Uncle    Cancer Paternal Uncle        type unknown   PE: BP (!) 144/78 (BP Location: Right Arm, Patient Position: Sitting, Cuff Size: Normal)   Pulse (!) 58   Ht '5\' 11"'$  (1.803 m)   Wt 222 lb (100.7 kg)   SpO2 98%   BMI 30.96 kg/m  Wt Readings from Last 3 Encounters:  11/15/22 222 lb (100.7 kg)  10/24/22 221 lb (100.2 kg)  09/07/22 221 lb 3.2 oz (100.3 kg)   Constitutional: overweight, in NAD Eyes: no exophthalmos ENT: no thyromegaly, no cervical lymphadenopathy Cardiovascular: RRR, No RG, + 1/6 SEM Respiratory: CTA B Musculoskeletal: no deformities Skin: + rashes - stasis dermatitis on B legs Neurological: no tremor with outstretched hands Diabetic Foot Exam - Simple   Simple Foot Form Diabetic Foot exam was performed with the following findings: Yes 11/15/2022  8:15 AM  Visual Inspection No deformities, no ulcerations, no other skin breakdown bilaterally: Yes Sensation Testing Intact to touch and monofilament testing bilaterally: Yes Pulse Check Posterior Tibialis and Dorsalis pulse intact bilaterally: Yes Comments Purplish discoloration of B feet Shiny, thin skin B Onychodystrophy    ASSESSMENT: 1. DM2, insulin-dependent, uncontrolled, with complications - CAd - NICMP - mild CKD  No family history of medullary thyroid cancer or personal history of pancreatitis.  2. HL  PLAN:  1. Patient with longstanding, uncontrolled, type 2 diabetes, on an oral antidiabetic regimen with SGLT2 inhibitor and also basal insulin.  HbA1c at last visit was lower, at 7.4%.  At last visit, the majority of his blood sugars were fluctuating within the  target range in the morning, or slightly above.  He was not checking sugars later in the day and I strongly advised him to start.  We added Rybelsus and discussed about the possibility of starting to decrease Basaglar if sugars improved.  I cleared him for knee surgery, which he had since last visit. -At today's visit, he tells me that he started Rybelsus and tolerated it well, but a refill was not approved by the office.  I advised him that we did not receive the request.  At today's visit, sugars are higher as he also relaxed his diet over the holidays.  Rybelsus would likely not be enough to help with his very high blood sugars (up to higher 200s in the morning -he is not checking later in the day as he mentions that he does not have time).  Therefore, I suggested to start Ozempic and increase the dose as tolerated.  Will also try to switch from [place] to [employer]. -He plans to retire in approximately 4 months.  This may help with diabetes control - I suggested to:  Patient Instructions  Please continue: - Farxiga 10 mg in am  Try to change from Basaglar to: - Tresiba 90 units daily  Please start: - Ozempic 0.25 mg weekly in a.m. (for example on Sunday morning) x 4 weeks, then increase to 0.5 mg weekly in a.m. if no nausea or hypoglycemia.  Please return in 3-4 months with your sugar log.   - we checked his HbA1c: 8.7% (higher) - advised to check sugars at different times of the day - 1x a day, rotating check times - advised for yearly eye exams >> he is UTD - return to clinic in 3-4 months  2. HL -Reviewed his latest lipid panel from 08/2022: LDL close to our target of 55 or less due to history of cardiovascular disease, HDL slightly low: Lab Results  Component Value Date   CHOL 114 09/07/2022   HDL 36 (L) 09/07/2022   LDLCALC 57 09/07/2022   TRIG 112 09/07/2022   CHOLHDL 3.2 09/07/2022  -He continues on Crestor 20 mg daily without side effects  Philemon Kingdom, MD  PhD [facility] Endocrinology

## 2022-12-26 ENCOUNTER — Other Ambulatory Visit: Payer: Self-pay | Admitting: Urology

## 2022-12-26 DIAGNOSIS — R972 Elevated prostate specific antigen [PSA]: Secondary | ICD-10-CM

## 2023-01-04 ENCOUNTER — Encounter (HOSPITAL_COMMUNITY): Payer: Self-pay

## 2023-01-04 ENCOUNTER — Ambulatory Visit (HOSPITAL_COMMUNITY)
Admission: EM | Admit: 2023-01-04 | Discharge: 2023-01-04 | Disposition: A | Discharge status: Home / Self Care | Payer: BC Managed Care – PPO | Attending: Family Medicine | Admitting: Family Medicine

## 2023-01-04 DIAGNOSIS — S80822A Blister (nonthermal), left lower leg, initial encounter: Secondary | ICD-10-CM

## 2023-01-04 DIAGNOSIS — S81812A Laceration without foreign body, left lower leg, initial encounter: Secondary | ICD-10-CM

## 2023-01-04 NOTE — ED Triage Notes (Signed)
Chief Complaint: mss that has been deranging on the left calf.   Onset: draining for 2-3 weeks.   Prescriptions or OTC medications tried: Yes- neosporin    with little relief

## 2023-01-04 NOTE — ED Provider Notes (Signed)
[place]    CSN: [id number] Arrival date & time: 01/04/23  0805      History   Chief Complaint Chief Complaint  Patient presents with   Abscess    HPI Matthew Schmitt is a 72 y.o. male.   Patient has a sore at the left lateral leg.  It was a blister that formed after getting hit by some thing.  This usually heals on its own, but this is not healing.  He has had this at least 3 weeks.  There is a center portion of the blister than continues to leak.  Light yellow in color.  No pain/discomfort.  No redness per se.  No fevers/chills.        Past Medical History:  Diagnosis Date   Anxiety    Arthritis    everywhere   Arthrosis of right acromioclavicular joint 01/29/2020   Atrial flutter [facility])    Onset May 2014 Cardioversion done    BPH associated with nocturia    BPH with obstruction/lower urinary tract symptoms 01/04/2017   CAD (coronary artery disease) 04/02/2013   Cath 2012 moderate mid LAD lesion, otherwise not much disease    Diabetes ([place]) 01/18/2018   Dilated aortic root (HCC)    aortic root 44 mm, ascending aorta 46 mm 12/22/21 echo   Encounter for long-term (current) use of other medications 07/27/2013   Essential hypertension, benign    First degree heart block    Heart murmur    mild-moderate AS and AR 01/11/22   History of bilateral knee replacement 02/07/2017   History of bladder stone    History of kidney stones    History of melanoma excision    June 2016  --- s/p  excision right  leg (per pt localized and no recurrence)   Hypertensive heart disease 04/02/2013   Impingement syndrome of right shoulder 01/29/2020   Impotence of organic origin    Left-sided low back pain with left-sided sciatica 01/23/2019   Long term current use of anticoagulant therapy    Mixed hyperlipidemia    slightly   Multiple renal cysts    bilateral   Nephrolithiasis 05/17/2014   Nonischemic cardiomyopathy (HCC)    Osteoarthritis    PAF (paroxysmal atrial  fibrillation) ([place])    followed by cardiology  (takes ASA)   RBBB    RBBB (right bundle branch block with left anterior fascicular block) 04/02/2013   Renal oncocytoma of left kidney 06/18/2013   s/p left robotic assited laparoscopic partial nephrectomy 06/18/13   Right ureteral stone    Rotator cuff syndrome of left shoulder 04/17/2016   Status post total left knee replacement 01/01/2020   Tendinopathy of right biceps tendon 01/29/2020   Tendinopathy of right rotator cuff 01/29/2020   Type 2 diabetes mellitus treated with insulin [facility])    endocrinologist-  dr Loanne Drilling   Wears glasses     Patient Active Problem List   Diagnosis Date Noted   Status post revision of total replacement of left knee 07/13/2022   S/P revision of total knee, left 07/13/2022   Hypercoagulable state due to atrial flutter ([place]) 02/20/2022   Preop cardiovascular exam 02/20/2022   Loose left total knee arthroplasty ([place]) [id number]   Chronic systolic heart failure ([place]) 08/18/2021   Aortic stenosis 08/18/2021   Chronic kidney disease, stage 3a ([place]) 08/18/2021   Dilated aortic root ([place]) 08/18/2021   Bicuspid aortic valve 08/18/2021   AKI (acute kidney injury) ([place]) 12/29/2020   Hypokalemia 12/29/2020  Obstructive sleep apnea 12/29/2020   Thrombocytopenia ([place]) 12/29/2020   Hematuria 12/29/2020   Wears glasses    Type 2 diabetes mellitus treated with insulin ([place])    Right ureteral stone    RBBB    PAF (paroxysmal atrial fibrillation) ([place])    Multiple renal cysts    Impotence of organic origin    History of melanoma excision    History of kidney stones    History of bladder stone    Heart murmur    First degree heart block    Hypertension    BPH associated with nocturia    Arthritis    Anxiety    Tendinopathy of right rotator cuff 01/29/2020   Tendinopathy of right biceps tendon 01/29/2020   Impingement syndrome of right shoulder 01/29/2020   Arthrosis of right acromioclavicular joint  01/29/2020   Chronic right shoulder pain 01/01/2020   Status post total left knee replacement 01/01/2020   Left-sided low back pain with left-sided sciatica 01/23/2019   Poorly controlled type 2 diabetes mellitus with circulatory disorder ([place]) 01/18/2018   History of bilateral knee replacement 02/07/2017   Primary osteoarthritis of left knee    History of renal cell carcinoma 01/2017   BPH with obstruction/lower urinary tract symptoms 01/04/2017   Rotator cuff syndrome of left shoulder 04/17/2016   Nephrolithiasis 05/17/2014   Encounter for long-term (current) use of other medications 07/27/2013   Long term current use of anticoagulant therapy    CAD (coronary artery disease) 04/02/2013   Hypertensive heart disease 04/02/2013   RBBB (right bundle branch block with left anterior fascicular block) 04/02/2013   Atrial flutter (HCC)    Nonischemic cardiomyopathy (HCC)    Obesity (BMI 30-39.9)    History of kidney stones    Osteoarthritis    Hyperlipidemia     Past Surgical History:  Procedure Laterality Date   A-FLUTTER ABLATION N/A 10/27/2021   Procedure: A-FLUTTER ABLATION;  Surgeon: Vickie Epley, MD;  Location: [place] [facility];  Service: Cardiovascular;  Laterality: N/A;   CARDIAC CATHETERIZATION  08-17-2011  DR Frederico Hamman [place]   POSSIBLE MODERATE LAD STENOSIS JUST AFTER THE BIFURCATION OF LARGE DIAGONAL BRANCH/ LVEF  40-45%/ MILD GLOBAL HYPODINESIS (CATH DONE FOR ABNORMAL STRESS TEST OF FIXED LATERAL WALL DEFECT & BIFASCICULAR BLAOCK)   CARDIOVERSION N/A 04/02/2013   Procedure: CARDIOVERSION;  Surgeon: Jacolyn Reedy, MD;  Location: [place];  Service: Cardiovascular;  Laterality: N/A;   CARDIOVERSION N/A 12/27/2020   Procedure: CARDIOVERSION;  Surgeon: Skeet Latch, MD;  Location: [place];  Service: Cardiovascular;  Laterality: N/A;   CYSTOSCOPY WITH LITHOLAPAXY N/A 05/18/2015   Procedure: CYSTOSCOPY WITH LITHOLAPAXY;  Surgeon: Rana Snare, MD;  Location: [facility];  Service: Urology;  Laterality: N/A;   CYSTOSCOPY WITH LITHOLAPAXY N/A 01/04/2017   Procedure: CYSTOSCOPY WITH LITHOLAPAXY, Holmium Laser;  Surgeon: Ardis Hughs, MD;  Location: [facility];  Service: Urology;  Laterality: N/A;   CYSTOSCOPY WITH RETROGRADE PYELOGRAM, URETEROSCOPY AND STENT PLACEMENT  11/03/2012   Procedure: CYSTOSCOPY WITH RETROGRADE PYELOGRAM, URETEROSCOPY AND STENT PLACEMENT;  Surgeon: Bernestine Amass, MD;  Location: [facility];  Service: Urology;  Laterality: Left;  Cystoscopy/Left Retrograde/Ureteroscopy/Holmium Laser Litho/Double J Stent   CYSTOSCOPY WITH URETEROSCOPY, STONE BASKETRY AND STENT PLACEMENT Right 05/15/2019   Procedure: CYSTOSCOPY WITH URETEROSCOPY, LASER LITHOTRIPSY STONE BASKETRY AND STENT PLACEMENT, RIGHT RETROGRADE;  Surgeon: Ardis Hughs, MD;  Location: [facility];  Service: Urology;  Laterality: Right;   HOLMIUM  LASER APPLICATION  [id number]   Procedure: HOLMIUM LASER APPLICATION;  Surgeon: Bernestine Amass, MD;  Location: [facility];  Service: Urology;  Laterality: Left;   HOLMIUM LASER APPLICATION N/A [id number]   Procedure: HOLMIUM LASER APPLICATION;  Surgeon: Rana Snare, MD;  Location: [facility];  Service: Urology;  Laterality: N/A;   KNEE SURGERY Bilateral [place] Bilateral June 2016   20th-- left leg //  1st-- right leg w/ skin graft from right thigh (no lymph node bx)   NEPHROLITHOTOMY Right 05/17/2014   Procedure: NEPHROLITHOTOMY PERCUTANEOUS;  Surgeon: Bernestine Amass, MD;  Location: [facility];  Service: Urology;  Laterality: Right;   PILONIDAL CYST EXCISION     ROBOTIC ASSITED PARTIAL NEPHRECTOMY Left 06/18/2013   Procedure: ROBOTIC ASSISTED PARTIAL NEPHRECTOMY;  Surgeon: Dutch Gray, MD;  Location: [facility];  Service: Urology;  Laterality: Left;   ROTATOR CUFF REPAIR Left 07/2016   SHOULDER ARTHROSCOPY WITH ROTATOR CUFF REPAIR AND SUBACROMIAL  DECOMPRESSION Right 05/27/2020   Procedure: RIGHT SHOULDER ARTHROSCOPY WITH EXTENSIVE DEBRIDEMENT, DISTAL CLAVICLE EXCISION AND SUBACROMIAL DECOMPRESSION;  Surgeon: Leandrew Koyanagi, MD;  Location: [place];  Service: Orthopedics;  Laterality: Right;   TEE WITH CARDIOVERSION  12/27/2020   TEE WITHOUT CARDIOVERSION N/A 12/27/2020   Procedure: TRANSESOPHAGEAL ECHOCARDIOGRAM (TEE);  Surgeon: Skeet Latch, MD;  Location: [facility];  Service: Cardiovascular;  Laterality: N/A;   TONSILLECTOMY  75  (age 23)   TOTAL KNEE ARTHROPLASTY Left 02/07/2017   Procedure: LEFT TOTAL KNEE ARTHROPLASTY;  Surgeon: Leandrew Koyanagi, MD;  Location: [place];  Service: Orthopedics;  Laterality: Left;   TOTAL KNEE ARTHROPLASTY Right 04/25/2017   Procedure: RIGHT TOTAL KNEE ARTHROPLASTY;  Surgeon: Leandrew Koyanagi, MD;  Location: [place];  Service: Orthopedics;  Laterality: Right;   TOTAL KNEE REVISION Left 07/13/2022   Procedure: LEFT TOTAL KNEE REVISION, TIBIAL COMPONENT;  Surgeon: Leandrew Koyanagi, MD;  Location: [place];  Service: Orthopedics;  Laterality: Left;   TRANSURETHRAL RESECTION OF PROSTATE N/A 01/04/2017   Procedure: TRANSURETHRAL RESECTION OF THE PROSTATE (TURP);  Surgeon: Ardis Hughs, MD;  [facility];  Service: Urology;  Laterality: N/A;       Home Medications    Prior to Admission medications   Medication Sig Start Date End Date Taking? Authorizing Provider  dapagliflozin propanediol (FARXIGA) 10 MG TABS tablet Take 1 tablet (10 mg total) by mouth daily before breakfast. 03/30/22  Yes Skains, Thana Farr, MD  insulin degludec (TRESIBA FLEXTOUCH) 200 UNIT/ML FlexTouch Pen Inject 90 Units into the skin daily. 11/15/22  Yes Philemon Kingdom, MD  metoprolol succinate (TOPROL-XL) 50 MG 24 hr tablet TAKE 1 TABLET DAILY, TAKE WITH OR IMMEDIATELY FOLLOWING A MEAL 03/30/22  Yes Jerline Pain, MD  rosuvastatin (CRESTOR) 20 MG tablet TAKE 1 TABLET DAILY 02/09/22  Yes Jerline Pain, MD   sacubitril-valsartan (ENTRESTO) 97-103 MG Take 1 tablet by mouth 2 (two) times daily. 09/07/22  Yes Conte, Tessa N, PA-C  Semaglutide,0.25 or 0.5MG/DOS, 2 MG/3ML SOPN Inject 0.5 mg into the skin once a week. 11/15/22  Yes Philemon Kingdom, MD  spironolactone (ALDACTONE) 25 MG tablet TAKE ONE-HALF (1/2) TABLET DAILY 02/09/22  Yes Jerline Pain, MD  trolamine salicylate (ASPERCREME) 10 % cream Apply 1 application  topically 3 (three) times daily as needed for muscle pain.   Yes [provider]  XARELTO 20 MG TABS tablet Take 20 mg by mouth daily. 09/27/22  Yes [provider]  Insulin Glargine (BASAGLAR KWIKPEN) 100 UNIT/ML Inject 80 Units into the skin every morning. And pen needles 1/day Patient taking differently: Inject 90 Units into the skin in the morning. And pen needles 1/day 01/05/22   Renato Shin, MD  Insulin Pen Needle (PEN NEEDLES) 30G X 8 MM MISC 1 each by Does not apply route daily. E11.9 07/07/20   Renato Shin, MD  Lancets [facility] DELICA PLUS [id number]) [place] 1 each by Other route 2 (two) times daily. E11.9 07/07/20   Renato Shin, MD  methocarbamol (ROBAXIN) 750 MG tablet Take 1 tablet (750 mg total) by mouth 2 (two) times daily as needed for muscle spasms. Patient not taking: Reported on 10/24/2022 07/17/22   Aundra Dubin, PA-C  nitroGLYCERIN (NITROSTAT) 0.4 MG SL tablet Place 0.4 mg under the tongue every 5 (five) minutes x 3 doses as needed for chest pain.    [provider]  [facility] ULTRA test strip USE 1 STRIP TWICE A DAY 03/30/22   Shamleffer, Melanie Crazier, MD    Family History Family History  Problem Relation Age of Onset   Arthritis Father    Hypertension Father    Diabetes Father    Arthritis Mother    Hypertension Mother    Heart attack Brother 64   Diabetes Brother    Lymphoma Paternal Grandfather    Cancer Maternal Uncle    Cancer Paternal Uncle        type unknown    Social History Social History   Tobacco Use    Smoking status: Former    Packs/day: 1.00    Years: 6.00    Total pack years: 6.00    Types: Cigarettes    Quit date: 10/31/1969    Years since quitting: 53.2   Smokeless tobacco: Never  Vaping Use   Vaping Use: Never used  Substance Use Topics   Alcohol use: Yes    Alcohol/week: 0.0 standard drinks of alcohol    Comment: OCCASIONAL   Drug use: No     Allergies   Actos [pioglitazone]   Review of Systems Review of Systems  Constitutional: Negative.   HENT: Negative.    Respiratory: Negative.    Cardiovascular: Negative.   Gastrointestinal: Negative.   Musculoskeletal: Negative.   Skin:  Positive for wound.     Physical Exam Triage Vital Signs ED Triage Vitals  Enc Vitals Group     BP 01/04/23 0841 (!) 176/82     Pulse Rate 01/04/23 0841 67     Resp 01/04/23 0841 16     Temp 01/04/23 0841 97.8 F (36.6 C)     Temp Source 01/04/23 0841 Oral     SpO2 01/04/23 0841 98 %     Weight 01/04/23 0841 222 lb (100.7 kg)     Height 01/04/23 0841 5' 11"$  (1.803 m)     Head Circumference --      Peak Flow --      Pain Score 01/04/23 0839 0     Pain Loc --      Pain Edu? --      Excl. in [place]? --    No data found.  Updated Vital Signs BP (!) 176/82 (BP Location: Left Arm)   Pulse 67   Temp 97.8 F (36.6 C) (Oral)   Resp 16   Ht 5' 11"$  (1.803 m)   Wt 100.7 kg   SpO2 98%   BMI 30.96 kg/m   Visual Acuity Right Eye Distance:  Left Eye Distance:   Bilateral Distance:    Right Eye Near:   Left Eye Near:    Bilateral Near:     Physical Exam Constitutional:      Appearance: Normal appearance.  Cardiovascular:     Rate and Rhythm: Normal rate and regular rhythm.     Comments: 1+ edema bilaterally Pulmonary:     Effort: Pulmonary effort is normal.  Skin:    Comments: There is a healing blister at the left lateral lower leg;  there is a small, eraser sized opening still present that is draining clear fluid;  no erythema noted, nor warmth or fluctuance noted   Neurological:     General: No focal deficit present.     Mental Status: He is alert.  Psychiatric:        Mood and Affect: Mood normal.      UC Treatments / Results  Labs (all labs ordered are listed, but only abnormal results are displayed) Labs Reviewed - No data to display  EKG   Radiology No results found.  Procedures Procedures (including critical care time)  Medications Ordered in UC Medications - No data to display  Initial Impression / Assessment and Plan / UC Course  I have reviewed the triage vital signs and the nursing notes.  Pertinent labs & imaging results that were available during my care of the patient were reviewed by me and considered in my medical decision making (see chart for details).   Patient was seen today for non-healing wound on leg.  This appears to be a portion of the blister that has not healed.  No sign of infection at this time.  Clear fluid only.  I have placed a xeroform dressing on the leg.  Advised to change this daily for now.  Discussed that if this does not continue to heal he may need to see wound care for further treatment.   Final Clinical Impressions(s) / UC Diagnoses   Final diagnoses:  Skin tear of left lower leg without complication, initial encounter     Discharge Instructions      You were seen today for a non healing skin blister.  I have dressed this with a xeroform dressing, along with a non-stick dressing over this.  I recommend you change the xeroform daily if possible.  If this continues to not heal, then you may need to see wound care.  Please follow up with your primary care provider in regards to this if needed.     ED Prescriptions   None    PDMP not reviewed this encounter.   Rondel Oh, MD 01/04/23 0930

## 2023-01-04 NOTE — Discharge Instructions (Signed)
You were seen today for a non healing skin blister.  I have dressed this with a xeroform dressing, along with a non-stick dressing over this.  I recommend you change the xeroform daily if possible.  If this continues to not heal, then you may need to see wound care.  Please follow up with your primary care provider in regards to this if needed.

## 2023-01-11 ENCOUNTER — Encounter: Payer: Self-pay | Admitting: Orthopaedic Surgery

## 2023-01-11 ENCOUNTER — Ambulatory Visit (INDEPENDENT_AMBULATORY_CARE_PROVIDER_SITE_OTHER): Payer: BC Managed Care – PPO

## 2023-01-11 ENCOUNTER — Ambulatory Visit: Payer: BC Managed Care – PPO | Admitting: Orthopaedic Surgery

## 2023-01-11 DIAGNOSIS — Z0189 Encounter for other specified special examinations: Secondary | ICD-10-CM

## 2023-01-11 DIAGNOSIS — Z96652 Presence of left artificial knee joint: Secondary | ICD-10-CM

## 2023-01-11 DIAGNOSIS — T84033D Mechanical loosening of internal left knee prosthetic joint, subsequent encounter: Secondary | ICD-10-CM

## 2023-01-11 NOTE — Progress Notes (Signed)
Office Visit Note   Patient: Matthew Schmitt           Date of Birth: 1951/06/21           MRN: [id number] Visit Date: 01/11/2023              Requested by: Leonides Sake, MD [place],  [place] [id number] PCP: Leonides Sake, MD   Assessment & Plan: Visit Diagnoses:  1. S/P TKR (total knee replacement), left     Plan: Mr. Fenstermaker is doing well from his revision surgery.  At this point we will see him back in another 6 months for recheck of his left knee.  He will need 2 view x-rays of the left knee at that time  Follow-Up Instructions: Return in about 6 months (around 07/12/2023).   Orders:  Orders Placed This Encounter  Procedures   XR Knee 1-2 Views Left   No orders of the defined types were placed in this encounter.     Procedures: No procedures performed   Clinical Data: No additional findings.   Subjective: Chief Complaint  Patient presents with   Left Knee - Follow-up    Left total knee revision 07/13/2022    HPI  Mr. Ulin is following up today 6 months status post revision of tibial baseplate.  He states that the left knee is doing okay and the swelling is better.  He is dealing with a wound on his left leg and is currently being worked up for prostate cancer.  Review of Systems   Objective: Vital Signs: There were no vitals taken for this visit.  Physical Exam  Ortho Exam  Left knee exam shows fully healed surgical scar with a small joint effusion.  Range of motion is at baseline.  Good varus valgus stability.  No signs of infection.  Specialty Comments:  No specialty comments available.  Imaging: No results found.   PMFS History: Patient Active Problem List   Diagnosis Date Noted   Status post revision of total replacement of left knee 07/13/2022   S/P revision of total knee, left 07/13/2022   Hypercoagulable state due to atrial flutter ([place]) 02/20/2022   Preop cardiovascular exam 02/20/2022   Loose left total knee  arthroplasty ([place]) [id number]   Chronic systolic heart failure ([place]) 08/18/2021   Aortic stenosis 08/18/2021   Chronic kidney disease, stage 3a ([place]) 08/18/2021   Dilated aortic root ([place]) 08/18/2021   Bicuspid aortic valve 08/18/2021   AKI (acute kidney injury) ([place]) 12/29/2020   Hypokalemia 12/29/2020   Obstructive sleep apnea 12/29/2020   Thrombocytopenia ([place]) 12/29/2020   Hematuria 12/29/2020   Wears glasses    Type 2 diabetes mellitus treated with insulin ([place])    Right ureteral stone    RBBB    PAF (paroxysmal atrial fibrillation) ([place])    Multiple renal cysts    Impotence of organic origin    History of melanoma excision    History of kidney stones    History of bladder stone    Heart murmur    First degree heart block    Hypertension    BPH associated with nocturia    Arthritis    Anxiety    Tendinopathy of right rotator cuff 01/29/2020   Tendinopathy of right biceps tendon 01/29/2020   Impingement syndrome of right shoulder 01/29/2020   Arthrosis of right acromioclavicular joint 01/29/2020   Chronic right shoulder pain 01/01/2020   Status post total left knee replacement  01/01/2020   Left-sided low back pain with left-sided sciatica 01/23/2019   Poorly controlled type 2 diabetes mellitus with circulatory disorder ([place]) 01/18/2018   History of bilateral knee replacement 02/07/2017   Primary osteoarthritis of left knee    History of renal cell carcinoma 01/2017   BPH with obstruction/lower urinary tract symptoms 01/04/2017   Rotator cuff syndrome of left shoulder 04/17/2016   Nephrolithiasis 05/17/2014   Encounter for long-term (current) use of other medications 07/27/2013   Long term current use of anticoagulant therapy    CAD (coronary artery disease) 04/02/2013   Hypertensive heart disease 04/02/2013   RBBB (right bundle branch block with left anterior fascicular block) 04/02/2013   Atrial flutter (HCC)    Nonischemic cardiomyopathy (HCC)    Obesity (BMI  30-39.9)    History of kidney stones    Osteoarthritis    Hyperlipidemia    Past Medical History:  Diagnosis Date   Anxiety    Arthritis    everywhere   Arthrosis of right acromioclavicular joint 01/29/2020   Atrial flutter [facility])    Onset May 2014 Cardioversion done    BPH associated with nocturia    BPH with obstruction/lower urinary tract symptoms 01/04/2017   CAD (coronary artery disease) 04/02/2013   Cath 2012 moderate mid LAD lesion, otherwise not much disease    Diabetes ([place]) 01/18/2018   Dilated aortic root (HCC)    aortic root 44 mm, ascending aorta 46 mm 12/22/21 echo   Encounter for long-term (current) use of other medications 07/27/2013   Essential hypertension, benign    First degree heart block    Heart murmur    mild-moderate AS and AR 01/11/22   History of bilateral knee replacement 02/07/2017   History of bladder stone    History of kidney stones    History of melanoma excision    June 2016  --- s/p  excision right  leg (per pt localized and no recurrence)   Hypertensive heart disease 04/02/2013   Impingement syndrome of right shoulder 01/29/2020   Impotence of organic origin    Left-sided low back pain with left-sided sciatica 01/23/2019   Long term current use of anticoagulant therapy    Mixed hyperlipidemia    slightly   Multiple renal cysts    bilateral   Nephrolithiasis 05/17/2014   Nonischemic cardiomyopathy (HCC)    Osteoarthritis    PAF (paroxysmal atrial fibrillation) ([place])    followed by cardiology  (takes ASA)   RBBB    RBBB (right bundle branch block with left anterior fascicular block) 04/02/2013   Renal oncocytoma of left kidney 06/18/2013   s/p left robotic assited laparoscopic partial nephrectomy 06/18/13   Right ureteral stone    Rotator cuff syndrome of left shoulder 04/17/2016   Status post total left knee replacement 01/01/2020   Tendinopathy of right biceps tendon 01/29/2020   Tendinopathy of right rotator cuff 01/29/2020   Type 2  diabetes mellitus treated with insulin [facility])    endocrinologist-  dr Loanne Drilling   Wears glasses     Family History  Problem Relation Age of Onset   Arthritis Father    Hypertension Father    Diabetes Father    Arthritis Mother    Hypertension Mother    Heart attack Brother 58   Diabetes Brother    Lymphoma Paternal Grandfather    Cancer Maternal Uncle    Cancer Paternal Uncle        type unknown    Past Surgical History:  Procedure Laterality Date   A-FLUTTER ABLATION N/A 10/27/2021   Procedure: A-FLUTTER ABLATION;  Surgeon: Vickie Epley, MD;  Location: [place] [facility];  Service: Cardiovascular;  Laterality: N/A;   CARDIAC CATHETERIZATION  08-17-2011  DR Frederico Hamman TIL[place]   POSSIBLE MODERATE LAD STENOSIS JUST AFTER THE BIFURCATION OF LARGE DIAGONAL BRANCH/ LVEF  40-45%/ MILD GLOBAL HYPODINESIS (CATH DONE FOR ABNORMAL STRESS TEST OF FIXED LATERAL WALL DEFECT & BIFASCICULAR BLAOCK)   CARDIOVERSION N/A 04/02/2013   Procedure: CARDIOVERSION;  Surgeon: Jacolyn Reedy, MD;  Location: [place];  Service: Cardiovascular;  Laterality: N/A;   CARDIOVERSION N/A 12/27/2020   Procedure: CARDIOVERSION;  Surgeon: Skeet Latch, MD;  Location: [place];  Service: Cardiovascular;  Laterality: N/A;   CYSTOSCOPY WITH LITHOLAPAXY N/A 05/18/2015   Procedure: CYSTOSCOPY WITH LITHOLAPAXY;  Surgeon: Rana Snare, MD;  Location: [facility];  Service: Urology;  Laterality: N/A;   CYSTOSCOPY WITH LITHOLAPAXY N/A 01/04/2017   Procedure: CYSTOSCOPY WITH LITHOLAPAXY, Holmium Laser;  Surgeon: Ardis Hughs, MD;  Location: [facility];  Service: Urology;  Laterality: N/A;   CYSTOSCOPY WITH RETROGRADE PYELOGRAM, URETEROSCOPY AND STENT PLACEMENT  11/03/2012   Procedure: CYSTOSCOPY WITH RETROGRADE PYELOGRAM, URETEROSCOPY AND STENT PLACEMENT;  Surgeon: Bernestine Amass, MD;  Location[facility];  Service: Urology;  Laterality: Left;  Cystoscopy/Left Retrograde/Ureteroscopy/Holmium  Laser Litho/Double J Stent   CYSTOSCOPY WITH URETEROSCOPY, STONE BASKETRY AND STENT PLACEMENT Right 05/15/2019   Procedure: CYSTOSCOPY WITH URETEROSCOPY, LASER LITHOTRIPSY STONE BASKETRY AND STENT PLACEMENT, RIGHT RETROGRADE;  Surgeon: Ardis Hughs, MD;  Location: [facility];  Service: Urology;  Laterality: Right;   HOLMIUM LASER APPLICATION  [id number]   Procedure: HOLMIUM LASER APPLICATION;  Surgeon: Bernestine Amass, MD;  Location: [facility];  Service: Urology;  Laterality: Left;   HOLMIUM LASER APPLICATION N/A [id number]   Procedure: HOLMIUM LASER APPLICATION;  Surgeon: Rana Snare, MD;  Location: [facility];  Service: Urology;  Laterality: N/A;   KNEE SURGERY Bilateral [place] Bilateral June 2016   20th-- left leg //  1st-- right leg w/ skin graft from right thigh (no lymph node bx)   NEPHROLITHOTOMY Right 05/17/2014   Procedure: NEPHROLITHOTOMY PERCUTANEOUS;  Surgeon: Bernestine Amass, MD;  Location: [facility];  Service: Urology;  Laterality: Right;   PILONIDAL CYST EXCISION     ROBOTIC ASSITED PARTIAL NEPHRECTOMY Left 06/18/2013   Procedure: ROBOTIC ASSISTED PARTIAL NEPHRECTOMY;  Surgeon: Dutch Gray, MD;  Location: [facility];  Service: Urology;  Laterality: Left;   ROTATOR CUFF REPAIR Left 07/2016   SHOULDER ARTHROSCOPY WITH ROTATOR CUFF REPAIR AND SUBACROMIAL DECOMPRESSION Right 05/27/2020   Procedure: RIGHT SHOULDER ARTHROSCOPY WITH EXTENSIVE DEBRIDEMENT, DISTAL CLAVICLE EXCISION AND SUBACROMIAL DECOMPRESSION;  Surgeon: Leandrew Koyanagi, MD;  Location: [place];  Service: Orthopedics;  Laterality: Right;   TEE WITH CARDIOVERSION  12/27/2020   TEE WITHOUT CARDIOVERSION N/A 12/27/2020   Procedure: TRANSESOPHAGEAL ECHOCARDIOGRAM (TEE);  Surgeon: Skeet Latch, MD;  Location: [facility];  Service: Cardiovascular;  Laterality: N/A;   TONSILLECTOMY  80  (age 24)   TOTAL KNEE ARTHROPLASTY Left 02/07/2017    Procedure: LEFT TOTAL KNEE ARTHROPLASTY;  Surgeon: Leandrew Koyanagi, MD;  Location: [place];  Service: Orthopedics;  Laterality: Left;   TOTAL KNEE ARTHROPLASTY Right 04/25/2017   Procedure: RIGHT TOTAL KNEE ARTHROPLASTY;  Surgeon: Leandrew Koyanagi, MD;  Location: [place];  Service: Orthopedics;  Laterality: Right;  TOTAL KNEE REVISION Left 07/13/2022   Procedure: LEFT TOTAL KNEE REVISION, TIBIAL COMPONENT;  Surgeon: Leandrew Koyanagi, MD;  Location: [place];  Service: Orthopedics;  Laterality: Left;   TRANSURETHRAL RESECTION OF PROSTATE N/A 01/04/2017   Procedure: TRANSURETHRAL RESECTION OF THE PROSTATE (TURP);  Surgeon: Ardis Hughs, MD;  Location[facility];  Service: Urology;  Laterality: N/A;   Social History   Occupational History   Occupation: Government social research officer, Psychologist, occupational  Tobacco Use   Smoking status: Former    Packs/day: 1.00    Years: 6.00    Total pack years: 6.00    Types: Cigarettes    Quit date: 10/31/1969    Years since quitting: 53.2   Smokeless tobacco: Never  Vaping Use   Vaping Use: Never used  Substance and Sexual Activity   Alcohol use: Yes    Alcohol/week: 0.0 standard drinks of alcohol    Comment: OCCASIONAL   Drug use: No   Sexual activity: Never

## 2023-01-19 ENCOUNTER — Ambulatory Visit
Admission: RE | Admit: 2023-01-19 | Discharge: 2023-01-19 | Disposition: A | Discharge status: Home / Self Care | Payer: BC Managed Care – PPO | Source: Ambulatory Visit | Attending: Urology | Admitting: Urology

## 2023-01-19 DIAGNOSIS — R972 Elevated prostate specific antigen [PSA]: Secondary | ICD-10-CM

## 2023-01-19 MED ORDER — GADOPICLENOL 0.5 MMOL/ML IV SOLN
10.0000 mL | Freq: Once | INTRAVENOUS | Status: AC | PRN
  Administered 2023-01-19: 10 mL via INTRAVENOUS

## 2023-01-21 ENCOUNTER — Telehealth: Payer: Self-pay | Admitting: Cardiology

## 2023-01-21 NOTE — Telephone Encounter (Signed)
   Pre-operative Risk Assessment    Patient Name: Matthew Schmitt  DOB: 21-Mar-1951 MRN: [id number]      Request for Surgical Clearance    Procedure:   Prostate ultrasound biopsy   Date of Surgery:  Clearance 01/31/23                                 Surgeon:  Dr. Dayna Ramus  Surgeon's Group or Practice Name:  [facility]  Phone number:  [phone]   Fax number:  [phone]    Type of Clearance Requested:   - Medical  - Pharmacy:  Hold Rivaroxaban (Xarelto) 3 days prior    Type of Anesthesia:  Lidocaine    Additional requests/questions:    Dorthey Sawyer   01/21/2023, 8:46 AM

## 2023-01-21 NOTE — Telephone Encounter (Signed)
Patient with diagnosis of afib/flutter on Xarelto for anticoagulation.    Procedure: prostate ultrasound biopsy Date of procedure: 01/31/23  CHA2DS2-VASc Score = 5  This indicates a 7.2% annual risk of stroke. The patient's score is based upon: CHF History: 1 HTN History: 1 Diabetes History: 1 Stroke History: 0 Vascular Disease History: 1 Age Score: 1 Gender Score: 0  CrCl 67m/min using adjusted body weight Platelet count 141K  Per office protocol, patient can hold Xarelto for 3 days prior to procedure as requested.    **This guidance is not considered finalized until pre-operative APP has relayed final recommendations.**

## 2023-01-21 NOTE — Telephone Encounter (Signed)
   Name: Matthew Schmitt  DOB: 1951-10-24  MRN: [id number]  Primary Cardiologist: Candee Furbish, MD   Preoperative team, please contact this patient and set up a phone call appointment for further preoperative risk assessment. Please obtain consent and complete medication review. Thank you for your help.  I confirm that guidance regarding antiplatelet and oral anticoagulation therapy has been completed and, if necessary, noted below.  Patient with diagnosis of afib/flutter on Xarelto for anticoagulation.     Procedure: prostate ultrasound biopsy Date of procedure: 01/31/23   CHA2DS2-VASc Score = 5  This indicates a 7.2% annual risk of stroke. The patient's score is based upon: CHF History: 1 HTN History: 1 Diabetes History: 1 Stroke History: 0 Vascular Disease History: 1 Age Score: 1 Gender Score: 0   CrCl 67m/min using adjusted body weight Platelet count 141K   Per office protocol, patient can hold Xarelto for 3 days prior to procedure as requested.   JDeberah Pelton NP 01/21/2023, 2:08 PM [place]

## 2023-01-21 NOTE — Telephone Encounter (Signed)
Patient already has an appointment scheduled on 3/5 with Dr. Quentin Ore and can clearance can be addressed then.

## 2023-01-21 NOTE — Progress Notes (Deleted)
  Electrophysiology Office Follow up Visit Note:    Date:  01/21/2023   ID:  Matthew Schmitt, DOB 1951/02/25, MRN [id number]  PCP:  Leonides Sake, MD  [place] Cardiologist:  Candee Furbish, MD  [facility] Electrophysiologist:  Vickie Epley, MD    Interval History:    Matthew Schmitt is a 72 y.o. male who presents for a follow up visit.   Last seen January 31, 2022 for his atrial flutter.  At the last appointment he was doing well without recurrence after his previous a flutter ablation.  He is on Xarelto for stroke prophylaxis.  He has a history of chronic systolic heart failure but his ejection fraction recovered after a successful flutter ablation.       Past medical, surgical, social and family history were reviewed.  ROS:   Please see the history of present illness.    All other systems reviewed and are negative.  EKGs/Labs/Other Studies Reviewed:    The following studies were reviewed today:    EKG:  The ekg ordered today demonstrates ***   Physical Exam:    VS:  There were no vitals taken for this visit.    Wt Readings from Last 3 Encounters:  01/04/23 222 lb (100.7 kg)  11/15/22 222 lb (100.7 kg)  10/24/22 221 lb (100.2 kg)     GEN: *** Well nourished, well developed in no acute distress CARDIAC: ***RRR, no murmurs, rubs, gallops RESPIRATORY:  Clear to auscultation without rales, wheezing or rhonchi       ASSESSMENT:    1. Atrial flutter, unspecified type ([place])   2. Typical atrial flutter ([place])   3. Chronic systolic heart failure (HCC)    PLAN:    In order of problems listed above:  #Atrial flutter Post ablation in December 2022.  Associated with tachycardia mediated cardiomyopathy.  On Xarelto for stroke prophylaxis.   #Chronic systolic heart failure NYHA class II.  Warm and dry on exam.  Thought to be related to tachycardia/atrial flutter.  Continue spironolactone, Entresto, metoprolol and Farxiga.   Follow-up 1 year with  APP.          Total time spent with patient today *** minutes. This includes reviewing records, evaluating the patient and coordinating care.    Signed, Lars Mage, MD, [facility], [facility] 01/21/2023 9:04 PM    Electrophysiology [facility]

## 2023-01-22 ENCOUNTER — Encounter: Payer: Self-pay | Admitting: Cardiology

## 2023-01-22 ENCOUNTER — Ambulatory Visit (INDEPENDENT_AMBULATORY_CARE_PROVIDER_SITE_OTHER): Payer: BC Managed Care – PPO | Admitting: Cardiology

## 2023-01-22 ENCOUNTER — Ambulatory Visit: Payer: BC Managed Care – PPO | Attending: Cardiology | Admitting: Cardiology

## 2023-01-22 VITALS — BP 140/68 | HR 74 | Ht 71.0 in | Wt 229.0 lb

## 2023-01-22 DIAGNOSIS — I483 Typical atrial flutter: Secondary | ICD-10-CM

## 2023-01-22 DIAGNOSIS — I5022 Chronic systolic (congestive) heart failure: Secondary | ICD-10-CM

## 2023-01-22 DIAGNOSIS — I77810 Thoracic aortic ectasia: Secondary | ICD-10-CM | POA: Diagnosis not present

## 2023-01-22 DIAGNOSIS — I4892 Unspecified atrial flutter: Secondary | ICD-10-CM | POA: Diagnosis not present

## 2023-01-22 DIAGNOSIS — Q231 Congenital insufficiency of aortic valve: Secondary | ICD-10-CM | POA: Diagnosis not present

## 2023-01-22 NOTE — Progress Notes (Signed)
Cardiology Office Note:    Date:  01/22/2023   ID:  KANON RAUSCH, DOB 1951-05-04, MRN [id number]  PCP:  Leonides Sake, MD   [facility] Providers Cardiologist:  Candee Furbish, MD Electrophysiologist:  Vickie Epley, MD     Referring MD: Leonides Sake, MD    History of Present Illness:    Matthew Schmitt is a 72 y.o. male here for the follow-up of CAD, atrial fibrillation, and systolic heart failure with prior EF 20-25%. On repeat Echo 12/2021 after conversion of atrial flutter his EF improved to 50-55%.  He saw Dr. Quentin Ore as well showing no recurrence after atrial flutter ablation.  Urology is following him, may have a prostate cancer biopsy pending.  He underwent A flutter ablation on 10/27/2021.   Previously he was seen in the ED/hospitalization on 12/26/20 for atrial flutter.  Had previously had DC cardioversion.  He was found to be in 2-1 atrial flutter with heart rates in the 160s tried beta-blocker.  In the ER his heart rates were in the 130s.  He was given IV amiodarone his echo showed a reduction in ejection fraction of 20 to 25%.  We went ahead and did a TEE cardioversion and he is maintaining sinus rhythm.  P.o. amiodarone.  Eliquis.  With his conduction disease, right bundle branch block left anterior fascicular block first-degree AV block we will watch for signs of worsening conduction.  With his EF reduction to we diuresed.  He was switched to [facility] in the hospital stay as well as Toprol.  Farxiga 10 mg also.  Spironolactone 12.5.  Baseline creatinine has been approximately 1.4-1.5.  Overall really has no specific complaints.  Always asking if he can come off some medication but we expressed the importance of the current medication strategy he is on to help maintain good cardiovascular function.  He is now off of Xarelto.  Doing well.  No significant shortness of breath.  No chest pain no bleeding.  Bruising has improved.  Past Medical History:  Diagnosis Date    Anxiety    Arthritis    everywhere   Arthrosis of right acromioclavicular joint 01/29/2020   Atrial flutter [facility])    Onset May 2014 Cardioversion done    BPH associated with nocturia    BPH with obstruction/lower urinary tract symptoms 01/04/2017   CAD (coronary artery disease) 04/02/2013   Cath 2012 moderate mid LAD lesion, otherwise not much disease    Diabetes ([place]) 01/18/2018   Dilated aortic root (HCC)    aortic root 44 mm, ascending aorta 46 mm 12/22/21 echo   Encounter for long-term (current) use of other medications 07/27/2013   Essential hypertension, benign    First degree heart block    Heart murmur    mild-moderate AS and AR 01/11/22   History of bilateral knee replacement 02/07/2017   History of bladder stone    History of kidney stones    History of melanoma excision    June 2016  --- s/p  excision right  leg (per pt localized and no recurrence)   Hypertensive heart disease 04/02/2013   Impingement syndrome of right shoulder 01/29/2020   Impotence of organic origin    Left-sided low back pain with left-sided sciatica 01/23/2019   Long term current use of anticoagulant therapy    Mixed hyperlipidemia    slightly   Multiple renal cysts    bilateral   Nephrolithiasis 05/17/2014   Nonischemic cardiomyopathy ([place])    Osteoarthritis  PAF (paroxysmal atrial fibrillation) ([place])    followed by cardiology  (takes ASA)   RBBB    RBBB (right bundle branch block with left anterior fascicular block) 04/02/2013   Renal oncocytoma of left kidney 06/18/2013   s/p left robotic assited laparoscopic partial nephrectomy 06/18/13   Right ureteral stone    Rotator cuff syndrome of left shoulder 04/17/2016   Status post total left knee replacement 01/01/2020   Tendinopathy of right biceps tendon 01/29/2020   Tendinopathy of right rotator cuff 01/29/2020   Type 2 diabetes mellitus treated with insulin [facility])    endocrinologist-  dr Loanne Drilling   Wears glasses     Past Surgical  History:  Procedure Laterality Date   A-FLUTTER ABLATION N/A 10/27/2021   Procedure: A-FLUTTER ABLATION;  Surgeon: Vickie Epley, MD;  Location: [place] [facility];  Service: Cardiovascular;  Laterality: N/A;   CARDIAC CATHETERIZATION  08-17-2011  DR Frederico Hamman TILLEY   POSSIBLE MODERATE LAD STENOSIS JUST AFTER THE BIFURCATION OF LARGE DIAGONAL BRANCH/ LVEF  40-45%/ MILD GLOBAL HYPODINESIS (CATH DONE FOR ABNORMAL STRESS TEST OF FIXED LATERAL WALL DEFECT & BIFASCICULAR BLAOCK)   CARDIOVERSION N/A 04/02/2013   Procedure: CARDIOVERSION;  Surgeon: Jacolyn Reedy, MD;  Location: [place];  Service: Cardiovascular;  Laterality: N/A;   CARDIOVERSION N/A 12/27/2020   Procedure: CARDIOVERSION;  Surgeon: Skeet Latch, MD;  Location: [place];  Service: Cardiovascular;  Laterality: N/A;   CYSTOSCOPY WITH LITHOLAPAXY N/A 05/18/2015   Procedure: CYSTOSCOPY WITH LITHOLAPAXY;  Surgeon: Rana Snare, MD;  Location: [facility];  Service: Urology;  Laterality: N/A;   CYSTOSCOPY WITH LITHOLAPAXY N/A 01/04/2017   Procedure: CYSTOSCOPY WITH LITHOLAPAXY, Holmium Laser;  Surgeon: Ardis Hughs, MD;  Location: [facility];  Service: Urology;  Laterality: N/A;   CYSTOSCOPY WITH RETROGRADE PYELOGRAM, URETEROSCOPY AND STENT PLACEMENT  11/03/2012   Procedure: CYSTOSCOPY WITH RETROGRADE PYELOGRAM, URETEROSCOPY AND STENT PLACEMENT;  Surgeon: Bernestine Amass, MD;  Location: [facility];  Service: Urology;  Laterality: Left;  Cystoscopy/Left Retrograde/Ureteroscopy/Holmium Laser Litho/Double J Stent   CYSTOSCOPY WITH URETEROSCOPY, STONE BASKETRY AND STENT PLACEMENT Right 05/15/2019   Procedure: CYSTOSCOPY WITH URETEROSCOPY, LASER LITHOTRIPSY STONE BASKETRY AND STENT PLACEMENT, RIGHT RETROGRADE;  Surgeon: Ardis Hughs, MD;  Location: [facility];  Service: Urology;  Laterality: Right;   HOLMIUM LASER APPLICATION  [id number]   Procedure: HOLMIUM LASER APPLICATION;  Surgeon:  Bernestine Amass, MD;  Location: [facility];  Service: Urology;  Laterality: Left;   HOLMIUM LASER APPLICATION N/A [id number]   Procedure: HOLMIUM LASER APPLICATION;  Surgeon: Rana Snare, MD;  Location: [facility];  Service: Urology;  Laterality: N/A;   KNEE SURGERY Bilateral [place] Bilateral June 2016   20th-- left leg //  1st-- right leg w/ skin graft from right thigh (no lymph node bx)   NEPHROLITHOTOMY Right 05/17/2014   Procedure: NEPHROLITHOTOMY PERCUTANEOUS;  Surgeon: Bernestine Amass, MD;  [facility];  Service: Urology;  Laterality: Right;   PILONIDAL CYST EXCISION     ROBOTIC ASSITED PARTIAL NEPHRECTOMY Left 06/18/2013   Procedure: ROBOTIC ASSISTED PARTIAL NEPHRECTOMY;  Surgeon: Dutch Gray, MD;  Location[facility];  Service: Urology;  Laterality: Left;   ROTATOR CUFF REPAIR Left 07/2016   SHOULDER ARTHROSCOPY WITH ROTATOR CUFF REPAIR AND SUBACROMIAL DECOMPRESSION Right 05/27/2020   Procedure: RIGHT SHOULDER ARTHROSCOPY WITH EXTENSIVE DEBRIDEMENT, DISTAL CLAVICLE EXCISION AND SUBACROMIAL DECOMPRESSION;  Surgeon: Leandrew Koyanagi,  MD;  Location: [place];  Service: Orthopedics;  Laterality: Right;   TEE WITH CARDIOVERSION  12/27/2020   TEE WITHOUT CARDIOVERSION N/A 12/27/2020   Procedure: TRANSESOPHAGEAL ECHOCARDIOGRAM (TEE);  Surgeon: Skeet Latch, MD;  Location: [facility];  Service: Cardiovascular;  Laterality: N/A;   TONSILLECTOMY  78  (age 52)   TOTAL KNEE ARTHROPLASTY Left 02/07/2017   Procedure: LEFT TOTAL KNEE ARTHROPLASTY;  Surgeon: Leandrew Koyanagi, MD;  Location: [place];  Service: Orthopedics;  Laterality: Left;   TOTAL KNEE ARTHROPLASTY Right 04/25/2017   Procedure: RIGHT TOTAL KNEE ARTHROPLASTY;  Surgeon: Leandrew Koyanagi, MD;  Location: [place];  Service: Orthopedics;  Laterality: Right;   TOTAL KNEE REVISION Left 07/13/2022   Procedure: LEFT TOTAL KNEE REVISION, TIBIAL COMPONENT;  Surgeon: Leandrew Koyanagi, MD;   Location: [place];  Service: Orthopedics;  Laterality: Left;   TRANSURETHRAL RESECTION OF PROSTATE N/A 01/04/2017   Procedure: TRANSURETHRAL RESECTION OF THE PROSTATE (TURP);  Surgeon: Ardis Hughs, MD;  [facility];  Service: Urology;  Laterality: N/A;    Current Medications: Current Meds  Medication Sig   dapagliflozin propanediol (FARXIGA) 10 MG TABS tablet Take 1 tablet (10 mg total) by mouth daily before breakfast.   insulin degludec (TRESIBA FLEXTOUCH) 200 UNIT/ML FlexTouch Pen Inject 90 Units into the skin daily.   Insulin Pen Needle (PEN NEEDLES) 30G X 8 MM MISC 1 each by Does not apply route daily. E11.9   Lancets (ONETOUCH DELICA PLUS 123XX123) MISC 1 each by Other route 2 (two) times daily. E11.9   methocarbamol (ROBAXIN) 750 MG tablet Take 1 tablet (750 mg total) by mouth 2 (two) times daily as needed for muscle spasms.   metoprolol succinate (TOPROL-XL) 50 MG 24 hr tablet TAKE 1 TABLET DAILY, TAKE WITH OR IMMEDIATELY FOLLOWING A MEAL   nitroGLYCERIN (NITROSTAT) 0.4 MG SL tablet Place 0.4 mg under the tongue every 5 (five) minutes x 3 doses as needed for chest pain.   ONETOUCH ULTRA test strip USE 1 STRIP TWICE A DAY   rosuvastatin (CRESTOR) 20 MG tablet TAKE 1 TABLET DAILY   sacubitril-valsartan (ENTRESTO) 97-103 MG Take 1 tablet by mouth 2 (two) times daily.   Semaglutide,0.25 or 0.'5MG'$ /DOS, 2 MG/3ML SOPN Inject 0.5 mg into the skin once a week.   spironolactone (ALDACTONE) 25 MG tablet TAKE ONE-HALF (1/2) TABLET DAILY   trolamine salicylate (ASPERCREME) 10 % cream Apply 1 application  topically 3 (three) times daily as needed for muscle pain.     Allergies:   Actos [pioglitazone]   Social History   Socioeconomic History   Marital status: Married    Spouse name: Not on file   Number of children: 2   Years of education: 16   Highest education level: Not on file  Occupational History   Occupation: Government social research officer, Psychologist, occupational  Tobacco Use   Smoking  status: Former    Packs/day: 1.00    Years: 6.00    Total pack years: 6.00    Types: Cigarettes    Quit date: 10/31/1969    Years since quitting: 53.2   Smokeless tobacco: Never  Vaping Use   Vaping Use: Never used  Substance and Sexual Activity   Alcohol use: Yes    Alcohol/week: 0.0 standard drinks of alcohol    Comment: OCCASIONAL   Drug use: No   Sexual activity: Never  Other Topics Concern   Not on file  Social History Narrative   Regular exercise-no   Social  Determinants of Health   Financial Resource Strain: Not on file  Food Insecurity: Not on file  Transportation Needs: Not on file  Physical Activity: Not on file  Stress: Not on file  Social Connections: Not on file     Family History: The patient's family history includes Arthritis in his father and mother; Cancer in his maternal uncle and paternal uncle; Diabetes in his brother and father; Heart attack (age of onset: 71) in his brother; Hypertension in his father and mother; Lymphoma in his paternal grandfather.  ROS:   Please see the history of present illness.   All other systems reviewed and are negative.  EKGs/Labs/Other Studies Reviewed:    The following studies were reviewed today:  Echo 12/22/2021 IMPRESSIONS    1. Compared with the echo [id number], systolic function has improved.   2. Left ventricular ejection fraction, by estimation, is 50 to 55%. The  left ventricle has low normal function. The left ventricle demonstrates  regional wall motion abnormalities (see scoring diagram/findings for  description). The left ventricular  internal cavity size was moderately dilated. There is moderate concentric  left ventricular hypertrophy. Left ventricular diastolic parameters are  consistent with Grade I diastolic dysfunction (impaired relaxation). The  average left ventricular global  longitudinal strain is 21.9 %. The global longitudinal strain is normal.   3. Right ventricular systolic function is  normal. The right ventricular  size is normal. There is normal pulmonary artery systolic pressure.   4. Left atrial size was severely dilated.   5. The mitral valve is normal in structure. Trivial mitral valve  regurgitation. No evidence of mitral stenosis.   6. The aortic valve is normal in structure. There is moderate  calcification of the aortic valve. There is moderate thickening of the  aortic valve. Aortic valve regurgitation is mild to moderate. Mild to  moderate aortic valve stenosis. Aortic  regurgitation PHT measures 600 msec. Aortic valve mean gradient measures  19.0 mmHg. Aortic valve Vmax measures 3.04 m/s.   7. There is moderate dilatation of the ascending aorta, measuring 46 mm.  There is moderate dilatation of the aortic root, measuring 44 mm.   A-Flutter Ablation 10/27/2021: CONCLUSIONS:   1. Isthmus-dependent counter clockwise right atrial flutter.   2. Successful radiofrequency ablation of atrial flutter along the cavotricuspid isthmus with complete bidirectional isthmus block achieved.   3. No inducible arrhythmias following ablation.   4.  Electrophysiology study with infusion of Isopril revealed a prolonged HV interval of 75 ms, wide QRS 175 ms.  5. No early apparent complications.    Cardiac CT/CTA 10/20/2021: FINDINGS: A 120 kV prospective scan was triggered in the descending thoracic aorta at 111 HU's. Gantry rotation speed was 280 msecs and collimation was .9 mm. No beta blockade and no NTG was given. The 3D data set was reconstructed in 5% intervals of the 60-80 % of the R-R cycle. Diastolic phases were analyzed on a dedicated work station using MPR, MIP and VRT modes. The patient received 80 cc of contrast.   There is normal pulmonary vein drainage into the left atrium (2 on the right and 2 on the left) with ostial measurements as follows:   RUPV: 21.2 x 16.5 mm   RLPV: 19.9 x 18.2 mm   LUPV: 23.4 x 15.6 mm   LLPV: 20.4 x 20.1 mm   The left  atrial appendage is large chicken wing type with two lobes and ostial size 31 x 18 mm and length 43 mm. There  is no thrombus in the left atrial appendage.   The esophagus runs in the left atrial midline adjacent to the left lower pulmonary vein.   Aorta: Ascending aorta mildly dilated. 4.1 cm. No dissection. Aortic atherosclerosis.   Aortic Valve:  Trileaflet.  Moderate calcification.   Coronary Arteries: Normal coronary origin. Right dominance. The study was performed without use of NTG and insufficient for plaque evaluation. Coronary calcium score 1092. This was 85th percentile for age and gender.   IMPRESSION: 1. There is normal pulmonary vein drainage into the left atrium.   2. The left atrial appendage is large chicken wing type with two lobes and ostial size 31 x 18 mm and length 43 mm. There is no thrombus in the left atrial appendage.   3. The esophagus runs in the left atrial midline adjacent to the left lower pulmonary vein.   4. Coronary calcium score 1092. This was 85th percentile for age and gender.   5. Ascending aorta mildly dilated.  4.1 cm.  Monitor 09/2021: HR 49 - 131bpm, average 85 bpm. 100% atrial flutter. No atrial fibrillation detected. Rare ventricular ectopy  TEE (12/27/20): 1. Left ventricular ejection fraction, by estimation, is <20%. The left ventricle has severely decreased function. The left ventricle demonstrates global hypokinesis. The left ventricular internal cavity size was mildly to moderately dilated.  2. Right ventricular systolic function is moderately reduced. The right ventricular size is normal. 3. No left atrial/left atrial appendage thrombus was detected. The LAA emptying velocity was 38 cm/s. 4. Multiple mild regurgitant jets that collectively cause moderate mitral regurgitation. The mitral valve is normal in structure. Moderate mitral valve regurgitation. No evidence of mitral stenosis. 5. There is fusion and calcification of the  left and right coronary cusps. The aortic valve is bicuspid. Aortic valve regurgitation is not visualized. No aortic stenosis is present. 6. Aortic dilatation noted. There is mild dilatation of the ascending aorta, measuring 41 mm. Conclusion(s)/Recommendation(s): Normal biventricular function without evidence of hemodynamically significant valvular heart disease.  EKG:  EKG is personally reviewed and interpreted. 02/20/2022: EKG was not ordered. 01/31/2022:  (Dr. Quentin Ore) Sinus rhythm at 74 bpm 08/18/2021: Typical atrial flutter heart rate 71 bpm with variable conduction) branch block left anterior fascicular block 03/24/21-EKG was not ordered.  Recent Labs: 07/10/2022: BUN 18; Creatinine, Ser 0.97; Potassium 4.1; Sodium 142 07/14/2022: Hemoglobin 14.0; Platelets 141 09/07/2022: ALT 16   Recent Lipid Panel    Component Value Date/Time   CHOL 114 09/07/2022 0827   TRIG 112 09/07/2022 0827   HDL 36 (L) 09/07/2022 0827   CHOLHDL 3.2 09/07/2022 0827   CHOLHDL 4.2 07/20/2014 0822   VLDL 26 07/20/2014 0822   LDLCALC 57 09/07/2022 0827     Risk Assessment/Calculations:      Physical Exam:    VS:  BP (!) 140/68   Pulse 74   Ht '5\' 11"'$  (1.803 m)   Wt 229 lb (103.9 kg)   SpO2 97%   BMI 31.94 kg/m     Wt Readings from Last 3 Encounters:  01/22/23 229 lb (103.9 kg)  01/22/23 229 lb (103.9 kg)  01/04/23 222 lb (100.7 kg)   GEN: Well nourished, well developed in no acute distress HEENT: Normal NECK: No JVD; No carotid bruits LYMPHATICS: No lymphadenopathy CARDIAC: RRR, normal rate, 3/6 systolic aortic murmur, no rubs, no gallops RESPIRATORY:  Clear to auscultation without rales, wheezing or rhonchi  ABDOMEN: Soft, non-tender, non-distended MUSCULOSKELETAL:  No edema; No deformity  SKIN: Warm and dry.  NEUROLOGIC:  Alert and oriented x 3 PSYCHIATRIC:  Normal affect   ASSESSMENT:    1. Bicuspid aortic valve   2. Chronic systolic heart failure ([place])   3. Typical atrial flutter  ([place])   4. Dilated aortic root (HCC)       PLAN:    In order of problems listed above:   Atrial flutter [facility]) Prior ablation Dr. Quentin Ore 2022, doing very well.  No recurrence.  He is now off of Xarelto.  Asymptomatic.  Likely had in part a tachycardia mediated cardiomyopathy as well.  No bleeding.  Aortic stenosis Mild to moderate aortic stenosis with V-max of 3 m/s.  We will continue to monitor.  Mean gradient was 19 mmHg.  EF 50 to 55%.  Repeating echocardiogram   Bicuspid aortic valve Bicuspid valve noted with fusion and calcification of the left and right coronary cusp on TEE 12/27/2020.  Dilated aortic root (HCC) 41 mm on TEE in 2022.  Continue to monitor.  RBBB (right bundle branch block with left anterior fascicular block) No high risk symptoms such as syncope.  Hypercoagulable state due to atrial flutter ([place]) Now off of Xarelto.    Chronic systolic heart failure (HCC) Previously his Entresto fell off of his list.  We restarted moderate dose 49/51 at prior visit.  Now is up to 97-103 mg. Continue with Farxiga 10, spironolactone 12.5, Toprol 50.  He did ask if he could come off of some of his medications however his blood pressure previously was 166/80.  He is also seen excellent improvement in his ejection fraction from 20 to 25% up to 50 to 55%.  NYHA class I-II.  CAD Calcium score 1000 in 2022.  Continue Crestor '20mg'$ . Goal is LDL < 70, LDL 57  Expressed importance of maintaining good goal-directed medical therapy.  Follow up in 6 months with EP, 1 year with me  Medication Adjustments/Labs and Tests Ordered: Current medicines are reviewed at length with the patient today.  Concerns regarding medicines are outlined above.   Orders Placed This Encounter  Procedures   ECHOCARDIOGRAM COMPLETE   No orders of the defined types were placed in this encounter.  Patient Instructions  Medication Instructions:  The current medical regimen is effective;  continue present  plan and medications.  *If you need a refill on your cardiac medications before your next appointment, please call your pharmacy*  Testing/Procedures: Your physician has requested that you have an echocardiogram. Echocardiography is a painless test that uses sound waves to create images of your heart. It provides your doctor with information about the size and shape of your heart and how well your heart's chambers and valves are working. This procedure takes approximately one hour. There are no restrictions for this procedure. Please do NOT wear cologne, perfume, aftershave, or lotions (deodorant is allowed). Please arrive 15 minutes prior to your appointment time.   Follow-Up: At [facility], you and your health needs are our priority.  As part of our continuing mission to provide you with exceptional heart care, we have created designated Provider Care Teams.  These Care Teams include your primary Cardiologist (physician) and Advanced Practice Providers (APPs -  Physician Assistants and Nurse Practitioners) who all work together to provide you with the care you need, when you need it.  We recommend signing up for the patient portal called "MyChart".  Sign up information is provided on this After Visit Summary.  MyChart is used to connect with patients for Virtual Visits (Telemedicine).  Patients are able to view lab/test results, encounter notes, upcoming appointments, etc.  Non-urgent messages can be sent to your provider as well.   To learn more about what you can do with MyChart, go to [email or web address].    Your next appointment:   1 year(s)  Provider:   Candee Furbish, MD        [facility],acting as a scribe for Candee Furbish, MD.,have documented all relevant documentation on the behalf of Candee Furbish, MD,as directed by  Candee Furbish, MD while in the presence of Candee Furbish, MD.  I, Candee Furbish, MD, have reviewed all documentation for this visit. The documentation on  01/22/23 for the exam, diagnosis, procedures, and orders are all accurate and complete.  Signed, Candee Furbish, MD  01/22/2023 9:26 AM    [facility]

## 2023-01-22 NOTE — Patient Instructions (Signed)
Medication Instructions:  The current medical regimen is effective;  continue present plan and medications.  *If you need a refill on your cardiac medications before your next appointment, please call your pharmacy*  Testing/Procedures: Your physician has requested that you have an echocardiogram. Echocardiography is a painless test that uses sound waves to create images of your heart. It provides your doctor with information about the size and shape of your heart and how well your heart's chambers and valves are working. This procedure takes approximately one hour. There are no restrictions for this procedure. Please do NOT wear cologne, perfume, aftershave, or lotions (deodorant is allowed). Please arrive 15 minutes prior to your appointment time.   Follow-Up: At [facility], you and your health needs are our priority.  As part of our continuing mission to provide you with exceptional heart care, we have created designated Provider Care Teams.  These Care Teams include your primary Cardiologist (physician) and Advanced Practice Providers (APPs -  Physician Assistants and Nurse Practitioners) who all work together to provide you with the care you need, when you need it.  We recommend signing up for the patient portal called "MyChart".  Sign up information is provided on this After Visit Summary.  MyChart is used to connect with patients for Virtual Visits (Telemedicine).  Patients are able to view lab/test results, encounter notes, upcoming appointments, etc.  Non-urgent messages can be sent to your provider as well.   To learn more about what you can do with MyChart, go to [email or web address].    Your next appointment:   1 year(s)  Provider:   Candee Furbish, MD

## 2023-01-22 NOTE — Progress Notes (Signed)
Electrophysiology Office Follow up Visit Note:    Date:  01/22/2023   ID:  Matthew Schmitt, DOB 1951-11-07, MRN [id number]  PCP:  Leonides Sake, MD  [place] Cardiologist:  Candee Furbish, MD  [facility] Electrophysiologist:  Vickie Epley, MD    Interval History:    Matthew Schmitt is a 72 y.o. male who presents for a follow up visit.   Last seen January 31, 2022 for his atrial flutter.  At the last appointment he was doing well without recurrence after his previous a flutter ablation.  He is on Xarelto for stroke prophylaxis.  He has a history of chronic systolic heart failure but his ejection fraction recovered after a successful flutter ablation.  Today, he is not feeling well. He may have prostate cancer pending biopsy, being followed by [facility].  From a cardiovascular perspective he is doing well. He denies any recurrent arrhythmias. We revisited discussion of stopping some medications. We also discussed potential methods for monitoring his heart rhythm including ILR and KardiaMobile.  He denies any palpitations, chest pain, shortness of breath, or peripheral edema. No lightheadedness, headaches, syncope, orthopnea, or PND.     Past medical, surgical, social and family history were reviewed.  ROS:   Please see the history of present illness.    All other systems reviewed and are negative.  EKGs/Labs/Other Studies Reviewed:    The following studies were reviewed today:   EKG:  The ekg ordered today demonstrates sinus rhythm with a right bundle branch block and left axis deviation   Physical Exam:    VS:  BP (!) 140/68   Pulse 74   Ht '5\' 11"'$  (1.803 m)   Wt 229 lb (103.9 kg)   SpO2 97%   BMI 31.94 kg/m     Wt Readings from Last 3 Encounters:  01/22/23 229 lb (103.9 kg)  01/04/23 222 lb (100.7 kg)  11/15/22 222 lb (100.7 kg)     GEN: Well nourished, well developed in no acute distress CARDIAC: RRR, no murmurs, rubs, gallops RESPIRATORY:   Clear to auscultation without rales, wheezing or rhonchi       ASSESSMENT:    1. Atrial flutter, unspecified type ([place])   2. Typical atrial flutter ([place])   3. Chronic systolic heart failure (HCC)    PLAN:    In order of problems listed above:  #Atrial flutter Post ablation in December 2022.  Associated with tachycardia mediated cardiomyopathy.  On Xarelto for stroke prophylaxis. Given he has had no recurrence of arrhythmia, plan to discontinue Xarelto.  We discussed atrial fibrillation surveillance strategies in clinic today including intermittent checks with the [employer] Kardia mobile, using a wearable device such as the Apple or Samsung watch and implantable loop recorder.  He is interested in potentially pursuing the [employer] mobile device.  I encouraged him to check recordings at least 3-4 times per week and additional times if he is feeling symptomatic.  He should immediately contact us should he experience atrial fibrillation recurrence.  He understands that there is a risk of stroke should he develop atrial fibrillation that goes unnoticed.   #Chronic systolic heart failure NYHA class II.  Warm and dry on exam.  Thought to be related to tachycardia/atrial flutter.  Continue spironolactone, Entresto, metoprolol and Farxiga.   Follow-up 6 months with APP.    I,Mathew Stumpf,acting as a Education administrator for Vickie Epley, MD.,have documented all relevant documentation on the behalf of Vickie Epley, MD,as directed  by  Vickie Epley, MD while in the presence of Vickie Epley, MD.  I, Vickie Epley, MD, have reviewed all documentation for this visit. The documentation on 01/22/23 for the exam, diagnosis, procedures, and orders are all accurate and complete.   Signed, Lars Mage, MD, [facility], [facility] 01/22/2023 9:02 AM    Electrophysiology [facility]

## 2023-01-22 NOTE — Patient Instructions (Signed)
Medication Instructions:  Your physician has recommended you make the following change in your medication:  1) STOP taking xarelto  *If you need a refill on your cardiac medications before your next appointment, please call your pharmacy*  Follow-Up: At [facility], you and your health needs are our priority.  As part of our continuing mission to provide you with exceptional heart care, we have created designated Provider Care Teams.  These Care Teams include your primary Cardiologist (physician) and Advanced Practice Providers (APPs -  Physician Assistants and Nurse Practitioners) who all work together to provide you with the care you need, when you need it.  Your next appointment:   6 month(s)  Provider:   You will see one of the following Advanced Practice Providers on your designated Care Team:   Tommye Standard, Renaee Munda" Steele, [place] Mamie Levers, NP Other Instructions  Jodelle Red for monitoring of EKGs at home: You can look into the [facility] device by [employer].This device is purchased by you and it connects to an application you download to your smart phone.  It can detect abnormal heart rhythms and alert you to contact your doctor for further evaluation. The device is approximately $90 and the phone application is free.  The web site is:  https://www.alivecor.com

## 2023-01-29 ENCOUNTER — Ambulatory Visit (HOSPITAL_BASED_OUTPATIENT_CLINIC_OR_DEPARTMENT_OTHER): Payer: BC Managed Care – PPO | Admitting: General Surgery

## 2023-01-29 NOTE — Progress Notes (Unsigned)
Patient ID: Matthew Schmitt, male   DOB: 1951-05-28, 72 y.o.   MRN: [id number]  HPI: Matthew Schmitt is a 72 y.o.-year-old male, returning for follow-up for DM2, dx in 2002, insulin-dependent since 2017, uncontrolled, with complications (CAD, nonischemic cardiomyopathy, atrial flutter/PAF). Pt. previously saw Dr. Loanne Drilling, but last visit with me 2.5 months ago.  Interim history: Since last visit, he had TKR -he developed DVT afterwards >> resolved, but still has pain. No increased urination, blurry vision, nausea, chest pain.  Reviewed HbA1c: Lab Results  Component Value Date   HGBA1C 8.7 (A) 11/15/2022   HGBA1C 7.4 (H) 06/13/2022   HGBA1C 7.9 (H) 05/04/2022   HGBA1C 8.6 (H) 04/06/2022   HGBA1C 8.5 (H) 03/06/2022   HGBA1C 8.0 (A) 01/05/2022   HGBA1C 6.4 (A) 08/04/2021   HGBA1C 6.4 (A) 03/24/2021   HGBA1C 8.3 (A) 12/20/2020   HGBA1C 6.7 (A) 08/19/2020   At last visit, he was on: - Farxiga 10 mg in am - Rybelsus 3 mg before b'fast - added 06/2022 >> stopped b/c no refills - Basaglar 90 units at bedtime He developed cough and edema from Actos. He tried Metformin >> gained weight.  He tried Trulicity before  - no GI sxs.  We changed to: - Tresiba 90 units daily - Ozempic 0.25 >> 0.5 mg weekly - Farxiga 10 mg in am  Pt checks his sugars 1x a day and they are: - am: 92-260 (missed meds) >> 108, 126-290 - 2h after b'fast: n/c - before lunch: n/c - 2h after lunch: n/c - before dinner: n/c - 2h after dinner: n/c - bedtime: n/c - nighttime: n/c Lowest sugar was 22  - years ago; more recently: 92 >> 108. Highest sugar was 312>> 290.  Glucometer: One Touch ultra  - + Mild CKD, last BUN/creatinine:  Lab Results  Component Value Date   BUN 18 07/10/2022   BUN 26 10/05/2021   CREATININE 0.97 07/10/2022   CREATININE 1.15 10/05/2021  04/24/2022: Glu 64, BUN/cr 21/1.11 On Entresto.  -+ HL; last set of lipids: Lab Results  Component Value Date   CHOL 114 09/07/2022   HDL 36  (L) 09/07/2022   LDLCALC 57 09/07/2022   TRIG 112 09/07/2022   CHOLHDL 3.2 09/07/2022  04/28/2022: 127/86/38/72 He is on Crestor 20 mg daily.  - last eye exam was in 2020. No DR reportedly.   - no numbness and tingling in his feet.  Last foot exam 11/15/2022.  He also has a history of nephrolithiasis, right bundle branch block. He sees urology for a high PSA.  ROS: + see HPI  Past Medical History:  Diagnosis Date   Anxiety    Arthritis    everywhere   Arthrosis of right acromioclavicular joint 01/29/2020   Atrial flutter [facility])    Onset May 2014 Cardioversion done    BPH associated with nocturia    BPH with obstruction/lower urinary tract symptoms 01/04/2017   CAD (coronary artery disease) 04/02/2013   Cath 2012 moderate mid LAD lesion, otherwise not much disease    Diabetes ([place]) 01/18/2018   Dilated aortic root (HCC)    aortic root 44 mm, ascending aorta 46 mm 12/22/21 echo   Encounter for long-term (current) use of other medications 07/27/2013   Essential hypertension, benign    First degree heart block    Heart murmur    mild-moderate AS and AR 01/11/22   History of bilateral knee replacement 02/07/2017   History of bladder stone    History  of kidney stones    History of melanoma excision    June 2016  --- s/p  excision right  leg (per pt localized and no recurrence)   Hypertensive heart disease 04/02/2013   Impingement syndrome of right shoulder 01/29/2020   Impotence of organic origin    Left-sided low back pain with left-sided sciatica 01/23/2019   Long term current use of anticoagulant therapy    Mixed hyperlipidemia    slightly   Multiple renal cysts    bilateral   Nephrolithiasis 05/17/2014   Nonischemic cardiomyopathy (HCC)    Osteoarthritis    PAF (paroxysmal atrial fibrillation) ([place])    followed by cardiology  (takes ASA)   RBBB    RBBB (right bundle branch block with left anterior fascicular block) 04/02/2013   Renal oncocytoma of left kidney  06/18/2013   s/p left robotic assited laparoscopic partial nephrectomy 06/18/13   Right ureteral stone    Rotator cuff syndrome of left shoulder 04/17/2016   Status post total left knee replacement 01/01/2020   Tendinopathy of right biceps tendon 01/29/2020   Tendinopathy of right rotator cuff 01/29/2020   Type 2 diabetes mellitus treated with insulin [facility])    endocrinologist-  dr Loanne Drilling   Wears glasses    Past Surgical History:  Procedure Laterality Date   A-FLUTTER ABLATION N/A 10/27/2021   Procedure: A-FLUTTER ABLATION;  Surgeon: Vickie Epley, MD;  Location: [facility];  Service: Cardiovascular;  Laterality: N/A;   CARDIAC CATHETERIZATION  08-17-2011  DR Frederico Hamman TILLEY   POSSIBLE MODERATE LAD STENOSIS JUST AFTER THE BIFURCATION OF LARGE DIAGONAL BRANCH/ LVEF  40-45%/ MILD GLOBAL HYPODINESIS (CATH DONE FOR ABNORMAL STRESS TEST OF FIXED LATERAL WALL DEFECT & BIFASCICULAR BLAOCK)   CARDIOVERSION N/A 04/02/2013   Procedure: CARDIOVERSION;  Surgeon: Jacolyn Reedy, MD;  Location: [place];  Service: Cardiovascular;  Laterality: N/A;   CARDIOVERSION N/A 12/27/2020   Procedure: CARDIOVERSION;  Surgeon: Skeet Latch, MD;  Location: [place];  Service: Cardiovascular;  Laterality: N/A;   CYSTOSCOPY WITH LITHOLAPAXY N/A 05/18/2015   Procedure: CYSTOSCOPY WITH LITHOLAPAXY;  Surgeon: Rana Snare, MD;  [facility];  Service: Urology;  Laterality: N/A;   CYSTOSCOPY WITH LITHOLAPAXY N/A 01/04/2017   Procedure: CYSTOSCOPY WITH LITHOLAPAXY, Holmium Laser;  Surgeon: Ardis Hughs, MD;  Location[facility];  Service: Urology;  Laterality: N/A;   CYSTOSCOPY WITH RETROGRADE PYELOGRAM, URETEROSCOPY AND STENT PLACEMENT  11/03/2012   Procedure: CYSTOSCOPY WITH RETROGRADE PYELOGRAM, URETEROSCOPY AND STENT PLACEMENT;  Surgeon: Bernestine Amass, MD;  Location[facility];  Service: Urology;  Laterality: Left;  Cystoscopy/Left Retrograde/Ureteroscopy/Holmium  Laser Litho/Double J Stent   CYSTOSCOPY WITH URETEROSCOPY, STONE BASKETRY AND STENT PLACEMENT Right 05/15/2019   Procedure: CYSTOSCOPY WITH URETEROSCOPY, LASER LITHOTRIPSY STONE BASKETRY AND STENT PLACEMENT, RIGHT RETROGRADE;  Surgeon: Ardis Hughs, MD;  Location: [facility];  Service: Urology;  Laterality: Right;   HOLMIUM LASER APPLICATION  [id number]   Procedure: HOLMIUM LASER APPLICATION;  Surgeon: Bernestine Amass, MD;  Location: [facility];  Service: Urology;  Laterality: Left;   HOLMIUM LASER APPLICATION N/A [id number]   Procedure: HOLMIUM LASER APPLICATION;  Surgeon: Rana Snare, MD;  Location: [facility];  Service: Urology;  Laterality: N/A;   KNEE SURGERY Bilateral [place] Bilateral June 2016   20th-- left leg //  1st-- right leg w/ skin graft from right thigh (no lymph node bx)  NEPHROLITHOTOMY Right 05/17/2014   Procedure: NEPHROLITHOTOMY PERCUTANEOUS;  Surgeon: Bernestine Amass, MD;  [facility];  Service: Urology;  Laterality: Right;   PILONIDAL CYST EXCISION     ROBOTIC ASSITED PARTIAL NEPHRECTOMY Left 06/18/2013   Procedure: ROBOTIC ASSISTED PARTIAL NEPHRECTOMY;  Surgeon: Dutch Gray, MD;  Location[facility];  Service: Urology;  Laterality: Left;   ROTATOR CUFF REPAIR Left 07/2016   SHOULDER ARTHROSCOPY WITH ROTATOR CUFF REPAIR AND SUBACROMIAL DECOMPRESSION Right 05/27/2020   Procedure: RIGHT SHOULDER ARTHROSCOPY WITH EXTENSIVE DEBRIDEMENT, DISTAL CLAVICLE EXCISION AND SUBACROMIAL DECOMPRESSION;  Surgeon: Leandrew Koyanagi, MD;  Location: [place];  Service: Orthopedics;  Laterality: Right;   TEE WITH CARDIOVERSION  12/27/2020   TEE WITHOUT CARDIOVERSION N/A 12/27/2020   Procedure: TRANSESOPHAGEAL ECHOCARDIOGRAM (TEE);  Surgeon: Skeet Latch, MD;  Location: [facility];  Service: Cardiovascular;  Laterality: N/A;   TONSILLECTOMY  70  (age 42)   TOTAL KNEE ARTHROPLASTY Left 02/07/2017    Procedure: LEFT TOTAL KNEE ARTHROPLASTY;  Surgeon: Leandrew Koyanagi, MD;  Location: [place];  Service: Orthopedics;  Laterality: Left;   TOTAL KNEE ARTHROPLASTY Right 04/25/2017   Procedure: RIGHT TOTAL KNEE ARTHROPLASTY;  Surgeon: Leandrew Koyanagi, MD;  Location: [place];  Service: Orthopedics;  Laterality: Right;   TOTAL KNEE REVISION Left 07/13/2022   Procedure: LEFT TOTAL KNEE REVISION, TIBIAL COMPONENT;  Surgeon: Leandrew Koyanagi, MD;  Location: [place];  Service: Orthopedics;  Laterality: Left;   TRANSURETHRAL RESECTION OF PROSTATE N/A 01/04/2017   Procedure: TRANSURETHRAL RESECTION OF THE PROSTATE (TURP);  Surgeon: Ardis Hughs, MD;  [facility];  Service: Urology;  Laterality: N/A;   Social History   Socioeconomic History   Marital status: Married    Spouse name: Not on file   Number of children: 2   Years of education: 16   Highest education level: Not on file  Occupational History   Occupation: Government social research officer, Psychologist, occupational  Tobacco Use   Smoking status: Former    Packs/day: 1.00    Years: 6.00    Total pack years: 6.00    Types: Cigarettes    Quit date: 10/31/1969    Years since quitting: 53.2   Smokeless tobacco: Never  Vaping Use   Vaping Use: Never used  Substance and Sexual Activity   Alcohol use: Yes    Alcohol/week: 0.0 standard drinks of alcohol    Comment: OCCASIONAL   Drug use: No   Sexual activity: Never  Other Topics Concern   Not on file  Social History Narrative   Regular exercise-no   Social Determinants of Health   Financial Resource Strain: Not on file  Food Insecurity: Not on file  Transportation Needs: Not on file  Physical Activity: Not on file  Stress: Not on file  Social Connections: Not on file  Intimate Partner Violence: Not on file   Current Outpatient Medications on File Prior to Visit  Medication Sig Dispense Refill   dapagliflozin propanediol (FARXIGA) 10 MG TABS tablet Take 1 tablet (10 mg total) by mouth daily before breakfast.  90 tablet 3   insulin degludec (TRESIBA FLEXTOUCH) 200 UNIT/ML FlexTouch Pen Inject 90 Units into the skin daily. 27 mL 3   Insulin Pen Needle (PEN NEEDLES) 30G X 8 MM MISC 1 each by Does not apply route daily. E11.9 90 each 0   Lancets (ONETOUCH DELICA PLUS 123XX123) MISC 1 each by Other route 2 (two) times daily. E11.9 200 each 3   methocarbamol (ROBAXIN)  750 MG tablet Take 1 tablet (750 mg total) by mouth 2 (two) times daily as needed for muscle spasms. 30 tablet 3   metoprolol succinate (TOPROL-XL) 50 MG 24 hr tablet TAKE 1 TABLET DAILY, TAKE WITH OR IMMEDIATELY FOLLOWING A MEAL 90 tablet 3   nitroGLYCERIN (NITROSTAT) 0.4 MG SL tablet Place 0.4 mg under the tongue every 5 (five) minutes x 3 doses as needed for chest pain.     ONETOUCH ULTRA test strip USE 1 STRIP TWICE A DAY 200 strip 3   rosuvastatin (CRESTOR) 20 MG tablet TAKE 1 TABLET DAILY 90 tablet 3   sacubitril-valsartan (ENTRESTO) 97-103 MG Take 1 tablet by mouth 2 (two) times daily. 180 tablet 3   Semaglutide,0.25 or 0.'5MG'$ /DOS, 2 MG/3ML SOPN Inject 0.5 mg into the skin once a week. 9 mL 3   spironolactone (ALDACTONE) 25 MG tablet TAKE ONE-HALF (1/2) TABLET DAILY 45 tablet 3   trolamine salicylate (ASPERCREME) 10 % cream Apply 1 application  topically 3 (three) times daily as needed for muscle pain.     No current facility-administered medications on file prior to visit.   Allergies  Allergen Reactions   Actos [Pioglitazone] Swelling and Cough    SWELLING REACTION UNSPECIFIED  EDEMA   Family History  Problem Relation Age of Onset   Arthritis Father    Hypertension Father    Diabetes Father    Arthritis Mother    Hypertension Mother    Heart attack Brother 54   Diabetes Brother    Lymphoma Paternal Grandfather    Cancer Maternal Uncle    Cancer Paternal Uncle        type unknown   PE: There were no vitals taken for this visit. Wt Readings from Last 3 Encounters:  01/22/23 229 lb (103.9 kg)  01/22/23 229 lb (103.9  kg)  01/04/23 222 lb (100.7 kg)   Constitutional: overweight, in NAD Eyes: no exophthalmos ENT: no thyromegaly, no cervical lymphadenopathy Cardiovascular: RRR, No RG, + 1/6 SEM Respiratory: CTA B Musculoskeletal: no deformities Skin: + rashes - stasis dermatitis on B legs Neurological: no tremor with outstretched hands  ASSESSMENT: 1. DM2, insulin-dependent, uncontrolled, with complications - CAd - NICMP - mild CKD  No family history of medullary thyroid cancer or personal history of pancreatitis.  2. HL  PLAN:  1. Patient with longstanding, uncontrolled, type 2 diabetes, on oral antidiabetic regimen with SGLT2 inhibitor, also long-acting insulin and weekly GLP-1 receptor agonist, started at last visit.  He was treated 3 on Rybelsus and tolerated well, but this was not refilled before our first visit so he came off.  Sugars were high at last visit so I suggested not to start back on Rybelsus but on Ozempic and increase the dose as tolerated.  I also advised him to switch from [place] to [place].  He was planning to retire this year and we discussed that this may help with his diabetes control.  At last visit, HbA1c was 8.7%, higher.  - I suggested to:  Patient Instructions  Please continue: - Farxiga 10 mg in am - Tresiba 90 units daily - Ozempic 0.5 mg weekly  Please return in 3-4 months with your sugar log.   - we checked his HbA1c: 7%  - advised to check sugars at different times of the day - 1x a day, rotating check times - advised for yearly eye exams >> he is UTD - return to clinic in 3-4 months  2. HL -Reviewed latest lipid panel  from 08/2022: LDL close to our target of 55 or less due to history of cardiovascular disease, HDL slightly low: Lab Results  Component Value Date   CHOL 114 09/07/2022   HDL 36 (L) 09/07/2022   LDLCALC 57 09/07/2022   TRIG 112 09/07/2022   CHOLHDL 3.2 09/07/2022  -He continues on Crestor 20 mg daily without side  effects  Philemon Kingdom, MD PhD [facility]

## 2023-01-30 ENCOUNTER — Encounter: Payer: Self-pay | Admitting: Internal Medicine

## 2023-01-30 ENCOUNTER — Ambulatory Visit: Payer: BC Managed Care – PPO | Admitting: Internal Medicine

## 2023-01-30 VITALS — BP 130/78 | HR 71 | Ht 71.0 in | Wt 232.4 lb

## 2023-01-30 DIAGNOSIS — E1165 Type 2 diabetes mellitus with hyperglycemia: Secondary | ICD-10-CM

## 2023-01-30 DIAGNOSIS — E1159 Type 2 diabetes mellitus with other circulatory complications: Secondary | ICD-10-CM | POA: Diagnosis not present

## 2023-01-30 DIAGNOSIS — E785 Hyperlipidemia, unspecified: Secondary | ICD-10-CM | POA: Diagnosis not present

## 2023-01-30 LAB — POCT GLYCOSYLATED HEMOGLOBIN (HGB A1C): Hemoglobin A1C: 7.6 % — AB (ref 4.0–5.6)

## 2023-01-30 MED ORDER — DAPAGLIFLOZIN PROPANEDIOL 10 MG PO TABS: 10.0000 mg | ORAL_TABLET | Freq: Every day | ORAL | 3 refills | Status: AC

## 2023-01-30 NOTE — Patient Instructions (Addendum)
Please continue: - Farxiga 10 mg in am - Tresiba 90 units daily - Ozempic 0.5 mg weekly  Please return in August.

## 2023-02-14 ENCOUNTER — Telehealth: Payer: Self-pay

## 2023-02-14 NOTE — Telephone Encounter (Signed)
I called pt to introduce myself as the Prostate Nurse Navigator and the Coordinator of the Prostate [place].   1. I confirmed with the patient he is aware of his referral to the clinic 4/23, arriving @ 12:45 pm.    2. I discussed the format of the clinic and the physicians he will be seeing that day.   3. I discussed where the clinic is located and how to contact me.   4. I confirmed his address and informed him I would be mailing a packet of information and forms to be completed. I asked him to bring them with him the day of his appointment.    He voiced understanding of the above. I asked him to call me if he has any questions or concerns regarding his appointments or the forms he needs to complete.

## 2023-02-19 ENCOUNTER — Ambulatory Visit (HOSPITAL_COMMUNITY): Payer: BC Managed Care – PPO | Attending: Cardiology

## 2023-02-19 ENCOUNTER — Other Ambulatory Visit (HOSPITAL_COMMUNITY): Payer: Self-pay | Admitting: Urology

## 2023-02-19 DIAGNOSIS — I5022 Chronic systolic (congestive) heart failure: Secondary | ICD-10-CM | POA: Insufficient documentation

## 2023-02-19 DIAGNOSIS — C61 Malignant neoplasm of prostate: Secondary | ICD-10-CM

## 2023-02-19 DIAGNOSIS — Q231 Congenital insufficiency of aortic valve: Secondary | ICD-10-CM

## 2023-02-19 LAB — ECHOCARDIOGRAM COMPLETE
AR max vel: 1.49 cm2
AV Area VTI: 1.62 cm2
AV Area mean vel: 1.49 cm2
AV Mean grad: 19 mmHg
AV Peak grad: 36.1 mmHg
Ao pk vel: 3 m/s
Area-P 1/2: 3.65 cm2
P 1/2 time: 524 msec
S' Lateral: 4.2 cm

## 2023-03-11 ENCOUNTER — Encounter (HOSPITAL_COMMUNITY)
Admission: RE | Admit: 2023-03-11 | Discharge: 2023-03-11 | Disposition: A | Discharge status: Home / Self Care | Payer: BC Managed Care – PPO | Source: Ambulatory Visit | Attending: Urology | Admitting: Urology

## 2023-03-11 DIAGNOSIS — C61 Malignant neoplasm of prostate: Secondary | ICD-10-CM

## 2023-03-11 MED ORDER — PIFLIFOLASTAT F 18 (PYLARIFY) INJECTION
9.0000 | Freq: Once | INTRAVENOUS | Status: AC
  Administered 2023-03-11: 9.13 via INTRAVENOUS

## 2023-03-11 NOTE — Progress Notes (Unsigned)
                               Care Plan Summary  Name: Matthew Schmitt  DOB: 1951/02/09   Your Medical Team:   Urologist -  Dr. Heloise Purpura, [facility]  Radiation Oncologist - Dr. Margaretmary Dys, [facility]     Recommendations: 1) Surgery     * These recommendations are based on information available as of today's consult.      Recommendations may change depending on the results of further tests or exams.    Next Steps: 1) [facility] will contact you with appointments.   When appointments need to be scheduled, you will be contacted by [facility] and/or [facility].  Questions?  Please do not hesitate to call Cherlyn Cushing, BSN, RN at [phone] with any questions or concerns.  Marisue Ivan is your Oncology Nurse Navigator and is available to assist you while you're receiving your medical care at [facility].

## 2023-03-11 NOTE — Progress Notes (Signed)
RN left voicemail to remind patient of upcoming [facility] appointment.  Contact information left for any questions.

## 2023-03-12 ENCOUNTER — Other Ambulatory Visit: Payer: Self-pay | Admitting: Genetic Counselor

## 2023-03-12 ENCOUNTER — Encounter: Payer: Self-pay | Admitting: Radiation Oncology

## 2023-03-12 ENCOUNTER — Ambulatory Visit
Admission: RE | Admit: 2023-03-12 | Discharge: 2023-03-12 | Disposition: A | Discharge status: Home / Self Care | Payer: BC Managed Care – PPO | Source: Ambulatory Visit | Attending: Radiation Oncology | Admitting: Radiation Oncology

## 2023-03-12 ENCOUNTER — Other Ambulatory Visit: Payer: Self-pay

## 2023-03-12 ENCOUNTER — Inpatient Hospital Stay: Payer: BC Managed Care – PPO | Attending: Radiation Oncology | Admitting: Genetic Counselor

## 2023-03-12 ENCOUNTER — Inpatient Hospital Stay: Payer: BC Managed Care – PPO

## 2023-03-12 VITALS — BP 162/78 | HR 75 | Temp 97.0°F | Resp 18 | Ht 71.0 in | Wt 226.4 lb

## 2023-03-12 DIAGNOSIS — Z8042 Family history of malignant neoplasm of prostate: Secondary | ICD-10-CM | POA: Diagnosis not present

## 2023-03-12 DIAGNOSIS — Z1379 Encounter for other screening for genetic and chromosomal anomalies: Secondary | ICD-10-CM

## 2023-03-12 DIAGNOSIS — C61 Malignant neoplasm of prostate: Secondary | ICD-10-CM

## 2023-03-12 LAB — GENETIC SCREENING ORDER

## 2023-03-12 NOTE — Consult Note (Signed)
[facility]     03/12/2023   --------------------------------------------------------------------------------   Rolly Salter  MRN: [id number]  DOB: Oct 30, 1951, 72 year old Male  SSN: [id number]   PRIMARY CARE:  Maura L. Hamrick, MD  PRIMARY CARE FAX:  [phone]  REFERRING:  Crist Fat, MD  PROVIDER:  Berniece Salines, M.D.  TREATING:  Heloise Purpura, M.D.  LOCATION:  [facility] [phone]     --------------------------------------------------------------------------------   CC/HPI: CC: Prostate Cancer   Physician requesting consult: Dr. Cordella Register  PCP: Dr. Burnell Blanks  Location of consult: [facility] - [facility]   Mr. Udell is a 72 year old gentleman with a complex medical and urologic history. He has a history of atrial fibrillation/atrial flutter resulting in an acute episode of atrial flutter in 2022 with an estimated EF of 20-25%. He was anticoagulated and medically treated with subsequent cardiac ablation. His EF improved to 50-55% post-ablation and he is now off anticoagulation. He also has mild to moderate aortic stenosis and a dilated aortic root that are being monitored. He also had DM, hyperlipidemia, and peripheral vascular disease. His urologic history includes BPH s/p prior TURP by Dr. Isabel Caprice in the past. He also had a left renal tumor that I had performed a partial nephrectomy on in 2014 for what turned out to be an oncocytoma. He also has an extensive history of urolithiasis and bladder stones s/p multiple procedures including PCNL, ureteroscopy, and cystolithalopaxy.   He was noted to have an elevated PSA of 7.41 and a left lateral prostate nodule by Dr. Marlou Porch. He underwent an MRI of the prostate on 01/19/23 that demonstrated an 8 mm PI-RADS 4 lesion at the right mid peripheral zone, an 8 mm PI-RADS 4 lesion of the left mid peripheral zone, and a 1.4 cm PI-RADS 4 lesion of the  left apex transition zone and peripheral zone. An MR/US fusion biopsy was performed on 01/31/23 that confirmed Gleason 5+4=9 adenocarcinoma of the prostate with a total of 16 out of 20 biopsies positive for malignancy including all targeted biopsies. PSMA PET imaging was performed on 03/10/23. This was negative for metastatic disease.   Family history: None.   Imaging studies:  MRI (01/19/23): No EPE, SVI, LAD, or bone lesions.  PSMA PET scan (03/10/2023): This was negative for metastatic disease.   PMH: He has a history of atrial fibrillation/atrial flutter s/p ablation (off anticoagulation) followed by Dr. Donato Schultz. He has DM, hyperlipidemia, and peripheral vascular disease.  PSH: TURP and left robotic partial nephrectomy.   TNM stage: cT2a N0 M0  PSA: 7.41  Gleason score: 5+4=9 (GG 5)  Biopsy (01/31/23): 16/20 cores positive  Left: L apex (90%, 4+3=7), L lateral mid (60%, 4+5=9), L mid (70%, 5+4=9), L lateral base (5%, 4+4=8)  Right: R apex (5%, 3+4=7, PNI), R mid (5%, 3+3=6), R lateral mid (10%, 3+3=6), R base (30%, 4+3=7)  ROI 1: 3/3 cores positive (4+3 = 7 -50%, 40%, 40%)  ROI 2: 2/3 cores positive (4+5 = 9 -80%, 80%)  ROI-3: 3/3 cores positive (4+5 = 9 -50%, 50%, 40%)  Prostate volume: 30.5 cc   Nomogram  OC disease: 4%  EPE: 95%  SVI: 64%  LNI: 60%  PFS (5 year, 10 year): 21%, 13%   Urinary function: IPSS is 4. He is s/p TURP in the past.  Erectile function: SHIM score is N/A. He has not been sexually active for over 15 years. This is a  very low priority for him.     ALLERGIES: No Allergies    MEDICATIONS: Metoprolol Succinate 50 mg tablet, extended release 24 hr  Amlodipine Besylate 5 mg tablet Oral  Aspercreame  Entresto 49 mg-51 mg tablet  Farxiga 10 mg tablet  Nitroglycerin 0.4 mg tablet, sublingual  Ozempic 0.25 mg or 0.5 mg dose (2 mg/3 ml) pen injector  Rosuvastatin Calcium 10 mg tablet  Rybelsus 3 mg tablet  Spironolactone 25 mg tablet  Tresiba  Xarelto 20  mg tablet     GU PSH: Cysto Bladder Stone <2.5cm - 2016 Cysto Bladder Stone >2.5cm - 2018 Cysto Uretero Lithotripsy - 2013 Cystoscopy - 2021, 2017 Cystoscopy Insert Stent - 2013 Cystoscopy TURP - 2018 Locm 300-399Mg /Ml Iodine,1Ml - 2020 Partial nephrectomy (laparoscopic) - 2014 Percut Stone Removal >2cm - 2015 Prostate Needle Biopsy - 01/31/2023 Ureteroscopic laser litho, Right - 2020       PSH Notes: Knee Surgery   NON-GU PSH: Back Surgery (Unspecified) - 2009 Remove Tonsils - 2009 Shoulder Arthroscopy/surgery, Left - 2017 Surgical Pathology, Gross And Microscopic Examination For Prostate Needle - 01/31/2023     GU PMH: Prostate Cancer - 02/07/2023 Elevated PSA - 01/31/2023, - 12/14/2022, - 07/10/2022 Encounter for Prostate Cancer screening - 12/14/2022, - 2019 Weak Urinary Stream - 12/14/2022, - 2017 Renal calculus - 2020, - 2019, Nonobstructive, and asymptomatic currently., - 2018, - 2017, Nephrolithiasis, - 2016 Urinary Urgency - 2017 Flank Pain (Acute), Right, Ketorolac 30 mg IM PRN NSAID - 2017 Acute Cystitis/UTI - 2017 Bladder Stone, Bladder calculus - 2016 Urinary Tract Inf, Unspec site, Pyuria - 2016 BPH w/LUTS, Benign prostatic hyperplasia with urinary obstruction - 2016 Urinary Frequency, Increased urinary frequency - 2016 Abdominal Pain Unspec, Left flank pain - 2016 Benign tumor right kidney, Renal oncocytoma, right - 2015 Benign Neo Kidney, Unspec, Renal oncocytoma, unspecified laterality - 2015 Gross hematuria, Gross hematuria - 2015 ED due to arterial insufficiency, Erectile dysfunction due to arterial insufficiency - 2014 Ureteral calculus, Calculus of ureter - 2014      PMH Notes: 2015 - right PCNL for 3cm stone  2016 - cystolithalopaxy  2018 - s/p TURP and cystolithalopaxy- three large bladder stones removed.   NON-GU PMH: Encounter for general adult medical examination without abnormal findings, Encounter for preventive health examination - 2016 Personal  history of other diseases of the circulatory system, History of hypertension - 2014 Personal history of other endocrine, nutritional and metabolic disease, History of diabetes mellitus - 2014 Unspecified atrial fibrillation, Atrial Fibrillation - 2014    FAMILY HISTORY: Diabetes - Runs In Family Hypertension - Runs In Family Kidney Cancer - No Family History   SOCIAL HISTORY: Marital Status: Married Preferred Language: English; Ethnicity: Not Hispanic Or Latino; Race: White Current Smoking Status: Patient does not smoke anymore. Has not smoked since 04/19/1996.  Does drink.  Drinks 1 caffeinated drink per day.    REVIEW OF SYSTEMS:    GU Review Male:   Patient denies frequent urination, hard to postpone urination, burning/ pain with urination, get up at night to urinate, leakage of urine, stream starts and stops, trouble starting your streams, and have to strain to urinate .  Gastrointestinal (Lower):   Patient denies diarrhea and constipation.  Gastrointestinal (Upper):   Patient denies nausea and vomiting.  Constitutional:   Patient denies fever, night sweats, weight loss, and fatigue.  Skin:   Patient denies skin rash/ lesion and itching.  Eyes:   Patient denies blurred vision and double vision.  Ears/  Nose/ Throat:   Patient denies sore throat and sinus problems.  Hematologic/Lymphatic:   Patient denies swollen glands and easy bruising.  Cardiovascular:   Patient denies leg swelling and chest pains.  Respiratory:   Patient denies cough and shortness of breath.  Endocrine:   Patient denies excessive thirst.  Musculoskeletal:   Patient denies back pain and joint pain.  Neurological:   Patient denies headaches and dizziness.  Psychologic:   Patient denies depression and anxiety.   VITAL SIGNS: None   GU PHYSICAL EXAMINATION:    Prostate: Prostate about 50 grams. He does have extensive left-sided induration with nodularity toward the left apex. This is concerning for possible  extraprostatic disease.   MULTI-SYSTEM PHYSICAL EXAMINATION:    Constitutional: Well-nourished. No physical deformities. Normally developed. Good grooming.  Respiratory: No labored breathing, no use of accessory muscles.   Cardiovascular: Normal temperature, normal extremity pulses, no swelling, no varicosities.  Gastrointestinal: No mass, no tenderness, no rigidity, non obese abdomen. Well-healed prior laparoscopic incisions with a small reducible incisional hernia at his prior extraction site for his partial nephrectomy.     Complexity of Data:  Lab Test Review:   PSA  Records Review:   Pathology Reports, Previous Patient Records  X-Ray Review: PET- PSMA Scan: Reviewed Films.     12/14/22 07/10/22 05/17/20 04/15/18  PSA  Total PSA 7.41 ng/mL 4.88 ng/mL 2.66 ng/mL 1.42 ng/mL   Notes:                     CLINICAL DATA: Prostate cancer diagnosed 3 weeks ago. No therapy.  PSA 7.4.   EXAM:  NUCLEAR MEDICINE PET SKULL BASE TO THIGH   TECHNIQUE:  9.1 mCi F18 Piflufolastat (Pylarify) was injected intravenously.  Full-ring PET imaging was performed from the skull base to thigh  after the radiotracer. CT data was obtained and used for attenuation  correction and anatomic localization.   COMPARISON: Prostate MR 01/19/2023. Chest CT 12/31/2017. Abdominal  CT 05/18/2014.   FINDINGS:  NECK   No radiotracer activity in neck lymph nodes.   Incidental CT finding: Bilateral carotid atherosclerosis. No  cervical adenopathy. Left maxillary sinus mucous retention cyst or  polyp.   CHEST   No radiotracer accumulation within mediastinal or hilar lymph nodes.  No suspicious pulmonary nodules on the CT scan.   Incidental CT finding: Aortic and coronary artery calcification.  Mild cardiomegaly. Aortic valve calcification. No cervical  adenopathy.   ABDOMEN/PELVIS   Prostate: Left lateral mid to apical tracer affinity corresponds to  the second and possibly a portion of the third area of  interest on  prior prostate MRI. Example at a S.U.V. max of 13.4.   Lymph nodes: No abnormal radiotracer accumulation within pelvic or  abdominal nodes.   Liver: No evidence of liver metastasis.   Incidental CT finding: Mild hepatic steatosis. Nonspecific caudate  lobe enlargement. Possible irregular hepatic capsule including  within the right lobe on 147/4.   Dependent gallstones.   Mild left adrenal thickening. Normal right adrenal gland. Bilateral  renal collecting system calculi of up to 3-4 mm. Exophytic lower  pole right renal 1.3 cm lesion is favored to represent a cyst or  minimally complex cyst . In the absence of clinically indicated  signs/symptoms require(s) no independent follow-up. Abdominal aortic  atherosclerosis. No abdominopelvic adenopathy.   Small fat containing left inguinal hernia.   Right paramidline small fat containing ventral abdominal wall  hernia.   SKELETON   No focal  activity to suggest skeletal metastasis.   IMPRESSION:  1. Left mid to apical tracer avid prostate primary.  2. No evidence of tracer avid metastatic disease.  3. Mild hepatic steatosis. Possible mild cirrhosis. Correlate with  risk factors.  4. Aortic valvular calcifications. Consider echocardiography to  evaluate for valvular dysfunction.  5. Incidental findings, including: Sinus disease. Aortic  Atherosclerosis (ICD10-I70.0). Cholelithiasis. Nephrolithiasis.    Electronically Signed  By: Jeronimo Greaves M.D.  On: 03/12/2023 12:43   PROCEDURES: None   ASSESSMENT:      ICD-10 Details  1 GU:   Prostate Cancer - C61    PLAN:           Document Letter(s):  Created for Patient: Clinical Summary         Notes:   1. High risk, locally advanced prostate cancer: I did detailed discussion with Mr. Monts and his son today regarding his prostate cancer situation. We reviewed his PSMA PET scan which fortunately was negative for metastatic disease. We reviewed options for therapy of  curative intent and he is scheduled to also see Dr. Kathrynn Running later today.   The patient was counseled about the natural history of prostate cancer and the standard treatment options that are available for prostate cancer. It was explained to him how his age and life expectancy, clinical stage, Gleason score/prognostic grade group, and PSA (and PSA density) affect his prognosis, the decision to proceed with additional staging studies, as well as how that information influences recommended treatment strategies. We discussed the roles for active surveillance, radiation therapy, surgical therapy, androgen deprivation, as well as ablative therapy and other investigational options for the treatment of prostate cancer as appropriate to his individual cancer situation. We discussed the risks and benefits of these options with regard to their impact on cancer control and also in terms of potential adverse events, complications, and impact on quality of life particularly related to urinary and sexual function. The patient was encouraged to ask questions throughout the discussion today and all questions were answered to his stated satisfaction. In addition, the patient was provided with and/or directed to appropriate resources and literature for further education about prostate cancer and treatment options. We discussed surgical therapy for prostate cancer including the different available surgical approaches. We discussed, in detail, the risks and expectations of surgery with regard to cancer control, urinary control, and erectile function as well as the expected postoperative recovery process. Additional risks of surgery including but not limited to bleeding, infection, hernia formation, nerve damage, lymphocele formation, bowel/rectal injury potentially necessitating colostomy, damage to the urinary tract resulting in urine leakage, urethral stricture, and the cardiopulmonary risks such as myocardial infarction, stroke,  death, venothromboembolism, etc. were explained. The risk of open surgical conversion for robotic/laparoscopic prostatectomy was also discussed.   He is adamant that he wishes to proceed with primary surgical therapy. He understands the high likelihood that he may require multimodality therapy in the adjuvant/salvage setting despite primary surgical therapy. He will tentatively be scheduled for a nonnerve sparing robot-assisted laparoscopic radical prostatectomy and bilateral pelvic lymphadenectomy. He will need cardiac clearance per Dr. Anne Fu considering his fairly extensive cardiac history.   CC: Dr. Sharman Crate Hamrick  Dr. Berniece Salines  Dr. Margaretmary Dys  Dr. Donato Schultz    E & M CODES: We spent 63 minutes dedicated to evaluation and management time, including face to face interaction, discussions on coordination of care, documentation, result review, and discussion with others as applicable.

## 2023-03-12 NOTE — Progress Notes (Signed)
Radiation Oncology         [phone] ________________________________  Multidisciplinary Prostate Cancer Clinic  Initial Radiation Oncology Consultation  Name: Matthew Schmitt MRN: [id number]  Date: 03/12/2023  DOB: 1951-11-18  WU:[id number], Durward Fortes, MD  Crist Fat, MD   REFERRING PHYSICIAN: Crist Fat, MD  DIAGNOSIS: 72 y.o. gentleman with stage T2a adenocarcinoma of the prostate with a Gleason's score of 5+4 and a PSA of 7.41    ICD-10-CM   1. Malignant neoplasm of prostate  C61       HISTORY OF PRESENT ILLNESS::Matthew Schmitt is a 72 y.o. gentleman. He is an established urology patient with a longstanding history of, renal and bladder stones, with lithotripsy and stent placement in 2013, TURP in 2018, and partial nephrectomy in 2014 for a benign renal oncocytoma. He has been followed by Dr. Marlou Porch since 2018. He was noted to have an elevated PSA of 4.1 on routine labs as part of his employment physical exam in 04/2022.  This increased to 4.88 when repeated in 06/2022 and increased further to 7.41 in 11/2022. Digital rectal examination was performed at follow up on 12/12/22 showing a 1 cm asymmetric area on the lateral left lobe.  This prompted prostate MRI on 01/19/23 which showed three PI-RADS 4 lesions in the prostate gland-- #1 in the right posterolateral peripheral zone in the mid gland, #2 in the left posterolateral peripheral zone in the mid gland, and #3 in the left posterior transition zone and left posterolateral peripheral zone at the apex. The patient proceeded to MRI fusion biopsy of the prostate on 01/31/23.  The prostate volume measured 30 cc.  Out of 20 core biopsies, 16 were positive.  The maximum Gleason score was 5+4, and this was seen in the left mid. Additionally, Gleason 4+5 was seen in both cores from MRI ROI #2, all three cores from MRI ROI #3, and left mid lateral, Gleason 4+4 in the left base lateral (small focus), Gleason 4+3 in all three cores from  MRI ROI #1 (with perineural invasion), left apex, and right base, Gleason 3+4 in the right apex (small focus, with PNI), and Gleason 3+3 in the right mid lateral and right mid (small focus).      He underwent staging PSMA PET scan on 03/11/23 which showed left mid to apical tracer-avid prostate primary but no evidence of tracer-avid metastatic disease. Incidental findings include mild hepatic steatosis, possible mild cirrhosis; aortic valvular calcifications; sinus disease and nephrolithiasis.  The patient reviewed the biopsy and imaging results with his urologist and he has kindly been referred today to the multidisciplinary prostate cancer clinic for presentation of pathology and radiology studies in our conference for discussion of potential radiation treatment options and clinical evaluation.  PREVIOUS RADIATION THERAPY: No  PAST MEDICAL HISTORY:  has a past medical history of Anxiety, Arthritis, Arthrosis of right acromioclavicular joint (01/29/2020), Atrial flutter (HCC), BPH associated with nocturia, BPH with obstruction/lower urinary tract symptoms (01/04/2017), CAD (coronary artery disease) (04/02/2013), Diabetes (HCC) (01/18/2018), Dilated aortic root (HCC), Encounter for long-term (current) use of other medications (07/27/2013), Essential hypertension, benign, First degree heart block, Heart murmur, History of bilateral knee replacement (02/07/2017), History of bladder stone, History of kidney stones, History of melanoma excision, Hypertensive heart disease (04/02/2013), Impingement syndrome of right shoulder (01/29/2020), Impotence of organic origin, Left-sided low back pain with left-sided sciatica (01/23/2019), Long term current use of anticoagulant therapy, Mixed hyperlipidemia, Multiple renal cysts, Nephrolithiasis (05/17/2014), Nonischemic cardiomyopathy (HCC), Osteoarthritis, PAF (paroxysmal  atrial fibrillation) (HCC), RBBB, RBBB (right bundle branch block with left anterior fascicular  block) (04/02/2013), Renal oncocytoma of left kidney (06/18/2013), Right ureteral stone, Rotator cuff syndrome of left shoulder (04/17/2016), Status post total left knee replacement (01/01/2020), Tendinopathy of right biceps tendon (01/29/2020), Tendinopathy of right rotator cuff (01/29/2020), Type 2 diabetes mellitus treated with insulin (HCC), and Wears glasses.    PAST SURGICAL HISTORY: Past Surgical History:  Procedure Laterality Date   A-FLUTTER ABLATION N/A 10/27/2021   Procedure: A-FLUTTER ABLATION;  Surgeon: Lanier Prude, MD;  Location: [facility] INVASIVE CV LAB;  Service: Cardiovascular;  Laterality: N/A;   CARDIAC CATHETERIZATION  08-17-2011  DR Karleen Hampshire TILLEY   POSSIBLE MODERATE LAD STENOSIS JUST AFTER THE BIFURCATION OF LARGE DIAGONAL BRANCH/ LVEF  40-45%/ MILD GLOBAL HYPODINESIS (CATH DONE FOR ABNORMAL STRESS TEST OF FIXED LATERAL WALL DEFECT & BIFASCICULAR BLAOCK)   CARDIOVERSION N/A 04/02/2013   Procedure: CARDIOVERSION;  Surgeon: Othella Boyer, MD;  Location: [facility] OR;  Service: Cardiovascular;  Laterality: N/A;   CARDIOVERSION N/A 12/27/2020   Procedure: CARDIOVERSION;  Surgeon: Chilton Si, MD;  Location: [facility] ENDOSCOPY;  Service: Cardiovascular;  Laterality: N/A;   CYSTOSCOPY WITH LITHOLAPAXY N/A 05/18/2015   Procedure: CYSTOSCOPY WITH LITHOLAPAXY;  Surgeon: Barron Alvine, MD;  Location: [facility];  Service: Urology;  Laterality: N/A;   CYSTOSCOPY WITH LITHOLAPAXY N/A 01/04/2017   Procedure: CYSTOSCOPY WITH LITHOLAPAXY, Holmium Laser;  Surgeon: Crist Fat, MD;  Location: [facility] ORS;  Service: Urology;  Laterality: N/A;   CYSTOSCOPY WITH RETROGRADE PYELOGRAM, URETEROSCOPY AND STENT PLACEMENT  11/03/2012   Procedure: CYSTOSCOPY WITH RETROGRADE PYELOGRAM, URETEROSCOPY AND STENT PLACEMENT;  Surgeon: Valetta Fuller, MD;  Location: [facility];  Service: Urology;  Laterality: Left;  Cystoscopy/Left Retrograde/Ureteroscopy/Holmium Laser Litho/Double J  Stent   CYSTOSCOPY WITH URETEROSCOPY, STONE BASKETRY AND STENT PLACEMENT Right 05/15/2019   Procedure: CYSTOSCOPY WITH URETEROSCOPY, LASER LITHOTRIPSY STONE BASKETRY AND STENT PLACEMENT, RIGHT RETROGRADE;  Surgeon: Crist Fat, MD;  Location: [facility];  Service: Urology;  Laterality: Right;   HOLMIUM LASER APPLICATION  11/03/2012   Procedure: HOLMIUM LASER APPLICATION;  Surgeon: Valetta Fuller, MD;  Location: [facility];  Service: Urology;  Laterality: Left;   HOLMIUM LASER APPLICATION N/A 05/18/2015   Procedure: HOLMIUM LASER APPLICATION;  Surgeon: Barron Alvine, MD;  Location: [facility];  Service: Urology;  Laterality: N/A;   KNEE SURGERY Bilateral 1978  &  1974   MELANOMA EXCISION Bilateral June 2016   20th-- left leg //  1st-- right leg w/ skin graft from right thigh (no lymph node bx)   NEPHROLITHOTOMY Right 05/17/2014   Procedure: NEPHROLITHOTOMY PERCUTANEOUS;  Surgeon: Valetta Fuller, MD;  Location: [facility];  Service: Urology;  Laterality: Right;   PILONIDAL CYST EXCISION     ROBOTIC ASSITED PARTIAL NEPHRECTOMY Left 06/18/2013   Procedure: ROBOTIC ASSISTED PARTIAL NEPHRECTOMY;  Surgeon: [name], MD;  Location: [facility];  Service: Urology;  Laterality: Left;   ROTATOR CUFF REPAIR Left 07/2016   SHOULDER ARTHROSCOPY WITH ROTATOR CUFF REPAIR AND SUBACROMIAL DECOMPRESSION Right 05/27/2020   Procedure: RIGHT SHOULDER ARTHROSCOPY WITH EXTENSIVE DEBRIDEMENT, DISTAL CLAVICLE EXCISION AND SUBACROMIAL DECOMPRESSION;  Surgeon: Tarry Kos, MD;  Location: [facility];  Service: Orthopedics;  Laterality: Right;   TEE WITH CARDIOVERSION  12/27/2020   TEE WITHOUT CARDIOVERSION N/A 12/27/2020   Procedure: TRANSESOPHAGEAL ECHOCARDIOGRAM (TEE);  Surgeon: Chilton Si, MD;  Location: [facility] ENDOSCOPY;  Service: Cardiovascular;  Laterality:  N/A;   TONSILLECTOMY  22  (age 18)   TOTAL KNEE ARTHROPLASTY Left 02/07/2017   Procedure: LEFT TOTAL  KNEE ARTHROPLASTY;  Surgeon: Tarry Kos, MD;  Location: [facility] OR;  Service: Orthopedics;  Laterality: Left;   TOTAL KNEE ARTHROPLASTY Right 04/25/2017   Procedure: RIGHT TOTAL KNEE ARTHROPLASTY;  Surgeon: Tarry Kos, MD;  Location: [facility] OR;  Service: Orthopedics;  Laterality: Right;   TOTAL KNEE REVISION Left 07/13/2022   Procedure: LEFT TOTAL KNEE REVISION, TIBIAL COMPONENT;  Surgeon: Tarry Kos, MD;  Location: [facility] OR;  Service: Orthopedics;  Laterality: Left;   TRANSURETHRAL RESECTION OF PROSTATE N/A 01/04/2017   Procedure: TRANSURETHRAL RESECTION OF THE PROSTATE (TURP);  Surgeon: Crist Fat, MD;  Location: [facility]S;  Service: Urology;  Laterality: N/A;    FAMILY HISTORY: family history includes Arthritis in his father and mother; Cancer in his maternal uncle and paternal uncle; Diabetes in his brother and father; Heart attack (age of onset: 24) in his brother; Hypertension in his father and mother; Lymphoma in his paternal grandfather.  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  reports that he quit smoking about 53 years ago. His smoking use included cigarettes. He has a 6.00 pack-year smoking history. He has never used smokeless tobacco. He reports current alcohol use. He reports that he does not use drugs.  ALLERGIES: Actos [pioglitazone]  MEDICATIONS:  Current Outpatient Medications  Medication Sig Dispense Refill   dapagliflozin propanediol (FARXIGA) 10 MG TABS tablet Take 1 tablet (10 mg total) by mouth daily before breakfast. 90 tablet 3   insulin degludec (TRESIBA FLEXTOUCH) 200 UNIT/ML FlexTouch Pen Inject 90 Units into the skin daily. 27 mL 3   Insulin Pen Needle (PEN NEEDLES) 30G X 8 MM MISC 1 each by Does not apply route daily. E11.9 90 each 0   Lancets (ONETOUCH DELICA PLUS LANCET33G) MISC 1 each by Other route 2 (two) times daily. E11.9 200 each 3   methocarbamol (ROBAXIN) 750 MG tablet Take 1 tablet (750 mg  total) by mouth 2 (two) times daily as needed for muscle spasms. 30 tablet 3   metoprolol succinate (TOPROL-XL) 50 MG 24 hr tablet TAKE 1 TABLET DAILY, TAKE WITH OR IMMEDIATELY FOLLOWING A MEAL 90 tablet 3   nitroGLYCERIN (NITROSTAT) 0.4 MG SL tablet Place 0.4 mg under the tongue every 5 (five) minutes x 3 doses as needed for chest pain.     ONETOUCH ULTRA test strip USE 1 STRIP TWICE A DAY 200 strip 3   rosuvastatin (CRESTOR) 20 MG tablet TAKE 1 TABLET DAILY 90 tablet 3   sacubitril-valsartan (ENTRESTO) 97-103 MG Take 1 tablet by mouth 2 (two) times daily. 180 tablet 3   Semaglutide,0.25 or 0.5MG /DOS, 2 MG/3ML SOPN Inject 0.5 mg into the skin once a week. 9 mL 3   spironolactone (ALDACTONE) 25 MG tablet TAKE ONE-HALF (1/2) TABLET DAILY 45 tablet 3   trolamine salicylate (ASPERCREME) 10 % cream Apply 1 application  topically 3 (three) times daily as needed for muscle pain.     No current facility-administered medications for this encounter.    REVIEW OF SYSTEMS:  On review of systems, the patient reports that he is doing well overall. He denies any chest pain, shortness of breath, cough, fevers, chills, night sweats, unintended weight changes. He denies any bowel disturbances, and denies abdominal pain, nausea or vomiting. He denies any new musculoskeletal or joint aches or pains. His IPSS  was 4, indicating mild urinary symptoms. He is not currently sexually active. A complete review of systems is obtained and is otherwise negative.   PHYSICAL EXAM:  Wt Readings from Last 3 Encounters:  01/30/23 232 lb 6.4 oz (105.4 kg)  01/22/23 229 lb (103.9 kg)  01/22/23 229 lb (103.9 kg)   Temp Readings from Last 3 Encounters:  01/04/23 97.8 F (36.6 C) (Oral)  10/24/22 98.1 F (36.7 C) (Temporal)  07/26/22 98.6 F (37 C)   BP Readings from Last 3 Encounters:  01/30/23 130/78  01/22/23 (!) 140/68  01/22/23 (!) 140/68   Pulse Readings from Last 3 Encounters:  01/30/23 71  01/22/23 74  01/22/23  74    /10  In general this is a well appearing Caucasian man in no acute distress. He's alert and oriented x4 and appropriate throughout the examination. Cardiopulmonary assessment is negative for acute distress and he exhibits normal effort.    KPS = 100  100 - Normal; no complaints; no evidence of disease. 90   - Able to carry on normal activity; minor signs or symptoms of disease. 80   - Normal activity with effort; some signs or symptoms of disease. 55   - Cares for self; unable to carry on normal activity or to do active work. 60   - Requires occasional assistance, but is able to care for most of his personal needs. 50   - Requires considerable assistance and frequent medical care. 40   - Disabled; requires special care and assistance. 30   - Severely disabled; hospital admission is indicated although death not imminent. 20   - Very sick; hospital admission necessary; active supportive treatment necessary. 10   - Moribund; fatal processes progressing rapidly. 0     - Dead  Karnofsky DA, Abelmann WH, Craver LS and Burchenal JH [phone]) The use of the nitrogen mustards in the palliative treatment of carcinoma: with particular reference to bronchogenic carcinoma Cancer 1 [phone]   LABORATORY DATA:  Lab Results  Component Value Date   WBC 9.2 07/14/2022   HGB 14.0 07/14/2022   HCT 43.0 07/14/2022   MCV 86.3 07/14/2022   PLT 141 (L) 07/14/2022   Lab Results  Component Value Date   NA 142 07/10/2022   K 4.1 07/10/2022   CL 111 07/10/2022   CO2 23 07/10/2022   Lab Results  Component Value Date   ALT 16 09/07/2022   AST 16 09/07/2022   ALKPHOS 97 09/07/2022   BILITOT 0.4 09/07/2022     RADIOGRAPHY: ECHOCARDIOGRAM COMPLETE  Result Date: 02/19/2023    ECHOCARDIOGRAM REPORT   Patient Name:   Matthew Schmitt Date of Exam: 02/19/2023 Medical Rec #:  [id number]      Height:       71.0 in Accession #:    [id number]     Weight:       232.4 lb Date of Birth:  [birth year]     BSA:           2.247 m Patient Age:    71 years       BP:           140/68 mmHg Patient Gender: M              HR:           67 bpm. Exam Location:  [address] Procedure: 2D Echo, Cardiac Doppler and Color Doppler Indications:    I35.0 AS  History:  Patient has prior history of Echocardiogram examinations, most                 recent 12/22/2021. CHF, CAD, Abnormal ECG, AS, Arrythmias:Atrial                 Flutter, Atrial Fibrillation and RBBB, Signs/Symptoms:Bicuspid                 aortic valve; Risk Factors:Hypertension, Diabetes, Dyslipidemia                 and Former Smoker.  Sonographer:    Samule Ohm RDCS Referring Phys: [id number] MARK C SKAINS IMPRESSIONS  1. Left ventricular ejection fraction, by estimation, is 45 to 50%. The left ventricle has mildly decreased function. The left ventricle demonstrates global hypokinesis. There is mild left ventricular hypertrophy of the basal-septal segment. Left ventricular diastolic parameters are consistent with Grade II diastolic dysfunction (pseudonormalization). Elevated left atrial pressure.  2. Right ventricular systolic function is normal. The right ventricular size is normal. Tricuspid regurgitation signal is inadequate for assessing PA pressure.  3. Left atrial size was severely dilated.  4. The mitral valve is normal in structure. Mild mitral valve regurgitation. No evidence of mitral stenosis.  5. The aortic valve has an indeterminant number of cusps. There is moderate calcification of the aortic valve. There is moderate thickening of the aortic valve. Aortic valve regurgitation is mild to moderate. Moderate aortic valve stenosis. Aortic regurgitation PHT measures 524 msec. Aortic valve area, by VTI measures 1.62 cm. Aortic valve mean gradient measures 19.0 mmHg. Aortic valve Vmax measures 3.00 m/s. DVI 0.33.  6. The ascending aortic dimensions are 4.2cm and when indexed to BSA and gender, this is in the normal range.  7. The inferior vena cava is normal in size  with greater than 50% respiratory variability, suggesting right atrial pressure of 3 mmHg.  8. Compared to study dated 12/22/30, no significant change in degree of AS. FINDINGS  Left Ventricle: Left ventricular ejection fraction, by estimation, is 45 to 50%. The left ventricle has mildly decreased function. The left ventricle demonstrates global hypokinesis. The left ventricular internal cavity size was normal in size. There is  mild left ventricular hypertrophy of the basal-septal segment. Abnormal (paradoxical) septal motion, consistent with left bundle branch block. Left ventricular diastolic parameters are consistent with Grade II diastolic dysfunction (pseudonormalization). Elevated left atrial pressure. Right Ventricle: The right ventricular size is normal. No increase in right ventricular wall thickness. Right ventricular systolic function is normal. Tricuspid regurgitation signal is inadequate for assessing PA pressure. Left Atrium: Left atrial size was severely dilated. Right Atrium: Right atrial size was normal in size. Pericardium: There is no evidence of pericardial effusion. Mitral Valve: The mitral valve is normal in structure. Mild mitral valve regurgitation. No evidence of mitral valve stenosis. Tricuspid Valve: The tricuspid valve is normal in structure. Tricuspid valve regurgitation is not demonstrated. No evidence of tricuspid stenosis. Aortic Valve: The aortic valve has an indeterminant number of cusps. There is moderate calcification of the aortic valve. There is moderate thickening of the aortic valve. Aortic valve regurgitation is mild to moderate. Aortic regurgitation PHT measures 524 msec. Moderate aortic stenosis is present. Aortic valve mean gradient measures 19.0 mmHg. Aortic valve peak gradient measures 36.1 mmHg. Aortic valve area, by VTI measures 1.62 cm. Pulmonic Valve: The pulmonic valve was normal in structure. Pulmonic valve regurgitation is trivial. No evidence of pulmonic  stenosis. Aorta: The ascending aortic dimensions are 4.2cm and when  indexed to BSA and gender, this is in the normal range. The aortic root is normal in size and structure. Venous: The inferior vena cava is normal in size with greater than 50% respiratory variability, suggesting right atrial pressure of 3 mmHg. IAS/Shunts: No atrial level shunt detected by color flow Doppler.  LEFT VENTRICLE PLAX 2D LVIDd:         5.60 cm   Diastology LVIDs:         4.20 cm   LV e' medial:    6.09 cm/s LV PW:         1.10 cm   LV E/e' medial:  15.3 LV IVS:        1.30 cm   LV e' lateral:   8.59 cm/s LVOT diam:     2.50 cm   LV E/e' lateral: 10.9 LV SV:         104 LV SV Index:   46 LVOT Area:     4.91 cm  RIGHT VENTRICLE            IVC RV S prime:     9.57 cm/s  IVC diam: 1.50 cm TAPSE (M-mode): 1.4 cm LEFT ATRIUM              Index        RIGHT ATRIUM           Index LA diam:        3.90 cm  1.74 cm/m   RA Pressure: 3.00 mmHg LA Vol (A2C):   110.0 ml 48.94 ml/m  RA Area:     16.70 cm LA Vol (A4C):   132.0 ml 58.73 ml/m  RA Volume:   40.00 ml  17.80 ml/m LA Biplane Vol: 122.0 ml 54.28 ml/m  AORTIC VALVE AV Area (Vmax):    1.49 cm AV Area (Vmean):   1.49 cm AV Area (VTI):     1.62 cm AV Vmax:           300.33 cm/s AV Vmean:          197.333 cm/s AV VTI:            0.641 m AV Peak Grad:      36.1 mmHg AV Mean Grad:      19.0 mmHg LVOT Vmax:         91.00 cm/s LVOT Vmean:        59.800 cm/s LVOT VTI:          0.211 m LVOT/AV VTI ratio: 0.33 AI PHT:            524 msec  AORTA Ao Asc diam: 4.20 cm MITRAL VALVE                TRICUSPID VALVE MV Area (PHT): 3.65 cm     Estimated RAP:  3.00 mmHg MV Decel Time: 208 msec MV E velocity: 93.30 cm/s   SHUNTS MV A velocity: 108.00 cm/s  Systemic VTI:  0.21 m MV E/A ratio:  0.86         Systemic Diam: 2.50 cm Armanda Magic MD Electronically signed by Armanda Magic MD Signature Date/Time: 02/19/2023/1:05:34 PM    Final       IMPRESSION/PLAN: 72 y.o. gentleman with Stage T2a adenocarcinoma  of the prostate with a Gleason score of 5+4 and a PSA of 7.41.    We discussed the patient's workup and outlined the nature of prostate cancer in this setting. The patient's T stage, Gleason's score, and PSA  put him into the high risk group. Accordingly, he is eligible for a variety of potential treatment options including prostatectomy or LT-ADT in combination with either 8 weeks of external radiation or 5 weeks of external radiation with an upfront brachytherapy boost. We discussed the available radiation techniques, and focused on the details and logistics of delivery.  He is not a candidate for brachytherapy boost due to his history of prior TURP.  Therefore, we discussed and outlined the risks, benefits, short and long-term effects associated with daily external beam radiotherapy and compared and contrasted these with prostatectomy. We discussed the role of SpaceOAR gel in reducing the rectal toxicity associated with radiotherapy. We also detailed the role of ADT in the treatment of high risk prostate cancer and outlined the associated side effects that could be expected with this therapy. He appears to have a good understanding of his disease and our treatment recommendations which are of curative intent.  He was encouraged to ask questions that were answered to his stated satisfaction.  The patient focused most of his questions and interest in robotic-assisted laparoscopic radical prostatectomy.  We discussed some of the potential advantages of surgery including surgical staging, the availability of salvage radiotherapy to the prostatic fossa, and the confidence associated with immediate biochemical response.  We discussed some of the potential proven indications for postoperative radiotherapy including positive margins, extracapsular extension, and seminal vesicle involvement. We also talked about some of the other potential findings leading to a recommendation for radiotherapy including a non-zero  postoperative PSA and positive lymph nodes.    At the end of the conversation the patient would like to proceed with prostatectomy.  We enjoyed meeting with him today, and will look forward to following his progress.  Of course, we are more than happy to continue to participate in his care if clinically indicated in the future.  He knows that he is welcome to call anytime with any questions or concerns related to the radiation treatment options discussed today.  We personally spent 60 minutes in this encounter including chart review, reviewing radiological studies, meeting face-to-face with the patient, entering orders and completing documentation.    Marguarite Arbour, PA-C    Margaretmary Dys, MD  [facility]  Radiation Oncology Direct Dial: [phone]  Fax: [phone] [email or web address]  Skype  [email or web address]   This document serves as a record of services personally performed by Margaretmary Dys, MD and Marcello Fennel, PA-C. It was created on their behalf by Mickie Bail, a trained medical scribe. The creation of this record is based on the scribe's personal observations and the provider's statements to them. This document has been checked and approved by the attending provider.

## 2023-03-13 ENCOUNTER — Encounter: Payer: Self-pay | Admitting: Genetic Counselor

## 2023-03-13 NOTE — Progress Notes (Signed)
REFERRING PROVIDER: Margaretmary Dys, MD  PRIMARY PROVIDER:  Ailene Ravel, MD  PRIMARY REASON FOR VISIT:  1. Malignant neoplasm of prostate   2. Family history of prostate cancer    HISTORY OF PRESENT ILLNESS:   Matthew Schmitt, a 72 y.o. male, was seen for a [facility] cancer genetics consultation at the request of Dr. Kathrynn Running due to a personal and family history of cancer.  Mr. Vecchio presents to clinic today to discuss the possibility of a hereditary predisposition to cancer, to discuss genetic testing, and to further clarify his future cancer risks, as well as potential cancer risks for family members.   Mr. Mcglasson was diagnosed with prostate cancer at age 48 (Gleason score: 9).  CANCER HISTORY:  Oncology History  Malignant neoplasm of prostate  01/31/2023 Cancer Staging   Staging form: Prostate, AJCC 8th Edition - Clinical stage from 01/31/2023: cT2a, PSA: 7.4, Grade Group: 5 - Signed by Marcello Fennel, PA-C on 03/11/2023 Histopathologic type: Adenocarcinoma, NOS Stage prefix: Initial diagnosis Prostate specific antigen (PSA) range: Less than 10 Gleason primary pattern: 5 Gleason secondary pattern: 4 Gleason score: 9 Histologic grading system: 5 grade system Number of biopsy cores examined: 20 Number of biopsy cores positive: 16 Location of positive needle core biopsies: Both sides   03/11/2023 Initial Diagnosis   Malignant neoplasm of prostate    Past Medical History:  Diagnosis Date   Anxiety    Arthritis    everywhere   Arthrosis of right acromioclavicular joint 01/29/2020   Atrial flutter    Onset May 2014 Cardioversion done    BPH associated with nocturia    BPH with obstruction/lower urinary tract symptoms 01/04/2017   CAD (coronary artery disease) 04/02/2013   Cath 2012 moderate mid LAD lesion, otherwise not much disease    Diabetes 01/18/2018   Dilated aortic root    aortic root 44 mm, ascending aorta 46 mm 12/22/21 echo   Encounter for long-term (current)  use of other medications 07/27/2013   Essential hypertension, benign    First degree heart block    Heart murmur    mild-moderate AS and AR 01/11/22   History of bilateral knee replacement 02/07/2017   History of bladder stone    History of kidney stones    History of melanoma excision    June 2016  --- s/p  excision right  leg (per pt localized and no recurrence)   Hypertensive heart disease 04/02/2013   Impingement syndrome of right shoulder 01/29/2020   Impotence of organic origin    Left-sided low back pain with left-sided sciatica 01/23/2019   Long term current use of anticoagulant therapy    Mixed hyperlipidemia    slightly   Multiple renal cysts    bilateral   Nephrolithiasis 05/17/2014   Nonischemic cardiomyopathy    Osteoarthritis    PAF (paroxysmal atrial fibrillation)    followed by cardiology  (takes ASA)   RBBB    RBBB (right bundle branch block with left anterior fascicular block) 04/02/2013   Renal oncocytoma of left kidney 06/18/2013   s/p left robotic assited laparoscopic partial nephrectomy 06/18/13   Right ureteral stone    Rotator cuff syndrome of left shoulder 04/17/2016   Status post total left knee replacement 01/01/2020   Tendinopathy of right biceps tendon 01/29/2020   Tendinopathy of right rotator cuff 01/29/2020   Type 2 diabetes mellitus treated with insulin    endocrinologist-  dr Everardo All   Wears glasses     Past Surgical  History:  Procedure Laterality Date   A-FLUTTER ABLATION N/A 10/27/2021   Procedure: A-FLUTTER ABLATION;  Surgeon: Lanier Prude, MD;  Location: [facility] INVASIVE CV LAB;  Service: Cardiovascular;  Laterality: N/A;   CARDIAC CATHETERIZATION  08-17-2011  DR Karleen Hampshire TILLEY   POSSIBLE MODERATE LAD STENOSIS JUST AFTER THE BIFURCATION OF LARGE DIAGONAL BRANCH/ LVEF  40-45%/ MILD GLOBAL HYPODINESIS (CATH DONE FOR ABNORMAL STRESS TEST OF FIXED LATERAL WALL DEFECT & BIFASCICULAR BLAOCK)   CARDIOVERSION N/A 04/02/2013   Procedure:  CARDIOVERSION;  Surgeon: Othella Boyer, MD;  Location[facility] OR;  Service: Cardiovascular;  Laterality: N/A;   CARDIOVERSION N/A 12/27/2020   Procedure: CARDIOVERSION;  Surgeon: Chilton Si, MD;  Location: [facility] ENDOSCOPY;  Service: Cardiovascular;  Laterality: N/A;   CYSTOSCOPY WITH LITHOLAPAXY N/A 05/18/2015   Procedure: CYSTOSCOPY WITH LITHOLAPAXY;  Surgeon: Barron Alvine, MD;  Location: [facility];  Service: Urology;  Laterality: N/A;   CYSTOSCOPY WITH LITHOLAPAXY N/A 01/04/2017   Procedure: CYSTOSCOPY WITH LITHOLAPAXY, Holmium Laser;  Surgeon: Crist Fat, MD;  Location: [facility] ORS;  Service: Urology;  Laterality: N/A;   CYSTOSCOPY WITH RETROGRADE PYELOGRAM, URETEROSCOPY AND STENT PLACEMENT  11/03/2012   Procedure: CYSTOSCOPY WITH RETROGRADE PYELOGRAM, URETEROSCOPY AND STENT PLACEMENT;  Surgeon: Valetta Fuller, MD;  Location: [facility];  Service: Urology;  Laterality: Left;  Cystoscopy/Left Retrograde/Ureteroscopy/Holmium Laser Litho/Double J Stent   CYSTOSCOPY WITH URETEROSCOPY, STONE BASKETRY AND STENT PLACEMENT Right 05/15/2019   Procedure: CYSTOSCOPY WITH URETEROSCOPY, LASER LITHOTRIPSY STONE BASKETRY AND STENT PLACEMENT, RIGHT RETROGRADE;  Surgeon: Crist Fat, MD;  Location: [facility];  Service: Urology;  Laterality: Right;   HOLMIUM LASER APPLICATION  11/03/2012   Procedure: HOLMIUM LASER APPLICATION;  Surgeon: Valetta Fuller, MD;  Location: [facility];  Service: Urology;  Laterality: Left;   HOLMIUM LASER APPLICATION N/A 05/18/2015   Procedure: HOLMIUM LASER APPLICATION;  Surgeon: Barron Alvine, MD;  Location: [facility];  Service: Urology;  Laterality: N/A;   KNEE SURGERY Bilateral 1978  &  1974   MELANOMA EXCISION Bilateral 04/2015   20th-- left leg //  1st-- right leg w/ skin graft from right thigh (no lymph node bx)   NEPHROLITHOTOMY Right 05/17/2014   Procedure: NEPHROLITHOTOMY  PERCUTANEOUS;  Surgeon: Valetta Fuller, MD;  Location: [facility];  Service: Urology;  Laterality: Right;   PILONIDAL CYST EXCISION     PROSTATE BIOPSY     ROBOTIC ASSITED PARTIAL NEPHRECTOMY Left 06/18/2013   Procedure: ROBOTIC ASSISTED PARTIAL NEPHRECTOMY;  Surgeon: [name], MD;  Location: [facility];  Service: Urology;  Laterality: Left;   ROTATOR CUFF REPAIR Left 07/2016   SHOULDER ARTHROSCOPY WITH ROTATOR CUFF REPAIR AND SUBACROMIAL DECOMPRESSION Right 05/27/2020   Procedure: RIGHT SHOULDER ARTHROSCOPY WITH EXTENSIVE DEBRIDEMENT, DISTAL CLAVICLE EXCISION AND SUBACROMIAL DECOMPRESSION;  Surgeon: Tarry Kos, MD;  Location: [facility];  Service: Orthopedics;  Laterality: Right;   TEE WITH CARDIOVERSION  12/27/2020   TEE WITHOUT CARDIOVERSION N/A 12/27/2020   Procedure: TRANSESOPHAGEAL ECHOCARDIOGRAM (TEE);  Surgeon: Chilton Si, MD;  Location: [facility] ENDOSCOPY;  Service: Cardiovascular;  Laterality: N/A;   TONSILLECTOMY  81  (age 54)   TOTAL KNEE ARTHROPLASTY Left 02/07/2017   Procedure: LEFT TOTAL KNEE ARTHROPLASTY;  Surgeon: Tarry Kos, MD;  Location: [facility] OR;  Service: Orthopedics;  Laterality: Left;   TOTAL KNEE ARTHROPLASTY Right 04/25/2017   Procedure: RIGHT TOTAL KNEE ARTHROPLASTY;  Surgeon: Tarry Kos, MD;  Location: [facility] OR;  Service: Orthopedics;  Laterality: Right;   TOTAL KNEE REVISION Left 07/13/2022   Procedure: LEFT TOTAL KNEE REVISION, TIBIAL COMPONENT;  Surgeon: Tarry Kos, MD;  Location: [facility] OR;  Service: Orthopedics;  Laterality: Left;   TRANSURETHRAL RESECTION OF PROSTATE N/A 01/04/2017   Procedure: TRANSURETHRAL RESECTION OF THE PROSTATE (TURP);  Surgeon: Crist Fat, MD;  Location: [facility]S;  Service: Urology;  Laterality: N/A;    Social History   Socioeconomic History   Marital status: Married    Spouse name: Not on file   Number of children: 2   Years of education: 16   Highest education level: Not on file  Occupational History    Occupation: Emergency planning/management officer, Mining engineer  Tobacco Use   Smoking status: Former    Packs/day: 1.00    Years: 6.00    Additional pack years: 0.00    Total pack years: 6.00    Types: Cigarettes    Quit date: 10/31/1969    Years since quitting: 53.4   Smokeless tobacco: Never  Vaping Use   Vaping Use: Never used  Substance and Sexual Activity   Alcohol use: Yes    Alcohol/week: 0.0 standard drinks of alcohol    Comment: OCCASIONAL   Drug use: No   Sexual activity: Never  Other Topics Concern   Not on file  Social History Narrative   Regular exercise-no   Social Determinants of Health   Financial Resource Strain: Not on file  Food Insecurity: Not on file  Transportation Needs: Not on file  Physical Activity: Not on file  Stress: Not on file  Social Connections: Not on file     FAMILY HISTORY:  We obtained a detailed, 4-generation family history.  Significant diagnoses are listed below: Family History  Problem Relation Age of Onset   Arthritis Mother    Hypertension Mother    Arthritis Father    Hypertension Father    Diabetes Father    Heart attack Brother 20   Diabetes Brother    Cancer Maternal Uncle        unknown type   Prostate cancer Paternal Uncle 8 - 57       metastatic     Mr. Akram has 8 paternal uncles and one was diagnosed with prostate cancer in his 34s, he died due to metastatic prostate cancer. Mr. Antolin has 4 maternal uncles and reports they all had a history of cancer (unknown types). He is unaware of previous family history of genetic testing for hereditary cancer risks. There is no reported Ashkenazi Jewish ancestry.   GENETIC COUNSELING ASSESSMENT: Mr. Eberlein is a 72 y.o. male with a personal and family history of cancer which is somewhat suggestive of a hereditary predisposition to cancer given his high risk prostate cancer. We, therefore, discussed and recommended the following at today's visit.   DISCUSSION: We discussed that 5 - 10%  of cancer is hereditary, with most cases of prostate cancer associated with BRCA1/2.  There are other genes that can be associated with hereditary prostate cancer syndromes.  We discussed that testing is beneficial for several reasons including knowing how to follow individuals after completing their treatment, identifying whether potential treatment options would be beneficial, and understanding if other family members could be at risk for cancer and allowing them to undergo genetic testing.   We reviewed the characteristics, features and inheritance patterns of hereditary cancer syndromes. We also discussed genetic testing, including the appropriate family members to test, the process of testing,  insurance coverage and turn-around-time for results. We discussed the implications of a negative, positive, carrier and/or variant of uncertain significant result. We recommended Mr. Holdren pursue genetic testing for a panel that includes genes associated with prostate cancer and melanoma.   Mr. Yusuf elected to have [facility] Custom Panel. The Custom Hereditary Cancers Panel offered by [facility] includes sequencing and/or deletion duplication testing of the following 45 genes: APC, ATM, AXIN2, BAP1, BARD1, BMPR1A, BRCA1, BRCA2, BRIP1, CDH1, CDK4, CDKN2A (p14ARF and p16INK4a only), CHEK2, CTNNA1, EPCAM (Deletion/duplication testing only), FH, GREM1 (promoter region duplication testing only), HOXB13, KIT, MBD4, MEN1, MITF, MLH1, MSH2, MSH3, MSH6, MUTYH, NF1, NHTL1, PALB2, PDGFRA, PMS2, POLD1, POLE, POT1, PTEN, RAD51C, RAD51D, SMAD4, SMARCA4. STK11, TP53, TSC1, TSC2, and VHL.  Based on Mr. Whittenburg personal and family history of cancer, he meets medical criteria for genetic testing. Despite that he meets criteria, he may still have an out of pocket cost. We discussed that if his out of pocket cost for testing is over $100, the laboratory will call and confirm whether he wants to proceed with testing.  If the out of  pocket cost of testing is less than $100 he will be billed by the genetic testing laboratory.   PLAN: After considering the risks, benefits, and limitations, Mr. Menter provided informed consent to pursue genetic testing and the blood sample was sent to [facility] for analysis of the Custom Panel. Results should be available within approximately 2-3 weeks' time, at which point they will be disclosed by telephone to Mr. Sobotka, as will any additional recommendations warranted by these results. Mr. Koudelka will receive a summary of his genetic counseling visit and a copy of his results once available. This information will also be available in Epic.   Mr. Mcaulay questions were answered to his satisfaction today. Our contact information was provided should additional questions or concerns arise. Thank you for the referral and allowing [place] to share in the care of your patient.   Lalla Brothers, MS, [facility] Genetic Counselor [email or web address] (P) [phone]  The patient was seen for a total of 20 minutes in face-to-face genetic counseling. The patient brought his son. Drs. Pamelia Hoit and/or Mosetta Putt were available to discuss this case as needed.   _______________________________________________________________________ For Office Staff:  Number of people involved in session: 2 Was an Intern/ student involved with case: no

## 2023-03-15 ENCOUNTER — Telehealth: Payer: Self-pay

## 2023-03-15 ENCOUNTER — Other Ambulatory Visit: Payer: Self-pay | Admitting: Urology

## 2023-03-15 ENCOUNTER — Telehealth: Payer: Self-pay | Admitting: Cardiology

## 2023-03-15 NOTE — Telephone Encounter (Signed)
   Name: Matthew Schmitt  DOB: 1951-05-12  MRN: [id number]  Primary Cardiologist: Donato Schultz, MD   Preoperative team, please contact this patient and set up a phone call appointment for further preoperative risk assessment. Please obtain consent and complete medication review. Thank you for your help.  I confirm that guidance regarding antiplatelet and oral anticoagulation therapy has been completed and, if necessary, noted below (none requested).    Joylene Grapes, NP 03/15/2023, 4:11 PM [facility]

## 2023-03-15 NOTE — Telephone Encounter (Signed)
  Patient Consent for Virtual Visit         Matthew Schmitt has provided verbal consent on 03/15/2023 for a virtual visit (video or telephone).   CONSENT FOR VIRTUAL VISIT FOR:  Matthew Schmitt  By participating in this virtual visit I agree to the following:  I hereby voluntarily request, consent and authorize [facility] and its employed or contracted physicians, physician assistants, nurse practitioners or other licensed health care professionals (the Practitioner), to provide me with telemedicine health care services (the "Services") as deemed necessary by the treating Practitioner. I acknowledge and consent to receive the Services by the Practitioner via telemedicine. I understand that the telemedicine visit will involve communicating with the Practitioner through live audiovisual communication technology and the disclosure of certain medical information by electronic transmission. I acknowledge that I have been given the opportunity to request an in-person assessment or other available alternative prior to the telemedicine visit and am voluntarily participating in the telemedicine visit.  I understand that I have the right to withhold or withdraw my consent to the use of telemedicine in the course of my care at any time, without affecting my right to future care or treatment, and that the Practitioner or I may terminate the telemedicine visit at any time. I understand that I have the right to inspect all information obtained and/or recorded in the course of the telemedicine visit and may receive copies of available information for a reasonable fee.  I understand that some of the potential risks of receiving the Services via telemedicine include:  Delay or interruption in medical evaluation due to technological equipment failure or disruption; Information transmitted may not be sufficient (e.g. poor resolution of images) to allow for appropriate medical decision making by the  Practitioner; and/or  In rare instances, security protocols could fail, causing a breach of personal health information.  Furthermore, I acknowledge that it is my responsibility to provide information about my medical history, conditions and care that is complete and accurate to the best of my ability. I acknowledge that Practitioner's advice, recommendations, and/or decision may be based on factors not within their control, such as incomplete or inaccurate data provided by me or distortions of diagnostic images or specimens that may result from electronic transmissions. I understand that the practice of medicine is not an exact science and that Practitioner makes no warranties or guarantees regarding treatment outcomes. I acknowledge that a copy of this consent can be made available to me via my patient portal [facility] MyChart), or I can request a printed copy by calling the office of [facility].    I understand that my insurance will be billed for this visit.   I have read or had this consent read to me. I understand the contents of this consent, which adequately explains the benefits and risks of the Services being provided via telemedicine.  I have been provided ample opportunity to ask questions regarding this consent and the Services and have had my questions answered to my satisfaction. I give my informed consent for the services to be provided through the use of telemedicine in my medical care

## 2023-03-15 NOTE — Telephone Encounter (Signed)
   Pre-operative Risk Assessment    Patient Name: Matthew Schmitt  DOB: January 13, 1951 MRN: [id number]      Request for Surgical Clearance    Procedure:   Robot assisted laparoscopic radical prostatectomy and bilateral pelvic lymphadenectomy  Date of Surgery:  Clearance 04/22/23                                 Surgeon:  Dr. Nancie Neas Surgeon's Group or Practice Name:  [facility] Phone number:  [phone]  Fax number:  [phone]   Type of Clearance Requested:   - Medical    Type of Anesthesia:  General    Additional requests/questions:   Caller stated they will need medical clearance for this patient.  Signed, Annetta Maw   03/15/2023, 4:03 PM

## 2023-03-15 NOTE — Telephone Encounter (Signed)
Patient scheduled for telehealth visit on 03/21/23 for clearance. Med rec and consent done

## 2023-03-19 NOTE — Progress Notes (Unsigned)
Patient presented to the [facility] for his stage T2a adenocarcinoma of the prostate with a Gleason's score of 5+4 and a PSA of 7.41 and decided to proceed with surgery for treatment.   Patient is scheduled for robotic prostatectomy for 6/3.  RN spoke with patient and he is aware of surgery date, PT, and follow up appointments.  No barriers at this time.   Plan of care in progress.

## 2023-03-20 NOTE — Progress Notes (Unsigned)
Virtual Visit via Telephone Note   Because of Matthew Schmitt's co-morbid illnesses, he is at least at moderate risk for complications without adequate follow up.  This format is felt to be most appropriate for this patient at this time.  The patient did not have access to video technology/had technical difficulties with video requiring transitioning to audio format only (telephone).  All issues noted in this document were discussed and addressed.  No physical exam could be performed with this format.  Please refer to the patient's chart for his consent to telehealth for [facility]  Evaluation Performed:  Preoperative cardiovascular risk assessment _____________   Date:  03/20/2023   Patient ID:  Matthew Schmitt, DOB 1951-09-23, MRN [id number] Patient Location:  Home Provider location:   Office  Primary Care Provider:  Ailene Ravel, MD Primary Cardiologist:  Donato Schultz, MD  Chief Complaint / Patient Profile   72 y.o. y/o male with a h/o coronary artery disease, chronic systolic CHF, RBBB who is pending robotic assisted laparoscopic radical prostatectomy and bilateral pelvic lymphadenectomy and presents today for telephonic preoperative cardiovascular risk assessment.  History of Present Illness    Matthew Schmitt is a 72 y.o. male who presents via audio/video conferencing for a telehealth visit today.  Pt was last seen in cardiology clinic on 01/22/2023 by Dr. Anne Fu.  At that time Matthew Schmitt was doing well .  The patient is now pending procedure as outlined above. Since his last visit, he remains stable from a cardiac standpoint.  Today he denies chest pain, shortness of breath, lower extremity edema, palpitations, melena, hematuria, hemoptysis, diaphoresis, weakness, presyncope, syncope, orthopnea, and PND.   Past Medical History    Past Medical History:  Diagnosis Date   Anxiety    Arthritis    everywhere   Arthrosis of right acromioclavicular joint 01/29/2020    Atrial flutter [facility])    Onset May 2014 Cardioversion done    BPH associated with nocturia    BPH with obstruction/lower urinary tract symptoms 01/04/2017   CAD (coronary artery disease) 04/02/2013   Cath 2012 moderate mid LAD lesion, otherwise not much disease    Diabetes (HCC) 01/18/2018   Dilated aortic root (HCC)    aortic root 44 mm, ascending aorta 46 mm 12/22/21 echo   Encounter for long-term (current) use of other medications 07/27/2013   Essential hypertension, benign    First degree heart block    Heart murmur    mild-moderate AS and AR 01/11/22   History of bilateral knee replacement 02/07/2017   History of bladder stone    History of kidney stones    History of melanoma excision    June 2016  --- s/p  excision right  leg (per pt localized and no recurrence)   Hypertensive heart disease 04/02/2013   Impingement syndrome of right shoulder 01/29/2020   Impotence of organic origin    Left-sided low back pain with left-sided sciatica 01/23/2019   Long term current use of anticoagulant therapy    Mixed hyperlipidemia    slightly   Multiple renal cysts    bilateral   Nephrolithiasis 05/17/2014   Nonischemic cardiomyopathy (HCC)    Osteoarthritis    PAF (paroxysmal atrial fibrillation) (HCC)    followed by cardiology  (takes ASA)   RBBB    RBBB (right bundle branch block with left anterior fascicular block) 04/02/2013   Renal oncocytoma of left kidney 06/18/2013   s/p left robotic assited laparoscopic partial  nephrectomy 06/18/13   Right ureteral stone    Rotator cuff syndrome of left shoulder 04/17/2016   Status post total left knee replacement 01/01/2020   Tendinopathy of right biceps tendon 01/29/2020   Tendinopathy of right rotator cuff 01/29/2020   Type 2 diabetes mellitus treated with insulin [facility])    endocrinologist-  dr Everardo All   Wears glasses    Past Surgical History:  Procedure Laterality Date   A-FLUTTER ABLATION N/A 10/27/2021   Procedure: A-FLUTTER  ABLATION;  Surgeon: Lanier Prude, MD;  Location: [facility] INVASIVE CV LAB;  Service: Cardiovascular;  Laterality: N/A;   CARDIAC CATHETERIZATION  08-17-2011  DR Karleen Hampshire TILLEY   POSSIBLE MODERATE LAD STENOSIS JUST AFTER THE BIFURCATION OF LARGE DIAGONAL BRANCH/ LVEF  40-45%/ MILD GLOBAL HYPODINESIS (CATH DONE FOR ABNORMAL STRESS TEST OF FIXED LATERAL WALL DEFECT & BIFASCICULAR BLAOCK)   CARDIOVERSION N/A 04/02/2013   Procedure: CARDIOVERSION;  Surgeon: Othella Boyer, MD;  Location: [facility] OR;  Service: Cardiovascular;  Laterality: N/A;   CARDIOVERSION N/A 12/27/2020   Procedure: CARDIOVERSION;  Surgeon: Chilton Si, MD;  Location: [facility] ENDOSCOPY;  Service: Cardiovascular;  Laterality: N/A;   CYSTOSCOPY WITH LITHOLAPAXY N/A 05/18/2015   Procedure: CYSTOSCOPY WITH LITHOLAPAXY;  Surgeon: Barron Alvine, MD;  Location: [facility];  Service: Urology;  Laterality: N/A;   CYSTOSCOPY WITH LITHOLAPAXY N/A 01/04/2017   Procedure: CYSTOSCOPY WITH LITHOLAPAXY, Holmium Laser;  Surgeon: Crist Fat, MD;  Location: [facility] ORS;  Service: Urology;  Laterality: N/A;   CYSTOSCOPY WITH RETROGRADE PYELOGRAM, URETEROSCOPY AND STENT PLACEMENT  11/03/2012   Procedure: CYSTOSCOPY WITH RETROGRADE PYELOGRAM, URETEROSCOPY AND STENT PLACEMENT;  Surgeon: Valetta Fuller, MD;  Location: [facility];  Service: Urology;  Laterality: Left;  Cystoscopy/Left Retrograde/Ureteroscopy/Holmium Laser Litho/Double J Stent   CYSTOSCOPY WITH URETEROSCOPY, STONE BASKETRY AND STENT PLACEMENT Right 05/15/2019   Procedure: CYSTOSCOPY WITH URETEROSCOPY, LASER LITHOTRIPSY STONE BASKETRY AND STENT PLACEMENT, RIGHT RETROGRADE;  Surgeon: Crist Fat, MD;  Location: [facility];  Service: Urology;  Laterality: Right;   HOLMIUM LASER APPLICATION  11/03/2012   Procedure: HOLMIUM LASER APPLICATION;  Surgeon: Valetta Fuller, MD;  Location[facility];  Service: Urology;  Laterality:  Left;   HOLMIUM LASER APPLICATION N/A 05/18/2015   Procedure: HOLMIUM LASER APPLICATION;  Surgeon: Barron Alvine, MD;  Location: [facility];  Service: Urology;  Laterality: N/A;   KNEE SURGERY Bilateral 1978  &  1974   MELANOMA EXCISION Bilateral 04/2015   20th-- left leg //  1st-- right leg w/ skin graft from right thigh (no lymph node bx)   NEPHROLITHOTOMY Right 05/17/2014   Procedure: NEPHROLITHOTOMY PERCUTANEOUS;  Surgeon: Valetta Fuller, MD;  Location: [facility];  Service: Urology;  Laterality: Right;   PILONIDAL CYST EXCISION     PROSTATE BIOPSY     ROBOTIC ASSITED PARTIAL NEPHRECTOMY Left 06/18/2013   Procedure: ROBOTIC ASSISTED PARTIAL NEPHRECTOMY;  Surgeon: [name], MD;  Location: [facility];  Service: Urology;  Laterality: Left;   ROTATOR CUFF REPAIR Left 07/2016   SHOULDER ARTHROSCOPY WITH ROTATOR CUFF REPAIR AND SUBACROMIAL DECOMPRESSION Right 05/27/2020   Procedure: RIGHT SHOULDER ARTHROSCOPY WITH EXTENSIVE DEBRIDEMENT, DISTAL CLAVICLE EXCISION AND SUBACROMIAL DECOMPRESSION;  Surgeon: Tarry Kos, MD;  Location: [facility];  Service: Orthopedics;  Laterality: Right;   TEE WITH CARDIOVERSION  12/27/2020   TEE WITHOUT CARDIOVERSION N/A 12/27/2020   Procedure: TRANSESOPHAGEAL ECHOCARDIOGRAM (TEE);  Surgeon: Chilton Si, MD;  Location: [facility] ENDOSCOPY;  Service: Cardiovascular;  Laterality: N/A;   TONSILLECTOMY  71  (age 85)   TOTAL KNEE ARTHROPLASTY Left 02/07/2017   Procedure: LEFT TOTAL KNEE ARTHROPLASTY;  Surgeon: Tarry Kos, MD;  Location: [facility] OR;  Service: Orthopedics;  Laterality: Left;   TOTAL KNEE ARTHROPLASTY Right 04/25/2017   Procedure: RIGHT TOTAL KNEE ARTHROPLASTY;  Surgeon: Tarry Kos, MD;  Location: [facility] OR;  Service: Orthopedics;  Laterality: Right;   TOTAL KNEE REVISION Left 07/13/2022   Procedure: LEFT TOTAL KNEE REVISION, TIBIAL COMPONENT;  Surgeon: Tarry Kos, MD;  Location: [facility] OR;  Service: Orthopedics;  Laterality: Left;    TRANSURETHRAL RESECTION OF PROSTATE N/A 01/04/2017   Procedure: TRANSURETHRAL RESECTION OF THE PROSTATE (TURP);  Surgeon: Crist Fat, MD;  Location: [facility];  Service: Urology;  Laterality: N/A;    Allergies  Allergies  Allergen Reactions   Actos [Pioglitazone] Swelling and Cough    SWELLING REACTION UNSPECIFIED  EDEMA    Home Medications    Prior to Admission medications   Medication Sig Start Date End Date Taking? Authorizing Provider  dapagliflozin propanediol (FARXIGA) 10 MG TABS tablet Take 1 tablet (10 mg total) by mouth daily before breakfast. 01/30/23   Carlus Pavlov, MD  insulin degludec (TRESIBA FLEXTOUCH) 200 UNIT/ML FlexTouch Pen Inject 90 Units into the skin daily. 11/15/22   Carlus Pavlov, MD  Insulin Pen Needle (PEN NEEDLES) 30G X 8 MM MISC 1 each by Does not apply route daily. E11.9 07/07/20   Romero Belling, MD  Lancets [facility] DELICA PLUS [place]) MISC 1 each by Other route 2 (two) times daily. E11.9 07/07/20   Romero Belling, MD  methocarbamol (ROBAXIN) 750 MG tablet Take 1 tablet (750 mg total) by mouth 2 (two) times daily as needed for muscle spasms. 07/17/22   Cristie Hem, PA-C  metoprolol succinate (TOPROL-XL) 50 MG 24 hr tablet TAKE 1 TABLET DAILY, TAKE WITH OR IMMEDIATELY FOLLOWING A MEAL 03/30/22   Jake Bathe, MD  nitroGLYCERIN (NITROSTAT) 0.4 MG SL tablet Place 0.4 mg under the tongue every 5 (five) minutes x 3 doses as needed for chest pain.    [provider]  [facility] ULTRA test strip USE 1 STRIP TWICE A DAY 03/30/22   Shamleffer, Konrad Dolores, MD  rosuvastatin (CRESTOR) 20 MG tablet TAKE 1 TABLET DAILY 02/09/22   Jake Bathe, MD  sacubitril-valsartan (ENTRESTO) 97-103 MG Take 1 tablet by mouth 2 (two) times daily. 09/07/22   Sharlene Dory, PA-C  Semaglutide,0.25 or 0.5MG /DOS, 2 MG/3ML SOPN Inject 0.5 mg into the skin once a week. 11/15/22   Carlus Pavlov, MD  spironolactone (ALDACTONE) 25 MG tablet TAKE ONE-HALF (1/2)  TABLET DAILY 02/09/22   Jake Bathe, MD  trolamine salicylate (ASPERCREME) 10 % cream Apply 1 application  topically 3 (three) times daily as needed for muscle pain.    [provider]    Physical Exam    Vital Signs:  Matthew Schmitt does not have vital signs available for review today.  Given telephonic nature of communication, physical exam is limited. AAOx3. NAD. Normal affect.  Speech and respirations are unlabored.  Accessory Clinical Findings    None  Assessment & Plan    1.  Preoperative Cardiovascular Risk Assessment: Robotic assisted laparoscopic radical prostatectomy and bilateral pelvic lymphadenectomy, [facility], Dr. Laymond Purser, fax #[phone]      Primary Cardiologist: Donato Schultz, MD  Chart reviewed as part of pre-operative protocol coverage. Given past medical history and time since  last visit, based on ACC/AHA guidelines, Matthew Schmitt would be at acceptable risk for the planned procedure without further cardiovascular testing.   Class IV risk, 11% risk of major cardiac event.  He is able to complete greater than 4 METS of physical activity.  Patient was advised that if he develops new symptoms prior to surgery to contact our office to arrange a follow-up appointment.  He verbalized understanding.  I will route this recommendation to the requesting party via Epic fax function and remove from pre-op pool.     Time:   Today, I have spent 5 minutes with the patient with telehealth technology discussing medical history, symptoms, and management plan.  Prior to his phone evaluation I spent greater than 10 minutes reviewing his past medical history and cardiac medications.   Ronney Asters, NP  03/20/2023, 2:18 PM

## 2023-03-21 ENCOUNTER — Ambulatory Visit: Payer: BC Managed Care – PPO | Attending: Internal Medicine

## 2023-03-21 DIAGNOSIS — Z01810 Encounter for preprocedural cardiovascular examination: Secondary | ICD-10-CM

## 2023-03-22 ENCOUNTER — Encounter: Payer: Self-pay | Admitting: Genetic Counselor

## 2023-03-22 ENCOUNTER — Telehealth: Payer: Self-pay | Admitting: Genetic Counselor

## 2023-03-22 DIAGNOSIS — Z1379 Encounter for other screening for genetic and chromosomal anomalies: Secondary | ICD-10-CM | POA: Insufficient documentation

## 2023-03-22 NOTE — Telephone Encounter (Signed)
I contacted Mr. Brossman to discuss his genetic testing results. No pathogenic variants were identified in the 45 genes analyzed. Detailed clinic note to follow.  The test report has been scanned into EPIC and is located under the Molecular Pathology section of the Results Review tab.  A portion of the result report is included below for reference.   Lalla Brothers, MS, [facility] Genetic Counselor [email or web address] (P) [phone]

## 2023-03-25 ENCOUNTER — Encounter: Payer: Self-pay | Admitting: Genetic Counselor

## 2023-03-25 ENCOUNTER — Ambulatory Visit: Payer: Self-pay | Admitting: Genetic Counselor

## 2023-03-25 DIAGNOSIS — Z1379 Encounter for other screening for genetic and chromosomal anomalies: Secondary | ICD-10-CM

## 2023-03-25 NOTE — Progress Notes (Signed)
HPI:   Matthew Schmitt was previously seen in the [place] [facility] clinic due to a personal and family history of cancer and concerns regarding a hereditary predisposition to cancer. Please refer to our prior cancer genetics clinic note for more information regarding our discussion, assessment and recommendations, at the time. Matthew Schmitt recent genetic test results were disclosed to him, as were recommendations warranted by these results. These results and recommendations are discussed in more detail below.  CANCER HISTORY:  Oncology History  Malignant neoplasm of prostate (HCC)  01/31/2023 Cancer Staging   Staging form: Prostate, AJCC 8th Edition - Clinical stage from 01/31/2023: cT2a, PSA: 7.4, Grade Group: 5 - Signed by Marcello Fennel, PA-C on 03/11/2023 Histopathologic type: Adenocarcinoma, NOS Stage prefix: Initial diagnosis Prostate specific antigen (PSA) range: Less than 10 Gleason primary pattern: 5 Gleason secondary pattern: 4 Gleason score: 9 Histologic grading system: 5 grade system Number of biopsy cores examined: 20 Number of biopsy cores positive: 16 Location of positive needle core biopsies: Both sides   03/11/2023 Initial Diagnosis   Malignant neoplasm of prostate     FAMILY HISTORY:  We obtained a detailed, 4-generation family history.  Significant diagnoses are listed below:      Family History  Problem Relation Age of Onset   Arthritis Mother     Hypertension Mother     Arthritis Father     Hypertension Father     Diabetes Father     Heart attack Brother 26   Diabetes Brother     Cancer Maternal Uncle          unknown type   Prostate cancer Paternal Uncle 72 - 28        metastatic       Matthew Schmitt has 8 paternal uncles and one was diagnosed with prostate cancer in his 28s, he died due to metastatic prostate cancer. Matthew Schmitt has 4 maternal uncles and reports they all had a history of cancer (unknown types). He is unaware of previous family history  of genetic testing for hereditary cancer risks. There is no reported Ashkenazi Jewish ancestry.   GENETIC TEST RESULTS:  The [facility] Custom Panel found no pathogenic mutations.   The Custom Hereditary Cancers Panel offered by [facility] includes sequencing and/or deletion duplication testing of the following 45 genes: APC, ATM, AXIN2, BAP1, BARD1, BMPR1A, BRCA1, BRCA2, BRIP1, CDH1, CDK4, CDKN2A (p14ARF and p16INK4a only), CHEK2, CTNNA1, EPCAM (Deletion/duplication testing only), FH, GREM1 (promoter region duplication testing only), HOXB13, KIT, MBD4, MEN1, MITF, MLH1, MSH2, MSH3, MSH6, MUTYH, NF1, NHTL1, PALB2, PDGFRA, PMS2, POLD1, POLE, POT1, PTEN, RAD51C, RAD51D, SMAD4, SMARCA4. STK11, TP53, TSC1, TSC2, and VHL.   The test report has been scanned into EPIC and is located under the Molecular Pathology section of the Results Review tab.  A portion of the result report is included below for reference. Genetic testing reported out on 03/19/2023.     Even though a pathogenic variant was not identified, possible explanations for the cancer in the family may include: There may be no hereditary risk for cancer in the family. The cancers in Matthew Schmitt and/or his family may be due to other genetic or environmental factors. There may be a gene mutation in one of these genes that current testing methods cannot detect, but that chance is small. There could be another gene that has not yet been discovered, or that we have not yet tested, that is responsible for the cancer diagnoses in the family.   Therefore,  it is important to remain in touch with cancer genetics in the future so that we can continue to offer Matthew Schmitt the most up to date genetic testing.   ADDITIONAL GENETIC TESTING:  We discussed with Matthew Schmitt that his genetic testing was fairly extensive.  If there are genes identified to increase cancer risk that can be analyzed in the future, we would be happy to discuss and coordinate this testing at  that time.    CANCER SCREENING RECOMMENDATIONS:  Matthew Schmitt test result is considered negative (normal).  This means that we have not identified a hereditary cause for his personal and family history of cancer at this time. Most cancers happen by chance and this negative test suggests that his cancer may fall into this category.    An individual's cancer risk and medical management are not determined by genetic test results alone. Overall cancer risk assessment incorporates additional factors, including personal medical history, family history, and any available genetic information that may result in a personalized plan for cancer prevention and surveillance. Therefore, it is recommended he continue to follow the cancer management and screening guidelines provided by his oncology and primary healthcare provider.  RECOMMENDATIONS FOR FAMILY MEMBERS:   Since he did not inherit a mutation in a cancer predisposition gene included on this panel, his children could not have inherited a mutation from him in one of these genes.  FOLLOW-UP:  Cancer genetics is a rapidly advancing field and it is possible that new genetic tests will be appropriate for him and/or his family members in the future. We encouraged him to remain in contact with cancer genetics on an annual basis so we can update his personal and family histories and let him know of advances in cancer genetics that may benefit this family.   Our contact number was provided. Matthew Schmitt questions were answered to his satisfaction, and he knows he is welcome to call us at anytime with additional questions or concerns.   Lalla Brothers, MS, [facility] Genetic Counselor [email or web address] (P) [phone]

## 2023-04-09 NOTE — Patient Instructions (Signed)
DUE TO COVID-19 ONLY TWO VISITORS  (aged 73 and older)  ARE ALLOWED TO COME WITH YOU AND STAY IN THE WAITING ROOM ONLY DURING PRE OP AND PROCEDURE.   **NO VISITORS ARE ALLOWED IN THE SHORT STAY AREA OR RECOVERY ROOM!!**  IF YOU WILL BE ADMITTED INTO THE HOSPITAL YOU ARE ALLOWED ONLY FOUR SUPPORT PEOPLE DURING VISITATION HOURS ONLY (7 AM -8PM)   The support person(s) must pass our screening, gel in and out, and wear a mask at all times, including in the patient's room. Patients must also wear a mask when staff or their support person are in the room. Visitors GUEST BADGE MUST BE WORN VISIBLY  One adult visitor may remain with you overnight and MUST be in the room by 8 P.M.   04/22/23  Your procedure is scheduled on:    Report to [facility] Main Entrance    Report to admitting at : 9:00 AM   Call this number if you have problems the morning of surgery [phone]   Clear liquids starting the day before surgery until : 8:00 AM DAY OF SURGERY  Water Black Coffee (sugar ok, NO MILK/CREAM OR CREAMERS)  Tea (sugar ok, NO MILK/CREAM OR CREAMERS) regular and decaf                             Plain Jell-O (NO RED)                                           Fruit ices (not with fruit pulp, NO RED)                                     Popsicles (NO RED)                                                                  Juice: apple, WHITE grape, WHITE cranberry Sports drinks like Gatorade (NO RED)              FOLLOW BOWEL PREP AND ANY ADDITIONAL PRE OP INSTRUCTIONS YOU RECEIVED FROM YOUR SURGEON'S OFFICE!!!   Oral Hygiene is also important to reduce your risk of infection.                                    Remember - BRUSH YOUR TEETH THE MORNING OF SURGERY WITH YOUR REGULAR TOOTHPASTE  DENTURES WILL BE REMOVED PRIOR TO SURGERY PLEASE DO NOT APPLY "Poly grip" OR ADHESIVES!!!   Do NOT smoke after Midnight   Take these medicines the morning of surgery with A SIP OF WATER:  metoprolol.  How to Manage Your Diabetes Before and After Surgery  Why is it important to control my blood sugar before and after surgery? Improving blood sugar levels before and after surgery helps healing and can limit problems. A way of improving blood sugar control is eating a healthy diet by:  Eating less sugar and carbohydrates  Increasing activity/exercise  Talking with your doctor about reaching your blood sugar goals High blood sugars (greater than 180 mg/dL) can raise your risk of infections and slow your recovery, so you will need to focus on controlling your diabetes during the weeks before surgery. Make sure that the doctor who takes care of your diabetes knows about your planned surgery including the date and location.  How do I manage my blood sugar before surgery? Check your blood sugar at least 4 times a day, starting 2 days before surgery, to make sure that the level is not too high or low. Check your blood sugar the morning of your surgery when you wake up and every 2 hours until you get to the Short Stay unit. If your blood sugar is less than 70 mg/dL, you will need to treat for low blood sugar: Do not take insulin. Treat a low blood sugar (less than 70 mg/dL) with  cup of clear juice (cranberry or apple), 4 glucose tablets, OR glucose gel. Recheck blood sugar in 15 minutes after treatment (to make sure it is greater than 70 mg/dL). If your blood sugar is not greater than 70 mg/dL on recheck, call [phone] for further instructions. Report your blood sugar to the short stay nurse when you get to Short Stay.  If you are admitted to the hospital after surgery: Your blood sugar will be checked by the staff and you will probably be given insulin after surgery (instead of oral diabetes medicines) to make sure you have good blood sugar levels. The goal for blood sugar control after surgery is 80-180 mg/dL.   WHAT DO I DO ABOUT MY DIABETES MEDICATION?  Hold farxiga 72  hours ( 3 days) before surgery. Last dose: 04/18/23  THE DAY BEFORE SURGERY, take [place] as usual in the morning.     THE MORNING OF SURGERY, take ONLY half of the tresiba dose.  DO NOT TAKE ANY ORAL DIABETIC MEDICATIONS DAY OF YOUR SURGERY  DO NOT TAKE THE FOLLOWING 7 DAYS PRIOR TO SURGERY: Ozempic, Wegovy, Rybelsus (Semaglutide), Byetta (exenatide), Bydureon (exenatide ER), Victoza, Saxenda (liraglutide), or Trulicity (dulaglutide) Mounjaro (Tirzepatide) Adlyxin (Lixisenatide), Polyethylene Glycol Loxenatide. Last dose: 04/10/23  Bring CPAP mask and tubing day of surgery.                              You may not have any metal on your body including hair pins, jewelry, and body piercing             Do not wear lotions, powders, perfumes/cologne, or deodorant              Men may shave face and neck.   Do not bring valuables to the hospital.  IS NOT             RESPONSIBLE   FOR VALUABLES.   Contacts, glasses, or bridgework may not be worn into surgery.   Bring small overnight bag day of surgery.   DO NOT BRING YOUR HOME MEDICATIONS TO THE HOSPITAL. PHARMACY WILL DISPENSE MEDICATIONS LISTED ON YOUR MEDICATION LIST TO YOU DURING YOUR ADMISSION IN THE HOSPITAL!    Patients discharged on the day of surgery will not be allowed to drive home.  Someone NEEDS to stay with you for the first 24 hours after anesthesia.   Special Instructions: Bring a copy of your healthcare power of attorney and living will documents  the day of surgery if you haven't scanned them before.              Please read over the following fact sheets you were given: IF YOU HAVE QUESTIONS ABOUT YOUR PRE-OP INSTRUCTIONS PLEASE CALL [phone]    [facility] - Preparing for Surgery Before surgery, you can play an important role.  Because skin is not sterile, your skin needs to be as free of germs as possible.  You can reduce the number of germs on your skin by washing with CHG (chlorahexidine  gluconate) soap before surgery.  CHG is an antiseptic cleaner which kills germs and bonds with the skin to continue killing germs even after washing. Please DO NOT use if you have an allergy to CHG or antibacterial soaps.  If your skin becomes reddened/irritated stop using the CHG and inform your nurse when you arrive at Short Stay. Do not shave (including legs and underarms) for at least 48 hours prior to the first CHG shower.  You may shave your face/neck. Please follow these instructions carefully:  1.  Shower with CHG Soap the night before surgery and the  morning of Surgery.  2.  If you choose to wash your hair, wash your hair first as usual with your  normal  shampoo.  3.  After you shampoo, rinse your hair and body thoroughly to remove the  shampoo.                           4.  Use CHG as you would any other liquid soap.  You can apply chg directly  to the skin and wash                       Gently with a scrungie or clean washcloth.  5.  Apply the CHG Soap to your body ONLY FROM THE NECK DOWN.   Do not use on face/ open                           Wound or open sores. Avoid contact with eyes, ears mouth and genitals (private parts).                       Wash face,  Genitals (private parts) with your normal soap.             6.  Wash thoroughly, paying special attention to the area where your surgery  will be performed.  7.  Thoroughly rinse your body with warm water from the neck down.  8.  DO NOT shower/wash with your normal soap after using and rinsing off  the CHG Soap.                9.  Pat yourself dry with a clean towel.            10.  Wear clean pajamas.            11.  Place clean sheets on your bed the night of your first shower and do not  sleep with pets. Day of Surgery : Do not apply any lotions/deodorants the morning of surgery.  Please wear clean clothes to the hospital/surgery center.  FAILURE TO FOLLOW THESE INSTRUCTIONS MAY RESULT IN THE CANCELLATION OF YOUR  SURGERY PATIENT SIGNATURE_________________________________  NURSE SIGNATURE__________________________________  ________________________________________________________________________ WHAT IS A BLOOD TRANSFUSION? Blood Transfusion Information  A  transfusion is the replacement of blood or some of its parts. Blood is made up of multiple cells which provide different functions. Red blood cells carry oxygen and are used for blood loss replacement. White blood cells fight against infection. Platelets control bleeding. Plasma helps clot blood. Other blood products are available for specialized needs, such as hemophilia or other clotting disorders. BEFORE THE TRANSFUSION  Who gives blood for transfusions?  Healthy volunteers who are fully evaluated to make sure their blood is safe. This is blood bank blood. Transfusion therapy is the safest it has ever been in the practice of medicine. Before blood is taken from a donor, a complete history is taken to make sure that person has no history of diseases nor engages in risky social behavior (examples are intravenous drug use or sexual activity with multiple partners). The donor's travel history is screened to minimize risk of transmitting infections, such as malaria. The donated blood is tested for signs of infectious diseases, such as HIV and hepatitis. The blood is then tested to be sure it is compatible with you in order to minimize the chance of a transfusion reaction. If you or a relative donates blood, this is often done in anticipation of surgery and is not appropriate for emergency situations. It takes many days to process the donated blood. RISKS AND COMPLICATIONS Although transfusion therapy is very safe and saves many lives, the main dangers of transfusion include:  Getting an infectious disease. Developing a transfusion reaction. This is an allergic reaction to something in the blood you were given. Every precaution is taken to prevent  this. The decision to have a blood transfusion has been considered carefully by your caregiver before blood is given. Blood is not given unless the benefits outweigh the risks. AFTER THE TRANSFUSION Right after receiving a blood transfusion, you will usually feel much better and more energetic. This is especially true if your red blood cells have gotten low (anemic). The transfusion raises the level of the red blood cells which carry oxygen, and this usually causes an energy increase. The nurse administering the transfusion will monitor you carefully for complications. HOME CARE INSTRUCTIONS  No special instructions are needed after a transfusion. You may find your energy is better. Speak with your caregiver about any limitations on activity for underlying diseases you may have. SEEK MEDICAL CARE IF:  Your condition is not improving after your transfusion. You develop redness or irritation at the intravenous (IV) site. SEEK IMMEDIATE MEDICAL CARE IF:  Any of the following symptoms occur over the next 12 hours: Shaking chills. You have a temperature by mouth above 102 F (38.9 C), not controlled by medicine. Chest, back, or muscle pain. People around you feel you are not acting correctly or are confused. Shortness of breath or difficulty breathing. Dizziness and fainting. You get a rash or develop hives. You have a decrease in urine output. Your urine turns a dark color or changes to pink, red, or brown. Any of the following symptoms occur over the next 10 days: You have a temperature by mouth above 102 F (38.9 C), not controlled by medicine. Shortness of breath. Weakness after normal activity. The white part of the eye turns yellow (jaundice). You have a decrease in the amount of urine or are urinating less often. Your urine turns a dark color or changes to pink, red, or brown. Document Released: 11/02/2000 Document Revised: 01/28/2012 Document Reviewed: 06/21/2008 [facility] Patient  Information 2014 [place], [place].  _______________________________________________________________________

## 2023-04-10 ENCOUNTER — Encounter (HOSPITAL_COMMUNITY)
Admission: RE | Admit: 2023-04-10 | Discharge: 2023-04-10 | Disposition: A | Discharge status: Home / Self Care | Payer: BC Managed Care – PPO | Source: Ambulatory Visit | Attending: Urology | Admitting: Urology

## 2023-04-10 ENCOUNTER — Encounter (HOSPITAL_COMMUNITY): Payer: Self-pay

## 2023-04-10 ENCOUNTER — Other Ambulatory Visit: Payer: Self-pay

## 2023-04-10 VITALS — BP 150/74 | HR 68 | Temp 98.3°F | Ht 71.0 in | Wt 229.0 lb

## 2023-04-10 DIAGNOSIS — Z794 Long term (current) use of insulin: Secondary | ICD-10-CM | POA: Diagnosis not present

## 2023-04-10 DIAGNOSIS — Z01812 Encounter for preprocedural laboratory examination: Secondary | ICD-10-CM | POA: Diagnosis present

## 2023-04-10 DIAGNOSIS — E119 Type 2 diabetes mellitus without complications: Secondary | ICD-10-CM | POA: Insufficient documentation

## 2023-04-10 HISTORY — DX: Peripheral vascular disease, unspecified: I73.9

## 2023-04-10 LAB — CBC
HCT: 51.3 % (ref 39.0–52.0)
Hemoglobin: 16.4 g/dL (ref 13.0–17.0)
MCH: 27.7 pg (ref 26.0–34.0)
MCHC: 32 g/dL (ref 30.0–36.0)
MCV: 86.5 fL (ref 80.0–100.0)
Platelets: 161 10*3/uL (ref 150–400)
RBC: 5.93 MIL/uL — ABNORMAL HIGH (ref 4.22–5.81)
RDW: 13.9 % (ref 11.5–15.5)
WBC: 5.7 10*3/uL (ref 4.0–10.5)
nRBC: 0 % (ref 0.0–0.2)

## 2023-04-10 LAB — TYPE AND SCREEN
ABO/RH(D): A POS
Antibody Screen: NEGATIVE

## 2023-04-10 LAB — GLUCOSE, CAPILLARY: Glucose-Capillary: 195 mg/dL — ABNORMAL HIGH (ref 70–99)

## 2023-04-10 LAB — HM DIABETES EYE EXAM

## 2023-04-10 LAB — BASIC METABOLIC PANEL
Anion gap: 7 (ref 5–15)
BUN: 18 mg/dL (ref 8–23)
CO2: 26 mmol/L (ref 22–32)
Calcium: 8.8 mg/dL — ABNORMAL LOW (ref 8.9–10.3)
Chloride: 108 mmol/L (ref 98–111)
Creatinine, Ser: 0.95 mg/dL (ref 0.61–1.24)
GFR, Estimated: >60 mL/min (ref >60)
Glucose, Bld: 188 mg/dL — ABNORMAL HIGH (ref 70–99)
Potassium: 4.1 mmol/L (ref 3.5–5.1)
Sodium: 141 mmol/L (ref 135–145)

## 2023-04-10 NOTE — Progress Notes (Signed)
For Short Stay: COVID SWAB appointment date:  Bowel Prep reminder:   For Anesthesia: PCP - Dr. Burnell Blanks Cardiologist - Dr. Donato Schultz. Clearance: Edd Fabian: NP: 03/21/23 Chest x-ray -  EKG - 01/22/23 Stress Test -  ECHO - 02/19/23 Cardiac Cath - 2012. Pacemaker/ICD device last checked: Pacemaker orders received: Device Rep notified:  Spinal Cord Stimulator: N/A  Sleep Study - Yes CPAP - No  Fasting Blood Sugar - 100 - 150's Checks Blood Sugar __1___ times a day Date and result of last Hgb A1c-7.6: 01/30/23  Last dose of GLP1 agonist- Semaglutide: Last dose : 03/27/23 GLP1 instructions: To keep it on hold.  Last dose of SGLT-2 inhibitors- N/A SGLT-2 instructions:   Blood Thinner Instructions: N/A Aspirin Instructions: Last Dose:  Activity level: Can go up a flight of stairs and activities of daily living without stopping and without chest pain and/or shortness of breath   Able to exercise without chest pain and/or shortness of breath  Anesthesia review: Hx: CHF,DVT,DIA,HTN,Rt. BBB,CAD,Aflutter.  Patient denies shortness of breath, fever, cough and chest pain at PAT appointment   Patient verbalized understanding of instructions that were given to them at the PAT appointment. Patient was also instructed that they will need to review over the PAT instructions again at home before surgery.

## 2023-04-11 ENCOUNTER — Encounter: Payer: Self-pay | Admitting: Internal Medicine

## 2023-04-12 LAB — HEMOGLOBIN A1C
Hgb A1c MFr Bld: 7.6 % — ABNORMAL HIGH (ref 4.8–5.6)
Mean Plasma Glucose: 171 mg/dL

## 2023-04-16 NOTE — Anesthesia Preprocedure Evaluation (Addendum)
Anesthesia Evaluation  Patient identified by MRN, date of birth, ID band Patient awake    Reviewed: Allergy & Precautions, NPO status , Patient's Chart, lab work & pertinent test results  History of Anesthesia Complications Negative for: history of anesthetic complications  Airway Mallampati: III  TM Distance: >3 FB Neck ROM: Full   Comment: Previous grade II view with MAC 4, easy mask Dental  (+) Dental Advisory Given   Pulmonary neg shortness of breath, sleep apnea (patient denies) , neg COPD, neg recent URI, former smoker   Pulmonary exam normal breath sounds clear to auscultation       Cardiovascular hypertension (metoprolol, Entresto, spironolactone), Pt. on home beta blockers (-) angina + CAD, + Peripheral Vascular Disease, +CHF and + DVT (h/o bilateral DVT after knee surgery)  (-) Past MI, (-) Cardiac Stents and (-) CABG + dysrhythmias (RBBB, 1st degree AV block) Atrial Fibrillation + Valvular Problems/Murmurs AI, AS and MR  Rhythm:Regular Rate:Normal + Systolic murmurs Dilated aortic root, HLD  TTE 02/19/2023: IMPRESSIONS     1. Left ventricular ejection fraction, by estimation, is 45 to 50%. The  left ventricle has mildly decreased function. The left ventricle  demonstrates global hypokinesis. There is mild left ventricular  hypertrophy of the basal-septal segment. Left  ventricular diastolic parameters are consistent with Grade II diastolic  dysfunction (pseudonormalization). Elevated left atrial pressure.   2. Right ventricular systolic function is normal. The right ventricular  size is normal. Tricuspid regurgitation signal is inadequate for assessing  PA pressure.   3. Left atrial size was severely dilated.   4. The mitral valve is normal in structure. Mild mitral valve  regurgitation. No evidence of mitral stenosis.   5. The aortic valve has an indeterminant number of cusps. There is  moderate calcification of the  aortic valve. There is moderate thickening  of the aortic valve. Aortic valve regurgitation is mild to moderate.  Moderate aortic valve stenosis. Aortic  regurgitation PHT measures 524 msec. Aortic valve area, by VTI measures  1.62 cm. Aortic valve mean gradient measures 19.0 mmHg. Aortic valve Vmax  measures 3.00 m/s. DVI 0.33.   6. The ascending aortic dimensions are 4.2cm and when indexed to BSA and  gender, this is in the normal range.   7. The inferior vena cava is normal in size with greater than 50%  respiratory variability, suggesting right atrial pressure of 3 mmHg.   8. Compared to study dated 12/22/30, no significant change in degree of AS.     Neuro/Psych neg Seizures  Anxiety      Neuromuscular disease (left-sided low back pain with sciatica)    GI/Hepatic negative GI ROS, Neg liver ROS,,,  Endo/Other  diabetes (Hgb A1c 7.6), Poorly Controlled, Type 2, Insulin Dependent, Oral Hypoglycemic Agents    Renal/GU CRFRenal disease (multiple renal cysts, h/o RCC s/p left partial nephrectomy 05/2013)   BPH, prostate cancer    Musculoskeletal  (+) Arthritis , Osteoarthritis,    Abdominal  (+) + obese  Peds  Hematology negative hematology ROS (+)   Anesthesia Other Findings   Reproductive/Obstetrics                             Anesthesia Physical Anesthesia Plan  ASA: 3  Anesthesia Plan: General   Post-op Pain Management: Tylenol PO (pre-op)*   Induction: Intravenous  PONV Risk Score and Plan: 2 and Ondansetron, Dexamethasone and Treatment may vary due to age or medical  condition  Airway Management Planned: Oral ETT  Additional Equipment: ClearSight  Intra-op Plan:   Post-operative Plan: Extubation in OR  Informed Consent: I have reviewed the patients History and Physical, chart, labs and discussed the procedure including the risks, benefits and alternatives for the proposed anesthesia with the patient or authorized representative who  has indicated his/her understanding and acceptance.     Dental advisory given  Plan Discussed with: CRNA and Anesthesiologist  Anesthesia Plan Comments: (See PAT note 04/12/2023  Risks of general anesthesia discussed including, but not limited to, sore throat, hoarse voice, chipped/damaged teeth, injury to vocal cords, nausea and vomiting, allergic reactions, lung infection, heart attack, stroke, and death. All questions answered. )       Anesthesia Quick Evaluation

## 2023-04-16 NOTE — Progress Notes (Signed)
Anesthesia Chart Review   Case: [id number] Date/Time: 04/22/23 1100   Procedures:      XI ROBOTIC ASSISTED LAPAROSCOPIC RADICAL PROSTATECTOMY LEVEL 2     BILATERAL PELVIC LYMPHADENECTOMY (Bilateral)   Anesthesia type: General   Pre-op diagnosis: PROSTATE CANCER   Location: [facility] ROOM 03 / [facility]   Surgeons: Heloise Purpura, MD       DISCUSSION: 72 year old former smoker with history of HTN, RBBB, PAF/atrial flutter s/p ablation 2022 without recurrence,, nonischemic cardiomyopathy, moderate aortic stenosis (mean gradient 19.0 mmHg, valve area 1.62 cm on echo 02/19/2023), CAD, DM, PVD, prostate cancer scheduled for above procedure 04/22/2023 with Dr. Heloise Purpura.  Dilated aortic root 41 mm on TEE in 2022 being monitored by cardiology, 42 mm on echo 02/19/2023 and.  Per cardiology preoperative evaluation 03/22/2023, "Chart reviewed as part of pre-operative protocol coverage. Given past medical history and time since last visit, based on ACC/AHA guidelines, ANTWONE TWADDLE would be at acceptable risk for the planned procedure without further cardiovascular testing.    Class IV risk, 11% risk of major cardiac event.  He is able to complete greater than 4 METS of physical activity."  Anticipate pt can proceed with planned procedure barring acute status change.   VS: BP (!) 150/74   Pulse 68   Temp 36.8 C (Oral)   Ht 5\' 11"  (1.803 m)   Wt 103.9 kg   SpO2 97%   BMI 31.94 kg/m   PROVIDERS: Hamrick, Durward Fortes, MD is PCP  Cardiologist - Dr. Donato Schultz  LABS: Labs reviewed: Acceptable for surgery. (all labs ordered are listed, but only abnormal results are displayed)  Labs Reviewed  HEMOGLOBIN A1C - Abnormal; Notable for the following components:      Result Value   Hgb A1c MFr Bld 7.6 (*)    All other components within normal limits  CBC - Abnormal; Notable for the following components:   RBC 5.93 (*)    All other components within normal limits  BASIC METABOLIC PANEL - Abnormal; Notable for  the following components:   Glucose, Bld 188 (*)    Calcium 8.8 (*)    All other components within normal limits  GLUCOSE, CAPILLARY - Abnormal; Notable for the following components:   Glucose-Capillary 195 (*)    All other components within normal limits  TYPE AND SCREEN     IMAGES:   EKG:   CV: Echo 02/19/2023  1. Left ventricular ejection fraction, by estimation, is 45 to 50%. The  left ventricle has mildly decreased function. The left ventricle  demonstrates global hypokinesis. There is mild left ventricular  hypertrophy of the basal-septal segment. Left  ventricular diastolic parameters are consistent with Grade II diastolic  dysfunction (pseudonormalization). Elevated left atrial pressure.   2. Right ventricular systolic function is normal. The right ventricular  size is normal. Tricuspid regurgitation signal is inadequate for assessing  PA pressure.   3. Left atrial size was severely dilated.   4. The mitral valve is normal in structure. Mild mitral valve  regurgitation. No evidence of mitral stenosis.   5. The aortic valve has an indeterminant number of cusps. There is  moderate calcification of the aortic valve. There is moderate thickening  of the aortic valve. Aortic valve regurgitation is mild to moderate.  Moderate aortic valve stenosis. Aortic  regurgitation PHT measures 524 msec. Aortic valve area, by VTI measures  1.62 cm. Aortic valve mean gradient measures 19.0 mmHg. Aortic valve Vmax  measures 3.00 m/s. DVI  0.33.   6. The ascending aortic dimensions are 4.2cm and when indexed to BSA and  gender, this is in the normal range.   7. The inferior vena cava is normal in size with greater than 50%  respiratory variability, suggesting right atrial pressure of 3 mmHg.   8. Compared to study dated 12/22/30, no significant change in degree of AS.  Past Medical History:  Diagnosis Date   Anxiety    Arthritis    everywhere   Arthrosis of right acromioclavicular  joint 01/29/2020   Atrial flutter [facility])    Onset May 2014 Cardioversion done    BPH associated with nocturia    BPH with obstruction/lower urinary tract symptoms 01/04/2017   CAD (coronary artery disease) 04/02/2013   Cath 2012 moderate mid LAD lesion, otherwise not much disease    Cancer (HCC)    Diabetes (HCC) 01/18/2018   Dilated aortic root (HCC)    aortic root 44 mm, ascending aorta 46 mm 12/22/21 echo   Encounter for long-term (current) use of other medications 07/27/2013   Essential hypertension, benign    First degree heart block    Heart murmur    mild-moderate AS and AR 01/11/22   History of bilateral knee replacement 02/07/2017   History of bladder stone    History of kidney stones    History of melanoma excision    June 2016  --- s/p  excision right  leg (per pt localized and no recurrence)   Hypertensive heart disease 04/02/2013   Impingement syndrome of right shoulder 01/29/2020   Impotence of organic origin    Left-sided low back pain with left-sided sciatica 01/23/2019   Long term current use of anticoagulant therapy    Mixed hyperlipidemia    slightly   Multiple renal cysts    bilateral   Nephrolithiasis 05/17/2014   Nonischemic cardiomyopathy (HCC)    Osteoarthritis    PAF (paroxysmal atrial fibrillation) (HCC)    followed by cardiology  (takes ASA)   Peripheral vascular disease (HCC)    RBBB    RBBB (right bundle branch block with left anterior fascicular block) 04/02/2013   Renal oncocytoma of left kidney 06/18/2013   s/p left robotic assited laparoscopic partial nephrectomy 06/18/13   Right ureteral stone    Rotator cuff syndrome of left shoulder 04/17/2016   Status post total left knee replacement 01/01/2020   Tendinopathy of right biceps tendon 01/29/2020   Tendinopathy of right rotator cuff 01/29/2020   Type 2 diabetes mellitus treated with insulin [facility])    endocrinologist-  dr Everardo All   Wears glasses     Past Surgical History:  Procedure  Laterality Date   A-FLUTTER ABLATION N/A 10/27/2021   Procedure: A-FLUTTER ABLATION;  Surgeon: Lanier Prude, MD;  Location[facility] INVASIVE CV LAB;  Service: Cardiovascular;  Laterality: N/A;   CARDIAC CATHETERIZATION  08-17-2011  DR Karleen Hampshire TILLEY   POSSIBLE MODERATE LAD STENOSIS JUST AFTER THE BIFURCATION OF LARGE DIAGONAL BRANCH/ LVEF  40-45%/ MILD GLOBAL HYPODINESIS (CATH DONE FOR ABNORMAL STRESS TEST OF FIXED LATERAL WALL DEFECT & BIFASCICULAR BLAOCK)   CARDIOVERSION N/A 04/02/2013   Procedure: CARDIOVERSION;  Surgeon: Othella Boyer, MD;  Location: [facility];  Service: Cardiovascular;  Laterality: N/A;   CARDIOVERSION N/A 12/27/2020   Procedure: CARDIOVERSION;  Surgeon: Chilton Si, MD;  Location: [facility] ENDOSCOPY;  Service: Cardiovascular;  Laterality: N/A;   CYSTOSCOPY WITH LITHOLAPAXY N/A 05/18/2015   Procedure: CYSTOSCOPY WITH LITHOLAPAXY;  Surgeon: Barron Alvine, MD;  Location: [facility];  Service: Urology;  Laterality: N/A;   CYSTOSCOPY WITH LITHOLAPAXY N/A 01/04/2017   Procedure: CYSTOSCOPY WITH LITHOLAPAXY, Holmium Laser;  Surgeon: Crist Fat, MD;  Location[facility];  Service: Urology;  Laterality: N/A;   CYSTOSCOPY WITH RETROGRADE PYELOGRAM, URETEROSCOPY AND STENT PLACEMENT  11/03/2012   Procedure: CYSTOSCOPY WITH RETROGRADE PYELOGRAM, URETEROSCOPY AND STENT PLACEMENT;  Surgeon: Valetta Fuller, MD;  Location: [facility];  Service: Urology;  Laterality: Left;  Cystoscopy/Left Retrograde/Ureteroscopy/Holmium Laser Litho/Double J Stent   CYSTOSCOPY WITH URETEROSCOPY, STONE BASKETRY AND STENT PLACEMENT Right 05/15/2019   Procedure: CYSTOSCOPY WITH URETEROSCOPY, LASER LITHOTRIPSY STONE BASKETRY AND STENT PLACEMENT, RIGHT RETROGRADE;  Surgeon: Crist Fat, MD;  Location: [facility];  Service: Urology;  Laterality: Right;   HOLMIUM LASER APPLICATION  11/03/2012   Procedure: HOLMIUM LASER APPLICATION;  Surgeon: Valetta Fuller,  MD;  Location: [facility];  Service: Urology;  Laterality: Left;   HOLMIUM LASER APPLICATION N/A 05/18/2015   Procedure: HOLMIUM LASER APPLICATION;  Surgeon: Barron Alvine, MD;  Location: [facility];  Service: Urology;  Laterality: N/A;   KNEE SURGERY Bilateral 1978  &  1974   MELANOMA EXCISION Bilateral 04/2015   20th-- left leg //  1st-- right leg w/ skin graft from right thigh (no lymph node bx)   NEPHROLITHOTOMY Right 05/17/2014   Procedure: NEPHROLITHOTOMY PERCUTANEOUS;  Surgeon: Valetta Fuller, MD;  [facility];  Service: Urology;  Laterality: Right;   PILONIDAL CYST EXCISION     PROSTATE BIOPSY     ROBOTIC ASSITED PARTIAL NEPHRECTOMY Left 06/18/2013   Procedure: ROBOTIC ASSISTED PARTIAL NEPHRECTOMY;  Surgeon: [name], MD;  Location[facility];  Service: Urology;  Laterality: Left;   ROTATOR CUFF REPAIR Left 07/2016   SHOULDER ARTHROSCOPY WITH ROTATOR CUFF REPAIR AND SUBACROMIAL DECOMPRESSION Right 05/27/2020   Procedure: RIGHT SHOULDER ARTHROSCOPY WITH EXTENSIVE DEBRIDEMENT, DISTAL CLAVICLE EXCISION AND SUBACROMIAL DECOMPRESSION;  Surgeon: Tarry Kos, MD;  Location: [facility];  Service: Orthopedics;  Laterality: Right;   TEE WITH CARDIOVERSION  12/27/2020   TEE WITHOUT CARDIOVERSION N/A 12/27/2020   Procedure: TRANSESOPHAGEAL ECHOCARDIOGRAM (TEE);  Surgeon: Chilton Si, MD;  Location: [facility] ENDOSCOPY;  Service: Cardiovascular;  Laterality: N/A;   TONSILLECTOMY  66  (age 66)   TOTAL KNEE ARTHROPLASTY Left 02/07/2017   Procedure: LEFT TOTAL KNEE ARTHROPLASTY;  Surgeon: Tarry Kos, MD;  Location: [facility];  Service: Orthopedics;  Laterality: Left;   TOTAL KNEE ARTHROPLASTY Right 04/25/2017   Procedure: RIGHT TOTAL KNEE ARTHROPLASTY;  Surgeon: Tarry Kos, MD;  Location: [facility];  Service: Orthopedics;  Laterality: Right;   TOTAL KNEE REVISION Left 07/13/2022   Procedure: LEFT TOTAL KNEE REVISION, TIBIAL COMPONENT;  Surgeon: Tarry Kos, MD;  Location: [facility];  Service: Orthopedics;  Laterality: Left;   TRANSURETHRAL RESECTION OF PROSTATE N/A 01/04/2017   Procedure: TRANSURETHRAL RESECTION OF THE PROSTATE (TURP);  Surgeon: Crist Fat, MD;  Location[facility];  Service: Urology;  Laterality: N/A;    MEDICATIONS:  dapagliflozin propanediol (FARXIGA) 10 MG TABS tablet   insulin degludec (TRESIBA FLEXTOUCH) 200 UNIT/ML FlexTouch Pen   Insulin Pen Needle (PEN NEEDLES) 30G X 8 MM MISC   Lancets (ONETOUCH DELICA PLUS LANCET33G) MISC   methocarbamol (ROBAXIN) 750 MG tablet   metoprolol succinate (TOPROL-XL) 50 MG 24 hr tablet   nitroGLYCERIN (NITROSTAT) 0.4 MG SL tablet   ONETOUCH ULTRA test strip   rosuvastatin (CRESTOR) 20 MG  tablet   sacubitril-valsartan (ENTRESTO) 97-103 MG   Semaglutide,0.25 or 0.5MG /DOS, 2 MG/3ML SOPN   spironolactone (ALDACTONE) 25 MG tablet   trolamine salicylate (ASPERCREME) 10 % cream   No current facility-administered medications for this encounter.      Jodell Cipro Ward, PA-C [facility] [phone]

## 2023-04-18 ENCOUNTER — Other Ambulatory Visit: Payer: Self-pay | Admitting: Cardiology

## 2023-04-19 NOTE — H&P (Signed)
Office Visit Report     04/09/2023   --------------------------------------------------------------------------------   Matthew Schmitt  MRN: [id number]  DOB: Jun 09, 1951, 72 year old Male  SSN: [id number]   PRIMARY CARE:  Maura L. Hamrick, MD  PRIMARY CARE FAX:  [phone]  REFERRING:  Crist Fat, MD  PROVIDER:  Berniece Salines, M.D.  TREATING:  Buzzy Han, PA-C  LOCATION:  [facility] [phone]     --------------------------------------------------------------------------------   CC/HPI: 03/28/23 Patient is here preop before prostatectomy scheduled 03/28/23. 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 Primarily on the phone and on the computer, site visits on occasion. DM2 on insulin, and being endocrinologist. A1C 7.1   03/20/23: 72 y/o M with involved medical history for pre op PT, non nerve sparing RALP pending 04/22/23. No current LUTS other than AM frequency after taking diuretic.  No regular exercise, sedentary desk job during week, active with yard work on weekends.   Assessment: Pt presents with good pelvic floor awareness and coordination. PT to provide appropriate individualized program to enhance neuromuscular control and contribute to post operative continence.   04/09/2023:  Patient presents for presurgical clearance for his upcoming RAL radical prostatectomy with bilateral pelvic lymphadenectomy on 6/3. Patient continues on [place]. Since last seen, patient has had no new changes to his medical history, no procedures, new medications, new diagnosis. He remains at baseline voiding function, without overt concern. He does endorse intermittent gross hematuria per baseline. He denies irritative symptoms, fever/chills, nausea/vomiting. He states that he is well-informed with regards to upcoming procedure, and has few questions. However, patient is concerned for recurrence of postoperative DVT. He states he had bilateral lower extremity DVTs status post knee  surgery several years ago, which were painful for 4 months until resolved.     ALLERGIES: No Allergies    MEDICATIONS: Metoprolol Succinate 50 mg tablet, extended release 24 hr  Aspercreame  Entresto 49 mg-51 mg tablet  Farxiga 10 mg tablet  Nitroglycerin 0.4 mg tablet, sublingual  Ozempic 0.25 mg or 0.5 mg dose (2 mg/3 ml) pen injector  Rosuvastatin Calcium 10 mg tablet  Spironolactone 25 mg tablet  Tresiba     GU PSH: Cysto Bladder Stone <2.5cm - 2016 Cysto Bladder Stone >2.5cm - 2018 Cysto Uretero Lithotripsy - 2013 Cystoscopy - 2021, 2017 Cystoscopy Insert Stent - 2013 Cystoscopy TURP - 2018 Locm 300-399Mg /Ml Iodine,1Ml - 2020 Partial nephrectomy (laparoscopic) - 2014 Percut Stone Removal >2cm - 2015 Prostate Needle Biopsy - 01/31/2023 Ureteroscopic laser litho, Right - 2020       PSH Notes: Knee Surgery   NON-GU PSH: Back Surgery (Unspecified) - 2009 Remove Tonsils - 2009 Shoulder Arthroscopy/surgery, Left - 2017 Surgical Pathology, Gross And Microscopic Examination For Prostate Needle - 01/31/2023     GU PMH: Stress Incontinence - 03/28/2023 Prostate Cancer - 03/12/2023, - 02/07/2023 Elevated PSA - 01/31/2023, - 12/14/2022, - 07/10/2022 Encounter for Prostate Cancer screening - 12/14/2022, - 2019 Weak Urinary Stream - 12/14/2022, - 2017 Renal calculus - 2020, - 2019, Nonobstructive, and asymptomatic currently., - 2018, - 2017, Nephrolithiasis, - 2016 Urinary Urgency - 2017 Flank Pain (Acute), Right, Ketorolac 30 mg IM PRN NSAID - 2017 Acute Cystitis/UTI - 2017 Bladder Stone, Bladder calculus - 2016 Urinary Tract Inf, Unspec site, Pyuria - 2016 BPH w/LUTS, Benign prostatic hyperplasia with urinary obstruction - 2016 Urinary Frequency, Increased urinary frequency - 2016 Abdominal Pain Unspec, Left flank pain - 2016 Benign tumor right kidney, Renal oncocytoma, right - 2015 Benign Neo Kidney, Unspec,  Renal oncocytoma, unspecified laterality - 2015 Gross hematuria, Gross  hematuria - 2015 ED due to arterial insufficiency, Erectile dysfunction due to arterial insufficiency - 2014 Ureteral calculus, Calculus of ureter - 2014      PMH Notes: 2015 - right PCNL for 3cm stone  2016 - cystolithalopaxy  2018 - s/p TURP and cystolithalopaxy- three large bladder stones removed.   NON-GU PMH: Muscle weakness (generalized) - 03/28/2023, - 03/20/2023 Other muscle spasm - 03/28/2023 Other lack of coordination - 03/20/2023 Encounter for general adult medical examination without abnormal findings, Encounter for preventive health examination - 2016 Personal history of other diseases of the circulatory system, History of hypertension - 2014 Personal history of other endocrine, nutritional and metabolic disease, History of diabetes mellitus - 2014 Unspecified atrial fibrillation, Atrial Fibrillation - 2014    Immunizations: None   FAMILY HISTORY: Diabetes - Runs In Family Hypertension - Runs In Family Kidney Cancer - No Family History   SOCIAL HISTORY: Marital Status: Married Preferred Language: English; Ethnicity: Not Hispanic Or Latino; Race: White Current Smoking Status: Patient does not smoke anymore. Has not smoked since 04/19/1996.  Does drink.  Drinks 1 caffeinated drink per day.    REVIEW OF SYSTEMS:    GU Review Male:   Patient denies frequent urination, hard to postpone urination, burning/ pain with urination, get up at night to urinate, leakage of urine, stream starts and stops, trouble starting your stream, have to strain to urinate , erection problems, and penile pain.  Gastrointestinal (Upper):   Patient denies nausea, vomiting, and indigestion/ heartburn.  Gastrointestinal (Lower):   Patient denies diarrhea and constipation.  Constitutional:   Patient denies fever, night sweats, weight loss, and fatigue.  Skin:   Patient denies skin rash/ lesion and itching.  Eyes:   Patient denies double vision and blurred vision.  Ears/ Nose/ Throat:   Patient denies sore  throat and sinus problems.  Hematologic/Lymphatic:   Patient denies swollen glands and easy bruising.  Cardiovascular:   Patient denies leg swelling and chest pains.  Respiratory:   Patient denies cough and shortness of breath.  Endocrine:   Patient denies excessive thirst.  Musculoskeletal:   Patient denies back pain and joint pain.  Neurological:   Patient denies headaches and dizziness.  Psychologic:   Patient denies depression and anxiety.   Notes: Intermittent gross hematuria    VITAL SIGNS:      04/09/2023 08:05 AM  BP 170/81 mmHg  Pulse 66 /min  Temperature 97.5 F / 36.3 C   MULTI-SYSTEM PHYSICAL EXAMINATION:    Constitutional: Obese. No physical deformities. Normally developed. Good grooming. Patient is pleasant, in no acute discomfort, distress.  Respiratory: No labored breathing, no use of accessory muscles. Lungs clear to auscultation bilaterally. No wheezing, rales.  Cardiovascular: Normal temperature, normal extremity pulses, no swelling. Regular rate and rhythm. No murmurs, gallops.  Skin: No paleness, no jaundice, no cyanosis.  Neurologic / Psychiatric: Oriented to time, oriented to place, oriented to person. No depression, no anxiety, no agitation.  Gastrointestinal: Obese abdomen. No mass, no suprapubic tenderness, no rigidity.      Complexity of Data:  Source Of History:  Patient, Medical Record Summary  Records Review:   Pathology Reports, Previous Doctor Records, Previous Patient Records  Urine Test Review:   Urinalysis   12/14/22 07/10/22 05/17/20 04/15/18  PSA  Total PSA 7.41 ng/mL 4.88 ng/mL 2.66 ng/mL 1.42 ng/mL    04/09/23  Urinalysis  Urine Appearance Clear   Urine Color Yellow  Urine Glucose 3+ mg/dL  Urine Bilirubin Neg mg/dL  Urine Ketones Neg mg/dL  Urine Specific Gravity 1.015   Urine Blood Neg ery/uL  Urine pH <=5.0   Urine Protein Neg mg/dL  Urine Urobilinogen 0.2 mg/dL  Urine Nitrites Neg   Urine Leukocyte Esterase Neg leu/uL    PROCEDURES:          Urinalysis Dipstick Dipstick Cont'd  Color: Yellow Bilirubin: Neg mg/dL  Appearance: Clear Ketones: Neg mg/dL  Specific Gravity: 1.610 Blood: Neg ery/uL  pH: <=5.0 Protein: Neg mg/dL  Glucose: 3+ mg/dL Urobilinogen: 0.2 mg/dL    Nitrites: Neg    Leukocyte Esterase: Neg leu/uL    ASSESSMENT:      ICD-10 Details  1 GU:   Prostate Cancer - C61 Chronic, Stable   PLAN:           Orders Labs Urine Culture          Schedule Return Visit/Planned Activity: Keep Scheduled Appointment             Note: Surgery          Document Letter(s):  Created for Patient: Clinical Summary         Notes:   Today, UA WNL except for glucosuria. We will send for presurgical clearance and follow-up with patient as appropriate. Patient endorses no new changes since last seen, and remains at baseline voiding function.   We reviewed with patient his upcoming procedure in detail, and discussed poor surgical recovery, Foley catheter removal. We answered his questions to the best of our abilities. We did advise patient to inform preoperative staff at upcoming appointment tomorrow about his history of lower leg DVTs postoperatively.   Keep follow-up surgical appointment on 6/3. Patient knows to inform the office should he experience any interim changes to his medical health. Patient voiced understanding and is amenable to this plan.        Next Appointment:      Next Appointment: 04/22/2023 11:15 AM    Appointment Type: Surgery     Location: [facility] [phone]    Provider: Heloise Purpura, M.D.    Reason for Visit: [facility]/OBS RA LAP RAD PROSTATECTOMY LEV 2 AND BPLND WITH AMANDA      * Signed by Buzzy Han, PA-C on 04/09/23 at 9:11 AM (EDT)*

## 2023-04-22 ENCOUNTER — Observation Stay (HOSPITAL_COMMUNITY)
Admission: RE | Admit: 2023-04-22 | Discharge: 2023-04-23 | Disposition: A | Discharge status: Home / Self Care | Payer: BC Managed Care – PPO | Attending: Urology | Admitting: Urology

## 2023-04-22 ENCOUNTER — Ambulatory Visit (HOSPITAL_COMMUNITY): Payer: BC Managed Care – PPO | Admitting: Certified Registered"

## 2023-04-22 ENCOUNTER — Encounter (HOSPITAL_COMMUNITY): Payer: Self-pay | Admitting: Urology

## 2023-04-22 ENCOUNTER — Ambulatory Visit (HOSPITAL_COMMUNITY): Payer: BC Managed Care – PPO | Admitting: Physician Assistant

## 2023-04-22 ENCOUNTER — Other Ambulatory Visit: Payer: Self-pay

## 2023-04-22 ENCOUNTER — Encounter (HOSPITAL_COMMUNITY)
Admission: RE | Disposition: A | Discharge status: Home / Self Care | Payer: Self-pay | Source: Home / Self Care | Attending: Urology

## 2023-04-22 DIAGNOSIS — C61 Malignant neoplasm of prostate: Secondary | ICD-10-CM | POA: Diagnosis present

## 2023-04-22 DIAGNOSIS — Z79899 Other long term (current) drug therapy: Secondary | ICD-10-CM | POA: Diagnosis not present

## 2023-04-22 DIAGNOSIS — Z794 Long term (current) use of insulin: Secondary | ICD-10-CM | POA: Diagnosis not present

## 2023-04-22 DIAGNOSIS — E119 Type 2 diabetes mellitus without complications: Secondary | ICD-10-CM | POA: Diagnosis not present

## 2023-04-22 DIAGNOSIS — Z87891 Personal history of nicotine dependence: Secondary | ICD-10-CM | POA: Insufficient documentation

## 2023-04-22 HISTORY — PX: ROBOT ASSISTED LAPAROSCOPIC RADICAL PROSTATECTOMY: SHX5141

## 2023-04-22 HISTORY — PX: LYMPHADENECTOMY: SHX5960

## 2023-04-22 LAB — GLUCOSE, CAPILLARY
Glucose-Capillary: 70 mg/dL (ref 70–99)
Glucose-Capillary: 85 mg/dL (ref 70–99)
Glucose-Capillary: 100 mg/dL — ABNORMAL HIGH (ref 70–99)
Glucose-Capillary: 112 mg/dL — ABNORMAL HIGH (ref 70–99)
Glucose-Capillary: 167 mg/dL — ABNORMAL HIGH (ref 70–99)

## 2023-04-22 LAB — HEMOGLOBIN AND HEMATOCRIT, BLOOD
HCT: 47 % (ref 39.0–52.0)
Hemoglobin: 15.1 g/dL (ref 13.0–17.0)

## 2023-04-22 SURGERY — XI ROBOTIC ASSISTED LAPAROSCOPIC RADICAL PROSTATECTOMY LEVEL 2
Anesthesia: General | Site: Abdomen

## 2023-04-22 MED ORDER — FENTANYL CITRATE (PF) 100 MCG/2ML IJ SOLN
INTRAMUSCULAR | Status: DC | PRN
  Administered 2023-04-22 (×3): 50 ug via INTRAVENOUS

## 2023-04-22 MED ORDER — OXYCODONE HCL 5 MG/5ML PO SOLN: 5.0000 mg | Freq: Once | ORAL | Status: DC | PRN

## 2023-04-22 MED ORDER — INSULIN ASPART 100 UNIT/ML IJ SOLN
0.0000 [IU] | INTRAMUSCULAR | Status: DC
  Administered 2023-04-23 (×2): 3 [IU] via SUBCUTANEOUS
  Administered 2023-04-23: 2 [IU] via SUBCUTANEOUS

## 2023-04-22 MED ORDER — ROSUVASTATIN CALCIUM 20 MG PO TABS
20.0000 mg | ORAL_TABLET | Freq: Every day | ORAL | Status: DC
  Administered 2023-04-22 – 2023-04-23 (×2): 20 mg via ORAL
  Filled 2023-04-22 (×2): qty 1

## 2023-04-22 MED ORDER — STERILE WATER FOR IRRIGATION IR SOLN
Status: DC | PRN
  Administered 2023-04-22: 1000 mL

## 2023-04-22 MED ORDER — ONDANSETRON HCL 4 MG/2ML IJ SOLN
INTRAMUSCULAR | Status: AC
  Filled 2023-04-22: qty 2

## 2023-04-22 MED ORDER — PHENYLEPHRINE HCL-NACL 20-0.9 MG/250ML-% IV SOLN
INTRAVENOUS | Status: DC | PRN
  Administered 2023-04-22: 20 ug/min via INTRAVENOUS

## 2023-04-22 MED ORDER — HYOSCYAMINE SULFATE 0.125 MG SL SUBL
0.1250 mg | SUBLINGUAL_TABLET | Freq: Four times a day (QID) | SUBLINGUAL | Status: DC | PRN
  Administered 2023-04-22: 0.125 mg via SUBLINGUAL
  Filled 2023-04-22: qty 1

## 2023-04-22 MED ORDER — BUPIVACAINE HCL 0.25 % IJ SOLN
INTRAMUSCULAR | Status: DC | PRN
  Administered 2023-04-22: 31 mL

## 2023-04-22 MED ORDER — TRIPLE ANTIBIOTIC 3.5-400-5000 EX OINT: 1.0000 | TOPICAL_OINTMENT | Freq: Three times a day (TID) | CUTANEOUS | Status: DC | PRN

## 2023-04-22 MED ORDER — BUPIVACAINE HCL 0.25 % IJ SOLN
INTRAMUSCULAR | Status: AC
  Filled 2023-04-22: qty 1

## 2023-04-22 MED ORDER — ACETAMINOPHEN 325 MG PO TABS: 650.0000 mg | ORAL_TABLET | ORAL | Status: DC | PRN

## 2023-04-22 MED ORDER — ONDANSETRON HCL 4 MG/2ML IJ SOLN
INTRAMUSCULAR | Status: DC | PRN
  Administered 2023-04-22: 4 mg via INTRAVENOUS

## 2023-04-22 MED ORDER — FENTANYL CITRATE PF 50 MCG/ML IJ SOSY
25.0000 ug | PREFILLED_SYRINGE | INTRAMUSCULAR | Status: DC | PRN
  Administered 2023-04-22 (×2): 50 ug via INTRAVENOUS

## 2023-04-22 MED ORDER — ORAL CARE MOUTH RINSE: 15.0000 mL | Freq: Once | OROMUCOSAL | Status: AC

## 2023-04-22 MED ORDER — SACUBITRIL-VALSARTAN 97-103 MG PO TABS
1.0000 | ORAL_TABLET | Freq: Two times a day (BID) | ORAL | Status: DC
  Administered 2023-04-22 – 2023-04-23 (×2): 1 via ORAL
  Filled 2023-04-22 (×2): qty 1

## 2023-04-22 MED ORDER — AMISULPRIDE (ANTIEMETIC) 5 MG/2ML IV SOLN: 10.0000 mg | Freq: Once | INTRAVENOUS | Status: DC | PRN

## 2023-04-22 MED ORDER — TRAMADOL HCL 50 MG PO TABS: 50.0000 mg | ORAL_TABLET | Freq: Four times a day (QID) | ORAL | 0 refills | Status: DC | PRN

## 2023-04-22 MED ORDER — SUGAMMADEX SODIUM 200 MG/2ML IV SOLN
INTRAVENOUS | Status: DC | PRN
  Administered 2023-04-22: 200 mg via INTRAVENOUS

## 2023-04-22 MED ORDER — SPIRONOLACTONE 12.5 MG HALF TABLET
12.5000 mg | ORAL_TABLET | Freq: Every day | ORAL | Status: DC
  Administered 2023-04-22 – 2023-04-23 (×2): 12.5 mg via ORAL
  Filled 2023-04-22 (×2): qty 1

## 2023-04-22 MED ORDER — LACTATED RINGERS IV SOLN: INTRAVENOUS | Status: DC | PRN

## 2023-04-22 MED ORDER — DOCUSATE SODIUM 100 MG PO CAPS
100.0000 mg | ORAL_CAPSULE | Freq: Two times a day (BID) | ORAL | Status: DC
  Administered 2023-04-22 – 2023-04-23 (×2): 100 mg via ORAL
  Filled 2023-04-22 (×2): qty 1

## 2023-04-22 MED ORDER — SODIUM CHLORIDE 0.9 % IR SOLN
Status: DC | PRN
  Administered 2023-04-22: 1000 mL via INTRAVESICAL

## 2023-04-22 MED ORDER — SODIUM CHLORIDE 0.9 % IV BOLUS
1000.0000 mL | Freq: Once | INTRAVENOUS | Status: AC
  Administered 2023-04-22: 1000 mL via INTRAVENOUS

## 2023-04-22 MED ORDER — ROCURONIUM BROMIDE 10 MG/ML (PF) SYRINGE
PREFILLED_SYRINGE | INTRAVENOUS | Status: AC
  Filled 2023-04-22: qty 10

## 2023-04-22 MED ORDER — ORAL CARE MOUTH RINSE: 15.0000 mL | OROMUCOSAL | Status: DC | PRN

## 2023-04-22 MED ORDER — FENTANYL CITRATE PF 50 MCG/ML IJ SOSY
PREFILLED_SYRINGE | INTRAMUSCULAR | Status: AC
  Filled 2023-04-22: qty 2

## 2023-04-22 MED ORDER — DIPHENHYDRAMINE HCL 50 MG/ML IJ SOLN: 12.5000 mg | Freq: Four times a day (QID) | INTRAMUSCULAR | Status: DC | PRN

## 2023-04-22 MED ORDER — LIDOCAINE 2% (20 MG/ML) 5 ML SYRINGE
INTRAMUSCULAR | Status: DC | PRN
  Administered 2023-04-22: 100 mg via INTRAVENOUS

## 2023-04-22 MED ORDER — HYDRALAZINE HCL 20 MG/ML IJ SOLN: 5.0000 mg | INTRAMUSCULAR | Status: DC | PRN

## 2023-04-22 MED ORDER — DIPHENHYDRAMINE HCL 12.5 MG/5ML PO ELIX: 12.5000 mg | ORAL_SOLUTION | Freq: Four times a day (QID) | ORAL | Status: DC | PRN

## 2023-04-22 MED ORDER — MORPHINE SULFATE (PF) 2 MG/ML IV SOLN
2.0000 mg | INTRAVENOUS | Status: DC | PRN
  Administered 2023-04-22: 2 mg via INTRAVENOUS
  Administered 2023-04-22: 4 mg via INTRAVENOUS
  Administered 2023-04-22: 2 mg via INTRAVENOUS
  Filled 2023-04-22: qty 2
  Filled 2023-04-22 (×2): qty 1

## 2023-04-22 MED ORDER — ONDANSETRON HCL 4 MG/2ML IJ SOLN: 4.0000 mg | INTRAMUSCULAR | Status: DC | PRN

## 2023-04-22 MED ORDER — ACETAMINOPHEN 500 MG PO TABS
1000.0000 mg | ORAL_TABLET | Freq: Once | ORAL | Status: AC
  Administered 2023-04-22: 1000 mg via ORAL
  Filled 2023-04-22: qty 2

## 2023-04-22 MED ORDER — FENTANYL CITRATE (PF) 100 MCG/2ML IJ SOLN
INTRAMUSCULAR | Status: AC
  Filled 2023-04-22: qty 2

## 2023-04-22 MED ORDER — OXYCODONE HCL 5 MG PO TABS: 5.0000 mg | ORAL_TABLET | Freq: Once | ORAL | Status: DC | PRN

## 2023-04-22 MED ORDER — CEFAZOLIN SODIUM-DEXTROSE 2-4 GM/100ML-% IV SOLN
2.0000 g | INTRAVENOUS | Status: AC
  Administered 2023-04-22: 2 g via INTRAVENOUS
  Filled 2023-04-22: qty 100

## 2023-04-22 MED ORDER — LACTATED RINGERS IV SOLN: INTRAVENOUS | Status: DC

## 2023-04-22 MED ORDER — CHLORHEXIDINE GLUCONATE 0.12 % MT SOLN
15.0000 mL | Freq: Once | OROMUCOSAL | Status: AC
  Administered 2023-04-22: 15 mL via OROMUCOSAL

## 2023-04-22 MED ORDER — NITROGLYCERIN 0.4 MG SL SUBL: 0.4000 mg | SUBLINGUAL_TABLET | SUBLINGUAL | Status: DC | PRN

## 2023-04-22 MED ORDER — LIDOCAINE HCL (PF) 2 % IJ SOLN
INTRAMUSCULAR | Status: AC
  Filled 2023-04-22: qty 5

## 2023-04-22 MED ORDER — MAGNESIUM CITRATE PO SOLN: 1.0000 | Freq: Once | ORAL | Status: DC

## 2023-04-22 MED ORDER — METOPROLOL SUCCINATE ER 50 MG PO TB24
50.0000 mg | ORAL_TABLET | Freq: Every day | ORAL | Status: DC
  Administered 2023-04-22 – 2023-04-23 (×2): 50 mg via ORAL
  Filled 2023-04-22 (×2): qty 1

## 2023-04-22 MED ORDER — HEPARIN SODIUM (PORCINE) 1000 UNIT/ML IJ SOLN
INTRAMUSCULAR | Status: AC
  Filled 2023-04-22: qty 1

## 2023-04-22 MED ORDER — POTASSIUM CHLORIDE IN NACL 20-0.45 MEQ/L-% IV SOLN
INTRAVENOUS | Status: DC
  Filled 2023-04-22 (×2): qty 1000

## 2023-04-22 MED ORDER — DEXAMETHASONE SODIUM PHOSPHATE 10 MG/ML IJ SOLN
INTRAMUSCULAR | Status: AC
  Filled 2023-04-22: qty 1

## 2023-04-22 MED ORDER — ROCURONIUM BROMIDE 10 MG/ML (PF) SYRINGE
PREFILLED_SYRINGE | INTRAVENOUS | Status: DC | PRN
  Administered 2023-04-22: 10 mg via INTRAVENOUS
  Administered 2023-04-22 (×3): 20 mg via INTRAVENOUS
  Administered 2023-04-22: 60 mg via INTRAVENOUS

## 2023-04-22 MED ORDER — DOCUSATE SODIUM 100 MG PO CAPS: 100.0000 mg | ORAL_CAPSULE | Freq: Two times a day (BID) | ORAL | Status: DC

## 2023-04-22 MED ORDER — KETOROLAC TROMETHAMINE 15 MG/ML IJ SOLN
15.0000 mg | Freq: Four times a day (QID) | INTRAMUSCULAR | Status: DC
  Administered 2023-04-22 – 2023-04-23 (×3): 15 mg via INTRAVENOUS
  Filled 2023-04-22 (×3): qty 1

## 2023-04-22 MED ORDER — PROPOFOL 10 MG/ML IV BOLUS
INTRAVENOUS | Status: AC
  Filled 2023-04-22: qty 20

## 2023-04-22 MED ORDER — CEFAZOLIN SODIUM-DEXTROSE 1-4 GM/50ML-% IV SOLN
1.0000 g | Freq: Three times a day (TID) | INTRAVENOUS | Status: AC
  Administered 2023-04-22 – 2023-04-23 (×2): 1 g via INTRAVENOUS
  Filled 2023-04-22 (×2): qty 50

## 2023-04-22 MED ORDER — DEXAMETHASONE SODIUM PHOSPHATE 10 MG/ML IJ SOLN
INTRAMUSCULAR | Status: DC | PRN
  Administered 2023-04-22: 4 mg via INTRAVENOUS

## 2023-04-22 MED ORDER — SULFAMETHOXAZOLE-TRIMETHOPRIM 800-160 MG PO TABS: 1.0000 | ORAL_TABLET | Freq: Two times a day (BID) | ORAL | 0 refills | Status: DC

## 2023-04-22 MED ORDER — FLEET ENEMA 7-19 GM/118ML RE ENEM: 1.0000 | ENEMA | Freq: Once | RECTAL | Status: DC

## 2023-04-22 MED ORDER — ZOLPIDEM TARTRATE 5 MG PO TABS: 5.0000 mg | ORAL_TABLET | Freq: Every evening | ORAL | Status: DC | PRN

## 2023-04-22 MED ORDER — HYDRALAZINE HCL 20 MG/ML IJ SOLN
INTRAMUSCULAR | Status: AC
  Filled 2023-04-22: qty 1

## 2023-04-22 MED ORDER — PROPOFOL 10 MG/ML IV BOLUS
INTRAVENOUS | Status: DC | PRN
  Administered 2023-04-22: 140 mg via INTRAVENOUS
  Administered 2023-04-22: 60 mg via INTRAVENOUS

## 2023-04-22 SURGICAL SUPPLY — 68 items
ADH SKN CLS APL DERMABOND .7 (GAUZE/BANDAGES/DRESSINGS) ×2
APL PRP STRL LF DISP 70% ISPRP (MISCELLANEOUS) ×2
APL SWBSTK 6 STRL LF DISP (MISCELLANEOUS) ×2
APPLICATOR COTTON TIP 6 STRL (MISCELLANEOUS) ×3 IMPLANT
APPLICATOR COTTON TIP 6IN STRL (MISCELLANEOUS) ×2
BAG COUNTER SPONGE SURGICOUNT (BAG) IMPLANT
BAG SPNG CNTER NS LX DISP (BAG)
CATH FOLEY 2WAY SLVR 18FR 30CC (CATHETERS) ×3 IMPLANT
CATH ROBINSON RED A/P 16FR (CATHETERS) ×3 IMPLANT
CATH ROBINSON RED A/P 8FR (CATHETERS) ×3 IMPLANT
CATH TIEMANN FOLEY 18FR 5CC (CATHETERS) ×3 IMPLANT
CHLORAPREP W/TINT 26 (MISCELLANEOUS) ×3 IMPLANT
CLIP LIGATING HEM O LOK PURPLE (MISCELLANEOUS) ×3 IMPLANT
COVER SURGICAL LIGHT HANDLE (MISCELLANEOUS) ×3 IMPLANT
COVER TIP SHEARS 8 DVNC (MISCELLANEOUS) ×3 IMPLANT
CUTTER ECHEON FLEX ENDO 45 340 (ENDOMECHANICALS) ×3 IMPLANT
DERMABOND ADVANCED .7 DNX12 (GAUZE/BANDAGES/DRESSINGS) ×3 IMPLANT
DRAIN CHANNEL RND F F (WOUND CARE) IMPLANT
DRAPE ARM DVNC X/XI (DISPOSABLE) ×12 IMPLANT
DRAPE COLUMN DVNC XI (DISPOSABLE) ×3 IMPLANT
DRAPE SURG IRRIG POUCH 19X23 (DRAPES) ×3 IMPLANT
DRIVER NDL LRG 8 DVNC XI (INSTRUMENTS) ×6 IMPLANT
DRIVER NDLE LRG 8 DVNC XI (INSTRUMENTS) ×4 IMPLANT
DRSG TEGADERM 4X4.75 (GAUZE/BANDAGES/DRESSINGS) ×3 IMPLANT
ELECT PENCIL ROCKER SW 15FT (MISCELLANEOUS) ×3 IMPLANT
ELECT REM PT RETURN 15FT ADLT (MISCELLANEOUS) ×3 IMPLANT
FORCEPS BPLR LNG DVNC XI (INSTRUMENTS) ×3 IMPLANT
FORCEPS PROGRASP DVNC XI (FORCEP) ×3 IMPLANT
GAUZE 4X4 16PLY ~~LOC~~+RFID DBL (SPONGE) ×3 IMPLANT
GAUZE SPONGE 4X4 12PLY STRL (GAUZE/BANDAGES/DRESSINGS) ×3 IMPLANT
GLOVE BIO SURGEON STRL SZ 6.5 (GLOVE) ×3 IMPLANT
GLOVE SURG LX STRL 7.5 STRW (GLOVE) ×6 IMPLANT
GOWN SRG XL LVL 4 BRTHBL STRL (GOWNS) ×3 IMPLANT
GOWN STRL NON-REIN XL LVL4 (GOWNS) ×2
GOWN STRL REUS W/ TWL XL LVL3 (GOWN DISPOSABLE) ×6 IMPLANT
GOWN STRL REUS W/TWL XL LVL3 (GOWN DISPOSABLE) ×4
HOLDER FOLEY CATH W/STRAP (MISCELLANEOUS) ×3 IMPLANT
IRRIG SUCT STRYKERFLOW 2 WTIP (MISCELLANEOUS) ×2
IRRIGATION SUCT STRKRFLW 2 WTP (MISCELLANEOUS) ×3 IMPLANT
IV LACTATED RINGERS 1000ML (IV SOLUTION) ×3 IMPLANT
KIT TURNOVER KIT A (KITS) IMPLANT
NDL SAFETY ECLIP 18X1.5 (MISCELLANEOUS) ×3 IMPLANT
PACK ROBOT UROLOGY CUSTOM (CUSTOM PROCEDURE TRAY) ×3 IMPLANT
PLUG CATH AND CAP STER (CATHETERS) IMPLANT
RELOAD STAPLE 45 4.1 GRN THCK (STAPLE) ×3 IMPLANT
SCISSORS MNPLR CVD DVNC XI (INSTRUMENTS) ×3 IMPLANT
SEAL UNIV 5-12 XI (MISCELLANEOUS) ×9 IMPLANT
SET CYSTO W/LG BORE CLAMP LF (SET/KITS/TRAYS/PACK) IMPLANT
SET TUBE SMOKE EVAC HIGH FLOW (TUBING) ×3 IMPLANT
SOL ELECTROSURG ANTI STICK (MISCELLANEOUS) ×2
SOLUTION ELECTROSURG ANTI STCK (MISCELLANEOUS) ×3 IMPLANT
SPIKE FLUID TRANSFER (MISCELLANEOUS) ×3 IMPLANT
STAPLE RELOAD 45 GRN (STAPLE) ×2 IMPLANT
STAPLE RELOAD 45MM GREEN (STAPLE) ×2
SUT ETHILON 3 0 PS 1 (SUTURE) ×3 IMPLANT
SUT MNCRL 3 0 RB1 (SUTURE) ×3 IMPLANT
SUT MNCRL 3 0 VIOLET RB1 (SUTURE) ×3 IMPLANT
SUT MNCRL AB 4-0 PS2 18 (SUTURE) ×6 IMPLANT
SUT PDS PLUS AB 0 CT-2 (SUTURE) ×6 IMPLANT
SUT VIC AB 0 CT1 27 (SUTURE) ×4
SUT VIC AB 0 CT1 27XBRD ANTBC (SUTURE) ×6 IMPLANT
SUT VIC AB 2-0 SH 27 (SUTURE) ×2
SUT VIC AB 2-0 SH 27X BRD (SUTURE) ×3 IMPLANT
SYR 27GX1/2 1ML LL SAFETY (SYRINGE) ×3 IMPLANT
TOWEL OR NON WOVEN STRL DISP B (DISPOSABLE) ×3 IMPLANT
TROCAR Z THREAD OPTICAL 12X100 (TROCAR) IMPLANT
TROCAR Z-THREAD FIOS 5X100MM (TROCAR) IMPLANT
WATER STERILE IRR 1000ML POUR (IV SOLUTION) ×3 IMPLANT

## 2023-04-22 NOTE — Transfer of Care (Signed)
Immediate Anesthesia Transfer of Care Note  Patient: Matthew Schmitt  Procedure(s) Performed: XI ROBOTIC ASSISTED LAPAROSCOPIC RADICAL PROSTATECTOMY LEVEL 2 (Abdomen) BILATERAL PELVIC LYMPHADENECTOMY (Bilateral: Abdomen)  Patient Location: PACU  Anesthesia Type:General  Level of Consciousness: awake, alert , and patient cooperative  Airway & Oxygen Therapy: Patient Spontanous Breathing and Patient connected to face mask oxygen  Post-op Assessment: Report given to RN and Post -op Vital signs reviewed and stable  Post vital signs: Reviewed and stable  Last Vitals:  Vitals Value Taken Time  BP 178/72 04/22/23 1445  Temp    Pulse 68 04/22/23 1446  Resp 13 04/22/23 1446  SpO2 98 % 04/22/23 1446  Vitals shown include unvalidated device data.  Last Pain:  Vitals:   04/22/23 0917  TempSrc: Oral  PainSc:          Complications: No notable events documented.

## 2023-04-22 NOTE — Op Note (Signed)
Preoperative diagnosis: Clinically localized adenocarcinoma of the prostate (clinical stage T1c N0 M0)  Postoperative diagnosis: Clinically localized adenocarcinoma of the prostate (clinical stage T1c N0 M0)  Procedure:  Robotic assisted laparoscopic radical prostatectomy (non nerve sparing) Bilateral robotic assisted laparoscopic pelvic lymphadenectomy  Surgeon: [name] M.D.  Assistant(s): Harrie Foreman, PA-C  An assistant was required for this surgical procedure.  The duties of the assistant included but were not limited to suctioning, passing suture, camera manipulation, retraction. This procedure would not be able to be performed without an Geophysicist/field seismologist.   Resident: Dr. Michaele Offer  Anesthesia: General  Complications: None  EBL: 100 mL  IVF:  1700 mL crystalloid  Specimens: Prostate and seminal vesicles Right pelvic lymph nodes Left pelvic lymph nodes  Disposition of specimens: Pathology  Drains: 20 Fr coude catheter # 19 Blake pelvic drain  Indication: Matthew Schmitt is a 72 y.o. year old patient with clinically localized prostate cancer.  After a thorough review of the management options for treatment of prostate cancer, he elected to proceed with surgical therapy and the above procedure(s).  We have discussed the potential benefits and risks of the procedure, side effects of the proposed treatment, the likelihood of the patient achieving the goals of the procedure, and any potential problems that might occur during the procedure or recuperation. Informed consent has been obtained.  Description of procedure:  The patient was taken to the operating room and a general anesthetic was administered. He was given preoperative antibiotics, placed in the dorsal lithotomy position, and prepped and draped in the usual sterile fashion. Next a preoperative timeout was performed. A urethral catheter was placed into the bladder and a site was selected near the umbilicus  for placement of the camera port. This was placed using a standard open Hassan technique which allowed entry into the peritoneal cavity under direct vision and without difficulty. A 12 mm port was placed and a pneumoperitoneum established. The camera was then used to inspect the abdomen and there was no evidence of any intra-abdominal injuries or other abnormalities. The remaining abdominal ports were then placed. 8 mm robotic ports were placed in the right lower quadrant, left lower quadrant, and far left lateral abdominal wall. A 5 mm port was placed in the right upper quadrant and a 12 mm port was placed in the right lateral abdominal wall for laparoscopic assistance. All ports were placed under direct vision without difficulty. The surgical cart was then docked.   Utilizing the cautery scissors, the bladder was reflected posteriorly allowing entry into the space of Retzius and identification of the endopelvic fascia and prostate. The periprostatic fat was then removed from the prostate allowing full exposure of the endopelvic fascia. The endopelvic fascia was then incised from the apex back to the base of the prostate bilaterally and the underlying levator muscle fibers were swept laterally off the prostate thereby isolating the dorsal venous complex. The dorsal vein was then stapled and divided with a 45 mm Flex Echelon stapler. Attention then turned to the bladder neck which was divided anteriorly thereby allowing entry into the bladder and exposure of the urethral catheter. The catheter balloon was deflated and the catheter was brought into the operative field and used to retract the prostate anteriorly. The posterior bladder neck was then examined and was divided allowing further dissection between the bladder and prostate posteriorly until the vasa deferentia and seminal vessels were identified. The vasa deferentia were isolated, divided, and lifted anteriorly. The seminal  vesicles were dissected down to  their tips with care to control the seminal vascular arterial blood supply. These structures were then lifted anteriorly and the space between Denonvillier's fascia and the anterior rectum was developed with a combination of sharp and blunt dissection. This isolated the vascular pedicles of the prostate.  A wide non nerve sparing dissection was performed with Weck clips used to ligate the vascular pedicles of the prostate bilaterally. The vascular pedicles of the prostate were then divided.  The urethra was then sharply transected allowing the prostate specimen to be disarticulated. The pelvis was copiously irrigated and hemostasis was ensured. There was no evidence for rectal injury.  Attention then turned to the right pelvic sidewall. The fibrofatty tissue between the external iliac vein, confluence of the iliac vessels, hypogastric artery, and Cooper's ligament was dissected free from the pelvic sidewall with care to preserve the obturator nerve. Weck clips were used for lymphostasis and hemostasis. An identical procedure was performed on the contralateral side and the lymphatic packets were removed for permanent pathologic analysis.  Attention then turned to the urethral anastomosis. A 2-0 Vicryl slip knot was placed between Denonvillier's fascia, the posterior bladder neck, and the posterior urethra to reapproximate these structures. A double-armed 3-0 Monocryl suture was then used to perform a 360 running tension-free anastomosis between the bladder neck and urethra. A new urethral catheter was then placed into the bladder and irrigated. There were no blood clots within the bladder and the anastomosis appeared to be watertight. A #19 Blake drain was then brought through the left lateral 8 mm port site and positioned appropriately within the pelvis. It was secured to the skin with a nylon suture. The surgical cart was then undocked. The right lateral 12 mm port site was closed at the fascial level with  a 0 Vicryl suture placed laparoscopically. All remaining ports were then removed under direct vision. The prostate specimen was removed intact within the Endopouch retrieval bag via the periumbilical camera port site. This fascial opening was closed with two running 0 Vicryl sutures. 0.25% Marcaine was then injected into all port sites and all incisions were reapproximated at the skin level with 3-0 Monocryl subcuticular sutures. Dermabond was applied to the skin. The patient appeared to tolerate the procedure well and without complications. The patient was able to be extubated and transferred to the recovery unit in satisfactory condition.  Moody Bruins MD

## 2023-04-22 NOTE — Discharge Instructions (Signed)
Activity:  You are encouraged to ambulate frequently (about every hour during waking hours) to help prevent blood clots from forming in your legs or lungs.  However, you should not engage in any heavy lifting (> 10-15 lbs), strenuous activity, or straining. Diet: You should continue a clear liquid diet until passing gas from below.  Once this occurs, you may advance your diet to a soft diet that would be easy to digest (i.e soups, scrambled eggs, mashed potatoes, etc.) for 24 hours just as you would if getting over a bad stomach flu.  If tolerating this diet well for 24 hours, you may then begin eating regular food.  It will be normal to have some amount of bloating, nausea, and abdominal discomfort intermittently. Prescriptions:  You will be provided a prescription for pain medication to take as needed.  If your pain is not severe enough to require the prescription pain medication, you may take Tylenol instead.  You should also take an over the counter stool softener (Colace 100 mg twice daily) to avoid straining with bowel movements as the pain medication may constipate you. Finally, you will also be provided a prescription for an antibiotic to begin the day prior to your return visit in the office for catheter removal. Catheter care: You will be taught how to take care of the catheter by the nursing staff prior to discharge from the hospital.  You may use both a leg bag and the larger bedside bag but it is recommended to at least use the bigger bedside bag at nighttime as the leg bag is small and will fill up overnight and also does not drain as well when lying flat. You may periodically feel a strong urge to void with the catheter in place.  This is a bladder spasm and most often can occur when having a bowel movement or when you are moving around. It is typically self-limited and usually will stop after a few minutes.  You may use some Vaseline or Neosporin around the tip of the catheter to reduce friction  at the tip of the penis. Incisions: You may remove your dressing bandages the 2nd day after surgery.  You most likely will have a few small staples in each of the incisions and once the bandages are removed, the incisions may stay open to air.  You may start showering (not soaking or bathing in water) 48 hours after surgery and the incisions simply need to be patted dry after the shower.  No additional care is needed. What to call us about: You should call the office ([phone]) if you develop fever > 101, persistent vomiting, or the catheter stops draining. Also, feel free to call with any other questions you may have and remember the handout that was provided to you as a reference preoperatively which answers many of the common questions that arise after surgery. You may resume aspirin, advil, aleve, vitamins, and supplements 7 days after surgery.  

## 2023-04-22 NOTE — Interval H&P Note (Signed)
History and Physical Interval Note:  04/22/2023 10:18 AM  Matthew Schmitt  has presented today for surgery, with the diagnosis of PROSTATE CANCER.  The various methods of treatment have been discussed with the patient and family. After consideration of risks, benefits and other options for treatment, the patient has consented to  Procedure(s): XI ROBOTIC ASSISTED LAPAROSCOPIC RADICAL PROSTATECTOMY LEVEL 2 (N/A) BILATERAL PELVIC LYMPHADENECTOMY (Bilateral) as a surgical intervention.  The patient's history has been reviewed, patient examined, no change in status, stable for surgery.  I have reviewed the patient's chart and labs.  Questions were answered to the patient's satisfaction.     [employer]

## 2023-04-22 NOTE — Anesthesia Postprocedure Evaluation (Addendum)
Anesthesia Post Note  Patient: Matthew Schmitt  Procedure(s) Performed: XI ROBOTIC ASSISTED LAPAROSCOPIC RADICAL PROSTATECTOMY LEVEL 2 (Abdomen) BILATERAL PELVIC LYMPHADENECTOMY (Bilateral: Abdomen)     Patient location during evaluation: PACU Anesthesia Type: General Level of consciousness: awake Pain management: pain level controlled Vital Signs Assessment: post-procedure vital signs reviewed and stable Respiratory status: spontaneous breathing, nonlabored ventilation and respiratory function stable Cardiovascular status: blood pressure returned to baseline and stable Postop Assessment: no apparent nausea or vomiting Anesthetic complications: no Comments: Patient had T-wave inversions seen on monitor in PACU. 12-lead ECG without T-wave inversions. It does show afib with slow RVR. Patient has h/o of PAF but had ablation in 2022 with no recurrence. Asymptomatic. Notified Dr. Laverle Patter.  No notable events documented.  Last Vitals:  Vitals:   04/22/23 1605 04/22/23 1610  BP:    Pulse: (!) 29 (!) 138  Resp: 10 11  Temp: 36.7 C   SpO2: 94% 93%    Last Pain:  Vitals:   04/22/23 1605  TempSrc:   PainSc: Asleep                 Linton Rump

## 2023-04-22 NOTE — Progress Notes (Signed)
Pacu RN Report to floor given  Gave report to  Banker. Room: 1402   Discussed surgery, meds given in OR and Pacu, VS, IV fluids given, EBL, urine output, pain and other pertinent information. Also discussed if pt had any family or friends here or belongings with them.    Pt has 5 lapsites w/ dermabond, 1 Jp site, emptied bloody drainage 70 ml. Pt having a lot of discomfort with the catheter and the manipulation with the procedure. Explained this in a normal sensation. I did give him 100 mcg of Fentanyl with a little relief, not much. We did attempt to sit at side of bed and stand which did not help either. He verbalized understanding just agitation with the discomfort.   I have sent a text update to his wife.   Pt exits my care.

## 2023-04-22 NOTE — Anesthesia Procedure Notes (Signed)
Procedure Name: Intubation Date/Time: 04/22/2023 11:16 AM  Performed by: Sindy Guadeloupe, CRNAPre-anesthesia Checklist: Patient identified, Emergency Drugs available, Suction available, Patient being monitored and Timeout performed Patient Re-evaluated:Patient Re-evaluated prior to induction Oxygen Delivery Method: Circle system utilized Preoxygenation: Pre-oxygenation with 100% oxygen Induction Type: IV induction Ventilation: Mask ventilation without difficulty and Oral airway inserted - appropriate to patient size Laryngoscope Size: Mac and 4 Grade View: Grade II Tube type: Oral Tube size: 7.5 mm Number of attempts: 1 Airway Equipment and Method: Stylet Placement Confirmation: ETT inserted through vocal cords under direct vision, positive ETCO2 and breath sounds checked- equal and bilateral Secured at: 23 cm Tube secured with: Tape Dental Injury: Teeth and Oropharynx as per pre-operative assessment

## 2023-04-22 NOTE — Addendum Note (Signed)
Addendum  created 04/22/23 2022 by Linton Rump, MD   Clinical Note Signed

## 2023-04-22 NOTE — Progress Notes (Signed)
Patient ID: JESHAUN JEANPIERRE, male   DOB: 29-Oct-1951, 72 y.o.   MRN: [id number]  Post-op note  Subjective: The patient is doing well.  No complaints.  Objective: Vital signs in last 24 hours: Temp:  [97.5 F (36.4 C)-98.1 F (36.7 C)] 97.5 F (36.4 C) (06/03 1635) Pulse Rate:  [29-138] 57 (06/03 1635) Resp:  [10-20] 15 (06/03 1635) BP: (154-178)/(61-134) 176/69 (06/03 1635) SpO2:  [93 %-99 %] 95 % (06/03 1635) Weight:  [102.1 kg-103 kg] 102.1 kg (06/03 1628)  Intake/Output from previous day: No intake/output data recorded. Intake/Output this shift: Total I/O In: 2173.2 [I.V.:2073.2; IV Piggyback:100] Out: 1050 [Urine:850; Drains:100; Blood:100]  Physical Exam:  General: Alert and oriented. Abdomen: Soft, Nondistended. Incisions: Clean and dry. GU: Urine clear  Lab Results: Recent Labs    04/22/23 1516  HGB 15.1  HCT 47.0    Assessment/Plan: POD#0   1) Continue to monitor, ambulate, IS   [name] MD   LOS: 0 days   [name] 04/22/2023, 5:05 PM

## 2023-04-23 ENCOUNTER — Encounter (HOSPITAL_COMMUNITY): Payer: Self-pay | Admitting: Urology

## 2023-04-23 DIAGNOSIS — C61 Malignant neoplasm of prostate: Secondary | ICD-10-CM | POA: Diagnosis not present

## 2023-04-23 LAB — HEMOGLOBIN AND HEMATOCRIT, BLOOD
HCT: 46.7 % (ref 39.0–52.0)
Hemoglobin: 15 g/dL (ref 13.0–17.0)

## 2023-04-23 LAB — GLUCOSE, CAPILLARY
Glucose-Capillary: 146 mg/dL — ABNORMAL HIGH (ref 70–99)
Glucose-Capillary: 115 mg/dL — ABNORMAL HIGH (ref 70–99)
Glucose-Capillary: 187 mg/dL — ABNORMAL HIGH (ref 70–99)

## 2023-04-23 MED ORDER — BISACODYL 10 MG RE SUPP
10.0000 mg | Freq: Once | RECTAL | Status: AC
  Administered 2023-04-23: 10 mg via RECTAL
  Filled 2023-04-23: qty 1

## 2023-04-23 MED ORDER — CHLORHEXIDINE GLUCONATE CLOTH 2 % EX PADS
6.0000 | MEDICATED_PAD | Freq: Every day | CUTANEOUS | Status: DC
  Administered 2023-04-23: 6 via TOPICAL

## 2023-04-23 MED ORDER — TRAMADOL HCL 50 MG PO TABS
50.0000 mg | ORAL_TABLET | Freq: Four times a day (QID) | ORAL | Status: DC | PRN
  Administered 2023-04-23: 50 mg via ORAL
  Filled 2023-04-23: qty 1

## 2023-04-23 NOTE — Progress Notes (Signed)
Patient to be discharged to home today. All Discharge teaching/instructions reviewed with the Patient including foley and leg bag teaching. All discharge Medications and schedules for these Medications reviewed with the Patient. Patient verbalized understanding of all discharge instructions.. Discharge AVS with the Patient at time of discharge

## 2023-04-23 NOTE — TOC Transition Note (Signed)
Transition of Care [facility]) - CM/SW Discharge Note   Patient Details  Name: Matthew Schmitt MRN: [id number] Date of Birth: 08/13/1951  [facility] CM/SW Contact:  Lanier Clam, RN Phone Number: 04/23/2023, 9:58 AM   Clinical Narrative:  d/c home No orders or CM needsl.     Final next level of care: Home/Self Care Barriers to Discharge: No Barriers Identified   Patient Goals and CMS Choice      Discharge Placement                         Discharge Plan and Services Additional resources added to the After Visit Summary for                                       Social Determinants of Health (SDOH) Interventions SDOH Screenings   Food Insecurity: No Food Insecurity (04/22/2023)  Housing: Low Risk  (04/22/2023)  Transportation Needs: No Transportation Needs (04/22/2023)  Utilities: Not At Risk (04/22/2023)  Tobacco Use: Medium Risk (04/23/2023)     Readmission Risk Interventions     No data to display

## 2023-04-23 NOTE — Progress Notes (Signed)
Walker has been delivered to the bedside.

## 2023-04-23 NOTE — Progress Notes (Signed)
Patient ID: Matthew Schmitt, male   DOB: [birth year], 72 y.o.   MRN: [id number]  1 Day Post-Op Subjective: The patient is doing well.  No nausea or vomiting. Pain is adequately controlled.  Episode of rate controlled atrial fibrillation postoperatively.  No tachycardia overnight.  Objective: Vital signs in last 24 hours: Temp:  [97.4 F (36.3 C)-98.1 F (36.7 C)] 97.7 F (36.5 C) (06/04 0357) Pulse Rate:  [29-138] 66 (06/04 0357) Resp:  [10-20] 15 (06/04 0357) BP: (142-178)/(61-134) 147/70 (06/04 0357) SpO2:  [93 %-99 %] 98 % (06/04 0357) Weight:  [102.1 kg-103 kg] 102.1 kg (06/03 1628)  Intake/Output from previous day: 06/03 0701 - 06/04 0700 In: 5086 [P.O.:480; I.V.:3406; IV Piggyback:1200] Out: 2055 [Urine:1550; Drains:405; Blood:100] Intake/Output this shift: No intake/output data recorded.  Physical Exam:  General: Alert and oriented. CV: NSR Lungs: Clear bilaterally. GI: Soft, Nondistended. Incisions: Clean, dry, and intact Urine: Clear Extremities: Nontender, no erythema, no edema.  Lab Results: Recent Labs    04/22/23 1516 04/23/23 0454  HGB 15.1 15.0  HCT 47.0 46.7      Assessment/Plan: POD# 1 s/p robotic prostatectomy.  1) SL IVF 2) Ambulate, Incentive spirometry 3) Transition to oral pain medication 4) Dulcolax suppository 5) D/C pelvic drain 6) He is back in NSR this morning.  I will message Dr. Anne Fu to ensure he would not want to see him sooner than his next scheduled appt. 7) Plan for likely discharge later today   Matthew Schmitt. MD   LOS: 0 days   [name] 04/23/2023, 7:31 AM

## 2023-04-23 NOTE — TOC Transition Note (Addendum)
Transition of Care [facility]) - CM/SW Discharge Note   Patient Details  Name: Matthew Schmitt MRN: [id number] Date of Birth: 1951-08-23  Transition of Care [facility]) CM/SW Contact:  Lanier Clam, RN Phone Number: 04/23/2023, 11:34 AM   Clinical Narrative:Received referral for patient wanting rw. Patient prefers a rw-informed nsg to contact attending to order. Patient w/no preference for dme company-[employer] rep Jermaine to deliver rw to rm once order placed prior d/c. No further CM needs.     Final next level of care: Home/Self Care Barriers to Discharge: No Barriers Identified   Patient Goals and CMS Choice      Discharge Placement                         Discharge Plan and Services Additional resources added to the After Visit Summary for                                       Social Determinants of Health (SDOH) Interventions SDOH Screenings   Food Insecurity: No Food Insecurity (04/22/2023)  Housing: Low Risk  (04/22/2023)  Transportation Needs: No Transportation Needs (04/22/2023)  Utilities: Not At Risk (04/22/2023)  Tobacco Use: Medium Risk (04/23/2023)     Readmission Risk Interventions     No data to display

## 2023-04-23 NOTE — Progress Notes (Signed)
Mobility Specialist - Progress Note   04/23/23 0930  Mobility  Activity Ambulated with assistance in hallway  Level of Assistance Modified independent, requires aide device or extra time  Assistive Device Front wheel walker  Distance Ambulated (ft) 500 ft  Range of Motion/Exercises Active  Activity Response Tolerated well  Mobility Referral Yes  $Mobility charge 1 Mobility  Mobility Specialist Start Time (ACUTE ONLY) 0920  Mobility Specialist Stop Time (ACUTE ONLY) 0930  Mobility Specialist Time Calculation (min) (ACUTE ONLY) 10 min   Pt was found on recliner chair and agreeable to ambulate. Stated being sore on incision site. No other complaints during session and at EOS returned to recliner chair with all necessities in reach.  Billey Chang Mobility Specialist

## 2023-04-23 NOTE — Discharge Summary (Signed)
Date of admission: 04/22/2023  Date of discharge: 04/23/2023  Admission diagnosis: Prostate Cancer  Discharge diagnosis: Prostate Cancer  History and Physical: For full details, please see admission history and physical. Briefly, Matthew Schmitt is a 72 y.o. gentleman with localized prostate cancer.  After discussing management/treatment options, he elected to proceed with surgical treatment.  Hospital Course: Matthew Schmitt was taken to the operating room on 04/22/2023 and underwent a robotic assisted laparoscopic radical prostatectomy. He tolerated this procedure well and without complications. Postoperatively, he was able to be transferred to a regular hospital room following recovery from anesthesia.  He was able to begin ambulating the night of surgery. He remained hemodynamically stable overnight.  He had excellent urine output with appropriately minimal output from his pelvic drain and his pelvic drain was removed on POD #1.  He was transitioned to oral pain medication, tolerated a clear liquid diet, and had met all discharge criteria and was able to be discharged home later on POD#1.  Laboratory values:  Recent Labs    04/22/23 1516 04/23/23 0454  HGB 15.1 15.0  HCT 47.0 46.7    Disposition: Home  Discharge instruction: He was instructed to be ambulatory but to refrain from heavy lifting, strenuous activity, or driving. He was instructed on urethral catheter care.  Discharge medications:   Allergies as of 04/23/2023       Reactions   Actos [pioglitazone] Swelling, Cough   SWELLING REACTION UNSPECIFIED  EDEMA        Medication List     TAKE these medications    dapagliflozin propanediol 10 MG Tabs tablet Commonly known as: Farxiga Take 1 tablet (10 mg total) by mouth daily before breakfast.   docusate sodium 100 MG capsule Commonly known as: COLACE Take 1 capsule (100 mg total) by mouth 2 (two) times daily.   methocarbamol 750 MG tablet Commonly known as:  ROBAXIN Take 1 tablet (750 mg total) by mouth 2 (two) times daily as needed for muscle spasms.   metoprolol succinate 50 MG 24 hr tablet Commonly known as: TOPROL-XL TAKE 1 TABLET DAILY, TAKE WITH OR IMMEDIATELY FOLLOWING A MEAL   nitroGLYCERIN 0.4 MG SL tablet Commonly known as: NITROSTAT Place 0.4 mg under the tongue every 5 (five) minutes x 3 doses as needed for chest pain.   OneTouch Delica Plus Lancet33G Misc 1 each by Other route 2 (two) times daily. E11.9   OneTouch Ultra test strip Generic drug: glucose blood USE 1 STRIP TWICE A DAY   Pen Needles 30G X 8 MM Misc 1 each by Does not apply route daily. E11.9   rosuvastatin 20 MG tablet Commonly known as: CRESTOR TAKE 1 TABLET DAILY   sacubitril-valsartan 97-103 MG Commonly known as: ENTRESTO Take 1 tablet by mouth 2 (two) times daily. What changed:  how much to take when to take this   Semaglutide(0.25 or 0.5MG /DOS) 2 MG/3ML Sopn Inject 0.5 mg into the skin once a week. What changed: when to take this   spironolactone 25 MG tablet Commonly known as: ALDACTONE TAKE ONE-HALF (1/2) TABLET DAILY   sulfamethoxazole-trimethoprim 800-160 MG tablet Commonly known as: BACTRIM DS Take 1 tablet by mouth 2 (two) times daily. Start the day prior to foley removal appointment   traMADol 50 MG tablet Commonly known as: Ultram Take 1-2 tablets (50-100 mg total) by mouth every 6 (six) hours as needed for moderate pain or severe pain.   Evaristo Bury FlexTouch 200 UNIT/ML FlexTouch Pen Generic drug: insulin degludec Inject 90  Units into the skin daily.   trolamine salicylate 10 % cream Commonly known as: ASPERCREME Apply 1 application  topically 3 (three) times daily as needed for muscle pain.        Followup: He will followup in 1 week for catheter removal and to discuss his surgical pathology results.

## 2023-04-24 ENCOUNTER — Telehealth: Payer: Self-pay | Admitting: *Deleted

## 2023-04-24 DIAGNOSIS — I4891 Unspecified atrial fibrillation: Secondary | ICD-10-CM

## 2023-04-24 DIAGNOSIS — I480 Paroxysmal atrial fibrillation: Secondary | ICD-10-CM

## 2023-04-24 NOTE — Telephone Encounter (Signed)
-----   Message from Jake Bathe, MD sent at 04/24/2023  6:29 AM EDT ----- Let's get him in with afib clinic in 1-2 weeks Thanks Donato Schultz, MD  ----- Message ----- From: Heloise Purpura, MD Sent: 04/23/2023   7:36 AM EDT To: Jake Bathe, MD  Loraine Leriche,  I did a prostatectomy on Rolly Salter yesterday.  He did fine overall.  He did go back into a fib postoperatively briefly (ECG done by Dr. Isaias Cowman with anesthesia post op) but was always rate controlled and hemodynamically stable. He is back in NSR this morning.  I just didn't know if you may want him seen sooner to follow up.  If he needed to be anticoagulated, it would be best to wait 5-7 days anyway after his surgery.  Thanks for your input!  Les

## 2023-04-24 NOTE — Telephone Encounter (Signed)
Left the pt a message to call the office back to inform him that we will be referring him to our afib clinic to be seen in 1-2 weeks, per Dr. Anne Fu, for post-op afib he experienced after his procedure yesterday with Dr. Laverle Patter.   Will go ahead and place the referral to afib clinic in the system and send this message to Dr. Anne Fu RN for further follow-up of this.   Will send a staff message to our afib clinic about referral placed and to call the pt back to arrange this appt in 1-2 weeks, as advised by Dr. Anne Fu. Will also cc our [facility] pool on this as well.

## 2023-04-25 LAB — SURGICAL PATHOLOGY

## 2023-04-25 NOTE — Telephone Encounter (Signed)
Appointment scheduled 05/17/23.

## 2023-04-27 ENCOUNTER — Emergency Department (HOSPITAL_COMMUNITY)
Admission: EM | Admit: 2023-04-27 | Discharge: 2023-04-28 | Disposition: A | Discharge status: Home / Self Care | Payer: BC Managed Care – PPO | Attending: Emergency Medicine | Admitting: Emergency Medicine

## 2023-04-27 ENCOUNTER — Other Ambulatory Visit: Payer: Self-pay

## 2023-04-27 ENCOUNTER — Encounter (HOSPITAL_COMMUNITY): Payer: Self-pay

## 2023-04-27 DIAGNOSIS — Z79899 Other long term (current) drug therapy: Secondary | ICD-10-CM | POA: Diagnosis not present

## 2023-04-27 DIAGNOSIS — N3289 Other specified disorders of bladder: Secondary | ICD-10-CM | POA: Diagnosis not present

## 2023-04-27 DIAGNOSIS — Z794 Long term (current) use of insulin: Secondary | ICD-10-CM | POA: Insufficient documentation

## 2023-04-27 DIAGNOSIS — R300 Dysuria: Secondary | ICD-10-CM | POA: Diagnosis present

## 2023-04-27 NOTE — ED Triage Notes (Signed)
Pt. Arrives POV stating that his catheter is blocked. Pt. Called the urology clinic and told him to come here to have it flushed. Pt. Denies pain, but states that it is starting to become uncomfortable.

## 2023-04-28 LAB — URINALYSIS, ROUTINE W REFLEX MICROSCOPIC
Bacteria, UA: NONE SEEN
Bilirubin Urine: NEGATIVE
Glucose, UA: >=500 mg/dL — AB
Ketones, ur: NEGATIVE mg/dL
Nitrite: NEGATIVE
Protein, ur: NEGATIVE mg/dL
RBC / HPF: >50 RBC/hpf (ref 0–5)
Specific Gravity, Urine: 1.009 (ref 1.005–1.030)
pH: 5 (ref 5.0–8.0)

## 2023-04-28 LAB — CBC WITH DIFFERENTIAL/PLATELET
Abs Immature Granulocytes: 0.02 10*3/uL (ref 0.00–0.07)
Basophils Absolute: 0 10*3/uL (ref 0.0–0.1)
Basophils Relative: 1 %
Eosinophils Absolute: 0.3 10*3/uL (ref 0.0–0.5)
Eosinophils Relative: 5 %
HCT: 41.9 % (ref 39.0–52.0)
Hemoglobin: 13.7 g/dL (ref 13.0–17.0)
Immature Granulocytes: 0 %
Lymphocytes Relative: 14 %
Lymphs Abs: 0.8 10*3/uL (ref 0.7–4.0)
MCH: 28.2 pg (ref 26.0–34.0)
MCHC: 32.7 g/dL (ref 30.0–36.0)
MCV: 86.4 fL (ref 80.0–100.0)
Monocytes Absolute: 0.5 10*3/uL (ref 0.1–1.0)
Monocytes Relative: 8 %
Neutro Abs: 4.1 10*3/uL (ref 1.7–7.7)
Neutrophils Relative %: 72 %
Platelets: 177 10*3/uL (ref 150–400)
RBC: 4.85 MIL/uL (ref 4.22–5.81)
RDW: 13.8 % (ref 11.5–15.5)
WBC: 5.6 10*3/uL (ref 4.0–10.5)
nRBC: 0 % (ref 0.0–0.2)

## 2023-04-28 LAB — BASIC METABOLIC PANEL
Anion gap: 8 (ref 5–15)
BUN: 27 mg/dL — ABNORMAL HIGH (ref 8–23)
CO2: 23 mmol/L (ref 22–32)
Calcium: 8.7 mg/dL — ABNORMAL LOW (ref 8.9–10.3)
Chloride: 106 mmol/L (ref 98–111)
Creatinine, Ser: 0.88 mg/dL (ref 0.61–1.24)
GFR, Estimated: >60 mL/min (ref >60)
Glucose, Bld: 197 mg/dL — ABNORMAL HIGH (ref 70–99)
Potassium: 3.7 mmol/L (ref 3.5–5.1)
Sodium: 137 mmol/L (ref 135–145)

## 2023-04-28 MED ORDER — LIDOCAINE HCL URETHRAL/MUCOSAL 2 % EX GEL
1.0000 | Freq: Once | CUTANEOUS | Status: AC
  Administered 2023-04-28: 1 via URETHRAL
  Filled 2023-04-28: qty 11

## 2023-04-28 MED ORDER — OXYBUTYNIN CHLORIDE 5 MG PO TABS
5.0000 mg | ORAL_TABLET | Freq: Once | ORAL | Status: AC
  Administered 2023-04-28: 5 mg via ORAL
  Filled 2023-04-28: qty 1

## 2023-04-28 MED ORDER — OXYBUTYNIN CHLORIDE 5 MG PO TABS: 5.0000 mg | ORAL_TABLET | Freq: Two times a day (BID) | ORAL | 0 refills | Status: DC

## 2023-04-28 NOTE — ED Provider Notes (Signed)
[place] EMERGENCY DEPARTMENT AT [facility] Provider Note   CSN: [id number] Arrival date & time: 04/27/23  2301     History  Chief Complaint  Patient presents with   Dysuria    Matthew Schmitt is a 72 y.o. male.  The history is provided by the patient and medical records.  Dysuria Presenting symptoms: dysuria   Matthew Schmitt is a 72 y.o. male who presents to the Emergency Department complaining of difficulty urine eating.  He is status post prostatectomy on Monday and left with a 18 French Foley catheter in place.  He states that he has had leakage of urine around the catheter since the surgery and today he had a blockage of the catheter this morning.  He was able to follow-up and relieve the blockage initially but then he was unable to have any drainage from the catheter with this attempt this evening and he presented for evaluation.  He did drink half a gallon of water prior to presenting to the ED.  He states that at time of arrival he started to drain again.  No fevers, nausea, vomiting.  He does not take any blood thinners.     Home Medications Prior to Admission medications   Medication Sig Start Date End Date Taking? Authorizing Provider  oxybutynin (DITROPAN) 5 MG tablet Take 1 tablet (5 mg total) by mouth 2 (two) times daily. 04/28/23  Yes Tilden Fossa, MD  dapagliflozin propanediol (FARXIGA) 10 MG TABS tablet Take 1 tablet (10 mg total) by mouth daily before breakfast. 01/30/23   Carlus Pavlov, MD  docusate sodium (COLACE) 100 MG capsule Take 1 capsule (100 mg total) by mouth 2 (two) times daily. 04/22/23   Harrie Foreman, PA-C  insulin degludec (TRESIBA FLEXTOUCH) 200 UNIT/ML FlexTouch Pen Inject 90 Units into the skin daily. 11/15/22   Carlus Pavlov, MD  Insulin Pen Needle (PEN NEEDLES) 30G X 8 MM MISC 1 each by Does not apply route daily. E11.9 07/07/20   Romero Belling, MD  Lancets [facility] DELICA PLUS [place]) MISC 1 each by Other route 2 (two) times  daily. E11.9 07/07/20   Romero Belling, MD  methocarbamol (ROBAXIN) 750 MG tablet Take 1 tablet (750 mg total) by mouth 2 (two) times daily as needed for muscle spasms. Patient not taking: Reported on 04/08/2023 07/17/22   Cristie Hem, PA-C  metoprolol succinate (TOPROL-XL) 50 MG 24 hr tablet TAKE 1 TABLET DAILY, TAKE WITH OR IMMEDIATELY FOLLOWING A MEAL 04/19/23   Jake Bathe, MD  nitroGLYCERIN (NITROSTAT) 0.4 MG SL tablet Place 0.4 mg under the tongue every 5 (five) minutes x 3 doses as needed for chest pain.    [provider]  [facility] ULTRA test strip USE 1 STRIP TWICE A DAY 03/30/22   Shamleffer, Konrad Dolores, MD  rosuvastatin (CRESTOR) 20 MG tablet TAKE 1 TABLET DAILY 04/19/23   Jake Bathe, MD  sacubitril-valsartan (ENTRESTO) 97-103 MG Take 1 tablet by mouth 2 (two) times daily. Patient taking differently: Take 2 tablets by mouth in the morning. 09/07/22   Sharlene Dory, PA-C  Semaglutide,0.25 or 0.5MG /DOS, 2 MG/3ML SOPN Inject 0.5 mg into the skin once a week. Patient taking differently: Inject 0.5 mg into the skin every Wednesday. 11/15/22   Carlus Pavlov, MD  spironolactone (ALDACTONE) 25 MG tablet TAKE ONE-HALF (1/2) TABLET DAILY 04/19/23   Jake Bathe, MD  sulfamethoxazole-trimethoprim (BACTRIM DS) 800-160 MG tablet Take 1 tablet by mouth 2 (two) times daily. Start the  day prior to foley removal appointment 04/22/23   Harrie Foreman, PA-C  traMADol (ULTRAM) 50 MG tablet Take 1-2 tablets (50-100 mg total) by mouth every 6 (six) hours as needed for moderate pain or severe pain. 04/22/23   Harrie Foreman, PA-C  trolamine salicylate (ASPERCREME) 10 % cream Apply 1 application  topically 3 (three) times daily as needed for muscle pain.    [provider]      Allergies    Actos [pioglitazone]    Review of Systems   Review of Systems  Genitourinary:  Positive for dysuria.  All other systems reviewed and are negative.   Physical Exam Updated Vital Signs BP  (!) 140/106   Pulse 68   Temp 98.6 F (37 C) (Oral)   Resp 17   SpO2 96%  Physical Exam Vitals and nursing note reviewed.  Constitutional:      Appearance: He is well-developed.  HENT:     Head: Normocephalic and atraumatic.  Cardiovascular:     Rate and Rhythm: Normal rate and regular rhythm.  Pulmonary:     Effort: Pulmonary effort is normal. No respiratory distress.  Abdominal:     Palpations: Abdomen is soft.     Tenderness: There is no abdominal tenderness. There is no guarding or rebound.     Comments: There is ecchymosis to the mid and lower abdominal wall with surgical sites that are clean, dry, intact.  Genitourinary:    Comments: 19 French Foley catheter in place with yellow urine in the bag. Musculoskeletal:        General: No tenderness.  Skin:    General: Skin is warm and dry.  Neurological:     Mental Status: He is alert and oriented to person, place, and time.  Psychiatric:        Behavior: Behavior normal.     ED Results / Procedures / Treatments   Labs (all labs ordered are listed, but only abnormal results are displayed) Labs Reviewed  BASIC METABOLIC PANEL - Abnormal; Notable for the following components:      Result Value   Glucose, Bld 197 (*)    BUN 27 (*)    Calcium 8.7 (*)    All other components within normal limits  URINALYSIS, ROUTINE W REFLEX MICROSCOPIC - Abnormal; Notable for the following components:   Color, Urine STRAW (*)    APPearance HAZY (*)    Glucose, UA >=500 (*)    Hgb urine dipstick LARGE (*)    Leukocytes,Ua TRACE (*)    All other components within normal limits  CBC WITH DIFFERENTIAL/PLATELET    EKG None  Radiology No results found.  Procedures Procedures    Medications Ordered in ED Medications  lidocaine (XYLOCAINE) 2 % jelly 1 Application (1 Application Urethral Given 04/28/23 0116)  oxybutynin (DITROPAN) tablet 5 mg (5 mg Oral Given 04/28/23 0239)    ED Course/ Medical Decision Making/ A&P                              Medical Decision Making Risk Prescription drug management.   Patient status post laparoscopic prostatectomy here for evaluation of difficulty draining his Foley catheter.  On examination he has yellow urine in the bag that did drain spontaneously.  He continues to have a sensation to void.  Bladder scan does not show any retained urine in the bladder.  Nursing staff did irrigate the Foley, no significant return of fluid.  Patient is not in distress.  Labs with stable renal function.  UA is not consistent with UTI.  Discussed with Dr. Ronne Binning with urology-plan to start on a medication for bladder spasm with follow-up in the office.  No evidence of Foley catheter obstruction at this time.        Final Clinical Impression(s) / ED Diagnoses Final diagnoses:  Bladder spasms    Rx / DC Orders ED Discharge Orders          Ordered    oxybutynin (DITROPAN) 5 MG tablet  2 times daily        04/28/23 0223              Tilden Fossa, MD 04/28/23 0330

## 2023-05-17 ENCOUNTER — Encounter (HOSPITAL_COMMUNITY): Payer: Self-pay | Admitting: Physician Assistant

## 2023-05-17 ENCOUNTER — Ambulatory Visit (HOSPITAL_COMMUNITY)
Admission: RE | Admit: 2023-05-17 | Discharge: 2023-05-17 | Disposition: A | Discharge status: Home / Self Care | Payer: BC Managed Care – PPO | Source: Ambulatory Visit | Attending: Physician Assistant | Admitting: Physician Assistant

## 2023-05-17 VITALS — BP 160/82 | HR 65 | Ht 71.0 in | Wt 226.6 lb

## 2023-05-17 DIAGNOSIS — I2510 Atherosclerotic heart disease of native coronary artery without angina pectoris: Secondary | ICD-10-CM | POA: Insufficient documentation

## 2023-05-17 DIAGNOSIS — I4510 Unspecified right bundle-branch block: Secondary | ICD-10-CM

## 2023-05-17 DIAGNOSIS — E119 Type 2 diabetes mellitus without complications: Secondary | ICD-10-CM | POA: Insufficient documentation

## 2023-05-17 DIAGNOSIS — I5022 Chronic systolic (congestive) heart failure: Secondary | ICD-10-CM | POA: Diagnosis not present

## 2023-05-17 DIAGNOSIS — I483 Typical atrial flutter: Secondary | ICD-10-CM | POA: Diagnosis present

## 2023-05-17 DIAGNOSIS — I110 Hypertensive heart disease with heart failure: Secondary | ICD-10-CM | POA: Insufficient documentation

## 2023-05-17 DIAGNOSIS — I480 Paroxysmal atrial fibrillation: Secondary | ICD-10-CM

## 2023-05-17 NOTE — Progress Notes (Signed)
Primary Care Physician: Ailene Ravel, MD Primary Cardiologist: Donato Schultz, MD Electrophysiologist: Lanier Prude, MD  Referring Physician: Dr Olene Floss is a 72 y.o. male with a history of typical atrial flutter, HFrEF, DM, CAD, HTN, bicuspid AV, prostate CA who presents for follow up in the [facility]. Patient is s/p flutter ablation 10/2021, associated with tachycardia mediated CM. He had no recurrence of his arrhythmia and his Xarelto was discontinued.   On follow up today, patient underwent prostatectomy on 04/22/23. Post operatively, he was noted to have an irregular beat and an ECG was done which was read as atrial fibrillation. He was asymptomatic at the time. He has been dealing with urinary incontinence since his surgery and has stopped most of his medications. Otherwise, he feels well today. He checks his pulse oximeter daily and has not noticed any elevated heart rates.   Today, he denies symptoms of palpitations, chest pain, shortness of breath, orthopnea, PND, lower extremity edema, dizziness, presyncope, syncope, snoring, daytime somnolence, bleeding, or neurologic sequela. The patient is tolerating medications without difficulties and is otherwise without complaint today.    Atrial Fibrillation Risk Factors:  he does not have symptoms or diagnosis of sleep apnea. he does not have a history of rheumatic fever.   Atrial Fibrillation Management history:  Previous antiarrhythmic drugs: amiodarone  Previous cardioversions: 04/02/13, 12/27/20 Previous ablations: 10/27/21 Anticoagulation history: Xarelto  ROS- All systems are reviewed and negative except as per the HPI above.   Physical Exam: BP (!) 160/82   Pulse 65   Ht 5\' 11"  (1.803 m)   Wt 102.8 kg   BMI 31.60 kg/m   GEN: Well nourished, well developed in no acute distress NECK: No JVD; No carotid bruits CARDIAC: Regular rate and rhythm, no murmurs, rubs,  gallops RESPIRATORY:  Clear to auscultation without rales, wheezing or rhonchi  ABDOMEN: Soft, non-tender, non-distended EXTREMITIES:  No edema; No deformity   Wt Readings from Last 3 Encounters:  05/17/23 102.8 kg  04/22/23 102.1 kg  04/10/23 103.9 kg     EKG today demonstrates  SR, 1st degree AV block, RBBB, LAFB Vent. rate 65 BPM PR interval 212 ms QRS duration 160 ms QT/QTcB 454/472 ms  Echo 02/19/23 demonstrated   1. Left ventricular ejection fraction, by estimation, is 45 to 50%. The  left ventricle has mildly decreased function. The left ventricle  demonstrates global hypokinesis. There is mild left ventricular  hypertrophy of the basal-septal segment. Left ventricular diastolic parameters are consistent with Grade II diastolic dysfunction (pseudonormalization). Elevated left atrial pressure.   2. Right ventricular systolic function is normal. The right ventricular  size is normal. Tricuspid regurgitation signal is inadequate for assessing  PA pressure.   3. Left atrial size was severely dilated.   4. The mitral valve is normal in structure. Mild mitral valve  regurgitation. No evidence of mitral stenosis.   5. The aortic valve has an indeterminant number of cusps. There is  moderate calcification of the aortic valve. There is moderate thickening  of the aortic valve. Aortic valve regurgitation is mild to moderate.  Moderate aortic valve stenosis. Aortic regurgitation PHT measures 524 msec. Aortic valve area, by VTI measures 1.62 cm. Aortic valve mean gradient measures 19.0 mmHg. Aortic valve Vmax measures 3.00 m/s. DVI 0.33.   6. The ascending aortic dimensions are 4.2cm and when indexed to BSA and gender, this is in the normal range.   7. The inferior  vena cava is normal in size with greater than 50%  respiratory variability, suggesting right atrial pressure of 3 mmHg.   8. Compared to study dated 12/22/30, no significant change in degree of AS.    CHA2DS2-VASc Score = 5   The patient's score is based upon: CHF History: 1 HTN History: 1 Diabetes History: 1 Stroke History: 0 Vascular Disease History: 1 Age Score: 1 Gender Score: 0   {Confirm score is correct.  If not, click here to update score.  REFRESH note.  :1}    ASSESSMENT AND PLAN: Typical atrial flutter S/p CTI ablation 10/2021 Now off anticoagulation with no recurrence of his arrhythmia ***  HTN Elevated today. I have asked him to restart Entresto and metoprolol.  Chronic HFrEF EF 45-50% Fluid status appears stable Currently off most of his GDMT due to urinary incontinence post prostate surgery. I have asked him to resume Entresto and metoprolol. He will stay off of Farxiga and spironolactone until his urinary incontinence resolves post prostatectomy.   CAD CAC score 1000 On statin No anginal symptoms    Follow up with EP APP per recall. AF clinic as needed.    Jorja Loa PA-C Afib Clinic [facility] [address] [place], [place] [address] [phone]

## 2023-06-11 NOTE — Progress Notes (Signed)
Patient had robotic prostatectomy on 6/3, and had post op apt on 6/11.   Patient is scheduled for his first post operative PSA check on 9/4.

## 2023-07-10 ENCOUNTER — Other Ambulatory Visit: Payer: Self-pay | Admitting: Internal Medicine

## 2023-07-11 ENCOUNTER — Ambulatory Visit (INDEPENDENT_AMBULATORY_CARE_PROVIDER_SITE_OTHER): Payer: BC Managed Care – PPO | Admitting: Internal Medicine

## 2023-07-11 ENCOUNTER — Encounter: Payer: Self-pay | Admitting: Internal Medicine

## 2023-07-11 VITALS — BP 140/80 | HR 70 | Ht 71.0 in | Wt 229.0 lb

## 2023-07-11 DIAGNOSIS — E785 Hyperlipidemia, unspecified: Secondary | ICD-10-CM | POA: Diagnosis not present

## 2023-07-11 DIAGNOSIS — E1165 Type 2 diabetes mellitus with hyperglycemia: Secondary | ICD-10-CM

## 2023-07-11 DIAGNOSIS — E119 Type 2 diabetes mellitus without complications: Secondary | ICD-10-CM

## 2023-07-11 DIAGNOSIS — E1159 Type 2 diabetes mellitus with other circulatory complications: Secondary | ICD-10-CM

## 2023-07-11 DIAGNOSIS — Z7985 Long-term (current) use of injectable non-insulin antidiabetic drugs: Secondary | ICD-10-CM | POA: Diagnosis not present

## 2023-07-11 DIAGNOSIS — Z7984 Long term (current) use of oral hypoglycemic drugs: Secondary | ICD-10-CM | POA: Diagnosis not present

## 2023-07-11 DIAGNOSIS — Z794 Long term (current) use of insulin: Secondary | ICD-10-CM

## 2023-07-11 LAB — POCT GLYCOSYLATED HEMOGLOBIN (HGB A1C): Hemoglobin A1C: 8.6 % — AB (ref 4.0–5.6)

## 2023-07-11 MED ORDER — INSULIN PEN NEEDLE 32G X 4 MM MISC: 3 refills | Status: DC

## 2023-07-11 MED ORDER — LYUMJEV KWIKPEN 200 UNIT/ML ~~LOC~~ SOPN: PEN_INJECTOR | SUBCUTANEOUS | 3 refills | Status: DC

## 2023-07-11 NOTE — Addendum Note (Signed)
Addended by: Pollie Meyer on: 07/11/2023 08:47 AM   Modules accepted: Orders

## 2023-07-11 NOTE — Progress Notes (Signed)
Patient ID: Matthew Schmitt, male   DOB: [birth year], 72 y.o.   MRN: [id number]  HPI: Matthew Schmitt is a 72 y.o.-year-old male, returning for follow-up for DM2, dx in 2002, insulin-dependent since 2017, uncontrolled, with complications (CAD, nonischemic cardiomyopathy, atrial flutter/PAF). Pt. previously saw Dr. Everardo All, but last visit with me 5 months ago.  Interim history: No increased urination, blurry vision, nausea, chest pain.  Since last visit, he was dx'ed with Pr CA. He had a robotic radical prostatectomy 04/22/2023.  He has bladder spasms and incontinence. He has a lot of stress, has irregular meal times.  She is not higher.  Reviewed HbA1c: Lab Results  Component Value Date   HGBA1C 7.6 (H) 04/10/2023   HGBA1C 7.6 (A) 01/30/2023   HGBA1C 8.7 (A) 11/15/2022   HGBA1C 7.4 (H) 06/13/2022   HGBA1C 7.9 (H) 05/04/2022   HGBA1C 8.6 (H) 04/06/2022   HGBA1C 8.5 (H) 03/06/2022   HGBA1C 8.0 (A) 01/05/2022   HGBA1C 6.4 (A) 08/04/2021   HGBA1C 6.4 (A) 03/24/2021   At last visit, he was on: - Farxiga 10 mg in am - Rybelsus 3 mg before b'fast - added 06/2022 >> stopped b/c no refills - Basaglar 90 units at bedtime He developed cough and edema from Actos. He tried Metformin >> gained weight.  He tried Trulicity before  - no GI sxs.  We changed to: - Tresiba 90 units daily - misses doses - Ozempic 0.25 >> 0.5 mg weekly >> $$$, would not want to restart, especially as he did not feel that this was helping him much - Farxiga 10 mg in am  Pt checks his sugars 1x a day and they are: - am: 92-260 (missed meds) >> 108, 126-290 >> 103-172, 222 (icecream) >> 140-200 - 2h after b'fast: n/c >> 155 >> n/c - before lunch: n/c - 2h after lunch: n/c - before dinner: n/c - 2h after dinner: n/c - bedtime: n/c - nighttime: n/c Lowest sugar was 22  - years ago; more recently: 92 >> 108 >> 103 >> 140 Highest sugar was 312 >> 290 >> 222 >> 200  Glucometer: One Touch ultra  - + Mild CKD, last  BUN/creatinine:  Lab Results  Component Value Date   BUN 27 (H) 04/28/2023   BUN 18 04/10/2023   CREATININE 0.88 04/28/2023   CREATININE 0.95 04/10/2023  04/24/2022: Glu 64, BUN/cr 21/1.11 Lab Results  Component Value Date   MICRALBCREAT 17.4 09/20/2014  On Entresto.  -+ HL; last set of lipids: Lab Results  Component Value Date   CHOL 114 09/07/2022   HDL 36 (L) 09/07/2022   LDLCALC 57 09/07/2022   TRIG 112 09/07/2022   CHOLHDL 3.2 09/07/2022  04/28/2022: 127/86/38/72 He is on Crestor 20 mg daily.  - last eye exam was on 04/10/2023. No DR..   - no numbness and tingling in his feet.  Last foot exam 11/15/2022.  He also has a history of nephrolithiasis, right bundle branch block. He also has atrial flutter.  ROS: + see HPI  Past Medical History:  Diagnosis Date   Anxiety    Arthritis    everywhere   Arthrosis of right acromioclavicular joint 01/29/2020   Atrial flutter [facility])    Onset May 2014 Cardioversion done    BPH associated with nocturia    BPH with obstruction/lower urinary tract symptoms 01/04/2017   CAD (coronary artery disease) 04/02/2013   Cath 2012 moderate mid LAD lesion, otherwise not much disease    Cancer (HCC)  Diabetes (HCC) 01/18/2018   Dilated aortic root (HCC)    aortic root 44 mm, ascending aorta 46 mm 12/22/21 echo   Encounter for long-term (current) use of other medications 07/27/2013   Essential hypertension, benign    First degree heart block    Heart murmur    mild-moderate AS and AR 01/11/22   History of bilateral knee replacement 02/07/2017   History of bladder stone    History of kidney stones    History of melanoma excision    June 2016  --- s/p  excision right  leg (per pt localized and no recurrence)   Hypertensive heart disease 04/02/2013   Impingement syndrome of right shoulder 01/29/2020   Impotence of organic origin    Left-sided low back pain with left-sided sciatica 01/23/2019   Long term current use of anticoagulant  therapy    Mixed hyperlipidemia    slightly   Multiple renal cysts    bilateral   Nephrolithiasis 05/17/2014   Nonischemic cardiomyopathy (HCC)    Osteoarthritis    PAF (paroxysmal atrial fibrillation) (HCC)    followed by cardiology  (takes ASA)   Peripheral vascular disease (HCC)    RBBB    RBBB (right bundle branch block with left anterior fascicular block) 04/02/2013   Renal oncocytoma of left kidney 06/18/2013   s/p left robotic assited laparoscopic partial nephrectomy 06/18/13   Right ureteral stone    Rotator cuff syndrome of left shoulder 04/17/2016   Status post total left knee replacement 01/01/2020   Tendinopathy of right biceps tendon 01/29/2020   Tendinopathy of right rotator cuff 01/29/2020   Type 2 diabetes mellitus treated with insulin [facility])    endocrinologist-  dr Everardo All   Wears glasses    Past Surgical History:  Procedure Laterality Date   A-FLUTTER ABLATION N/A 10/27/2021   Procedure: A-FLUTTER ABLATION;  Surgeon: Lanier Prude, MD;  Location: [facility] INVASIVE CV LAB;  Service: Cardiovascular;  Laterality: N/A;   CARDIAC CATHETERIZATION  08-17-2011  DR Karleen Hampshire TILLEY   POSSIBLE MODERATE LAD STENOSIS JUST AFTER THE BIFURCATION OF LARGE DIAGONAL BRANCH/ LVEF  40-45%/ MILD GLOBAL HYPODINESIS (CATH DONE FOR ABNORMAL STRESS TEST OF FIXED LATERAL WALL DEFECT & BIFASCICULAR BLAOCK)   CARDIOVERSION N/A 04/02/2013   Procedure: CARDIOVERSION;  Surgeon: Othella Boyer, MD;  Location: [facility] OR;  Service: Cardiovascular;  Laterality: N/A;   CARDIOVERSION N/A 12/27/2020   Procedure: CARDIOVERSION;  Surgeon: Chilton Si, MD;  Location: [facility] ENDOSCOPY;  Service: Cardiovascular;  Laterality: N/A;   CYSTOSCOPY WITH LITHOLAPAXY N/A 05/18/2015   Procedure: CYSTOSCOPY WITH LITHOLAPAXY;  Surgeon: Barron Alvine, MD;  Location: [facility];  Service: Urology;  Laterality: N/A;   CYSTOSCOPY WITH LITHOLAPAXY N/A 01/04/2017   Procedure: CYSTOSCOPY WITH LITHOLAPAXY,  Holmium Laser;  Surgeon: Crist Fat, MD;  Location: [facility] ORS;  Service: Urology;  Laterality: N/A;   CYSTOSCOPY WITH RETROGRADE PYELOGRAM, URETEROSCOPY AND STENT PLACEMENT  11/03/2012   Procedure: CYSTOSCOPY WITH RETROGRADE PYELOGRAM, URETEROSCOPY AND STENT PLACEMENT;  Surgeon: Valetta Fuller, MD;  Location[facility];  Service: Urology;  Laterality: Left;  Cystoscopy/Left Retrograde/Ureteroscopy/Holmium Laser Litho/Double J Stent   CYSTOSCOPY WITH URETEROSCOPY, STONE BASKETRY AND STENT PLACEMENT Right 05/15/2019   Procedure: CYSTOSCOPY WITH URETEROSCOPY, LASER LITHOTRIPSY STONE BASKETRY AND STENT PLACEMENT, RIGHT RETROGRADE;  Surgeon: Crist Fat, MD;  Location: [facility];  Service: Urology;  Laterality: Right;   HOLMIUM LASER APPLICATION  11/03/2012   Procedure: HOLMIUM LASER APPLICATION;  Surgeon: Onalee Hua  Ellin Goodie, MD;  Location: [facility];  Service: Urology;  Laterality: Left;   HOLMIUM LASER APPLICATION N/A 05/18/2015   Procedure: HOLMIUM LASER APPLICATION;  Surgeon: Barron Alvine, MD;  Location: [facility];  Service: Urology;  Laterality: N/A;   KNEE SURGERY Bilateral 1978  &  1974   LYMPHADENECTOMY Bilateral 04/22/2023   Procedure: BILATERAL PELVIC LYMPHADENECTOMY;  Surgeon: Heloise Purpura, MD;  Location: [facility];  Service: Urology;  Laterality: Bilateral;   MELANOMA EXCISION Bilateral 04/2015   20th-- left leg //  1st-- right leg w/ skin graft from right thigh (no lymph node bx)   NEPHROLITHOTOMY Right 05/17/2014   Procedure: NEPHROLITHOTOMY PERCUTANEOUS;  Surgeon: Valetta Fuller, MD;  Location: [facility];  Service: Urology;  Laterality: Right;   PILONIDAL CYST EXCISION     PROSTATE BIOPSY     ROBOT ASSISTED LAPAROSCOPIC RADICAL PROSTATECTOMY N/A 04/22/2023   Procedure: XI ROBOTIC ASSISTED LAPAROSCOPIC RADICAL PROSTATECTOMY LEVEL 2;  Surgeon: Heloise Purpura, MD;  Location: [facility];  Service: Urology;  Laterality: N/A;    ROBOTIC ASSITED PARTIAL NEPHRECTOMY Left 06/18/2013   Procedure: ROBOTIC ASSISTED PARTIAL NEPHRECTOMY;  Surgeon: [name], MD;  Location: [facility];  Service: Urology;  Laterality: Left;   ROTATOR CUFF REPAIR Left 07/2016   SHOULDER ARTHROSCOPY WITH ROTATOR CUFF REPAIR AND SUBACROMIAL DECOMPRESSION Right 05/27/2020   Procedure: RIGHT SHOULDER ARTHROSCOPY WITH EXTENSIVE DEBRIDEMENT, DISTAL CLAVICLE EXCISION AND SUBACROMIAL DECOMPRESSION;  Surgeon: Tarry Kos, MD;  Location: [facility];  Service: Orthopedics;  Laterality: Right;   TEE WITH CARDIOVERSION  12/27/2020   TEE WITHOUT CARDIOVERSION N/A 12/27/2020   Procedure: TRANSESOPHAGEAL ECHOCARDIOGRAM (TEE);  Surgeon: Chilton Si, MD;  Location: [facility] ENDOSCOPY;  Service: Cardiovascular;  Laterality: N/A;   TONSILLECTOMY  61  (age 22)   TOTAL KNEE ARTHROPLASTY Left 02/07/2017   Procedure: LEFT TOTAL KNEE ARTHROPLASTY;  Surgeon: Tarry Kos, MD;  Location: [facility] OR;  Service: Orthopedics;  Laterality: Left;   TOTAL KNEE ARTHROPLASTY Right 04/25/2017   Procedure: RIGHT TOTAL KNEE ARTHROPLASTY;  Surgeon: Tarry Kos, MD;  Location: [facility] OR;  Service: Orthopedics;  Laterality: Right;   TOTAL KNEE REVISION Left 07/13/2022   Procedure: LEFT TOTAL KNEE REVISION, TIBIAL COMPONENT;  Surgeon: Tarry Kos, MD;  Location: [facility] OR;  Service: Orthopedics;  Laterality: Left;   TRANSURETHRAL RESECTION OF PROSTATE N/A 01/04/2017   Procedure: TRANSURETHRAL RESECTION OF THE PROSTATE (TURP);  Surgeon: Crist Fat, MD;  Location: [facility];  Service: Urology;  Laterality: N/A;   Social History   Socioeconomic History   Marital status: Married    Spouse name: Not on file   Number of children: 2   Years of education: 16   Highest education level: Not on file  Occupational History   Occupation: Emergency planning/management officer, Mining engineer  Tobacco Use   Smoking status: Former    Current packs/day: 0.00    Average packs/day: 1 pack/day for 6.0 years  (6.0 ttl pk-yrs)    Types: Cigarettes    Start date: 11/01/1963    Quit date: 10/31/1969    Years since quitting: 53.7   Smokeless tobacco: Never  Vaping Use   Vaping status: Never Used  Substance and Sexual Activity   Alcohol use: Yes    Alcohol/week: 0.0 standard drinks of alcohol    Comment: OCCASIONAL   Drug use: No   Sexual activity: Never  Other Topics Concern   Not on file  Social History Narrative   Regular  exercise-no   Social Determinants of Health   Financial Resource Strain: Not on file  Food Insecurity: No Food Insecurity (04/22/2023)   Hunger Vital Sign    Worried About Running Out of Food in the Last Year: Never true    Ran Out of Food in the Last Year: Never true  Transportation Needs: No Transportation Needs (04/22/2023)   PRAPARE - Administrator, Civil Service (Medical): No    Lack of Transportation (Non-Medical): No  Physical Activity: Not on file  Stress: Not on file  Social Connections: Not on file  Intimate Partner Violence: Not At Risk (04/22/2023)   Humiliation, Afraid, Rape, and Kick questionnaire    Fear of Current or Ex-Partner: No    Emotionally Abused: No    Physically Abused: No    Sexually Abused: No   Current Outpatient Medications on File Prior to Visit  Medication Sig Dispense Refill   dapagliflozin propanediol (FARXIGA) 10 MG TABS tablet Take 1 tablet (10 mg total) by mouth daily before breakfast. (Patient not taking: Reported on 05/17/2023) 90 tablet 3   docusate sodium (COLACE) 100 MG capsule Take 1 capsule (100 mg total) by mouth 2 (two) times daily. (Patient not taking: Reported on 05/17/2023)     insulin degludec (TRESIBA FLEXTOUCH) 200 UNIT/ML FlexTouch Pen Inject 90 Units into the skin daily. 27 mL 3   Insulin Pen Needle (PEN NEEDLES) 30G X 8 MM MISC 1 each by Does not apply route daily. E11.9 90 each 0   Lancets (ONETOUCH DELICA PLUS LANCET33G) MISC 1 each by Other route 2 (two) times daily. E11.9 200 each 3   methocarbamol  (ROBAXIN) 750 MG tablet Take 1 tablet (750 mg total) by mouth 2 (two) times daily as needed for muscle spasms. (Patient not taking: Reported on 04/08/2023) 30 tablet 3   metoprolol succinate (TOPROL-XL) 50 MG 24 hr tablet TAKE 1 TABLET DAILY, TAKE WITH OR IMMEDIATELY FOLLOWING A MEAL 90 tablet 3   nitroGLYCERIN (NITROSTAT) 0.4 MG SL tablet Place 0.4 mg under the tongue every 5 (five) minutes x 3 doses as needed for chest pain.     ONETOUCH ULTRA test strip USE 1 STRIP TWICE A DAY 200 strip 3   oxybutynin (DITROPAN) 5 MG tablet Take 1 tablet (5 mg total) by mouth 2 (two) times daily. (Patient not taking: Reported on 05/17/2023) 30 tablet 0   rosuvastatin (CRESTOR) 20 MG tablet TAKE 1 TABLET DAILY (Patient not taking: Reported on 05/17/2023) 90 tablet 3   sacubitril-valsartan (ENTRESTO) 97-103 MG Take 1 tablet by mouth 2 (two) times daily. 180 tablet 3   Semaglutide,0.25 or 0.5MG /DOS, 2 MG/3ML SOPN Inject 0.5 mg into the skin once a week. (Patient not taking: Reported on 05/17/2023) 9 mL 3   spironolactone (ALDACTONE) 25 MG tablet TAKE ONE-HALF (1/2) TABLET DAILY (Patient not taking: Reported on 05/17/2023) 45 tablet 3   sulfamethoxazole-trimethoprim (BACTRIM DS) 800-160 MG tablet Take 1 tablet by mouth 2 (two) times daily. Start the day prior to foley removal appointment (Patient not taking: Reported on 05/17/2023) 6 tablet 0   traMADol (ULTRAM) 50 MG tablet Take 1-2 tablets (50-100 mg total) by mouth every 6 (six) hours as needed for moderate pain or severe pain. (Patient not taking: Reported on 05/17/2023) 20 tablet 0   trolamine salicylate (ASPERCREME) 10 % cream Apply 1 application  topically 3 (three) times daily as needed for muscle pain.     No current facility-administered medications on file prior to visit.  Allergies  Allergen Reactions   Actos [Pioglitazone] Swelling and Cough    SWELLING REACTION UNSPECIFIED  EDEMA   Family History  Problem Relation Age of Onset   Arthritis Mother     Hypertension Mother    Arthritis Father    Hypertension Father    Diabetes Father    Heart attack Brother 16   Diabetes Brother    Cancer Maternal Uncle        unknown type   Prostate cancer Paternal Uncle 62 - 75       metastatic   PE: BP (!) 140/80   Pulse 70   Ht 5\' 11"  (1.803 m)   Wt 229 lb (103.9 kg)   SpO2 99%   BMI 31.94 kg/m  Wt Readings from Last 3 Encounters:  07/11/23 229 lb (103.9 kg)  05/17/23 226 lb 9.6 oz (102.8 kg)  04/22/23 225 lb (102.1 kg)   Constitutional: overweight, in NAD Eyes: no exophthalmos ENT: no thyromegaly, no cervical lymphadenopathy Cardiovascular: RRR, No RG, + 1/6 SEM, + L LE slight swelling Respiratory: CTA B Musculoskeletal: no deformities Skin: + rashes - stasis dermatitis on B legs Neurological: no tremor with outstretched hands  ASSESSMENT: 1. DM2, insulin-dependent, uncontrolled, with complications - CAD - NICMP - mild CKD  No family history of medullary thyroid cancer or personal history of pancreatitis.  2. HL  PLAN:  1. Patient with longstanding, uncontrolled, type 2 diabetes, on oral antidiabetic regimen with SGLT2 inhibitor, and also long-acting insulin and weekly GLP-1 receptor agonist, with improved diabetes control on Ozempic.  At last visit, HbA1c was lower, at 7.6%.  Sugars are much better, improved in the morning, mostly up to 150s but occasionally higher.  He was not checking sugars later in the day and I advised him to start doing so, rotating check times.  He had constipation so we did not increase his Ozempic dose even though HbA1c was not quite at goal.  He is retiring next month and is worried about Medicare covering Ozempic.  However, he does have coronary artery disease and also obesity so he is a great candidate for this. -He had another HbA1c obtained 3 months ago and this was stable, at 7.6%. -At today's visit, he tells me that he is off Ozempic and would not want to start this due to price mostly but also as  he did not feel that this was helping.  I explained that his blood sugars did decrease on Ozempic but also, his weight was lower and this also had benefits on the cardiovascular and renal systems.  However, for now, he would not want to start this. -We discussed about options for treatment.  For now, I suggested to add mealtime insulin and continue [place].  He agrees with this plan.  We discussed about correct injection techniques, and, if Lyumjev is approved by his insurance, he can inject this at the beginning of his meals.  If Humalog or NovoLog preferred, he needs to inject these 15 minutes before meals.  I advised him how to harried the doses based on the size and consistency of his meals.  I also strongly advised him to start improving his diet especially as adding more insulin will probably cause more weight gain. - I suggested to:  Patient Instructions  Please continue: - Farxiga 10 mg in am - Tresiba 90 units daily  Please start: - Lyumjev 6-10 units 3x a day before meals  Please return in 3 months with your  sugar log.   - we checked his HbA1c: 8.6% (increased) - advised to check sugars at different times of the day - 1x a day, rotating check times - advised for yearly eye exams >> he is UTD - return to clinic in 3-4 months  2. HL -Reviewed latest lipid panel: LDL close to our goal of less than 55, HDL slightly low: Lab Results  Component Value Date   CHOL 114 09/07/2022   HDL 36 (L) 09/07/2022   LDLCALC 57 09/07/2022   TRIG 112 09/07/2022   CHOLHDL 3.2 09/07/2022  -He is on Crestor 20 mg daily without side effects  Carlus Pavlov, MD PhD [facility] Endocrinology

## 2023-07-11 NOTE — Patient Instructions (Addendum)
Please continue: - Farxiga 10 mg in am - Tresiba 90 units daily  Please start: - Lyumjev 6-10 units 3x a day before meals  Please return in 3 months with your sugar log.

## 2023-07-12 ENCOUNTER — Ambulatory Visit: Payer: BC Managed Care – PPO | Admitting: Orthopaedic Surgery

## 2023-07-12 ENCOUNTER — Encounter: Payer: Self-pay | Admitting: Orthopaedic Surgery

## 2023-07-12 ENCOUNTER — Other Ambulatory Visit (INDEPENDENT_AMBULATORY_CARE_PROVIDER_SITE_OTHER): Payer: BC Managed Care – PPO

## 2023-07-12 DIAGNOSIS — Z96652 Presence of left artificial knee joint: Secondary | ICD-10-CM

## 2023-07-12 DIAGNOSIS — T84033D Mechanical loosening of internal left knee prosthetic joint, subsequent encounter: Secondary | ICD-10-CM

## 2023-07-12 NOTE — Progress Notes (Signed)
Office Visit Note   Patient: Matthew Schmitt           Date of Birth: 1951/07/26           MRN: [id number] Visit Date: 07/12/2023              Requested by: Ailene Ravel, MD [address] [place],  [place] [address] PCP: Ailene Ravel, MD   Assessment & Plan: Visit Diagnoses:  1. S/P TKR (total knee replacement), left     Plan: Matthew Schmitt is now 1 year status post revision of the tibial baseplate for aseptic loosening.  He is doing well overall.  He is in the process of getting treated for prostate cancer.  The knee seems to be functioning well for him.  Will see him back in another year with repeat x-rays of the left knee, sooner if he has any problems.  Follow-Up Instructions: Return in about 1 year (around 07/11/2024).   Orders:  Orders Placed This Encounter  Procedures   XR Knee 1-2 Views Left   No orders of the defined types were placed in this encounter.     Procedures: No procedures performed   Clinical Data: No additional findings.   Subjective: Chief Complaint  Patient presents with   Left Knee - Follow-up    Left total knee arthroplasty 07/13/2022    HPI Matthew Schmitt is 1 year status post left total knee revision of the tibial baseplate for aseptic loosening.  Overall he is doing well in regards to the knee.  He has been dealing with prostate cancer since June.  Review of Systems   Objective: Vital Signs: There were no vitals taken for this visit.  Physical Exam  Ortho Exam Examination of left knee shows fully healed surgical scar.  Excellent range of motion.  Small effusion.  No signs of infection. Specialty Comments:  No specialty comments available.  Imaging: No results found.   PMFS History: Patient Active Problem List   Diagnosis Date Noted   Prostate cancer (HCC) 04/22/2023   Genetic testing 03/22/2023   Malignant neoplasm of prostate (HCC) 03/11/2023   Status post revision of total replacement of left knee 07/13/2022   S/P  revision of total knee, left 07/13/2022   Hypercoagulable state due to atrial flutter (HCC) 02/20/2022   Preop cardiovascular exam 02/20/2022   Loose left total knee arthroplasty (HCC) 02/14/2022   Chronic systolic heart failure (HCC) 08/18/2021   Aortic stenosis 08/18/2021   Chronic kidney disease, stage 3a (HCC) 08/18/2021   Dilated aortic root (HCC) 08/18/2021   Bicuspid aortic valve 08/18/2021   AKI (acute kidney injury) (HCC) 12/29/2020   Hypokalemia 12/29/2020   Obstructive sleep apnea 12/29/2020   Thrombocytopenia (HCC) 12/29/2020   Hematuria 12/29/2020   Wears glasses    Type 2 diabetes mellitus treated with insulin (HCC)    Right ureteral stone    RBBB    PAF (paroxysmal atrial fibrillation) (HCC)    Multiple renal cysts    Impotence of organic origin    History of melanoma excision    History of kidney stones    History of bladder stone    Heart murmur    First degree heart block    Hypertension    BPH associated with nocturia    Arthritis    Anxiety    Tendinopathy of right rotator cuff 01/29/2020   Tendinopathy of right biceps tendon 01/29/2020   Impingement syndrome of right shoulder 01/29/2020  Arthrosis of right acromioclavicular joint 01/29/2020   Chronic right shoulder pain 01/01/2020   Status post total left knee replacement 01/01/2020   Left-sided low back pain with left-sided sciatica 01/23/2019   Poorly controlled type 2 diabetes mellitus with circulatory disorder (HCC) 01/18/2018   History of bilateral knee replacement 02/07/2017   Primary osteoarthritis of left knee    History of renal cell carcinoma 01/2017   BPH with obstruction/lower urinary tract symptoms 01/04/2017   Rotator cuff syndrome of left shoulder 04/17/2016   Nephrolithiasis 05/17/2014   Encounter for long-term (current) use of other medications 07/27/2013   Long term current use of anticoagulant therapy    CAD (coronary artery disease) 04/02/2013   Hypertensive heart disease  04/02/2013   RBBB (right bundle branch block with left anterior fascicular block) 04/02/2013   Atrial flutter (HCC)    Nonischemic cardiomyopathy (HCC)    Obesity (BMI 30-39.9)    History of kidney stones    Osteoarthritis    Hyperlipidemia    Past Medical History:  Diagnosis Date   Anxiety    Arthritis    everywhere   Arthrosis of right acromioclavicular joint 01/29/2020   Atrial flutter [facility])    Onset May 2014 Cardioversion done    BPH associated with nocturia    BPH with obstruction/lower urinary tract symptoms 01/04/2017   CAD (coronary artery disease) 04/02/2013   Cath 2012 moderate mid LAD lesion, otherwise not much disease    Cancer (HCC)    Diabetes (HCC) 01/18/2018   Dilated aortic root (HCC)    aortic root 44 mm, ascending aorta 46 mm 12/22/21 echo   Encounter for long-term (current) use of other medications 07/27/2013   Essential hypertension, benign    First degree heart block    Heart murmur    mild-moderate AS and AR 01/11/22   History of bilateral knee replacement 02/07/2017   History of bladder stone    History of kidney stones    History of melanoma excision    June 2016  --- s/p  excision right  leg (per pt localized and no recurrence)   Hypertensive heart disease 04/02/2013   Impingement syndrome of right shoulder 01/29/2020   Impotence of organic origin    Left-sided low back pain with left-sided sciatica 01/23/2019   Long term current use of anticoagulant therapy    Mixed hyperlipidemia    slightly   Multiple renal cysts    bilateral   Nephrolithiasis 05/17/2014   Nonischemic cardiomyopathy (HCC)    Osteoarthritis    PAF (paroxysmal atrial fibrillation) (HCC)    followed by cardiology  (takes ASA)   Peripheral vascular disease (HCC)    RBBB    RBBB (right bundle branch block with left anterior fascicular block) 04/02/2013   Renal oncocytoma of left kidney 06/18/2013   s/p left robotic assited laparoscopic partial nephrectomy 06/18/13   Right  ureteral stone    Rotator cuff syndrome of left shoulder 04/17/2016   Status post total left knee replacement 01/01/2020   Tendinopathy of right biceps tendon 01/29/2020   Tendinopathy of right rotator cuff 01/29/2020   Type 2 diabetes mellitus treated with insulin [facility])    endocrinologist-  dr Everardo All   Wears glasses     Family History  Problem Relation Age of Onset   Arthritis Mother    Hypertension Mother    Arthritis Father    Hypertension Father    Diabetes Father    Heart attack Brother 24   Diabetes Brother  Cancer Maternal Uncle        unknown type   Prostate cancer Paternal Uncle 71 - 69       metastatic    Past Surgical History:  Procedure Laterality Date   A-FLUTTER ABLATION N/A 10/27/2021   Procedure: A-FLUTTER ABLATION;  Surgeon: Lanier Prude, MD;  Location: [facility] INVASIVE CV LAB;  Service: Cardiovascular;  Laterality: N/A;   CARDIAC CATHETERIZATION  08-17-2011  DR Karleen Hampshire TILLEY   POSSIBLE MODERATE LAD STENOSIS JUST AFTER THE BIFURCATION OF LARGE DIAGONAL BRANCH/ LVEF  40-45%/ MILD GLOBAL HYPODINESIS (CATH DONE FOR ABNORMAL STRESS TEST OF FIXED LATERAL WALL DEFECT & BIFASCICULAR BLAOCK)   CARDIOVERSION N/A 04/02/2013   Procedure: CARDIOVERSION;  Surgeon: Othella Boyer, MD;  Location: [facility] OR;  Service: Cardiovascular;  Laterality: N/A;   CARDIOVERSION N/A 12/27/2020   Procedure: CARDIOVERSION;  Surgeon: Chilton Si, MD;  Location: [facility] ENDOSCOPY;  Service: Cardiovascular;  Laterality: N/A;   CYSTOSCOPY WITH LITHOLAPAXY N/A 05/18/2015   Procedure: CYSTOSCOPY WITH LITHOLAPAXY;  Surgeon: Barron Alvine, MD;  Location: [facility];  Service: Urology;  Laterality: N/A;   CYSTOSCOPY WITH LITHOLAPAXY N/A 01/04/2017   Procedure: CYSTOSCOPY WITH LITHOLAPAXY, Holmium Laser;  Surgeon: Crist Fat, MD;  Location: [facility] ORS;  Service: Urology;  Laterality: N/A;   CYSTOSCOPY WITH RETROGRADE PYELOGRAM, URETEROSCOPY AND STENT PLACEMENT  11/03/2012    Procedure: CYSTOSCOPY WITH RETROGRADE PYELOGRAM, URETEROSCOPY AND STENT PLACEMENT;  Surgeon: Valetta Fuller, MD;  Location: [facility];  Service: Urology;  Laterality: Left;  Cystoscopy/Left Retrograde/Ureteroscopy/Holmium Laser Litho/Double J Stent   CYSTOSCOPY WITH URETEROSCOPY, STONE BASKETRY AND STENT PLACEMENT Right 05/15/2019   Procedure: CYSTOSCOPY WITH URETEROSCOPY, LASER LITHOTRIPSY STONE BASKETRY AND STENT PLACEMENT, RIGHT RETROGRADE;  Surgeon: Crist Fat, MD;  Location: [facility];  Service: Urology;  Laterality: Right;   HOLMIUM LASER APPLICATION  11/03/2012   Procedure: HOLMIUM LASER APPLICATION;  Surgeon: Valetta Fuller, MD;  Location: [facility];  Service: Urology;  Laterality: Left;   HOLMIUM LASER APPLICATION N/A 05/18/2015   Procedure: HOLMIUM LASER APPLICATION;  Surgeon: Barron Alvine, MD;  Location: [facility];  Service: Urology;  Laterality: N/A;   KNEE SURGERY Bilateral 1978  &  1974   LYMPHADENECTOMY Bilateral 04/22/2023   Procedure: BILATERAL PELVIC LYMPHADENECTOMY;  Surgeon: Heloise Purpura, MD;  Location: [facility];  Service: Urology;  Laterality: Bilateral;   MELANOMA EXCISION Bilateral 04/2015   20th-- left leg //  1st-- right leg w/ skin graft from right thigh (no lymph node bx)   NEPHROLITHOTOMY Right 05/17/2014   Procedure: NEPHROLITHOTOMY PERCUTANEOUS;  Surgeon: Valetta Fuller, MD;  Location: [facility];  Service: Urology;  Laterality: Right;   PILONIDAL CYST EXCISION     PROSTATE BIOPSY     ROBOT ASSISTED LAPAROSCOPIC RADICAL PROSTATECTOMY N/A 04/22/2023   Procedure: XI ROBOTIC ASSISTED LAPAROSCOPIC RADICAL PROSTATECTOMY LEVEL 2;  Surgeon: Heloise Purpura, MD;  Location: [facility];  Service: Urology;  Laterality: N/A;   ROBOTIC ASSITED PARTIAL NEPHRECTOMY Left 06/18/2013   Procedure: ROBOTIC ASSISTED PARTIAL NEPHRECTOMY;  Surgeon: [name], MD;  Location: [facility];  Service: Urology;  Laterality: Left;    ROTATOR CUFF REPAIR Left 07/2016   SHOULDER ARTHROSCOPY WITH ROTATOR CUFF REPAIR AND SUBACROMIAL DECOMPRESSION Right 05/27/2020   Procedure: RIGHT SHOULDER ARTHROSCOPY WITH EXTENSIVE DEBRIDEMENT, DISTAL CLAVICLE EXCISION AND SUBACROMIAL DECOMPRESSION;  Surgeon: Tarry Kos, MD;  Location: [facility];  Service: Orthopedics;  Laterality: Right;   TEE WITH  CARDIOVERSION  12/27/2020   TEE WITHOUT CARDIOVERSION N/A 12/27/2020   Procedure: TRANSESOPHAGEAL ECHOCARDIOGRAM (TEE);  Surgeon: Chilton Si, MD;  Location: [facility] ENDOSCOPY;  Service: Cardiovascular;  Laterality: N/A;   TONSILLECTOMY  22  (age 42)   TOTAL KNEE ARTHROPLASTY Left 02/07/2017   Procedure: LEFT TOTAL KNEE ARTHROPLASTY;  Surgeon: Tarry Kos, MD;  Location: [facility] OR;  Service: Orthopedics;  Laterality: Left;   TOTAL KNEE ARTHROPLASTY Right 04/25/2017   Procedure: RIGHT TOTAL KNEE ARTHROPLASTY;  Surgeon: Tarry Kos, MD;  Location: [facility] OR;  Service: Orthopedics;  Laterality: Right;   TOTAL KNEE REVISION Left 07/13/2022   Procedure: LEFT TOTAL KNEE REVISION, TIBIAL COMPONENT;  Surgeon: Tarry Kos, MD;  Location: [facility] OR;  Service: Orthopedics;  Laterality: Left;   TRANSURETHRAL RESECTION OF PROSTATE N/A 01/04/2017   Procedure: TRANSURETHRAL RESECTION OF THE PROSTATE (TURP);  Surgeon: Crist Fat, MD;  Location: [facility];  Service: Urology;  Laterality: N/A;   Social History   Occupational History   Occupation: Emergency planning/management officer, Mining engineer  Tobacco Use   Smoking status: Former    Current packs/day: 0.00    Average packs/day: 1 pack/day for 6.0 years (6.0 ttl pk-yrs)    Types: Cigarettes    Start date: 11/01/1963    Quit date: 10/31/1969    Years since quitting: 53.7   Smokeless tobacco: Never  Vaping Use   Vaping status: Never Used  Substance and Sexual Activity   Alcohol use: Yes    Alcohol/week: 0.0 standard drinks of alcohol    Comment: OCCASIONAL   Drug use: No   Sexual activity: Never

## 2023-08-07 ENCOUNTER — Telehealth: Payer: Self-pay | Admitting: Internal Medicine

## 2023-08-07 NOTE — Telephone Encounter (Signed)
Form has been placed on the providers desk.

## 2023-08-07 NOTE — Telephone Encounter (Signed)
Patient brought by a DOT form to be completed.  Form is in Dr. Charlean Sanfilippo folder in the front office.

## 2023-08-09 NOTE — Telephone Encounter (Signed)
Left a detailed message, paperwork is completed and placed up front for pick up.   J.Hardin Hardenbrook,RMA

## 2023-08-12 ENCOUNTER — Telehealth: Payer: Self-pay | Admitting: Cardiology

## 2023-08-12 NOTE — Telephone Encounter (Signed)
Pt c/o BP issue: STAT if pt c/o blurred vision, one-sided weakness or slurred speech  1. What are your last 5 BP readings? 176/77  2. Are you having any other symptoms (ex. Dizziness, headache, blurred vision, passed out)? Dizziness  3. What is your BP issue? Pt states that BP has been elevated for the last 2-3 weeks.

## 2023-08-12 NOTE — Telephone Encounter (Signed)
Called patient back about his message. Patient complaining of  elevated SBP running between 130's- 180's, heart rate has been fine, 60-70. Patient has some dizziness with high blood pressures, but no other symptoms. Patient is on Entresto 97/103 mg BID, spironolactone 25 mg, and metoprolol succinate 50 mg. 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 Patient is keeping a low salt diet. Will send message to Dr. Anne Fu for advisement.

## 2023-08-13 ENCOUNTER — Telehealth: Payer: Self-pay | Admitting: Cardiology

## 2023-08-13 DIAGNOSIS — I2510 Atherosclerotic heart disease of native coronary artery without angina pectoris: Secondary | ICD-10-CM

## 2023-08-13 DIAGNOSIS — Z0000 Encounter for general adult medical examination without abnormal findings: Secondary | ICD-10-CM

## 2023-08-13 NOTE — Telephone Encounter (Signed)
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 Pt is doing his DOT physical to get CDL license. Please advise

## 2023-08-13 NOTE — Telephone Encounter (Signed)
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

## 2023-08-14 MED ORDER — SPIRONOLACTONE 25 MG PO TABS: 25.0000 mg | ORAL_TABLET | Freq: Every day | ORAL | Status: DC

## 2023-08-14 NOTE — Telephone Encounter (Signed)
Left message for patient to call back  

## 2023-08-14 NOTE — Telephone Encounter (Signed)
Patient returned RN's call. 

## 2023-08-14 NOTE — Telephone Encounter (Signed)
Matthew Bathe, MD  Ethelda Chick, RN2 days ago    Increase the spironolactone from 12.5 mg to 25 mg a day.   Pt advised.

## 2023-08-19 NOTE — Telephone Encounter (Signed)
Patient is returning call. Please advise? 

## 2023-08-19 NOTE — Telephone Encounter (Signed)
Pt called and I was able to review his instructions with him.  He states understanding.  He does report he has a "bad" knee but feels like he should be able to walk the treadmill.  He is aware he will be called to be scheduled.

## 2023-08-19 NOTE — Telephone Encounter (Signed)
Left message for pt of order for exercise stress test to be completed for his DOT physical.  Requested he call back with any questions and to schedule.

## 2023-08-26 NOTE — Telephone Encounter (Signed)
GXT scheduled for 09/05/23.

## 2023-09-05 ENCOUNTER — Encounter: Payer: Self-pay | Admitting: *Deleted

## 2023-09-05 ENCOUNTER — Other Ambulatory Visit: Payer: Self-pay | Admitting: *Deleted

## 2023-09-05 ENCOUNTER — Ambulatory Visit: Payer: Medicare Other

## 2023-09-05 DIAGNOSIS — I2510 Atherosclerotic heart disease of native coronary artery without angina pectoris: Secondary | ICD-10-CM

## 2023-09-05 NOTE — Telephone Encounter (Signed)
Patient came for his GXT today 10/17. He states he is willing to try the treadmill but has been unable to complete in the past due to bad knees. Patient also has an abnormal resting EKG. Discussed with nurse and patient and we decided because of these factors it was best to switch to a [employer]

## 2023-09-09 ENCOUNTER — Telehealth (HOSPITAL_COMMUNITY): Payer: Self-pay

## 2023-09-09 NOTE — Telephone Encounter (Signed)
Patient to return call for instructions. Matthew Schmitt CCT

## 2023-09-10 ENCOUNTER — Telehealth (HOSPITAL_COMMUNITY): Payer: Self-pay | Admitting: Radiology

## 2023-09-10 NOTE — Telephone Encounter (Signed)
Patient given detailed instructions per Myocardial Perfusion Study Information Sheet for the test on 09/12/23/ at 7:15. Patient notified to arrive 15 minutes early and that it is imperative to arrive on time for appointment to keep from having the test rescheduled.  If you need to cancel or reschedule your appointment, please call the office within 24 hours of your appointment. . Patient verbalized understanding.EHK

## 2023-09-12 ENCOUNTER — Ambulatory Visit (HOSPITAL_COMMUNITY): Payer: Medicare Other | Attending: Cardiovascular Disease

## 2023-09-12 DIAGNOSIS — I2510 Atherosclerotic heart disease of native coronary artery without angina pectoris: Secondary | ICD-10-CM | POA: Insufficient documentation

## 2023-09-12 LAB — MYOCARDIAL PERFUSION IMAGING
LV dias vol: 262 mL (ref 62–150)
LV sys vol: 164 mL
Nuc Stress EF: 37 %
Peak HR: 80 {beats}/min
Rest HR: 61 {beats}/min
Rest Nuclear Isotope Dose: 8.6 mCi
SDS: 1
SRS: 10
SSS: 11
ST Depression (mm): 0 mm
Stress Nuclear Isotope Dose: 25 mCi
TID: 1.03

## 2023-09-12 MED ORDER — TECHNETIUM TC 99M TETROFOSMIN IV KIT
25.0000 | PACK | Freq: Once | INTRAVENOUS | Status: AC | PRN
  Administered 2023-09-12: 25 via INTRAVENOUS

## 2023-09-12 MED ORDER — REGADENOSON 0.4 MG/5ML IV SOLN
0.4000 mg | Freq: Once | INTRAVENOUS | Status: AC
Start: 2023-09-12 — End: 2023-09-12
  Administered 2023-09-12: 0.4 mg via INTRAVENOUS

## 2023-09-12 MED ORDER — TECHNETIUM TC 99M TETROFOSMIN IV KIT
8.6000 | PACK | Freq: Once | INTRAVENOUS | Status: AC | PRN
  Administered 2023-09-12: 8.6 via INTRAVENOUS

## 2023-09-23 ENCOUNTER — Ambulatory Visit: Payer: Medicare Other | Attending: Cardiology | Admitting: Cardiology

## 2023-09-23 ENCOUNTER — Encounter: Payer: Self-pay | Admitting: Cardiology

## 2023-09-23 VITALS — BP 142/84 | HR 76 | Ht 71.0 in | Wt 230.6 lb

## 2023-09-23 DIAGNOSIS — I480 Paroxysmal atrial fibrillation: Secondary | ICD-10-CM | POA: Insufficient documentation

## 2023-09-23 DIAGNOSIS — R9439 Abnormal result of other cardiovascular function study: Secondary | ICD-10-CM | POA: Insufficient documentation

## 2023-09-23 DIAGNOSIS — I350 Nonrheumatic aortic (valve) stenosis: Secondary | ICD-10-CM | POA: Diagnosis present

## 2023-09-23 DIAGNOSIS — Z09 Encounter for follow-up examination after completed treatment for conditions other than malignant neoplasm: Secondary | ICD-10-CM | POA: Diagnosis not present

## 2023-09-23 DIAGNOSIS — I2510 Atherosclerotic heart disease of native coronary artery without angina pectoris: Secondary | ICD-10-CM | POA: Insufficient documentation

## 2023-09-23 DIAGNOSIS — I4510 Unspecified right bundle-branch block: Secondary | ICD-10-CM | POA: Insufficient documentation

## 2023-09-23 DIAGNOSIS — Z01810 Encounter for preprocedural cardiovascular examination: Secondary | ICD-10-CM | POA: Diagnosis present

## 2023-09-23 NOTE — Patient Instructions (Signed)
Medication Instructions:  The current medical regimen is effective;  continue present plan and medications.  *If you need a refill on your cardiac medications before your next appointment, please call your pharmacy*   Lab Work: Please have blood work today (BMP,CBC)  If you have labs (blood work) drawn today and your tests are completely normal, you will receive your results only by: MyChart Message (if you have MyChart) OR A paper copy in the mail If you have any lab test that is abnormal or we need to change your treatment, we will call you to review the results.   Testing/Procedures:   [facility] A DEPT OF [place]. [facility] AT [facility] [address] [place] [address] Dept: [phone] Loc: [phone]  MARV ALFREY  09/23/2023  You are scheduled for a Cardiac Catheterization on Friday, November 8 with Dr. Peter Swaziland.  1. Please arrive at the [facility] (Main Entrance A) at [facility]: [address] [place], [place] [address] at 7:00 AM (two hours before your procedure to ensure your preparation). Free valet parking service is available.   Special note: Every effort is made to have your procedure done on time. Please understand that emergencies sometimes delay scheduled procedures.  2. Diet: Do not eat or drink anything after midnight prior to your procedure except sips of water to take medications.  3. Labs:  today (BMP,CBC)  4. Medication instructions in preparation for your procedure:  Take only 3-5 units of insulin the night before your procedure. Do not take any insulin on the day of the procedure.  On the morning of your procedure, take your Aspirin 81 mg and any morning medicines NOT listed above.  You may use sips of water.  5. Plan for one night stay--bring personal belongings. 6. Bring a current list of your medications and current insurance cards. 7. You MUST have a  responsible person to drive you home. 8. Someone MUST be with you the first 24 hours after you arrive home or your discharge will be delayed. 9. Please wear clothes that are easy to get on and off and wear slip-on shoes.  Thank you for allowing [place] to care for you!   -- [facility]    Follow-Up: At [facility], you and your health needs are our priority.  As part of our continuing mission to provide you with exceptional heart care, we have created designated Provider Care Teams.  These Care Teams include your primary Cardiologist (physician) and Advanced Practice Providers (APPs -  Physician Assistants and Nurse Practitioners) who all work together to provide you with the care you need, when you need it.  We recommend signing up for the patient portal called "MyChart".  Sign up information is provided on this After Visit Summary.  MyChart is used to connect with patients for Virtual Visits (Telemedicine).  Patients are able to view lab/test results, encounter notes, upcoming appointments, etc.  Non-urgent messages can be sent to your provider as well.   To learn more about what you can do with MyChart, go to [email or web address].    Your next appointment:   1 month(s)  Provider:   Jari Favre, PA-C, Robin Searing, NP, Jacolyn Reedy, PA-C, Eligha Bridegroom, NP, Tereso Newcomer, PA-C, or Perlie Gold, PA-C

## 2023-09-23 NOTE — Progress Notes (Signed)
Cardiology Office Note:  .   Date:  09/23/2023  ID:  Matthew Schmitt, DOB 07/11/1951, MRN [id number] PCP: Practice, [facility] Providers Cardiologist:  Donato Schultz, MD Electrophysiologist:  Lanier Prude, MD     History of Present Illness: .   Matthew Schmitt is a 72 y.o. male Discussed with the use of AI scribe software  History of Present Illness   The patient, a 72 year old male with a history of prostate cancer and cardiac disease, presented for a cardiac catheterization following an abnormal stress test. The stress test revealed an ejection fraction of 37%, with a moderate reduction in uptake in the apical to basal inferior and apical location. The patient had a previous cardiac catheterization in 2012, which showed moderate LED stenosis.  The patient reported feeling well overall, with no specific complaints. However, he expressed concern about the reduced ejection fraction and hoped for a false reading. He also mentioned a history of prostate cancer and its treatment, which he felt might have affected his overall health.  The patient reported issues with urinary flow, which he attributed to the diuretic medications he was taking for his heart condition. He admitted to occasionally skipping these medications due to the urinary side effects. He also mentioned a recent change in his insurance status, which might affect his ability to afford his medications in the future.  The patient reported a recent return to work after a brief period of retirement, which he found unsatisfying. 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    ROS: No bleeding, no fevers  Studies Reviewed: Marland Kitchen   EKG Interpretation Date/Time:  Monday September 23 2023 10:45:49 EST Ventricular Rate:  76 PR Interval:  206 QRS Duration:  158 QT Interval:  448 QTC Calculation: 504 R  Axis:   -83  Text Interpretation: Sinus rhythm with occasional Premature ventricular complexes Right bundle branch block Left anterior fascicular block Bifascicular block Left ventricular hypertrophy with repolarization abnormality ( R in aVL , Romhilt-Estes ) Cannot rule out Septal infarct , age undetermined When compared with ECG of 17-May-2023 08:29, Premature ventricular complexes are now Present Confirmed by Donato Schultz ([id number]) on 09/23/2023 10:48:33 AM    Results DIAGNOSTIC Stress test: Ejection fraction 37%, moderate reduction in uptake in apical to basal inferior and apical location Cardiac catheterization: Moderate LAD stenosis (2012) Echocardiogram: Ejection fraction around 50%, moderate aortic stenosis (02/19/2023)  Hemoglobin A1c 8.6, LDL 57, potassium 3.7 creatinine 0.88 on 04/28/2023  Risk Assessment/Calculations:           Physical Exam:   VS:  BP (!) 142/84   Pulse 76   Ht 5\' 11"  (1.803 m)   Wt 230 lb 9.6 oz (104.6 kg)   SpO2 94%   BMI 32.16 kg/m    Wt Readings from Last 3 Encounters:  09/23/23 230 lb 9.6 oz (104.6 kg)  09/12/23 229 lb (103.9 kg)  07/11/23 229 lb (103.9 kg)    GEN: Well nourished, well developed in no acute distress NECK: No JVD; No carotid bruits CARDIAC: RRR, 3/6 SM, no rubs, no gallops RESPIRATORY:  Clear to auscultation without rales, wheezing or rhonchi  ABDOMEN: Soft, non-tender, non-distended EXTREMITIES:  No edema; No deformity   ASSESSMENT AND PLAN: .        Abnormal Stress Test Ejection fraction of 37% with moderate reduction  in uptake in the apical to basal inferior and apical location. Prior cardiac catheterization in 2012 showed moderate LAD stenosis. -Plan for right and left cardiac catheterization to assess for progression of disease and to evaluate aortic valve stenosis.    Moderate Aortic Stenosis Noted on echocardiogram on 02/19/2023. -Will obtain pressures during cardiac catheterization to further evaluate the aortic  valve. -Aortic valve mean gradient measures 19.0 mmHg. Aortic valve Vmax measures 3.00 m/s. DVI 0.33.   Atrial flutter ablation 10/2021 -Xarelto has been discontinued in the past.  Medication Compliance Patient reports inconsistent medication use due to diuretic side effects with underlying urinary incontinence secondary to prostate cancer surgery. -Encourage consistent medication use and adherence to a low sodium, Mediterranean diet.  Insurance and Prescription Coverage Patient reports changes in insurance coverage affecting prescription coverage. -Advise patient to provide updated insurance information to front desk for prescription coverage evaluation.  General Health Maintenance -Encourage continued physical activity and employment for overall health and wellbeing.           Informed Consent   Shared Decision Making/Informed Consent The risks [stroke (1 in 1000), death (1 in 1000), kidney failure [usually temporary] (1 in 500), bleeding (1 in 200), allergic reaction [possibly serious] (1 in 200)], benefits (diagnostic support and management of coronary artery disease) and alternatives of a cardiac catheterization were discussed in detail with Mr. Ozburn and he is willing to proceed.     Dispo: post cath  Signed, Donato Schultz, MD

## 2023-09-23 NOTE — H&P (View-Only) (Signed)
 Cardiology Office Note:  .   Date:  09/23/2023  ID:  Matthew Schmitt, DOB 07/11/1951, MRN [id number] PCP: Practice, [facility]  [facility] Providers Cardiologist:  Donato Schultz, MD Electrophysiologist:  Lanier Prude, MD     History of Present Illness: .   Matthew Schmitt is a 72 y.o. male Discussed with the use of AI scribe software  History of Present Illness   The patient, a 72 year old male with a history of prostate cancer and cardiac disease, presented for a cardiac catheterization following an abnormal stress test. The stress test revealed an ejection fraction of 37%, with a moderate reduction in uptake in the apical to basal inferior and apical location. The patient had a previous cardiac catheterization in 2012, which showed moderate LED stenosis.  The patient reported feeling well overall, with no specific complaints. However, he expressed concern about the reduced ejection fraction and hoped for a false reading. He also mentioned a history of prostate cancer and its treatment, which he felt might have affected his overall health.  The patient reported issues with urinary flow, which he attributed to the diuretic medications he was taking for his heart condition. He admitted to occasionally skipping these medications due to the urinary side effects. He also mentioned a recent change in his insurance status, which might affect his ability to afford his medications in the future.  The patient reported a recent return to work after a brief period of retirement, which he found unsatisfying. 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    ROS: No bleeding, no fevers  Studies Reviewed: Marland Kitchen   EKG Interpretation Date/Time:  Monday September 23 2023 10:45:49 EST Ventricular Rate:  76 PR Interval:  206 QRS Duration:  158 QT Interval:  448 QTC Calculation: 504 R  Axis:   -83  Text Interpretation: Sinus rhythm with occasional Premature ventricular complexes Right bundle branch block Left anterior fascicular block Bifascicular block Left ventricular hypertrophy with repolarization abnormality ( R in aVL , Romhilt-Estes ) Cannot rule out Septal infarct , age undetermined When compared with ECG of 17-May-2023 08:29, Premature ventricular complexes are now Present Confirmed by Donato Schultz ([id number]) on 09/23/2023 10:48:33 AM    Results DIAGNOSTIC Stress test: Ejection fraction 37%, moderate reduction in uptake in apical to basal inferior and apical location Cardiac catheterization: Moderate LAD stenosis (2012) Echocardiogram: Ejection fraction around 50%, moderate aortic stenosis (02/19/2023)  Hemoglobin A1c 8.6, LDL 57, potassium 3.7 creatinine 0.88 on 04/28/2023  Risk Assessment/Calculations:           Physical Exam:   VS:  BP (!) 142/84   Pulse 76   Ht 5\' 11"  (1.803 m)   Wt 230 lb 9.6 oz (104.6 kg)   SpO2 94%   BMI 32.16 kg/m    Wt Readings from Last 3 Encounters:  09/23/23 230 lb 9.6 oz (104.6 kg)  09/12/23 229 lb (103.9 kg)  07/11/23 229 lb (103.9 kg)    GEN: Well nourished, well developed in no acute distress NECK: No JVD; No carotid bruits CARDIAC: RRR, 3/6 SM, no rubs, no gallops RESPIRATORY:  Clear to auscultation without rales, wheezing or rhonchi  ABDOMEN: Soft, non-tender, non-distended EXTREMITIES:  No edema; No deformity   ASSESSMENT AND PLAN: .        Abnormal Stress Test Ejection fraction of 37% with moderate reduction  in uptake in the apical to basal inferior and apical location. Prior cardiac catheterization in 2012 showed moderate LAD stenosis. -Plan for right and left cardiac catheterization to assess for progression of disease and to evaluate aortic valve stenosis.    Moderate Aortic Stenosis Noted on echocardiogram on 02/19/2023. -Will obtain pressures during cardiac catheterization to further evaluate the aortic  valve. -Aortic valve mean gradient measures 19.0 mmHg. Aortic valve Vmax measures 3.00 m/s. DVI 0.33.   Atrial flutter ablation 10/2021 -Xarelto has been discontinued in the past.  Medication Compliance Patient reports inconsistent medication use due to diuretic side effects with underlying urinary incontinence secondary to prostate cancer surgery. -Encourage consistent medication use and adherence to a low sodium, Mediterranean diet.  Insurance and Prescription Coverage Patient reports changes in insurance coverage affecting prescription coverage. -Advise patient to provide updated insurance information to front desk for prescription coverage evaluation.  General Health Maintenance -Encourage continued physical activity and employment for overall health and wellbeing.           Informed Consent   Shared Decision Making/Informed Consent The risks [stroke (1 in 1000), death (1 in 1000), kidney failure [usually temporary] (1 in 500), bleeding (1 in 200), allergic reaction [possibly serious] (1 in 200)], benefits (diagnostic support and management of coronary artery disease) and alternatives of a cardiac catheterization were discussed in detail with Mr. Ozburn and he is willing to proceed.     Dispo: post cath  Signed, Donato Schultz, MD

## 2023-09-24 LAB — BASIC METABOLIC PANEL
BUN/Creatinine Ratio: 17 (ref 10–24)
BUN: 18 mg/dL (ref 8–27)
CO2: 28 mmol/L (ref 20–29)
Calcium: 9.8 mg/dL (ref 8.6–10.2)
Chloride: 103 mmol/L (ref 96–106)
Creatinine, Ser: 1.06 mg/dL (ref 0.76–1.27)
Glucose: 153 mg/dL — ABNORMAL HIGH (ref 70–99)
Potassium: 4.7 mmol/L (ref 3.5–5.2)
Sodium: 144 mmol/L (ref 134–144)
eGFR: 74 mL/min/{1.73_m2} (ref >59)

## 2023-09-24 LAB — CBC
Hematocrit: 49.5 % (ref 37.5–51.0)
Hemoglobin: 15.9 g/dL (ref 13.0–17.7)
MCH: 27.6 pg (ref 26.6–33.0)
MCHC: 32.1 g/dL (ref 31.5–35.7)
MCV: 86 fL (ref 79–97)
Platelets: 224 10*3/uL (ref 150–450)
RBC: 5.76 x10E6/uL (ref 4.14–5.80)
RDW: 13.1 % (ref 11.6–15.4)
WBC: 7.2 10*3/uL (ref 3.4–10.8)

## 2023-09-26 ENCOUNTER — Encounter: Payer: Self-pay | Admitting: *Deleted

## 2023-09-26 ENCOUNTER — Telehealth: Payer: Self-pay | Admitting: *Deleted

## 2023-09-26 NOTE — Telephone Encounter (Signed)
Cardiac Catheterization scheduled at [facility] for: Friday September 27, 2023 9 AM Arrival time [facility] Main Entrance A at: 7 AM  Nothing to eat after midnight prior to procedure, clear liquids until 5 AM day of procedure.  Medication instructions: -Hold:  Farxiga-AM of procedure  Insulin/Tresiba-AM of procedure  Patient reports he does not take Insulin HS.  Spironolactone-AM of procedure -Other usual morning medications can be taken with sips of water including aspirin 81 mg.  Plan to go home the same day, you will only stay overnight if medically necessary.  You must have responsible adult to drive you home.  Someone must be with you the first 24 hours after you arrive home.  Reviewed procedure instructions with patient.

## 2023-09-27 ENCOUNTER — Ambulatory Visit (HOSPITAL_COMMUNITY)
Admission: RE | Admit: 2023-09-27 | Discharge: 2023-09-27 | Disposition: A | Discharge status: Home / Self Care | Payer: Medicare Other | Attending: Cardiology | Admitting: Cardiology

## 2023-09-27 ENCOUNTER — Encounter (HOSPITAL_COMMUNITY)
Admission: RE | Disposition: A | Discharge status: Home / Self Care | Payer: Self-pay | Source: Home / Self Care | Attending: Cardiology

## 2023-09-27 DIAGNOSIS — E119 Type 2 diabetes mellitus without complications: Secondary | ICD-10-CM | POA: Diagnosis not present

## 2023-09-27 DIAGNOSIS — Z8546 Personal history of malignant neoplasm of prostate: Secondary | ICD-10-CM | POA: Insufficient documentation

## 2023-09-27 DIAGNOSIS — I2510 Atherosclerotic heart disease of native coronary artery without angina pectoris: Secondary | ICD-10-CM | POA: Insufficient documentation

## 2023-09-27 DIAGNOSIS — R9439 Abnormal result of other cardiovascular function study: Secondary | ICD-10-CM | POA: Diagnosis present

## 2023-09-27 DIAGNOSIS — I5043 Acute on chronic combined systolic (congestive) and diastolic (congestive) heart failure: Secondary | ICD-10-CM | POA: Diagnosis not present

## 2023-09-27 DIAGNOSIS — I272 Pulmonary hypertension, unspecified: Secondary | ICD-10-CM | POA: Insufficient documentation

## 2023-09-27 DIAGNOSIS — I350 Nonrheumatic aortic (valve) stenosis: Secondary | ICD-10-CM | POA: Diagnosis not present

## 2023-09-27 HISTORY — PX: RIGHT/LEFT HEART CATH AND CORONARY ANGIOGRAPHY: CATH118266

## 2023-09-27 LAB — POCT I-STAT 7, (LYTES, BLD GAS, ICA,H+H)
Acid-Base Excess: 0 mmol/L (ref 0.0–2.0)
Bicarbonate: 24.6 mmol/L (ref 20.0–28.0)
Calcium, Ion: 1.16 mmol/L (ref 1.15–1.40)
HCT: 39 % (ref 39.0–52.0)
Hemoglobin: 13.3 g/dL (ref 13.0–17.0)
O2 Saturation: 93 %
Potassium: 3.7 mmol/L (ref 3.5–5.1)
Sodium: 143 mmol/L (ref 135–145)
TCO2: 26 mmol/L (ref 22–32)
pCO2 arterial: 40.6 mm[Hg] (ref 32–48)
pH, Arterial: 7.391 (ref 7.35–7.45)
pO2, Arterial: 68 mm[Hg] — ABNORMAL LOW (ref 83–108)

## 2023-09-27 LAB — POCT I-STAT EG7
Acid-Base Excess: 0 mmol/L (ref 0.0–2.0)
Acid-Base Excess: 0 mmol/L (ref 0.0–2.0)
Acid-Base Excess: 1 mmol/L (ref 0.0–2.0)
Bicarbonate: 26.1 mmol/L (ref 20.0–28.0)
Bicarbonate: 26.3 mmol/L (ref 20.0–28.0)
Bicarbonate: 26.7 mmol/L (ref 20.0–28.0)
Calcium, Ion: 1.18 mmol/L (ref 1.15–1.40)
Calcium, Ion: 1.2 mmol/L (ref 1.15–1.40)
Calcium, Ion: 1.22 mmol/L (ref 1.15–1.40)
HCT: 40 % (ref 39.0–52.0)
HCT: 40 % (ref 39.0–52.0)
HCT: 40 % (ref 39.0–52.0)
Hemoglobin: 13.6 g/dL (ref 13.0–17.0)
Hemoglobin: 13.6 g/dL (ref 13.0–17.0)
Hemoglobin: 13.6 g/dL (ref 13.0–17.0)
O2 Saturation: 60 %
O2 Saturation: 67 %
O2 Saturation: 68 %
Potassium: 3.7 mmol/L (ref 3.5–5.1)
Potassium: 3.7 mmol/L (ref 3.5–5.1)
Potassium: 3.8 mmol/L (ref 3.5–5.1)
Sodium: 143 mmol/L (ref 135–145)
Sodium: 144 mmol/L (ref 135–145)
Sodium: 144 mmol/L (ref 135–145)
TCO2: 28 mmol/L (ref 22–32)
TCO2: 28 mmol/L (ref 22–32)
TCO2: 28 mmol/L (ref 22–32)
pCO2, Ven: 46.9 mm[Hg] (ref 44–60)
pCO2, Ven: 47.7 mm[Hg] (ref 44–60)
pCO2, Ven: 48 mm[Hg] (ref 44–60)
pH, Ven: 7.346 (ref 7.25–7.43)
pH, Ven: 7.354 (ref 7.25–7.43)
pH, Ven: 7.355 (ref 7.25–7.43)
pO2, Ven: 33 mm[Hg] (ref 32–45)
pO2, Ven: 37 mm[Hg] (ref 32–45)
pO2, Ven: 37 mm[Hg] (ref 32–45)

## 2023-09-27 LAB — GLUCOSE, CAPILLARY
Glucose-Capillary: 150 mg/dL — ABNORMAL HIGH (ref 70–99)
Glucose-Capillary: 108 mg/dL — ABNORMAL HIGH (ref 70–99)

## 2023-09-27 SURGERY — RIGHT/LEFT HEART CATH AND CORONARY ANGIOGRAPHY
Anesthesia: LOCAL

## 2023-09-27 MED ORDER — HYDRALAZINE HCL 20 MG/ML IJ SOLN
10.0000 mg | INTRAMUSCULAR | Status: DC | PRN
  Administered 2023-09-27: 10 mg via INTRAVENOUS
  Filled 2023-09-27: qty 1

## 2023-09-27 MED ORDER — ASPIRIN 81 MG PO CHEW: 81.0000 mg | CHEWABLE_TABLET | ORAL | Status: DC

## 2023-09-27 MED ORDER — MIDAZOLAM HCL 2 MG/2ML IJ SOLN
INTRAMUSCULAR | Status: AC
  Filled 2023-09-27: qty 2

## 2023-09-27 MED ORDER — SODIUM CHLORIDE 0.9 % WEIGHT BASED INFUSION: 1.0000 mL/kg/h | INTRAVENOUS | Status: DC

## 2023-09-27 MED ORDER — ONDANSETRON HCL 4 MG/2ML IJ SOLN: 4.0000 mg | Freq: Four times a day (QID) | INTRAMUSCULAR | Status: DC | PRN

## 2023-09-27 MED ORDER — VERAPAMIL HCL 2.5 MG/ML IV SOLN
INTRAVENOUS | Status: DC | PRN
  Administered 2023-09-27: 10 mL via INTRA_ARTERIAL

## 2023-09-27 MED ORDER — HEPARIN (PORCINE) IN NACL 1000-0.9 UT/500ML-% IV SOLN
INTRAVENOUS | Status: DC | PRN
  Administered 2023-09-27 (×2): 500 mL

## 2023-09-27 MED ORDER — SODIUM CHLORIDE 0.9 % WEIGHT BASED INFUSION
3.0000 mL/kg/h | INTRAVENOUS | Status: AC
  Administered 2023-09-27: 3 mL/kg/h via INTRAVENOUS

## 2023-09-27 MED ORDER — SODIUM CHLORIDE 0.9% FLUSH: 3.0000 mL | INTRAVENOUS | Status: DC | PRN

## 2023-09-27 MED ORDER — ACETAMINOPHEN 325 MG PO TABS: 650.0000 mg | ORAL_TABLET | ORAL | Status: DC | PRN

## 2023-09-27 MED ORDER — FENTANYL CITRATE (PF) 100 MCG/2ML IJ SOLN
INTRAMUSCULAR | Status: AC
  Filled 2023-09-27: qty 2

## 2023-09-27 MED ORDER — HEPARIN SODIUM (PORCINE) 1000 UNIT/ML IJ SOLN
INTRAMUSCULAR | Status: AC
  Filled 2023-09-27: qty 10

## 2023-09-27 MED ORDER — FENTANYL CITRATE (PF) 100 MCG/2ML IJ SOLN
INTRAMUSCULAR | Status: DC | PRN
  Administered 2023-09-27: 25 ug via INTRAVENOUS

## 2023-09-27 MED ORDER — HEPARIN SODIUM (PORCINE) 1000 UNIT/ML IJ SOLN
INTRAMUSCULAR | Status: DC | PRN
  Administered 2023-09-27: 5000 [IU] via INTRAVENOUS

## 2023-09-27 MED ORDER — HYDRALAZINE HCL 20 MG/ML IJ SOLN
INTRAMUSCULAR | Status: AC
Start: 2023-09-27 — End: ?
  Filled 2023-09-27: qty 1

## 2023-09-27 MED ORDER — SODIUM CHLORIDE 0.9 % IV SOLN: 250.0000 mL | INTRAVENOUS | Status: DC | PRN

## 2023-09-27 MED ORDER — MIDAZOLAM HCL 2 MG/2ML IJ SOLN
INTRAMUSCULAR | Status: DC | PRN
  Administered 2023-09-27: 1 mg via INTRAVENOUS

## 2023-09-27 MED ORDER — VERAPAMIL HCL 2.5 MG/ML IV SOLN
INTRAVENOUS | Status: AC
  Filled 2023-09-27: qty 2

## 2023-09-27 MED ORDER — LIDOCAINE HCL (PF) 1 % IJ SOLN
INTRAMUSCULAR | Status: DC | PRN
  Administered 2023-09-27: 1 mL
  Administered 2023-09-27: 2 mL

## 2023-09-27 MED ORDER — SODIUM CHLORIDE 0.9% FLUSH: 3.0000 mL | Freq: Two times a day (BID) | INTRAVENOUS | Status: DC

## 2023-09-27 MED ORDER — IOHEXOL 350 MG/ML SOLN
INTRAVENOUS | Status: DC | PRN
  Administered 2023-09-27: 74 mL

## 2023-09-27 MED ORDER — HYDRALAZINE HCL 20 MG/ML IJ SOLN
INTRAMUSCULAR | Status: DC | PRN
  Administered 2023-09-27: 10 mg via INTRAVENOUS

## 2023-09-27 MED ORDER — LIDOCAINE HCL (PF) 1 % IJ SOLN
INTRAMUSCULAR | Status: AC
  Filled 2023-09-27: qty 30

## 2023-09-27 SURGICAL SUPPLY — 11 items
CATH 5FR JL3.5 JR4 ANG PIG MP (CATHETERS) IMPLANT
CATH BALLN WEDGE 5F 110CM (CATHETERS) IMPLANT
CATH INFINITI 5FR AL1 (CATHETERS) IMPLANT
DEVICE RAD COMP TR BAND LRG (VASCULAR PRODUCTS) IMPLANT
GLIDESHEATH SLEND SS 6F .021 (SHEATH) IMPLANT
GUIDEWIRE INQWIRE 1.5J.035X260 (WIRE) IMPLANT
INQWIRE 1.5J .035X260CM (WIRE) ×1
PACK CARDIAC CATHETERIZATION (CUSTOM PROCEDURE TRAY) ×2 IMPLANT
SET ATX-X65L (MISCELLANEOUS) IMPLANT
SHEATH GLIDE SLENDER 4/5FR (SHEATH) IMPLANT
SHEATH PROBE COVER 6X72 (BAG) IMPLANT

## 2023-09-27 NOTE — Discharge Instructions (Signed)

## 2023-09-27 NOTE — Progress Notes (Signed)
TR BAND REMOVAL  LOCATION:    right radial  DEFLATED PER PROTOCOL:    Yes.    TIME BAND OFF / DRESSING APPLIED: 09/27/23 at 1200   SITE UPON ARRIVAL:    Level 0  SITE AFTER BAND REMOVAL:    Level 0  CIRCULATION SENSATION AND MOVEMENT:    Within Normal Limits   Yes.    COMMENTS:

## 2023-09-27 NOTE — Interval H&P Note (Signed)
History and Physical Interval Note:  09/27/2023 8:46 AM  Matthew Schmitt  has presented today for surgery, with the diagnosis of abnormal stress - aortic stenosis.  The various methods of treatment have been discussed with the patient and family. After consideration of risks, benefits and other options for treatment, the patient has consented to  Procedure(s): RIGHT/LEFT HEART CATH AND CORONARY ANGIOGRAPHY (N/A) as a surgical intervention.  The patient's history has been reviewed, patient examined, no change in status, stable for surgery.  I have reviewed the patient's chart and labs.  Questions were answered to the patient's satisfaction.   Cath Lab Visit (complete for each Cath Lab visit)  Clinical Evaluation Leading to the Procedure:   ACS: No.  Non-ACS:    Anginal Classification: No Symptoms  Anti-ischemic medical therapy: Minimal Therapy (1 class of medications)  Non-Invasive Test Results: Intermediate-risk stress test findings: cardiac mortality 1-3%/year  Prior CABG: No previous CABG        Theron Arista [facility] 09/27/2023 8:46 AM

## 2023-09-30 ENCOUNTER — Encounter (HOSPITAL_COMMUNITY): Payer: Self-pay | Admitting: Cardiology

## 2023-10-02 ENCOUNTER — Ambulatory Visit: Payer: Medicare Other | Attending: Physician Assistant | Admitting: Physician Assistant

## 2023-10-02 ENCOUNTER — Encounter: Payer: Self-pay | Admitting: Physician Assistant

## 2023-10-02 VITALS — BP 180/70 | HR 66 | Ht 71.0 in | Wt 233.6 lb

## 2023-10-02 DIAGNOSIS — I5020 Unspecified systolic (congestive) heart failure: Secondary | ICD-10-CM | POA: Diagnosis present

## 2023-10-02 DIAGNOSIS — I77810 Thoracic aortic ectasia: Secondary | ICD-10-CM | POA: Insufficient documentation

## 2023-10-02 DIAGNOSIS — I350 Nonrheumatic aortic (valve) stenosis: Secondary | ICD-10-CM | POA: Insufficient documentation

## 2023-10-02 DIAGNOSIS — I10 Essential (primary) hypertension: Secondary | ICD-10-CM | POA: Diagnosis present

## 2023-10-02 DIAGNOSIS — I483 Typical atrial flutter: Secondary | ICD-10-CM | POA: Diagnosis present

## 2023-10-02 DIAGNOSIS — I2510 Atherosclerotic heart disease of native coronary artery without angina pectoris: Secondary | ICD-10-CM | POA: Diagnosis present

## 2023-10-02 MED ORDER — ASPIRIN 81 MG PO TBEC: 81.0000 mg | DELAYED_RELEASE_TABLET | Freq: Every day | ORAL | Status: AC

## 2023-10-02 NOTE — Assessment & Plan Note (Signed)
Ascending aorta dilation of 4.2 cm noted on echocardiogram. - Continue surveillance with annual echocardiograms

## 2023-10-02 NOTE — Assessment & Plan Note (Signed)
Mean gradient on echocardiogram in April 2024 was 19 mmHg.  Recent cardiac catheterization with mild aortic stenosis with gradient of 10 mmHg on pullback.  Continue with annual echocardiograms for monitoring.

## 2023-10-02 NOTE — Patient Instructions (Addendum)
Medication Instructions:  Take these medications every day. They are not diuretics and do help get your blood pressure down: - Entresto 97/103 mg twice daily  - Metoprolol succinate 50 mg once daily   Try to take this medication three days a week (every Monday, Wednesday, Friday). It is a diuretic. So, let us know if the side effects are intolerable: - Spironolactone 25 mg once daily   -Start Aspirin 81 mg taking 1 daily  *If you need a refill on your cardiac medications before your next appointment, please call your pharmacy*   Follow-Up: At [facility], you and your health needs are our priority.  As part of our continuing mission to provide you with exceptional heart care, we have created designated Provider Care Teams.  These Care Teams include your primary Cardiologist (physician) and Advanced Practice Providers (APPs -  Physician Assistants and Nurse Practitioners) who all work together to provide you with the care you need, when you need it.  We recommend signing up for the patient portal called "MyChart".  Sign up information is provided on this After Visit Summary.  MyChart is used to connect with patients for Virtual Visits (Telemedicine).  Patients are able to view lab/test results, encounter notes, upcoming appointments, etc.  Non-urgent messages can be sent to your provider as well.   To learn more about what you can do with MyChart, go to [email or web address].    Your next appointment:   6 week(s)  Provider:   Donato Schultz, MD  or Tereso Newcomer, PA-C         Other Instructions Check blood pressure daily for 2 weeks and send readings to Tereso Newcomer, PA-C   For MyChart: Username: [email or web address]  Password: [email or web address]

## 2023-10-02 NOTE — Progress Notes (Signed)
Cardiology Office Note:    Date:  10/02/2023  ID:  Matthew Schmitt, DOB 06/28/1951, MRN [id number] PCP: Practice, [facility]  [facility] Providers Cardiologist:  Donato Schultz, MD Electrophysiologist:  Lanier Prude, MD       Patient Profile:      Coronary artery disease  Nonobstructive by cardiac catheterization in the past Myoview 09/12/2023: No ischemia inferior and apical infarct, EF 37, intermediate risk LHC 09/27/2023: Proximal LAD 20, proximal LCx 25, OM1 25, proximal RCA 20; LVEDP 23, PCWP mean 19, mean PAP 28, mild aortic stenosis with 10 mmHg gradient on pullback Aortic stenosis Functionally bicuspid aortic valve TTE 02/2023: mean 19; R/L HC 09/2023: mild AS w mean 10 mmHg HFmrEF (heart failure with mildly reduced ejection fraction)  Hx of tachycardia mediated cardiomyopathy  TTE 12/27/2020: EF 20-25 TTE 05/10/2021: EF 30-35 TTE 12/22/2021: EF 50-55 (after CTI ablation) TTE 02/19/2023: EF 45-50, global HK, GR 2 DD, normal RVSF, severe LAE, mild MR, mild-moderate AI, moderate aortic stenosis (V-max 300 cm/s, mean gradient 19 mmHg, DI 0.33), ascending aorta 4.2 cm, RAP 3 AFlutter s/p CTI ablation 10/2021 Right Bundle Branch Block  Ascending aorta dilation TTE 02/2023: 4.2 cm  Prostate CA Hypertension  Hyperlipidemia  Diabetes mellitus          History of Present Illness:  Discussed the use of AI scribe software for clinical note transcription with the patient, who gave verbal consent to proceed.  Matthew Schmitt is a 72 y.o. male who returns for follow-up postcardiac catheterization.  He was last seen by Dr. Anne Fu 09/23/2023 after an abnormal stress test.  This demonstrated inferior and apical infarct but no ischemia.  Recent echocardiogram in April 2024 demonstrated mild reduced ejection fraction 45-50%.  He has had moderate aortic stenosis noted.  Right & left heart catheterization was arranged.  This demonstrated mild nonobstructive CAD.  LVEDP was  elevated as well as mean wedge pressure.  Mean aortic valve gradient was 10 mmHg on pullback.  Optimization of heart failure management was recommended.  He is here alone.  He has not had any chest symptoms, pain, or pressure, and no shortness of breath.  He has not had orthopnea, syncope.  He has chronic lower extremity edema.  He has a history of prostate cancer, for which a prostatectomy was performed, resulting in urinary incontinence. This has led to hesitancy in taking diuretics due to the exacerbation of urinary symptoms. The patient reports taking all prescribed medications, including metoprolol, only every third day. The patient also reported a weight gain of 5-8 pounds over the last couple of weeks. The patient has been managing incontinence with pads and has previously tried support socks for the leg swelling but found them difficult to put on.     Review of Systems  Gastrointestinal:  Negative for hematochezia and melena.  Genitourinary:  Negative for hematuria.     See HPI     Studies Reviewed:              Risk Assessment/Calculations:     HYPERTENSION CONTROL Vitals:   10/02/23 1432 10/02/23 1555  BP: (!) 190/80 (!) 180/70    The patient's blood pressure is elevated above target today.  In order to address the patient's elevated BP: Blood pressure will be monitored at home to determine if medication changes need to be made.          Physical Exam:   VS:  BP (!) 180/70   Pulse  66   Ht 5\' 11"  (1.803 m)   Wt 233 lb 9.6 oz (106 kg)   SpO2 98%   BMI 32.58 kg/m    Wt Readings from Last 3 Encounters:  10/02/23 233 lb 9.6 oz (106 kg)  09/27/23 230 lb (104.3 kg)  09/23/23 230 lb 9.6 oz (104.6 kg)    Constitutional:      Appearance: Healthy appearance. Not in distress.  Neck:     Vascular: No JVR. JVD normal.  Pulmonary:     Breath sounds: No wheezing. No rales.  Cardiovascular:     Normal rate. Regular rhythm.     Murmurs: There is a grade 2/6 systolic murmur at  the URSB.     Comments: R wrist without hematoma Edema:    Peripheral edema (tight with stasis changes) present.    Pretibial: bilateral 2+ edema of the pretibial area.    Ankle: bilateral 2+ edema of the ankle. Abdominal:     Palpations: Abdomen is soft.  Neurological:     General: No focal deficit present.     Mental Status: Alert and oriented to person, place and time.        Assessment and Plan:   Assessment & Plan Heart failure with mildly reduced ejection fraction (HFmrEF, 41-49%) (HCC) He is volume overloaded.  Filling pressures during recent cardiac catheterization were increased.  On exam, he has significant lower extremity edema.  His blood pressure is markedly uncontrolled.  He has only been taking medications every third day due to concerns of increased urinary frequency.  We discussed the importance of good blood pressure control in the context of managing congestive heart failure.  I have encouraged him to take his beta-blocker and ARNI on a daily basis to help manage heart failure and his blood pressure. - Take metoprolol succinate 50 mg daily - Take Entresto 97/103 mg twice daily - Try to take spironolactone 25 mg every Monday, Wednesday, and Friday - If spironolactone is intolerable, we could consider replacing with furosemide - Follow-up 8 weeks Nonrheumatic aortic valve stenosis Mean gradient on echocardiogram in April 2024 was 19 mmHg.  Recent cardiac catheterization with mild aortic stenosis with gradient of 10 mmHg on pullback.  Continue with annual echocardiograms for monitoring. Coronary artery disease involving native coronary artery of native heart without angina pectoris Recent cardiac catheterization with mild nonobstructive CAD.  He is not having chest symptoms to suggest angina. - Continue Crestor 20 mg daily - Start aspirin 81 mg daily - Continue nitroglycerin as needed Typical atrial flutter (HCC) Status post prior ablation. Ascending aorta dilation  (HCC) Ascending aorta dilation of 4.2 cm noted on echocardiogram. - Continue surveillance with annual echocardiograms Essential hypertension Blood pressure markedly uncontrolled.  As noted, he only takes his medications every third day.  I have encouraged him to take metoprolol succinate and Entresto on a daily basis.  I have helped update his password and MyChart today.  Have asked him to monitor his blood pressure and send readings in 2 weeks.  We could consider adding hydralazine +/- nitrates if blood pressure remains uncontrolled.       Dispo:  Return in about 8 weeks (around 11/27/2023).  Signed, Tereso Newcomer, PA-C

## 2023-10-02 NOTE — Assessment & Plan Note (Signed)
Status post prior ablation.

## 2023-10-02 NOTE — Assessment & Plan Note (Signed)
Recent cardiac catheterization with mild nonobstructive CAD.  He is not having chest symptoms to suggest angina. - Continue Crestor 20 mg daily - Start aspirin 81 mg daily - Continue nitroglycerin as needed

## 2023-10-02 NOTE — Assessment & Plan Note (Signed)
He is volume overloaded.  Filling pressures during recent cardiac catheterization were increased.  On exam, he has significant lower extremity edema.  His blood pressure is markedly uncontrolled.  He has only been taking medications every third day due to concerns of increased urinary frequency.  We discussed the importance of good blood pressure control in the context of managing congestive heart failure.  I have encouraged him to take his beta-blocker and ARNI on a daily basis to help manage heart failure and his blood pressure. - Take metoprolol succinate 50 mg daily - Take Entresto 97/103 mg twice daily - Try to take spironolactone 25 mg every Monday, Wednesday, and Friday - If spironolactone is intolerable, we could consider replacing with furosemide - Follow-up 8 weeks

## 2023-10-28 ENCOUNTER — Ambulatory Visit: Payer: BLUE CROSS/BLUE SHIELD | Admitting: Physician Assistant

## 2023-11-19 ENCOUNTER — Ambulatory Visit: Payer: BC Managed Care – PPO | Admitting: Internal Medicine

## 2023-11-26 NOTE — Progress Notes (Signed)
 Cardiology Office Note:    Date:  11/27/2023  ID:  Matthew Schmitt, DOB [birth year], MRN [id number] PCP: [facility]  [facility] Providers Cardiologist:  Matthew Parchment, MD Electrophysiologist:  Matthew ONEIDA HOLTS, MD       Patient Profile:      Coronary artery disease  Nonobstructive by cardiac catheterization in the past Myoview  09/12/2023: No ischemia inferior and apical infarct, EF 37, intermediate risk LHC 09/27/2023: Proximal LAD 20, proximal LCx 25, OM1 25, proximal RCA 20; LVEDP 23, PCWP mean 19, mean PAP 28, mild aortic stenosis with 10 mmHg gradient on pullback Aortic stenosis Functionally bicuspid aortic valve TTE 02/2023: mean 19; R/L HC 09/2023: mild AS w mean 10 mmHg HFmrEF (heart failure with mildly reduced ejection fraction)  Hx of tachycardia mediated cardiomyopathy  TTE 12/27/2020: EF 20-25 TTE 05/10/2021: EF 30-35 TTE 12/22/2021: EF 50-55 (after CTI ablation) TTE 02/19/2023: EF 45-50, global HK, GR 2 DD, normal RVSF, severe LAE, mild MR, mild-moderate AI, moderate aortic stenosis (V-max 300 cm/s, mean gradient 19 mmHg, DI 0.33), ascending aorta 4.2 cm, RAP 3 AFlutter s/p CTI ablation 10/2021 Right Bundle Branch Block  Ascending aorta dilation TTE 02/2023: 4.2 cm  Prostate CA Hypertension  Hyperlipidemia  Diabetes mellitus           History of Present Illness:  Discussed the use of AI scribe software for clinical note transcription with the patient, who gave verbal consent to proceed.  Matthew Schmitt is a 73 y.o. male who returns for follow up of CAD, CHF, AS. He was last seen in 09/2023 after a recent cardiac catheterization. His cath demonstrated mild AS and elevated filling pressures. He had mild nonobstructive CAD. At his OV, his BP was markedly elevated and he was volume overloaded. He was only taking meds every 3rd day. He was hesitant to take diuretics due to hx of urinary incontinence. I encouraged him to take Spironolactone  3 times a  week. He is here alone. The patient reports discontinuing his medications due to urinary incontinence, leading to elevated blood pressure readings in the 170s and 180s, and weight gain up to 235 lbs. In an attempt to manage these issues, the patient resumed his medications at night, resulting in nocturia but a significant drop in weight and blood pressure. The patient's current regimen includes Spironolactone , Farxiga , Toprol , and Entresto , all taken at night. Despite this regimen, the patient's blood pressure readings fluctuate significantly. The patient reports persistent urinary incontinence following prostatectomy secondary to cancer in 04/2023. He has had improved leg edema. He tries to wrap them when he can.     Review of Systems  Genitourinary:  Positive for bladder incontinence.  -See HPI    Studies Reviewed:             Risk Assessment/Calculations:     HYPERTENSION CONTROL Vitals:   11/27/23 1454 11/27/23 1544  BP: (!) 179/62 (!) 170/80    The patient's blood pressure is elevated above target today.  In order to address the patient's elevated BP: A new medication was prescribed today.          Physical Exam:   VS:  BP (!) 170/80   Pulse 72   Ht 5' 11 (1.803 m)   Wt 229 lb 3.2 oz (104 kg)   SpO2 98%   BMI 31.97 kg/m    Wt Readings from Last 3 Encounters:  11/27/23 229 lb 3.2 oz (104 kg)  10/02/23 233 lb 9.6 oz (  106 kg)  09/27/23 230 lb (104.3 kg)    Constitutional:      Appearance: Healthy appearance. Not in distress.  Neck:     Vascular: No JVR. JVD normal.  Pulmonary:     Breath sounds: Normal breath sounds. No wheezing. No rales.  Cardiovascular:     Normal rate. Regular rhythm.     Murmurs: There is no murmur.  Edema:    Peripheral edema present.    Pretibial: bilateral 1+ edema of the pretibial area. Abdominal:     General: There is no distension.     Palpations: Abdomen is soft.        Assessment and Plan:   Assessment & Plan Heart failure with  mildly reduced ejection fraction (HFmrEF, 41-49%) (HCC) EF 45-50 by echocardiogram in April 2024.  He had evidence of volume excess on his cardiac catheterization in November 2024.  He developed significant bladder incontinence after his prostatectomy in June 2024.  He has had difficulty tolerating his medications because of this.  He recently stopped his medications and then resume them after his weight increased 235 and he had uncontrolled blood pressure.  He is taking all of his medications at night including Farxiga , spironolactone , Entresto  and metoprolol  succinate.  He may better tolerate taking valsartan  without sacubitril .  We can give this once daily and hopefully control his blood pressure. -Continue Farxiga  10 mg daily, metoprolol  succinate 50 mg daily, spironolactone  25 mg daily -DC Entresto  -Start valsartan  160 mg daily -BMET today -Consider increasing valsartan  to 320 mg daily if blood pressure uncontrolled. -Follow-up 3-4 months.  Nonrheumatic aortic valve stenosis Mild aortic stenosis with gradient of 10 mmHg on pullback during his recent cardiac catheterization.  Continue annual echocardiograms for surveillance.  Coronary artery disease involving native coronary artery of native heart without angina pectoris Mild nonobstructive CAD by cardiac catheterization in November 2024.  He is doing well without chest discomfort to suggest angina.   - Continue ASA 81 mg daily, Crestor  20 mg daily.  Essential hypertension Uncontrolled. Blood pressure fluctuating between 125/83 and 192/89. Entresto  taken once daily instead of twice may be contributing to elevated readings. Discussed switching to valsartan  160 mg once daily to improve control and reduce urination issues. - DC Entresto   - Start Valsartan  160 mg once daily in the morning - Monitor blood pressure and report readings in one week - Check BMET today - Continue Metoprolol  succinate 50 mg once daily - Consider Hydralazine , Nitrates  if BP remains difficult to control. Ascending aorta dilation (HCC) 42 mm on echocardiogram April 2024.  Plan annual echocardiograms for surveillance.         Dispo:  Return in about 4 months (around 03/26/2024) for Routine Follow Up, w/ Dr. Jeffrie, or Glendia Ferrier, PA-C.  Signed, Glendia Ferrier, PA-C

## 2023-11-27 ENCOUNTER — Encounter: Payer: Self-pay | Admitting: Physician Assistant

## 2023-11-27 ENCOUNTER — Ambulatory Visit: Payer: Medicare Other | Attending: Physician Assistant | Admitting: Physician Assistant

## 2023-11-27 VITALS — BP 170/80 | HR 72 | Ht 71.0 in | Wt 229.2 lb

## 2023-11-27 DIAGNOSIS — I2510 Atherosclerotic heart disease of native coronary artery without angina pectoris: Secondary | ICD-10-CM | POA: Diagnosis not present

## 2023-11-27 DIAGNOSIS — I10 Essential (primary) hypertension: Secondary | ICD-10-CM

## 2023-11-27 DIAGNOSIS — I77810 Thoracic aortic ectasia: Secondary | ICD-10-CM | POA: Diagnosis present

## 2023-11-27 DIAGNOSIS — I5020 Unspecified systolic (congestive) heart failure: Secondary | ICD-10-CM | POA: Diagnosis present

## 2023-11-27 DIAGNOSIS — I350 Nonrheumatic aortic (valve) stenosis: Secondary | ICD-10-CM

## 2023-11-27 MED ORDER — VALSARTAN 160 MG PO TABS: 160.0000 mg | ORAL_TABLET | Freq: Every day | ORAL | 1 refills | Status: DC

## 2023-11-27 NOTE — Assessment & Plan Note (Signed)
 Mild nonobstructive CAD by cardiac catheterization in November 2024.  He is doing well without chest discomfort to suggest angina.   - Continue ASA 81 mg daily, Crestor 20 mg daily.

## 2023-11-27 NOTE — Assessment & Plan Note (Signed)
 Mild aortic stenosis with gradient of 10 mmHg on pullback during his recent cardiac catheterization.  Continue annual echocardiograms for surveillance.

## 2023-11-27 NOTE — Assessment & Plan Note (Signed)
 42 mm on echocardiogram April 2024.  Plan annual echocardiograms for surveillance.

## 2023-11-27 NOTE — Assessment & Plan Note (Signed)
 Uncontrolled. Blood pressure fluctuating between 125/83 and 192/89. Entresto  taken once daily instead of twice may be contributing to elevated readings. Discussed switching to valsartan  160 mg once daily to improve control and reduce urination issues. - DC Entresto   - Start Valsartan  160 mg once daily in the morning - Monitor blood pressure and report readings in one week - Check BMET today - Continue Metoprolol  succinate 50 mg once daily - Consider Hydralazine , Nitrates if BP remains difficult to control.

## 2023-11-27 NOTE — Patient Instructions (Signed)
 Medication Instructions:  Your physician has recommended you make the following change in your medication:  STOP Entresto   START Valsartan  160 mg taking 1 daily  *If you need a refill on your cardiac medications before your next appointment, please call your pharmacy*   Lab Work: TODAY:  BMET  If you have labs (blood work) drawn today and your tests are completely normal, you will receive your results only by: MyChart Message (if you have MyChart) OR A paper copy in the mail If you have any lab test that is abnormal or we need to change your treatment, we will call you to review the results.   Testing/Procedures: None ordered   Follow-Up: At [facility], you and your health needs are our priority.  As part of our continuing mission to provide you with exceptional heart care, we have created designated Provider Care Teams.  These Care Teams include your primary Cardiologist (physician) and Advanced Practice Providers (APPs -  Physician Assistants and Nurse Practitioners) who all work together to provide you with the care you need, when you need it.  We recommend signing up for the patient portal called MyChart.  Sign up information is provided on this After Visit Summary.  MyChart is used to connect with patients for Virtual Visits (Telemedicine).  Patients are able to view lab/test results, encounter notes, upcoming appointments, etc.  Non-urgent messages can be sent to your provider as well.   To learn more about what you can do with MyChart, go to [email or web address].    Your next appointment:   3 month(s)  Provider:   Oneil Parchment, MD     Other Instructions

## 2023-11-27 NOTE — Assessment & Plan Note (Signed)
 EF 45-50 by echocardiogram in April 2024.  He had evidence of volume excess on his cardiac catheterization in November 2024.  He developed significant bladder incontinence after his prostatectomy in June 2024.  He has had difficulty tolerating his medications because of this.  He recently stopped his medications and then resume them after his weight increased 235 and he had uncontrolled blood pressure.  He is taking all of his medications at night including Farxiga , spironolactone , Entresto  and metoprolol  succinate.  He may better tolerate taking valsartan  without sacubitril .  We can give this once daily and hopefully control his blood pressure. -Continue Farxiga  10 mg daily, metoprolol  succinate 50 mg daily, spironolactone  25 mg daily -DC Entresto  -Start valsartan  160 mg daily -BMET today -Consider increasing valsartan  to 320 mg daily if blood pressure uncontrolled. -Follow-up 3-4 months.

## 2023-11-28 ENCOUNTER — Other Ambulatory Visit: Payer: Self-pay

## 2023-11-28 DIAGNOSIS — I5020 Unspecified systolic (congestive) heart failure: Secondary | ICD-10-CM

## 2023-11-28 DIAGNOSIS — I10 Essential (primary) hypertension: Secondary | ICD-10-CM

## 2023-11-28 LAB — BASIC METABOLIC PANEL
BUN/Creatinine Ratio: 27 — ABNORMAL HIGH (ref 10–24)
BUN: 29 mg/dL — ABNORMAL HIGH (ref 8–27)
CO2: 25 mmol/L (ref 20–29)
Calcium: 9.4 mg/dL (ref 8.6–10.2)
Chloride: 106 mmol/L (ref 96–106)
Creatinine, Ser: 1.06 mg/dL (ref 0.76–1.27)
Glucose: 284 mg/dL — ABNORMAL HIGH (ref 70–99)
Potassium: 4.4 mmol/L (ref 3.5–5.2)
Sodium: 145 mmol/L — ABNORMAL HIGH (ref 134–144)
eGFR: 74 mL/min/{1.73_m2} (ref >59)

## 2023-11-28 NOTE — Addendum Note (Signed)
 Addended byAlben Spittle, Lorin Picket T on: 11/28/2023 11:00 AM   Modules accepted: Orders

## 2023-12-03 ENCOUNTER — Encounter: Payer: Self-pay | Admitting: Internal Medicine

## 2023-12-03 ENCOUNTER — Ambulatory Visit (INDEPENDENT_AMBULATORY_CARE_PROVIDER_SITE_OTHER): Payer: Medicare Other | Admitting: Internal Medicine

## 2023-12-03 VITALS — BP 140/70 | HR 77 | Ht 71.0 in | Wt 230.8 lb

## 2023-12-03 DIAGNOSIS — E785 Hyperlipidemia, unspecified: Secondary | ICD-10-CM | POA: Diagnosis not present

## 2023-12-03 DIAGNOSIS — Z7984 Long term (current) use of oral hypoglycemic drugs: Secondary | ICD-10-CM | POA: Diagnosis not present

## 2023-12-03 DIAGNOSIS — L97519 Non-pressure chronic ulcer of other part of right foot with unspecified severity: Secondary | ICD-10-CM | POA: Diagnosis not present

## 2023-12-03 DIAGNOSIS — E1159 Type 2 diabetes mellitus with other circulatory complications: Secondary | ICD-10-CM

## 2023-12-03 DIAGNOSIS — Z794 Long term (current) use of insulin: Secondary | ICD-10-CM | POA: Diagnosis not present

## 2023-12-03 DIAGNOSIS — E1165 Type 2 diabetes mellitus with hyperglycemia: Secondary | ICD-10-CM

## 2023-12-03 LAB — POCT GLYCOSYLATED HEMOGLOBIN (HGB A1C): Hemoglobin A1C: 8.1 % — AB (ref 4.0–5.6)

## 2023-12-03 NOTE — Patient Instructions (Addendum)
 Please use the following regimen: - Farxiga  10 mg but take it before dinner - Tresiba  90 units daily  Change: - Lyumjev  6-10 units 3x a day before meals  Check some sugars later in the day - midday and bedtime.  Try to see [facility]  Address: [address] [place], [place], [place] [address] Phone: [phone]  Please return in 3 months with your sugar log.

## 2023-12-03 NOTE — Progress Notes (Addendum)
 Patient ID: Matthew Schmitt, male   DOB: Feb 22, 1951, 73 y.o.   MRN: [id number]  HPI: [name] is a 73 y.o.-year-old male, returning for follow-up for DM2, dx in 2002, insulin -dependent since 2017, uncontrolled, with complications (CAD, nonischemic cardiomyopathy, atrial flutter/PAF). Pt. previously saw Dr. Kassie, but last visit with me 5 months ago.  Interim history: No blurry vision, nausea, chest pain.  Before last visit, he was dx'ed with Pr CA. He had a robotic radical prostatectomy 04/22/2023.  He has bladder spasms and incontinence. He is also on a diuretic >> has increased urination.  Reviewed HbA1c: Lab Results  Component Value Date   HGBA1C 8.6 (A) 07/11/2023   HGBA1C 7.6 (H) 04/10/2023   HGBA1C 7.6 (A) 01/30/2023   HGBA1C 8.7 (A) 11/15/2022   HGBA1C 7.4 (H) 06/13/2022   HGBA1C 7.9 (H) 05/04/2022   HGBA1C 8.6 (H) 04/06/2022   HGBA1C 8.5 (H) 03/06/2022   HGBA1C 8.0 (A) 01/05/2022   HGBA1C 6.4 (A) 08/04/2021   At last visit, he was on: - Farxiga  10 mg in am - Rybelsus  3 mg before b'fast - added 06/2022 >> stopped b/c no refills - Basaglar  90 units at bedtime He developed cough and edema from Actos. He tried Metformin  >> gained weight.  He tried Trulicity  before  - no GI sxs.  We changed to: - Tresiba  90 units daily - Farxiga  10 mg in am >> in the evening - Lyumjev  6 to 10 units before each meal -started 06/2023 Previously on Ozempic  0.5 but stopped due to price and did not restart as he did not feel that this was helping too much.  Pt checks his sugars 1x a day and they are: - am:108, 126-290 >> 103-172, 222 (icecream) >> 140-200 >> 131-191, 279 - 2h after b'fast: n/c >> 155 >> n/c - before lunch: n/c - 2h after lunch: n/c - before dinner: n/c - 2h after dinner: n/c - bedtime: n/c - nighttime: n/c Lowest sugar was 22  - years ago; more recently: 103 >> 140 >> 131 Highest sugar was 312 >> ... 279  Glucometer: One Touch ultra  - + Mild CKD, last  BUN/creatinine:  Lab Results  Component Value Date   BUN 29 (H) 11/27/2023   BUN 18 09/23/2023   CREATININE 1.06 11/27/2023   CREATININE 1.06 09/23/2023   Lab Results  Component Value Date   MICRALBCREAT 17.4 09/20/2014  On Entresto .  -+ HL; last set of lipids: Lab Results  Component Value Date   CHOL 114 09/07/2022   HDL 36 (L) 09/07/2022   LDLCALC 57 09/07/2022   TRIG 112 09/07/2022   CHOLHDL 3.2 09/07/2022  04/28/2022: 127/86/38/72 He is on Crestor  20 mg daily.  - last eye exam was on 04/10/2023. No DR.  - no numbness and tingling in his feet.  Last foot exam 11/15/2022.  He also has a history of nephrolithiasis, right bundle branch block. He also has atrial flutter.  ROS: + see HPI  Past Medical History:  Diagnosis Date   Anxiety    Arthritis    everywhere   Arthrosis of right acromioclavicular joint 01/29/2020   Atrial flutter [facility])    Onset May 2014 Cardioversion done    BPH associated with nocturia    BPH with obstruction/lower urinary tract symptoms 01/04/2017   CAD (coronary artery disease) 04/02/2013   Cath 2012 moderate mid LAD lesion, otherwise not much disease    Cancer (HCC)    Diabetes (HCC) 01/18/2018   Dilated  aortic root (HCC)    aortic root 44 mm, ascending aorta 46 mm 12/22/21 echo   Encounter for long-term (current) use of other medications 07/27/2013   Essential hypertension, benign    First degree heart block    Heart failure with mildly reduced ejection fraction (HFmrEF, 41-49%) (HCC) 08/18/2021   Hx of tachycardia mediated cardiomyopathy  TTE 12/27/2020: EF 20-25 TTE 05/10/2021: EF 30-35 TTE 12/22/2021: EF 50-55 (after CTI ablation) TTE 02/19/2023: EF 45-50, global HK, GR 2 DD, normal RVSF, severe LAE, mild MR, mild-moderate AI, moderate aortic stenosis (V-max 300 cm/s, mean gradient 19 mmHg, DI 0.33), ascending aorta 4.2 cm, RAP 3    Heart murmur    mild-moderate AS and AR 01/11/22   History of bilateral knee replacement 02/07/2017    History of bladder stone    History of kidney stones    History of melanoma excision    June 2016  --- s/p  excision right  leg (per pt localized and no recurrence)   Hypertensive heart disease 04/02/2013   Impingement syndrome of right shoulder 01/29/2020   Impotence of organic origin    Left-sided low back pain with left-sided sciatica 01/23/2019   Long term current use of anticoagulant therapy    Mixed hyperlipidemia    slightly   Multiple renal cysts    bilateral   Nephrolithiasis 05/17/2014   Nonischemic cardiomyopathy (HCC)    Osteoarthritis    PAF (paroxysmal atrial fibrillation) (HCC)    followed by cardiology  (takes ASA)   Peripheral vascular disease (HCC)    RBBB    RBBB (right bundle branch block with left anterior fascicular block) 04/02/2013   Renal oncocytoma of left kidney 06/18/2013   s/p left robotic assited laparoscopic partial nephrectomy 06/18/13   Right ureteral stone    Rotator cuff syndrome of left shoulder 04/17/2016   Status post total left knee replacement 01/01/2020   Tendinopathy of right biceps tendon 01/29/2020   Tendinopathy of right rotator cuff 01/29/2020   Type 2 diabetes mellitus treated with insulin  [facility])    endocrinologist-  dr kassie   Wears glasses    Past Surgical History:  Procedure Laterality Date   A-FLUTTER ABLATION N/A 10/27/2021   Procedure: A-FLUTTER ABLATION;  Surgeon: Cindie Ole DASEN, MD;  Location: [facility] INVASIVE CV LAB;  Service: Cardiovascular;  Laterality: N/A;   CARDIAC CATHETERIZATION  08-17-2011  DR JACQUES TILLEY   POSSIBLE MODERATE LAD STENOSIS JUST AFTER THE BIFURCATION OF LARGE DIAGONAL BRANCH/ LVEF  40-45%/ MILD GLOBAL HYPODINESIS (CATH DONE FOR ABNORMAL STRESS TEST OF FIXED LATERAL WALL DEFECT & BIFASCICULAR BLAOCK)   CARDIOVERSION N/A 04/02/2013   Procedure: CARDIOVERSION;  Surgeon: Elsie GORMAN Somerset, MD;  Location[facility] OR;  Service: Cardiovascular;  Laterality: N/A;   CARDIOVERSION N/A 12/27/2020   Procedure:  CARDIOVERSION;  Surgeon: Raford Riggs, MD;  Location: [facility] ENDOSCOPY;  Service: Cardiovascular;  Laterality: N/A;   CYSTOSCOPY WITH LITHOLAPAXY N/A 05/18/2015   Procedure: CYSTOSCOPY WITH LITHOLAPAXY;  Surgeon: Alm Fragmin, MD;  Location: [facility];  Service: Urology;  Laterality: N/A;   CYSTOSCOPY WITH LITHOLAPAXY N/A 01/04/2017   Procedure: CYSTOSCOPY WITH LITHOLAPAXY, Holmium Laser;  Surgeon: Morene LELON Salines, MD;  Location: [facility] ORS;  Service: Urology;  Laterality: N/A;   CYSTOSCOPY WITH RETROGRADE PYELOGRAM, URETEROSCOPY AND STENT PLACEMENT  11/03/2012   Procedure: CYSTOSCOPY WITH RETROGRADE PYELOGRAM, URETEROSCOPY AND STENT PLACEMENT;  Surgeon: Alm GORMAN Fragmin, MD;  Location: [facility];  Service: Urology;  Laterality: Left;  Cystoscopy/Left  Retrograde/Ureteroscopy/Holmium Laser Litho/Double J Stent   CYSTOSCOPY WITH URETEROSCOPY, STONE BASKETRY AND STENT PLACEMENT Right 05/15/2019   Procedure: CYSTOSCOPY WITH URETEROSCOPY, LASER LITHOTRIPSY STONE BASKETRY AND STENT PLACEMENT, RIGHT RETROGRADE;  Surgeon: Cam Morene ORN, MD;  Location: [facility];  Service: Urology;  Laterality: Right;   HOLMIUM LASER APPLICATION  11/03/2012   Procedure: HOLMIUM LASER APPLICATION;  Surgeon: Alm GORMAN Fragmin, MD;  Location: [facility];  Service: Urology;  Laterality: Left;   HOLMIUM LASER APPLICATION N/A 05/18/2015   Procedure: HOLMIUM LASER APPLICATION;  Surgeon: Alm Fragmin, MD;  Location: [facility];  Service: Urology;  Laterality: N/A;   KNEE SURGERY Bilateral 1978  &  1974   LYMPHADENECTOMY Bilateral 04/22/2023   Procedure: BILATERAL PELVIC LYMPHADENECTOMY;  Surgeon: Renda Glance, MD;  Location: [facility];  Service: Urology;  Laterality: Bilateral;   MELANOMA EXCISION Bilateral 04/2015   20th-- left leg //  1st-- right leg w/ skin graft from right thigh (no lymph node bx)   NEPHROLITHOTOMY Right 05/17/2014   Procedure:  NEPHROLITHOTOMY PERCUTANEOUS;  Surgeon: Alm GORMAN Fragmin, MD;  Location: [facility];  Service: Urology;  Laterality: Right;   PILONIDAL CYST EXCISION     PROSTATE BIOPSY     RIGHT/LEFT HEART CATH AND CORONARY ANGIOGRAPHY N/A 09/27/2023   Procedure: RIGHT/LEFT HEART CATH AND CORONARY ANGIOGRAPHY;  Surgeon: Jordan, Peter M, MD;  Location: [facility] INVASIVE CV LAB;  Service: Cardiovascular;  Laterality: N/A;   ROBOT ASSISTED LAPAROSCOPIC RADICAL PROSTATECTOMY N/A 04/22/2023   Procedure: XI ROBOTIC ASSISTED LAPAROSCOPIC RADICAL PROSTATECTOMY LEVEL 2;  Surgeon: Renda Glance, MD;  Location: [facility];  Service: Urology;  Laterality: N/A;   ROBOTIC ASSITED PARTIAL NEPHRECTOMY Left 06/18/2013   Procedure: ROBOTIC ASSISTED PARTIAL NEPHRECTOMY;  Surgeon: Noretta Renda, MD;  Location: [facility];  Service: Urology;  Laterality: Left;   ROTATOR CUFF REPAIR Left 07/2016   SHOULDER ARTHROSCOPY WITH ROTATOR CUFF REPAIR AND SUBACROMIAL DECOMPRESSION Right 05/27/2020   Procedure: RIGHT SHOULDER ARTHROSCOPY WITH EXTENSIVE DEBRIDEMENT, DISTAL CLAVICLE EXCISION AND SUBACROMIAL DECOMPRESSION;  Surgeon: Jerri Kay HERO, MD;  Location: [facility];  Service: Orthopedics;  Laterality: Right;   TEE WITH CARDIOVERSION  12/27/2020   TEE WITHOUT CARDIOVERSION N/A 12/27/2020   Procedure: TRANSESOPHAGEAL ECHOCARDIOGRAM (TEE);  Surgeon: Raford Riggs, MD;  Location: [facility] ENDOSCOPY;  Service: Cardiovascular;  Laterality: N/A;   TONSILLECTOMY  65  (age 22)   TOTAL KNEE ARTHROPLASTY Left 02/07/2017   Procedure: LEFT TOTAL KNEE ARTHROPLASTY;  Surgeon: Kay HERO Jerri, MD;  Location: [facility] OR;  Service: Orthopedics;  Laterality: Left;   TOTAL KNEE ARTHROPLASTY Right 04/25/2017   Procedure: RIGHT TOTAL KNEE ARTHROPLASTY;  Surgeon: Jerri Kay HERO, MD;  Location: [facility] OR;  Service: Orthopedics;  Laterality: Right;   TOTAL KNEE REVISION Left 07/13/2022   Procedure: LEFT TOTAL KNEE REVISION, TIBIAL COMPONENT;  Surgeon: Jerri Kay HERO, MD;  Location: [facility]  OR;  Service: Orthopedics;  Laterality: Left;   TRANSURETHRAL RESECTION OF PROSTATE N/A 01/04/2017   Procedure: TRANSURETHRAL RESECTION OF THE PROSTATE (TURP);  Surgeon: Morene ORN Cam, MD;  Location: [facility];  Service: Urology;  Laterality: N/A;   Social History   Socioeconomic History   Marital status: Married    Spouse name: Not on file   Number of children: 2   Years of education: 16   Highest education level: Not on file  Occupational History   Occupation: Emergency Planning/management Officer, Mining Engineer  Tobacco Use   Smoking status: Former    Current  packs/day: 0.00    Average packs/day: 1 pack/day for 6.0 years (6.0 ttl pk-yrs)    Types: Cigarettes    Start date: 11/01/1963    Quit date: 10/31/1969    Years since quitting: 54.1   Smokeless tobacco: Never  Vaping Use   Vaping status: Never Used  Substance and Sexual Activity   Alcohol use: Yes    Alcohol/week: 0.0 standard drinks of alcohol    Comment: OCCASIONAL   Drug use: No   Sexual activity: Never  Other Topics Concern   Not on file  Social History Narrative   Regular exercise-no   Social Drivers of Health   Financial Resource Strain: Not on file  Food Insecurity: No Food Insecurity (04/22/2023)   Hunger Vital Sign    Worried About Running Out of Food in the Last Year: Never true    Ran Out of Food in the Last Year: Never true  Transportation Needs: No Transportation Needs (04/22/2023)   PRAPARE - Administrator, Civil Service (Medical): No    Lack of Transportation (Non-Medical): No  Physical Activity: Not on file  Stress: Not on file  Social Connections: Not on file  Intimate Partner Violence: Not At Risk (04/22/2023)   Humiliation, Afraid, Rape, and Kick questionnaire    Fear of Current or Ex-Partner: No    Emotionally Abused: No    Physically Abused: No    Sexually Abused: No   Current Outpatient Medications on File Prior to Visit  Medication Sig Dispense Refill   aspirin  EC 81 MG tablet Take 1  tablet (81 mg total) by mouth daily. Swallow whole.     dapagliflozin  propanediol (FARXIGA ) 10 MG TABS tablet Take 1 tablet (10 mg total) by mouth daily before breakfast. 90 tablet 3   Insulin  Lispro-aabc (LYUMJEV  KWIKPEN) 200 UNIT/ML KwikPen Inject 6-10 units before meals, under skin 18 mL 3   Insulin  Pen Needle 32G X 4 MM MISC Use 4x a day 300 each 3   Lancets (ONETOUCH DELICA PLUS LANCET33G) MISC 1 each by Other route 2 (two) times daily. E11.9 200 each 3   metoprolol  succinate (TOPROL -XL) 50 MG 24 hr tablet TAKE 1 TABLET DAILY, TAKE WITH OR IMMEDIATELY FOLLOWING A MEAL 90 tablet 3   nitroGLYCERIN  (NITROSTAT ) 0.4 MG SL tablet Place 0.4 mg under the tongue every 5 (five) minutes x 3 doses as needed for chest pain.     ONETOUCH ULTRA test strip USE 1 STRIP TWICE A DAY 200 strip 3   rosuvastatin  (CRESTOR ) 20 MG tablet TAKE 1 TABLET DAILY 90 tablet 3   spironolactone  (ALDACTONE ) 25 MG tablet Take 1 tablet (25 mg total) by mouth daily.     TRESIBA  FLEXTOUCH 200 UNIT/ML FlexTouch Pen INJECT 90 UNITS UNDER THE SKIN DAILY 45 mL 3   trolamine salicylate (ASPERCREME) 10 % cream Apply 1 application  topically 3 (three) times daily as needed for muscle pain.     valsartan  (DIOVAN ) 160 MG tablet Take 1 tablet (160 mg total) by mouth daily. 30 tablet 1   No current facility-administered medications on file prior to visit.   Allergies  Allergen Reactions   Actos [Pioglitazone] Swelling and Cough    SWELLING REACTION UNSPECIFIED  EDEMA   Family History  Problem Relation Age of Onset   Arthritis Mother    Hypertension Mother    Arthritis Father    Hypertension Father    Diabetes Father    Heart attack Brother 79   Diabetes Brother  Cancer Maternal Uncle        unknown type   Prostate cancer Paternal Uncle 63 - 69       metastatic   PE: BP (!) 140/70   Pulse 77   Ht 5' 11 (1.803 m)   Wt 230 lb 12.8 oz (104.7 kg)   SpO2 98%   BMI 32.19 kg/m  Wt Readings from Last 10 Encounters:   12/03/23 230 lb 12.8 oz (104.7 kg)  11/27/23 229 lb 3.2 oz (104 kg)  10/02/23 233 lb 9.6 oz (106 kg)  09/27/23 230 lb (104.3 kg)  09/23/23 230 lb 9.6 oz (104.6 kg)  09/12/23 229 lb (103.9 kg)  07/11/23 229 lb (103.9 kg)  05/17/23 226 lb 9.6 oz (102.8 kg)  04/22/23 225 lb (102.1 kg)  04/10/23 229 lb (103.9 kg)   Constitutional: overweight, in NAD Eyes: no exophthalmos ENT: no thyromegaly, no cervical lymphadenopathy Cardiovascular: RRR, No RG, + 1/6 SEM, + L LE slight swelling Respiratory: CTA B Musculoskeletal: no deformities Skin: + rashes - stasis dermatitis on B legs Neurological: no tremor with outstretched hands    ASSESSMENT: 1. DM2, insulin -dependent, uncontrolled, with complications - CAD - NICMP - mild CKD  No family history of medullary thyroid  cancer or personal history of pancreatitis.  2. HL  3.  Toe ulcer  PLAN:  1. Patient with longstanding, uncontrolled, type 2 diabetes, on oral antidiabetic regimen with SGLT2 inhibitor and also long-acting insulin  to which we added ultra-rapid acting insulin  at last visit.  At last visit, he was off Ozempic  and did not want to restart due to price but also as he did not feel that this was helping.  We discussed about the multiple benefits of Ozempic  including cardiovascular and renal but he declined it.  In that case, I suggested to continue Tresiba  and Farxiga  and to start Lyumjev  before each meal.  I advised him to vary the dose of Lyumjev  based on the size and consistency of his meals.  He was missing Tresiba  doses and I strongly advised him to try to take it daily.  I did advise him about potential weight gain with adding more insulin  and advised him to try to improve diet.  HbA1c at that time was higher, at 8.6%, increased from 7.6%. - At today's visit, he unfortunately still only check his blood sugars in the morning and they are only slightly improved from before.  This morning, his blood sugars were very high, in the  upper 200s after he had chocolate cake the night before.  He did not increase the dose of Lyumjev  for this and upon questioning, he is actually using a fixed dose of 8 units before each meal.  We discussed about the importance of varying the dose based on the size and consistency of the meals and given specific examples.  Also, he is taking Farxiga  in the evening due to urinary incontinence if he takes it in the morning.  I advised him to at least take it before dinner.  Will continue the same dose of Tresiba  for now.  I strongly advised him to check some sugars later in the day since otherwise it would be impossible to adjust the Lyumjev  doses. - I suggested to:  Patient Instructions  Please use the following regimen: - Farxiga  10 mg but take it before dinner - Tresiba  90 units daily  Change: - Lyumjev  6-10 units 3x a day before meals  Check some sugars later in the day - midday and  bedtime.  Try to see [facility]  Address: [address] [place], [place], [place] [address] Phone: [phone]  Please return in 3 months with your sugar log.    - we checked his HbA1c: 8.1% (improved) - advised to check sugars at different times of the day - 4x a day, rotating check times - advised for yearly eye exams >> he is UTD - will check an ACR today - return to clinic in 3 months  2. HL -Latest lipid panel was reviewed from 08/2022: LDL close to goal of less than 55, HDL slightly low: Lab Results  Component Value Date   CHOL 114 09/07/2022   HDL 36 (L) 09/07/2022   LDLCALC 57 09/07/2022   TRIG 112 09/07/2022   CHOLHDL 3.2 09/07/2022  -He continues Crestor  20 mg daily without side effects -He is due for another lipid panel-will check today (fasting)  3.  Toe ulcer -Left second toe -dorsal aspect -He mentions that he hit his toe several times in the same place -He does have good pedal pulses, but he has significant venous stasis in his feet. -His nails are dystrophic and he is skin is shiny  and dystrophic appearing -I recommended a podiatry visit as soon as possible.  Given address and telephone number for [facility]  Component     Latest Ref Rng 12/03/2023  Hemoglobin A1C     4.0 - 5.6 % 8.1 !   Triglycerides     <150 mg/dL 79   HDL Cholesterol     > OR = 40 mg/dL 49   Total CHOL/HDL Ratio     <5.0 (calc) 2.9   Cholesterol     <200 mg/dL 856   LDL Cholesterol (Calc)     mg/dL (calc) 78   Non-HDL Cholesterol (Calc)     <130 mg/dL (calc) 94   Labs are at goal with the exception of an increased LDL above our target of less than 55.  Will check with him if he is taking the Crestor  20 mg consistently. He was not able to give a urine sample -will check at next visit.  Lela Fendt, MD PhD [facility] Endocrinology

## 2023-12-04 LAB — LIPID PANEL W/REFLEX DIRECT LDL
Cholesterol: 143 mg/dL (ref <200)
HDL: 49 mg/dL (ref >=40)
LDL Cholesterol (Calc): 78 mg/dL
Non-HDL Cholesterol (Calc): 94 mg/dL (ref <130)
Total CHOL/HDL Ratio: 2.9 (calc) (ref <5.0)
Triglycerides: 79 mg/dL (ref <150)

## 2023-12-06 ENCOUNTER — Ambulatory Visit: Payer: BLUE CROSS/BLUE SHIELD | Admitting: Physician Assistant

## 2023-12-12 LAB — BASIC METABOLIC PANEL
BUN/Creatinine Ratio: 17 (ref 10–24)
BUN: 17 mg/dL (ref 8–27)
CO2: 25 mmol/L (ref 20–29)
Calcium: 9.6 mg/dL (ref 8.6–10.2)
Chloride: 105 mmol/L (ref 96–106)
Creatinine, Ser: 1.02 mg/dL (ref 0.76–1.27)
Glucose: 110 mg/dL — ABNORMAL HIGH (ref 70–99)
Potassium: 4.7 mmol/L (ref 3.5–5.2)
Sodium: 145 mmol/L — ABNORMAL HIGH (ref 134–144)
eGFR: 78 mL/min/{1.73_m2} (ref >59)

## 2023-12-16 NOTE — Progress Notes (Signed)
Pt has been made aware of normal result and verbalized understanding.  jw

## 2023-12-31 ENCOUNTER — Telehealth: Payer: Self-pay | Admitting: Internal Medicine

## 2023-12-31 NOTE — Telephone Encounter (Signed)
MEDICATION:  OneTouch Ultra ONETOUCH ULTRA test strip  PHARMACY:    [facility] - [place], [place] - [address] (Ph: [phone])    HAS THE PATIENT CONTACTED THEIR PHARMACY?  Yes  IS THIS A 90 DAY SUPPLY : Yes  IS PATIENT OUT OF MEDICATION: No  IF NOT; HOW MUCH IS LEFT: 1  LAST APPOINTMENT DATE: @1 /14/2025  NEXT APPOINTMENT DATE:@4 /25/2025  DO WE HAVE YOUR PERMISSION TO LEAVE A DETAILED MESSAGE?: Yes  OTHER COMMENTS:    **Let patient know to contact pharmacy at the end of the day to make sure medication is ready. **  ** Please notify patient to allow 48-72 hours to process**  **Encourage patient to contact the pharmacy for refills or they can request refills through [facility]**

## 2024-01-01 ENCOUNTER — Other Ambulatory Visit: Payer: Self-pay

## 2024-01-01 MED ORDER — ONETOUCH ULTRA VI STRP: ORAL_STRIP | 5 refills | Status: DC

## 2024-01-01 NOTE — Telephone Encounter (Signed)
Requested Prescriptions   Signed Prescriptions Disp Refills   glucose blood (ONETOUCH ULTRA) test strip 200 strip 5    Sig: Use as instructed    Authorizing Provider: Carlus Pavlov    Ordering User: Pollie Meyer

## 2024-01-29 NOTE — Progress Notes (Deleted)
 Cardiology Office Note:  .   Date:  01/29/2024  ID:  Matthew Schmitt, DOB [birth year], MRN [id number] PCP: Practice, [facility]  Matthew Schmitt Providers Cardiologist:  Matthew Schultz, MD Electrophysiologist:  Matthew Prude, MD {  History of Present Illness: .   Matthew Schmitt is a 73 y.o. male w/PMHx of  DM, HTN, HLD, prostate ca CAD (NOD by cath below) Bicuspid AV RBBB HFrEF (suspect tachy-mediated) AFlutter > ablated  Last saw Dr. Lalla Schmitt 01/22/23, OAC stopped without recurrent arrhythmia, discussed ILR, monitoring, pt was going to consider investing in a Kardia  Saw AFib clinic 05/17/23, this in f/u of post-op (prostatectomy) EKG reported to be Afib, no reported symptoms, since then the pt denied any palpitations, daily pulse ox checks with normal HRs,  In Matthew Schmitt's review, EKG SR w/PACs and not c/w Afib Home Toprol resumed Not felt to have indication to resume OAC. Discussed monitoring strategies, the patient planned to hold off and continue as he was with the pulse ox it seems LVEF 45-50%, advised to also resume his [employer] off on Farxiga and sprio until his urinary incontinence post-op resolved  Saw Dr. Anne Schmitt 09/23/23, abnormal stress test discussed and planned for cath  >> NOD noted, cath below  Saw Matthew Schmitt a couple times, last 11/27/23, HF meds adjusted in effort to better tolerate (off Entresto > valsartan)  Today's visit is scheduled as a 6 mo visit  ROS:   *** symptoms, palps *** volume with gen cards.... *** monitoring tech?   Arrhythmia/AAD hx AFlutter > ablated Dec 2022 Amiodarone *** 2  Studies Reviewed: Marland Kitchen    EKG not done today  04/22/23: personally reviewed: agree, not AFib > SB 59, PACs, broad RBBB, LAD   09/27/23: R/LHC   Prox LAD to Mid LAD lesion is 20% stenosed.   Prox Cx to Mid Cx lesion is 25% stenosed.   1st Mrg lesion is 25% stenosed.   Prox RCA to Mid RCA lesion is 20% stenosed.   LV end diastolic pressure is  mildly elevated.   Hemodynamic findings consistent with mild pulmonary hypertension.   Codominant circulation with mild nonobstructive CAD Elevated LV filling pressures. LVEDP 23 mm Hg. PCWP 23/27, mean 19 mm Hg Mild pulmonary HTN. PAP 42/18 mean 28 mm Hg. RA pressure 17/14, mean 12 mm Hg Cardiac output 5.29 L/min, index 2.37  Mild aortic stenosis. AV gradient only 10 mm Hg but pullback.    Plan: optimize medical therapy for CHF.   Echo 02/19/23   1. Left ventricular ejection fraction, by estimation, is 45 to 50%. The  left ventricle has mildly decreased function. The left ventricle  demonstrates global hypokinesis. There is mild left ventricular  hypertrophy of the basal-septal segment. Left ventricular diastolic parameters are consistent with Grade II diastolic dysfunction (pseudonormalization). Elevated left atrial pressure.   2. Right ventricular systolic function is normal. The right ventricular  size is normal. Tricuspid regurgitation signal is inadequate for assessing  PA pressure.   3. Left atrial size was severely dilated.   4. The mitral valve is normal in structure. Mild mitral valve  regurgitation. No evidence of mitral stenosis.   5. The aortic valve has an indeterminant number of cusps. There is  moderate calcification of the aortic valve. There is moderate thickening  of the aortic valve. Aortic valve regurgitation is mild to moderate.  Moderate aortic valve stenosis. Aortic regurgitation PHT measures 524 msec. Aortic valve area, by VTI measures 1.62 cm.  Aortic valve mean gradient measures 19.0 mmHg. Aortic valve Vmax measures 3.00 m/s. DVI 0.33.   6. The ascending aortic dimensions are 4.2cm and when indexed to BSA and gender, this is in the normal range.   7. The inferior vena cava is normal in size with greater than 50%  respiratory variability, suggesting right atrial pressure of 3 mmHg.   8. Compared to study dated 12/22/30, no significant change in degree of AS.     Risk Assessment/Calculations:    Physical Exam:   VS:  There were no vitals taken for this visit.   Wt Readings from Last 3 Encounters:  12/03/23 230 lb 12.8 oz (104.7 kg)  11/27/23 229 lb 3.2 oz (104 kg)  10/02/23 233 lb 9.6 oz (106 kg)    GEN: Well nourished, well developed in no acute distress NECK: No JVD; No carotid bruits CARDIAC: ***RRR, no murmurs, rubs, gallops RESPIRATORY:  *** CTA b/l without rales, wheezing or rhonchi  ABDOMEN: Soft, non-tender, non-distended EXTREMITIES: *** No edema; No deformity   ASSESSMENT AND PLAN: .    AFlutter CTI ablation 2022 ***  NICM *** C/w Dr. Lalla Schmitt  VHD Mod AI/AS ?bicuspic AV C/w Dr. Lalla Schmitt     {Are you ordering a CV Procedure (e.g. stress test, cath, DCCV, TEE, etc)?   Press F2        :[id number]}     Dispo: ***  Signed, Matthew Pigeon, PA-C

## 2024-01-31 ENCOUNTER — Ambulatory Visit: Payer: BLUE CROSS/BLUE SHIELD | Admitting: Physician Assistant

## 2024-02-16 NOTE — Progress Notes (Unsigned)
 Cardiology Office Note:  .   Date:  02/16/2024  ID:  Matthew Schmitt, DOB 03-21-1951, MRN [id number] PCP: Practice, [facility]  [facility] Providers Cardiologist:  Donato Schultz, MD Electrophysiologist:  Lanier Prude, MD {  History of Present Illness: .   Matthew Schmitt is a 73 y.o. male w/PMHx of  DM, HTN, HLD, prostate ca CAD (NOD by cath below) Bicuspid AV RBBB HFrEF (suspect tachy-mediated) AFlutter > ablated  Last saw Dr. Lalla Brothers 01/22/23, OAC stopped without recurrent arrhythmia, discussed ILR, monitoring, pt was going to consider investing in a Kardia  Saw AFib clinic 05/17/23, this in f/u of post-op (prostatectomy) EKG reported to be Afib, no reported symptoms, since then the pt denied any palpitations, daily pulse ox checks with normal HRs,  In R. Fenton's review, EKG SR w/PACs and not c/w Afib Home Toprol resumed Not felt to have indication to resume OAC. Discussed monitoring strategies, the patient planned to hold off and continue as he was with the pulse ox it seems LVEF 45-50%, advised to also resume his [employer] off on Farxiga and sprio until his urinary incontinence post-op resolved  Saw Dr. Anne Fu 09/23/23, abnormal stress test discussed and planned for cath  >> NOD noted, cath below  Saw Wende Mott a couple times, last 11/27/23, HF meds adjusted in effort to better tolerate (off Entresto > valsartan)  Today's visit is scheduled as a 6 mo visit  ROS:   Denies any palpitations, CP, cardiac awareness BP waxes/wanes though regularly skips his medicines because of urinary incontinence. Since his prostate surgery he struggles with this and feels like the medicines make it worse  Skips all of his heart/BP pills, not just the diuretic. No near syncope or syncope Gets winded with heavier activities, non otherwise   Arrhythmia/AAD hx AFlutter > ablated Dec 2022 Amiodarone 2022 >> discontinued March 2023   Studies Reviewed: Marland Kitchen    EKG  not done today  04/22/23: personally reviewed: agree, not AFib > SB 59, PACs, broad RBBB, LAD   09/27/23: R/LHC   Prox LAD to Mid LAD lesion is 20% stenosed.   Prox Cx to Mid Cx lesion is 25% stenosed.   1st Mrg lesion is 25% stenosed.   Prox RCA to Mid RCA lesion is 20% stenosed.   LV end diastolic pressure is mildly elevated.   Hemodynamic findings consistent with mild pulmonary hypertension.   Codominant circulation with mild nonobstructive CAD Elevated LV filling pressures. LVEDP 23 mm Hg. PCWP 23/27, mean 19 mm Hg Mild pulmonary HTN. PAP 42/18 mean 28 mm Hg. RA pressure 17/14, mean 12 mm Hg Cardiac output 5.29 L/min, index 2.37  Mild aortic stenosis. AV gradient only 10 mm Hg but pullback.    Plan: optimize medical therapy for CHF.   Echo 02/19/23   1. Left ventricular ejection fraction, by estimation, is 45 to 50%. The  left ventricle has mildly decreased function. The left ventricle  demonstrates global hypokinesis. There is mild left ventricular  hypertrophy of the basal-septal segment. Left ventricular diastolic parameters are consistent with Grade II diastolic dysfunction (pseudonormalization). Elevated left atrial pressure.   2. Right ventricular systolic function is normal. The right ventricular  size is normal. Tricuspid regurgitation signal is inadequate for assessing  PA pressure.   3. Left atrial size was severely dilated.   4. The mitral valve is normal in structure. Mild mitral valve  regurgitation. No evidence of mitral stenosis.   5. The aortic valve has  an indeterminant number of cusps. There is  moderate calcification of the aortic valve. There is moderate thickening  of the aortic valve. Aortic valve regurgitation is mild to moderate.  Moderate aortic valve stenosis. Aortic regurgitation PHT measures 524 msec. Aortic valve area, by VTI measures 1.62 cm. Aortic valve mean gradient measures 19.0 mmHg. Aortic valve Vmax measures 3.00 m/s. DVI 0.33.   6. The  ascending aortic dimensions are 4.2cm and when indexed to BSA and gender, this is in the normal range.   7. The inferior vena cava is normal in size with greater than 50%  respiratory variability, suggesting right atrial pressure of 3 mmHg.   8. Compared to study dated 12/22/30, no significant change in degree of AS.    Risk Assessment/Calculations:    Physical Exam:   VS:  There were no vitals taken for this visit.   Wt Readings from Last 3 Encounters:  12/03/23 230 lb 12.8 oz (104.7 kg)  11/27/23 229 lb 3.2 oz (104 kg)  10/02/23 233 lb 9.6 oz (106 kg)    GEN: Well nourished, well developed in no acute distress NECK: No JVD; No carotid bruits CARDIAC: RRR, 2-3/6 SM, rubs, gallops RESPIRATORY:   CTA b/l without rales, wheezing or rhonchi  ABDOMEN: Soft, non-tender, non-distended EXTREMITIES:  chronic skin changes b/l LE, firm, darkened, (he reports unchanged for years, follows with dermatology); No deformity   ASSESSMENT AND PLAN: .    AFlutter CTI ablation 2022 Discussed rational for having a monitoring strategy, he is not interested  NICM No exam findings of volume OL Baseline DOE with heavier activities only C/w Dr. Lalla Brothers  VHD Mod AI/AS ?bicuspic AV Murmur on exam C/w Dr. Lalla Brothers   4.   HTN Advised that he take his medicines every day Discussed he could perhaps try to use the just the spironolactone PRN for edema given his incontinence He sees Dr. Anne Fu in a couple weeks      Dispo: back with EP PRN, c/w Dr. Lalla Brothers as scheduled  Signed, Sheilah Pigeon, PA-C

## 2024-02-19 ENCOUNTER — Encounter: Payer: Self-pay | Admitting: Physician Assistant

## 2024-02-19 ENCOUNTER — Ambulatory Visit: Attending: Physician Assistant | Admitting: Physician Assistant

## 2024-02-19 VITALS — BP 158/90 | HR 74 | Ht 71.0 in | Wt 235.0 lb

## 2024-02-19 DIAGNOSIS — Q2381 Bicuspid aortic valve: Secondary | ICD-10-CM | POA: Diagnosis present

## 2024-02-19 DIAGNOSIS — I4892 Unspecified atrial flutter: Secondary | ICD-10-CM | POA: Diagnosis present

## 2024-02-19 DIAGNOSIS — I10 Essential (primary) hypertension: Secondary | ICD-10-CM | POA: Diagnosis not present

## 2024-02-19 DIAGNOSIS — I428 Other cardiomyopathies: Secondary | ICD-10-CM | POA: Insufficient documentation

## 2024-02-19 NOTE — Patient Instructions (Signed)
 Medication Instructions:   Your physician recommends that you continue on your current medications as directed. Please refer to the Current Medication list given to you today.   *If you need a refill on your cardiac medications before your next appointment, please call your pharmacy*   Lab Work: NONE ORDERED  TODAY    If you have labs (blood work) drawn today and your tests are completely normal, you will receive your results only by: MyChart Message (if you have MyChart) OR A paper copy in the mail If you have any lab test that is abnormal or we need to change your treatment, we will call you to review the results.  Testing/Procedures: NONE ORDERED  TODAY    Follow-Up: At [facility], you and your health needs are our priority.  As part of our continuing mission to provide you with exceptional heart care, our providers are all part of one team.  This team includes your primary Cardiologist (physician) and Advanced Practice Providers or APPs (Physician Assistants and Nurse Practitioners) who all work together to provide you with the care you need, when you need it.  Your next appointment:   AS SCHEDULED WITH DR Anne Fu   Provider:  WITH EP DEPARTMENT  CONTACT [facility] [phone] AS NEEDED FOR  ANY CARDIAC RELATED SYMPTOMS    We recommend signing up for the patient portal called "MyChart".  Sign up information is provided on this After Visit Summary.  MyChart is used to connect with patients for Virtual Visits (Telemedicine).  Patients are able to view lab/test results, encounter notes, upcoming appointments, etc.  Non-urgent messages can be sent to your provider as well.   To learn more about what you can do with MyChart, go to [email or web address].   Other Instructions        1st Floor: - Lobby - Registration  - Pharmacy  - Lab - Cafe  2nd Floor: - PV Lab - Diagnostic Testing (echo, CT, nuclear med)  3rd Floor: - Vacant  4th Floor: - TCTS  (cardiothoracic surgery) - AFib Clinic - Structural Heart Clinic - Vascular Surgery  - Vascular Ultrasound  5th Floor: - HeartCare Cardiology (general and EP) - Clinical Pharmacy for coumadin, hypertension, lipid, weight-loss medications, and med management appointments    Valet parking services will be available as well.

## 2024-03-10 ENCOUNTER — Ambulatory Visit: Payer: BLUE CROSS/BLUE SHIELD | Attending: Cardiology | Admitting: Cardiology

## 2024-03-10 ENCOUNTER — Encounter: Payer: Self-pay | Admitting: Cardiology

## 2024-03-10 VITALS — BP 190/100 | HR 83 | Ht 71.0 in | Wt 239.0 lb

## 2024-03-10 DIAGNOSIS — I428 Other cardiomyopathies: Secondary | ICD-10-CM | POA: Diagnosis present

## 2024-03-10 DIAGNOSIS — I10 Essential (primary) hypertension: Secondary | ICD-10-CM | POA: Diagnosis present

## 2024-03-10 MED ORDER — AMLODIPINE BESYLATE 5 MG PO TABS: 5.0000 mg | ORAL_TABLET | Freq: Every day | ORAL | 3 refills | Status: DC

## 2024-03-10 MED ORDER — VALSARTAN 320 MG PO TABS: 320.0000 mg | ORAL_TABLET | Freq: Every day | ORAL | 3 refills | Status: DC

## 2024-03-10 NOTE — Progress Notes (Signed)
 Cardiology Office Note:  .   Date:  03/10/2024  ID:  Matthew Schmitt, DOB Apr 27, 1951, MRN [id number] PCP: Practice, [facility]  [facility] Providers Cardiologist:  Dorothye Gathers, MD Electrophysiologist:  Boyce Byes, MD    History of Present Illness: .   Matthew Schmitt is a 73 y.o. male Discussed the use of AI scribe software for clinical note transcription with the patient, who gave verbal consent to proceed.  History of Present Illness Matthew Schmitt is a 73 year old male with atrial fibrillation and aortic stenosis who presents for follow-up.  He experiences mild dyspnea during physical activity. His blood pressure remains elevated, ranging from 140 to 160 mmHg, despite medication. He attributes a recent increase to missing his medication for four days while on a fishing trip.  His cardiac evaluations include a stress test showing an ejection fraction of 37% and a cardiac catheterization revealing moderate LAD stenosis. The last echocardiogram on January 08, 2023, indicated moderate aortic stenosis with an ejection fraction of 45-50%. He has a history of a 3/6 systolic murmur.  He is currently taking Toprol  50 mg, spironolactone  25 mg, and valsartan  160 mg. He is concerned about using diuretics due to urinary incontinence following prostate cancer surgery and prostate removal, preferring to avoid them due to embarrassment.  He notes that his skin has become thinner, leading to easier bruising and bleeding, particularly on his lower legs. He wears wraps for protection and finds it challenging to put on support socks. He attributes these changes to aging and possibly sun exposure.  He retired last year but has since returned to work. He is now on Medicaid and a supplement plan, transitioning to using [facility] for prescriptions, though he has not yet purchased anything through them.       Studies Reviewed: .        Results DIAGNOSTIC Echocardiogram:  Moderate aortic stenosis, EF 45-50% (03/11/2023) Cardiac catheterization: Moderate LAD stenosis Stress test: EF 37% Risk Assessment/Calculations:           Physical Exam:   VS:  BP (!) 190/100   Pulse 83   Ht 5\' 11"  (1.803 m)   Wt 239 lb (108.4 kg)   SpO2 97%   BMI 33.33 kg/m    Wt Readings from Last 3 Encounters:  03/10/24 239 lb (108.4 kg)  02/19/24 235 lb (106.6 kg)  12/03/23 230 lb 12.8 oz (104.7 kg)    GEN: Well nourished, well developed in no acute distress NECK: No JVD; No carotid bruits CARDIAC: RRR, 2/6 SM, no rubs, no gallops RESPIRATORY:  Clear to auscultation without rales, wheezing or rhonchi  ABDOMEN: Soft, non-tender, non-distended EXTREMITIES:  No edema; No deformity   ASSESSMENT AND PLAN: .    Assessment and Plan Assessment & Plan Aortic stenosis Moderate aortic stenosis, well-managed since last echocardiogram in April 2024. No significant changes in symptoms, with only mild dyspnea on exertion. Ejection fraction remains at 45-50%.  Atrial fibrillation Atrial fibrillation with previous successful cardioversion. Currently well-controlled with no recent episodes.  Hypertension Hypertension with recent elevated readings, likely due to non-compliance with medication during a trip. Current regimen includes Toprol  50 mg, spironolactone  25 mg, and valsartan  160 mg. Plan to optimize blood pressure control by adjusting medication regimen. Avoidance of diuretics is preferred due to urinary incontinence. - Increase valsartan  to 320 mg. - Add amlodipine  5 mg. - Refer to hypertension clinic for further management and monitoring in two months.  Prostate cancer  History of prostate cancer with prostatectomy. Continues to experience urinary incontinence, affecting tolerance to diuretics.          Signed, Dorothye Gathers, MD

## 2024-03-10 NOTE — Patient Instructions (Signed)
 Medication Instructions:  Please start Amlodipine  5 mg a day. Increase Diovan  320 mg daily. Continue all other medications  *If you need a refill on your cardiac medications before your next appointment, please call your pharmacy*  Follow-Up: At [facility], you and your health needs are our priority.  As part of our continuing mission to provide you with exceptional heart care, our providers are all part of one team.  This team includes your primary Cardiologist (physician) and Advanced Practice Providers or APPs (Physician Assistants and Nurse Practitioners) who all work together to provide you with the care you need, when you need it.  Your next appointment:   2 month(s)  Provider:   Hypertension Clinic      We recommend signing up for the patient portal called "MyChart".  Sign up information is provided on this After Visit Summary.  MyChart is used to connect with patients for Virtual Visits (Telemedicine).  Patients are able to view lab/test results, encounter notes, upcoming appointments, etc.  Non-urgent messages can be sent to your provider as well.   To learn more about what you can do with MyChart, go to [email or web address].      1st Floor: - Lobby - Registration  - Pharmacy  - Lab - Cafe  2nd Floor: - PV Lab - Diagnostic Testing (echo, CT, nuclear med)  3rd Floor: - Vacant  4th Floor: - TCTS (cardiothoracic surgery) - AFib Clinic - Structural Heart Clinic - Vascular Surgery  - Vascular Ultrasound  5th Floor: - HeartCare Cardiology (general and EP) - Clinical Pharmacy for coumadin, hypertension, lipid, weight-loss medications, and med management appointments    Valet parking services will be available as well.

## 2024-03-13 ENCOUNTER — Ambulatory Visit: Payer: BLUE CROSS/BLUE SHIELD | Admitting: Internal Medicine

## 2024-03-16 ENCOUNTER — Other Ambulatory Visit: Payer: Self-pay

## 2024-03-16 MED ORDER — ONETOUCH ULTRA VI STRP: ORAL_STRIP | 11 refills | Status: DC

## 2024-04-01 ENCOUNTER — Telehealth: Payer: Self-pay | Admitting: Cardiology

## 2024-04-01 MED ORDER — FUROSEMIDE 40 MG PO TABS: 40.0000 mg | ORAL_TABLET | Freq: Every day | ORAL | 3 refills | Status: DC

## 2024-04-01 NOTE — Telephone Encounter (Signed)
 Pt c/o swelling/edema: STAT if pt has developed SOB within 24 hours  If swelling, where is the swelling located?  Upper legs and torso  How much weight have you gained and in what time span?   Yes  Have you gained 2 pounds in a day or 5 pounds in a week?   Yes  Do you have a log of your daily weights (if so, list)?   No  Are you currently taking a fluid pill?   No  Are you currently SOB?   Yes  Have you traveled recently in a car or plane for an extended period of time?   3-4 hours in a car to [facility] recently  Patient stated he has been having fluid build up and SOB which has been coming on for the last two weeks and which has gotten worse.  Patient stated he will be in an appointment until after 9:00 am

## 2024-04-01 NOTE — Telephone Encounter (Signed)
 Spoke with pt regarding Dr. Renna Cary' suggestions to start Lasix  40 mg once daily. Pt agreeable. Pt verbalized understanding. All questions if any were answered.

## 2024-04-01 NOTE — Telephone Encounter (Signed)
 Spoke with pt regarding his swelling and sob. Pt stated he has been having swelling in the upper legs and torso and has been experiencing sob with exertion. Pt had no blood pressures to give but pt stated his heart rate has been in the 70s. Pt stated he has gained 10 lbs in the last 30 days. Taken to Dr. Renna Cary who suggested we start the pt on 40 mg once daily of Lasix . Prescription ordered and sent to pt's pharmacy of choice. Pt did not answer when called to go over the addition of Lasix . Left voice message to call back.

## 2024-04-01 NOTE — Telephone Encounter (Signed)
 Patient returning call.

## 2024-04-08 ENCOUNTER — Other Ambulatory Visit (HOSPITAL_COMMUNITY): Payer: Self-pay | Admitting: Urology

## 2024-04-08 DIAGNOSIS — C61 Malignant neoplasm of prostate: Secondary | ICD-10-CM

## 2024-04-15 ENCOUNTER — Telehealth: Payer: Self-pay | Admitting: Cardiology

## 2024-04-15 MED ORDER — SPIRONOLACTONE 25 MG PO TABS: 25.0000 mg | ORAL_TABLET | Freq: Every day | ORAL | 3 refills | Status: AC

## 2024-04-15 MED ORDER — METOPROLOL SUCCINATE ER 50 MG PO TB24: 50.0000 mg | ORAL_TABLET | Freq: Every day | ORAL | 3 refills | Status: DC

## 2024-04-15 MED ORDER — ROSUVASTATIN CALCIUM 20 MG PO TABS: 20.0000 mg | ORAL_TABLET | Freq: Every day | ORAL | 3 refills | Status: DC

## 2024-04-15 MED ORDER — FUROSEMIDE 40 MG PO TABS: 40.0000 mg | ORAL_TABLET | Freq: Every day | ORAL | 3 refills | Status: DC

## 2024-04-15 NOTE — Telephone Encounter (Signed)
*  STAT* If patient is at the pharmacy, call can be transferred to refill team.   1. Which medications need to be refilled? (please list name of each medication and dose if known) amLODipine  (NORVASC ) 5 MG tablet    furosemide  (LASIX ) 40 MG tablet   metoprolol  succinate (TOPROL -XL) 50 MG 24 hr tablet   rosuvastatin  (CRESTOR ) 20 MG tablet    spironolactone  (ALDACTONE ) 25 MG tablet     valsartan  (DIOVAN ) 320 MG tablet     2. Which pharmacy/location (including street and city if local pharmacy) is medication to be sent to? [facility] - [place], [place] - [address]   3. Do they need a 30 day or 90 day supply? 90

## 2024-04-15 NOTE — Telephone Encounter (Signed)
 Pt's medications were sent to pt's pharmacy as requested. Confirmation received.

## 2024-04-16 ENCOUNTER — Telehealth: Payer: Self-pay

## 2024-04-16 MED ORDER — LYUMJEV KWIKPEN 200 UNIT/ML ~~LOC~~ SOPN: 6.0000 [IU] | PEN_INJECTOR | Freq: Three times a day (TID) | SUBCUTANEOUS | 2 refills | Status: AC

## 2024-04-16 MED ORDER — TRESIBA FLEXTOUCH 200 UNIT/ML ~~LOC~~ SOPN: 90.0000 [IU] | PEN_INJECTOR | Freq: Every day | SUBCUTANEOUS | 2 refills | Status: AC

## 2024-04-16 MED ORDER — INSULIN PEN NEEDLE 32G X 4 MM MISC: 3 refills | Status: AC

## 2024-04-16 MED ORDER — ONETOUCH ULTRA VI STRP: ORAL_STRIP | 11 refills | Status: AC

## 2024-04-16 NOTE — Telephone Encounter (Signed)
 Patient called and left a VM Yesterday afternoon stating that he needed somethings fixed with this medications.   I was out of the office when the vm was left and so I returned his call this morning to get clarification on what was needed.  When speaking with the patient he states that his insurance changed to [employer] and his ID number is: [id number] and that he now has to use [facility].    Requested Prescriptions   Signed Prescriptions Disp Refills   glucose blood (ONETOUCH ULTRA) test strip 200 strip 11    Sig: Use to check blood sugar 1-2 times a day    Authorizing Provider: Emilie Harden    Ordering User: Annabell Key, Aubery Douthat S   Insulin  Lispro-aabc (LYUMJEV  KWIKPEN) 200 UNIT/ML KwikPen 18 mL 2    Sig: Inject 6-10 Units into the skin 3 (three) times daily before meals. Inject 6-10 units before meals, under skin    Authorizing Provider: Emilie Harden    Ordering User: Merril Isakson S   Insulin  Pen Needle 32G X 4 MM MISC 400 each 3    Sig: Use 4x a day    Authorizing Provider: Emilie Harden    Ordering User: Cleotis Sparr S   insulin  degludec (TRESIBA  FLEXTOUCH) 200 UNIT/ML FlexTouch Pen 45 mL 2    Sig: Inject 90 Units into the skin daily.    Authorizing Provider: Emilie Harden    Ordering User: Vernon Goodpasture

## 2024-04-17 ENCOUNTER — Telehealth: Payer: Self-pay

## 2024-04-17 NOTE — Telephone Encounter (Signed)
 I called pt to introduce myself as  the Coordinator of the [facility].   1. I confirmed with the patient he is aware of his referral to the clinic 6/6, arriving @ 12:30 pm.    2. I discussed the format of the clinic and the physicians he will be seeing that day.   3. I discussed where the clinic is located and how to contact me.   4. I confirmed his address and informed him I would be mailing a packet of information and forms to be completed. I asked him to bring them with him the day of his appointment.    He voiced understanding of the above. I asked him to call me if he has any questions or concerns regarding his appointments or the forms he needs to complete.

## 2024-04-21 ENCOUNTER — Encounter (HOSPITAL_COMMUNITY)
Admission: RE | Admit: 2024-04-21 | Discharge: 2024-04-21 | Disposition: A | Discharge status: Home / Self Care | Source: Ambulatory Visit | Attending: Urology | Admitting: Urology

## 2024-04-21 DIAGNOSIS — C61 Malignant neoplasm of prostate: Secondary | ICD-10-CM | POA: Insufficient documentation

## 2024-04-21 MED ORDER — FLOTUFOLASTAT F 18 GALLIUM 296-5846 MBQ/ML IV SOLN
8.1300 | Freq: Once | INTRAVENOUS | Status: AC
  Administered 2024-04-21: 8.13 via INTRAVENOUS
  Filled 2024-04-21: qty 9

## 2024-04-23 NOTE — Progress Notes (Unsigned)
                               Care Plan Summary  Name: Matthew Schmitt  DOB: [birth year]   Your Medical Team:   Urologist -  Dr. Florencio Hunting, [facility]  Radiation Oncologist - Dr. Kenith Payer, [facility]     Recommendations: 1) Hormonal Therapy (Androgen deprivation therapy) 2) Daily external beam radiation    * These recommendations are based on information available as of today's consult.      Recommendations may change depending on the results of further tests or exams.    Next Steps: 1) [facility] will contact you will an appointment to start ADT.   2) [facility] will contact you to set up a CT Simulation.   When appointments need to be scheduled, you will be contacted by [facility] and/or [facility].  Questions?  Please do not hesitate to call Katheleen Palmer, BSN, RN at [phone] with any questions or concerns.  Paola Bohr is your Oncology Nurse Navigator and is available to assist you while you're receiving your medical care at [facility].

## 2024-04-24 ENCOUNTER — Encounter: Payer: Self-pay | Admitting: Radiation Oncology

## 2024-04-24 ENCOUNTER — Ambulatory Visit
Admission: RE | Admit: 2024-04-24 | Discharge: 2024-04-24 | Disposition: A | Discharge status: Home / Self Care | Source: Ambulatory Visit | Attending: Radiation Oncology | Admitting: Radiation Oncology

## 2024-04-24 VITALS — BP 154/88 | HR 72 | Temp 97.3°F | Resp 18 | Ht 71.0 in | Wt 230.1 lb

## 2024-04-24 DIAGNOSIS — C61 Malignant neoplasm of prostate: Secondary | ICD-10-CM

## 2024-04-24 HISTORY — DX: Male erectile dysfunction, unspecified: N52.9

## 2024-04-24 HISTORY — DX: Elevated prostate specific antigen (PSA): R97.20

## 2024-04-24 NOTE — Progress Notes (Addendum)
 Radiation Oncology         [phone] ________________________________  Multidisciplinary Prostate Cancer Clinic  Initial Radiation Oncology Consultation  Name: Matthew Schmitt MRN: [id number]  Date: 04/24/2024  DOB: [birth year]  JX:[id number], [facility], MD   REFERRING PHYSICIAN: Florencio Hunting, MD  DIAGNOSIS: 73 y.o. gentleman with a solitary osseous metastasis in the right iliac bone and a rising PSA of 0.68 s/p RALP 04/2023 for Stage pT3aN0, Gleason 4+5 prostate cancer    ICD-10-CM   1. Malignant neoplasm of prostate (HCC)  C61       HISTORY OF PRESENT ILLNESS::Matthew Schmitt is a 72 y.o. gentleman with a diagnosis of prostate cancer. We previously met the patient in our multidisciplinary prostate cancer clinic in 02/2023. In summary, he was noted to have a rising PSA of 7.41 and an abnormal digital rectal exam in 11/2022. A prostate MRI on 01/19/23 showed three PI-RADS 4 lesions, prompting him to proceed with prostate biopsy on 01/31/23. He was found to have Gleason 5+4 prostate cancer, with other cores ranging from 4+5 down to 3+3.     He underwent staging PSMA PET scan on 03/11/23 showing no evidence of disease outside of the prostate. The patient's case was discussed in our multidisciplinary prostate cancer conference, and he was then seen in our prostate cancer clinic on 03/12/23. After our discussions, he opted to proceed with RALP which was performed on 04/22/23 under the care of of Dr. Rozanne Corners.  Final surgical pathology showed Gleason 4+5 prostatic adenocarcinoma with extraprostatic extension at the left lateral and right posterolateral base. Seminal vesicles, all margins and both sampled lymph nodes were negative for tumor. His postoperative PSA was barely detectable at 0.033.  Most recently, his PSA increased up to 0.62 on 04/01/24. This was verified to be 0.68 on repeat labs on 04/08/24. This prompted a restaging PSMA PET scan on 04/21/24, which revealed no  evidence of local prostate cancer recurrence in the surgical fossa and no evidence of metastatic adenopathy or visceral metastasis but there was a single focus of radiotracer activity within the right iliac bone, highly suspicious for a solitary osseous metastasis.  The patient reviewed the biopsy results with his urologist and he has kindly been referred today to the multidisciplinary prostate cancer clinic for presentation of pathology and radiology studies in our conference for discussion of potential radiation treatment options and clinical evaluation.  PREVIOUS RADIATION THERAPY: No  PAST MEDICAL HISTORY:  has a past medical history of Anxiety, Arthritis, Arthrosis of right acromioclavicular joint (01/29/2020), Atrial flutter (HCC), BPH associated with nocturia, BPH with obstruction/lower urinary tract symptoms (01/04/2017), CAD (coronary artery disease) (04/02/2013), Cancer (HCC), Diabetes (HCC) (01/18/2018), Dilated aortic root (HCC), Encounter for long-term (current) use of other medications (07/27/2013), Essential hypertension, benign, First degree heart block, Heart failure with mildly reduced ejection fraction (HFmrEF, 41-49%) (HCC) (08/18/2021), Heart murmur, History of bilateral knee replacement (02/07/2017), History of bladder stone, History of kidney stones, History of melanoma excision, Hypertensive heart disease (04/02/2013), Impingement syndrome of right shoulder (01/29/2020), Impotence of organic origin, Left-sided low back pain with left-sided sciatica (01/23/2019), Long term current use of anticoagulant therapy, Mixed hyperlipidemia, Multiple renal cysts, Nephrolithiasis (05/17/2014), Nonischemic cardiomyopathy (HCC), Osteoarthritis, PAF (paroxysmal atrial fibrillation) (HCC), Peripheral vascular disease (HCC), RBBB, RBBB (right bundle branch block with left anterior fascicular block) (04/02/2013), Renal oncocytoma of left kidney (06/18/2013), Right ureteral stone, Rotator cuff syndrome of  left shoulder (04/17/2016), Status post total left knee replacement (01/01/2020), Tendinopathy of  right biceps tendon (01/29/2020), Tendinopathy of right rotator cuff (01/29/2020), Type 2 diabetes mellitus treated with insulin  (HCC), and Wears glasses.    PAST SURGICAL HISTORY: Past Surgical History:  Procedure Laterality Date   A-FLUTTER ABLATION N/A 10/27/2021   Procedure: A-FLUTTER ABLATION;  Surgeon: Boyce Byes, MD;  Location: [facility] INVASIVE CV LAB;  Service: Cardiovascular;  Laterality: N/A;   CARDIAC CATHETERIZATION  08-17-2011  DR Jolena Nay [place]   POSSIBLE MODERATE LAD STENOSIS JUST AFTER THE BIFURCATION OF LARGE DIAGONAL BRANCH/ LVEF  40-45%/ MILD GLOBAL HYPODINESIS (CATH DONE FOR ABNORMAL STRESS TEST OF FIXED LATERAL WALL DEFECT & BIFASCICULAR BLAOCK)   CARDIOVERSION N/A 04/02/2013   Procedure: CARDIOVERSION;  Surgeon: Dorsey Gault, MD;  Location: [facility] OR;  Service: Cardiovascular;  Laterality: N/A;   CARDIOVERSION N/A 12/27/2020   Procedure: CARDIOVERSION;  Surgeon: Maudine Sos, MD;  Location: [facility] ENDOSCOPY;  Service: Cardiovascular;  Laterality: N/A;   CYSTOSCOPY WITH LITHOLAPAXY N/A 05/18/2015   Procedure: CYSTOSCOPY WITH LITHOLAPAXY;  Surgeon: Ann Barnacle, MD;  Location: [facility];  Service: Urology;  Laterality: N/A;   CYSTOSCOPY WITH LITHOLAPAXY N/A 01/04/2017   Procedure: CYSTOSCOPY WITH LITHOLAPAXY, Holmium Laser;  Surgeon: Andrez Banker, MD;  Location: [facility] ORS;  Service: Urology;  Laterality: N/A;   CYSTOSCOPY WITH RETROGRADE PYELOGRAM, URETEROSCOPY AND STENT PLACEMENT  11/03/2012   Procedure: CYSTOSCOPY WITH RETROGRADE PYELOGRAM, URETEROSCOPY AND STENT PLACEMENT;  Surgeon: Livingston Rigg, MD;  Location: [facility]  Service: Urology;  Laterality: Left;  Cystoscopy/Left Retrograde/Ureteroscopy/Holmium Laser Litho/Double J Stent   CYSTOSCOPY WITH URETEROSCOPY, STONE BASKETRY AND STENT PLACEMENT Right 05/15/2019   Procedure:  CYSTOSCOPY WITH URETEROSCOPY, LASER LITHOTRIPSY STONE BASKETRY AND STENT PLACEMENT, RIGHT RETROGRADE;  Surgeon: Andrez Banker, MD;  Location: [facility];  Service: Urology;  Laterality: Right;   HOLMIUM LASER APPLICATION  11/03/2012   Procedure: HOLMIUM LASER APPLICATION;  Surgeon: Livingston Rigg, MD;  Location: [facility];  Service: Urology;  Laterality: Left;   HOLMIUM LASER APPLICATION N/A 05/18/2015   Procedure: HOLMIUM LASER APPLICATION;  Surgeon: Ann Barnacle, MD;  Location: [facility];  Service: Urology;  Laterality: N/A;   KNEE SURGERY Bilateral 1978  &  1974   LYMPHADENECTOMY Bilateral 04/22/2023   Procedure: BILATERAL PELVIC LYMPHADENECTOMY;  Surgeon: Florencio Hunting, MD;  Location: [facility] ORS;  Service: Urology;  Laterality: Bilateral;   MELANOMA EXCISION Bilateral 04/2015   20th-- left leg //  1st-- right leg w/ skin graft from right thigh (no lymph node bx)   NEPHROLITHOTOMY Right 05/17/2014   Procedure: NEPHROLITHOTOMY PERCUTANEOUS;  Surgeon: Livingston Rigg, MD;  Location: [facility];  Service: Urology;  Laterality: Right;   PILONIDAL CYST EXCISION     PROSTATE BIOPSY     RIGHT/LEFT HEART CATH AND CORONARY ANGIOGRAPHY N/A 09/27/2023   Procedure: RIGHT/LEFT HEART CATH AND CORONARY ANGIOGRAPHY;  Surgeon: Swaziland, Peter M, MD;  Location: [facility] INVASIVE CV LAB;  Service: Cardiovascular;  Laterality: N/A;   ROBOT ASSISTED LAPAROSCOPIC RADICAL PROSTATECTOMY N/A 04/22/2023   Procedure: XI ROBOTIC ASSISTED LAPAROSCOPIC RADICAL PROSTATECTOMY LEVEL 2;  Surgeon: Florencio Hunting, MD;  Location: [facility]S;  Service: Urology;  Laterality: N/A;   ROBOTIC ASSITED PARTIAL NEPHRECTOMY Left 06/18/2013   Procedure: ROBOTIC ASSISTED PARTIAL NEPHRECTOMY;  Surgeon: Kristeen Peto, MD;  Location: [facility];  Service: Urology;  Laterality: Left;   ROTATOR CUFF REPAIR Left 07/2016   SHOULDER ARTHROSCOPY WITH ROTATOR CUFF REPAIR AND SUBACROMIAL DECOMPRESSION Right 05/27/2020    Procedure: RIGHT  SHOULDER ARTHROSCOPY WITH EXTENSIVE DEBRIDEMENT, DISTAL CLAVICLE EXCISION AND SUBACROMIAL DECOMPRESSION;  Surgeon: Wes Hamman, MD;  Location: [facility];  Service: Orthopedics;  Laterality: Right;   TEE WITH CARDIOVERSION  12/27/2020   TEE WITHOUT CARDIOVERSION N/A 12/27/2020   Procedure: TRANSESOPHAGEAL ECHOCARDIOGRAM (TEE);  Surgeon: Maudine Sos, MD;  Location: [facility] ENDOSCOPY;  Service: Cardiovascular;  Laterality: N/A;   TONSILLECTOMY  25  (age 77)   TOTAL KNEE ARTHROPLASTY Left 02/07/2017   Procedure: LEFT TOTAL KNEE ARTHROPLASTY;  Surgeon: Wes Hamman, MD;  Location: [facility] OR;  Service: Orthopedics;  Laterality: Left;   TOTAL KNEE ARTHROPLASTY Right 04/25/2017   Procedure: RIGHT TOTAL KNEE ARTHROPLASTY;  Surgeon: Wes Hamman, MD;  Location: [facility] OR;  Service: Orthopedics;  Laterality: Right;   TOTAL KNEE REVISION Left 07/13/2022   Procedure: LEFT TOTAL KNEE REVISION, TIBIAL COMPONENT;  Surgeon: Wes Hamman, MD;  Location: [facility] OR;  Service: Orthopedics;  Laterality: Left;   TRANSURETHRAL RESECTION OF PROSTATE N/A 01/04/2017   Procedure: TRANSURETHRAL RESECTION OF THE PROSTATE (TURP);  Surgeon: Andrez Banker, MD;  Location: [facility] ORS;  Service: Urology;  Laterality: N/A;    FAMILY HISTORY: family history includes Arthritis in his father and mother; Cancer in his maternal uncle; Diabetes in his brother and father; Heart attack (age of onset: 15) in his brother; Hypertension in his father and mother; Prostate cancer (age of onset: 25 - 89) in his paternal uncle.  SOCIAL HISTORY:  reports that he quit smoking about 54 years ago. His smoking use included cigarettes. He started smoking about 60 years ago. He has a 6 pack-year smoking history. He has never used smokeless tobacco. He reports current alcohol use. He reports that he does not use drugs.  ALLERGIES: Actos [pioglitazone]  MEDICATIONS:  Current Outpatient Medications  Medication Sig Dispense  Refill   amLODipine  (NORVASC ) 5 MG tablet Take 1 tablet (5 mg total) by mouth daily. 90 tablet 3   aspirin  EC 81 MG tablet Take 1 tablet (81 mg total) by mouth daily. Swallow whole.     dapagliflozin  propanediol (FARXIGA ) 10 MG TABS tablet Take 1 tablet (10 mg total) by mouth daily before breakfast. 90 tablet 3   furosemide  (LASIX ) 40 MG tablet Take 1 tablet (40 mg total) by mouth daily. 90 tablet 3   glucose blood (ONETOUCH ULTRA) test strip Use to check blood sugar 1-2 times a day 200 strip 11   insulin  degludec (TRESIBA  FLEXTOUCH) 200 UNIT/ML FlexTouch Pen Inject 90 Units into the skin daily. 45 mL 2   Insulin  Lispro-aabc (LYUMJEV  KWIKPEN) 200 UNIT/ML KwikPen Inject 6-10 Units into the skin 3 (three) times daily before meals. Inject 6-10 units before meals, under skin 18 mL 2   Insulin  Pen Needle 32G X 4 MM MISC Use 4x a day 400 each 3   Lancets (ONETOUCH DELICA PLUS LANCET33G) MISC 1 each by Other route 2 (two) times daily. E11.9 200 each 3   metoprolol  succinate (TOPROL -XL) 50 MG 24 hr tablet Take 1 tablet (50 mg total) by mouth daily. Take with or immediately following a meal. 90 tablet 3   nitroGLYCERIN  (NITROSTAT ) 0.4 MG SL tablet Place 0.4 mg under the tongue every 5 (five) minutes x 3 doses as needed for chest pain.     rosuvastatin  (CRESTOR ) 20 MG tablet Take 1 tablet (20 mg total) by mouth daily. 90 tablet 3   spironolactone  (ALDACTONE ) 25 MG tablet Take 1 tablet (25 mg total) by mouth daily. 90 tablet  3   trolamine salicylate (ASPERCREME) 10 % cream Apply 1 application  topically 3 (three) times daily as needed for muscle pain.     valsartan  (DIOVAN ) 320 MG tablet Take 1 tablet (320 mg total) by mouth daily. 90 tablet 3   No current facility-administered medications for this encounter.    REVIEW OF SYSTEMS:  On review of systems, the patient reports that he is doing well overall. He denies any chest pain, shortness of breath, cough, fevers, chills, night sweats, unintended weight  changes. He denies any bowel disturbances, and denies abdominal pain, nausea or vomiting. He denies any new musculoskeletal or joint aches or pains.  He has had persistent significant urinary incontinence since the time of his surgery but admits that he has not been compliant with his pelvic floor strengthening exercises.  He is still utilizing between 12-15 pads per day.  His SHIM was 0, indicating he has severe erectile dysfunction. A complete review of systems is obtained and is otherwise negative.   PHYSICAL EXAM:  Wt Readings from Last 3 Encounters:  03/10/24 239 lb (108.4 kg)  02/19/24 235 lb (106.6 kg)  12/03/23 230 lb 12.8 oz (104.7 kg)   Temp Readings from Last 3 Encounters:  09/27/23 97.7 F (36.5 C) (Temporal)  04/28/23 98.6 F (37 C) (Oral)  04/23/23 98 F (36.7 C) (Oral)   BP Readings from Last 3 Encounters:  03/10/24 (!) 190/100  02/19/24 (!) 158/90  12/03/23 (!) 140/70   Pulse Readings from Last 3 Encounters:  03/10/24 83  02/19/24 74  12/03/23 77    /10  In general this is a well appearing Caucasian man in no acute distress. He's alert and oriented x4 and appropriate throughout the examination. Cardiopulmonary assessment is negative for acute distress and he exhibits normal effort.    KPS = 100  100 - Normal; no complaints; no evidence of disease. 90   - Able to carry on normal activity; minor signs or symptoms of disease. 80   - Normal activity with effort; some signs or symptoms of disease. 5   - Cares for self; unable to carry on normal activity or to do active work. 60   - Requires occasional assistance, but is able to care for most of his personal needs. 50   - Requires considerable assistance and frequent medical care. 40   - Disabled; requires special care and assistance. 30   - Severely disabled; hospital admission is indicated although death not imminent. 20   - Very sick; hospital admission necessary; active supportive treatment necessary. 10   -  Moribund; fatal processes progressing rapidly. 0     - Dead  Karnofsky DA, Abelmann WH, Craver LS and Burchenal JH [phone]) The use of the nitrogen mustards in the palliative treatment of carcinoma: with particular reference to bronchogenic carcinoma Cancer 1 634-56   LABORATORY DATA:  Lab Results  Component Value Date   WBC 7.2 09/23/2023   HGB 13.6 09/27/2023   HCT 40.0 09/27/2023   MCV 86 09/23/2023   PLT 224 09/23/2023   Lab Results  Component Value Date   NA 145 (H) 12/12/2023   K 4.7 12/12/2023   CL 105 12/12/2023   CO2 25 12/12/2023   Lab Results  Component Value Date   ALT 16 09/07/2022   AST 16 09/07/2022   ALKPHOS 97 09/07/2022   BILITOT 0.4 09/07/2022     RADIOGRAPHY: NM PET (PSMA) SKULL TO MID THIGH Result Date: 04/22/2024 CLINICAL DATA:  Prostate  carcinoma with biochemical recurrence. PSA equal 0.62 EXAM: NUCLEAR MEDICINE PET SKULL BASE TO THIGH TECHNIQUE: 8.1 mCi Flotufolastat (Posluma ) was injected intravenously. Full-ring PET imaging was performed from the skull base to thigh after the radiotracer. CT data was obtained and used for attenuation correction and anatomic localization. COMPARISON:  PSMA PET scan 03/11/2023 FINDINGS: NECK No radiotracer activity in neck lymph nodes. Incidental CT finding: None. CHEST No radiotracer accumulation within mediastinal or hilar lymph nodes. No suspicious pulmonary nodules on the CT scan. Incidental CT finding: Bilateral moderate size layering pleural effusions. ABDOMEN/PELVIS Prostate: No abnormal radiotracer activity in prostatectomy bed. Lymph nodes: No abnormal radiotracer accumulation within pelvic or abdominal nodes. Liver: No evidence of liver metastasis. Incidental CT finding: Bilateral nonobstructing renal calculi. Gallstones noted. Bilateral scrotal hydroceles. SKELETON Single focus intense radiotracer activity within the posterior RIGHT iliac bone with SUV max equal 8.6 on image 172. No CT correlation. Lesion is estimated at  5-10 mm in size. No radiotracer activity at this site on comparison PSMA PET scan from 03/13/2023. No lesion identified on comparison prostate MRI. IMPRESSION: 1. No evidence of local prostate cancer recurrence in the prostatectomy bed. 2. No evidence of metastatic adenopathy or visceral metastasis. 3. Single focus of radiotracer activity within the RIGHT iliac bone. Findings concerning for oligometastatic skeletal metastasis. 4. Bilateral moderate size pleural effusions new from prior. Electronically Signed   By: Deboraha Fallow M.D.   On: 04/22/2024 09:16      IMPRESSION/PLAN: 73 y.o. gentleman with a solitary osseous metastasis in right iliac bone and a rising PSA of 0.68 s/p RALP 04/2023 for Stage pT3aN0, Gleason 4+5 prostate cancer  Today, we talked to the patient about the findings and workup thus far. We discussed the natural history of prostate cancer and general treatment, highlighting the role of salvage radiotherapy in the management of oligo- metastatic disease. We discussed the available radiation techniques, and focused on the details and logistics of delivery.  The recommendation is for ADT concurrent with a 7.5 week course of daily external beam radiotherapy to the prostatic fossa and pelvic lymph nodes with a boost to the fossa and solitary pelvic metastasis.  We reviewed the anticipated acute and late sequelae associated with radiation in this setting. The patient was encouraged to ask questions that were answered to his stated satisfaction.  At the end of the conversation the patient is interested in moving forward with the recommended 7.5 week course of daily external beam radiotherapy to the prostatic fossa and pelvic lymph nodes with a boost to the fossa and solitary osseous metastasis in the right iliac bone, concurrent with ADT.  He has freely signed written consent to proceed today in the office and a copy of this document will be placed in his medical record.  We will share our  discussion with Dr. Rozanne Corners and coordinate for the start of ADT, first available, prior to starting the radiation treatments.  He is tentatively scheduled for CT simulation/treatment planning at 8 AM on Friday, 05/01/2024, in anticipation of beginning his daily treatments in the near future.  We enjoyed meeting with him again today and look forward to continuing to participate in his care.  We personally spent 70 minutes in this encounter including chart review, reviewing radiological studies, meeting face-to-face with the patient, entering orders and completing documentation.    Arta Bihari, PA-C    Kenith Payer, MD  [facility]  Radiation Oncology Direct Dial: [phone]  Fax: [phone] [email or web address]  Skype  [email or web address]  This document serves as a record of services personally performed by Kenith Payer, MD and Keitha Pata, PA-C. It was created on their behalf by Florance Hun, a trained medical scribe. The creation of this record is based on the scribe's personal observations and the provider's statements to them. This document has been checked and approved by the attending provider.

## 2024-04-24 NOTE — Consult Note (Signed)
 [facility]     04/24/2024   --------------------------------------------------------------------------------   Matthew Schmitt  MRN: [id number]  DOB: November 17, 1951, 73 year old old Male  SSN: [id number]   PRIMARY CARE:  Maura L. Hamrick, MD  PRIMARY CARE FAX:  [phone]  REFERRING:  Dwan Girt  PROVIDER:  Florencio Hunting, M.D.  LOCATION:  [facility] [phone]     --------------------------------------------------------------------------------   CC/HPI: CC: Prostate Cancer   PCP: Dr. Enos Harts  Location of consult: [facility] - [facility]   Matthew Schmitt is a 73 year old gentleman gentleman with a past medical history significant for atrial fibrillation/atrial flutter, diabetes, hyperlipidemia, and PVD. He was seen in the prostate MDC in April 2024 after he had been diagnosed with clinical stage T2a N0 M0, Gleason 5+4=9 adenocarcinoma in 16 out of 20 biopsy cores including 3 separate MR lesions. Staging PSMA PET scan on 03/10/23 was negative for metastatic disease. He proceed with primary surgical therapy in June 2024 with his pathology indicating a pT3a N0 M0, Gleason 4+5=9 adenocarcinoma with negative surgical margins. His first postoperative PSA in September 2024 was undetectable but increased to 0.62 in May of 2025 and confirmed at 0.68 on repeat testing. He underwent a PSMA PET scan on 04/21/24 that demonstrated a solitary metastatic site to the right ilium. There is no uptake in the prostatic fossa.   Unfortunately, he has continued to have severe stress incontinence and likely will require artificial sphincter placement at some point.     ALLERGIES: No Allergies    MEDICATIONS: amLODIPine  Besylate 5 MG Tablet  Aspirin  81  Farxiga  10 MG Tablet  Lyumjev  KwikPen 200 UNIT/ML Solution Pen-injector  Metoprolol  Succinate ER 50 MG Tablet Extended Release 24 Hour  Nitro-Dur  0.4 MG/HR Patch 24 Hour  Rosuvastatin   Calcium  20 MG Tablet  Spironolactone  25 MG Tablet  Tresiba   Trolamine  Valsartan  320 MG Tablet     GU PSH: Cysto Bladder Stone <2.5cm - 2016 Cysto Bladder Stone >2.5cm - 2018 Cysto Uretero Lithotripsy - 2013 Cystoscopy - 2021, 2017 Cystoscopy Insert Stent - 2013 Cystoscopy TURP - 2018 Locm 300-399Mg /Ml Iodine , - 2020 Partial nephrectomy (laparoscopic) - 2014 Percut Stone Removal >2cm - 2015 Prostate Needle Biopsy - 01/31/2023 Ureteroscopic laser litho, Right - 2020       PSH Notes: Knee Surgery   NON-GU PSH: Back Surgery (Unspecified) - 2009 Remove Tonsils - 2009 Shoulder Arthroscopy/surgery, Left - 2017 Surgical Pathology, Gross And Microscopic Examination For Prostate Needle - 01/31/2023 Visit Complexity (formerly GPC1X) - 04/08/2024, 07/31/2023     GU PMH: Prostate Cancer - 04/08/2024, - 07/31/2023, - 04/30/2023, - 04/09/2023, - 03/12/2023, - 02/07/2023 Rising PSA after prostate cancer treatment - 04/08/2024 Stress Incontinence - 04/08/2024, - 12/16/2023, - 12/12/2023, - 10/28/2023, - 10/23/2023, - 09/13/2023, - 07/31/2023, - 07/31/2023, - 07/24/2023, - 07/03/2023, - 06/05/2023, - 05/29/2023 (Stable), - 04/30/2023, - 03/28/2023 ED due to arterial insufficiency (Stable) - 04/30/2023, Erectile dysfunction due to arterial insufficiency, - 2014 Elevated PSA - 01/31/2023, - 12/14/2022, - 07/10/2022 Encounter for Prostate Cancer screening - 12/14/2022, - 2019 Weak Urinary Stream - 12/14/2022, - 2017 Renal calculus - 2020, - 2019, Nonobstructive, and asymptomatic currently., - 2018, - 2017, Nephrolithiasis, - 2016 Urinary Urgency - 2017 Flank Pain (Acute), Right, Ketorolac  30 mg IM PRN NSAID - 2017 Acute Cystitis/UTI - 2017 Bladder Stone, Bladder calculus - 2016 Urinary Tract Inf, Unspec site, Pyuria - 2016 BPH w/LUTS, Benign prostatic hyperplasia with urinary obstruction - 2016  Urinary Frequency, Increased urinary frequency - 2016 Abdominal Pain Unspec, Left flank pain - 2016 Benign tumor right  kidney, Renal oncocytoma, right - 2015 Benign Neo Kidney, Unspec, Renal oncocytoma, unspecified laterality - 2015 Gross hematuria, Gross hematuria - 2015 Ureteral calculus, Calculus of ureter - 2014      PMH Notes:   1) Renal oncocytoma: He is s/p a left RAL partial nephrectomy in 2014 for what turned out to be an oncocytoma.   2) Urolithiasis: He is s/p R PCNL in the past.   3) BPH/LUTS: He is s/p TURP and cystolithalopaxy in 2018.   4) Prostate cancer: He is s/p a NNS RAL radical prostatectomy and BPLND on 04/22/23.   Diagnosis: pT3a N0 M0, Gleason 4+5=9 adenocarcinoma with negative surgical margins  Pretreatment PSA: 7.41  Pretreatment SHIM score: He has not been sexually active for many years prior to his prostatectomy.   NON-GU PMH: Muscle weakness (generalized) - 12/16/2023, - 12/12/2023, - 10/28/2023, - 10/23/2023, - 09/13/2023, - 07/31/2023, - 07/24/2023 Other muscle spasm - 12/16/2023, - 12/12/2023, - 10/28/2023, - 10/23/2023, - 09/13/2023, - 07/31/2023, - 07/24/2023 Unspecified atrial fibrillation, Atrial Fibrillation - 2014 Diabetes Type 2 Hypertension    FAMILY HISTORY: Diabetes - Runs In Family Hypertension - Runs In Family Kidney Cancer - No Family History   SOCIAL HISTORY: Marital Status: Married Preferred Language: English; Ethnicity: Not Hispanic Or Latino; Race: White Current Smoking Status: Patient does not smoke anymore. Has not smoked since 04/19/1996.  Does drink.  Drinks 1 caffeinated drink per day.    REVIEW OF SYSTEMS:    GU Review Male:   Patient denies frequent urination, hard to postpone urination, burning/ pain with urination, get up at night to urinate, leakage of urine, stream starts and stops, trouble starting your streams, and have to strain to urinate .  Gastrointestinal (Lower):   Patient denies diarrhea and constipation.  Gastrointestinal (Upper):   Patient denies nausea and vomiting.  Constitutional:   Patient denies fever, night sweats, weight loss, and  fatigue.  Skin:   Patient denies skin rash/ lesion and itching.  Eyes:   Patient denies blurred vision and double vision.  Ears/ Nose/ Throat:   Patient denies sore throat and sinus problems.  Hematologic/Lymphatic:   Patient denies swollen glands and easy bruising.  Cardiovascular:   Patient denies leg swelling and chest pains.  Respiratory:   Patient denies cough and shortness of breath.  Endocrine:   Patient denies excessive thirst.  Musculoskeletal:   Patient denies back pain and joint pain.  Neurological:   Patient denies headaches and dizziness.  Psychologic:   Patient denies depression and anxiety.   VITAL SIGNS: None   MULTI-SYSTEM PHYSICAL EXAMINATION:    Constitutional: Well-nourished. No physical deformities. Normally developed. Good grooming.     Complexity of Data:  Lab Test Review:   PSA  Records Review:   Previous Patient Records  X-Ray Review: PET- PSMA Scan: Reviewed Films.     04/08/24 04/01/24 07/24/23 12/14/22 07/10/22 05/17/20 04/15/18  PSA  Total PSA 0.68 ng/mL 0.62 ng/mL 0.033 ng/mL 7.41 ng/mL 4.88 ng/mL 2.66 ng/mL 1.42 ng/mL   Notes:                     CLINICAL DATA: Prostate carcinoma with biochemical recurrence. PSA  equal 0.62   EXAM:  NUCLEAR MEDICINE PET SKULL BASE TO THIGH   TECHNIQUE:  8.1 mCi Flotufolastat (Posluma) was injected intravenously.  Full-ring PET imaging was performed from the skull base  to thigh  after the radiotracer. CT data was obtained and used for attenuation  correction and anatomic localization.   COMPARISON: PSMA PET scan 03/11/2023   FINDINGS:  NECK   No radiotracer activity in neck lymph nodes.   Incidental CT finding: None.   CHEST   No radiotracer accumulation within mediastinal or hilar lymph nodes.  No suspicious pulmonary nodules on the CT scan.   Incidental CT finding: Bilateral moderate size layering pleural  effusions.   ABDOMEN/PELVIS   Prostate: No abnormal radiotracer activity in prostatectomy  bed.   Lymph nodes: No abnormal radiotracer accumulation within pelvic or  abdominal nodes.   Liver: No evidence of liver metastasis.   Incidental CT finding: Bilateral nonobstructing renal calculi.  Gallstones noted. Bilateral scrotal hydroceles.   SKELETON   Single focus intense radiotracer activity within the posterior RIGHT  iliac bone with SUV max equal 8.6 on image 172. No CT correlation.  Lesion is estimated at 5-10 mm in size. No radiotracer activity at  this site on comparison PSMA PET scan from 03/13/2023. No lesion  identified on comparison prostate MRI.   IMPRESSION:  1. No evidence of local prostate cancer recurrence in the  prostatectomy bed.  2. No evidence of metastatic adenopathy or visceral metastasis.  3. Single focus of radiotracer activity within the RIGHT iliac bone.  Findings concerning for oligometastatic skeletal metastasis.  4. Bilateral moderate size pleural effusions new from prior.    Electronically Signed  By: Deboraha Fallow M.D.  On: 04/22/2024 09:16   PROCEDURES:          Visit Complexity - G2211    ASSESSMENT:      ICD-10 Details  1 GU:   Prostate Cancer - C61   2   Stress Incontinence - N39.3    PLAN:           Schedule Labs: 3 Months - Total Testosterone    3 Months - Urinalysis    3 Months - PSA    6 Months - Urinalysis    6 Months - Total Testosterone    6 Months - PSA  Return Visit/Planned Activity: 3 Months - Office Visit  Return Visit/Planned Activity: 6 Months - Eligard, Office Visit          Document Letter(s):  Created for Patient: Clinical Summary         Notes:   1. Biochemically recurrent prostate cancer with solitary metastasis: I reviewed his PSMA PET scan with him indicating a solitary metastatic focus to the right iliac bone. Reviewing options, we have discussed proceeding with 2 years of androgen deprivation therapy along with radiation therapy to his metastatic site and pelvis. He is scheduled to meet with  Dr. Lorri Rota later this afternoon. In addition, we discussed potentially escalating his systemic therapy in a few months. Considering the fact that he is dealing with hypertension and fluid retention already, we may avoid abiraterone and instead consider androgen receptor blocker therapy for therapy escalation. He seems amenable to this. He will be scheduled to begin androgen deprivation therapy in the near future and we have reviewed the potential side effects that would be expected.   2. Stress incontinence: This remains severe. We will discuss a referral to Dr. Jarvis Mesa when he sees me next to consider the option of an artificial urinary sphincter after he completes radiation therapy.   CC: Dr. Baruch Bosch Hamrick  Dr. Kenith Payer    E & M CODES: We spent 36 minutes dedicated to evaluation  and management time, including face to face interaction, discussions on coordination of care, documentation, result review, and discussion with others as applicable.

## 2024-05-01 ENCOUNTER — Ambulatory Visit
Admission: RE | Admit: 2024-05-01 | Discharge: 2024-05-01 | Disposition: A | Discharge status: Home / Self Care | Source: Ambulatory Visit | Attending: Radiation Oncology | Admitting: Radiation Oncology

## 2024-05-01 DIAGNOSIS — C61 Malignant neoplasm of prostate: Secondary | ICD-10-CM | POA: Diagnosis present

## 2024-05-01 DIAGNOSIS — Z510 Encounter for antineoplastic radiation therapy: Secondary | ICD-10-CM | POA: Diagnosis present

## 2024-05-01 DIAGNOSIS — C7951 Secondary malignant neoplasm of bone: Secondary | ICD-10-CM | POA: Diagnosis not present

## 2024-05-01 NOTE — Progress Notes (Signed)
 Patient will receive ADT today, 6/13, at [facility].   RN left message for call back to assess any new navigation needs or questions regarding treatment.

## 2024-05-01 NOTE — Addendum Note (Signed)
 Encounter addended by: Keitha Pata, PA-C on: 05/01/2024 9:11 AM  Actions taken: Clinical Note Signed

## 2024-05-01 NOTE — Progress Notes (Signed)
  Radiation Oncology         [phone] ________________________________  Name: Matthew Schmitt MRN: [id number]  Date: 05/01/2024  DOB: 1951/01/10  SIMULATION AND TREATMENT PLANNING NOTE    ICD-10-CM   1. Malignant neoplasm of prostate (HCC)  C61       DIAGNOSIS:  73 y.o. gentleman with a solitary osseous metastasis in the right iliac bone and a rising PSA of 0.68 s/p RALP 04/2023 for Stage pT3aN0, Gleason 4+5 prostate cancer   NARRATIVE:  The patient was brought to the CT Simulation planning suite.  Identity was confirmed.  All relevant records and images related to the planned course of therapy were reviewed.  The patient freely provided informed written consent to proceed with treatment after reviewing the details related to the planned course of therapy. The consent form was witnessed and verified by the simulation staff.  Then, the patient was set-up in a stable reproducible supine position for radiation therapy.  A vacuum lock pillow device was custom fabricated to position his legs in a reproducible immobilized position.  Then, I performed a urethrogram under sterile conditions to identify the prostatic apex.  CT images were obtained.  Surface markings were placed.  The CT images were loaded into the planning software.  Then the prostate target and avoidance structures including the rectum, bladder, bowel and hips were contoured.  Treatment planning then occurred.  The radiation prescription was entered and confirmed.  A total of one complex treatment devices was fabricated. I have requested : Intensity Modulated Radiotherapy (IMRT) is medically necessary for this case for the following reason:  Rectal sparing.[name]  PLAN:   The prostate fossa and pelvic lymph nodes will initially be treated to 45 Gy in 25 fractions of 1.8 Gy followed by a boost to the fossa and solitary osseous metastasis in the right iliac bone, to 68.4 Gy and 32.5 Gy with 13 additional fractions of 1.8 Gy and 2.5 Gy  respectively.   ________________________________  Trilby Fujisawa Lorri Rota, M.D.

## 2024-05-07 DIAGNOSIS — Z510 Encounter for antineoplastic radiation therapy: Secondary | ICD-10-CM | POA: Diagnosis not present

## 2024-05-07 NOTE — Progress Notes (Signed)
 RN spoke with patient.  No needs at this time.  Patient received Eligard 45mg  on 6/13 and will start his salvage radiation treatment on 6/25.  Patient knows to reach out with any new questions or concerns that may arise.

## 2024-05-08 ENCOUNTER — Ambulatory Visit: Attending: Cardiology | Admitting: Pharmacist

## 2024-05-08 VITALS — BP 140/64 | HR 66

## 2024-05-08 DIAGNOSIS — I119 Hypertensive heart disease without heart failure: Secondary | ICD-10-CM

## 2024-05-08 MED ORDER — VALSARTAN 160 MG PO TABS: 160.0000 mg | ORAL_TABLET | Freq: Every day | ORAL | 3 refills | Status: DC

## 2024-05-08 MED ORDER — ROSUVASTATIN CALCIUM 20 MG PO TABS: 20.0000 mg | ORAL_TABLET | Freq: Every day | ORAL | 3 refills | Status: AC

## 2024-05-08 MED ORDER — VALSARTAN 320 MG PO TABS: 320.0000 mg | ORAL_TABLET | Freq: Every day | ORAL | 3 refills | Status: DC

## 2024-05-08 MED ORDER — AMLODIPINE BESYLATE 5 MG PO TABS: 5.0000 mg | ORAL_TABLET | Freq: Every day | ORAL | 3 refills | Status: DC

## 2024-05-08 NOTE — Assessment & Plan Note (Signed)
 Assessment: Blood pressure today in clinic above goal but much better than reported home readings Patient's home blood pressure cuff has not been validated.  Patient also checking blood pressure prior to morning medications and without resting He has been out of valsartan  for at least a week Takes furosemide  about 4-5 times per week No lab work since increasing valsartan  (although he has been off for over a week) or adding furosemide  Reports mild dizziness on occasion  Plan: Check BMP today Restart valsartan  but at 160 mg daily Continue amlodipine  5mg  daily, furosemide  40mg  daily, spironolactone  25mg  daily, metoprolol  succinate 50g daily I asked patient to call [facility] and get the prescription expedited He is to call me when he starts back so that we can plan repeat lab work Check blood pressure after resting at least 5 minutes and bring in blood pressure cuff next visit for validation Follow-up in 6 weeks

## 2024-05-08 NOTE — Patient Instructions (Addendum)
 Your blood pressure goal is < 130/34mmHg   Please call [facility] about your valsartan . Please ask them to expedite it. Remind them you need the 160mg  tablets.  Please continue valsartan  160mg  daily, amlodipine  5mg  daily, furosemide  40mg  daily, spironolactone  25mg  daily, metoprolol  succinate 50g daily  Make sure you rest 5 min prior to checking blood pressure  Please bring your blood pressure cuff to your next visit  Important lifestyle changes to control high blood pressure  Intervention  Effect on the BP   Weight loss Weight loss is one of the most effective lifestyle changes for controlling blood pressure. If you're overweight or obese, losing even a small amount of weight can help reduce blood pressure.    Blood pressure can decrease by 1 millimeter of mercury (mmHg) with each kilogram (about 2.2 pounds) of weight lost.   Exercise regularly As a general goal, aim for 30 minutes of moderate physical activity every day.    Regular physical activity can lower blood pressure by 5 - 8 mmHg.   Eat a healthy diet Eat a diet rich in whole grains, fruits, vegetables, lean meat, and low-fat dairy products. Limit processed foods, saturated fat, and sweets.    A heart-healthy diet can lower high blood pressure by 10 mmHg.   Reduce salt (sodium) in your diet Aim for 000mg  of sodium each day. Avoid deli meats, canned food, and frozen microwave meals which are high in sodium.     Limiting sodium can reduce blood pressure by 5 mmHg.   Limit alcohol One drink equals 12 ounces of beer, 5 ounces of wine, or 1.5 ounces of 80-proof liquor.    Limiting alcohol to < 1 drink a day for women or < 2 drinks a day for men can help lower blood pressure by about 4 mmHg.   To check your pressure at home you will need to:   Sit up in a chair, with feet flat on the floor and back supported. Do not cross your ankles or legs. Rest your left arm so that the cuff is about heart level. If the cuff  goes on your upper arm, then just relax your arm on the table, arm of the chair, or your lap. If you have a wrist cuff, hold your wrist against your chest at heart level. Place the cuff snugly around your arm, about 1 inch above the crease of your elbow. The cords should be inside the groove of your elbow.  Sit quietly, with the cuff in place, for about 5 minutes. Then press the power button to start a reading. Do not talk or move while the reading is taking place.  Record your readings on a sheet of paper. Although most cuffs have a memory, it is often easier to see a pattern developing when the numbers are all in front of you.  You can repeat the reading after 1-3 minutes if it is recommended.   Make sure your bladder is empty and you have not had caffeine or tobacco within the last 30 minutes   Always bring your blood pressure log with you to your appointments. If you have not brought your monitor in to be double checked for accuracy, please bring it to your next appointment.   You can find a list of validated (accurate) blood pressure cuffs at: validatebp.org

## 2024-05-08 NOTE — Progress Notes (Signed)
 Patient ID: Matthew Schmitt                 DOB: 07-20-1951                      MRN: [id number]      HPI: Matthew Schmitt is a 74 y.o. male referred by Dr. Renna Schmitt to HTN clinic. PMH is significant for aortic stenosis, afib, HTN, prostate cancer, HFrecEF (current EF 45-50%).  Patient was seen by Dr. Renna Schmitt 03/10/24. His blood pressure was 190/100. Patient reports that he had missed 4 days of medications while on a fishing trip. Valsartan  was increased to 320mg  and amlodipine  5mg  was added. Of note, patient prefers to avoid diuretics due to urinary incontinence. However on 5/14 patient called reporting SOB and swelling in the upper torso and legs. Furosemide  40mg  daily was added.  Patient presents today for follow-up.  Reports that he does not take furosemide  every single day.  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  He takes about 5 days out of the week.  Swelling improves with the furosemide .  Will notice a difference and increase in fluid when he is off for a couple days.  On exam he does have pitting edema.  Today was the first day and 2 days that he is taken furosemide .  He had prostate cancer and a prostatectomy.  They found another lesion and will begin radiation therapy.  Also received a hormone injection.  He reports he has been out of valsartan  for at least a week.  Prescriptions were sent to [facility] but patient states he did not receive.  He was advised to call today to get it expedited. He reports mild dizziness occasionally.  Shortness of breath on exertion.  Sleeps in a recliner therefore never really lies flat.  He brings in a list of home blood pressure readings.  Interestingly his blood pressure has been lower over the last week or so off of the valsartan .  However were unsure exactly when he ran out and we may be seeing the effect of the addition of furosemide  . His home cuff has not been validated and is at least 73 years  old.  He is checking at 6 AM prior to his medications and without resting.   Current HTN meds: valsartan  320mg  daily (has been off for at least a week), amlodipine  5mg  daily, furosemide  40mg  daily, spironolactone  25mg  daily, metoprolol  succinate 50g daily Previously tried:  BP goal: <130/80  Family History:  Family History  Problem Relation Age of Onset   Arthritis Mother    Hypertension Mother    Arthritis Father    Hypertension Father    Diabetes Father    Heart attack Brother 45   Diabetes Brother    Cancer Maternal Uncle        unknown type   Prostate cancer Paternal Uncle 34 - 55       metastatic     Social History:  Social History   Socioeconomic History   Marital status: Married    Spouse name: Not on file   Number of children: 2   Years of education: 16   Highest education level: Not on file  Occupational History   Occupation: Emergency planning/management officer, Mining engineer  Tobacco Use   Smoking status: Former    Current packs/day: 0.00    Average packs/day: 1 pack/day for 6.0 years (6.0  ttl pk-yrs)    Types: Cigarettes    Start date: 11/01/1963    Quit date: 10/31/1969    Years since quitting: 54.5   Smokeless tobacco: Never  Vaping Use   Vaping status: Never Used  Substance and Sexual Activity   Alcohol use: Yes    Alcohol/week: 0.0 standard drinks of alcohol    Comment: OCCASIONAL   Drug use: No   Sexual activity: Never  Other Topics Concern   Not on file  Social History Narrative   Regular exercise-no   Social Drivers of Health   Financial Resource Strain: Not on file  Food Insecurity: No Food Insecurity (04/24/2024)   Hunger Vital Sign    Worried About Running Out of Food in the Last Year: Never true    Ran Out of Food in the Last Year: Never true  Transportation Needs: No Transportation Needs (04/24/2024)   PRAPARE - Administrator, Civil Service (Medical): No    Lack of Transportation (Non-Medical): No  Physical Activity: Not on file   Stress: Not on file  Social Connections: Not on file  Intimate Partner Violence: Not At Risk (04/24/2024)   Humiliation, Afraid, Rape, and Kick questionnaire    Fear of Current or Ex-Partner: No    Emotionally Abused: No    Physically Abused: No    Sexually Abused: No     Diet: not discussed today  Exercise: not discussed today   Home BP readings:   Date SBP/DBP  HR  6/3 149/81 75  6/4 157/87 58  6/6 162/84 69  6/10 157/78 54  6/11 171/89 72  6/12 154/84 68  6/18 181/97 66  Average      Wt Readings from Last 3 Encounters:  04/24/24 230 lb 2 oz (104.4 kg)  03/10/24 239 lb (108.4 kg)  02/19/24 235 lb (106.6 kg)   BP Readings from Last 3 Encounters:  05/08/24 (!) 140/64  04/24/24 (!) 154/88  03/10/24 (!) 190/100   Pulse Readings from Last 3 Encounters:  05/08/24 66  04/24/24 72  03/10/24 83    Renal function: CrCl cannot be calculated (Patient's most recent lab result is older than the maximum 21 days allowed.).  Past Medical History:  Diagnosis Date   Anxiety    Arthritis    everywhere   Arthrosis of right acromioclavicular joint 01/29/2020   Atrial flutter [facility])    Onset May 2014 Cardioversion done    BPH associated with nocturia    BPH with obstruction/lower urinary tract symptoms 01/04/2017   CAD (coronary artery disease) 04/02/2013   Cath 2012 moderate mid LAD lesion, otherwise not much disease    Cancer (HCC)    Diabetes (HCC) 01/18/2018   Dilated aortic root (HCC)    aortic root 44 mm, ascending aorta 46 mm 12/22/21 echo   ED (erectile dysfunction)    Elevated PSA    Encounter for long-term (current) use of other medications 07/27/2013   Essential hypertension, benign    First degree heart block    Heart failure with mildly reduced ejection fraction (HFmrEF, 41-49%) (HCC) 08/18/2021   Hx of tachycardia mediated cardiomyopathy  TTE 12/27/2020: EF 20-25 TTE 05/10/2021: EF 30-35 TTE 12/22/2021: EF 50-55 (after CTI ablation) TTE 02/19/2023: EF 45-50,  global HK, GR 2 DD, normal RVSF, severe LAE, mild MR, mild-moderate AI, moderate aortic stenosis (V-max 300 cm/s, mean gradient 19 mmHg, DI 0.33), ascending aorta 4.2 cm, RAP 3    Heart murmur    mild-moderate AS  and AR 01/11/22   History of bilateral knee replacement 02/07/2017   History of bladder stone    History of kidney stones    History of melanoma excision    June 2016  --- s/p  excision right  leg (per pt localized and no recurrence)   Hypertensive heart disease 04/02/2013   Impingement syndrome of right shoulder 01/29/2020   Impotence of organic origin    Left-sided low back pain with left-sided sciatica 01/23/2019   Long term current use of anticoagulant therapy    Mixed hyperlipidemia    slightly   Multiple renal cysts    bilateral   Nephrolithiasis 05/17/2014   Nonischemic cardiomyopathy (HCC)    Osteoarthritis    PAF (paroxysmal atrial fibrillation) (HCC)    followed by cardiology  (takes ASA)   Peripheral vascular disease (HCC)    RBBB    RBBB (right bundle branch block with left anterior fascicular block) 04/02/2013   Renal oncocytoma of left kidney 06/18/2013   s/p left robotic assited laparoscopic partial nephrectomy 06/18/13   Right ureteral stone    Rotator cuff syndrome of left shoulder 04/17/2016   Status post total left knee replacement 01/01/2020   Tendinopathy of right biceps tendon 01/29/2020   Tendinopathy of right rotator cuff 01/29/2020   Type 2 diabetes mellitus treated with insulin  [facility])    endocrinologist-  dr Washington Hacker   Wears glasses     Current Outpatient Medications on File Prior to Visit  Medication Sig Dispense Refill   aspirin  EC 81 MG tablet Take 1 tablet (81 mg total) by mouth daily. Swallow whole.     dapagliflozin  propanediol (FARXIGA ) 10 MG TABS tablet Take 1 tablet (10 mg total) by mouth daily before breakfast. 90 tablet 3   furosemide  (LASIX ) 40 MG tablet Take 1 tablet (40 mg total) by mouth daily. 90 tablet 3   insulin  degludec  (TRESIBA  FLEXTOUCH) 200 UNIT/ML FlexTouch Pen Inject 90 Units into the skin daily. 45 mL 2   Insulin  Lispro-aabc (LYUMJEV  KWIKPEN) 200 UNIT/ML KwikPen Inject 6-10 Units into the skin 3 (three) times daily before meals. Inject 6-10 units before meals, under skin 18 mL 2   metoprolol  succinate (TOPROL -XL) 50 MG 24 hr tablet Take 1 tablet (50 mg total) by mouth daily. Take with or immediately following a meal. 90 tablet 3   spironolactone  (ALDACTONE ) 25 MG tablet Take 1 tablet (25 mg total) by mouth daily. 90 tablet 3   glucose blood (ONETOUCH ULTRA) test strip Use to check blood sugar 1-2 times a day 200 strip 11   Insulin  Pen Needle 32G X 4 MM MISC Use 4x a day 400 each 3   Lancets (ONETOUCH DELICA PLUS LANCET33G) MISC 1 each by Other route 2 (two) times daily. E11.9 200 each 3   nitroGLYCERIN  (NITROSTAT ) 0.4 MG SL tablet Place 0.4 mg under the tongue every 5 (five) minutes x 3 doses as needed for chest pain.     trolamine salicylate (ASPERCREME) 10 % cream Apply 1 application  topically 3 (three) times daily as needed for muscle pain.     No current facility-administered medications on file prior to visit.    Allergies  Allergen Reactions   Actos [Pioglitazone] Swelling and Cough    SWELLING REACTION UNSPECIFIED  EDEMA    Blood pressure (!) 140/64, pulse 66.   Assessment/Plan: HYPERTENSION CONTROL Vitals:   05/08/24 0812 05/08/24 0817  BP: (!) 140/72 (!) 140/64    The patient's blood pressure is elevated above  target today.  In order to address the patient's elevated BP: A new medication was prescribed today.      1. Hypertension -  Hypertensive heart disease Assessment: Blood pressure today in clinic above goal but much better than reported home readings Patient's home blood pressure cuff has not been validated.  Patient also checking blood pressure prior to morning medications and without resting He has been out of valsartan  for at least a week Takes furosemide  about 4-5  times per week No lab work since increasing valsartan  (although he has been off for over a week) or adding furosemide  Reports mild dizziness on occasion  Plan: Check BMP today Restart valsartan  but at 160 mg daily Continue amlodipine  5mg  daily, furosemide  40mg  daily, spironolactone  25mg  daily, metoprolol  succinate 50g daily I asked patient to call [facility] and get the prescription expedited He is to call me when he starts back so that we can plan repeat lab work Check blood pressure after resting at least 5 minutes and bring in blood pressure cuff next visit for validation Follow-up in 6 weeks   Thank you  Adele Milson D Brienne Liguori, Pharm.Monika Annas, CPP [facility] [address] [place], [place] [address]  Phone: [phone]; Fax: [phone]

## 2024-05-09 LAB — BASIC METABOLIC PANEL WITH GFR
BUN/Creatinine Ratio: 24 (ref 10–24)
BUN: 28 mg/dL — ABNORMAL HIGH (ref 8–27)
CO2: 20 mmol/L (ref 20–29)
Calcium: 9.1 mg/dL (ref 8.6–10.2)
Chloride: 103 mmol/L (ref 96–106)
Creatinine, Ser: 1.18 mg/dL (ref 0.76–1.27)
Glucose: 46 mg/dL — ABNORMAL LOW (ref 70–99)
Potassium: 4.2 mmol/L (ref 3.5–5.2)
Sodium: 144 mmol/L (ref 134–144)
eGFR: 65 mL/min/{1.73_m2} (ref >59)

## 2024-05-11 ENCOUNTER — Ambulatory Visit: Payer: Self-pay | Admitting: Pharmacist

## 2024-05-11 ENCOUNTER — Telehealth: Payer: Self-pay | Admitting: Pharmacist

## 2024-05-11 NOTE — Telephone Encounter (Signed)
 Patient made aware of results. No med changes needed. He denies any low BG at home. AM usually 100-130. Does not take insulin  if <100

## 2024-05-13 ENCOUNTER — Ambulatory Visit
Admission: RE | Admit: 2024-05-13 | Discharge: 2024-05-13 | Disposition: A | Discharge status: Home / Self Care | Source: Ambulatory Visit | Attending: Radiation Oncology

## 2024-05-13 ENCOUNTER — Other Ambulatory Visit: Payer: Self-pay

## 2024-05-13 DIAGNOSIS — Z510 Encounter for antineoplastic radiation therapy: Secondary | ICD-10-CM | POA: Diagnosis not present

## 2024-05-13 LAB — RAD ONC ARIA SESSION SUMMARY
Course Elapsed Days: 0
Plan Fractions Treated to Date: 1
Plan Prescribed Dose Per Fraction: 1.8 Gy
Plan Total Fractions Prescribed: 25
Plan Total Prescribed Dose: 45 Gy
Reference Point Dosage Given to Date: 1.8 Gy
Reference Point Session Dosage Given: 1.8 Gy
Session Number: 1

## 2024-05-14 ENCOUNTER — Other Ambulatory Visit: Payer: Self-pay

## 2024-05-14 ENCOUNTER — Ambulatory Visit
Admission: RE | Admit: 2024-05-14 | Discharge: 2024-05-14 | Disposition: A | Discharge status: Home / Self Care | Source: Ambulatory Visit | Attending: Radiation Oncology | Admitting: Radiation Oncology

## 2024-05-14 DIAGNOSIS — Z510 Encounter for antineoplastic radiation therapy: Secondary | ICD-10-CM | POA: Diagnosis not present

## 2024-05-14 LAB — RAD ONC ARIA SESSION SUMMARY
Course Elapsed Days: 1
Plan Fractions Treated to Date: 2
Plan Prescribed Dose Per Fraction: 1.8 Gy
Plan Total Fractions Prescribed: 25
Plan Total Prescribed Dose: 45 Gy
Reference Point Dosage Given to Date: 3.6 Gy
Reference Point Session Dosage Given: 1.8 Gy
Session Number: 2

## 2024-05-15 ENCOUNTER — Ambulatory Visit

## 2024-05-18 ENCOUNTER — Ambulatory Visit
Admission: RE | Admit: 2024-05-18 | Discharge: 2024-05-18 | Disposition: A | Discharge status: Home / Self Care | Source: Ambulatory Visit | Attending: Radiation Oncology

## 2024-05-18 ENCOUNTER — Other Ambulatory Visit: Payer: Self-pay

## 2024-05-18 ENCOUNTER — Ambulatory Visit
Admission: RE | Admit: 2024-05-18 | Discharge: 2024-05-18 | Discharge status: Home / Self Care | Source: Ambulatory Visit | Attending: Radiation Oncology

## 2024-05-18 DIAGNOSIS — Z510 Encounter for antineoplastic radiation therapy: Secondary | ICD-10-CM | POA: Diagnosis not present

## 2024-05-18 LAB — RAD ONC ARIA SESSION SUMMARY
Course Elapsed Days: 5
Plan Fractions Treated to Date: 3
Plan Prescribed Dose Per Fraction: 1.8 Gy
Plan Total Fractions Prescribed: 25
Plan Total Prescribed Dose: 45 Gy
Reference Point Dosage Given to Date: 5.4 Gy
Reference Point Session Dosage Given: 1.8 Gy
Session Number: 3

## 2024-05-19 ENCOUNTER — Ambulatory Visit
Admission: RE | Admit: 2024-05-19 | Discharge: 2024-05-19 | Disposition: A | Discharge status: Home / Self Care | Source: Ambulatory Visit | Attending: Radiation Oncology | Admitting: Radiation Oncology

## 2024-05-19 ENCOUNTER — Other Ambulatory Visit: Payer: Self-pay

## 2024-05-19 DIAGNOSIS — C7951 Secondary malignant neoplasm of bone: Secondary | ICD-10-CM | POA: Diagnosis not present

## 2024-05-19 DIAGNOSIS — C61 Malignant neoplasm of prostate: Secondary | ICD-10-CM | POA: Insufficient documentation

## 2024-05-19 DIAGNOSIS — Z510 Encounter for antineoplastic radiation therapy: Secondary | ICD-10-CM | POA: Diagnosis present

## 2024-05-19 LAB — RAD ONC ARIA SESSION SUMMARY
Course Elapsed Days: 6
Plan Fractions Treated to Date: 4
Plan Prescribed Dose Per Fraction: 1.8 Gy
Plan Total Fractions Prescribed: 25
Plan Total Prescribed Dose: 45 Gy
Reference Point Dosage Given to Date: 7.2 Gy
Reference Point Session Dosage Given: 1.8 Gy
Session Number: 4

## 2024-05-20 ENCOUNTER — Other Ambulatory Visit: Payer: Self-pay

## 2024-05-20 ENCOUNTER — Ambulatory Visit
Admission: RE | Admit: 2024-05-20 | Discharge: 2024-05-20 | Disposition: A | Discharge status: Home / Self Care | Source: Ambulatory Visit | Attending: Radiation Oncology

## 2024-05-20 DIAGNOSIS — Z510 Encounter for antineoplastic radiation therapy: Secondary | ICD-10-CM | POA: Diagnosis not present

## 2024-05-20 LAB — RAD ONC ARIA SESSION SUMMARY
Course Elapsed Days: 7
Plan Fractions Treated to Date: 5
Plan Prescribed Dose Per Fraction: 1.8 Gy
Plan Total Fractions Prescribed: 25
Plan Total Prescribed Dose: 45 Gy
Reference Point Dosage Given to Date: 9 Gy
Reference Point Session Dosage Given: 1.8 Gy
Session Number: 5

## 2024-05-21 ENCOUNTER — Ambulatory Visit
Admission: RE | Admit: 2024-05-21 | Discharge: 2024-05-21 | Disposition: A | Discharge status: Home / Self Care | Source: Ambulatory Visit | Attending: Radiation Oncology | Admitting: Radiation Oncology

## 2024-05-21 ENCOUNTER — Other Ambulatory Visit: Payer: Self-pay

## 2024-05-21 DIAGNOSIS — Z510 Encounter for antineoplastic radiation therapy: Secondary | ICD-10-CM | POA: Diagnosis not present

## 2024-05-21 LAB — RAD ONC ARIA SESSION SUMMARY
Course Elapsed Days: 8
Plan Fractions Treated to Date: 6
Plan Prescribed Dose Per Fraction: 1.8 Gy
Plan Total Fractions Prescribed: 25
Plan Total Prescribed Dose: 45 Gy
Reference Point Dosage Given to Date: 10.8 Gy
Reference Point Session Dosage Given: 1.8 Gy
Session Number: 6

## 2024-05-25 ENCOUNTER — Ambulatory Visit
Admission: RE | Admit: 2024-05-25 | Discharge: 2024-05-25 | Disposition: A | Discharge status: Home / Self Care | Source: Ambulatory Visit | Attending: Radiation Oncology | Admitting: Radiation Oncology

## 2024-05-25 ENCOUNTER — Other Ambulatory Visit: Payer: Self-pay

## 2024-05-25 DIAGNOSIS — Z510 Encounter for antineoplastic radiation therapy: Secondary | ICD-10-CM | POA: Diagnosis not present

## 2024-05-25 LAB — RAD ONC ARIA SESSION SUMMARY
Course Elapsed Days: 12
Plan Fractions Treated to Date: 7
Plan Prescribed Dose Per Fraction: 1.8 Gy
Plan Total Fractions Prescribed: 25
Plan Total Prescribed Dose: 45 Gy
Reference Point Dosage Given to Date: 12.6 Gy
Reference Point Session Dosage Given: 1.8 Gy
Session Number: 7

## 2024-05-26 ENCOUNTER — Ambulatory Visit
Admission: RE | Admit: 2024-05-26 | Discharge: 2024-05-26 | Disposition: A | Discharge status: Home / Self Care | Source: Ambulatory Visit | Attending: Radiation Oncology | Admitting: Radiation Oncology

## 2024-05-26 ENCOUNTER — Other Ambulatory Visit: Payer: Self-pay

## 2024-05-26 DIAGNOSIS — Z510 Encounter for antineoplastic radiation therapy: Secondary | ICD-10-CM | POA: Diagnosis not present

## 2024-05-26 LAB — RAD ONC ARIA SESSION SUMMARY
Course Elapsed Days: 13
Plan Fractions Treated to Date: 8
Plan Prescribed Dose Per Fraction: 1.8 Gy
Plan Total Fractions Prescribed: 25
Plan Total Prescribed Dose: 45 Gy
Reference Point Dosage Given to Date: 14.4 Gy
Reference Point Session Dosage Given: 1.8 Gy
Session Number: 8

## 2024-05-27 ENCOUNTER — Other Ambulatory Visit: Payer: Self-pay

## 2024-05-27 ENCOUNTER — Ambulatory Visit
Admission: RE | Admit: 2024-05-27 | Discharge: 2024-05-27 | Disposition: A | Discharge status: Home / Self Care | Source: Ambulatory Visit | Attending: Radiation Oncology | Admitting: Radiation Oncology

## 2024-05-27 DIAGNOSIS — Z510 Encounter for antineoplastic radiation therapy: Secondary | ICD-10-CM | POA: Diagnosis not present

## 2024-05-27 LAB — RAD ONC ARIA SESSION SUMMARY
Course Elapsed Days: 14
Plan Fractions Treated to Date: 9
Plan Prescribed Dose Per Fraction: 1.8 Gy
Plan Total Fractions Prescribed: 25
Plan Total Prescribed Dose: 45 Gy
Reference Point Dosage Given to Date: 16.2 Gy
Reference Point Session Dosage Given: 1.8 Gy
Session Number: 9

## 2024-05-28 ENCOUNTER — Other Ambulatory Visit: Payer: Self-pay

## 2024-05-28 ENCOUNTER — Ambulatory Visit
Admission: RE | Admit: 2024-05-28 | Discharge: 2024-05-28 | Disposition: A | Discharge status: Home / Self Care | Source: Ambulatory Visit | Attending: Radiation Oncology | Admitting: Radiation Oncology

## 2024-05-28 ENCOUNTER — Ambulatory Visit

## 2024-05-28 DIAGNOSIS — Z510 Encounter for antineoplastic radiation therapy: Secondary | ICD-10-CM | POA: Diagnosis not present

## 2024-05-28 LAB — RAD ONC ARIA SESSION SUMMARY
Course Elapsed Days: 15
Plan Fractions Treated to Date: 10
Plan Prescribed Dose Per Fraction: 1.8 Gy
Plan Total Fractions Prescribed: 25
Plan Total Prescribed Dose: 45 Gy
Reference Point Dosage Given to Date: 18 Gy
Reference Point Session Dosage Given: 1.8 Gy
Session Number: 10

## 2024-05-29 ENCOUNTER — Other Ambulatory Visit: Payer: Self-pay

## 2024-05-29 ENCOUNTER — Ambulatory Visit
Admission: RE | Admit: 2024-05-29 | Discharge: 2024-05-29 | Disposition: A | Discharge status: Home / Self Care | Source: Ambulatory Visit | Attending: Radiation Oncology

## 2024-05-29 DIAGNOSIS — Z510 Encounter for antineoplastic radiation therapy: Secondary | ICD-10-CM | POA: Diagnosis not present

## 2024-05-29 LAB — RAD ONC ARIA SESSION SUMMARY
Course Elapsed Days: 16
Plan Fractions Treated to Date: 11
Plan Prescribed Dose Per Fraction: 1.8 Gy
Plan Total Fractions Prescribed: 25
Plan Total Prescribed Dose: 45 Gy
Reference Point Dosage Given to Date: 19.8 Gy
Reference Point Session Dosage Given: 1.8 Gy
Session Number: 11

## 2024-06-01 ENCOUNTER — Ambulatory Visit
Admission: RE | Admit: 2024-06-01 | Discharge: 2024-06-01 | Disposition: A | Discharge status: Home / Self Care | Source: Ambulatory Visit | Attending: Radiation Oncology | Admitting: Radiation Oncology

## 2024-06-01 ENCOUNTER — Other Ambulatory Visit: Payer: Self-pay

## 2024-06-01 DIAGNOSIS — Z510 Encounter for antineoplastic radiation therapy: Secondary | ICD-10-CM | POA: Diagnosis not present

## 2024-06-01 LAB — RAD ONC ARIA SESSION SUMMARY
Course Elapsed Days: 19
Plan Fractions Treated to Date: 12
Plan Prescribed Dose Per Fraction: 1.8 Gy
Plan Total Fractions Prescribed: 25
Plan Total Prescribed Dose: 45 Gy
Reference Point Dosage Given to Date: 21.6 Gy
Reference Point Session Dosage Given: 1.8 Gy
Session Number: 12

## 2024-06-02 ENCOUNTER — Ambulatory Visit
Admission: RE | Admit: 2024-06-02 | Discharge: 2024-06-02 | Disposition: A | Discharge status: Home / Self Care | Source: Ambulatory Visit | Attending: Radiation Oncology | Admitting: Radiation Oncology

## 2024-06-02 ENCOUNTER — Other Ambulatory Visit: Payer: Self-pay

## 2024-06-02 DIAGNOSIS — Z510 Encounter for antineoplastic radiation therapy: Secondary | ICD-10-CM | POA: Diagnosis not present

## 2024-06-02 LAB — RAD ONC ARIA SESSION SUMMARY
Course Elapsed Days: 20
Plan Fractions Treated to Date: 13
Plan Prescribed Dose Per Fraction: 1.8 Gy
Plan Total Fractions Prescribed: 25
Plan Total Prescribed Dose: 45 Gy
Reference Point Dosage Given to Date: 23.4 Gy
Reference Point Session Dosage Given: 1.8 Gy
Session Number: 13

## 2024-06-03 ENCOUNTER — Ambulatory Visit
Admission: RE | Admit: 2024-06-03 | Discharge: 2024-06-03 | Disposition: A | Discharge status: Home / Self Care | Source: Ambulatory Visit | Attending: Radiation Oncology | Admitting: Radiation Oncology

## 2024-06-03 ENCOUNTER — Other Ambulatory Visit: Payer: Self-pay

## 2024-06-03 DIAGNOSIS — Z510 Encounter for antineoplastic radiation therapy: Secondary | ICD-10-CM | POA: Diagnosis not present

## 2024-06-03 LAB — RAD ONC ARIA SESSION SUMMARY
Course Elapsed Days: 21
Plan Fractions Treated to Date: 14
Plan Prescribed Dose Per Fraction: 1.8 Gy
Plan Total Fractions Prescribed: 25
Plan Total Prescribed Dose: 45 Gy
Reference Point Dosage Given to Date: 25.2 Gy
Reference Point Session Dosage Given: 1.8 Gy
Session Number: 14

## 2024-06-04 ENCOUNTER — Encounter: Payer: Self-pay | Admitting: Internal Medicine

## 2024-06-04 ENCOUNTER — Other Ambulatory Visit: Payer: Self-pay

## 2024-06-04 ENCOUNTER — Ambulatory Visit
Admission: RE | Admit: 2024-06-04 | Discharge: 2024-06-04 | Disposition: A | Discharge status: Home / Self Care | Source: Ambulatory Visit | Attending: Radiation Oncology | Admitting: Radiation Oncology

## 2024-06-04 ENCOUNTER — Ambulatory Visit: Admitting: Internal Medicine

## 2024-06-04 VITALS — BP 142/70 | HR 80 | Ht 71.0 in | Wt 234.0 lb

## 2024-06-04 DIAGNOSIS — E785 Hyperlipidemia, unspecified: Secondary | ICD-10-CM

## 2024-06-04 DIAGNOSIS — E1165 Type 2 diabetes mellitus with hyperglycemia: Secondary | ICD-10-CM | POA: Diagnosis not present

## 2024-06-04 DIAGNOSIS — Z794 Long term (current) use of insulin: Secondary | ICD-10-CM

## 2024-06-04 DIAGNOSIS — Z510 Encounter for antineoplastic radiation therapy: Secondary | ICD-10-CM | POA: Diagnosis not present

## 2024-06-04 DIAGNOSIS — E1159 Type 2 diabetes mellitus with other circulatory complications: Secondary | ICD-10-CM | POA: Diagnosis not present

## 2024-06-04 DIAGNOSIS — L97519 Non-pressure chronic ulcer of other part of right foot with unspecified severity: Secondary | ICD-10-CM | POA: Diagnosis not present

## 2024-06-04 DIAGNOSIS — Z7984 Long term (current) use of oral hypoglycemic drugs: Secondary | ICD-10-CM

## 2024-06-04 LAB — RAD ONC ARIA SESSION SUMMARY
Course Elapsed Days: 22
Plan Fractions Treated to Date: 15
Plan Prescribed Dose Per Fraction: 1.8 Gy
Plan Total Fractions Prescribed: 25
Plan Total Prescribed Dose: 45 Gy
Reference Point Dosage Given to Date: 27 Gy
Reference Point Session Dosage Given: 1.8 Gy
Session Number: 15

## 2024-06-04 LAB — POCT GLYCOSYLATED HEMOGLOBIN (HGB A1C): Hemoglobin A1C: 6.5 % — AB (ref 4.0–5.6)

## 2024-06-04 NOTE — Patient Instructions (Addendum)
 Please use the following regimen: - Farxiga  10 mg before dinner - Tresiba  90 units daily - Lyumjev  6 units before dinner  Check some sugars later in the day - midday and bedtime.  Please return in 3 months with your sugar log.

## 2024-06-04 NOTE — Progress Notes (Signed)
 Patient ID: TREVIN GARTRELL, male   DOB: Mar 06, 1951, 73 y.o.   MRN: [id number]  HPI: Matthew Schmitt is a 73 y.o.-year-old male, returning for follow-up for DM2, dx in 2002, insulin -dependent since 2017, uncontrolled, with complications (CAD, nonischemic cardiomyopathy, atrial flutter/PAF). Pt. previously saw Dr. Kassie, but last visit with me 6 months ago.  Interim history: No blurry vision, nausea, chest pain.  He does have increased urination and tells me he is incontinent.  He cannot provide a urine sample today. Last year he was diagnosed with Pr CA. He had a robotic radical prostatectomy 04/22/2023.  He then developed bladder spasms and incontinence.  He is now undergoing radiation therapy.  He had a PET scan on 04/21/2024 that showed a single skeletal metastasis in the right iliac bone.  Reviewed HbA1c: Lab Results  Component Value Date   HGBA1C 8.1 (A) 12/03/2023   HGBA1C 8.6 (A) 07/11/2023   HGBA1C 7.6 (H) 04/10/2023   HGBA1C 7.6 (A) 01/30/2023   HGBA1C 8.7 (A) 11/15/2022   HGBA1C 7.4 (H) 06/13/2022   HGBA1C 7.9 (H) 05/04/2022   HGBA1C 8.6 (H) 04/06/2022   HGBA1C 8.5 (H) 03/06/2022   HGBA1C 8.0 (A) 01/05/2022   At last visit, he was on: - Farxiga  10 mg in am - Rybelsus  3 mg before b'fast - added 06/2022 >> stopped b/c no refills - Basaglar  90 units at bedtime He developed cough and edema from Actos. He tried Metformin  >> gained weight.  He tried Trulicity  before  - no GI sxs.  We changed to: - Tresiba  90 units daily - Farxiga  10 mg in am >> in the evening (before dinner) - Lyumjev  6 to 10 units before each meal  >> Lyumjev  6 units ONLY before dinner Previously on Ozempic  0.5 but stopped due to price and did not restart as he did not feel that this was helping too much.  Pt checks his sugars 1x a day and they are: - am:108, 126-290 >> 103-172, 222 (icecream) >> 140-200 >> 131-191, 279 >> 77-179, 189 - 2h after b'fast: n/c >> 155 >> n/c - before lunch: n/c - 2h after lunch:  n/c - before dinner: n/c - 2h after dinner: n/c - bedtime: n/c - nighttime: n/c Lowest sugar was 22  - years ago; more recently: 103 >> 140 >> 131 >> 77 Highest sugar was 312 >> ... 279 >> 189   Glucometer: One Touch ultra  - + Mild CKD, last BUN/creatinine:  Lab Results  Component Value Date   BUN 28 (H) 05/08/2024   BUN 17 12/12/2023   CREATININE 1.18 05/08/2024   CREATININE 1.02 12/12/2023   Lab Results  Component Value Date   MICRALBCREAT 17.4 09/20/2014  On Entresto .  -+ HL; last set of lipids: Lab Results  Component Value Date   CHOL 143 12/03/2023   HDL 49 12/03/2023   LDLCALC 78 12/03/2023   TRIG 79 12/03/2023   CHOLHDL 2.9 12/03/2023  On Crestor  20 mg daily.  - last eye exam was on 04/10/2023. No DR.  - no numbness and tingling in his feet.  Last foot exam 12/03/2023.  He also has a history of nephrolithiasis, right bundle branch block. He also has atrial flutter.  ROS: + see HPI  Past Medical History:  Diagnosis Date   Anxiety    Arthritis    everywhere   Arthrosis of right acromioclavicular joint 01/29/2020   Atrial flutter [facility])    Onset May 2014 Cardioversion done    BPH  associated with nocturia    BPH with obstruction/lower urinary tract symptoms 01/04/2017   CAD (coronary artery disease) 04/02/2013   Cath 2012 moderate mid LAD lesion, otherwise not much disease    Cancer (HCC)    Diabetes (HCC) 01/18/2018   Dilated aortic root (HCC)    aortic root 44 mm, ascending aorta 46 mm 12/22/21 echo   ED (erectile dysfunction)    Elevated PSA    Encounter for long-term (current) use of other medications 07/27/2013   Essential hypertension, benign    First degree heart block    Heart failure with mildly reduced ejection fraction (HFmrEF, 41-49%) (HCC) 08/18/2021   Hx of tachycardia mediated cardiomyopathy  TTE 12/27/2020: EF 20-25 TTE 05/10/2021: EF 30-35 TTE 12/22/2021: EF 50-55 (after CTI ablation) TTE 02/19/2023: EF 45-50, global HK, GR 2 DD, normal  RVSF, severe LAE, mild MR, mild-moderate AI, moderate aortic stenosis (V-max 300 cm/s, mean gradient 19 mmHg, DI 0.33), ascending aorta 4.2 cm, RAP 3    Heart murmur    mild-moderate AS and AR 01/11/22   History of bilateral knee replacement 02/07/2017   History of bladder stone    History of kidney stones    History of melanoma excision    June 2016  --- s/p  excision right  leg (per pt localized and no recurrence)   Hypertensive heart disease 04/02/2013   Impingement syndrome of right shoulder 01/29/2020   Impotence of organic origin    Left-sided low back pain with left-sided sciatica 01/23/2019   Long term current use of anticoagulant therapy    Mixed hyperlipidemia    slightly   Multiple renal cysts    bilateral   Nephrolithiasis 05/17/2014   Nonischemic cardiomyopathy (HCC)    Osteoarthritis    PAF (paroxysmal atrial fibrillation) (HCC)    followed by cardiology  (takes ASA)   Peripheral vascular disease (HCC)    RBBB    RBBB (right bundle branch block with left anterior fascicular block) 04/02/2013   Renal oncocytoma of left kidney 06/18/2013   s/p left robotic assited laparoscopic partial nephrectomy 06/18/13   Right ureteral stone    Rotator cuff syndrome of left shoulder 04/17/2016   Status post total left knee replacement 01/01/2020   Tendinopathy of right biceps tendon 01/29/2020   Tendinopathy of right rotator cuff 01/29/2020   Type 2 diabetes mellitus treated with insulin  [facility])    endocrinologist-  dr kassie   Wears glasses    Past Surgical History:  Procedure Laterality Date   A-FLUTTER ABLATION N/A 10/27/2021   Procedure: A-FLUTTER ABLATION;  Surgeon: Cindie Ole DASEN, MD;  Location: [facility] INVASIVE CV LAB;  Service: Cardiovascular;  Laterality: N/A;   CARDIAC CATHETERIZATION  08-17-2011  DR JACQUES TILLEY   POSSIBLE MODERATE LAD STENOSIS JUST AFTER THE BIFURCATION OF LARGE DIAGONAL BRANCH/ LVEF  40-45%/ MILD GLOBAL HYPODINESIS (CATH DONE FOR ABNORMAL STRESS  TEST OF FIXED LATERAL WALL DEFECT & BIFASCICULAR BLAOCK)   CARDIOVERSION N/A 04/02/2013   Procedure: CARDIOVERSION;  Surgeon: Elsie GORMAN Somerset, MD;  Location: [facility] OR;  Service: Cardiovascular;  Laterality: N/A;   CARDIOVERSION N/A 12/27/2020   Procedure: CARDIOVERSION;  Surgeon: Raford Riggs, MD;  Location: [facility] ENDOSCOPY;  Service: Cardiovascular;  Laterality: N/A;   CYSTOSCOPY WITH LITHOLAPAXY N/A 05/18/2015   Procedure: CYSTOSCOPY WITH LITHOLAPAXY;  Surgeon: Alm Fragmin, MD;  Location: [facility];  Service: Urology;  Laterality: N/A;   CYSTOSCOPY WITH LITHOLAPAXY N/A 01/04/2017   Procedure: CYSTOSCOPY WITH LITHOLAPAXY, Holmium Laser;  Surgeon:  Morene LELON Salines, MD;  Location: [facility] ORS;  Service: Urology;  Laterality: N/A;   CYSTOSCOPY WITH RETROGRADE PYELOGRAM, URETEROSCOPY AND STENT PLACEMENT  11/03/2012   Procedure: CYSTOSCOPY WITH RETROGRADE PYELOGRAM, URETEROSCOPY AND STENT PLACEMENT;  Surgeon: Alm GORMAN Fragmin, MD;  Location: [facility];  Service: Urology;  Laterality: Left;  Cystoscopy/Left Retrograde/Ureteroscopy/Holmium Laser Litho/Double J Stent   CYSTOSCOPY WITH URETEROSCOPY, STONE BASKETRY AND STENT PLACEMENT Right 05/15/2019   Procedure: CYSTOSCOPY WITH URETEROSCOPY, LASER LITHOTRIPSY STONE BASKETRY AND STENT PLACEMENT, RIGHT RETROGRADE;  Surgeon: Salines Morene LELON, MD;  Location: [facility];  Service: Urology;  Laterality: Right;   HOLMIUM LASER APPLICATION  11/03/2012   Procedure: HOLMIUM LASER APPLICATION;  Surgeon: Alm GORMAN Fragmin, MD;  Location: [facility];  Service: Urology;  Laterality: Left;   HOLMIUM LASER APPLICATION N/A 05/18/2015   Procedure: HOLMIUM LASER APPLICATION;  Surgeon: Alm Fragmin, MD;  Location: [facility];  Service: Urology;  Laterality: N/A;   KNEE SURGERY Bilateral 1978  &  1974   LYMPHADENECTOMY Bilateral 04/22/2023   Procedure: BILATERAL PELVIC LYMPHADENECTOMY;  Surgeon:  Renda Glance, MD;  Location: [facility];  Service: Urology;  Laterality: Bilateral;   MELANOMA EXCISION Bilateral 04/2015   20th-- left leg //  1st-- right leg w/ skin graft from right thigh (no lymph node bx)   NEPHROLITHOTOMY Right 05/17/2014   Procedure: NEPHROLITHOTOMY PERCUTANEOUS;  Surgeon: Alm GORMAN Fragmin, MD;  Location: [facility];  Service: Urology;  Laterality: Right;   PILONIDAL CYST EXCISION     PROSTATE BIOPSY     RIGHT/LEFT HEART CATH AND CORONARY ANGIOGRAPHY N/A 09/27/2023   Procedure: RIGHT/LEFT HEART CATH AND CORONARY ANGIOGRAPHY;  Surgeon: Swaziland, Peter M, MD;  Location: [facility] INVASIVE CV LAB;  Service: Cardiovascular;  Laterality: N/A;   ROBOT ASSISTED LAPAROSCOPIC RADICAL PROSTATECTOMY N/A 04/22/2023   Procedure: XI ROBOTIC ASSISTED LAPAROSCOPIC RADICAL PROSTATECTOMY LEVEL 2;  Surgeon: Renda Glance, MD;  Location: [facility];  Service: Urology;  Laterality: N/A;   ROBOTIC ASSITED PARTIAL NEPHRECTOMY Left 06/18/2013   Procedure: ROBOTIC ASSISTED PARTIAL NEPHRECTOMY;  Surgeon: Noretta Renda, MD;  Location: [facility];  Service: Urology;  Laterality: Left;   ROTATOR CUFF REPAIR Left 07/2016   SHOULDER ARTHROSCOPY WITH ROTATOR CUFF REPAIR AND SUBACROMIAL DECOMPRESSION Right 05/27/2020   Procedure: RIGHT SHOULDER ARTHROSCOPY WITH EXTENSIVE DEBRIDEMENT, DISTAL CLAVICLE EXCISION AND SUBACROMIAL DECOMPRESSION;  Surgeon: Jerri Kay HERO, MD;  Location: [facility];  Service: Orthopedics;  Laterality: Right;   TEE WITH CARDIOVERSION  12/27/2020   TEE WITHOUT CARDIOVERSION N/A 12/27/2020   Procedure: TRANSESOPHAGEAL ECHOCARDIOGRAM (TEE);  Surgeon: Raford Riggs, MD;  Location: [facility] ENDOSCOPY;  Service: Cardiovascular;  Laterality: N/A;   TONSILLECTOMY  64  (age 34)   TOTAL KNEE ARTHROPLASTY Left 02/07/2017   Procedure: LEFT TOTAL KNEE ARTHROPLASTY;  Surgeon: Kay HERO Jerri, MD;  Location: [facility] OR;  Service: Orthopedics;  Laterality: Left;   TOTAL KNEE ARTHROPLASTY Right 04/25/2017   Procedure:  RIGHT TOTAL KNEE ARTHROPLASTY;  Surgeon: Jerri Kay HERO, MD;  Location: [facility] OR;  Service: Orthopedics;  Laterality: Right;   TOTAL KNEE REVISION Left 07/13/2022   Procedure: LEFT TOTAL KNEE REVISION, TIBIAL COMPONENT;  Surgeon: Jerri Kay HERO, MD;  Location: [facility] OR;  Service: Orthopedics;  Laterality: Left;   TRANSURETHRAL RESECTION OF PROSTATE N/A 01/04/2017   Procedure: TRANSURETHRAL RESECTION OF THE PROSTATE (TURP);  Surgeon: Morene LELON Salines, MD;  Location: [facility];  Service: Urology;  Laterality: N/A;   Social History  Socioeconomic History   Marital status: Married    Spouse name: Not on file   Number of children: 2   Years of education: 19   Highest education level: Not on file  Occupational History   Occupation: Emergency planning/management officer, Mining engineer  Tobacco Use   Smoking status: Former    Current packs/day: 0.00    Average packs/day: 1 pack/day for 6.0 years (6.0 ttl pk-yrs)    Types: Cigarettes    Start date: 11/01/1963    Quit date: 10/31/1969    Years since quitting: 54.6   Smokeless tobacco: Never  Vaping Use   Vaping status: Never Used  Substance and Sexual Activity   Alcohol use: Yes    Alcohol/week: 0.0 standard drinks of alcohol    Comment: OCCASIONAL   Drug use: No   Sexual activity: Never  Other Topics Concern   Not on file  Social History Narrative   Regular exercise-no   Social Drivers of Health   Financial Resource Strain: Not on file  Food Insecurity: No Food Insecurity (04/24/2024)   Hunger Vital Sign    Worried About Running Out of Food in the Last Year: Never true    Ran Out of Food in the Last Year: Never true  Transportation Needs: No Transportation Needs (04/24/2024)   PRAPARE - Administrator, Civil Service (Medical): No    Lack of Transportation (Non-Medical): No  Physical Activity: Not on file  Stress: Not on file  Social Connections: Not on file  Intimate Partner Violence: Not At Risk (04/24/2024)   Humiliation, Afraid, Rape, and  Kick questionnaire    Fear of Current or Ex-Partner: No    Emotionally Abused: No    Physically Abused: No    Sexually Abused: No   Current Outpatient Medications on File Prior to Visit  Medication Sig Dispense Refill   amLODipine  (NORVASC ) 5 MG tablet Take 1 tablet (5 mg total) by mouth daily. 90 tablet 3   aspirin  EC 81 MG tablet Take 1 tablet (81 mg total) by mouth daily. Swallow whole.     dapagliflozin  propanediol (FARXIGA ) 10 MG TABS tablet Take 1 tablet (10 mg total) by mouth daily before breakfast. 90 tablet 3   furosemide  (LASIX ) 40 MG tablet Take 1 tablet (40 mg total) by mouth daily. 90 tablet 3   glucose blood (ONETOUCH ULTRA) test strip Use to check blood sugar 1-2 times a day 200 strip 11   insulin  degludec (TRESIBA  FLEXTOUCH) 200 UNIT/ML FlexTouch Pen Inject 90 Units into the skin daily. 45 mL 2   Insulin  Lispro-aabc (LYUMJEV  KWIKPEN) 200 UNIT/ML KwikPen Inject 6-10 Units into the skin 3 (three) times daily before meals. Inject 6-10 units before meals, under skin 18 mL 2   Insulin  Pen Needle 32G X 4 MM MISC Use 4x a day 400 each 3   Lancets (ONETOUCH DELICA PLUS LANCET33G) MISC 1 each by Other route 2 (two) times daily. E11.9 200 each 3   metoprolol  succinate (TOPROL -XL) 50 MG 24 hr tablet Take 1 tablet (50 mg total) by mouth daily. Take with or immediately following a meal. 90 tablet 3   nitroGLYCERIN  (NITROSTAT ) 0.4 MG SL tablet Place 0.4 mg under the tongue every 5 (five) minutes x 3 doses as needed for chest pain.     rosuvastatin  (CRESTOR ) 20 MG tablet Take 1 tablet (20 mg total) by mouth daily. 90 tablet 3   spironolactone  (ALDACTONE ) 25 MG tablet Take 1 tablet (25 mg total) by mouth  daily. 90 tablet 3   trolamine salicylate (ASPERCREME) 10 % cream Apply 1 application  topically 3 (three) times daily as needed for muscle pain.     valsartan  (DIOVAN ) 160 MG tablet Take 1 tablet (160 mg total) by mouth daily. 90 tablet 3   No current facility-administered medications on  file prior to visit.   Allergies  Allergen Reactions   Actos [Pioglitazone] Swelling and Cough    SWELLING REACTION UNSPECIFIED  EDEMA   Family History  Problem Relation Age of Onset   Arthritis Mother    Hypertension Mother    Arthritis Father    Hypertension Father    Diabetes Father    Heart attack Brother 67   Diabetes Brother    Cancer Maternal Uncle        unknown type   Prostate cancer Paternal Uncle 30 - 73       metastatic   PE: BP (!) 142/70   Pulse 80   Ht 5' 11 (1.803 m)   Wt 234 lb (106.1 kg) Comment: Not obtained by us . he said he was weighted this morning before his appt  SpO2 97%   BMI 32.64 kg/m  Wt Readings from Last 10 Encounters:  06/04/24 234 lb (106.1 kg)  04/24/24 230 lb 2 oz (104.4 kg)  03/10/24 239 lb (108.4 kg)  02/19/24 235 lb (106.6 kg)  12/03/23 230 lb 12.8 oz (104.7 kg)  11/27/23 229 lb 3.2 oz (104 kg)  10/02/23 233 lb 9.6 oz (106 kg)  09/27/23 230 lb (104.3 kg)  09/23/23 230 lb 9.6 oz (104.6 kg)  09/12/23 229 lb (103.9 kg)  : Constitutional: overweight, in NAD Eyes: no exophthalmos ENT: no thyromegaly, no cervical lymphadenopathy Cardiovascular: RRR, No RG, + 1/6 SEM, + L LE slight swelling Respiratory: CTA B Musculoskeletal: no deformities Skin: + rashes - stasis dermatitis on B legs Neurological: no tremor with outstretched hands  ASSESSMENT: 1. DM2, insulin -dependent, uncontrolled, with complications - CAD - NICMP - mild CKD  No family history of medullary thyroid  cancer or personal history of pancreatitis.  2. HL  3.  Toe ulcer  PLAN:  1. Patient with longstanding, uncontrolled, type 2 diabetes, on oral antidiabetic regimen with SGLT2 inhibitor and also basal-bolus insulin  regimen, with the ultra rapid acting insulin  doses adjusted at last visit.  At that time, he was still unfortunately only checking blood sugars in the morning so we could not make drastic changes in his regimen.  His sugars were slightly improved,  though.  He was taking Farxiga  at bedtime and I advised him to move this at least before dinner, since he mentioned he could not take it in the morning due to increased urination and urinary incontinence.  We did not change the Tresiba  dose but I did advise him to increase the dose of Lyumjev  and to change the dose based on the size of the meal.  HbA1c at last visit was slightly lower, at 8.1%. - At today's visit, he mentions that he is not taking Lyumjev  before every meal, only 6 units before dinner.  He continues the rest of the regimen as prescribed.  He is still only checking blood sugars in the morning and they are mostly at goal but he does have occasional higher blood sugars.  At today's visit, we again discussed about trying to check his blood sugars later in the day, also, but I cannot change his regimen until we have more data.  He does feel that his sugars  have improved, though and this is confirmed on the HbA1c today (see below). - I suggested to:  Patient Instructions  Please use the following regimen: - Farxiga  10 mg before dinner - Tresiba  90 units daily - Lyumjev  6 units before dinner  Check some sugars later in the day - midday and bedtime.  Please return in 3 months with your sugar log.   - we checked his HbA1c: 6.5% (lower) - advised to check sugars at different times of the day - 3-4x a day, rotating check times - advised for yearly eye exams >> he is UTD - return to clinic in 3 months  2. HL - Latest lipid panel was reviewed from 11/2023: LDL above our target of less than 55, otherwise fractions at goal: Lab Results  Component Value Date   CHOL 143 12/03/2023   HDL 49 12/03/2023   LDLCALC 78 12/03/2023   TRIG 79 12/03/2023   CHOLHDL 2.9 12/03/2023  - Continues Crestor  20 mg daily without side effects  3.  Toe ulcer - Left second toe-dorsal aspect -He mentioned that he hit his toe several times in the same place -He does have good pedal pulses, but he has  significant venous stasis in his feet. -His nails are dystrophic and he is skin is shiny and dystrophic appearing -I recommended a podiatry visit as soon as possible.  Given address and telephone number for [facility].  However, he did not see them.  However, he mentions that this resolved.  Lela Fendt, MD PhD [facility] Endocrinology

## 2024-06-05 ENCOUNTER — Other Ambulatory Visit: Payer: Self-pay

## 2024-06-05 ENCOUNTER — Ambulatory Visit
Admission: RE | Admit: 2024-06-05 | Discharge: 2024-06-05 | Disposition: A | Discharge status: Home / Self Care | Source: Ambulatory Visit | Attending: Radiation Oncology | Admitting: Radiation Oncology

## 2024-06-05 DIAGNOSIS — Z510 Encounter for antineoplastic radiation therapy: Secondary | ICD-10-CM | POA: Diagnosis not present

## 2024-06-05 LAB — RAD ONC ARIA SESSION SUMMARY
Course Elapsed Days: 23
Plan Fractions Treated to Date: 16
Plan Prescribed Dose Per Fraction: 1.8 Gy
Plan Total Fractions Prescribed: 25
Plan Total Prescribed Dose: 45 Gy
Reference Point Dosage Given to Date: 28.8 Gy
Reference Point Session Dosage Given: 1.8 Gy
Session Number: 16

## 2024-06-08 ENCOUNTER — Ambulatory Visit
Admission: RE | Admit: 2024-06-08 | Discharge: 2024-06-08 | Disposition: A | Discharge status: Home / Self Care | Source: Ambulatory Visit | Attending: Radiation Oncology

## 2024-06-08 ENCOUNTER — Other Ambulatory Visit: Payer: Self-pay

## 2024-06-08 DIAGNOSIS — Z510 Encounter for antineoplastic radiation therapy: Secondary | ICD-10-CM | POA: Diagnosis not present

## 2024-06-08 LAB — RAD ONC ARIA SESSION SUMMARY
Course Elapsed Days: 26
Plan Fractions Treated to Date: 17
Plan Prescribed Dose Per Fraction: 1.8 Gy
Plan Total Fractions Prescribed: 25
Plan Total Prescribed Dose: 45 Gy
Reference Point Dosage Given to Date: 30.6 Gy
Reference Point Session Dosage Given: 1.8 Gy
Session Number: 17

## 2024-06-09 ENCOUNTER — Other Ambulatory Visit: Payer: Self-pay

## 2024-06-09 ENCOUNTER — Ambulatory Visit
Admission: RE | Admit: 2024-06-09 | Discharge: 2024-06-09 | Disposition: A | Discharge status: Home / Self Care | Source: Ambulatory Visit | Attending: Radiation Oncology | Admitting: Radiation Oncology

## 2024-06-09 DIAGNOSIS — Z510 Encounter for antineoplastic radiation therapy: Secondary | ICD-10-CM | POA: Diagnosis not present

## 2024-06-09 LAB — RAD ONC ARIA SESSION SUMMARY
Course Elapsed Days: 27
Plan Fractions Treated to Date: 18
Plan Prescribed Dose Per Fraction: 1.8 Gy
Plan Total Fractions Prescribed: 25
Plan Total Prescribed Dose: 45 Gy
Reference Point Dosage Given to Date: 32.4 Gy
Reference Point Session Dosage Given: 1.8 Gy
Session Number: 18

## 2024-06-10 ENCOUNTER — Ambulatory Visit
Admission: RE | Admit: 2024-06-10 | Discharge: 2024-06-10 | Disposition: A | Discharge status: Home / Self Care | Source: Ambulatory Visit | Attending: Radiation Oncology | Admitting: Radiation Oncology

## 2024-06-10 ENCOUNTER — Other Ambulatory Visit: Payer: Self-pay

## 2024-06-10 DIAGNOSIS — Z510 Encounter for antineoplastic radiation therapy: Secondary | ICD-10-CM | POA: Diagnosis not present

## 2024-06-10 LAB — RAD ONC ARIA SESSION SUMMARY
Course Elapsed Days: 28
Plan Fractions Treated to Date: 19
Plan Prescribed Dose Per Fraction: 1.8 Gy
Plan Total Fractions Prescribed: 25
Plan Total Prescribed Dose: 45 Gy
Reference Point Dosage Given to Date: 34.2 Gy
Reference Point Session Dosage Given: 1.8 Gy
Session Number: 19

## 2024-06-11 ENCOUNTER — Other Ambulatory Visit: Payer: Self-pay

## 2024-06-11 ENCOUNTER — Ambulatory Visit
Admission: RE | Admit: 2024-06-11 | Discharge: 2024-06-11 | Disposition: A | Discharge status: Home / Self Care | Source: Ambulatory Visit | Attending: Radiation Oncology

## 2024-06-11 ENCOUNTER — Ambulatory Visit
Admission: RE | Admit: 2024-06-11 | Discharge: 2024-06-11 | Disposition: A | Discharge status: Home / Self Care | Source: Ambulatory Visit | Attending: Radiation Oncology | Admitting: Radiation Oncology

## 2024-06-11 DIAGNOSIS — Z510 Encounter for antineoplastic radiation therapy: Secondary | ICD-10-CM | POA: Diagnosis not present

## 2024-06-11 LAB — RAD ONC ARIA SESSION SUMMARY
Course Elapsed Days: 29
Plan Fractions Treated to Date: 20
Plan Prescribed Dose Per Fraction: 1.8 Gy
Plan Total Fractions Prescribed: 25
Plan Total Prescribed Dose: 45 Gy
Reference Point Dosage Given to Date: 36 Gy
Reference Point Session Dosage Given: 1.8 Gy
Session Number: 20

## 2024-06-12 ENCOUNTER — Ambulatory Visit
Admission: RE | Admit: 2024-06-12 | Discharge: 2024-06-12 | Disposition: A | Discharge status: Home / Self Care | Source: Ambulatory Visit | Attending: Radiation Oncology | Admitting: Radiation Oncology

## 2024-06-12 ENCOUNTER — Other Ambulatory Visit: Payer: Self-pay

## 2024-06-12 DIAGNOSIS — Z510 Encounter for antineoplastic radiation therapy: Secondary | ICD-10-CM | POA: Diagnosis not present

## 2024-06-12 LAB — RAD ONC ARIA SESSION SUMMARY
Course Elapsed Days: 30
Plan Fractions Treated to Date: 21
Plan Prescribed Dose Per Fraction: 1.8 Gy
Plan Total Fractions Prescribed: 25
Plan Total Prescribed Dose: 45 Gy
Reference Point Dosage Given to Date: 37.8 Gy
Reference Point Session Dosage Given: 1.8 Gy
Session Number: 21

## 2024-06-15 ENCOUNTER — Other Ambulatory Visit: Payer: Self-pay

## 2024-06-15 ENCOUNTER — Ambulatory Visit
Admission: RE | Admit: 2024-06-15 | Discharge: 2024-06-15 | Disposition: A | Discharge status: Home / Self Care | Source: Ambulatory Visit | Attending: Radiation Oncology

## 2024-06-15 DIAGNOSIS — Z510 Encounter for antineoplastic radiation therapy: Secondary | ICD-10-CM | POA: Diagnosis not present

## 2024-06-15 LAB — RAD ONC ARIA SESSION SUMMARY
Course Elapsed Days: 33
Plan Fractions Treated to Date: 22
Plan Prescribed Dose Per Fraction: 1.8 Gy
Plan Total Fractions Prescribed: 25
Plan Total Prescribed Dose: 45 Gy
Reference Point Dosage Given to Date: 39.6 Gy
Reference Point Session Dosage Given: 1.8 Gy
Session Number: 22

## 2024-06-16 ENCOUNTER — Ambulatory Visit
Admission: RE | Admit: 2024-06-16 | Discharge: 2024-06-16 | Disposition: A | Discharge status: Home / Self Care | Source: Ambulatory Visit | Attending: Radiation Oncology | Admitting: Radiation Oncology

## 2024-06-16 ENCOUNTER — Other Ambulatory Visit: Payer: Self-pay

## 2024-06-16 DIAGNOSIS — Z510 Encounter for antineoplastic radiation therapy: Secondary | ICD-10-CM | POA: Diagnosis not present

## 2024-06-16 LAB — RAD ONC ARIA SESSION SUMMARY
Course Elapsed Days: 34
Plan Fractions Treated to Date: 23
Plan Prescribed Dose Per Fraction: 1.8 Gy
Plan Total Fractions Prescribed: 25
Plan Total Prescribed Dose: 45 Gy
Reference Point Dosage Given to Date: 41.4 Gy
Reference Point Session Dosage Given: 1.8 Gy
Session Number: 23

## 2024-06-17 ENCOUNTER — Other Ambulatory Visit: Payer: Self-pay

## 2024-06-17 ENCOUNTER — Ambulatory Visit
Admission: RE | Admit: 2024-06-17 | Discharge: 2024-06-17 | Disposition: A | Discharge status: Home / Self Care | Source: Ambulatory Visit | Attending: Radiation Oncology

## 2024-06-17 DIAGNOSIS — Z510 Encounter for antineoplastic radiation therapy: Secondary | ICD-10-CM | POA: Diagnosis not present

## 2024-06-17 LAB — RAD ONC ARIA SESSION SUMMARY
Course Elapsed Days: 35
Plan Fractions Treated to Date: 24
Plan Prescribed Dose Per Fraction: 1.8 Gy
Plan Total Fractions Prescribed: 25
Plan Total Prescribed Dose: 45 Gy
Reference Point Dosage Given to Date: 43.2 Gy
Reference Point Session Dosage Given: 1.8 Gy
Session Number: 24

## 2024-06-18 ENCOUNTER — Other Ambulatory Visit: Payer: Self-pay

## 2024-06-18 ENCOUNTER — Ambulatory Visit
Admission: RE | Admit: 2024-06-18 | Discharge: 2024-06-18 | Disposition: A | Discharge status: Home / Self Care | Source: Ambulatory Visit | Attending: Radiation Oncology | Admitting: Radiation Oncology

## 2024-06-18 ENCOUNTER — Ambulatory Visit: Admitting: Pharmacist

## 2024-06-18 ENCOUNTER — Ambulatory Visit

## 2024-06-18 VITALS — BP 148/72 | HR 73

## 2024-06-18 DIAGNOSIS — Z510 Encounter for antineoplastic radiation therapy: Secondary | ICD-10-CM | POA: Diagnosis not present

## 2024-06-18 DIAGNOSIS — I119 Hypertensive heart disease without heart failure: Secondary | ICD-10-CM | POA: Insufficient documentation

## 2024-06-18 LAB — RAD ONC ARIA SESSION SUMMARY
Course Elapsed Days: 36
Plan Fractions Treated to Date: 25
Plan Prescribed Dose Per Fraction: 1.8 Gy
Plan Total Fractions Prescribed: 25
Plan Total Prescribed Dose: 45 Gy
Reference Point Dosage Given to Date: 45 Gy
Reference Point Session Dosage Given: 1.8 Gy
Session Number: 25

## 2024-06-18 MED ORDER — NITROGLYCERIN 0.4 MG SL SUBL: 0.4000 mg | SUBLINGUAL_TABLET | SUBLINGUAL | 1 refills | Status: AC | PRN

## 2024-06-18 MED ORDER — AMLODIPINE BESYLATE 10 MG PO TABS: 10.0000 mg | ORAL_TABLET | Freq: Every day | ORAL | 3 refills | Status: AC

## 2024-06-18 NOTE — Patient Instructions (Addendum)
 Your blood pressure goal is < 130/52mmHg   Please increase amlodipine  to 10mg  daily Continue valsartan  320mg  daily, furosemide  40mg  as needed, spironolactone  25mg  daily, metoprolol  succinate 50g daily  Please bring in your blood pressure machine to next visit  Intervention  Effect on the BP   Weight loss Weight loss is one of the most effective lifestyle changes for controlling blood pressure. If you're overweight or obese, losing even a small amount of weight can help reduce blood pressure.    Blood pressure can decrease by 1 millimeter of mercury (mmHg) with each kilogram (about 2.2 pounds) of weight lost.   Exercise regularly As a general goal, aim for 30 minutes of moderate physical activity every day.    Regular physical activity can lower blood pressure by 5 - 8 mmHg.   Eat a healthy diet Eat a diet rich in whole grains, fruits, vegetables, lean meat, and low-fat dairy products. Limit processed foods, saturated fat, and sweets.    A heart-healthy diet can lower high blood pressure by 10 mmHg.   Reduce salt (sodium) in your diet Aim for 000mg  of sodium each day. Avoid deli meats, canned food, and frozen microwave meals which are high in sodium.     Limiting sodium can reduce blood pressure by 5 mmHg.   Limit alcohol One drink equals 12 ounces of beer, 5 ounces of wine, or 1.5 ounces of 80-proof liquor.    Limiting alcohol to < 1 drink a day for women or < 2 drinks a day for men can help lower blood pressure by about 4 mmHg.   To check your pressure at home you will need to:   Sit up in a chair, with feet flat on the floor and back supported. Do not cross your ankles or legs. Rest your left arm so that the cuff is about heart level. If the cuff goes on your upper arm, then just relax your arm on the table, arm of the chair, or your lap. If you have a wrist cuff, hold your wrist against your chest at heart level. Place the cuff snugly around your arm, about 1 inch  above the crease of your elbow. The cords should be inside the groove of your elbow.  Sit quietly, with the cuff in place, for about 5 minutes. Then press the power button to start a reading. Do not talk or move while the reading is taking place.  Record your readings on a sheet of paper. Although most cuffs have a memory, it is often easier to see a pattern developing when the numbers are all in front of you.  You can repeat the reading after 1-3 minutes if it is recommended.   Make sure your bladder is empty and you have not had caffeine or tobacco within the last 30 minutes   Always bring your blood pressure log with you to your appointments. If you have not brought your monitor in to be double checked for accuracy, please bring it to your next appointment.   You can find a list of validated (accurate) blood pressure cuffs at: validatebp.org

## 2024-06-18 NOTE — Assessment & Plan Note (Signed)
 Assessment: Blood pressure elevated both in clinic and at home.  Clinic reading better than home readings Home cuff has not been validated.  He reports resting prior to checking blood pressure Does not take his furosemide  or spironolactone  every day due to urinary incontinence and work Increase in fatigue due to radiation  Plan: Increase amlodipine  to 10 mg daily Continue valsartan  to greater than 20 mg daily, furosemide  as needed, spironolactone  25 mg daily and metoprolol  succinate 50 mg daily Follow-up in about 3 to 4 weeks-bring home blood pressure cuff for validation

## 2024-06-18 NOTE — Progress Notes (Signed)
 Patient ID: Matthew Schmitt                 DOB: 01-13-1951                      MRN: [id number]      HPI: Matthew Schmitt is a 73 y.o. male referred by Dr. Jeffrie to HTN clinic. PMH is significant for aortic stenosis, afib, HTN, prostate cancer, HFrecEF (current EF 45-50%).  Patient was seen by Dr. Jeffrie 03/10/24. His blood pressure was 190/100. Patient reports that he had missed 4 days of medications while on a fishing trip. Valsartan  was increased to 320mg  and amlodipine  5mg  was added. Of note, patient prefers to avoid diuretics due to urinary incontinence. However on 5/14 patient called reporting SOB and swelling in the upper torso and legs. Furosemide  40mg  daily was added.  I saw patient 05/08/24. BP in clinic was 140/64. He had run out of valsartan . He was about to begin radiation for prostate cancer. I asked him to call ES and get his Rx expedited.   Patient presents today for follow-up.  Home readings have been elevated in the 160s and 170s systolic.  He reports that he cannot take his furosemide  or spironolactone  every day due to urinary incontinence.  Also now experiencing significant diarrhea due to his radiation.  He does say he is trying to drink plenty fluids.  He denies any dizziness, lightheadedness or headaches.  He does have some shortness of breath and swelling especially when he does not take his furosemide .  Improves when he does take the furosemide .  Current HTN meds: valsartan  320mg  daily, amlodipine  5mg  daily, furosemide  40mg  as needed, spironolactone  25mg  daily, metoprolol  succinate 50mg  daily Previously tried:  BP goal: <130/80  Family History:  Family History  Problem Relation Age of Onset   Arthritis Mother    Hypertension Mother    Arthritis Father    Hypertension Father    Diabetes Father    Heart attack Brother 78   Diabetes Brother    Cancer Maternal Uncle        unknown type   Prostate cancer Paternal Uncle 63 - 52       metastatic     Social History:   Social History   Socioeconomic History   Marital status: Married    Spouse name: Not on file   Number of children: 2   Years of education: 16   Highest education level: Not on file  Occupational History   Occupation: Emergency planning/management officer, Mining engineer  Tobacco Use   Smoking status: Former    Current packs/day: 0.00    Average packs/day: 1 pack/day for 6.0 years (6.0 ttl pk-yrs)    Types: Cigarettes    Start date: 11/01/1963    Quit date: 10/31/1969    Years since quitting: 54.6   Smokeless tobacco: Never  Vaping Use   Vaping status: Never Used  Substance and Sexual Activity   Alcohol use: Yes    Alcohol/week: 0.0 standard drinks of alcohol    Comment: OCCASIONAL   Drug use: No   Sexual activity: Never  Other Topics Concern   Not on file  Social History Narrative   Regular exercise-no   Social Drivers of Health   Financial Resource Strain: Not on file  Food Insecurity: No Food Insecurity (04/24/2024)   Hunger Vital Sign    Worried About Running Out of Food in the Last Year: Never true    Ran  Out of Food in the Last Year: Never true  Transportation Needs: No Transportation Needs (04/24/2024)   PRAPARE - Administrator, Civil Service (Medical): No    Lack of Transportation (Non-Medical): No  Physical Activity: Not on file  Stress: Not on file  Social Connections: Not on file  Intimate Partner Violence: Not At Risk (04/24/2024)   Humiliation, Afraid, Rape, and Kick questionnaire    Fear of Current or Ex-Partner: No    Emotionally Abused: No    Physically Abused: No    Sexually Abused: No    Diet:  Crystal lite tea or peach mango Breakfast sandwich: egg and cheese Lunch: couple packs of crackers Dinner: chicken or tomato sandwich  Exercise: walks 0.5 miles per day (into cancer center), yard work on Sat  Home BP readings:    SBP DBP HR 170 91 76 164 80 63 176 102 65 174 90 71 176 107 86 175 97 79 170 92 76 167 95 63 162  87 76 167 86 68 160 90 77 162 87  77   Wt Readings from Last 3 Encounters:  06/04/24 234 lb (106.1 kg)  04/24/24 230 lb 2 oz (104.4 kg)  03/10/24 239 lb (108.4 kg)   BP Readings from Last 3 Encounters:  06/18/24 (!) 148/72  06/04/24 (!) 142/70  05/08/24 (!) 140/64   Pulse Readings from Last 3 Encounters:  06/18/24 73  06/04/24 80  05/08/24 66    Renal function: CrCl cannot be calculated (Patient's most recent lab result is older than the maximum 21 days allowed.).  Past Medical History:  Diagnosis Date   Anxiety    Arthritis    everywhere   Arthrosis of right acromioclavicular joint 01/29/2020   Atrial flutter [facility])    Onset May 2014 Cardioversion done    BPH associated with nocturia    BPH with obstruction/lower urinary tract symptoms 01/04/2017   CAD (coronary artery disease) 04/02/2013   Cath 2012 moderate mid LAD lesion, otherwise not much disease    Cancer (HCC)    Diabetes (HCC) 01/18/2018   Dilated aortic root (HCC)    aortic root 44 mm, ascending aorta 46 mm 12/22/21 echo   ED (erectile dysfunction)    Elevated PSA    Encounter for long-term (current) use of other medications 07/27/2013   Essential hypertension, benign    First degree heart block    Heart failure with mildly reduced ejection fraction (HFmrEF, 41-49%) (HCC) 08/18/2021   Hx of tachycardia mediated cardiomyopathy  TTE 12/27/2020: EF 20-25 TTE 05/10/2021: EF 30-35 TTE 12/22/2021: EF 50-55 (after CTI ablation) TTE 02/19/2023: EF 45-50, global HK, GR 2 DD, normal RVSF, severe LAE, mild MR, mild-moderate AI, moderate aortic stenosis (V-max 300 cm/s, mean gradient 19 mmHg, DI 0.33), ascending aorta 4.2 cm, RAP 3    Heart murmur    mild-moderate AS and AR 01/11/22   History of bilateral knee replacement 02/07/2017   History of bladder stone    History of kidney stones    History of melanoma  excision    June 2016  --- s/p  excision right  leg (per pt localized and no recurrence)   Hypertensive heart disease 04/02/2013   Impingement syndrome of right shoulder 01/29/2020   Impotence of organic origin    Left-sided low back pain with left-sided sciatica 01/23/2019   Long term current use of anticoagulant therapy    Mixed hyperlipidemia    slightly   Multiple renal cysts    bilateral  Nephrolithiasis 05/17/2014   Nonischemic cardiomyopathy (HCC)    Osteoarthritis    PAF (paroxysmal atrial fibrillation) (HCC)    followed by cardiology  (takes ASA)   Peripheral vascular disease (HCC)    RBBB    RBBB (right bundle branch block with left anterior fascicular block) 04/02/2013   Renal oncocytoma of left kidney 06/18/2013   s/p left robotic assited laparoscopic partial nephrectomy 06/18/13   Right ureteral stone    Rotator cuff syndrome of left shoulder 04/17/2016   Status post total left knee replacement 01/01/2020   Tendinopathy of right biceps tendon 01/29/2020   Tendinopathy of right rotator cuff 01/29/2020   Type 2 diabetes mellitus treated with insulin  [facility])    endocrinologist-  dr kassie   Wears glasses     Current Outpatient Medications on File Prior to Visit  Medication Sig Dispense Refill   aspirin  EC 81 MG tablet Take 1 tablet (81 mg total) by mouth daily. Swallow whole.     dapagliflozin  propanediol (FARXIGA ) 10 MG TABS tablet Take 1 tablet (10 mg total) by mouth daily before breakfast. 90 tablet 3   furosemide  (LASIX ) 40 MG tablet Take 1 tablet (40 mg total) by mouth daily. 90 tablet 3   glucose blood (ONETOUCH ULTRA) test strip Use to check blood sugar 1-2 times a day 200 strip 11   insulin  degludec (TRESIBA  FLEXTOUCH) 200 UNIT/ML FlexTouch Pen Inject 90 Units into the skin daily. 45 mL 2   Insulin  Lispro-aabc (LYUMJEV  KWIKPEN) 200 UNIT/ML KwikPen Inject 6-10 Units into the skin 3 (three) times daily before meals. Inject 6-10 units before meals, under skin 18 mL  2   Insulin  Pen Needle 32G X 4 MM MISC Use 4x a day 400 each 3   Lancets (ONETOUCH DELICA PLUS LANCET33G) MISC 1 each by Other route 2 (two) times daily. E11.9 200 each 3   metoprolol  succinate (TOPROL -XL) 50 MG 24 hr tablet Take 1 tablet (50 mg total) by mouth daily. Take with or immediately following a meal. 90 tablet 3   rosuvastatin  (CRESTOR ) 20 MG tablet Take 1 tablet (20 mg total) by mouth daily. 90 tablet 3   spironolactone  (ALDACTONE ) 25 MG tablet Take 1 tablet (25 mg total) by mouth daily. 90 tablet 3   trolamine salicylate (ASPERCREME) 10 % cream Apply 1 application  topically 3 (three) times daily as needed for muscle pain.     valsartan  (DIOVAN ) 160 MG tablet Take 1 tablet (160 mg total) by mouth daily. 90 tablet 3   No current facility-administered medications on file prior to visit.    Allergies  Allergen Reactions   Actos [Pioglitazone] Swelling and Cough    SWELLING REACTION UNSPECIFIED  EDEMA    Blood pressure (!) 148/72, pulse 73.   Assessment/Plan: HYPERTENSION CONTROL Vitals:   06/18/24 0910 06/18/24 0912  BP: (!) 158/82 (!) 148/72    The patient's blood pressure is elevated above target today.  In order to address the patient's elevated BP:       1. Hypertension -  Hypertensive heart disease Assessment: Blood pressure elevated both in clinic and at home.  Clinic reading better than home readings Home cuff has not been validated.  He reports resting prior to checking blood pressure Does not take his furosemide  or spironolactone  every day due to urinary incontinence and work Increase in fatigue due to radiation  Plan: Increase amlodipine  to 10 mg daily Continue valsartan  to greater than 20 mg daily, furosemide  as needed, spironolactone  25 mg daily  and metoprolol  succinate 50 mg daily Follow-up in about 3 to 4 weeks-bring home blood pressure cuff for validation    Thank you  Margretta Zamorano D Qusai Kem, Pharm.JONETTA SARAN, CPP [facility] A Division of  [facility] [address] [place], [place] [address]  Phone: [phone]; Fax: [phone]

## 2024-06-19 ENCOUNTER — Other Ambulatory Visit: Payer: Self-pay

## 2024-06-19 ENCOUNTER — Ambulatory Visit

## 2024-06-19 DIAGNOSIS — C61 Malignant neoplasm of prostate: Secondary | ICD-10-CM | POA: Insufficient documentation

## 2024-06-19 LAB — RAD ONC ARIA SESSION SUMMARY
Course Elapsed Days: 37
Plan Fractions Treated to Date: 1
Plan Prescribed Dose Per Fraction: 1.8 Gy
Plan Total Fractions Prescribed: 13
Plan Total Prescribed Dose: 23.4 Gy
Reference Point Dosage Given to Date: 1.8 Gy
Reference Point Session Dosage Given: 1.8 Gy
Session Number: 26

## 2024-06-22 ENCOUNTER — Ambulatory Visit
Admission: RE | Admit: 2024-06-22 | Discharge: 2024-06-22 | Disposition: A | Discharge status: Home / Self Care | Source: Ambulatory Visit | Attending: Radiation Oncology | Admitting: Radiation Oncology

## 2024-06-22 ENCOUNTER — Other Ambulatory Visit: Payer: Self-pay

## 2024-06-22 DIAGNOSIS — C61 Malignant neoplasm of prostate: Secondary | ICD-10-CM | POA: Diagnosis not present

## 2024-06-22 LAB — RAD ONC ARIA SESSION SUMMARY
Course Elapsed Days: 40
Plan Fractions Treated to Date: 2
Plan Prescribed Dose Per Fraction: 1.8 Gy
Plan Total Fractions Prescribed: 13
Plan Total Prescribed Dose: 23.4 Gy
Reference Point Dosage Given to Date: 3.6 Gy
Reference Point Session Dosage Given: 1.8 Gy
Session Number: 27

## 2024-06-23 ENCOUNTER — Other Ambulatory Visit: Payer: Self-pay

## 2024-06-23 ENCOUNTER — Ambulatory Visit
Admission: RE | Admit: 2024-06-23 | Discharge: 2024-06-23 | Disposition: A | Discharge status: Home / Self Care | Source: Ambulatory Visit | Attending: Radiation Oncology | Admitting: Radiation Oncology

## 2024-06-23 DIAGNOSIS — C61 Malignant neoplasm of prostate: Secondary | ICD-10-CM | POA: Diagnosis not present

## 2024-06-23 LAB — RAD ONC ARIA SESSION SUMMARY
Course Elapsed Days: 41
Plan Fractions Treated to Date: 3
Plan Prescribed Dose Per Fraction: 1.8 Gy
Plan Total Fractions Prescribed: 13
Plan Total Prescribed Dose: 23.4 Gy
Reference Point Dosage Given to Date: 5.4 Gy
Reference Point Session Dosage Given: 1.8 Gy
Session Number: 28

## 2024-06-24 ENCOUNTER — Other Ambulatory Visit: Payer: Self-pay

## 2024-06-24 ENCOUNTER — Ambulatory Visit
Admission: RE | Admit: 2024-06-24 | Discharge: 2024-06-24 | Disposition: A | Discharge status: Home / Self Care | Source: Ambulatory Visit | Attending: Radiation Oncology | Admitting: Radiation Oncology

## 2024-06-24 DIAGNOSIS — C61 Malignant neoplasm of prostate: Secondary | ICD-10-CM | POA: Diagnosis not present

## 2024-06-24 LAB — RAD ONC ARIA SESSION SUMMARY
Course Elapsed Days: 42
Plan Fractions Treated to Date: 4
Plan Prescribed Dose Per Fraction: 1.8 Gy
Plan Total Fractions Prescribed: 13
Plan Total Prescribed Dose: 23.4 Gy
Reference Point Dosage Given to Date: 7.2 Gy
Reference Point Session Dosage Given: 1.8 Gy
Session Number: 29

## 2024-06-25 ENCOUNTER — Ambulatory Visit
Admission: RE | Admit: 2024-06-25 | Discharge: 2024-06-25 | Disposition: A | Discharge status: Home / Self Care | Source: Ambulatory Visit | Attending: Radiation Oncology | Admitting: Radiation Oncology

## 2024-06-25 ENCOUNTER — Other Ambulatory Visit: Payer: Self-pay

## 2024-06-25 DIAGNOSIS — C61 Malignant neoplasm of prostate: Secondary | ICD-10-CM | POA: Diagnosis not present

## 2024-06-25 LAB — RAD ONC ARIA SESSION SUMMARY
Course Elapsed Days: 43
Plan Fractions Treated to Date: 5
Plan Prescribed Dose Per Fraction: 1.8 Gy
Plan Total Fractions Prescribed: 13
Plan Total Prescribed Dose: 23.4 Gy
Reference Point Dosage Given to Date: 9 Gy
Reference Point Session Dosage Given: 1.8 Gy
Session Number: 30

## 2024-06-26 ENCOUNTER — Ambulatory Visit
Admission: RE | Admit: 2024-06-26 | Discharge: 2024-06-26 | Disposition: A | Discharge status: Home / Self Care | Source: Ambulatory Visit | Attending: Radiation Oncology | Admitting: Radiation Oncology

## 2024-06-26 ENCOUNTER — Other Ambulatory Visit: Payer: Self-pay

## 2024-06-26 DIAGNOSIS — C61 Malignant neoplasm of prostate: Secondary | ICD-10-CM | POA: Diagnosis not present

## 2024-06-26 LAB — RAD ONC ARIA SESSION SUMMARY
Course Elapsed Days: 44
Plan Fractions Treated to Date: 6
Plan Prescribed Dose Per Fraction: 1.8 Gy
Plan Total Fractions Prescribed: 13
Plan Total Prescribed Dose: 23.4 Gy
Reference Point Dosage Given to Date: 10.8 Gy
Reference Point Session Dosage Given: 1.8 Gy
Session Number: 31

## 2024-06-29 ENCOUNTER — Other Ambulatory Visit: Payer: Self-pay

## 2024-06-29 ENCOUNTER — Ambulatory Visit
Admission: RE | Admit: 2024-06-29 | Discharge: 2024-06-29 | Disposition: A | Discharge status: Home / Self Care | Source: Ambulatory Visit | Attending: Radiation Oncology

## 2024-06-29 DIAGNOSIS — C61 Malignant neoplasm of prostate: Secondary | ICD-10-CM | POA: Diagnosis not present

## 2024-06-29 LAB — RAD ONC ARIA SESSION SUMMARY
Course Elapsed Days: 47
Plan Fractions Treated to Date: 7
Plan Prescribed Dose Per Fraction: 1.8 Gy
Plan Total Fractions Prescribed: 13
Plan Total Prescribed Dose: 23.4 Gy
Reference Point Dosage Given to Date: 12.6 Gy
Reference Point Session Dosage Given: 1.8 Gy
Session Number: 32

## 2024-06-30 ENCOUNTER — Other Ambulatory Visit: Payer: Self-pay

## 2024-06-30 ENCOUNTER — Ambulatory Visit
Admission: RE | Admit: 2024-06-30 | Discharge: 2024-06-30 | Disposition: A | Discharge status: Home / Self Care | Source: Ambulatory Visit | Attending: Radiation Oncology

## 2024-06-30 DIAGNOSIS — C61 Malignant neoplasm of prostate: Secondary | ICD-10-CM | POA: Diagnosis not present

## 2024-06-30 LAB — RAD ONC ARIA SESSION SUMMARY
Course Elapsed Days: 48
Plan Fractions Treated to Date: 8
Plan Prescribed Dose Per Fraction: 1.8 Gy
Plan Total Fractions Prescribed: 13
Plan Total Prescribed Dose: 23.4 Gy
Reference Point Dosage Given to Date: 14.4 Gy
Reference Point Session Dosage Given: 1.8 Gy
Session Number: 33

## 2024-07-01 ENCOUNTER — Other Ambulatory Visit: Payer: Self-pay

## 2024-07-01 ENCOUNTER — Ambulatory Visit
Admission: RE | Admit: 2024-07-01 | Discharge: 2024-07-01 | Disposition: A | Discharge status: Home / Self Care | Source: Ambulatory Visit | Attending: Radiation Oncology | Admitting: Radiation Oncology

## 2024-07-01 DIAGNOSIS — C61 Malignant neoplasm of prostate: Secondary | ICD-10-CM | POA: Diagnosis not present

## 2024-07-01 LAB — RAD ONC ARIA SESSION SUMMARY
Course Elapsed Days: 49
Plan Fractions Treated to Date: 9
Plan Prescribed Dose Per Fraction: 1.8 Gy
Plan Total Fractions Prescribed: 13
Plan Total Prescribed Dose: 23.4 Gy
Reference Point Dosage Given to Date: 16.2 Gy
Reference Point Session Dosage Given: 1.8 Gy
Session Number: 34

## 2024-07-02 ENCOUNTER — Ambulatory Visit
Admission: RE | Admit: 2024-07-02 | Discharge: 2024-07-02 | Disposition: A | Discharge status: Home / Self Care | Source: Ambulatory Visit | Attending: Radiation Oncology | Admitting: Radiation Oncology

## 2024-07-02 ENCOUNTER — Other Ambulatory Visit: Payer: Self-pay

## 2024-07-02 DIAGNOSIS — C61 Malignant neoplasm of prostate: Secondary | ICD-10-CM | POA: Diagnosis not present

## 2024-07-02 LAB — RAD ONC ARIA SESSION SUMMARY
Course Elapsed Days: 50
Plan Fractions Treated to Date: 10
Plan Prescribed Dose Per Fraction: 1.8 Gy
Plan Total Fractions Prescribed: 13
Plan Total Prescribed Dose: 23.4 Gy
Reference Point Dosage Given to Date: 18 Gy
Reference Point Session Dosage Given: 1.8 Gy
Session Number: 35

## 2024-07-03 ENCOUNTER — Ambulatory Visit
Admission: RE | Admit: 2024-07-03 | Discharge: 2024-07-03 | Disposition: A | Discharge status: Home / Self Care | Source: Ambulatory Visit | Attending: Radiation Oncology | Admitting: Radiation Oncology

## 2024-07-03 ENCOUNTER — Other Ambulatory Visit: Payer: Self-pay

## 2024-07-03 DIAGNOSIS — C61 Malignant neoplasm of prostate: Secondary | ICD-10-CM | POA: Diagnosis not present

## 2024-07-03 LAB — RAD ONC ARIA SESSION SUMMARY
Course Elapsed Days: 51
Plan Fractions Treated to Date: 11
Plan Prescribed Dose Per Fraction: 1.8 Gy
Plan Total Fractions Prescribed: 13
Plan Total Prescribed Dose: 23.4 Gy
Reference Point Dosage Given to Date: 19.8 Gy
Reference Point Session Dosage Given: 1.8 Gy
Session Number: 36

## 2024-07-06 ENCOUNTER — Other Ambulatory Visit: Payer: Self-pay

## 2024-07-06 ENCOUNTER — Ambulatory Visit
Admission: RE | Admit: 2024-07-06 | Discharge: 2024-07-06 | Disposition: A | Discharge status: Home / Self Care | Source: Ambulatory Visit | Attending: Radiation Oncology

## 2024-07-06 ENCOUNTER — Ambulatory Visit

## 2024-07-06 DIAGNOSIS — C61 Malignant neoplasm of prostate: Secondary | ICD-10-CM | POA: Diagnosis not present

## 2024-07-06 LAB — RAD ONC ARIA SESSION SUMMARY
Course Elapsed Days: 54
Plan Fractions Treated to Date: 12
Plan Prescribed Dose Per Fraction: 1.8 Gy
Plan Total Fractions Prescribed: 13
Plan Total Prescribed Dose: 23.4 Gy
Reference Point Dosage Given to Date: 21.6 Gy
Reference Point Session Dosage Given: 1.8 Gy
Session Number: 37

## 2024-07-07 ENCOUNTER — Other Ambulatory Visit: Payer: Self-pay

## 2024-07-07 ENCOUNTER — Ambulatory Visit
Admission: RE | Admit: 2024-07-07 | Discharge: 2024-07-07 | Disposition: A | Discharge status: Home / Self Care | Source: Ambulatory Visit | Attending: Radiation Oncology | Admitting: Radiation Oncology

## 2024-07-07 DIAGNOSIS — C61 Malignant neoplasm of prostate: Secondary | ICD-10-CM

## 2024-07-07 LAB — RAD ONC ARIA SESSION SUMMARY
Course Elapsed Days: 55
Plan Fractions Treated to Date: 13
Plan Prescribed Dose Per Fraction: 1.8 Gy
Plan Total Fractions Prescribed: 13
Plan Total Prescribed Dose: 23.4 Gy
Reference Point Dosage Given to Date: 23.4 Gy
Reference Point Session Dosage Given: 1.8 Gy
Session Number: 38

## 2024-07-08 NOTE — Radiation Completion Notes (Addendum)
  Radiation Oncology         [phone] ________________________________  Name: Matthew Schmitt MRN: [id number]  Date: 07/07/2024  DOB: 12/24/1950  Referring Physician: NORETTA FERRARA, M.D. Date of Service: 2024-07-08 Radiation Oncologist: Adina Barge, M.D. [facility]     RADIATION ONCOLOGY END OF TREATMENT NOTE     Diagnosis:  73 y.o. gentleman with a solitary osseous metastasis in the right iliac bone and a rising PSA of 0.68 s/p RALP 04/2023 for Stage [id number], Gleason 4+5 prostate cancer   Intent: Curative     ==========DELIVERED PLANS==========  First Treatment Date: 2024-05-13 Last Treatment Date: 2024-07-07   Plan Name: ProstBed_Pel Site: Prostate Bed Technique: IMRT Mode: Photon Dose Per Fraction: 1.8 Gy Prescribed Dose (Delivered / Prescribed): 45 Gy / 45 Gy Prescribed Fxs (Delivered / Prescribed): 25 / 25   Plan Name: ProstBed_right iliac bone met_Bst Site: Prostate Bed Technique: IMRT Mode: Photon Dose Per Fraction: 1.8 Gy_2.5 Gy Prescribed Dose (Delivered / Prescribed): 23.4 Gy / 23.4 Gy_32.5 Gy / 32.5 Gy Prescribed Fxs (Delivered / Prescribed): 13 / 13     ==========ON TREATMENT VISIT DATES========== 2024-05-18, 2024-05-21, 2024-06-04, 2024-06-11, 2024-06-18, 2024-06-25, 2024-07-03, 2024-07-06    See weekly On Treatment Notes in Epic for details in the Media tab (listed as Progress notes on the On Treatment Visit Dates listed above).  He tolerated the daily radiation treatments relatively well with some increased LUTS and modest fatigue.  The patient will receive a call in about one month from the radiation oncology department. He will continue follow up with his urologist, Dr. FERRARA, as well.  ------------------------------------------------   Donnice Barge, MD [facility]  Radiation Oncology Direct Dial: [phone]  Fax: [phone] [email or web address]  Skype  LinkedIn

## 2024-07-13 ENCOUNTER — Ambulatory Visit: Attending: Cardiology | Admitting: Pharmacist

## 2024-07-13 VITALS — BP 148/76 | HR 79

## 2024-07-13 DIAGNOSIS — I119 Hypertensive heart disease without heart failure: Secondary | ICD-10-CM | POA: Diagnosis present

## 2024-07-13 LAB — BASIC METABOLIC PANEL WITH GFR
BUN/Creatinine Ratio: 28 — ABNORMAL HIGH (ref 10–24)
BUN: 26 mg/dL (ref 8–27)
CO2: 23 mmol/L (ref 20–29)
Calcium: 9.3 mg/dL (ref 8.6–10.2)
Chloride: 105 mmol/L (ref 96–106)
Creatinine, Ser: 0.94 mg/dL (ref 0.76–1.27)
Glucose: 138 mg/dL — ABNORMAL HIGH (ref 70–99)
Potassium: 4.3 mmol/L (ref 3.5–5.2)
Sodium: 144 mmol/L (ref 134–144)
eGFR: 86 mL/min/1.73 (ref >59)

## 2024-07-13 MED ORDER — FUROSEMIDE 40 MG PO TABS: 40.0000 mg | ORAL_TABLET | Freq: Every day | ORAL | 3 refills | Status: AC

## 2024-07-13 NOTE — Assessment & Plan Note (Signed)
 Assessment: Blood pressure elevated in clinic today No home readings to review but sounds like blood pressure also elevated at home.  Higher in the morning and improves throughout the day Per patient blood pressures never been less than 130/80 and he feels as though this is less of goal.  Patient feels as though 140s is acceptable.  I did explain that even trials looking at intensive versus not intensive blood pressure control used a goal of less than 140 in the nonintensive arm Was off spironolactone  and furosemide  completely for a little while.  Sometimes doses are delayed as appointments, but would usually take later in the day Home blood pressure cuff has not been validated With patient's recent bouts with diarrhea due to his radiation will check BMP before increasing valsartan  dose  Plan: Check BMP today If okay will plan to increase valsartan  to 320 mg daily Continue amlodipine  10mg  daily, furosemide  40mg  as needed, spironolactone  25mg  daily, metoprolol  succinate 50mg  daily Follow-up in 5 weeks in clinic

## 2024-07-13 NOTE — Progress Notes (Signed)
 Patient ID: Matthew Schmitt                 DOB: March 05, 1951                      MRN: [id number]      HPI: Matthew Schmitt is a 73 y.o. male referred by Dr. Jeffrie to HTN clinic. PMH is significant for aortic stenosis, afib, HTN, prostate cancer, HFrecEF (current EF 45-50%).  Patient was seen by Dr. Jeffrie 03/10/24. His blood pressure was 190/100. Patient reports that he had missed 4 days of medications while on a fishing trip. Valsartan  was increased to 320mg  and amlodipine  5mg  was added. Of note, patient prefers to avoid diuretics due to urinary incontinence. However on 5/14 patient called reporting SOB and swelling in the upper torso and legs. Furosemide  40mg  daily was added.  I saw patient 05/08/24. BP in clinic was 140/64. He had run out of valsartan . This was resumed at 160mg  daily. He was about to begin radiation for prostate cancer. I asked him to call ES and get his Rx expedited.   I saw patient last in clinic on 06/18/24. His BP in clinic was 148/72. He reported that home readings had been elevated in the 160s and 170s systolic.  He cannot take his furosemide  or spironolactone  every day due to urinary incontinence.  Also now experiencing significant diarrhea due to his radiation. Amlodipine  was increased to 10mg  daily.  Patient presents today for follow-up.  He reports that he has not been checking blood pressure much at home.  Reports that in the morning it is 160s to 170s systolic and improves to 140s 849d later in the day.  There was a timeframe where he stopped taking both furosemide  and spironolactone  due to urinary incontinence and convenience with his radiation.  He admits to worsening shortness of breath and some chest tightness and swelling while off diuretics.  He resumed taking last Wednesday, swelling shortness of breath is better.  Does report some swelling in his thighs.  Will sometimes wrap which helps with the swelling in his lower legs.  Reports a couple episodes of dizziness  after working outside.  Still having diarrhea.  This is  Current HTN meds: valsartan  160mg  daily, amlodipine  10mg  daily, furosemide  40mg  as needed, spironolactone  25mg  daily, metoprolol  succinate 50mg  daily Previously tried:  BP goal: <130/80  Family History:  Family History  Problem Relation Age of Onset   Arthritis Mother    Hypertension Mother    Arthritis Father    Hypertension Father    Diabetes Father    Heart attack Brother 59   Diabetes Brother    Cancer Maternal Uncle        unknown type   Prostate cancer Paternal Uncle 63 - 69       metastatic     Social History:  Social History   Socioeconomic History   Marital status: Married    Spouse name: Not on file   Number of children: 2   Years of education: 16   Highest education level: Not on file  Occupational History   Occupation: Emergency planning/management officer, Mining engineer  Tobacco Use   Smoking status: Former    Current packs/day: 0.00    Average packs/day: 1 pack/day for 6.0 years (6.0 ttl pk-yrs)    Types: Cigarettes    Start date: 11/01/1963    Quit date: 10/31/1969    Years since quitting: 54.7   Smokeless tobacco: Never  Vaping Use   Vaping status: Never Used  Substance and Sexual Activity   Alcohol use: Yes    Alcohol/week: 0.0 standard drinks of alcohol    Comment: OCCASIONAL   Drug use: No   Sexual activity: Never  Other Topics Concern   Not on file  Social History Narrative   Regular exercise-no   Social Drivers of Health   Financial Resource Strain: Not on file  Food Insecurity: No Food Insecurity (04/24/2024)   Hunger Vital Sign    Worried About Running Out of Food in the Last Year: Never true    Ran Out of Food in the Last Year: Never true  Transportation Needs: No Transportation Needs (04/24/2024)   PRAPARE - Administrator, Civil Service (Medical): No    Lack of Transportation (Non-Medical): No  Physical Activity: Not on file  Stress: Not on file  Social Connections: Not on file   Intimate Partner Violence: Not At Risk (04/24/2024)   Humiliation, Afraid, Rape, and Kick questionnaire    Fear of Current or Ex-Partner: No    Emotionally Abused: No    Physically Abused: No    Sexually Abused: No    Diet:  Crystal lite tea or peach mango Breakfast sandwich: egg and cheese Lunch: couple packs of crackers Dinner: chicken or tomato sandwich  Exercise: walks 0.5 miles per day (into cancer center), yard work on Sat  Home BP readings:   SBP DBP HR 170 91 76 164 80 63 176 102 65 174 90 71 176 107 86 175 97 79 170 92 76 167 95 63 162  87 76 167 86 68 160 90 77 162 87  77   Wt Readings from Last 3 Encounters:  06/04/24 234 lb (106.1 kg)  04/24/24 230 lb 2 oz (104.4 kg)  03/10/24 239 lb (108.4 kg)   BP Readings from Last 3 Encounters:  07/13/24 (!) 148/76  06/18/24 (!) 148/72  06/04/24 (!) 142/70   Pulse Readings from Last 3 Encounters:  07/13/24 79  06/18/24 73  06/04/24 80    Renal function: CrCl cannot be calculated (Patient's most recent lab result is older than the maximum 21 days allowed.).  Past Medical History:  Diagnosis Date   Anxiety    Arthritis    everywhere   Arthrosis of right acromioclavicular joint 01/29/2020   Atrial flutter [facility])    Onset May 2014 Cardioversion done    BPH associated with nocturia    BPH with obstruction/lower urinary tract symptoms 01/04/2017   CAD (coronary artery disease) 04/02/2013   Cath 2012 moderate mid LAD lesion, otherwise not much disease    Cancer (HCC)    Diabetes (HCC) 01/18/2018   Dilated aortic root (HCC)    aortic root 44 mm, ascending aorta 46 mm 12/22/21 echo   ED (erectile dysfunction)    Elevated PSA    Encounter for long-term (current) use of other medications 07/27/2013   Essential hypertension, benign    First degree heart block    Heart failure with mildly reduced ejection fraction (HFmrEF, 41-49%) (HCC) 08/18/2021   Hx of tachycardia mediated cardiomyopathy  TTE 12/27/2020: EF  20-25 TTE 05/10/2021: EF 30-35 TTE 12/22/2021: EF 50-55 (after CTI ablation) TTE 02/19/2023: EF 45-50, global HK, GR 2 DD, normal RVSF, severe LAE, mild MR, mild-moderate AI, moderate aortic stenosis (V-max 300 cm/s, mean gradient 19 mmHg, DI 0.33), ascending aorta 4.2 cm, RAP 3    Heart murmur    mild-moderate AS and AR 01/11/22  History of bilateral knee replacement 02/07/2017   History of bladder stone    History of kidney stones    History of melanoma excision    June 2016  --- s/p  excision right  leg (per pt localized and no recurrence)   Hypertensive heart disease 04/02/2013   Impingement syndrome of right shoulder 01/29/2020   Impotence of organic origin    Left-sided low back pain with left-sided sciatica 01/23/2019   Long term current use of anticoagulant therapy    Mixed hyperlipidemia    slightly   Multiple renal cysts    bilateral   Nephrolithiasis 05/17/2014   Nonischemic cardiomyopathy (HCC)    Osteoarthritis    PAF (paroxysmal atrial fibrillation) (HCC)    followed by cardiology  (takes ASA)   Peripheral vascular disease (HCC)    RBBB    RBBB (right bundle branch block with left anterior fascicular block) 04/02/2013   Renal oncocytoma of left kidney 06/18/2013   s/p left robotic assited laparoscopic partial nephrectomy 06/18/13   Right ureteral stone    Rotator cuff syndrome of left shoulder 04/17/2016   Status post total left knee replacement 01/01/2020   Tendinopathy of right biceps tendon 01/29/2020   Tendinopathy of right rotator cuff 01/29/2020   Type 2 diabetes mellitus treated with insulin  [facility])    endocrinologist-  dr kassie   Wears glasses     Current Outpatient Medications on File Prior to Visit  Medication Sig Dispense Refill   amLODipine  (NORVASC ) 10 MG tablet Take 1 tablet (10 mg total) by mouth daily. 90 tablet 3   aspirin  EC 81 MG tablet Take 1 tablet (81 mg total) by mouth daily. Swallow whole.     dapagliflozin  propanediol (FARXIGA ) 10 MG TABS  tablet Take 1 tablet (10 mg total) by mouth daily before breakfast. 90 tablet 3   insulin  degludec (TRESIBA  FLEXTOUCH) 200 UNIT/ML FlexTouch Pen Inject 90 Units into the skin daily. 45 mL 2   Insulin  Lispro-aabc (LYUMJEV  KWIKPEN) 200 UNIT/ML KwikPen Inject 6-10 Units into the skin 3 (three) times daily before meals. Inject 6-10 units before meals, under skin (Patient taking differently: Inject 6-10 Units into the skin daily with supper. Inject 6-10 units before meals, under skin) 18 mL 2   metoprolol  succinate (TOPROL -XL) 50 MG 24 hr tablet Take 1 tablet (50 mg total) by mouth daily. Take with or immediately following a meal. 90 tablet 3   rosuvastatin  (CRESTOR ) 20 MG tablet Take 1 tablet (20 mg total) by mouth daily. 90 tablet 3   spironolactone  (ALDACTONE ) 25 MG tablet Take 1 tablet (25 mg total) by mouth daily. 90 tablet 3   valsartan  (DIOVAN ) 160 MG tablet Take 1 tablet (160 mg total) by mouth daily. 90 tablet 3   glucose blood (ONETOUCH ULTRA) test strip Use to check blood sugar 1-2 times a day 200 strip 11   Insulin  Pen Needle 32G X 4 MM MISC Use 4x a day 400 each 3   Lancets (ONETOUCH DELICA PLUS LANCET33G) MISC 1 each by Other route 2 (two) times daily. E11.9 200 each 3   nitroGLYCERIN  (NITROSTAT ) 0.4 MG SL tablet Place 1 tablet (0.4 mg total) under the tongue every 5 (five) minutes x 3 doses as needed for chest pain. 25 tablet 1   trolamine salicylate (ASPERCREME) 10 % cream Apply 1 application  topically 3 (three) times daily as needed for muscle pain.     No current facility-administered medications on file prior to visit.  Allergies  Allergen Reactions   Actos [Pioglitazone] Swelling and Cough    SWELLING REACTION UNSPECIFIED  EDEMA    Blood pressure (!) 148/76, pulse 79, SpO2 98%.   Assessment/Plan: HYPERTENSION CONTROL Vitals:   07/13/24 0821 07/13/24 0822  BP: (!) 150/78 (!) 148/76    The patient's blood pressure is elevated above target today.  In order to address  the patient's elevated BP:       1. Hypertension -  Hypertensive heart disease Assessment: Blood pressure elevated in clinic today No home readings to review but sounds like blood pressure also elevated at home.  Higher in the morning and improves throughout the day Per patient blood pressures never been less than 130/80 and he feels as though this is less of goal.  Patient feels as though 140s is acceptable.  I did explain that even trials looking at intensive versus not intensive blood pressure control used a goal of less than 140 in the nonintensive arm Was off spironolactone  and furosemide  completely for a little while.  Sometimes doses are delayed as appointments, but would usually take later in the day Home blood pressure cuff has not been validated With patient's recent bouts with diarrhea due to his radiation will check BMP before increasing valsartan  dose  Plan: Check BMP today If okay will plan to increase valsartan  to 320 mg daily Continue amlodipine  10mg  daily, furosemide  40mg  as needed, spironolactone  25mg  daily, metoprolol  succinate 50mg  daily Follow-up in 5 weeks in clinic     Thank you  Leatta Alewine D Jalien Weakland, Pharm.JONETTA SARAN, CPP [facility] A Division of [facility] [address] [place], [place] [address]  Phone: [phone]; Fax: [phone]

## 2024-07-13 NOTE — Patient Instructions (Addendum)
 Please go to the lab downstairs to have a BMP drawn today  I will call you tomorrow to discuss results. If everything looks good, we will plan to increase Valsartan  to 320mg  daily  For now continue valsartan  160mg  daily, amlodipine  10mg  daily, furosemide  40mg  as needed, spironolactone  25mg  daily, metoprolol  succinate 50mg  daily  Continue checking blood pressure at home. Please bring in readings to next visit along with your blood pressure cuff

## 2024-07-14 ENCOUNTER — Other Ambulatory Visit (INDEPENDENT_AMBULATORY_CARE_PROVIDER_SITE_OTHER): Payer: Self-pay

## 2024-07-14 ENCOUNTER — Ambulatory Visit: Payer: Self-pay | Admitting: Pharmacist

## 2024-07-14 ENCOUNTER — Ambulatory Visit (INDEPENDENT_AMBULATORY_CARE_PROVIDER_SITE_OTHER): Payer: BC Managed Care – PPO | Admitting: Orthopaedic Surgery

## 2024-07-14 DIAGNOSIS — Z96652 Presence of left artificial knee joint: Secondary | ICD-10-CM

## 2024-07-14 MED ORDER — VALSARTAN 320 MG PO TABS: 320.0000 mg | ORAL_TABLET | Freq: Every day | ORAL | 3 refills | Status: AC

## 2024-07-14 NOTE — Progress Notes (Signed)
 Patient: Matthew Schmitt           Date of Birth: 1951-01-03           MRN: [id number] Visit Date: 07/14/2024 PCP: Practice, [facility]   Assessment & Plan:  Chief Complaint:  Chief Complaint  Patient presents with   Left Knee - Follow-up   Visit Diagnoses:  1. Status post revision of total replacement of left knee   2. S/P TKR (total knee replacement), left     Plan: History of Present Illness LUCILLE CRICHLOW is a 73 year old male who is here for 2-year postop check status post revision of left total knee arthroplasty.    His left knee has been functioning well.  He reports no new problems.  He is still recovering from prostate cancer and radiation.  Exam of the left knee shows fully healed surgical scar.  Functional baseline range of motion.  Collaterals are stable.  Assessment and Plan Status post left total knee revision arthroplasty Two years post revision with good function, no significant symptoms. X-rays show slight settling into varus, no gross loosening.  - Recheck in another year with repeat x-rays.  Follow-Up Instructions: Return in about 1 year (around 07/14/2025).   Orders:  Orders Placed This Encounter  Procedures   XR Knee 1-2 Views Left   No orders of the defined types were placed in this encounter.   Imaging: XR Knee 1-2 Views Left Result Date: 07/14/2024 X-rays of the left knee show total knee arthroplasty with a revision tibial stem.  The tibial component is in a slight amount of varus.     PMFS History: Patient Active Problem List   Diagnosis Date Noted   Prostate cancer (HCC) 04/22/2023   Genetic testing 03/22/2023   Malignant neoplasm of prostate (HCC) 03/11/2023   Status post revision of total replacement of left knee 07/13/2022   S/P revision of total knee, left 07/13/2022   Hypercoagulable state due to atrial flutter (HCC) 02/20/2022   Preop cardiovascular exam 02/20/2022   Loose left total knee arthroplasty (HCC)  02/14/2022   Aortic stenosis 08/18/2021   Chronic kidney disease, stage 3a (HCC) 08/18/2021   Ascending aorta dilation (HCC) 08/18/2021   Bicuspid aortic valve 08/18/2021   Heart failure with mildly reduced ejection fraction (HFmrEF, 41-49%) (HCC) 08/18/2021   AKI (acute kidney injury) (HCC) 12/29/2020   Hypokalemia 12/29/2020   Obstructive sleep apnea 12/29/2020   Thrombocytopenia (HCC) 12/29/2020   Hematuria 12/29/2020   Wears glasses    Type 2 diabetes mellitus treated with insulin  (HCC)    Right ureteral stone    RBBB    PAF (paroxysmal atrial fibrillation) (HCC)    Multiple renal cysts    Impotence of organic origin    History of melanoma excision    History of kidney stones    History of bladder stone    Heart murmur    First degree heart block    Essential hypertension    BPH associated with nocturia    Arthritis    Anxiety    Tendinopathy of right rotator cuff 01/29/2020   Tendinopathy of right biceps tendon 01/29/2020   Impingement syndrome of right shoulder 01/29/2020   Arthrosis of right acromioclavicular joint 01/29/2020   Chronic right shoulder pain 01/01/2020   Status post total left knee replacement 01/01/2020   Left-sided low back pain with left-sided sciatica 01/23/2019   Poorly controlled type 2 diabetes mellitus with circulatory disorder (HCC) 01/18/2018  History of bilateral knee replacement 02/07/2017   Primary osteoarthritis of left knee    History of renal cell carcinoma 01/2017   BPH with obstruction/lower urinary tract symptoms 01/04/2017   Rotator cuff syndrome of left shoulder 04/17/2016   Nephrolithiasis 05/17/2014   Encounter for long-term (current) use of other medications 07/27/2013   Long term current use of anticoagulant therapy    CAD (coronary artery disease) 04/02/2013   Hypertensive heart disease 04/02/2013   RBBB (right bundle branch block with left anterior fascicular block) 04/02/2013   Atrial flutter (HCC)    Nonischemic  cardiomyopathy (HCC)    Obesity (BMI 30-39.9)    History of kidney stones    Osteoarthritis    Hyperlipidemia    Past Medical History:  Diagnosis Date   Anxiety    Arthritis    everywhere   Arthrosis of right acromioclavicular joint 01/29/2020   Atrial flutter (HCC)    Onset May 2014 Cardioversion done    BPH associated with nocturia    BPH with obstruction/lower urinary tract symptoms 01/04/2017   CAD (coronary artery disease) 04/02/2013   Cath 2012 moderate mid LAD lesion, otherwise not much disease    Cancer (HCC)    Diabetes (HCC) 01/18/2018   Dilated aortic root (HCC)    aortic root 44 mm, ascending aorta 46 mm 12/22/21 echo   ED (erectile dysfunction)    Elevated PSA    Encounter for long-term (current) use of other medications 07/27/2013   Essential hypertension, benign    First degree heart block    Heart failure with mildly reduced ejection fraction (HFmrEF, 41-49%) (HCC) 08/18/2021   Hx of tachycardia mediated cardiomyopathy  TTE 12/27/2020: EF 20-25 TTE 05/10/2021: EF 30-35 TTE 12/22/2021: EF 50-55 (after CTI ablation) TTE 02/19/2023: EF 45-50, global HK, GR 2 DD, normal RVSF, severe LAE, mild MR, mild-moderate AI, moderate aortic stenosis (V-max 300 cm/s, mean gradient 19 mmHg, DI 0.33), ascending aorta 4.2 cm, RAP 3    Heart murmur    mild-moderate AS and AR 01/11/22   History of bilateral knee replacement 02/07/2017   History of bladder stone    History of kidney stones    History of melanoma excision    June 2016  --- s/p  excision right  leg (per pt localized and no recurrence)   Hypertensive heart disease 04/02/2013   Impingement syndrome of right shoulder 01/29/2020   Impotence of organic origin    Left-sided low back pain with left-sided sciatica 01/23/2019   Long term current use of anticoagulant therapy    Mixed hyperlipidemia    slightly   Multiple renal cysts    bilateral   Nephrolithiasis 05/17/2014   Nonischemic cardiomyopathy (HCC)    Osteoarthritis     PAF (paroxysmal atrial fibrillation) (HCC)    followed by cardiology  (takes ASA)   Peripheral vascular disease (HCC)    RBBB    RBBB (right bundle branch block with left anterior fascicular block) 04/02/2013   Renal oncocytoma of left kidney 06/18/2013   s/p left robotic assited laparoscopic partial nephrectomy 06/18/13   Right ureteral stone    Rotator cuff syndrome of left shoulder 04/17/2016   Status post total left knee replacement 01/01/2020   Tendinopathy of right biceps tendon 01/29/2020   Tendinopathy of right rotator cuff 01/29/2020   Type 2 diabetes mellitus treated with insulin  [facility])    endocrinologist-  dr kassie   Wears glasses     Family History  Problem Relation Age of  Onset   Arthritis Mother    Hypertension Mother    Arthritis Father    Hypertension Father    Diabetes Father    Heart attack Brother 39   Diabetes Brother    Cancer Maternal Uncle        unknown type   Prostate cancer Paternal Uncle 58 - 43       metastatic    Past Surgical History:  Procedure Laterality Date   A-FLUTTER ABLATION N/A 10/27/2021   Procedure: A-FLUTTER ABLATION;  Surgeon: Cindie Ole DASEN, MD;  Location: [facility] INVASIVE CV LAB;  Service: Cardiovascular;  Laterality: N/A;   CARDIAC CATHETERIZATION  08-17-2011  DR JACQUES TILLEY   POSSIBLE MODERATE LAD STENOSIS JUST AFTER THE BIFURCATION OF LARGE DIAGONAL BRANCH/ LVEF  40-45%/ MILD GLOBAL HYPODINESIS (CATH DONE FOR ABNORMAL STRESS TEST OF FIXED LATERAL WALL DEFECT & BIFASCICULAR BLAOCK)   CARDIOVERSION N/A 04/02/2013   Procedure: CARDIOVERSION;  Surgeon: Elsie GORMAN Somerset, MD;  Location: [facility] OR;  Service: Cardiovascular;  Laterality: N/A;   CARDIOVERSION N/A 12/27/2020   Procedure: CARDIOVERSION;  Surgeon: Raford Riggs, MD;  Location: [facility] ENDOSCOPY;  Service: Cardiovascular;  Laterality: N/A;   CYSTOSCOPY WITH LITHOLAPAXY N/A 05/18/2015   Procedure: CYSTOSCOPY WITH LITHOLAPAXY;  Surgeon: Alm Fragmin, MD;  Location: [facility];  Service: Urology;  Laterality: N/A;   CYSTOSCOPY WITH LITHOLAPAXY N/A 01/04/2017   Procedure: CYSTOSCOPY WITH LITHOLAPAXY, Holmium Laser;  Surgeon: Morene LELON Salines, MD;  Location: [facility];  Service: Urology;  Laterality: N/A;   CYSTOSCOPY WITH RETROGRADE PYELOGRAM, URETEROSCOPY AND STENT PLACEMENT  11/03/2012   Procedure: CYSTOSCOPY WITH RETROGRADE PYELOGRAM, URETEROSCOPY AND STENT PLACEMENT;  Surgeon: Alm GORMAN Fragmin, MD;  Location: [facility];  Service: Urology;  Laterality: Left;  Cystoscopy/Left Retrograde/Ureteroscopy/Holmium Laser Litho/Double J Stent   CYSTOSCOPY WITH URETEROSCOPY, STONE BASKETRY AND STENT PLACEMENT Right 05/15/2019   Procedure: CYSTOSCOPY WITH URETEROSCOPY, LASER LITHOTRIPSY STONE BASKETRY AND STENT PLACEMENT, RIGHT RETROGRADE;  Surgeon: Salines Morene LELON, MD;  Location: [facility];  Service: Urology;  Laterality: Right;   HOLMIUM LASER APPLICATION  11/03/2012   Procedure: HOLMIUM LASER APPLICATION;  Surgeon: Alm GORMAN Fragmin, MD;  Location: [facility];  Service: Urology;  Laterality: Left;   HOLMIUM LASER APPLICATION N/A 05/18/2015   Procedure: HOLMIUM LASER APPLICATION;  Surgeon: Alm Fragmin, MD;  Location: [facility];  Service: Urology;  Laterality: N/A;   KNEE SURGERY Bilateral 1978  &  1974   LYMPHADENECTOMY Bilateral 04/22/2023   Procedure: BILATERAL PELVIC LYMPHADENECTOMY;  Surgeon: Renda Glance, MD;  Location: [facility];  Service: Urology;  Laterality: Bilateral;   MELANOMA EXCISION Bilateral 04/2015   20th-- left leg //  1st-- right leg w/ skin graft from right thigh (no lymph node bx)   NEPHROLITHOTOMY Right 05/17/2014   Procedure: NEPHROLITHOTOMY PERCUTANEOUS;  Surgeon: Alm GORMAN Fragmin, MD;  Location: [facility];  Service: Urology;  Laterality: Right;   PILONIDAL CYST EXCISION     PROSTATE BIOPSY     RIGHT/LEFT HEART CATH AND CORONARY ANGIOGRAPHY N/A 09/27/2023   Procedure: RIGHT/LEFT  HEART CATH AND CORONARY ANGIOGRAPHY;  Surgeon: Swaziland, Peter M, MD;  Location: [facility] INVASIVE CV LAB;  Service: Cardiovascular;  Laterality: N/A;   ROBOT ASSISTED LAPAROSCOPIC RADICAL PROSTATECTOMY N/A 04/22/2023   Procedure: XI ROBOTIC ASSISTED LAPAROSCOPIC RADICAL PROSTATECTOMY LEVEL 2;  Surgeon: Renda Glance, MD;  Location: [facility];  Service: Urology;  Laterality: N/A;   ROBOTIC ASSITED PARTIAL NEPHRECTOMY Left 06/18/2013   Procedure:  ROBOTIC ASSISTED PARTIAL NEPHRECTOMY;  Surgeon: Noretta Ferrara, MD;  Location: [facility];  Service: Urology;  Laterality: Left;   ROTATOR CUFF REPAIR Left 07/2016   SHOULDER ARTHROSCOPY WITH ROTATOR CUFF REPAIR AND SUBACROMIAL DECOMPRESSION Right 05/27/2020   Procedure: RIGHT SHOULDER ARTHROSCOPY WITH EXTENSIVE DEBRIDEMENT, DISTAL CLAVICLE EXCISION AND SUBACROMIAL DECOMPRESSION;  Surgeon: Jerri Kay HERO, MD;  Location: [facility];  Service: Orthopedics;  Laterality: Right;   TEE WITH CARDIOVERSION  12/27/2020   TEE WITHOUT CARDIOVERSION N/A 12/27/2020   Procedure: TRANSESOPHAGEAL ECHOCARDIOGRAM (TEE);  Surgeon: Raford Riggs, MD;  Location: [facility] ENDOSCOPY;  Service: Cardiovascular;  Laterality: N/A;   TONSILLECTOMY  21  (age 43)   TOTAL KNEE ARTHROPLASTY Left 02/07/2017   Procedure: LEFT TOTAL KNEE ARTHROPLASTY;  Surgeon: Kay HERO Jerri, MD;  Location: [facility] OR;  Service: Orthopedics;  Laterality: Left;   TOTAL KNEE ARTHROPLASTY Right 04/25/2017   Procedure: RIGHT TOTAL KNEE ARTHROPLASTY;  Surgeon: Jerri Kay HERO, MD;  Location: [facility] OR;  Service: Orthopedics;  Laterality: Right;   TOTAL KNEE REVISION Left 07/13/2022   Procedure: LEFT TOTAL KNEE REVISION, TIBIAL COMPONENT;  Surgeon: Jerri Kay HERO, MD;  Location: [facility] OR;  Service: Orthopedics;  Laterality: Left;   TRANSURETHRAL RESECTION OF PROSTATE N/A 01/04/2017   Procedure: TRANSURETHRAL RESECTION OF THE PROSTATE (TURP);  Surgeon: Morene LELON Salines, MD;  Location: [facility];  Service: Urology;  Laterality: N/A;    Social History   Occupational History   Occupation: Emergency planning/management officer, Mining engineer  Tobacco Use   Smoking status: Former    Current packs/day: 0.00    Average packs/day: 1 pack/day for 6.0 years (6.0 ttl pk-yrs)    Types: Cigarettes    Start date: 11/01/1963    Quit date: 10/31/1969    Years since quitting: 54.7   Smokeless tobacco: Never  Vaping Use   Vaping status: Never Used  Substance and Sexual Activity   Alcohol use: Yes    Alcohol/week: 0.0 standard drinks of alcohol    Comment: OCCASIONAL   Drug use: No   Sexual activity: Never

## 2024-07-17 ENCOUNTER — Telehealth: Payer: Self-pay | Admitting: Radiation Oncology

## 2024-07-17 ENCOUNTER — Other Ambulatory Visit: Payer: Self-pay | Admitting: Urology

## 2024-07-17 DIAGNOSIS — C61 Malignant neoplasm of prostate: Secondary | ICD-10-CM

## 2024-07-17 NOTE — Telephone Encounter (Signed)
 8/29 Outgoing Referral forward to [facility]

## 2024-07-17 NOTE — Progress Notes (Signed)
 RN spoke with patient after final radiation treatment.  Patient is experiencing some side effects with diarrhea and constipation, however, feels like he is managing well considering.  Patient will have repeat PSA on 9/12 and follow up with Dr. Renda the following week.  All questions answered, no additional needs at this time.

## 2024-08-03 NOTE — Progress Notes (Signed)
  Radiation Oncology         [phone]) [phone] ________________________________  Name: Matthew Schmitt MRN: [id number]  Date of Service: 08/04/2024  DOB: 12-Feb-1951  Post Treatment Telephone Note  Diagnosis:  73 y.o. gentleman with a solitary osseous metastasis in the right iliac bone and a rising PSA of 0.68 s/p RALP 04/2023 for Stage pT3aN0, Gleason 4+5 prostate cancer   Pre Treatment IPSS Score:   The patient {WAS/WAS [phone]::was not} available for call today.   Symptoms of fatigue {ACTIONS; HAVE/HAVE NOT:19434} improved since completing therapy.  Symptoms of bladder changes {ACTIONS; HAVE/HAVE WNU:80565} improved since completing therapy. Current symptoms include ***, and medications for bladder symptoms include ***.  Symptoms of bowel changes {ACTIONS; HAVE/HAVE NOT:19434} improved since completing therapy. Current symptoms include ***, and medications for bowel symptoms include ***.   Post Treatment IPSS Score: IPSS Questionnaire (AUA-7): Over the past month.   1)  How often have you had a sensation of not emptying your bladder completely after you finish urinating?  {Rating:19227}  2)  How often have you had to urinate again less than two hours after you finished urinating? {Rating:19227}  3)  How often have you found you stopped and started again several times when you urinated?  {Rating:19227}  4) How difficult have you found it to postpone urination?  {Rating:19227}  5) How often have you had a weak urinary stream?  {Rating:19227}  6) How often have you had to push or strain to begin urination?  {Rating:19227}  7) How many times did you most typically get up to urinate from the time you went to bed until the time you got up in the morning?  {Rating:19228}  Total score:  {0-35:19561} Which indicates {Mild/Moderate/Severe:20260} symptoms  0-7 mildly symptomatic   8-19 moderately symptomatic   20-35 severely symptomatic   Patient (has a scheduled follow up visit with  his urologist, Dr. ***, on ***/ does not currently have any scheduled follow up with his urologist to his knowledge so he was advised to call [facility] to schedule his post-treatment follow up with Dr. GARLON for ongoing surveillance. He was counseled that PSA levels will be drawn in the urology office, and was reassured that additional time is expected to improve bowel and bladder symptoms. He was encouraged to call back with concerns or questions regarding radiation.

## 2024-08-04 ENCOUNTER — Ambulatory Visit
Admission: RE | Admit: 2024-08-04 | Discharge: 2024-08-04 | Disposition: A | Discharge status: Home / Self Care | Source: Ambulatory Visit | Attending: Radiation Oncology | Admitting: Radiation Oncology

## 2024-08-04 DIAGNOSIS — C61 Malignant neoplasm of prostate: Secondary | ICD-10-CM | POA: Insufficient documentation

## 2024-08-07 ENCOUNTER — Encounter: Payer: Self-pay | Admitting: *Deleted

## 2024-08-07 NOTE — Progress Notes (Signed)
 Patient had PSA at [facility], PSA <0.1.  Dr. Renda will refer patient to Dr. Lovie for discussion and evaluation of his incontinence.

## 2024-08-18 ENCOUNTER — Ambulatory Visit: Admitting: Pharmacist

## 2024-09-14 ENCOUNTER — Ambulatory Visit: Admitting: Internal Medicine

## 2024-09-18 ENCOUNTER — Ambulatory Visit: Attending: Cardiovascular Disease | Admitting: Pharmacist

## 2024-09-18 VITALS — BP 143/75

## 2024-09-18 DIAGNOSIS — I110 Hypertensive heart disease with heart failure: Secondary | ICD-10-CM | POA: Diagnosis present

## 2024-09-18 NOTE — Assessment & Plan Note (Signed)
 Assessment: Blood pressure above goal in clinic and at home He is not taking his blood pressure medication regularly as he has all his pills in 1 pillbox and he only wants to take his diuretics when he absolutely has to due to swelling.  Patient suffers from urinary incontinence due to his prostate cancer and anything that makes him pee more significantly impacts his quality of life He was open to me circling the medications I want him to take daily He is aware that the best way to control his swelling is to take diuretics on a regular basis but I do understand that this has a significant impact on his quality of life I reviewed the pathophysiology of heart failure and that his pumping function had previously recovered but I am concerned that if he is not taking his medications on a regular basis that I will decline again  Plan: Patient will start taking valsartan  320 mg daily, amlodipine  10 mg daily, metoprolol  succinate 50 mg daily on a regular basis-i.e. every day I strongly encouraged him to take his spironolactone  and Farxiga  every day but was honest that they do have somewhat of a diuretic property but not nearly as strong as furosemide  Continue to take furosemide  as needed Continue to monitor blood pressure at home.  I asked him if he gets an elevated reading on the first check to wait 1 or 2 minutes and recheck Follow-up in about a month and a half

## 2024-09-18 NOTE — Progress Notes (Signed)
 Patient ID: Matthew Schmitt                 DOB: 07-Jun-1951                      MRN: [id number]      HPI: BRAYON BIELEFELD is a 73 y.o. male referred by Dr. Jeffrie to HTN clinic. PMH is significant for aortic stenosis, afib, HTN, prostate cancer, HFrecEF (current EF 45-50%).  Patient was seen by Dr. Jeffrie 03/10/24. His blood pressure was 190/100. Patient reports that he had missed 4 days of medications while on a fishing trip. Valsartan  was increased to 320mg  and amlodipine  5mg  was added. Of note, patient prefers to avoid diuretics due to urinary incontinence. However on 5/14 patient called reporting SOB and swelling in the upper torso and legs. Furosemide  40mg  daily was added.  I saw patient 74/20/25. BP in clinic was 140/64. He had run out of valsartan . This was resumed at 160mg  daily. He was about to begin radiation for prostate cancer. I asked him to call [facility] and get his Rx expedited.   I saw patient in clinic on 06/18/24. His BP in clinic was 148/72. He reported that home readings had been elevated in the 160s and 170s systolic.  He cannot take his furosemide  or spironolactone  every day due to urinary incontinence.  Was experiencing significant diarrhea due to his radiation. Amlodipine  was increased to 10mg  daily.  Last seen 07/13/24. BP in clinic 148/76. He had not been checking much at home but stated in the morning it is 160s to 170s systolic and improves to 140s 849d later in the day. Valsartan  was increased to 320mg  daily.   Patient presents today for follow-up.  He is still having significant issues with urinary incontinence and is just very extremely frustrated with having to wear diapers and potentially leaking.  He wants to know what he can do to prevent his swelling.  We discussed that limiting sodium intake and limiting fluid intake (2L) and taking diuretics on a regular basis are really the only ways to prevent swelling for 10 manage it is an effective way.  Currently patient takes his  diuretics when he starts to feel swollen.  Will take diuretics for 3 days and then stop.  He states that he drinks over a gallon of water  a day and was not receptive to my suggestion that he try to keep it closer to 2 L.    Unfortunately has not been taking his other medications regularly either.  He only takes his other pills on the days that he takes his diuretics.  Reporting that a weeks worth of medication will last him close to a month.  Home blood pressures elevated anywhere from 140s to 180s.  He did bring in his blood pressure cuff today and it was found to be accurate  Took BP meds yesterday AM and Tuesday Takes for a few days in a row when he swells but the other days he doesn't take anything  Old OMRON upper arm- left arm 162/86 HR 76 149/78 148/80 clinic 143/75  Current HTN meds: valsartan  320mg  daily, amlodipine  10mg  daily, furosemide  40mg  as needed, spironolactone  25mg  daily, metoprolol  succinate 50mg  daily Previously tried:  BP goal: <130/80  Family History:  Family History  Problem Relation Age of Onset   Arthritis Mother    Hypertension Mother    Arthritis Father    Hypertension Father    Diabetes Father  Heart attack Brother 66   Diabetes Brother    Cancer Maternal Uncle        unknown type   Prostate cancer Paternal Uncle 8 - 24       metastatic     Social History:  Social History   Socioeconomic History   Marital status: Married    Spouse name: Not on file   Number of children: 2   Years of education: 16   Highest education level: Not on file  Occupational History   Occupation: Emergency Planning/management Officer, Mining Engineer  Tobacco Use   Smoking status: Former    Current packs/day: 0.00    Average packs/day: 1 pack/day for 6.0 years (6.0 ttl pk-yrs)    Types: Cigarettes    Start date: 11/01/1963    Quit date: 10/31/1969    Years since quitting: 54.9   Smokeless tobacco: Never  Vaping Use   Vaping status: Never Used  Substance and Sexual Activity    Alcohol use: Yes    Alcohol/week: 0.0 standard drinks of alcohol    Comment: OCCASIONAL   Drug use: No   Sexual activity: Never  Other Topics Concern   Not on file  Social History Narrative   Regular exercise-no   Social Drivers of Health   Financial Resource Strain: Not on file  Food Insecurity: No Food Insecurity (04/24/2024)   Hunger Vital Sign    Worried About Running Out of Food in the Last Year: Never true    Ran Out of Food in the Last Year: Never true  Transportation Needs: No Transportation Needs (04/24/2024)   PRAPARE - Administrator, Civil Service (Medical): No    Lack of Transportation (Non-Medical): No  Physical Activity: Not on file  Stress: Not on file  Social Connections: Not on file  Intimate Partner Violence: Not At Risk (04/24/2024)   Humiliation, Afraid, Rape, and Kick questionnaire    Fear of Current or Ex-Partner: No    Emotionally Abused: No    Physically Abused: No    Sexually Abused: No    Diet:  Crystal lite tea or peach mango Breakfast sandwich: egg and cheese Lunch: couple packs of crackers Dinner: chicken or tomato sandwich  Exercise: walks 0.5 miles per day (into cancer center), yard work on Sat  Home BP readings:     Wt Readings from Last 3 Encounters:  06/04/24 234 lb (106.1 kg)  04/24/24 230 lb 2 oz (104.4 kg)  03/10/24 239 lb (108.4 kg)   BP Readings from Last 3 Encounters:  09/18/24 (!) 143/75  07/13/24 (!) 148/76  06/18/24 (!) 148/72   Pulse Readings from Last 3 Encounters:  07/13/24 79  06/18/24 73  06/04/24 80    Renal function: CrCl cannot be calculated (Patient's most recent lab result is older than the maximum 21 days allowed.).  Past Medical History:  Diagnosis Date   Anxiety    Arthritis    everywhere   Arthrosis of right acromioclavicular joint 01/29/2020   Atrial flutter [facility])    Onset May 2014 Cardioversion done    BPH associated with nocturia    BPH with obstruction/lower urinary tract  symptoms 01/04/2017   CAD (coronary artery disease) 04/02/2013   Cath 2012 moderate mid LAD lesion, otherwise not much disease    Cancer (HCC)    Diabetes (HCC) 01/18/2018   Dilated aortic root    aortic root 44 mm, ascending aorta 46 mm 12/22/21 echo   ED (erectile dysfunction)  Elevated PSA    Encounter for long-term (current) use of other medications 07/27/2013   Essential hypertension, benign    First degree heart block    Heart failure with mildly reduced ejection fraction (HFmrEF, 41-49%) (HCC) 08/18/2021   Hx of tachycardia mediated cardiomyopathy  TTE 12/27/2020: EF 20-25 TTE 05/10/2021: EF 30-35 TTE 12/22/2021: EF 50-55 (after CTI ablation) TTE 02/19/2023: EF 45-50, global HK, GR 2 DD, normal RVSF, severe LAE, mild MR, mild-moderate AI, moderate aortic stenosis (V-max 300 cm/s, mean gradient 19 mmHg, DI 0.33), ascending aorta 4.2 cm, RAP 3    Heart murmur    mild-moderate AS and AR 01/11/22   History of bilateral knee replacement 02/07/2017   History of bladder stone    History of kidney stones    History of melanoma excision    June 2016  --- s/p  excision right  leg (per pt localized and no recurrence)   Hypertensive heart disease 04/02/2013   Impingement syndrome of right shoulder 01/29/2020   Impotence of organic origin    Left-sided low back pain with left-sided sciatica 01/23/2019   Long term current use of anticoagulant therapy    Mixed hyperlipidemia    slightly   Multiple renal cysts    bilateral   Nephrolithiasis 05/17/2014   Nonischemic cardiomyopathy (HCC)    Osteoarthritis    PAF (paroxysmal atrial fibrillation) (HCC)    followed by cardiology  (takes ASA)   Peripheral vascular disease    RBBB    RBBB (right bundle branch block with left anterior fascicular block) 04/02/2013   Renal oncocytoma of left kidney 06/18/2013   s/p left robotic assited laparoscopic partial nephrectomy 06/18/13   Right ureteral stone    Rotator cuff syndrome of left shoulder  04/17/2016   Status post total left knee replacement 01/01/2020   Tendinopathy of right biceps tendon 01/29/2020   Tendinopathy of right rotator cuff 01/29/2020   Type 2 diabetes mellitus treated with insulin  [facility])    endocrinologist-  dr kassie   Wears glasses     Current Outpatient Medications on File Prior to Visit  Medication Sig Dispense Refill   amLODipine  (NORVASC ) 10 MG tablet Take 1 tablet (10 mg total) by mouth daily. 90 tablet 3   aspirin  EC 81 MG tablet Take 1 tablet (81 mg total) by mouth daily. Swallow whole.     dapagliflozin  propanediol (FARXIGA ) 10 MG TABS tablet Take 1 tablet (10 mg total) by mouth daily before breakfast. 90 tablet 3   furosemide  (LASIX ) 40 MG tablet Take 1 tablet (40 mg total) by mouth daily. 90 tablet 3   glucose blood (ONETOUCH ULTRA) test strip Use to check blood sugar 1-2 times a day 200 strip 11   insulin  degludec (TRESIBA  FLEXTOUCH) 200 UNIT/ML FlexTouch Pen Inject 90 Units into the skin daily. 45 mL 2   Insulin  Lispro-aabc (LYUMJEV  KWIKPEN) 200 UNIT/ML KwikPen Inject 6-10 Units into the skin 3 (three) times daily before meals. Inject 6-10 units before meals, under skin (Patient taking differently: Inject 6-10 Units into the skin daily with supper. Inject 6-10 units before meals, under skin) 18 mL 2   Insulin  Pen Needle 32G X 4 MM MISC Use 4x a day 400 each 3   Lancets (ONETOUCH DELICA PLUS LANCET33G) MISC 1 each by Other route 2 (two) times daily. E11.9 200 each 3   metoprolol  succinate (TOPROL -XL) 50 MG 24 hr tablet Take 1 tablet (50 mg total) by mouth daily. Take with or immediately following a  meal. 90 tablet 3   nitroGLYCERIN  (NITROSTAT ) 0.4 MG SL tablet Place 1 tablet (0.4 mg total) under the tongue every 5 (five) minutes x 3 doses as needed for chest pain. 25 tablet 1   rosuvastatin  (CRESTOR ) 20 MG tablet Take 1 tablet (20 mg total) by mouth daily. 90 tablet 3   spironolactone  (ALDACTONE ) 25 MG tablet Take 1 tablet (25 mg total) by mouth daily.  90 tablet 3   trolamine salicylate (ASPERCREME) 10 % cream Apply 1 application  topically 3 (three) times daily as needed for muscle pain.     valsartan  (DIOVAN ) 320 MG tablet Take 1 tablet (320 mg total) by mouth daily. 90 tablet 3   No current facility-administered medications on file prior to visit.    Allergies  Allergen Reactions   Actos [Pioglitazone] Swelling and Cough    SWELLING REACTION UNSPECIFIED  EDEMA    Blood pressure (!) 143/75.   Assessment/Plan: HYPERTENSION CONTROL Vitals:   09/18/24 1603 09/18/24 1604  BP: (!) 148/80 (!) 143/75    The patient's blood pressure is elevated above target today.  In order to address the patient's elevated BP: A current anti-hypertensive medication was adjusted today.      1. Hypertension -  Hypertensive heart disease Assessment: Blood pressure above goal in clinic and at home He is not taking his blood pressure medication regularly as he has all his pills in 1 pillbox and he only wants to take his diuretics when he absolutely has to due to swelling.  Patient suffers from urinary incontinence due to his prostate cancer and anything that makes him pee more significantly impacts his quality of life He was open to me circling the medications I want him to take daily He is aware that the best way to control his swelling is to take diuretics on a regular basis but I do understand that this has a significant impact on his quality of life I reviewed the pathophysiology of heart failure and that his pumping function had previously recovered but I am concerned that if he is not taking his medications on a regular basis that I will decline again  Plan: Patient will start taking valsartan  320 mg daily, amlodipine  10 mg daily, metoprolol  succinate 50 mg daily on a regular basis-i.e. every day I strongly encouraged him to take his spironolactone  and Farxiga  every day but was honest that they do have somewhat of a diuretic property but not  nearly as strong as furosemide  Continue to take furosemide  as needed Continue to monitor blood pressure at home.  I asked him if he gets an elevated reading on the first check to wait 1 or 2 minutes and recheck Follow-up in about a month and a half     Thank you  Zeek Rostron D Nashonda Limberg, Pharm.JONETTA SARAN, CPP [facility] A Division of [facility] [address] [place], [place] [address]  Phone: [phone]; Fax: [phone]

## 2024-09-18 NOTE — Patient Instructions (Signed)
 Please take your medications (other than diruetics) everyday Continue to monitor blood pressure Please repeat blood pressure 1 -2 min later if high

## 2024-09-21 ENCOUNTER — Encounter: Payer: Self-pay | Admitting: Radiology

## 2024-09-22 ENCOUNTER — Telehealth: Payer: Self-pay | Admitting: Cardiology

## 2024-09-22 MED ORDER — METOPROLOL SUCCINATE ER 50 MG PO TB24: 50.0000 mg | ORAL_TABLET | Freq: Every day | ORAL | 1 refills | Status: AC

## 2024-09-22 NOTE — Telephone Encounter (Signed)
*  STAT* If patient is at the pharmacy, call can be transferred to refill team.   1. Which medications need to be refilled? (please list name of each medication and dose if known)   metoprolol  succinate (TOPROL -XL) 50 MG 24 hr tablet     4. Which pharmacy/location (including street and city if local pharmacy) is medication to be sent to? [facility] - [place], [place] - [address]    5. Do they need a 30 day or 90 day supply? 90

## 2024-09-22 NOTE — Telephone Encounter (Signed)
 Pt's medication was sent to pt's pharmacy as requested. Confirmation received.

## 2024-10-02 ENCOUNTER — Inpatient Hospital Stay: Attending: Adult Health | Admitting: *Deleted

## 2024-10-02 ENCOUNTER — Encounter: Payer: Self-pay | Admitting: *Deleted

## 2024-10-02 DIAGNOSIS — C61 Malignant neoplasm of prostate: Secondary | ICD-10-CM

## 2024-10-02 NOTE — Progress Notes (Signed)
 SCP reviewed and completed.Latest PSA was < 0.1 in Sept. Repeat in Dec at [facility]. Pt will receive Eligard at this time. A copy of prostate summary has been sent to pt and PCP.

## 2024-10-08 ENCOUNTER — Encounter: Payer: Self-pay | Admitting: Internal Medicine

## 2024-10-08 ENCOUNTER — Other Ambulatory Visit

## 2024-10-08 ENCOUNTER — Ambulatory Visit (INDEPENDENT_AMBULATORY_CARE_PROVIDER_SITE_OTHER): Admitting: Internal Medicine

## 2024-10-08 VITALS — BP 140/70 | HR 86 | Ht 71.0 in | Wt 235.6 lb

## 2024-10-08 DIAGNOSIS — E785 Hyperlipidemia, unspecified: Secondary | ICD-10-CM

## 2024-10-08 DIAGNOSIS — Z794 Long term (current) use of insulin: Secondary | ICD-10-CM | POA: Diagnosis not present

## 2024-10-08 DIAGNOSIS — E1165 Type 2 diabetes mellitus with hyperglycemia: Secondary | ICD-10-CM | POA: Diagnosis not present

## 2024-10-08 DIAGNOSIS — E1159 Type 2 diabetes mellitus with other circulatory complications: Secondary | ICD-10-CM

## 2024-10-08 DIAGNOSIS — Z7984 Long term (current) use of oral hypoglycemic drugs: Secondary | ICD-10-CM | POA: Diagnosis not present

## 2024-10-08 LAB — POCT GLYCOSYLATED HEMOGLOBIN (HGB A1C): Hemoglobin A1C: 6.4 % — AB (ref 4.0–5.6)

## 2024-10-08 NOTE — Patient Instructions (Addendum)
 Please use the following regimen: - Farxiga  10 mg before dinner - Tresiba  90 units daily - Lyumjev  6 units before larger dinners  Check some sugars later in the day - midday and bedtime.  Please return in 4 months with your sugar log.

## 2024-10-08 NOTE — Progress Notes (Addendum)
 Patient ID: Matthew Schmitt, male   DOB: 1951-09-19, 73 y.o.   MRN: [id number]  HPI: Matthew Schmitt is a 73 y.o.-year-old male, returning for follow-up for DM2, dx in 2002, insulin -dependent since 2017, uncontrolled, with complications (CAD, nonischemic cardiomyopathy, atrial flutter/PAF). Pt. previously saw Dr. Kassie, but last visit with me 6 months ago.  Interim history: No blurry vision, nausea, chest pain.  In 2024, he was diagnosed with Pr CA. He had a robotic radical prostatectomy 04/22/2023.  He then developed bladder spasms and incontinence.  He is now undergoing radiation therapy.  He had a PET scan on 04/21/2024 that showed a single skeletal metastasis in the right iliac bone.  Since last visit, he had radiation therapy.  He continues to have urinary incontinence and may need to have a procedure for this.  Currently using diapers.  Reviewed HbA1c: Lab Results  Component Value Date   HGBA1C 6.5 (A) 06/04/2024   HGBA1C 8.1 (A) 12/03/2023   HGBA1C 8.6 (A) 07/11/2023   HGBA1C 7.6 (H) 04/10/2023   HGBA1C 7.6 (A) 01/30/2023   HGBA1C 8.7 (A) 11/15/2022   HGBA1C 7.4 (H) 06/13/2022   HGBA1C 7.9 (H) 05/04/2022   HGBA1C 8.6 (H) 04/06/2022   HGBA1C 8.5 (H) 03/06/2022   At last visit, he was on: - Farxiga  10 mg in am - Rybelsus  3 mg before b'fast - added 06/2022 >> stopped b/c no refills - Basaglar  90 units at bedtime He developed cough and edema from Actos. He tried Metformin  >> gained weight.  He tried Trulicity  before  - no GI sxs.  We changed to: - Tresiba  90 units daily - Farxiga  10 mg in am >> in the evening (before dinner) - Lyumjev  6 to 10 units before each meal  >> Lyumjev  6 units ONLY before dinner >> 1x a week now Previously on Ozempic  0.5 but stopped due to price and did not restart as he did not feel that this was helping too much.  Pt checks his sugars 1x a day and they are: - am:140-200 >> 131-191, 279 >> 77-179, 189 >> 71-134, 189, 211 - 2h after b'fast: n/c >> 155 >>  n/c - before lunch: n/c - 2h after lunch: n/c - before dinner: n/c >> 98-121, 196 - 2h after dinner: n/c - bedtime: n/c - nighttime: n/c Lowest sugar was 22  - years ago; more recently: 131 >> 77 >> 71 Highest sugar was 312 >> ... 279 >> 189 >> 211  Glucometer: One Touch ultra  - + Mild CKD, last BUN/creatinine:  Lab Results  Component Value Date   BUN 26 07/13/2024   BUN 28 (H) 05/08/2024   CREATININE 0.94 07/13/2024   CREATININE 1.18 05/08/2024   Lab Results  Component Value Date   MICRALBCREAT 17.4 09/20/2014  On Entresto .  He has incontinence and was previously not able to give us  a urine sample.  -+ HL; last set of lipids: Lab Results  Component Value Date   CHOL 143 12/03/2023   HDL 49 12/03/2023   LDLCALC 78 12/03/2023   TRIG 79 12/03/2023   CHOLHDL 2.9 12/03/2023  On Crestor  20 mg daily.  - last eye exam was on 04/10/2023. No DR.  - no numbness and tingling in his feet.  Last foot exam 12/03/2023.  He did not establish care with a podiatrist since last visit.  He also has a history of nephrolithiasis, right bundle branch block. He also has atrial flutter.  ROS: + see HPI  Past Medical  History:  Diagnosis Date   Anxiety    Arthritis    everywhere   Arthrosis of right acromioclavicular joint 01/29/2020   Atrial flutter [facility])    Onset May 2014 Cardioversion done    BPH associated with nocturia    BPH with obstruction/lower urinary tract symptoms 01/04/2017   CAD (coronary artery disease) 04/02/2013   Cath 2012 moderate mid LAD lesion, otherwise not much disease    Cancer (HCC)    Diabetes (HCC) 01/18/2018   Dilated aortic root    aortic root 44 mm, ascending aorta 46 mm 12/22/21 echo   ED (erectile dysfunction)    Elevated PSA    Encounter for long-term (current) use of other medications 07/27/2013   Essential hypertension, benign    First degree heart block    Heart failure with mildly reduced ejection fraction (HFmrEF, 41-49%) (HCC) 08/18/2021    Hx of tachycardia mediated cardiomyopathy  TTE 12/27/2020: EF 20-25 TTE 05/10/2021: EF 30-35 TTE 12/22/2021: EF 50-55 (after CTI ablation) TTE 02/19/2023: EF 45-50, global HK, GR 2 DD, normal RVSF, severe LAE, mild MR, mild-moderate AI, moderate aortic stenosis (V-max 300 cm/s, mean gradient 19 mmHg, DI 0.33), ascending aorta 4.2 cm, RAP 3    Heart murmur    mild-moderate AS and AR 01/11/22   History of bilateral knee replacement 02/07/2017   History of bladder stone    History of kidney stones    History of melanoma excision    June 2016  --- s/p  excision right  leg (per pt localized and no recurrence)   Hypertensive heart disease 04/02/2013   Impingement syndrome of right shoulder 01/29/2020   Impotence of organic origin    Left-sided low back pain with left-sided sciatica 01/23/2019   Long term current use of anticoagulant therapy    Mixed hyperlipidemia    slightly   Multiple renal cysts    bilateral   Nephrolithiasis 05/17/2014   Nonischemic cardiomyopathy (HCC)    Osteoarthritis    PAF (paroxysmal atrial fibrillation) (HCC)    followed by cardiology  (takes ASA)   Peripheral vascular disease    RBBB    RBBB (right bundle branch block with left anterior fascicular block) 04/02/2013   Renal oncocytoma of left kidney 06/18/2013   s/p left robotic assited laparoscopic partial nephrectomy 06/18/13   Right ureteral stone    Rotator cuff syndrome of left shoulder 04/17/2016   Status post total left knee replacement 01/01/2020   Tendinopathy of right biceps tendon 01/29/2020   Tendinopathy of right rotator cuff 01/29/2020   Type 2 diabetes mellitus treated with insulin  [facility])    endocrinologist-  dr kassie   Wears glasses    Past Surgical History:  Procedure Laterality Date   A-FLUTTER ABLATION N/A 10/27/2021   Procedure: A-FLUTTER ABLATION;  Surgeon: Cindie Ole DASEN, MD;  Location: [facility] INVASIVE CV LAB;  Service: Cardiovascular;  Laterality: N/A;   CARDIAC CATHETERIZATION   08-17-2011  DR JACQUES TILLEY   POSSIBLE MODERATE LAD STENOSIS JUST AFTER THE BIFURCATION OF LARGE DIAGONAL BRANCH/ LVEF  40-45%/ MILD GLOBAL HYPODINESIS (CATH DONE FOR ABNORMAL STRESS TEST OF FIXED LATERAL WALL DEFECT & BIFASCICULAR BLAOCK)   CARDIOVERSION N/A 04/02/2013   Procedure: CARDIOVERSION;  Surgeon: Elsie GORMAN Somerset, MD;  Location: [facility] OR;  Service: Cardiovascular;  Laterality: N/A;   CARDIOVERSION N/A 12/27/2020   Procedure: CARDIOVERSION;  Surgeon: Raford Riggs, MD;  Location: [facility] ENDOSCOPY;  Service: Cardiovascular;  Laterality: N/A;   CYSTOSCOPY WITH LITHOLAPAXY N/A 05/18/2015  Procedure: CYSTOSCOPY WITH LITHOLAPAXY;  Surgeon: Alm Fragmin, MD;  Location: [facility];  Service: Urology;  Laterality: N/A;   CYSTOSCOPY WITH LITHOLAPAXY N/A 01/04/2017   Procedure: CYSTOSCOPY WITH LITHOLAPAXY, Holmium Laser;  Surgeon: Morene LELON Salines, MD;  Location: [facility];  Service: Urology;  Laterality: N/A;   CYSTOSCOPY WITH RETROGRADE PYELOGRAM, URETEROSCOPY AND STENT PLACEMENT  11/03/2012   Procedure: CYSTOSCOPY WITH RETROGRADE PYELOGRAM, URETEROSCOPY AND STENT PLACEMENT;  Surgeon: Alm GORMAN Fragmin, MD;  Location: [facility];  Service: Urology;  Laterality: Left;  Cystoscopy/Left Retrograde/Ureteroscopy/Holmium Laser Litho/Double J Stent   CYSTOSCOPY WITH URETEROSCOPY, STONE BASKETRY AND STENT PLACEMENT Right 05/15/2019   Procedure: CYSTOSCOPY WITH URETEROSCOPY, LASER LITHOTRIPSY STONE BASKETRY AND STENT PLACEMENT, RIGHT RETROGRADE;  Surgeon: Salines Morene LELON, MD;  Location: [facility];  Service: Urology;  Laterality: Right;   HOLMIUM LASER APPLICATION  11/03/2012   Procedure: HOLMIUM LASER APPLICATION;  Surgeon: Alm GORMAN Fragmin, MD;  Location: [facility];  Service: Urology;  Laterality: Left;   HOLMIUM LASER APPLICATION N/A 05/18/2015   Procedure: HOLMIUM LASER APPLICATION;  Surgeon: Alm Fragmin, MD;  Location: [facility];  Service: Urology;  Laterality: N/A;   KNEE SURGERY Bilateral 1978  &  1974   LYMPHADENECTOMY Bilateral 04/22/2023   Procedure: BILATERAL PELVIC LYMPHADENECTOMY;  Surgeon: Renda Glance, MD;  Location: [facility];  Service: Urology;  Laterality: Bilateral;   MELANOMA EXCISION Bilateral 04/2015   20th-- left leg //  1st-- right leg w/ skin graft from right thigh (no lymph node bx)   NEPHROLITHOTOMY Right 05/17/2014   Procedure: NEPHROLITHOTOMY PERCUTANEOUS;  Surgeon: Alm GORMAN Fragmin, MD;  Location: [facility];  Service: Urology;  Laterality: Right;   PILONIDAL CYST EXCISION     PROSTATE BIOPSY     RIGHT/LEFT HEART CATH AND CORONARY ANGIOGRAPHY N/A 09/27/2023   Procedure: RIGHT/LEFT HEART CATH AND CORONARY ANGIOGRAPHY;  Surgeon: Jordan, Peter M, MD;  Location: [facility] INVASIVE CV LAB;  Service: Cardiovascular;  Laterality: N/A;   ROBOT ASSISTED LAPAROSCOPIC RADICAL PROSTATECTOMY N/A 04/22/2023   Procedure: XI ROBOTIC ASSISTED LAPAROSCOPIC RADICAL PROSTATECTOMY LEVEL 2;  Surgeon: Renda Glance, MD;  Location: [facility];  Service: Urology;  Laterality: N/A;   ROBOTIC ASSITED PARTIAL NEPHRECTOMY Left 06/18/2013   Procedure: ROBOTIC ASSISTED PARTIAL NEPHRECTOMY;  Surgeon: Noretta Renda, MD;  Location: [facility];  Service: Urology;  Laterality: Left;   ROTATOR CUFF REPAIR Left 07/2016   SHOULDER ARTHROSCOPY WITH ROTATOR CUFF REPAIR AND SUBACROMIAL DECOMPRESSION Right 05/27/2020   Procedure: RIGHT SHOULDER ARTHROSCOPY WITH EXTENSIVE DEBRIDEMENT, DISTAL CLAVICLE EXCISION AND SUBACROMIAL DECOMPRESSION;  Surgeon: Jerri Kay HERO, MD;  Location: [facility];  Service: Orthopedics;  Laterality: Right;   TEE WITH CARDIOVERSION  12/27/2020   TEE WITHOUT CARDIOVERSION N/A 12/27/2020   Procedure: TRANSESOPHAGEAL ECHOCARDIOGRAM (TEE);  Surgeon: Raford Riggs, MD;  Location: [facility] ENDOSCOPY;  Service: Cardiovascular;  Laterality: N/A;   TONSILLECTOMY  6  (age 83)   TOTAL KNEE ARTHROPLASTY Left 02/07/2017    Procedure: LEFT TOTAL KNEE ARTHROPLASTY;  Surgeon: Kay HERO Jerri, MD;  Location: [facility] OR;  Service: Orthopedics;  Laterality: Left;   TOTAL KNEE ARTHROPLASTY Right 04/25/2017   Procedure: RIGHT TOTAL KNEE ARTHROPLASTY;  Surgeon: Jerri Kay HERO, MD;  Location: [facility] OR;  Service: Orthopedics;  Laterality: Right;   TOTAL KNEE REVISION Left 07/13/2022   Procedure: LEFT TOTAL KNEE REVISION, TIBIAL COMPONENT;  Surgeon: Jerri Kay HERO, MD;  Location: [facility] OR;  Service: Orthopedics;  Laterality: Left;  TRANSURETHRAL RESECTION OF PROSTATE N/A 01/04/2017   Procedure: TRANSURETHRAL RESECTION OF THE PROSTATE (TURP);  Surgeon: Morene LELON Salines, MD;  Location: [facility];  Service: Urology;  Laterality: N/A;   Social History   Socioeconomic History   Marital status: Married    Spouse name: Not on file   Number of children: 2   Years of education: 16   Highest education level: Not on file  Occupational History   Occupation: Emergency Planning/management Officer, Mining Engineer  Tobacco Use   Smoking status: Former    Current packs/day: 0.00    Average packs/day: 1 pack/day for 6.0 years (6.0 ttl pk-yrs)    Types: Cigarettes    Start date: 11/01/1963    Quit date: 10/31/1969    Years since quitting: 54.9   Smokeless tobacco: Never  Vaping Use   Vaping status: Never Used  Substance and Sexual Activity   Alcohol use: Yes    Alcohol/week: 0.0 standard drinks of alcohol    Comment: OCCASIONAL   Drug use: No   Sexual activity: Never  Other Topics Concern   Not on file  Social History Narrative   Regular exercise-no   Social Drivers of Health   Financial Resource Strain: Not on file  Food Insecurity: No Food Insecurity (04/24/2024)   Hunger Vital Sign    Worried About Running Out of Food in the Last Year: Never true    Ran Out of Food in the Last Year: Never true  Transportation Needs: No Transportation Needs (04/24/2024)   PRAPARE - Administrator, Civil Service (Medical): No    Lack of Transportation  (Non-Medical): No  Physical Activity: Not on file  Stress: Not on file  Social Connections: Not on file  Intimate Partner Violence: Not At Risk (04/24/2024)   Humiliation, Afraid, Rape, and Kick questionnaire    Fear of Current or Ex-Partner: No    Emotionally Abused: No    Physically Abused: No    Sexually Abused: No   Current Outpatient Medications on File Prior to Visit  Medication Sig Dispense Refill   amLODipine  (NORVASC ) 10 MG tablet Take 1 tablet (10 mg total) by mouth daily. 90 tablet 3   aspirin  EC 81 MG tablet Take 1 tablet (81 mg total) by mouth daily. Swallow whole.     dapagliflozin  propanediol (FARXIGA ) 10 MG TABS tablet Take 1 tablet (10 mg total) by mouth daily before breakfast. 90 tablet 3   furosemide  (LASIX ) 40 MG tablet Take 1 tablet (40 mg total) by mouth daily. 90 tablet 3   glucose blood (ONETOUCH ULTRA) test strip Use to check blood sugar 1-2 times a day 200 strip 11   insulin  degludec (TRESIBA  FLEXTOUCH) 200 UNIT/ML FlexTouch Pen Inject 90 Units into the skin daily. 45 mL 2   Insulin  Lispro-aabc (LYUMJEV  KWIKPEN) 200 UNIT/ML KwikPen Inject 6-10 Units into the skin 3 (three) times daily before meals. Inject 6-10 units before meals, under skin (Patient taking differently: Inject 6-10 Units into the skin daily with supper. Inject 6-10 units before meals, under skin) 18 mL 2   Insulin  Pen Needle 32G X 4 MM MISC Use 4x a day 400 each 3   Lancets (ONETOUCH DELICA PLUS LANCET33G) MISC 1 each by Other route 2 (two) times daily. E11.9 200 each 3   metoprolol  succinate (TOPROL -XL) 50 MG 24 hr tablet Take 1 tablet (50 mg total) by mouth daily. Take with or immediately following a meal. 90 tablet 1   nitroGLYCERIN  (NITROSTAT ) 0.4 MG  SL tablet Place 1 tablet (0.4 mg total) under the tongue every 5 (five) minutes x 3 doses as needed for chest pain. (Patient not taking: Reported on 10/02/2024) 25 tablet 1   rosuvastatin  (CRESTOR ) 20 MG tablet Take 1 tablet (20 mg total) by mouth  daily. 90 tablet 3   spironolactone  (ALDACTONE ) 25 MG tablet Take 1 tablet (25 mg total) by mouth daily. 90 tablet 3   trolamine salicylate (ASPERCREME) 10 % cream Apply 1 application  topically 3 (three) times daily as needed for muscle pain.     valsartan  (DIOVAN ) 320 MG tablet Take 1 tablet (320 mg total) by mouth daily. 90 tablet 3   No current facility-administered medications on file prior to visit.   Allergies  Allergen Reactions   Actos [Pioglitazone] Swelling and Cough    SWELLING REACTION UNSPECIFIED  EDEMA   Family History  Problem Relation Age of Onset   Arthritis Mother    Hypertension Mother    Arthritis Father    Hypertension Father    Diabetes Father    Heart attack Brother 56   Diabetes Brother    Cancer Maternal Uncle        unknown type   Prostate cancer Paternal Uncle 2 - 71       metastatic   PE: BP (!) 140/70   Pulse 86   Ht 5' 11 (1.803 m)   Wt 235 lb 9.6 oz (106.9 kg)   SpO2 97%   BMI 32.86 kg/m  Wt Readings from Last 10 Encounters:  10/08/24 235 lb 9.6 oz (106.9 kg)  06/04/24 234 lb (106.1 kg)  04/24/24 230 lb 2 oz (104.4 kg)  03/10/24 239 lb (108.4 kg)  02/19/24 235 lb (106.6 kg)  12/03/23 230 lb 12.8 oz (104.7 kg)  11/27/23 229 lb 3.2 oz (104 kg)  10/02/23 233 lb 9.6 oz (106 kg)  09/27/23 230 lb (104.3 kg)  09/23/23 230 lb 9.6 oz (104.6 kg)  : Constitutional: overweight, in NAD Eyes: no exophthalmos ENT: no thyromegaly, no cervical lymphadenopathy Cardiovascular: RRR, No RG, + 1/6 SEM, + L LE slight swelling Respiratory: CTA B Musculoskeletal: no deformities Skin: + rashes - stasis dermatitis on B legs Neurological: no tremor with outstretched hands Diabetic Foot Exam - Simple   Simple Foot Form Diabetic Foot exam was performed with the following findings: Yes 10/08/2024  9:28 AM  Visual Inspection See comments: Yes Sensation Testing Intact to touch and monofilament testing bilaterally: Yes Pulse Check Posterior Tibialis and  Dorsalis pulse intact bilaterally: Yes Comments + B pitting edema Purplish discoloration B Onychodystrophy throughout    ASSESSMENT: 1. DM2, insulin -dependent, uncontrolled, with complications - CAD - NICMP - mild CKD  No family history of medullary thyroid  cancer or personal history of pancreatitis.  2. HL  3.  Toe ulcer  PLAN:  1. Patient with longstanding, uncontrolled, type 2 diabetes, on oral antidiabetic regimen with SGLT2 inhibitor and also basal-bolus insulin  regimen, with improving control.  At last visit, HbA1c level was 6.5%, significantly improved, from 8.1%.  He was not taking Lyumjev  before every meal, only 6 units before dinner but continued the rest of the regimen as prescribed.  He was only checking blood sugars in the morning and they were mostly at goal but he did have occasional higher blood sugars.  We again discussed about trying to check some blood sugars later in the day, but I could not change his regimen without further data. - at today's visit, he mentions that  he continues to receive iron Farxiga , but he only takes Lyumjev  occasionally, maybe once a week.  He is still not checking sugars midday and at bedtime, only in the morning and seldom before dinner.  In the absence of more data, for now I advised him to continue to take Lyumjev  before larger meals but to try to check some blood sugars at other times of the day, also. - I suggested to:  Patient Instructions  Please use the following regimen: - Farxiga  10 mg before dinner - Tresiba  90 units daily - Lyumjev  6 units before larger dinners  Check some sugars later in the day - midday and bedtime.  Please return in 4 months with your sugar log.   - we checked his HbA1c: 6.4% (lower) - advised to check sugars at different times of the day - 2x a day, rotating check times - advised for yearly eye exams >> he is not UTD - return to clinic in 3-4 months  2. HL - Lipid panel was reviewed from 11/2023: LDL  above our target of less than 55, otherwise fractions at goal: Lab Results  Component Value Date   CHOL 143 12/03/2023   HDL 49 12/03/2023   LDLCALC 78 12/03/2023   TRIG 79 12/03/2023   CHOLHDL 2.9 12/03/2023  - He continues on Crestor  20 mg daily without side effects  3.  Toe ulcer - He previously had a second toe ulcer after hitting his toe several times in the same place.   - He did have good pedal pulses but significant venous stasis in both feet.  His nails were dystrophic and skin was shiny and sanded.  I recommended a podiatry visit ([facility]).  He did not see them... - At today's visit, I do not see any ulcer on his foot exam, but he does have thin skin, purplish discoloration, varicosities, edema, and onychodystrophy.  I again recommended to see a podiatrist.  Component     Latest Ref Rng 10/08/2024  Creatinine, Urine     20 - 320 mg/dL 94   Microalb, Ur     mg/dL 78.0   MICROALB/CREAT RATIO     <30 mg/g creat 233 (H)   ACR is elevated.  Due to his urinary incontinence, I am reticent to start an SGLT2 inhibitor for him now.  Will continue Entresto  for now and recheck this at next visit.  Lela Fendt, MD PhD [facility] Endocr[facility]

## 2024-10-09 ENCOUNTER — Ambulatory Visit: Payer: Self-pay | Admitting: Internal Medicine

## 2024-10-09 LAB — MICROALBUMIN / CREATININE URINE RATIO
Creatinine, Urine: 94 mg/dL (ref 20–320)
Microalb Creat Ratio: 233 mg/g{creat} — ABNORMAL HIGH (ref <30)
Microalb, Ur: 21.9 mg/dL

## 2024-11-10 ENCOUNTER — Ambulatory Visit: Admitting: Pharmacist

## 2025-02-11 ENCOUNTER — Ambulatory Visit: Admitting: Internal Medicine

## 2025-07-14 ENCOUNTER — Ambulatory Visit: Admitting: Orthopaedic Surgery
